# Patient Record
Sex: Female | Born: 1950 | Race: Black or African American | Hispanic: No | Marital: Married | Smoking: Current every day smoker
Health system: Southern US, Community
[De-identification: ages and names within clinical notes are randomized; demographics above are authoritative.]

## PROBLEM LIST (undated history)

## (undated) DIAGNOSIS — I10 Essential (primary) hypertension: Secondary | ICD-10-CM

## (undated) DIAGNOSIS — R0600 Dyspnea, unspecified: Secondary | ICD-10-CM

## (undated) DIAGNOSIS — C801 Malignant (primary) neoplasm, unspecified: Secondary | ICD-10-CM

## (undated) DIAGNOSIS — J189 Pneumonia, unspecified organism: Secondary | ICD-10-CM

## (undated) DIAGNOSIS — K219 Gastro-esophageal reflux disease without esophagitis: Secondary | ICD-10-CM

## (undated) DIAGNOSIS — R87619 Unspecified abnormal cytological findings in specimens from cervix uteri: Secondary | ICD-10-CM

## (undated) DIAGNOSIS — N840 Polyp of corpus uteri: Secondary | ICD-10-CM

## (undated) DIAGNOSIS — N879 Dysplasia of cervix uteri, unspecified: Secondary | ICD-10-CM

## (undated) DIAGNOSIS — E785 Hyperlipidemia, unspecified: Secondary | ICD-10-CM

## (undated) DIAGNOSIS — D649 Anemia, unspecified: Secondary | ICD-10-CM

## (undated) DIAGNOSIS — IMO0002 Reserved for concepts with insufficient information to code with codable children: Secondary | ICD-10-CM

## (undated) DIAGNOSIS — M1990 Unspecified osteoarthritis, unspecified site: Secondary | ICD-10-CM

## (undated) DIAGNOSIS — E669 Obesity, unspecified: Secondary | ICD-10-CM

## (undated) DIAGNOSIS — I219 Acute myocardial infarction, unspecified: Secondary | ICD-10-CM

## (undated) DIAGNOSIS — I509 Heart failure, unspecified: Secondary | ICD-10-CM

## (undated) DIAGNOSIS — N189 Chronic kidney disease, unspecified: Secondary | ICD-10-CM

## (undated) HISTORY — DX: Obesity, unspecified: E66.9

## (undated) HISTORY — DX: Essential (primary) hypertension: I10

## (undated) HISTORY — DX: Reserved for concepts with insufficient information to code with codable children: IMO0002

## (undated) HISTORY — PX: DILATION AND CURETTAGE OF UTERUS: SHX78

## (undated) HISTORY — DX: Chronic kidney disease, unspecified: N18.9

## (undated) HISTORY — PX: LEEP: SHX91

## (undated) HISTORY — DX: Dysplasia of cervix uteri, unspecified: N87.9

## (undated) HISTORY — DX: Polyp of corpus uteri: N84.0

## (undated) HISTORY — PX: HYSTEROSCOPY: SHX211

## (undated) HISTORY — DX: Unspecified abnormal cytological findings in specimens from cervix uteri: R87.619

---

## 1999-02-08 ENCOUNTER — Encounter: Payer: Self-pay | Admitting: Emergency Medicine

## 1999-02-08 ENCOUNTER — Emergency Department (HOSPITAL_COMMUNITY): Admission: EM | Admit: 1999-02-08 | Discharge: 1999-02-08 | Payer: Self-pay | Admitting: Emergency Medicine

## 2003-04-13 ENCOUNTER — Encounter: Admission: RE | Admit: 2003-04-13 | Discharge: 2003-04-13 | Payer: Self-pay | Admitting: Nephrology

## 2004-03-22 ENCOUNTER — Ambulatory Visit (HOSPITAL_COMMUNITY): Admission: RE | Admit: 2004-03-22 | Discharge: 2004-03-22 | Payer: Self-pay | Admitting: Cardiology

## 2004-03-22 ENCOUNTER — Encounter (INDEPENDENT_AMBULATORY_CARE_PROVIDER_SITE_OTHER): Payer: Self-pay | Admitting: Cardiology

## 2004-03-25 ENCOUNTER — Ambulatory Visit (HOSPITAL_COMMUNITY): Admission: RE | Admit: 2004-03-25 | Discharge: 2004-03-25 | Payer: Self-pay | Admitting: Cardiology

## 2004-08-29 ENCOUNTER — Ambulatory Visit (HOSPITAL_COMMUNITY): Admission: RE | Admit: 2004-08-29 | Discharge: 2004-08-29 | Payer: Self-pay | Admitting: Nephrology

## 2006-10-31 ENCOUNTER — Ambulatory Visit: Payer: Self-pay | Admitting: Family Medicine

## 2006-10-31 LAB — CONVERTED CEMR LAB
AST: 14 units/L (ref 0–37)
Albumin: 4.2 g/dL (ref 3.5–5.2)
Basophils Relative: 0 % (ref 0–1)
CO2: 18 meq/L — ABNORMAL LOW (ref 19–32)
Calcium: 9.7 mg/dL (ref 8.4–10.5)
Eosinophils Absolute: 0.1 10*3/uL (ref 0.0–0.7)
Ferritin: 180 ng/mL (ref 10–291)
Glucose, Bld: 94 mg/dL (ref 70–99)
HCT: 42.2 % (ref 36.0–46.0)
HDL: 49 mg/dL (ref >39)
Lymphs Abs: 2 10*3/uL (ref 0.7–3.3)
Monocytes Relative: 6 % (ref 3–11)
Neutrophils Relative %: 62 % (ref 43–77)
Potassium: 5.8 meq/L — ABNORMAL HIGH (ref 3.5–5.3)
TIBC: 297 ug/dL (ref 250–470)
Total Bilirubin: 0.5 mg/dL (ref 0.3–1.2)
Total CHOL/HDL Ratio: 4.5
Triglycerides: 135 mg/dL (ref <150)

## 2006-11-02 ENCOUNTER — Ambulatory Visit (HOSPITAL_COMMUNITY): Admission: RE | Admit: 2006-11-02 | Discharge: 2006-11-02 | Payer: Self-pay | Admitting: Family Medicine

## 2006-11-13 ENCOUNTER — Ambulatory Visit: Payer: Self-pay | Admitting: Family Medicine

## 2006-11-13 LAB — CONVERTED CEMR LAB
Albumin: 3.8 g/dL (ref 3.5–5.2)
BUN: 46 mg/dL — ABNORMAL HIGH (ref 6–23)
Calcium: 9.2 mg/dL (ref 8.4–10.5)
Chloride: 107 meq/L (ref 96–112)
Phosphorus: 4.3 mg/dL (ref 2.3–4.6)
Potassium: 4.6 meq/L (ref 3.5–5.3)

## 2007-02-22 ENCOUNTER — Ambulatory Visit: Payer: Self-pay | Admitting: Family Medicine

## 2007-02-22 ENCOUNTER — Encounter (INDEPENDENT_AMBULATORY_CARE_PROVIDER_SITE_OTHER): Payer: Self-pay | Admitting: Family Medicine

## 2007-03-14 ENCOUNTER — Ambulatory Visit: Payer: Self-pay | Admitting: Internal Medicine

## 2007-03-14 ENCOUNTER — Ambulatory Visit: Payer: Self-pay | Admitting: *Deleted

## 2007-03-14 ENCOUNTER — Encounter (INDEPENDENT_AMBULATORY_CARE_PROVIDER_SITE_OTHER): Payer: Self-pay | Admitting: Family Medicine

## 2007-03-14 LAB — CONVERTED CEMR LAB
Albumin: 3.9 g/dL (ref 3.5–5.2)
BUN: 56 mg/dL — ABNORMAL HIGH (ref 6–23)
CO2: 21 meq/L (ref 19–32)
Calcium, Total (PTH): 9.1 mg/dL (ref 8.4–10.5)
Creatinine, Ser: 2.37 mg/dL — ABNORMAL HIGH (ref 0.40–1.20)
HDL: 53 mg/dL (ref >39)
Hemoglobin: 12.8 g/dL (ref 12.0–15.0)
LDL Cholesterol: 129 mg/dL — ABNORMAL HIGH (ref 0–99)
Lymphocytes Relative: 31 % (ref 12–46)
MCHC: 32.2 g/dL (ref 30.0–36.0)
MCV: 94.3 fL (ref 78.0–100.0)
Platelets: 265 10*3/uL (ref 150–400)
RBC: 4.21 M/uL (ref 3.87–5.11)
RDW: 15.2 % (ref 11.5–15.5)

## 2007-04-09 ENCOUNTER — Ambulatory Visit: Payer: Self-pay | Admitting: Family Medicine

## 2007-09-06 ENCOUNTER — Ambulatory Visit: Payer: Self-pay | Admitting: Vascular Surgery

## 2007-10-10 ENCOUNTER — Ambulatory Visit: Payer: Self-pay | Admitting: Vascular Surgery

## 2007-11-07 ENCOUNTER — Ambulatory Visit: Payer: Self-pay | Admitting: Internal Medicine

## 2007-11-07 ENCOUNTER — Encounter (INDEPENDENT_AMBULATORY_CARE_PROVIDER_SITE_OTHER): Payer: Self-pay | Admitting: Family Medicine

## 2007-11-07 LAB — CONVERTED CEMR LAB
Albumin: 3.9 g/dL (ref 3.5–5.2)
BUN: 37 mg/dL — ABNORMAL HIGH (ref 6–23)
Basophils Absolute: 0 10*3/uL (ref 0.0–0.1)
Basophils Relative: 0 % (ref 0–1)
Calcium, Total (PTH): 9.3 mg/dL (ref 8.4–10.5)
Eosinophils Relative: 1 % (ref 0–5)
HCT: 40.2 % (ref 36.0–46.0)
Iron: 50 ug/dL (ref 42–145)
Lymphocytes Relative: 26 % (ref 12–46)
Lymphs Abs: 1.3 10*3/uL (ref 0.7–4.0)
MCHC: 33.3 g/dL (ref 30.0–36.0)
MCV: 94.4 fL (ref 78.0–100.0)
Monocytes Relative: 12 % (ref 3–12)
Neutro Abs: 3.1 10*3/uL (ref 1.7–7.7)
Neutrophils Relative %: 61 % (ref 43–77)
Potassium: 4.6 meq/L (ref 3.5–5.3)
RBC: 4.26 M/uL (ref 3.87–5.11)
RDW: 14.8 % (ref 11.5–15.5)
Saturation Ratios: 18 % — ABNORMAL LOW (ref 20–55)
Sodium: 137 meq/L (ref 135–145)

## 2008-03-10 ENCOUNTER — Ambulatory Visit: Payer: Self-pay | Admitting: Internal Medicine

## 2008-04-21 ENCOUNTER — Encounter (INDEPENDENT_AMBULATORY_CARE_PROVIDER_SITE_OTHER): Payer: Self-pay | Admitting: Family Medicine

## 2008-04-21 ENCOUNTER — Ambulatory Visit: Payer: Self-pay | Admitting: Internal Medicine

## 2008-04-21 LAB — CONVERTED CEMR LAB
Albumin: 3.8 g/dL (ref 3.5–5.2)
BUN: 36 mg/dL — ABNORMAL HIGH (ref 6–23)
CO2: 17 meq/L — ABNORMAL LOW (ref 19–32)
Creatinine, Ser: 2.28 mg/dL — ABNORMAL HIGH (ref 0.40–1.20)
Iron: 76 ug/dL (ref 42–145)
Phosphorus: 3.6 mg/dL (ref 2.3–4.6)
Sodium: 140 meq/L (ref 135–145)

## 2008-04-23 ENCOUNTER — Ambulatory Visit: Payer: Self-pay | Admitting: Family Medicine

## 2008-04-23 ENCOUNTER — Encounter (INDEPENDENT_AMBULATORY_CARE_PROVIDER_SITE_OTHER): Payer: Self-pay | Admitting: Family Medicine

## 2008-04-30 ENCOUNTER — Ambulatory Visit (HOSPITAL_COMMUNITY): Admission: RE | Admit: 2008-04-30 | Discharge: 2008-04-30 | Payer: Self-pay | Admitting: Family Medicine

## 2008-07-24 ENCOUNTER — Encounter (INDEPENDENT_AMBULATORY_CARE_PROVIDER_SITE_OTHER): Payer: Self-pay | Admitting: Family Medicine

## 2008-07-24 ENCOUNTER — Ambulatory Visit: Payer: Self-pay | Admitting: Family Medicine

## 2008-07-29 ENCOUNTER — Ambulatory Visit: Payer: Self-pay | Admitting: Family Medicine

## 2008-09-29 ENCOUNTER — Ambulatory Visit: Payer: Self-pay | Admitting: Internal Medicine

## 2008-10-08 ENCOUNTER — Ambulatory Visit: Payer: Self-pay | Admitting: Family Medicine

## 2008-10-08 LAB — CONVERTED CEMR LAB
Calcium, Total (PTH): 9.2 mg/dL (ref 8.4–10.5)
Calcium: 9.2 mg/dL (ref 8.4–10.5)
Chloride: 110 meq/L (ref 96–112)
Creatinine, Ser: 2.75 mg/dL — ABNORMAL HIGH (ref 0.40–1.20)
Ferritin: 110 ng/mL (ref 10–291)
HCT: 38.8 % (ref 36.0–46.0)
Hemoglobin: 12.6 g/dL (ref 12.0–15.0)
PTH: 239.2 pg/mL — ABNORMAL HIGH (ref 14.0–72.0)
Saturation Ratios: 29 % (ref 20–55)
UIBC: 191 ug/dL

## 2008-10-14 ENCOUNTER — Ambulatory Visit: Payer: Self-pay | Admitting: Obstetrics and Gynecology

## 2008-10-14 ENCOUNTER — Other Ambulatory Visit: Admission: RE | Admit: 2008-10-14 | Discharge: 2008-10-14 | Payer: Self-pay | Admitting: Obstetrics and Gynecology

## 2008-11-11 ENCOUNTER — Encounter: Payer: Self-pay | Admitting: Obstetrics and Gynecology

## 2008-11-11 ENCOUNTER — Ambulatory Visit: Payer: Self-pay | Admitting: Obstetrics and Gynecology

## 2008-12-02 ENCOUNTER — Ambulatory Visit: Payer: Self-pay | Admitting: Obstetrics and Gynecology

## 2008-12-24 ENCOUNTER — Ambulatory Visit: Payer: Self-pay | Admitting: Obstetrics and Gynecology

## 2008-12-24 ENCOUNTER — Other Ambulatory Visit: Admission: RE | Admit: 2008-12-24 | Discharge: 2008-12-24 | Payer: Self-pay | Admitting: Obstetrics and Gynecology

## 2009-01-05 ENCOUNTER — Ambulatory Visit: Payer: Self-pay | Admitting: Family Medicine

## 2009-01-05 LAB — CONVERTED CEMR LAB
Albumin: 4.6 g/dL (ref 3.5–5.2)
Chloride: 106 meq/L (ref 96–112)
Creatinine, Ser: 3.49 mg/dL — ABNORMAL HIGH (ref 0.40–1.20)
Glucose, Bld: 110 mg/dL — ABNORMAL HIGH (ref 70–99)
HCT: 40.2 % (ref 36.0–46.0)
Iron: 94 ug/dL (ref 42–145)
Potassium: 5.6 meq/L — ABNORMAL HIGH (ref 3.5–5.3)
TIBC: 305 ug/dL (ref 250–470)
UIBC: 211 ug/dL

## 2009-01-13 ENCOUNTER — Ambulatory Visit: Payer: Self-pay | Admitting: Obstetrics & Gynecology

## 2009-02-09 ENCOUNTER — Ambulatory Visit: Payer: Self-pay | Admitting: Internal Medicine

## 2009-02-09 ENCOUNTER — Encounter (INDEPENDENT_AMBULATORY_CARE_PROVIDER_SITE_OTHER): Payer: Self-pay | Admitting: Family Medicine

## 2009-02-09 LAB — CONVERTED CEMR LAB
BUN: 59 mg/dL — ABNORMAL HIGH (ref 6–23)
Calcium: 9.3 mg/dL (ref 8.4–10.5)
Potassium: 4.2 meq/L (ref 3.5–5.3)
Sodium: 138 meq/L (ref 135–145)

## 2009-04-14 ENCOUNTER — Ambulatory Visit: Payer: Self-pay | Admitting: Obstetrics & Gynecology

## 2009-05-05 ENCOUNTER — Ambulatory Visit: Payer: Self-pay | Admitting: Family Medicine

## 2009-05-05 LAB — CONVERTED CEMR LAB
Albumin: 4.1 g/dL (ref 3.5–5.2)
Calcium, Total (PTH): 9.4 mg/dL (ref 8.4–10.5)
Calcium: 9.4 mg/dL (ref 8.4–10.5)
Ferritin: 174 ng/mL (ref 10–291)
Iron: 86 ug/dL (ref 42–145)
Phosphorus: 3.9 mg/dL (ref 2.3–4.6)
Potassium: 4.7 meq/L (ref 3.5–5.3)
Saturation Ratios: 32 % (ref 20–55)

## 2009-08-26 ENCOUNTER — Other Ambulatory Visit: Admission: RE | Admit: 2009-08-26 | Discharge: 2009-08-26 | Payer: Self-pay | Admitting: Obstetrics and Gynecology

## 2009-08-26 ENCOUNTER — Ambulatory Visit: Payer: Self-pay | Admitting: Obstetrics & Gynecology

## 2009-08-31 ENCOUNTER — Ambulatory Visit (HOSPITAL_COMMUNITY): Admission: RE | Admit: 2009-08-31 | Discharge: 2009-08-31 | Payer: Self-pay | Admitting: Family Medicine

## 2009-09-14 ENCOUNTER — Ambulatory Visit: Payer: Self-pay | Admitting: Family Medicine

## 2009-09-14 LAB — CONVERTED CEMR LAB
BUN: 52 mg/dL — ABNORMAL HIGH (ref 6–23)
CO2: 19 meq/L (ref 19–32)
Calcium, Total (PTH): 9.4 mg/dL (ref 8.4–10.5)
Calcium: 9.4 mg/dL (ref 8.4–10.5)
Creatinine, Ser: 2.7 mg/dL — ABNORMAL HIGH (ref 0.40–1.20)
PTH: 216 pg/mL — ABNORMAL HIGH (ref 14.0–72.0)
Saturation Ratios: 28 % (ref 20–55)
TIBC: 258 ug/dL (ref 250–470)

## 2009-10-20 ENCOUNTER — Ambulatory Visit: Payer: Self-pay | Admitting: Obstetrics and Gynecology

## 2009-10-22 ENCOUNTER — Ambulatory Visit: Payer: Self-pay | Admitting: Family Medicine

## 2009-10-22 LAB — CONVERTED CEMR LAB
Albumin: 3.8 g/dL (ref 3.5–5.2)
Basophils Absolute: 0 10*3/uL (ref 0.0–0.1)
Chloride: 107 meq/L (ref 96–112)
Creatinine, Ser: 2.53 mg/dL — ABNORMAL HIGH (ref 0.40–1.20)
Creatinine, Urine: 171.8 mg/dL
Ferritin: 118 ng/mL (ref 10–291)
HCT: 39.6 % (ref 36.0–46.0)
Lymphocytes Relative: 29 % (ref 12–46)
Monocytes Absolute: 0.4 10*3/uL (ref 0.1–1.0)
Neutro Abs: 4.9 10*3/uL (ref 1.7–7.7)
PTH: 148.9 pg/mL — ABNORMAL HIGH (ref 14.0–72.0)
Platelets: 256 10*3/uL (ref 150–400)
Potassium: 4.1 meq/L (ref 3.5–5.3)
RBC: 4.24 M/uL (ref 3.87–5.11)
Saturation Ratios: 16 % — ABNORMAL LOW (ref 20–55)
Sodium: 139 meq/L (ref 135–145)
UIBC: 228 ug/dL
WBC: 7.7 10*3/uL (ref 4.0–10.5)

## 2009-11-05 ENCOUNTER — Ambulatory Visit: Payer: Self-pay | Admitting: Obstetrics & Gynecology

## 2009-11-05 ENCOUNTER — Ambulatory Visit (HOSPITAL_COMMUNITY)
Admission: RE | Admit: 2009-11-05 | Discharge: 2009-11-05 | Payer: Self-pay | Source: Home / Self Care | Admitting: Obstetrics & Gynecology

## 2010-01-20 ENCOUNTER — Encounter (INDEPENDENT_AMBULATORY_CARE_PROVIDER_SITE_OTHER): Payer: Self-pay | Admitting: Family Medicine

## 2010-01-20 LAB — CONVERTED CEMR LAB
ALT: 11 units/L (ref 0–35)
Chloride: 105 meq/L (ref 96–112)
Creatinine, Ser: 2.39 mg/dL — ABNORMAL HIGH (ref 0.40–1.20)
Eosinophils Absolute: 0.1 10*3/uL (ref 0.0–0.7)
Eosinophils Relative: 2 % (ref 0–5)
Ferritin: 133 ng/mL (ref 10–291)
Glucose, Bld: 171 mg/dL — ABNORMAL HIGH (ref 70–99)
Hemoglobin: 13 g/dL (ref 12.0–15.0)
Lymphocytes Relative: 28 % (ref 12–46)
MCV: 92.7 fL (ref 78.0–100.0)
Monocytes Absolute: 0.4 10*3/uL (ref 0.1–1.0)
Neutrophils Relative %: 63 % (ref 43–77)
Phosphorus: 3.4 mg/dL (ref 2.3–4.6)
Platelets: 292 10*3/uL (ref 150–400)
RBC: 4.36 M/uL (ref 3.87–5.11)
RDW: 15.1 % (ref 11.5–15.5)
Sodium: 141 meq/L (ref 135–145)
Total Protein, Urine: 144
WBC: 5.9 10*3/uL (ref 4.0–10.5)

## 2010-03-02 ENCOUNTER — Encounter: Payer: Self-pay | Admitting: Gastroenterology

## 2010-03-02 ENCOUNTER — Ambulatory Visit
Admission: RE | Admit: 2010-03-02 | Discharge: 2010-03-02 | Payer: Self-pay | Source: Home / Self Care | Attending: Obstetrics & Gynecology | Admitting: Obstetrics & Gynecology

## 2010-03-02 ENCOUNTER — Other Ambulatory Visit: Payer: Self-pay | Admitting: Obstetrics and Gynecology

## 2010-03-03 ENCOUNTER — Ambulatory Visit (HOSPITAL_COMMUNITY)
Admission: RE | Admit: 2010-03-03 | Discharge: 2010-03-03 | Payer: Self-pay | Source: Home / Self Care | Attending: Obstetrics & Gynecology | Admitting: Obstetrics & Gynecology

## 2010-03-17 NOTE — Letter (Signed)
Summary: New Patient letter  [facility]  [address] [place], [place] [address]   Phone: [phone]  Fax: [phone]       03/02/2010 MRN: [id number]  Jessica Clarke [address] [place], [place]  [address]  Dear Ms. [name],  Welcome to the Gastroenterology Division at [facility].    You are scheduled to see Dr.  Deatra Ina on 04-08-10 at 1:30pm on the 3rd floor at [facility], [place] [employer].  We ask that you try to arrive at our office 15 minutes prior to your appointment time to allow for check-in.  We would like you to complete the enclosed self-administered evaluation form prior to your visit and bring it with you on the day of your appointment.  We will review it with you.  Also, please bring a complete list of all your medications or, if you prefer, bring the medication bottles and we will list them.  Please bring your insurance card so that we may make a copy of it.  If your insurance requires a referral to see a specialist, please bring your referral form from your primary care physician.  Co-payments are due at the time of your visit and may be paid by cash, check or credit card.     Your office visit will consist of a consult with your physician (includes a physical exam), any laboratory testing he/she may order, scheduling of any necessary diagnostic testing (e.g. x-ray, ultrasound, CT-scan), and scheduling of a procedure (e.g. Endoscopy, Colonoscopy) if required.  Please allow enough time on your schedule to allow for any/all of these possibilities.    If you cannot keep your appointment, please call [phone] to cancel or reschedule prior to your appointment date.  This allows [place] the opportunity to schedule an appointment for another patient in need of care.  If you do not cancel or reschedule by 5 p.m. the business day prior to your appointment date, you will be charged a $50.00 late cancellation/no-show fee.    Thank you for choosing [facility] for your medical needs.  We appreciate the opportunity to care for you.  Please visit [place] at our website  to learn more about our practice.                     Sincerely,                                                             The Gastroenterology Division

## 2010-03-17 NOTE — Progress Notes (Unsigned)
NAME:  Jessica Clarke, Jessica Clarke NO.:  [id number]  MEDICAL RECORD NO.:  [id number]          PATIENT TYPE:  WOC  LOCATION:  [facility]                   FACILITY:  [facility]  PHYSICIAN:  Andrew Au, MD        DATE OF BIRTH:  20-May-1950  DATE OF SERVICE:  03/02/2010                                 CLINIC NOTE  The patient is a 59 year old female who underwent a cervical cone removal of the uterine polyp and hysteroscopy with a D and C in September 2011.  This is the first time she is back for her postop visit.  She has no complaints at the present time and postoperative exam was normal for pathology, so Pap smear was repeated which was negative. There were no endocervical glands, but that is not unusual 4 months after a conization.  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  We are going to repeat the Pap smear again in 6 months.  IMPRESSION:  Postop exam satisfactory.          ______________________________ Andrew Au, MD    PR/MEDQ  D:  03/16/2010  T:  03/16/2010  Job:  [id number]

## 2010-04-08 ENCOUNTER — Ambulatory Visit: Payer: Self-pay | Admitting: Gastroenterology

## 2010-04-28 LAB — CBC
MCH: 31.2 pg (ref 26.0–34.0)
MCHC: 33.8 g/dL (ref 30.0–36.0)
Platelets: 301 10*3/uL (ref 150–400)
RBC: 4.14 MIL/uL (ref 3.87–5.11)
RDW: 14.3 % (ref 11.5–15.5)

## 2010-04-28 LAB — BASIC METABOLIC PANEL
CO2: 22 mEq/L (ref 19–32)
Creatinine, Ser: 2.72 mg/dL — ABNORMAL HIGH (ref 0.4–1.2)
GFR calc Af Amer: 22 mL/min — ABNORMAL LOW (ref >60)
Potassium: 4.5 mEq/L (ref 3.5–5.1)
Sodium: 137 mEq/L (ref 135–145)

## 2010-05-09 ENCOUNTER — Ambulatory Visit (INDEPENDENT_AMBULATORY_CARE_PROVIDER_SITE_OTHER): Payer: Medicaid Other | Admitting: Gastroenterology

## 2010-05-09 ENCOUNTER — Encounter: Payer: Self-pay | Admitting: Gastroenterology

## 2010-05-09 DIAGNOSIS — K5909 Other constipation: Secondary | ICD-10-CM

## 2010-05-09 DIAGNOSIS — K5900 Constipation, unspecified: Secondary | ICD-10-CM | POA: Insufficient documentation

## 2010-05-09 DIAGNOSIS — R198 Other specified symptoms and signs involving the digestive system and abdomen: Secondary | ICD-10-CM

## 2010-05-09 MED ORDER — PEG-KCL-NACL-NASULF-NA ASC-C 100 G PO SOLR: 1.0000 | Freq: Once | ORAL | Status: AC

## 2010-05-09 NOTE — Assessment & Plan Note (Signed)
Patient has relatively new onset constipation. This is most likely functional. A structural abnormality of the colon should be ruled out.  Recommendations #1 colonoscopy  Risks, alternatives, and complications of the procedure, including bleeding, perforation, and possible need for surgery, were explained to the patient.  Patient's questions were answered.

## 2010-05-09 NOTE — Patient Instructions (Signed)
Colonoscopy A colonoscopy is an exam to evaluate your entire colon. In this exam, your colon is cleansed. A long fiberoptic tube is inserted through your rectum and into your colon. The fiberoptic scope (endoscope) is a long bundle of enclosed and very flexible fibers. These fibers transmit light to the area examined and send images from that area to your caregiver. Discomfort is usually minimal. You may be given a drug to help you sleep (sedative) during or prior to the procedure. This exam helps to detect lumps (tumors), polyps, inflammation, and areas of bleeding. Your caregiver may also take a small piece of tissue (biopsy) that will be examined under a microscope. BEFORE THE PROCEDURE  A clear liquid diet may be required for 2 days before the exam.   Liquid injections (enemas) or laxatives may be required.   A large amount of electrolyte solution may be given to you to drink over a short period of time. This solution is used to clean out your colon.   You should be present 1 prior to your procedure or as directed by your caregiver.   Check in at the admissions desk to fill out necessary forms if not preregistered. There will be consent forms to sign prior to the procedure. If accompanied by friends or family, there is a waiting area for them while you are having your procedure.  LET YOUR CAREGIVER KNOW ABOUT:  Allergies to food or medicine.  Medicines taken, including vitamins, herbs, eyedrops, over-the-counter medicines, and creams.   Use of steroids (by mouth or creams).   Previous problems with anesthetics or numbing medicines.   History of bleeding problems or blood clots.  Previous surgery.   Other health problems, including diabetes and kidney problems.   Possibility of pregnancy, if this applies.   AFTER THE PROCEDURE  If you received a sedative and/or pain medicine, you will need to arrange for someone to drive you home.   Occasionally, there is a little blood passed  with the first bowel movement. DO NOT be concerned.  HOME CARE INSTRUCTIONS  It is not unusual to pass moderate amounts of gas and experience mild abdominal cramping following the procedure. This is due to air being used to inflate your colon during the exam. Walking or a warm pack on your belly (abdomen) may help.   You may resume all normal meals and activities after sedatives and medicines have worn off.   Only take over-the-counter or prescription medicines for pain, discomfort, or fever as directed by your caregiver. DO NOT use aspirin or blood thinners if a biopsy was taken. Consult your caregiver for medicine usage if biopsies were taken.  FINDING OUT THE RESULTS OF YOUR TEST Not all test results are available during your visit. If your test results are not back during the visit, make an appointment with your caregiver to find out the results. Do not assume everything is normal if you have not heard from your caregiver or the medical facility. It is important for you to follow up on all of your test results. SEEK IMMEDIATE MEDICAL CARE IF: You pass large blood clots or fill a toilet with blood following the procedure. This may also occur 10 to 14 days following the procedure. This is more likely if a biopsy was taken.   You develop abdominal pain that keeps getting worse and cannot be relieved with medicine.  Document Released: 01/28/2000 Document Re-Released: 04/26/2009 [facility] Patient Information 2011 [place]. Your Colonoscopy is scheduled on 05/24/2010 at 11:30am  You can pick up your MoviPrep from your pharmacy today

## 2010-05-09 NOTE — Progress Notes (Signed)
History of Present Illness:  Jessica Clarke  Is a 60 year old Afro-American female referred at the request of Dr. Leward Quan for evaluation of change in bowel habits. For the past year she's been complaining of constipation. She typically has a bowel movement once every 3 days compared to daily bowel movements previously. She has abdominal bloating.  The patient also complains of occasional pyrosis.    Review of Systems:  She complains of lower back pain. Pertinent positive and negative review of systems were noted in the above HPI section. All other review of systems were otherwise negative.    Current Medications, Allergies, Past Medical History, Past Surgical History, Family History and Social History were reviewed in [facility] record  Vital signs were reviewed in today's medical record. Physical Exam: General: Well developed , well nourished, no acute distress Head: Normocephalic and atraumatic Eyes:  sclerae anicteric, EOMI Ears: Normal auditory acuity Mouth: No deformity or lesions Lungs: Clear throughout to auscultation Heart: Regular rate and rhythm; no murmurs, rubs or bruits Abdomen: Soft, non tender and non distended. No masses, hepatosplenomegaly or hernias noted. Normal Bowel sounds Rectal:deferred Musculoskeletal: Symmetrical with no gross deformities  Pulses:  Normal pulses noted Extremities: No clubbing, cyanosis, edema or deformities noted Neurological: Alert oriented x 4, grossly nonfocal Psychological:  Alert and cooperative. Normal mood and affect

## 2010-05-19 NOTE — Progress Notes (Signed)
Addended by: Erskine Emery MD on: 05/19/2010 10:39 AM   Modules accepted: Orders

## 2010-05-24 ENCOUNTER — Ambulatory Visit (AMBULATORY_SURGERY_CENTER): Payer: Medicaid Other | Admitting: Gastroenterology

## 2010-05-24 ENCOUNTER — Encounter: Payer: Self-pay | Admitting: Gastroenterology

## 2010-05-24 DIAGNOSIS — D1779 Benign lipomatous neoplasm of other sites: Secondary | ICD-10-CM

## 2010-05-24 DIAGNOSIS — K620 Anal polyp: Secondary | ICD-10-CM

## 2010-05-24 DIAGNOSIS — R198 Other specified symptoms and signs involving the digestive system and abdomen: Secondary | ICD-10-CM

## 2010-05-24 DIAGNOSIS — D126 Benign neoplasm of colon, unspecified: Secondary | ICD-10-CM

## 2010-05-24 DIAGNOSIS — K5730 Diverticulosis of large intestine without perforation or abscess without bleeding: Secondary | ICD-10-CM

## 2010-05-24 MED ORDER — SODIUM CHLORIDE 0.9 % IV SOLN: 500.0000 mL | INTRAVENOUS | Status: DC

## 2010-05-24 NOTE — Patient Instructions (Signed)
Polyps, Colon  A polyp is extra tissue that grows inside your body. Colon polyps grow in the large intestine. The large intestine, also called the colon, is part of your digestive system. It is a long, hollow tube at the end of your digestive tract where your body makes and stores stool. Most polyps are not dangerous. They are benign. This means they are not cancerous. But over time, some types of polyps can turn into cancer. Polyps that are smaller than a pea are usually not harmful. But larger polyps could someday become or may already be cancerous. To be safe, doctors remove all polyps and test them.  WHO GETS POLYPS? Anyone can get polyps, but certain people are more likely than others. You may have a greater chance of getting polyps if:  You are over 50.   You have had polyps before.   Someone in your family has had polyps.   Someone in your family has had cancer of the large intestine.   Find out if someone in your family has had polyps. You may also be more likely to get polyps if you:   Eat a lot of fatty foods   Smoke   Drink alcohol   Do not exercise  Eat too much  SYMPTOMS Most small polyps do not cause symptoms. People often do not know they have one until their caregiver finds it during a regular checkup or while testing them for something else. Some people do have symptoms like these:  Bleeding from the anus. You might notice blood on your underwear or on toilet paper after you have had a bowel movement.   Constipation or diarrhea that lasts more than a week.   Blood in the stool. Blood can make stool look black or it can show up as red streaks in the stool.  If you have any of these symptoms, see your caregiver. HOW DOES THE DOCTOR TEST FOR POLYPS? The doctor can use four tests to check for polyps:  Digital rectal exam. The caregiver wears gloves and checks your rectum (the last part of the large intestine) to see if it feels normal. This test would find polyps only  in the rectum. Your caregiver may need to do one of the other tests listed below to find polyps higher up in the intestine.   Barium enema. The caregiver puts a liquid called barium into your rectum before taking x-rays of your large intestine. Barium makes your intestine look white in the pictures. Polyps are dark, so they are easy to see.   Sigmoidoscopy. With this test, the caregiver can see inside your large intestine. A thin flexible tube is placed into your rectum. The device is called a sigmoidoscope, which has a light and a tiny video camera in it. The caregiver uses the sigmoidoscope to look at the last third of your large intestine.   Colonoscopy. This test is like sigmoidoscopy, but the caregiver looks at all of the large intestine. It usually requires sedation. This is the most common method for finding and removing polyps.  TREATMENT  The caregiver will remove the polyp during sigmoidoscopy or colonoscopy. The polyp is then tested for cancer.   If you have had polyps, your caregiver may want you to get tested regularly in the future.  PREVENTION There is not one sure way to prevent polyps. You might be able to lower your risk of getting them if you:  Eat more fruits and vegetables and less fatty food.     Do not smoke.   Avoid alcohol.   Exercise every day.   Lose weight if you are overweight.   Eating more calcium and folate can also lower your risk of getting polyps. Some foods that are rich in calcium are milk, cheese, and broccoli. Some foods that are rich in folate are chickpeas, kidney beans, and spinach.   Aspirin might help prevent polyps. Studies are under way.  Document Released: 10/27/2003 Document Re-Released: 07/20/2009 [facility] Patient Information 2011 [place]  .Diverticulosis Diverticulosis is a common condition that develops when small pouches (diverticula) form in the wall of the colon. The risk of diverticulosis increases with age. It happens more  often in people who eat a low-fiber diet. Most individuals with diverticulosis have no symptoms. Those individuals with symptoms usually experience belly (abdominal) pain, constipation, or loose stools (diarrhea). HOME CARE INSTRUCTIONS  Increase the amount of fiber in your diet as directed by your caregiver or dietician. This may reduce symptoms of diverticulosis.   Your caregiver may recommend taking a dietary fiber supplement.   Drink at least 6 to 8 glasses of water each day to prevent constipation.   Try not to strain when you have a bowel movement.   Your caregiver may recommend avoiding nuts and seeds to prevent complications, although this is still an uncertain benefit.   Only take over-the-counter or prescription medicines for pain, discomfort, or fever as directed by your caregiver.  FOODS HAVING HIGH FIBER CONTENT INCLUDE:  Fruits. Apple, peach, pear, tangerine, raisins, prunes.   Vegetables. Brussels sprouts, asparagus, broccoli, cabbage, carrot, cauliflower, romaine lettuce, spinach, summer squash, tomato, winter squash, zucchini.   Starchy Vegetables. Baked beans, kidney beans, lima beans, split peas, lentils, potatoes (with skin).   Grains. Whole wheat bread, brown rice, bran flake cereal, plain oatmeal, white rice, shredded wheat, bran muffins.  SEEK IMMEDIATE MEDICAL CARE IF:  You develop increasing pain or severe bloating.   You have an oral temperature above 100, not controlled by medicine.   You develop vomiting or bowel movements that are bloody or black.  Document Released: 10/28/2003 Document Re-Released: 07/20/2009 [facility] Patient Information 2011 [place].

## 2010-05-25 ENCOUNTER — Telehealth: Payer: Self-pay | Admitting: *Deleted

## 2010-05-25 NOTE — Telephone Encounter (Signed)
Number listed with recording that the number has been disconnected.

## 2010-06-02 ENCOUNTER — Encounter: Payer: Self-pay | Admitting: Gastroenterology

## 2010-06-28 NOTE — Group Therapy Note (Signed)
Jessica Clarke, Jessica Clarke NO.:  [id number]   MEDICAL RECORD NO.:  [id number]          PATIENT TYPE:  WOC   LOCATION:  [facility]                   FACILITY[facility]  [facility]   PHYSICIAN:  Lindaann Slough, CNM  DATE OF BIRTH:  1950/11/24   DATE OF SERVICE:                                  CLINIC NOTE   REASON FOR VISIT:  Referred by Dr. Leward Quan at [facility] for a  colposcopy.   Jessica Clarke is a 60 year old African American woman is here with a medical  history of hypertension, chronic kidney disease, and gout being followed  by Dr. Leward Quan.   MEDICATIONS:  Atenolol, Lasix, clonidine, colchicine, and calcitriol.   MENSTRUAL HISTORY:  She had menopause at 61 years of age.   OBSTETRICAL HISTORY:  History of 2 pregnancies with 1 vaginal delivery  with 1 child living and 1 history of termination.   GYNECOLOGICAL HISTORY:  This is a first known abnormal Pap smear for the  patient with a ASCUS Pap on June 2010 and with positive HPV reflex.   PAST SURGICAL HISTORY:  None.   FAMILY HISTORY:  Diabetes in mother and sisters, high blood pressure in  sisters, and cancer in sister.   SOCIAL HISTORY:  Smoked approximately 15 cigarettes per day x30 years,  drinks some beer daily.   REVIEW OF SYSTEMS:  Weight gain, problems with urine, occasional hot  flashes, and intermittent spotting.   PHYSICAL EXAMINATION:  VITAL SIGNS:  Stable.  Blood pressure 126/85,  temperature 97.4, pulse 77, and respirations 16.  GENITAL:  The cervix was well visualized.  On the cervix, there was  lesion at approximately 9 o'clock and cervical polyps with bleeding  around the polyps.  A well visualized polyp removed without difficulty.  Scant bleeding around the polyp and the tissue was obtained without  difficulty.  The patient tolerated the procedure without any difficulty.  Bleeding was stopped with pressure with large swabs and small amount of  Monsels were applied to the cervix.   ASSESSMENT:   Atypical squamous cells of undetermined significance Pap  and cervical polyp.   PLAN:  The patient is to follow up in 4 weeks for reevaluation and  repeat Pap smear and possible colposcopies.  Consulted with Dr. Gala Romney  and agrees with plan.      Lindaann Slough, CNM     WM/MEDQ  D:  10/14/2008  T:  10/15/2008  Job:  [id number]

## 2010-09-09 DIAGNOSIS — N950 Postmenopausal bleeding: Secondary | ICD-10-CM

## 2010-09-09 DIAGNOSIS — IMO0001 Reserved for inherently not codable concepts without codable children: Secondary | ICD-10-CM | POA: Insufficient documentation

## 2010-09-09 DIAGNOSIS — F17200 Nicotine dependence, unspecified, uncomplicated: Secondary | ICD-10-CM

## 2010-09-09 DIAGNOSIS — I10 Essential (primary) hypertension: Secondary | ICD-10-CM | POA: Insufficient documentation

## 2010-09-09 DIAGNOSIS — M109 Gout, unspecified: Secondary | ICD-10-CM | POA: Insufficient documentation

## 2010-09-09 DIAGNOSIS — N189 Chronic kidney disease, unspecified: Secondary | ICD-10-CM

## 2010-09-09 DIAGNOSIS — N841 Polyp of cervix uteri: Secondary | ICD-10-CM | POA: Insufficient documentation

## 2010-09-23 ENCOUNTER — Other Ambulatory Visit (HOSPITAL_COMMUNITY)
Admission: RE | Admit: 2010-09-23 | Discharge: 2010-09-23 | Disposition: A | Discharge status: Home / Self Care | Payer: Medicaid Other | Source: Ambulatory Visit | Attending: Obstetrics & Gynecology | Admitting: Obstetrics & Gynecology

## 2010-09-23 ENCOUNTER — Encounter: Payer: Self-pay | Admitting: Obstetrics & Gynecology

## 2010-09-23 ENCOUNTER — Ambulatory Visit (INDEPENDENT_AMBULATORY_CARE_PROVIDER_SITE_OTHER): Payer: Medicaid Other | Admitting: Obstetrics & Gynecology

## 2010-09-23 DIAGNOSIS — R87619 Unspecified abnormal cytological findings in specimens from cervix uteri: Secondary | ICD-10-CM | POA: Insufficient documentation

## 2010-09-23 DIAGNOSIS — N950 Postmenopausal bleeding: Secondary | ICD-10-CM

## 2010-09-23 DIAGNOSIS — N840 Polyp of corpus uteri: Secondary | ICD-10-CM | POA: Insufficient documentation

## 2010-09-23 DIAGNOSIS — Z01419 Encounter for gynecological examination (general) (routine) without abnormal findings: Secondary | ICD-10-CM | POA: Insufficient documentation

## 2010-09-23 DIAGNOSIS — N871 Moderate cervical dysplasia: Secondary | ICD-10-CM

## 2010-09-23 NOTE — Progress Notes (Signed)
  Subjective:    Patient ID: Jessica Clarke, female    DOB: [birth year], 60 y.o.   MRN: [id number]  HPI 60 yo postmenopausal woman here for followup.  She underwent a cervical cone for HGSIL, and removal of the uterine polyp with hysteroscopy and D and C in  September 2011 by Dr. Hulan Fray. Pathology returned as CIN II, benign endometrial polyp. She then underwent a pap smear in 02/2010 which was negative, but had absent endocervical component.  She reports continued PMB that started in April 2012.  She reports small amount of bleeding that lasts about 3 days, about 1-2 times month, sometimes associated with abdominal cramping.  No other symptoms.  The following portions of the patient's history were reviewed and updated as appropriate: allergies, current medications, past family history, past medical history, past social history, past surgical history and problem list.  Review of Systems Negative    Objective:   Physical Exam Weight 196 lbs, Height 5'11" Filed Vitals:   09/23/10 1155  BP: 144/94  Pulse: 72  Temp: 97.8 F (36.6 C)  Pelvic: NEFG. No lesions. Mild atrophy.  Counseled regarding need for endometrial biopsy.  ENDOMETRIAL BIOPSY     The indications for endometrial biopsy were reviewed.   Risks of the biopsy including cramping, bleeding, infection, uterine perforation, inadequate specimen and need for additional procedures  were discussed. The patient states she understands and agrees to undergo procedure today. Consent was signed. Time out was performed. Urine HCG was negative. A sterile speculum was placed in the patient's vagina and the cervix was prepped with Betadine. A single-toothed tenaculum was placed on the anterior lip of the cervix to stabilize it. The 3 mm pipelle was introduced into the endometrial cavity without difficulty to a depth of 7 cm, 2 passes were made.  A  moderate amount of tissue was sent to pathology. The instruments were removed from the patient's vagina.  Minimal bleeding from the cervix was noted. The patient tolerated the procedure well.  Routine post-procedure instructions were given to the patient. The patient will follow up in about two weeks to review the results and for further management.      Assessment & Plan:  -Follow up biopsy results and manage accordingly.  Bleeding precautions reviewed. -Pelvic ultrasound ordered, will follow up results -Will follow up pap smear; patient needs pap smears every six months until she has two consecutive normal pap smears, then she can resume annual screening.  If any pap smear is abnormal, she will need repeat colposcopy and appropriate evaluation.  [place] A 09/23/2010 1:02 PM

## 2010-09-27 ENCOUNTER — Ambulatory Visit (HOSPITAL_COMMUNITY)
Admission: RE | Admit: 2010-09-27 | Discharge: 2010-09-27 | Disposition: A | Discharge status: Home / Self Care | Payer: Medicaid Other | Source: Ambulatory Visit | Attending: Obstetrics & Gynecology | Admitting: Obstetrics & Gynecology

## 2010-09-27 DIAGNOSIS — N950 Postmenopausal bleeding: Secondary | ICD-10-CM

## 2010-09-27 DIAGNOSIS — D252 Subserosal leiomyoma of uterus: Secondary | ICD-10-CM | POA: Insufficient documentation

## 2011-03-22 DIAGNOSIS — D509 Iron deficiency anemia, unspecified: Secondary | ICD-10-CM | POA: Diagnosis not present

## 2011-03-22 DIAGNOSIS — I10 Essential (primary) hypertension: Secondary | ICD-10-CM | POA: Diagnosis not present

## 2011-03-22 DIAGNOSIS — N2581 Secondary hyperparathyroidism of renal origin: Secondary | ICD-10-CM | POA: Diagnosis not present

## 2011-03-22 DIAGNOSIS — N184 Chronic kidney disease, stage 4 (severe): Secondary | ICD-10-CM | POA: Diagnosis not present

## 2011-03-22 DIAGNOSIS — N19 Unspecified kidney failure: Secondary | ICD-10-CM | POA: Diagnosis not present

## 2011-03-22 DIAGNOSIS — F17200 Nicotine dependence, unspecified, uncomplicated: Secondary | ICD-10-CM | POA: Diagnosis not present

## 2011-03-28 DIAGNOSIS — N184 Chronic kidney disease, stage 4 (severe): Secondary | ICD-10-CM | POA: Diagnosis not present

## 2011-03-28 DIAGNOSIS — I129 Hypertensive chronic kidney disease with stage 1 through stage 4 chronic kidney disease, or unspecified chronic kidney disease: Secondary | ICD-10-CM | POA: Diagnosis not present

## 2011-03-28 DIAGNOSIS — R809 Proteinuria, unspecified: Secondary | ICD-10-CM | POA: Diagnosis not present

## 2011-03-28 DIAGNOSIS — F17200 Nicotine dependence, unspecified, uncomplicated: Secondary | ICD-10-CM | POA: Diagnosis not present

## 2011-06-23 DIAGNOSIS — N2581 Secondary hyperparathyroidism of renal origin: Secondary | ICD-10-CM | POA: Diagnosis not present

## 2011-06-23 DIAGNOSIS — N184 Chronic kidney disease, stage 4 (severe): Secondary | ICD-10-CM | POA: Diagnosis not present

## 2011-06-23 DIAGNOSIS — D509 Iron deficiency anemia, unspecified: Secondary | ICD-10-CM | POA: Diagnosis not present

## 2011-06-23 DIAGNOSIS — I10 Essential (primary) hypertension: Secondary | ICD-10-CM | POA: Diagnosis not present

## 2011-09-05 DIAGNOSIS — N2581 Secondary hyperparathyroidism of renal origin: Secondary | ICD-10-CM | POA: Diagnosis not present

## 2011-09-05 DIAGNOSIS — N184 Chronic kidney disease, stage 4 (severe): Secondary | ICD-10-CM | POA: Diagnosis not present

## 2011-09-05 DIAGNOSIS — D509 Iron deficiency anemia, unspecified: Secondary | ICD-10-CM | POA: Diagnosis not present

## 2011-09-05 DIAGNOSIS — I10 Essential (primary) hypertension: Secondary | ICD-10-CM | POA: Diagnosis not present

## 2011-11-03 DIAGNOSIS — N2581 Secondary hyperparathyroidism of renal origin: Secondary | ICD-10-CM | POA: Diagnosis not present

## 2011-11-03 DIAGNOSIS — D509 Iron deficiency anemia, unspecified: Secondary | ICD-10-CM | POA: Diagnosis not present

## 2011-11-03 DIAGNOSIS — N184 Chronic kidney disease, stage 4 (severe): Secondary | ICD-10-CM | POA: Diagnosis not present

## 2011-11-03 DIAGNOSIS — I10 Essential (primary) hypertension: Secondary | ICD-10-CM | POA: Diagnosis not present

## 2011-11-10 DIAGNOSIS — I10 Essential (primary) hypertension: Secondary | ICD-10-CM | POA: Diagnosis not present

## 2011-11-10 DIAGNOSIS — N184 Chronic kidney disease, stage 4 (severe): Secondary | ICD-10-CM | POA: Diagnosis not present

## 2011-11-10 DIAGNOSIS — Z23 Encounter for immunization: Secondary | ICD-10-CM | POA: Diagnosis not present

## 2011-11-10 DIAGNOSIS — D509 Iron deficiency anemia, unspecified: Secondary | ICD-10-CM | POA: Diagnosis not present

## 2011-11-10 DIAGNOSIS — N2581 Secondary hyperparathyroidism of renal origin: Secondary | ICD-10-CM | POA: Diagnosis not present

## 2011-11-30 ENCOUNTER — Other Ambulatory Visit: Payer: Self-pay

## 2011-11-30 DIAGNOSIS — N184 Chronic kidney disease, stage 4 (severe): Secondary | ICD-10-CM

## 2011-11-30 DIAGNOSIS — Z01810 Encounter for preprocedural cardiovascular examination: Secondary | ICD-10-CM

## 2011-12-26 ENCOUNTER — Encounter: Payer: Self-pay | Admitting: Vascular Surgery

## 2011-12-27 ENCOUNTER — Encounter: Payer: Self-pay | Admitting: Vascular Surgery

## 2011-12-27 ENCOUNTER — Encounter (INDEPENDENT_AMBULATORY_CARE_PROVIDER_SITE_OTHER): Payer: Medicare Other | Admitting: *Deleted

## 2011-12-27 ENCOUNTER — Ambulatory Visit (INDEPENDENT_AMBULATORY_CARE_PROVIDER_SITE_OTHER): Payer: Medicaid Other | Admitting: Vascular Surgery

## 2011-12-27 VITALS — BP 132/88 | HR 64 | Ht 71.0 in | Wt 190.0 lb

## 2011-12-27 DIAGNOSIS — Z01810 Encounter for preprocedural cardiovascular examination: Secondary | ICD-10-CM

## 2011-12-27 DIAGNOSIS — N186 End stage renal disease: Secondary | ICD-10-CM | POA: Insufficient documentation

## 2011-12-27 DIAGNOSIS — N184 Chronic kidney disease, stage 4 (severe): Secondary | ICD-10-CM

## 2011-12-27 NOTE — Progress Notes (Signed)
Vascular and Vein Specialist of [facility]  Patient name: Jessica Clarke MRN: [id number] DOB: 24-Apr-1950 Sex: female  REASON FOR CONSULT: evaluate for AV fistula. Referred by Dr. Marval Regal  HPI: Jessica Clarke is a 61 y.o. female with stage IV chronic kidney disease. She is not yet on dialysis. She was referred for evaluation for a fistula only. She denies any recent uremic symptoms. Specifically she denies shortness of breath, fatigue, anorexia, or palpitations.  I have reviewed her records from [facility]. She does have hypertension which has improved recently. She also has anemia of chronic disease and secondary hyperparathyroidism.  Past Medical History  Diagnosis Date  . Obesity   . Uterine polyp   . Cervical dysplasia   . Hypertension   . Chronic kidney disease   . Constipation   . Gout   . Constipation   . Abnormal Pap smear     ASCUS ?HGSIL    Family History  Problem Relation Age of Onset  . Colon cancer Neg Hx   . Esophageal cancer    . Pancreatic cancer    . Heart disease Mother   . Hypertension Mother   . Breast cancer Sister   . Esophageal cancer Sister   . Cancer Sister   . Deep vein thrombosis Sister   . Diabetes Sister   . Hypertension Sister   . Hypertension Daughter     SOCIAL HISTORY: History  Substance Use Topics  . Smoking status: Current Every Day Smoker -- 0.5 packs/day for 30 years    Types: Cigarettes  . Smokeless tobacco: Never Used  . Alcohol Use: 1.2 oz/week    2 Cans of beer per week    No Known Allergies  Current Outpatient Prescriptions  Medication Sig Dispense Refill  . allopurinol (ZYLOPRIM) 100 MG tablet Take 100 mg by mouth daily.        Marland Kitchen atenolol (TENORMIN) 100 MG tablet Take 100 mg by mouth daily.        . cloNIDine (CATAPRES) 0.2 MG tablet Take 0.2 mg by mouth 2 (two) times daily.        . furosemide (LASIX) 40 MG tablet Take 40 mg by mouth 2 (two) times daily.        Marland Kitchen loratadine (CLARITIN) 10 MG tablet  Take 10 mg by mouth daily. As needed       . HYDROcodone-acetaminophen (VICODIN) 5-500 MG per tablet Take 1 tablet by mouth. As needed      . lubiprostone (AMITIZA) 24 MCG capsule Take 24 mcg by mouth 2 (two) times daily with a meal.        Current Facility-Administered Medications  Medication Dose Route Frequency Provider Last Rate Last Dose  . 0.9 %  sodium chloride infusion  500 mL Intravenous Continuous Inda Castle, MD        REVIEW OF SYSTEMS: Valu.Nieves ] denotes positive finding; [  ] denotes negative finding  CARDIOVASCULAR:  [ ]  chest pain   [ ]  chest pressure   [ ]  palpitations   [ ]  orthopnea   [ ]  dyspnea on exertion   [ ]  claudication   [ ]  rest pain   [ ]  DVT   [ ]  phlebitis PULMONARY:   [ ]  productive cough   [ ]  asthma   [ ]  wheezing NEUROLOGIC:   [ ]  weakness  [ ]  paresthesias  [ ]  aphasia  [ ]  amaurosis  [ ]  dizziness HEMATOLOGIC:   [ ]  bleeding problems   [ ]   clotting disorders MUSCULOSKELETAL:  [ ]  joint pain   [ ]  joint swelling [ ]  leg swelling GASTROINTESTINAL: [ ]   blood in stool  [ ]   hematemesis GENITOURINARY:  [ ]   dysuria  [ ]   hematuria PSYCHIATRIC:  [ ]  history of major depression INTEGUMENTARY:  [ ]  rashes  [ ]  ulcers CONSTITUTIONAL:  [ ]  fever   [ ]  chills  PHYSICAL EXAM: Filed Vitals:   12/27/11 1028  BP: 132/88  Pulse: 64  Height: 5\' 11"  (1.803 m)  Weight: 190 lb (86.183 kg)  SpO2: 100%   Body mass index is 26.50 kg/(m^2). GENERAL: The patient is a well-nourished female, in no acute distress. The vital signs are documented above. CARDIOVASCULAR: There is a regular rate and rhythm. I cannot palpate a right radial pulse. She does have a palpable left brachial and left radial pulse. PULMONARY: There is good air exchange bilaterally without wheezing or rales. ABDOMEN: Soft and non-tender with normal pitched bowel sounds.  MUSCULOSKELETAL: There are no major deformities or cyanosis. NEUROLOGIC: No focal weakness or paresthesias are detected. SKIN: There  are no ulcers or rashes noted. PSYCHIATRIC: The patient has a normal affect.  DATA:  I have independently interpreted her vein mapping today. This shows that her forearm cephalic vein is very small. The upper arm cephalic vein appears marginal in size but appears to be her best option for a fistula. The basilic vein likewise is fairly small.  MEDICAL ISSUES: I've recommended that we place a left brachiocephalic AV fistula.I have explained the indications for placement of an AV fistula. I have reviewed the risks of placement of an AV fistula including but not limited to: failure of the fistula to mature, need for subsequent interventions, and thrombosis.  All the patient's questions were answered and they are agreeable to proceed with surgery. She would like to schedule her surgery on December 5 as she needs to work around her daughter's school schedule.  [place] [facility] of [place] Beeper: [phone]

## 2012-01-02 ENCOUNTER — Other Ambulatory Visit: Payer: Self-pay | Admitting: *Deleted

## 2012-01-08 ENCOUNTER — Encounter (HOSPITAL_COMMUNITY): Payer: Self-pay | Admitting: Pharmacy Technician

## 2012-01-17 ENCOUNTER — Encounter (HOSPITAL_COMMUNITY): Payer: Self-pay

## 2012-01-17 ENCOUNTER — Encounter (HOSPITAL_COMMUNITY)
Admission: RE | Admit: 2012-01-17 | Discharge: 2012-01-17 | Payer: Medicare Other | Source: Ambulatory Visit | Attending: Vascular Surgery | Admitting: Vascular Surgery

## 2012-01-17 MED ORDER — DEXTROSE 5 % IV SOLN
1.5000 g | INTRAVENOUS | Status: AC
  Administered 2012-01-18: 1.5 g via INTRAVENOUS
  Filled 2012-01-17: qty 1.5

## 2012-01-17 MED ORDER — SODIUM CHLORIDE 0.9 % IV SOLN: INTRAVENOUS | Status: DC

## 2012-01-17 NOTE — Pre-Procedure Instructions (Signed)
[place]  01/17/2012   Your procedure is scheduled on:  01/18/12  Report to [place] at 730 AM.  Call this number if you have problems the morning of surgery: [phone]   Remember:   Do not eat food:After Midnight.     Take these medicines the morning of surgery with A SIP OF WATER: allopurinol,atenolol,vicodan,claritin,clondine   Do not wear jewelry, make-up or nail polish.  Do not wear lotions, powders, or perfumes. You may wear deodorant.  Do not shave 48 hours prior to surgery. Men may shave face and neck.  Do not bring valuables to the hospital.  Contacts, dentures or bridgework may not be worn into surgery.  Leave suitcase in the car. After surgery it may be brought to your room.  For patients admitted to the hospital, checkout time is 11:00 AM the day of discharge.   Patients discharged the day of surgery will not be allowed to drive home.  Name and phone number of your driver: family  Special Instructions: Shower using CHG 2 nights before surgery and the night before surgery.  If you shower the day of surgery use CHG.  Use special wash - you have one bottle of CHG for all showers.  You should use approximately 1/3 of the bottle for each shower.   Please read over the following fact sheets that you were given: Pain Booklet, Coughing and Deep Breathing, MRSA Information and Surgical Site Infection Prevention

## 2012-01-18 ENCOUNTER — Ambulatory Visit (HOSPITAL_COMMUNITY)
Admission: RE | Admit: 2012-01-18 | Discharge: 2012-01-18 | Disposition: A | Discharge status: Home / Self Care | Payer: Medicare Other | Source: Ambulatory Visit | Attending: Vascular Surgery | Admitting: Vascular Surgery

## 2012-01-18 ENCOUNTER — Ambulatory Visit (HOSPITAL_COMMUNITY): Payer: Medicare Other

## 2012-01-18 ENCOUNTER — Ambulatory Visit (HOSPITAL_COMMUNITY): Payer: Medicare Other | Admitting: Certified Registered"

## 2012-01-18 ENCOUNTER — Encounter (HOSPITAL_COMMUNITY): Payer: Self-pay | Admitting: Surgery

## 2012-01-18 ENCOUNTER — Encounter (HOSPITAL_COMMUNITY): Payer: Self-pay | Admitting: Certified Registered"

## 2012-01-18 ENCOUNTER — Telehealth: Payer: Self-pay | Admitting: Vascular Surgery

## 2012-01-18 ENCOUNTER — Encounter (HOSPITAL_COMMUNITY)
Admission: RE | Disposition: A | Discharge status: Home / Self Care | Payer: Self-pay | Source: Ambulatory Visit | Attending: Vascular Surgery

## 2012-01-18 ENCOUNTER — Other Ambulatory Visit: Payer: Self-pay | Admitting: *Deleted

## 2012-01-18 DIAGNOSIS — I129 Hypertensive chronic kidney disease with stage 1 through stage 4 chronic kidney disease, or unspecified chronic kidney disease: Secondary | ICD-10-CM | POA: Diagnosis not present

## 2012-01-18 DIAGNOSIS — R0789 Other chest pain: Secondary | ICD-10-CM | POA: Diagnosis not present

## 2012-01-18 DIAGNOSIS — N186 End stage renal disease: Secondary | ICD-10-CM

## 2012-01-18 DIAGNOSIS — F17200 Nicotine dependence, unspecified, uncomplicated: Secondary | ICD-10-CM | POA: Insufficient documentation

## 2012-01-18 DIAGNOSIS — E669 Obesity, unspecified: Secondary | ICD-10-CM | POA: Diagnosis not present

## 2012-01-18 DIAGNOSIS — Z4931 Encounter for adequacy testing for hemodialysis: Secondary | ICD-10-CM

## 2012-01-18 DIAGNOSIS — N184 Chronic kidney disease, stage 4 (severe): Secondary | ICD-10-CM | POA: Diagnosis not present

## 2012-01-18 DIAGNOSIS — Z01818 Encounter for other preprocedural examination: Secondary | ICD-10-CM | POA: Diagnosis not present

## 2012-01-18 DIAGNOSIS — I10 Essential (primary) hypertension: Secondary | ICD-10-CM | POA: Diagnosis not present

## 2012-01-18 HISTORY — PX: AV FISTULA PLACEMENT: SHX1204

## 2012-01-18 LAB — POCT I-STAT, CHEM 8
BUN: 67 mg/dL — ABNORMAL HIGH (ref 6–23)
Potassium: 4.7 mEq/L (ref 3.5–5.1)
Sodium: 138 mEq/L (ref 135–145)
TCO2: 19 mmol/L (ref 0–100)

## 2012-01-18 LAB — SURGICAL PCR SCREEN: Staphylococcus aureus: NEGATIVE

## 2012-01-18 SURGERY — ARTERIOVENOUS (AV) FISTULA CREATION
Anesthesia: Monitor Anesthesia Care | Site: Arm Lower | Laterality: Left | Wound class: Clean

## 2012-01-18 MED ORDER — PROPOFOL INFUSION 10 MG/ML OPTIME
INTRAVENOUS | Status: DC | PRN
  Administered 2012-01-18: 100 ug/kg/min via INTRAVENOUS

## 2012-01-18 MED ORDER — MIDAZOLAM HCL 5 MG/5ML IJ SOLN
INTRAMUSCULAR | Status: DC | PRN
  Administered 2012-01-18: 2 mg via INTRAVENOUS

## 2012-01-18 MED ORDER — HEPARIN SODIUM (PORCINE) 1000 UNIT/ML IJ SOLN
INTRAMUSCULAR | Status: DC | PRN
  Administered 2012-01-18: 6000 [IU] via INTRAVENOUS

## 2012-01-18 MED ORDER — MUPIROCIN 2 % EX OINT
TOPICAL_OINTMENT | Freq: Two times a day (BID) | CUTANEOUS | Status: DC
  Administered 2012-01-18: 1 via NASAL
  Filled 2012-01-18: qty 22

## 2012-01-18 MED ORDER — LIDOCAINE-EPINEPHRINE (PF) 1 %-1:200000 IJ SOLN
INTRAMUSCULAR | Status: AC
  Filled 2012-01-18: qty 10

## 2012-01-18 MED ORDER — SODIUM CHLORIDE 0.9 % IR SOLN
Status: DC | PRN
  Administered 2012-01-18: 11:00:00

## 2012-01-18 MED ORDER — LIDOCAINE-EPINEPHRINE (PF) 1 %-1:200000 IJ SOLN
INTRAMUSCULAR | Status: DC | PRN
  Administered 2012-01-18: 30 mL

## 2012-01-18 MED ORDER — ONDANSETRON HCL 4 MG/2ML IJ SOLN
INTRAMUSCULAR | Status: DC | PRN
  Administered 2012-01-18: 4 mg via INTRAVENOUS

## 2012-01-18 MED ORDER — FENTANYL CITRATE 0.05 MG/ML IJ SOLN
INTRAMUSCULAR | Status: DC | PRN
  Administered 2012-01-18: 50 ug via INTRAVENOUS
  Administered 2012-01-18 (×2): 25 ug via INTRAVENOUS
  Administered 2012-01-18: 50 ug via INTRAVENOUS

## 2012-01-18 MED ORDER — HYDROMORPHONE HCL PF 1 MG/ML IJ SOLN: 0.2500 mg | INTRAMUSCULAR | Status: DC | PRN

## 2012-01-18 MED ORDER — PHENYLEPHRINE HCL 10 MG/ML IJ SOLN
INTRAMUSCULAR | Status: DC | PRN
  Administered 2012-01-18 (×3): 40 ug via INTRAVENOUS

## 2012-01-18 MED ORDER — PROTAMINE SULFATE 10 MG/ML IV SOLN
INTRAVENOUS | Status: DC | PRN
  Administered 2012-01-18 (×3): 10 mg via INTRAVENOUS

## 2012-01-18 MED ORDER — SODIUM CHLORIDE 0.9 % IV SOLN
INTRAVENOUS | Status: DC | PRN
  Administered 2012-01-18: 10:00:00 via INTRAVENOUS

## 2012-01-18 MED ORDER — 0.9 % SODIUM CHLORIDE (POUR BTL) OPTIME
TOPICAL | Status: DC | PRN
  Administered 2012-01-18: 1000 mL

## 2012-01-18 MED ORDER — OXYCODONE-ACETAMINOPHEN 5-325 MG PO TABS: 1.0000 | ORAL_TABLET | ORAL | Status: DC | PRN

## 2012-01-18 MED ORDER — MUPIROCIN 2 % EX OINT
TOPICAL_OINTMENT | CUTANEOUS | Status: AC
  Administered 2012-01-18: 1 via NASAL
  Filled 2012-01-18: qty 22

## 2012-01-18 SURGICAL SUPPLY — 37 items
ADH SKN CLS APL DERMABOND .7 (GAUZE/BANDAGES/DRESSINGS) ×1
CANISTER SUCTION 2500CC (MISCELLANEOUS) ×2 IMPLANT
CLIP TI MEDIUM 6 (CLIP) ×2 IMPLANT
CLIP TI WIDE RED SMALL 6 (CLIP) ×2 IMPLANT
CLOTH BEACON ORANGE TIMEOUT ST (SAFETY) ×2 IMPLANT
COVER PROBE W GEL 5X96 (DRAPES) ×2 IMPLANT
COVER SURGICAL LIGHT HANDLE (MISCELLANEOUS) ×2 IMPLANT
COVER TRANSDUCER ULTRASND GEL (DRAPE) ×1 IMPLANT
DECANTER SPIKE VIAL GLASS SM (MISCELLANEOUS) ×2 IMPLANT
DERMABOND ADVANCED (GAUZE/BANDAGES/DRESSINGS) ×1
DERMABOND ADVANCED .7 DNX12 (GAUZE/BANDAGES/DRESSINGS) ×1 IMPLANT
DRAIN PENROSE 1/2X12 LTX STRL (WOUND CARE) IMPLANT
ELECT REM PT RETURN 9FT ADLT (ELECTROSURGICAL) ×2
ELECTRODE REM PT RTRN 9FT ADLT (ELECTROSURGICAL) ×1 IMPLANT
GLOVE BIO SURGEON STRL SZ7.5 (GLOVE) ×2 IMPLANT
GLOVE BIOGEL PI IND STRL 7.0 (GLOVE) IMPLANT
GLOVE BIOGEL PI IND STRL 8 (GLOVE) ×1 IMPLANT
GLOVE BIOGEL PI INDICATOR 7.0 (GLOVE) ×2
GLOVE BIOGEL PI INDICATOR 8 (GLOVE) ×1
GLOVE SURG SS PI 7.0 STRL IVOR (GLOVE) ×1 IMPLANT
GOWN BRE IMP SLV SIRUS LXLNG (GOWN DISPOSABLE) ×1 IMPLANT
GOWN STRL NON-REIN LRG LVL3 (GOWN DISPOSABLE) ×4 IMPLANT
KIT BASIN OR (CUSTOM PROCEDURE TRAY) ×2 IMPLANT
KIT ROOM TURNOVER OR (KITS) ×2 IMPLANT
NS IRRIG 1000ML POUR BTL (IV SOLUTION) ×2 IMPLANT
PACK CV ACCESS (CUSTOM PROCEDURE TRAY) ×2 IMPLANT
PAD ARMBOARD 7.5X6 YLW CONV (MISCELLANEOUS) ×4 IMPLANT
SPONGE GAUZE 4X4 12PLY (GAUZE/BANDAGES/DRESSINGS) IMPLANT
SPONGE SURGIFOAM ABS GEL 100 (HEMOSTASIS) IMPLANT
SUT PROLENE 6 0 BV (SUTURE) ×2 IMPLANT
SUT VIC AB 3-0 SH 27 (SUTURE) ×4
SUT VIC AB 3-0 SH 27X BRD (SUTURE) ×1 IMPLANT
SUT VICRYL 4-0 PS2 18IN ABS (SUTURE) ×3 IMPLANT
TOWEL OR 17X24 6PK STRL BLUE (TOWEL DISPOSABLE) ×2 IMPLANT
TOWEL OR 17X26 10 PK STRL BLUE (TOWEL DISPOSABLE) ×2 IMPLANT
UNDERPAD 30X30 INCONTINENT (UNDERPADS AND DIAPERS) ×2 IMPLANT
WATER STERILE IRR 1000ML POUR (IV SOLUTION) ×2 IMPLANT

## 2012-01-18 NOTE — H&P (View-Only) (Signed)
Vascular and Vein Specialist of [facility]  Patient name: Jessica Clarke MRN: [id number] DOB: 1950-07-06 Sex: female  REASON FOR CONSULT: evaluate for AV fistula. Referred by Dr. Marval Regal  HPI: Jessica Clarke is a 61 y.o. female with stage IV chronic kidney disease. She is not yet on dialysis. She was referred for evaluation for a fistula only. She denies any recent uremic symptoms. Specifically she denies shortness of breath, fatigue, anorexia, or palpitations.  I have reviewed her records from [facility]. She does have hypertension which has improved recently. She also has anemia of chronic disease and secondary hyperparathyroidism.  Past Medical History  Diagnosis Date  . Obesity   . Uterine polyp   . Cervical dysplasia   . Hypertension   . Chronic kidney disease   . Constipation   . Gout   . Constipation   . Abnormal Pap smear     ASCUS ?HGSIL    Family History  Problem Relation Age of Onset  . Colon cancer Neg Hx   . Esophageal cancer    . Pancreatic cancer    . Heart disease Mother   . Hypertension Mother   . Breast cancer Sister   . Esophageal cancer Sister   . Cancer Sister   . Deep vein thrombosis Sister   . Diabetes Sister   . Hypertension Sister   . Hypertension Daughter     SOCIAL HISTORY: History  Substance Use Topics  . Smoking status: Current Every Day Smoker -- 0.5 packs/day for 30 years    Types: Cigarettes  . Smokeless tobacco: Never Used  . Alcohol Use: 1.2 oz/week    2 Cans of beer per week    No Known Allergies  Current Outpatient Prescriptions  Medication Sig Dispense Refill  . allopurinol (ZYLOPRIM) 100 MG tablet Take 100 mg by mouth daily.        Marland Kitchen atenolol (TENORMIN) 100 MG tablet Take 100 mg by mouth daily.        . cloNIDine (CATAPRES) 0.2 MG tablet Take 0.2 mg by mouth 2 (two) times daily.        . furosemide (LASIX) 40 MG tablet Take 40 mg by mouth 2 (two) times daily.        Marland Kitchen loratadine (CLARITIN) 10 MG tablet  Take 10 mg by mouth daily. As needed       . HYDROcodone-acetaminophen (VICODIN) 5-500 MG per tablet Take 1 tablet by mouth. As needed      . lubiprostone (AMITIZA) 24 MCG capsule Take 24 mcg by mouth 2 (two) times daily with a meal.        Current Facility-Administered Medications  Medication Dose Route Frequency Provider Last Rate Last Dose  . 0.9 %  sodium chloride infusion  500 mL Intravenous Continuous Inda Castle, MD        REVIEW OF SYSTEMS: Valu.Nieves ] denotes positive finding; [  ] denotes negative finding  CARDIOVASCULAR:  [ ]  chest pain   [ ]  chest pressure   [ ]  palpitations   [ ]  orthopnea   [ ]  dyspnea on exertion   [ ]  claudication   [ ]  rest pain   [ ]  DVT   [ ]  phlebitis PULMONARY:   [ ]  productive cough   [ ]  asthma   [ ]  wheezing NEUROLOGIC:   [ ]  weakness  [ ]  paresthesias  [ ]  aphasia  [ ]  amaurosis  [ ]  dizziness HEMATOLOGIC:   [ ]  bleeding problems   [ ]   clotting disorders MUSCULOSKELETAL:  [ ]  joint pain   [ ]  joint swelling [ ]  leg swelling GASTROINTESTINAL: [ ]   blood in stool  [ ]   hematemesis GENITOURINARY:  [ ]   dysuria  [ ]   hematuria PSYCHIATRIC:  [ ]  history of major depression INTEGUMENTARY:  [ ]  rashes  [ ]  ulcers CONSTITUTIONAL:  [ ]  fever   [ ]  chills  PHYSICAL EXAM: Filed Vitals:   12/27/11 1028  BP: 132/88  Pulse: 64  Height: 5\' 11"  (1.803 m)  Weight: 190 lb (86.183 kg)  SpO2: 100%   Body mass index is 26.50 kg/(m^2). GENERAL: The patient is a well-nourished female, in no acute distress. The vital signs are documented above. CARDIOVASCULAR: There is a regular rate and rhythm. I cannot palpate a right radial pulse. She does have a palpable left brachial and left radial pulse. PULMONARY: There is good air exchange bilaterally without wheezing or rales. ABDOMEN: Soft and non-tender with normal pitched bowel sounds.  MUSCULOSKELETAL: There are no major deformities or cyanosis. NEUROLOGIC: No focal weakness or paresthesias are detected. SKIN: There  are no ulcers or rashes noted. PSYCHIATRIC: The patient has a normal affect.  DATA:  I have independently interpreted her vein mapping today. This shows that her forearm cephalic vein is very small. The upper arm cephalic vein appears marginal in size but appears to be her best option for a fistula. The basilic vein likewise is fairly small.  MEDICAL ISSUES: I've recommended that we place a left brachiocephalic AV fistula.I have explained the indications for placement of an AV fistula. I have reviewed the risks of placement of an AV fistula including but not limited to: failure of the fistula to mature, need for subsequent interventions, and thrombosis.  All the patient's questions were answered and they are agreeable to proceed with surgery. She would like to schedule her surgery on December 5 as she needs to work around her daughter's school schedule.  [place] [facility] of [place] Beeper: [phone]

## 2012-01-18 NOTE — Telephone Encounter (Signed)
Spoke with patient to schedule follow up

## 2012-01-18 NOTE — Anesthesia Procedure Notes (Signed)
Procedure Name: MAC Date/Time: 01/18/2012 10:41 AM Performed by: Maeola Harman Pre-anesthesia Checklist: Patient identified, Emergency Drugs available, Suction available, Patient being monitored and Timeout performed Patient Re-evaluated:Patient Re-evaluated prior to inductionOxygen Delivery Method: Simple face mask Preoxygenation: Pre-oxygenation with 100% oxygen Placement Confirmation: positive ETCO2 Dental Injury: Teeth and Oropharynx as per pre-operative assessment

## 2012-01-18 NOTE — Telephone Encounter (Signed)
Message copied by Gena Fray on Thu Jan 18, 2012  3:15 PM ------      Message from: Alfonso Patten      Created: Thu Jan 18, 2012 12:31 PM      Regarding: FW: charge and f/u                   ----- Message -----         From: Angelia Mould, MD         Sent: 01/18/2012  11:36 AM           To: Patrici Ranks, Alfonso Patten, RN      Subject: charge and f/u                                           PROCEDURE: left brachial cephalic AV fistula            SURGEON: Judeth Cornfield. Scot Dock, MD, FACS            ASSIST: Wray Kearns PA            She needs a follow up visit in 6 weeks with a ultrasound of her fistula at that time. Thank you CSD

## 2012-01-18 NOTE — Anesthesia Postprocedure Evaluation (Signed)
  Anesthesia Post-op Note  Patient: Jessica Clarke  Procedure(s) Performed: Procedure(s) (LRB) with comments: ARTERIOVENOUS (AV) FISTULA CREATION (Left)  Patient Location: PACU  Anesthesia Type:MAC  Level of Consciousness: awake  Airway and Oxygen Therapy: Patient Spontanous Breathing  Post-op Pain: mild  Post-op Assessment: Post-op Vital signs reviewed  Post-op Vital Signs: Reviewed  Complications: No apparent anesthesia complications

## 2012-01-18 NOTE — Interval H&P Note (Signed)
History and Physical Interval Note:  01/18/2012 9:31 AM  Jessica Clarke  has presented today for surgery, with the diagnosis of ESRD  The various methods of treatment have been discussed with the patient and family. After consideration of risks, benefits and other options for treatment, the patient has consented to  Procedure(s) (LRB) with comments: ARTERIOVENOUS (AV) FISTULA CREATION (Left) as a surgical intervention .  The patient's history has been reviewed, patient examined, no change in status, stable for surgery.  I have reviewed the patient's chart and labs.  Questions were answered to the patient's satisfaction.     DICKSON,CHRISTOPHER S

## 2012-01-18 NOTE — Op Note (Signed)
NAME: Jessica Clarke    MRN: [id number] DOB: 1950-06-08    DATE OF OPERATION: 01/18/2012  PREOP DIAGNOSIS: chronic kidney disease  POSTOP DIAGNOSIS: same  PROCEDURE: left brachial cephalic AV fistula  SURGEON: Judeth Cornfield. Scot Dock, MD, FACS  ASSIST: Wray Kearns PA  ANESTHESIA: local with sedation   EBL: minimal  INDICATIONS: [name] is a 61 y.o. female who is not yet on dialysis. We were asked to provide new access.  FINDINGS: 4 mm vein.  TECHNIQUE: The patient was brought to the operating room and sedated by anesthesia. The left upper extremity was prepped and draped in the usual sterile fashion. After the skin was anesthetized with 1% lidocaine, a transverse incision was made just above the antecubital level. The cephalic vein was dissected free and ligated distally. It irrigated nicely with heparinized saline. It was an approximately 4 mm vein. The brachial artery was dissected free beneath the fascia. It was somewhat deep. The artery did spasm some but was patent. The patient was heparinized. The brachial artery was clamped proximally and distally and a longitudinal arteriotomy was made. The vein was sewn end to side to the artery using continuous 6-0 Prolene suture. At the completion was an excellent thrill in the fistula and a palpable radial pulse. Hemostasis was obtained in the wound. The heparin was partially reversed with protamine. The wounds closed the deep layer of 3-0 Vicryl and the skin closed with 4-0 Vicryl. Dermabond was applied. The patient tolerated the procedure well and was transferred to the recovery room in stable condition. All needle and sponge counts were correct.  Deitra Mayo, MD, FACS [facility]ascular and Vein Specialists of [facility]  DATE OF DICTATION:   01/18/2012

## 2012-01-18 NOTE — Transfer of Care (Signed)
Immediate Anesthesia Transfer of Care Note  Patient: Jessica Clarke  Procedure(s) Performed: Procedure(s) (LRB) with comments: ARTERIOVENOUS (AV) FISTULA CREATION (Left)  Patient Location: PACU  Anesthesia Type:MAC  Level of Consciousness: awake, alert  and oriented  Airway & Oxygen Therapy: Patient Spontanous Breathing  Post-op Assessment: Report given to PACU RN  Post vital signs: stable  Complications: No apparent anesthesia complications

## 2012-01-18 NOTE — Anesthesia Preprocedure Evaluation (Addendum)
Anesthesia Evaluation  Patient identified by MRN, date of birth, ID band Patient awake    Reviewed: Allergy & Precautions, H&P , NPO status , Patient's Chart, lab work & pertinent test results, reviewed documented beta blocker date and time   Airway Mallampati: I TM Distance: >3 FB     Dental   Pulmonary Current Smoker,  breath sounds clear to auscultation        Cardiovascular hypertension, Pt. on medications Rhythm:Regular Rate:Normal     Neuro/Psych    GI/Hepatic negative GI ROS, Neg liver ROS,   Endo/Other  negative endocrine ROS  Renal/GU ESRFRenal disease     Musculoskeletal negative musculoskeletal ROS (+)   Abdominal   Peds  Hematology negative hematology ROS (+)   Anesthesia Other Findings   Reproductive/Obstetrics negative OB ROS                         Anesthesia Physical Anesthesia Plan  ASA: III  Anesthesia Plan: MAC   Post-op Pain Management:    Induction: Intravenous  Airway Management Planned: Simple Face Mask  Additional Equipment:   Intra-op Plan:   Post-operative Plan:   Informed Consent:   Dental advisory given  Plan Discussed with: CRNA, Anesthesiologist and Surgeon  Anesthesia Plan Comments:         Anesthesia Quick Evaluation

## 2012-01-18 NOTE — Preoperative (Signed)
Beta Blockers   Reason not to administer Beta Blockers:Not Applicable 

## 2012-01-19 ENCOUNTER — Encounter (HOSPITAL_COMMUNITY): Payer: Self-pay | Admitting: Vascular Surgery

## 2012-02-27 ENCOUNTER — Encounter: Payer: Self-pay | Admitting: Vascular Surgery

## 2012-02-28 ENCOUNTER — Encounter (INDEPENDENT_AMBULATORY_CARE_PROVIDER_SITE_OTHER): Payer: Medicare Other | Admitting: *Deleted

## 2012-02-28 ENCOUNTER — Ambulatory Visit (INDEPENDENT_AMBULATORY_CARE_PROVIDER_SITE_OTHER): Payer: Medicare Other | Admitting: Vascular Surgery

## 2012-02-28 ENCOUNTER — Encounter: Payer: Self-pay | Admitting: Vascular Surgery

## 2012-02-28 VITALS — BP 118/76 | HR 74 | Ht 71.0 in | Wt 189.9 lb

## 2012-02-28 DIAGNOSIS — N186 End stage renal disease: Secondary | ICD-10-CM

## 2012-02-28 DIAGNOSIS — Z4931 Encounter for adequacy testing for hemodialysis: Secondary | ICD-10-CM

## 2012-02-28 DIAGNOSIS — T82598A Other mechanical complication of other cardiac and vascular devices and implants, initial encounter: Secondary | ICD-10-CM

## 2012-02-28 NOTE — Progress Notes (Signed)
Vascular and Vein Specialist of [facility]  Patient name: Jessica Clarke MRN: [id number] DOB: [birth year] Sex: female  REASON FOR VISIT: follow up of left upper arm AV fistula.  HPI: Jessica Clarke is a 62 y.o. female who is not yet on dialysis. She a left brachiocephalic AV fistula placed on 01/18/2012. It was noted that she had a 4 mm upper arm cephalic vein. She comes in for a 4 week follow up visit. She's had no pain or paresthesias in the left arm. She has no specific complaints.   REVIEW OF SYSTEMS: Valu.Nieves ] denotes positive finding; [  ] denotes negative finding  CARDIOVASCULAR:  [ ]  chest pain   [ ]  dyspnea on exertion    CONSTITUTIONAL:  [ ]  fever   [ ]  chills  PHYSICAL EXAM: Filed Vitals:   02/28/12 1510  BP: 118/76  Pulse: 74  Height: 5\' 11"  (1.803 m)  Weight: 189 lb 14.4 oz (86.138 kg)  SpO2: 100%   Body mass index is 26.49 kg/(m^2). GENERAL: The patient is a well-nourished female, in no acute distress. The vital signs are documented above. CARDIOVASCULAR: There is a regular rate and rhythm  PULMONARY: There is good air exchange bilaterally without wheezing or rales. The fistula has an excellent thrill and appears to be maturing adequately. Incision has healed nicely.  I have independently interpreted her duplex of her fistula which shows that she does have some elevated velocities in the proximal fistula. Otherwise no specific problems are identified.  MEDICAL ISSUES: Based on palpation of her fistula do not think the elevated velocities in the proximal fistula are the result of a significant stenosis. I've asked to see her back in 4 weeks and we will duplex her fistula again and obtained depths and diameters of the fistula. However, this point I think the fistula appears to be maturing adequately.  [place] [facility] of [place] Beeper: [phone]

## 2012-02-29 NOTE — Addendum Note (Signed)
Addended by: Mena Goes on: 02/29/2012 08:41 AM   Modules accepted: Orders

## 2012-03-11 ENCOUNTER — Ambulatory Visit
Admission: RE | Admit: 2012-03-11 | Discharge: 2012-03-11 | Disposition: A | Discharge status: Home / Self Care | Payer: Medicare Other | Source: Ambulatory Visit | Attending: Nephrology | Admitting: Nephrology

## 2012-03-11 ENCOUNTER — Other Ambulatory Visit: Payer: Self-pay | Admitting: Nephrology

## 2012-03-11 DIAGNOSIS — I10 Essential (primary) hypertension: Secondary | ICD-10-CM | POA: Diagnosis not present

## 2012-03-11 DIAGNOSIS — N184 Chronic kidney disease, stage 4 (severe): Secondary | ICD-10-CM | POA: Diagnosis not present

## 2012-03-11 DIAGNOSIS — R0989 Other specified symptoms and signs involving the circulatory and respiratory systems: Secondary | ICD-10-CM

## 2012-03-11 DIAGNOSIS — R05 Cough: Secondary | ICD-10-CM

## 2012-03-11 DIAGNOSIS — R0602 Shortness of breath: Secondary | ICD-10-CM

## 2012-03-11 DIAGNOSIS — N2581 Secondary hyperparathyroidism of renal origin: Secondary | ICD-10-CM | POA: Diagnosis not present

## 2012-03-11 DIAGNOSIS — J40 Bronchitis, not specified as acute or chronic: Secondary | ICD-10-CM | POA: Diagnosis not present

## 2012-03-11 DIAGNOSIS — D509 Iron deficiency anemia, unspecified: Secondary | ICD-10-CM | POA: Diagnosis not present

## 2012-03-26 ENCOUNTER — Encounter: Payer: Self-pay | Admitting: Vascular Surgery

## 2012-03-27 ENCOUNTER — Ambulatory Visit: Payer: Medicare Other | Admitting: Vascular Surgery

## 2012-04-16 ENCOUNTER — Encounter: Payer: Self-pay | Admitting: Vascular Surgery

## 2012-04-17 ENCOUNTER — Encounter: Payer: Self-pay | Admitting: Vascular Surgery

## 2012-04-17 ENCOUNTER — Ambulatory Visit (INDEPENDENT_AMBULATORY_CARE_PROVIDER_SITE_OTHER): Payer: Medicare Other | Admitting: Vascular Surgery

## 2012-04-17 ENCOUNTER — Encounter (INDEPENDENT_AMBULATORY_CARE_PROVIDER_SITE_OTHER): Payer: Medicare Other | Admitting: *Deleted

## 2012-04-17 VITALS — BP 123/81 | HR 67 | Ht 71.0 in | Wt 190.0 lb

## 2012-04-17 DIAGNOSIS — N186 End stage renal disease: Secondary | ICD-10-CM

## 2012-04-17 NOTE — Progress Notes (Signed)
Vascular and Vein Specialist of [place]  Patient name: Jessica Clarke MRN: [id number] DOB: 08-21-1950 Sex: female  REASON FOR VISIT: follow up of left brachiocephalic AV fistula  HPI: Jessica Clarke is a 63 y.o. female who is not yet on dialysis. She had a left brachiocephalic AV fistula placed on 01/18/2012. She comes in for a routine follow up visit. She has not had any pain or paresthesias in the left upper extremity. He denies any recent uremic symptoms.   REVIEW OF SYSTEMS: Valu.Nieves ] denotes positive finding; [  ] denotes negative finding  CARDIOVASCULAR:  [ ]  chest pain   [ ]  dyspnea on exertion    CONSTITUTIONAL:  [ ]  fever   [ ]  chills  PHYSICAL EXAM: Filed Vitals:   04/17/12 1507  BP: 123/81  Pulse: 67  Height: 5\' 11"  (1.803 m)  Weight: 190 lb (86.183 kg)  SpO2: 100%   Body mass index is 26.51 kg/(m^2). GENERAL: The patient is a well-nourished female, in no acute distress. The vital signs are documented above. CARDIOVASCULAR: There is a regular rate and rhythm  PULMONARY: There is good air exchange bilaterally without wheezing or rales. She has a palpable left radial pulse. The fistula has an excellent thrill. It appears to be maturing adequately.  MEDICAL ISSUES:  End stage renal disease Her left brachiocephalic AV fistula appears to be there are some elevated velocities at the proximal anastomosis although this appears to simply be a valve cusp. Diameters appear to be adequate ranging from 0.56-0.70 cm in diameter. The vein is slightly deep in 2 areas at 1.4 cm. He is not yet on dialysis. I would like to see her back in 2 months for a follow up duplex scan of her fistula. If this continues to mature nicely then no further workup will be needed. If however there are any issues identified she could potentially require a fistulogram although currently am reluctant to give her contrast as she is not yet on dialysis and the fistula does appear to be maturing  adequately.   [place] [facility] of [place] Beeper: [phone]

## 2012-04-17 NOTE — Assessment & Plan Note (Signed)
Her left brachiocephalic AV fistula appears to be there are some elevated velocities at the proximal anastomosis although this appears to simply be a valve cusp. Diameters appear to be adequate ranging from 0.56-0.70 cm in diameter. The vein is slightly deep in 2 areas at 1.4 cm. He is not yet on dialysis. I would like to see her back in 2 months for a follow up duplex scan of her fistula. If this continues to mature nicely then no further workup will be needed. If however there are any issues identified she could potentially require a fistulogram although currently am reluctant to give her contrast as she is not yet on dialysis and the fistula does appear to be maturing adequately.

## 2012-04-22 NOTE — Addendum Note (Signed)
Addended by: Mena Goes on: 04/22/2012 09:15 AM   Modules accepted: Orders

## 2012-06-07 DIAGNOSIS — N184 Chronic kidney disease, stage 4 (severe): Secondary | ICD-10-CM | POA: Diagnosis not present

## 2012-06-07 DIAGNOSIS — D509 Iron deficiency anemia, unspecified: Secondary | ICD-10-CM | POA: Diagnosis not present

## 2012-06-07 DIAGNOSIS — N390 Urinary tract infection, site not specified: Secondary | ICD-10-CM | POA: Diagnosis not present

## 2012-06-07 DIAGNOSIS — N2581 Secondary hyperparathyroidism of renal origin: Secondary | ICD-10-CM | POA: Diagnosis not present

## 2012-06-07 DIAGNOSIS — I10 Essential (primary) hypertension: Secondary | ICD-10-CM | POA: Diagnosis not present

## 2012-06-19 ENCOUNTER — Other Ambulatory Visit (HOSPITAL_COMMUNITY): Payer: Self-pay | Admitting: *Deleted

## 2012-06-20 ENCOUNTER — Encounter (HOSPITAL_COMMUNITY): Payer: Medicare Other

## 2012-06-20 DIAGNOSIS — I129 Hypertensive chronic kidney disease with stage 1 through stage 4 chronic kidney disease, or unspecified chronic kidney disease: Secondary | ICD-10-CM | POA: Diagnosis not present

## 2012-06-20 DIAGNOSIS — I10 Essential (primary) hypertension: Secondary | ICD-10-CM | POA: Diagnosis not present

## 2012-06-20 DIAGNOSIS — R809 Proteinuria, unspecified: Secondary | ICD-10-CM | POA: Diagnosis not present

## 2012-06-20 DIAGNOSIS — N184 Chronic kidney disease, stage 4 (severe): Secondary | ICD-10-CM | POA: Diagnosis not present

## 2012-06-24 ENCOUNTER — Encounter (HOSPITAL_COMMUNITY)
Admission: RE | Admit: 2012-06-24 | Discharge: 2012-06-24 | Disposition: A | Discharge status: Home / Self Care | Payer: Medicare Other | Source: Ambulatory Visit | Attending: Nephrology | Admitting: Nephrology

## 2012-06-24 DIAGNOSIS — D638 Anemia in other chronic diseases classified elsewhere: Secondary | ICD-10-CM | POA: Insufficient documentation

## 2012-06-24 DIAGNOSIS — N189 Chronic kidney disease, unspecified: Secondary | ICD-10-CM | POA: Insufficient documentation

## 2012-06-24 LAB — POCT HEMOGLOBIN-HEMACUE: Hemoglobin: 8.8 g/dL — ABNORMAL LOW (ref 12.0–15.0)

## 2012-06-24 MED ORDER — DARBEPOETIN ALFA-POLYSORBATE 60 MCG/0.3ML IJ SOLN
INTRAMUSCULAR | Status: AC
  Administered 2012-06-24: 60 ug via SUBCUTANEOUS
  Filled 2012-06-24: qty 0.3

## 2012-06-24 MED ORDER — DARBEPOETIN ALFA-POLYSORBATE 60 MCG/0.3ML IJ SOLN: 60.0000 ug | INTRAMUSCULAR | Status: DC

## 2012-06-25 ENCOUNTER — Encounter: Payer: Self-pay | Admitting: Vascular Surgery

## 2012-06-26 ENCOUNTER — Encounter (INDEPENDENT_AMBULATORY_CARE_PROVIDER_SITE_OTHER): Payer: Medicare Other | Admitting: *Deleted

## 2012-06-26 ENCOUNTER — Ambulatory Visit (INDEPENDENT_AMBULATORY_CARE_PROVIDER_SITE_OTHER): Payer: Medicaid Other | Admitting: Vascular Surgery

## 2012-06-26 ENCOUNTER — Encounter: Payer: Self-pay | Admitting: Vascular Surgery

## 2012-06-26 VITALS — BP 115/68 | HR 77 | Ht 71.0 in | Wt 182.0 lb

## 2012-06-26 DIAGNOSIS — Z4931 Encounter for adequacy testing for hemodialysis: Secondary | ICD-10-CM

## 2012-06-26 DIAGNOSIS — N186 End stage renal disease: Secondary | ICD-10-CM

## 2012-06-26 NOTE — Progress Notes (Addendum)
[facility] [place]  Established Dialysis Access  History of Present Illness  Anniece Deperalta is a 62 y.o. (14-Apr-1950) female who presents for re-evaluation for permanent access.  The patient is right hand dominant.  She had her fistula creation on the left upper arm  on 01/18/2012.  She is here for a follow up visit there are elevated velocities antecubital fossa vein  751 cm/s.  She is not currently on dialysis and she has no sign of steal.  The patient has not had a previous PPM placed.  The only medical change she has had recently was a diagnosis of anemia that is being treated.    Past Medical History, Past Surgical History, Social History, Family History, Medications, Allergies, and Review of Systems are unchanged from previous visit on 04/17/2012.  Physical Examination  Filed Vitals:   06/26/12 1354  BP: 115/68  Pulse: 77  Height: 5\' 11"  (1.803 m)  Weight: 182 lb (82.555 kg)  SpO2: 100%   Body mass index is 25.4 kg/(m^2).  General: A&O x 3, WD,   Pulmonary: Sym exp, good air movt, CTAB, no rales, rhonchi, & wheezing,  Cardiac: RRR,  Gastrointestinal: soft, NTND,   Musculoskeletal: M/S 5/5 throughout   Vascular:She has a palpable left radial pulse. The fistula has an excellent thrill. It appears to be maturing adequately   Neurologic: Pain and light touch intact in extremities  Non-Invasive Vascular Imaging  Fistula duplex Diameter 0.51-0.71 cm Depth 0.37-0.81 velocities 751 at antecubital fossa 751    Medical Decision Making  Page Danielski is a 62 y.o. female is here for a follow up on the maturation of her fistula.  The left diameters are increasing nicely and there is a good palpable thrill.  We would like to see her back in 2 months for a follow up duplex scan of her fistula. If this continues to mature nicely then no further workup will be needed. If however there are any issues identified she could potentially require a fistulogram  although currently am reluctant to give her contrast as she is not yet on dialysis and the fistula does appear to be maturing adequately.   Theda Sers Memori Sammon [facility] PA-C [facility] [place] Office: [phone]   06/26/2012, 2:13 PM  Agree with above. Her fistula appears to have a reasonable thrill. Diameters of the fistula and depths appear reasonable. She does has an elevated velocities proximally however this does not appear to be limiting flow. I'll see her back in 3 months with a follow duplex scan. She knows to call sooner if she has problems.  Deitra Mayo, MD, [place] [phone] 06/26/2012

## 2012-07-09 ENCOUNTER — Encounter (HOSPITAL_COMMUNITY)
Admission: RE | Admit: 2012-07-09 | Discharge: 2012-07-09 | Disposition: A | Discharge status: Home / Self Care | Payer: Medicare Other | Source: Ambulatory Visit | Attending: Nephrology | Admitting: Nephrology

## 2012-07-09 DIAGNOSIS — D509 Iron deficiency anemia, unspecified: Secondary | ICD-10-CM | POA: Diagnosis not present

## 2012-07-09 DIAGNOSIS — I10 Essential (primary) hypertension: Secondary | ICD-10-CM | POA: Diagnosis not present

## 2012-07-09 DIAGNOSIS — N184 Chronic kidney disease, stage 4 (severe): Secondary | ICD-10-CM | POA: Diagnosis not present

## 2012-07-09 DIAGNOSIS — N2581 Secondary hyperparathyroidism of renal origin: Secondary | ICD-10-CM | POA: Diagnosis not present

## 2012-07-09 LAB — POCT HEMOGLOBIN-HEMACUE: Hemoglobin: 9.9 g/dL — ABNORMAL LOW (ref 12.0–15.0)

## 2012-07-09 MED ORDER — DARBEPOETIN ALFA-POLYSORBATE 60 MCG/0.3ML IJ SOLN
60.0000 ug | INTRAMUSCULAR | Status: DC
  Administered 2012-07-09: 60 ug via SUBCUTANEOUS

## 2012-07-09 MED ORDER — DARBEPOETIN ALFA-POLYSORBATE 60 MCG/0.3ML IJ SOLN
INTRAMUSCULAR | Status: AC
  Filled 2012-07-09: qty 0.3

## 2012-07-23 ENCOUNTER — Encounter (HOSPITAL_COMMUNITY)
Admission: RE | Admit: 2012-07-23 | Discharge: 2012-07-23 | Disposition: A | Discharge status: Home / Self Care | Payer: Medicare Other | Source: Ambulatory Visit | Attending: Nephrology | Admitting: Nephrology

## 2012-07-23 DIAGNOSIS — N189 Chronic kidney disease, unspecified: Secondary | ICD-10-CM | POA: Insufficient documentation

## 2012-07-23 DIAGNOSIS — D638 Anemia in other chronic diseases classified elsewhere: Secondary | ICD-10-CM | POA: Insufficient documentation

## 2012-07-23 LAB — POCT HEMOGLOBIN-HEMACUE: Hemoglobin: 10.3 g/dL — ABNORMAL LOW (ref 12.0–15.0)

## 2012-07-23 MED ORDER — DARBEPOETIN ALFA-POLYSORBATE 60 MCG/0.3ML IJ SOLN
INTRAMUSCULAR | Status: AC
  Filled 2012-07-23: qty 0.3

## 2012-07-23 MED ORDER — DARBEPOETIN ALFA-POLYSORBATE 60 MCG/0.3ML IJ SOLN
60.0000 ug | INTRAMUSCULAR | Status: DC
  Administered 2012-07-23: 60 ug via SUBCUTANEOUS

## 2012-07-24 LAB — IRON AND TIBC: TIBC: 204 ug/dL — ABNORMAL LOW (ref 250–470)

## 2012-08-06 ENCOUNTER — Encounter (HOSPITAL_COMMUNITY)
Admission: RE | Admit: 2012-08-06 | Discharge: 2012-08-06 | Disposition: A | Discharge status: Home / Self Care | Payer: Medicare Other | Source: Ambulatory Visit | Attending: Nephrology | Admitting: Nephrology

## 2012-08-06 LAB — POCT HEMOGLOBIN-HEMACUE: Hemoglobin: 11.2 g/dL — ABNORMAL LOW (ref 12.0–15.0)

## 2012-08-06 MED ORDER — DARBEPOETIN ALFA-POLYSORBATE 60 MCG/0.3ML IJ SOLN
INTRAMUSCULAR | Status: AC
  Filled 2012-08-06: qty 0.3

## 2012-08-06 MED ORDER — DARBEPOETIN ALFA-POLYSORBATE 60 MCG/0.3ML IJ SOLN
60.0000 ug | INTRAMUSCULAR | Status: DC
  Administered 2012-08-06: 60 ug via SUBCUTANEOUS

## 2012-08-20 ENCOUNTER — Encounter (HOSPITAL_COMMUNITY)
Admission: RE | Admit: 2012-08-20 | Discharge: 2012-08-20 | Disposition: A | Discharge status: Home / Self Care | Payer: Medicare Other | Source: Ambulatory Visit | Attending: Nephrology | Admitting: Nephrology

## 2012-08-20 DIAGNOSIS — N189 Chronic kidney disease, unspecified: Secondary | ICD-10-CM | POA: Insufficient documentation

## 2012-08-20 DIAGNOSIS — D638 Anemia in other chronic diseases classified elsewhere: Secondary | ICD-10-CM | POA: Insufficient documentation

## 2012-08-20 LAB — IRON AND TIBC
Iron: 45 ug/dL (ref 42–135)
Saturation Ratios: 25 % (ref 20–55)
UIBC: 137 ug/dL (ref 125–400)

## 2012-08-20 MED ORDER — DARBEPOETIN ALFA-POLYSORBATE 60 MCG/0.3ML IJ SOLN
INTRAMUSCULAR | Status: AC
  Filled 2012-08-20: qty 0.3

## 2012-08-20 MED ORDER — DARBEPOETIN ALFA-POLYSORBATE 60 MCG/0.3ML IJ SOLN
60.0000 ug | INTRAMUSCULAR | Status: DC
  Administered 2012-08-20: 60 ug via SUBCUTANEOUS

## 2012-09-03 ENCOUNTER — Encounter (HOSPITAL_COMMUNITY): Payer: Medicare Other

## 2012-09-10 ENCOUNTER — Encounter (HOSPITAL_COMMUNITY)
Admission: RE | Admit: 2012-09-10 | Discharge: 2012-09-10 | Disposition: A | Discharge status: Home / Self Care | Payer: Medicare Other | Source: Ambulatory Visit | Attending: Nephrology | Admitting: Nephrology

## 2012-09-10 DIAGNOSIS — D509 Iron deficiency anemia, unspecified: Secondary | ICD-10-CM | POA: Diagnosis not present

## 2012-09-10 DIAGNOSIS — N184 Chronic kidney disease, stage 4 (severe): Secondary | ICD-10-CM | POA: Diagnosis not present

## 2012-09-10 DIAGNOSIS — I10 Essential (primary) hypertension: Secondary | ICD-10-CM | POA: Diagnosis not present

## 2012-09-10 DIAGNOSIS — N2581 Secondary hyperparathyroidism of renal origin: Secondary | ICD-10-CM | POA: Diagnosis not present

## 2012-09-10 MED ORDER — DARBEPOETIN ALFA-POLYSORBATE 60 MCG/0.3ML IJ SOLN
INTRAMUSCULAR | Status: AC
  Administered 2012-09-10: 60 ug via SUBCUTANEOUS
  Filled 2012-09-10: qty 0.3

## 2012-09-10 MED ORDER — DARBEPOETIN ALFA-POLYSORBATE 60 MCG/0.3ML IJ SOLN: 60.0000 ug | INTRAMUSCULAR | Status: DC

## 2012-09-17 ENCOUNTER — Encounter: Payer: Self-pay | Admitting: Vascular Surgery

## 2012-09-18 ENCOUNTER — Encounter (INDEPENDENT_AMBULATORY_CARE_PROVIDER_SITE_OTHER): Payer: Medicare Other | Admitting: *Deleted

## 2012-09-18 ENCOUNTER — Ambulatory Visit (INDEPENDENT_AMBULATORY_CARE_PROVIDER_SITE_OTHER): Payer: Medicare Other | Admitting: Vascular Surgery

## 2012-09-18 ENCOUNTER — Encounter: Payer: Self-pay | Admitting: Vascular Surgery

## 2012-09-18 VITALS — BP 113/75 | HR 74 | Ht 71.0 in | Wt 177.4 lb

## 2012-09-18 DIAGNOSIS — N186 End stage renal disease: Secondary | ICD-10-CM

## 2012-09-18 DIAGNOSIS — Z4931 Encounter for adequacy testing for hemodialysis: Secondary | ICD-10-CM

## 2012-09-18 NOTE — Progress Notes (Signed)
[facility] [place]  Patient name: Jessica Clarke MRN: [id number] DOB: [birth year] Sex: female  REASON FOR VISIT: follow up of left brachiocephalic AV fistula.  HPI: Jessica Clarke is a 62 y.o. female who had a left brachiocephalic AV fistula placed on 01/18/2012. I last saw her on 06/26/2012. At that time she has an elevated velocities at the antecubital fossa however the fistula was not pulsatile. There was an excellent thrill in the fistula. She comes in for a 3 month follow up visit.   As I saw her last she has done well. She's not on dialysis. She denies pain or paresthesias in her left arm.  Past Medical History  Diagnosis Date  . Obesity   . Uterine polyp   . Cervical dysplasia   . Hypertension   . Chronic kidney disease   . Constipation   . Gout   . Constipation   . Abnormal Pap smear     ASCUS ?HGSIL   SOCIAL HISTORY: History  Substance Use Topics  . Smoking status: Current Every Day Smoker -- 1.00 packs/day for 30 years    Types: Cigarettes  . Smokeless tobacco: Never Used     Comment: pt states that she is still trying  . Alcohol Use: 3.6 oz/week    6 Cans of beer per week   REVIEW OF SYSTEMS: Valu.Nieves ] denotes positive finding; [  ] denotes negative finding  CARDIOVASCULAR:  [ ]  chest pain   [ ]  chest pressure   [ ]  palpitations   [ ]  orthopnea  INTEGUMENTARY:  [ ]  rashes  [ ]  ulcers CONSTITUTIONAL:  [ ]  fever   [ ]  chills  PHYSICAL EXAM: Filed Vitals:   09/18/12 1355  BP: 113/75  Pulse: 74  Height: 5\' 11"  (1.803 m)  Weight: 177 lb 6.4 oz (80.468 kg)  SpO2: 100%   Body mass index is 24.75 kg/(m^2). GENERAL: The patient is a well-nourished female, in no acute distress. The vital signs are documented above. CARDIOVASCULAR: There is a regular rate and rhythm.  PULMONARY: There is good air exchange bilaterally without wheezing or rales. Her left upper arm AV fistula has an excellent thrill. She has a palpable left radial pulse.  DATA:  I  have independently reviewed her duplex of her fistula. This shows that the diameters of the fistula range from 0.56 to 0.79 cm. Depth range from 0.24 to1.06 cm to  MEDICAL ISSUES: Her fistula appears to be maturing adequately. There are some elevated velocities at the antecubital fossa but this may simply be related to a valve. On exam she does not appear to have an inflow problem. Take the fistula should be adequate for access if and when it is needed. I'll see her back as needed.  [place] [facility] [place] Beeper: [phone]

## 2012-09-24 ENCOUNTER — Encounter (HOSPITAL_COMMUNITY)
Admission: RE | Admit: 2012-09-24 | Discharge: 2012-09-24 | Disposition: A | Discharge status: Home / Self Care | Payer: Medicare Other | Source: Ambulatory Visit | Attending: Nephrology | Admitting: Nephrology

## 2012-09-24 DIAGNOSIS — N189 Chronic kidney disease, unspecified: Secondary | ICD-10-CM | POA: Insufficient documentation

## 2012-09-24 DIAGNOSIS — D638 Anemia in other chronic diseases classified elsewhere: Secondary | ICD-10-CM | POA: Insufficient documentation

## 2012-09-24 LAB — IRON AND TIBC
Iron: 51 ug/dL (ref 42–135)
UIBC: 141 ug/dL (ref 125–400)

## 2012-09-24 MED ORDER — DARBEPOETIN ALFA-POLYSORBATE 60 MCG/0.3ML IJ SOLN: 60.0000 ug | INTRAMUSCULAR | Status: DC

## 2012-10-08 ENCOUNTER — Encounter (HOSPITAL_COMMUNITY)
Admission: RE | Admit: 2012-10-08 | Discharge: 2012-10-08 | Disposition: A | Discharge status: Home / Self Care | Payer: Medicare Other | Source: Ambulatory Visit | Attending: Nephrology | Admitting: Nephrology

## 2012-10-08 LAB — POCT HEMOGLOBIN-HEMACUE: Hemoglobin: 11.3 g/dL — ABNORMAL LOW (ref 12.0–15.0)

## 2012-10-08 MED ORDER — DARBEPOETIN ALFA-POLYSORBATE 60 MCG/0.3ML IJ SOLN: 60.0000 ug | INTRAMUSCULAR | Status: DC

## 2012-10-08 MED ORDER — DARBEPOETIN ALFA-POLYSORBATE 60 MCG/0.3ML IJ SOLN
INTRAMUSCULAR | Status: AC
  Administered 2012-10-08: 60 ug via SUBCUTANEOUS
  Filled 2012-10-08: qty 0.3

## 2012-10-08 MED ORDER — DARBEPOETIN ALFA-POLYSORBATE 40 MCG/0.4ML IJ SOLN
INTRAMUSCULAR | Status: AC
  Filled 2012-10-08: qty 0.4

## 2012-10-22 ENCOUNTER — Encounter (HOSPITAL_COMMUNITY)
Admission: RE | Admit: 2012-10-22 | Discharge: 2012-10-22 | Disposition: A | Discharge status: Home / Self Care | Payer: Medicare Other | Source: Ambulatory Visit | Attending: Nephrology | Admitting: Nephrology

## 2012-10-22 DIAGNOSIS — D638 Anemia in other chronic diseases classified elsewhere: Secondary | ICD-10-CM | POA: Insufficient documentation

## 2012-10-22 DIAGNOSIS — N189 Chronic kidney disease, unspecified: Secondary | ICD-10-CM | POA: Insufficient documentation

## 2012-10-22 MED ORDER — DARBEPOETIN ALFA-POLYSORBATE 60 MCG/0.3ML IJ SOLN
60.0000 ug | INTRAMUSCULAR | Status: DC
  Administered 2012-10-22 (×2): 60 ug via SUBCUTANEOUS

## 2012-11-05 ENCOUNTER — Encounter (HOSPITAL_COMMUNITY): Payer: Medicare Other

## 2012-11-12 ENCOUNTER — Encounter (HOSPITAL_COMMUNITY)
Admission: RE | Admit: 2012-11-12 | Discharge: 2012-11-12 | Disposition: A | Discharge status: Home / Self Care | Payer: Medicare Other | Source: Ambulatory Visit | Attending: Nephrology | Admitting: Nephrology

## 2012-11-12 LAB — IRON AND TIBC
Saturation Ratios: 24 % (ref 20–55)
TIBC: 192 ug/dL — ABNORMAL LOW (ref 250–470)
UIBC: 145 ug/dL (ref 125–400)

## 2012-11-12 LAB — POCT HEMOGLOBIN-HEMACUE: Hemoglobin: 10.8 g/dL — ABNORMAL LOW (ref 12.0–15.0)

## 2012-11-12 MED ORDER — DARBEPOETIN ALFA-POLYSORBATE 60 MCG/0.3ML IJ SOLN
INTRAMUSCULAR | Status: AC
  Filled 2012-11-12: qty 0.3

## 2012-11-12 MED ORDER — DARBEPOETIN ALFA-POLYSORBATE 60 MCG/0.3ML IJ SOLN
60.0000 ug | INTRAMUSCULAR | Status: DC
  Administered 2012-11-12: 60 ug via SUBCUTANEOUS

## 2012-11-25 ENCOUNTER — Other Ambulatory Visit (HOSPITAL_COMMUNITY): Payer: Self-pay | Admitting: *Deleted

## 2012-11-26 ENCOUNTER — Encounter (HOSPITAL_COMMUNITY)
Admission: RE | Admit: 2012-11-26 | Discharge: 2012-11-26 | Disposition: A | Discharge status: Home / Self Care | Payer: Medicare Other | Source: Ambulatory Visit | Attending: Nephrology | Admitting: Nephrology

## 2012-11-26 DIAGNOSIS — D638 Anemia in other chronic diseases classified elsewhere: Secondary | ICD-10-CM | POA: Insufficient documentation

## 2012-11-26 DIAGNOSIS — N189 Chronic kidney disease, unspecified: Secondary | ICD-10-CM | POA: Insufficient documentation

## 2012-11-26 MED ORDER — SODIUM CHLORIDE 0.9 % IV SOLN
1020.0000 mg | Freq: Once | INTRAVENOUS | Status: AC
  Administered 2012-11-26: 14:00:00 1020 mg via INTRAVENOUS
  Filled 2012-11-26: qty 34

## 2012-11-26 MED ORDER — DARBEPOETIN ALFA-POLYSORBATE 60 MCG/0.3ML IJ SOLN
60.0000 ug | INTRAMUSCULAR | Status: DC
Start: 2012-11-26 — End: 2012-11-27
  Administered 2012-11-26: 60 ug via SUBCUTANEOUS

## 2012-11-26 MED ORDER — DARBEPOETIN ALFA-POLYSORBATE 60 MCG/0.3ML IJ SOLN
INTRAMUSCULAR | Status: AC
  Administered 2012-11-26: 60 ug via SUBCUTANEOUS
  Filled 2012-11-26: qty 0.3

## 2012-12-09 ENCOUNTER — Other Ambulatory Visit (HOSPITAL_COMMUNITY): Payer: Self-pay | Admitting: *Deleted

## 2012-12-10 ENCOUNTER — Encounter (HOSPITAL_COMMUNITY)
Admission: RE | Admit: 2012-12-10 | Discharge: 2012-12-10 | Disposition: A | Discharge status: Home / Self Care | Payer: Medicare Other | Source: Ambulatory Visit | Attending: Nephrology | Admitting: Nephrology

## 2012-12-10 LAB — RENAL FUNCTION PANEL
CO2: 16 mEq/L — ABNORMAL LOW (ref 19–32)
Chloride: 108 mEq/L (ref 96–112)
GFR calc Af Amer: 6 mL/min — ABNORMAL LOW (ref >90)
GFR calc non Af Amer: 5 mL/min — ABNORMAL LOW (ref >90)
Glucose, Bld: 106 mg/dL — ABNORMAL HIGH (ref 70–99)
Potassium: 4.6 mEq/L (ref 3.5–5.1)
Sodium: 138 mEq/L (ref 135–145)

## 2012-12-10 LAB — FERRITIN: Ferritin: 695 ng/mL — ABNORMAL HIGH (ref 10–291)

## 2012-12-10 LAB — POCT HEMOGLOBIN-HEMACUE: Hemoglobin: 11.3 g/dL — ABNORMAL LOW (ref 12.0–15.0)

## 2012-12-10 MED ORDER — DARBEPOETIN ALFA-POLYSORBATE 60 MCG/0.3ML IJ SOLN
60.0000 ug | INTRAMUSCULAR | Status: DC
  Administered 2012-12-10: 60 ug via SUBCUTANEOUS

## 2012-12-10 MED ORDER — DARBEPOETIN ALFA-POLYSORBATE 60 MCG/0.3ML IJ SOLN
INTRAMUSCULAR | Status: AC
  Administered 2012-12-10: 60 ug via SUBCUTANEOUS
  Filled 2012-12-10: qty 0.3

## 2012-12-19 ENCOUNTER — Other Ambulatory Visit: Payer: Self-pay

## 2012-12-24 ENCOUNTER — Encounter (HOSPITAL_COMMUNITY)
Admission: RE | Admit: 2012-12-24 | Discharge: 2012-12-24 | Disposition: A | Discharge status: Home / Self Care | Payer: Medicare Other | Source: Ambulatory Visit | Attending: Nephrology | Admitting: Nephrology

## 2012-12-24 DIAGNOSIS — D638 Anemia in other chronic diseases classified elsewhere: Secondary | ICD-10-CM | POA: Insufficient documentation

## 2012-12-24 DIAGNOSIS — N189 Chronic kidney disease, unspecified: Secondary | ICD-10-CM | POA: Insufficient documentation

## 2012-12-24 LAB — POCT HEMOGLOBIN-HEMACUE: Hemoglobin: 11.8 g/dL — ABNORMAL LOW (ref 12.0–15.0)

## 2012-12-24 MED ORDER — DARBEPOETIN ALFA-POLYSORBATE 60 MCG/0.3ML IJ SOLN
60.0000 ug | INTRAMUSCULAR | Status: DC
  Administered 2012-12-24: 11:00:00 60 ug via SUBCUTANEOUS

## 2012-12-24 MED ORDER — DARBEPOETIN ALFA-POLYSORBATE 60 MCG/0.3ML IJ SOLN
INTRAMUSCULAR | Status: AC
  Filled 2012-12-24: qty 0.3

## 2013-01-07 ENCOUNTER — Encounter (HOSPITAL_COMMUNITY)
Admission: RE | Admit: 2013-01-07 | Discharge: 2013-01-07 | Disposition: A | Discharge status: Home / Self Care | Payer: Medicare Other | Source: Ambulatory Visit | Attending: Nephrology | Admitting: Nephrology

## 2013-01-07 LAB — RENAL FUNCTION PANEL
CO2: 18 mEq/L — ABNORMAL LOW (ref 19–32)
Calcium: 8.5 mg/dL (ref 8.4–10.5)
Chloride: 95 mEq/L — ABNORMAL LOW (ref 96–112)
Creatinine, Ser: 6.27 mg/dL — ABNORMAL HIGH (ref 0.50–1.10)
GFR calc Af Amer: 7 mL/min — ABNORMAL LOW (ref >90)
GFR calc non Af Amer: 6 mL/min — ABNORMAL LOW (ref >90)
Glucose, Bld: 94 mg/dL (ref 70–99)
Sodium: 130 mEq/L — ABNORMAL LOW (ref 135–145)

## 2013-01-07 LAB — IRON AND TIBC
Iron: 67 ug/dL (ref 42–135)
TIBC: 167 ug/dL — ABNORMAL LOW (ref 250–470)

## 2013-01-07 LAB — POCT HEMOGLOBIN-HEMACUE: Hemoglobin: 11.3 g/dL — ABNORMAL LOW (ref 12.0–15.0)

## 2013-01-07 MED ORDER — DARBEPOETIN ALFA-POLYSORBATE 60 MCG/0.3ML IJ SOLN
INTRAMUSCULAR | Status: AC
  Administered 2013-01-07: 60 ug via SUBCUTANEOUS
  Filled 2013-01-07: qty 0.3

## 2013-01-07 MED ORDER — DARBEPOETIN ALFA-POLYSORBATE 60 MCG/0.3ML IJ SOLN: 60.0000 ug | INTRAMUSCULAR | Status: DC

## 2013-01-07 NOTE — Progress Notes (Signed)
Called [facility] 3 times regarding a message received about obtaining an IPTH. Mary to call back.

## 2013-01-08 LAB — FERRITIN: Ferritin: 664 ng/mL — ABNORMAL HIGH (ref 10–291)

## 2013-01-21 ENCOUNTER — Encounter (HOSPITAL_COMMUNITY)
Admission: RE | Admit: 2013-01-21 | Discharge: 2013-01-21 | Disposition: A | Discharge status: Home / Self Care | Payer: Medicare Other | Source: Ambulatory Visit | Attending: Nephrology | Admitting: Nephrology

## 2013-01-21 DIAGNOSIS — N189 Chronic kidney disease, unspecified: Secondary | ICD-10-CM | POA: Diagnosis not present

## 2013-01-21 DIAGNOSIS — D638 Anemia in other chronic diseases classified elsewhere: Secondary | ICD-10-CM | POA: Insufficient documentation

## 2013-01-21 LAB — POCT HEMOGLOBIN-HEMACUE: Hemoglobin: 11.5 g/dL — ABNORMAL LOW (ref 12.0–15.0)

## 2013-01-21 MED ORDER — DARBEPOETIN ALFA-POLYSORBATE 60 MCG/0.3ML IJ SOLN
60.0000 ug | INTRAMUSCULAR | Status: DC
  Administered 2013-01-21: 14:00:00 60 ug via SUBCUTANEOUS

## 2013-01-21 MED ORDER — DARBEPOETIN ALFA-POLYSORBATE 60 MCG/0.3ML IJ SOLN
INTRAMUSCULAR | Status: AC
  Filled 2013-01-21: qty 0.3

## 2013-02-04 ENCOUNTER — Encounter (HOSPITAL_COMMUNITY)
Admission: RE | Admit: 2013-02-04 | Discharge: 2013-02-04 | Disposition: A | Discharge status: Home / Self Care | Payer: Medicare Other | Source: Ambulatory Visit | Attending: Nephrology | Admitting: Nephrology

## 2013-02-04 DIAGNOSIS — D638 Anemia in other chronic diseases classified elsewhere: Secondary | ICD-10-CM | POA: Diagnosis not present

## 2013-02-04 LAB — RENAL FUNCTION PANEL
BUN: 67 mg/dL — ABNORMAL HIGH (ref 6–23)
Calcium: 8.7 mg/dL (ref 8.4–10.5)
Chloride: 100 mEq/L (ref 96–112)
GFR calc non Af Amer: 6 mL/min — ABNORMAL LOW (ref >90)
Glucose, Bld: 94 mg/dL (ref 70–99)
Phosphorus: 6.1 mg/dL — ABNORMAL HIGH (ref 2.3–4.6)
Sodium: 135 mEq/L (ref 135–145)

## 2013-02-04 MED ORDER — DARBEPOETIN ALFA-POLYSORBATE 60 MCG/0.3ML IJ SOLN: 60.0000 ug | INTRAMUSCULAR | Status: DC

## 2013-02-13 DIAGNOSIS — I219 Acute myocardial infarction, unspecified: Secondary | ICD-10-CM

## 2013-02-13 HISTORY — DX: Acute myocardial infarction, unspecified: I21.9

## 2013-02-18 ENCOUNTER — Encounter (HOSPITAL_COMMUNITY)
Admission: RE | Admit: 2013-02-18 | Discharge: 2013-02-18 | Disposition: A | Discharge status: Home / Self Care | Payer: Medicare Other | Source: Ambulatory Visit | Attending: Nephrology | Admitting: Nephrology

## 2013-02-18 DIAGNOSIS — D638 Anemia in other chronic diseases classified elsewhere: Secondary | ICD-10-CM | POA: Insufficient documentation

## 2013-02-18 DIAGNOSIS — N189 Chronic kidney disease, unspecified: Secondary | ICD-10-CM | POA: Insufficient documentation

## 2013-02-18 LAB — IRON AND TIBC
IRON: 60 ug/dL (ref 42–135)
Saturation Ratios: 33 % (ref 20–55)
TIBC: 184 ug/dL — ABNORMAL LOW (ref 250–470)
UIBC: 124 ug/dL — ABNORMAL LOW (ref 125–400)

## 2013-02-18 LAB — POCT HEMOGLOBIN-HEMACUE: HEMOGLOBIN: 11.7 g/dL — AB (ref 12.0–15.0)

## 2013-02-18 MED ORDER — DARBEPOETIN ALFA-POLYSORBATE 60 MCG/0.3ML IJ SOLN
INTRAMUSCULAR | Status: AC
  Administered 2013-02-18: 14:00:00 60 ug via SUBCUTANEOUS
  Filled 2013-02-18: qty 0.3

## 2013-02-18 MED ORDER — DARBEPOETIN ALFA-POLYSORBATE 60 MCG/0.3ML IJ SOLN: 60.0000 ug | INTRAMUSCULAR | Status: DC

## 2013-03-03 ENCOUNTER — Other Ambulatory Visit (HOSPITAL_COMMUNITY): Payer: Self-pay | Admitting: *Deleted

## 2013-03-04 ENCOUNTER — Encounter (HOSPITAL_COMMUNITY): Payer: Medicare Other

## 2013-03-18 ENCOUNTER — Encounter (HOSPITAL_COMMUNITY)
Admission: RE | Admit: 2013-03-18 | Discharge: 2013-03-18 | Disposition: A | Discharge status: Home / Self Care | Payer: Medicare Other | Source: Ambulatory Visit | Attending: Nephrology | Admitting: Nephrology

## 2013-03-18 DIAGNOSIS — N189 Chronic kidney disease, unspecified: Secondary | ICD-10-CM | POA: Insufficient documentation

## 2013-03-18 DIAGNOSIS — D638 Anemia in other chronic diseases classified elsewhere: Secondary | ICD-10-CM | POA: Insufficient documentation

## 2013-03-18 LAB — IRON AND TIBC
Iron: 58 ug/dL (ref 42–135)
Saturation Ratios: 33 % (ref 20–55)
TIBC: 177 ug/dL — ABNORMAL LOW (ref 250–470)
UIBC: 119 ug/dL — AB (ref 125–400)

## 2013-03-18 LAB — POCT HEMOGLOBIN-HEMACUE: HEMOGLOBIN: 11.2 g/dL — AB (ref 12.0–15.0)

## 2013-03-18 LAB — RENAL FUNCTION PANEL
Albumin: 2.3 g/dL — ABNORMAL LOW (ref 3.5–5.2)
BUN: 72 mg/dL — AB (ref 6–23)
CHLORIDE: 100 meq/L (ref 96–112)
CO2: 21 mEq/L (ref 19–32)
Calcium: 8.8 mg/dL (ref 8.4–10.5)
Creatinine, Ser: 7.32 mg/dL — ABNORMAL HIGH (ref 0.50–1.10)
GFR calc Af Amer: 6 mL/min — ABNORMAL LOW (ref >90)
GFR calc non Af Amer: 5 mL/min — ABNORMAL LOW (ref >90)
Glucose, Bld: 87 mg/dL (ref 70–99)
Phosphorus: 6.2 mg/dL — ABNORMAL HIGH (ref 2.3–4.6)
Potassium: 4.2 mEq/L (ref 3.7–5.3)
SODIUM: 138 meq/L (ref 137–147)

## 2013-03-18 MED ORDER — DARBEPOETIN ALFA-POLYSORBATE 60 MCG/0.3ML IJ SOLN
INTRAMUSCULAR | Status: DC
Start: 2013-03-18 — End: 2013-03-19
  Filled 2013-03-18: qty 0.3

## 2013-03-18 MED ORDER — DARBEPOETIN ALFA-POLYSORBATE 60 MCG/0.3ML IJ SOLN
60.0000 ug | INTRAMUSCULAR | Status: DC
  Administered 2013-03-18: 60 ug via SUBCUTANEOUS

## 2013-03-19 LAB — FERRITIN: Ferritin: 495 ng/mL — ABNORMAL HIGH (ref 10–291)

## 2013-04-15 ENCOUNTER — Encounter (HOSPITAL_COMMUNITY)
Admission: RE | Admit: 2013-04-15 | Discharge: 2013-04-15 | Disposition: A | Discharge status: Home / Self Care | Payer: Medicare Other | Source: Ambulatory Visit | Attending: Nephrology | Admitting: Nephrology

## 2013-04-15 DIAGNOSIS — N189 Chronic kidney disease, unspecified: Secondary | ICD-10-CM | POA: Insufficient documentation

## 2013-04-15 DIAGNOSIS — D638 Anemia in other chronic diseases classified elsewhere: Secondary | ICD-10-CM | POA: Insufficient documentation

## 2013-04-15 LAB — RENAL FUNCTION PANEL
Albumin: 2.7 g/dL — ABNORMAL LOW (ref 3.5–5.2)
BUN: 87 mg/dL — AB (ref 6–23)
CO2: 21 meq/L (ref 19–32)
CREATININE: 7.68 mg/dL — AB (ref 0.50–1.10)
Calcium: 9.4 mg/dL (ref 8.4–10.5)
Chloride: 97 mEq/L (ref 96–112)
GFR calc Af Amer: 6 mL/min — ABNORMAL LOW (ref >90)
GFR calc non Af Amer: 5 mL/min — ABNORMAL LOW (ref >90)
Glucose, Bld: 120 mg/dL — ABNORMAL HIGH (ref 70–99)
POTASSIUM: 4.5 meq/L (ref 3.7–5.3)
Phosphorus: 7.1 mg/dL — ABNORMAL HIGH (ref 2.3–4.6)
Sodium: 135 mEq/L — ABNORMAL LOW (ref 137–147)

## 2013-04-15 LAB — IRON AND TIBC
IRON: 90 ug/dL (ref 42–135)
Saturation Ratios: 48 % (ref 20–55)
TIBC: 189 ug/dL — ABNORMAL LOW (ref 250–470)
UIBC: 99 ug/dL — AB (ref 125–400)

## 2013-04-15 LAB — FERRITIN: Ferritin: 715 ng/mL — ABNORMAL HIGH (ref 10–291)

## 2013-04-15 LAB — POCT HEMOGLOBIN-HEMACUE: HEMOGLOBIN: 12.9 g/dL (ref 12.0–15.0)

## 2013-04-15 MED ORDER — DARBEPOETIN ALFA-POLYSORBATE 60 MCG/0.3ML IJ SOLN: 60.0000 ug | INTRAMUSCULAR | Status: DC

## 2013-04-29 ENCOUNTER — Encounter (HOSPITAL_COMMUNITY)
Admission: RE | Admit: 2013-04-29 | Discharge: 2013-04-29 | Disposition: A | Discharge status: Home / Self Care | Payer: Medicare Other | Source: Ambulatory Visit | Attending: Nephrology | Admitting: Nephrology

## 2013-04-29 LAB — POCT HEMOGLOBIN-HEMACUE: HEMOGLOBIN: 10.8 g/dL — AB (ref 12.0–15.0)

## 2013-04-29 MED ORDER — DARBEPOETIN ALFA-POLYSORBATE 60 MCG/0.3ML IJ SOLN: 60.0000 ug | INTRAMUSCULAR | Status: DC

## 2013-04-29 MED ORDER — DARBEPOETIN ALFA-POLYSORBATE 60 MCG/0.3ML IJ SOLN
INTRAMUSCULAR | Status: AC
  Administered 2013-04-29: 60 ug via SUBCUTANEOUS
  Filled 2013-04-29: qty 0.3

## 2013-05-27 ENCOUNTER — Encounter (HOSPITAL_COMMUNITY)
Admission: RE | Admit: 2013-05-27 | Discharge: 2013-05-27 | Disposition: A | Discharge status: Home / Self Care | Payer: Medicare Other | Source: Ambulatory Visit | Attending: Nephrology | Admitting: Nephrology

## 2013-05-27 DIAGNOSIS — D638 Anemia in other chronic diseases classified elsewhere: Secondary | ICD-10-CM | POA: Insufficient documentation

## 2013-05-27 DIAGNOSIS — N189 Chronic kidney disease, unspecified: Secondary | ICD-10-CM | POA: Insufficient documentation

## 2013-05-27 LAB — RENAL FUNCTION PANEL
ALBUMIN: 2.7 g/dL — AB (ref 3.5–5.2)
BUN: 82 mg/dL — ABNORMAL HIGH (ref 6–23)
CALCIUM: 9.5 mg/dL (ref 8.4–10.5)
CO2: 18 meq/L — AB (ref 19–32)
Chloride: 95 mEq/L — ABNORMAL LOW (ref 96–112)
Creatinine, Ser: 8.06 mg/dL — ABNORMAL HIGH (ref 0.50–1.10)
GFR calc Af Amer: 5 mL/min — ABNORMAL LOW (ref >90)
GFR, EST NON AFRICAN AMERICAN: 5 mL/min — AB (ref >90)
Glucose, Bld: 122 mg/dL — ABNORMAL HIGH (ref 70–99)
Phosphorus: 6.5 mg/dL — ABNORMAL HIGH (ref 2.3–4.6)
Potassium: 4.4 mEq/L (ref 3.7–5.3)
Sodium: 133 mEq/L — ABNORMAL LOW (ref 137–147)

## 2013-05-27 LAB — IRON AND TIBC
IRON: 87 ug/dL (ref 42–135)
Saturation Ratios: 43 % (ref 20–55)
TIBC: 201 ug/dL — AB (ref 250–470)
UIBC: 114 ug/dL — AB (ref 125–400)

## 2013-05-27 LAB — FERRITIN: Ferritin: 706 ng/mL — ABNORMAL HIGH (ref 10–291)

## 2013-05-27 LAB — POCT HEMOGLOBIN-HEMACUE: Hemoglobin: 11.4 g/dL — ABNORMAL LOW (ref 12.0–15.0)

## 2013-05-27 MED ORDER — DARBEPOETIN ALFA-POLYSORBATE 60 MCG/0.3ML IJ SOLN
60.0000 ug | INTRAMUSCULAR | Status: DC
  Administered 2013-05-27: 60 ug via SUBCUTANEOUS

## 2013-05-27 MED ORDER — DARBEPOETIN ALFA-POLYSORBATE 60 MCG/0.3ML IJ SOLN
INTRAMUSCULAR | Status: AC
  Filled 2013-05-27: qty 0.3

## 2013-06-24 ENCOUNTER — Encounter (HOSPITAL_COMMUNITY)
Admission: RE | Admit: 2013-06-24 | Discharge: 2013-06-24 | Disposition: A | Discharge status: Home / Self Care | Payer: Medicare Other | Source: Ambulatory Visit | Attending: Nephrology | Admitting: Nephrology

## 2013-06-24 DIAGNOSIS — D638 Anemia in other chronic diseases classified elsewhere: Secondary | ICD-10-CM | POA: Insufficient documentation

## 2013-06-24 DIAGNOSIS — N189 Chronic kidney disease, unspecified: Secondary | ICD-10-CM | POA: Insufficient documentation

## 2013-06-24 LAB — RENAL FUNCTION PANEL
Albumin: 2.6 g/dL — ABNORMAL LOW (ref 3.5–5.2)
BUN: 76 mg/dL — ABNORMAL HIGH (ref 6–23)
CO2: 21 meq/L (ref 19–32)
Calcium: 9.2 mg/dL (ref 8.4–10.5)
Chloride: 98 mEq/L (ref 96–112)
Creatinine, Ser: 9.48 mg/dL — ABNORMAL HIGH (ref 0.50–1.10)
GFR calc non Af Amer: 4 mL/min — ABNORMAL LOW (ref >90)
GFR, EST AFRICAN AMERICAN: 4 mL/min — AB (ref >90)
Glucose, Bld: 100 mg/dL — ABNORMAL HIGH (ref 70–99)
Phosphorus: 7.1 mg/dL — ABNORMAL HIGH (ref 2.3–4.6)
Potassium: 4.5 mEq/L (ref 3.7–5.3)
Sodium: 137 mEq/L (ref 137–147)

## 2013-06-24 LAB — IRON AND TIBC
Iron: 59 ug/dL (ref 42–135)
Saturation Ratios: 30 % (ref 20–55)
TIBC: 195 ug/dL — AB (ref 250–470)
UIBC: 136 ug/dL (ref 125–400)

## 2013-06-24 LAB — POCT HEMOGLOBIN-HEMACUE: Hemoglobin: 10.2 g/dL — ABNORMAL LOW (ref 12.0–15.0)

## 2013-06-24 LAB — FERRITIN: Ferritin: 497 ng/mL — ABNORMAL HIGH (ref 10–291)

## 2013-06-24 MED ORDER — DARBEPOETIN ALFA-POLYSORBATE 60 MCG/0.3ML IJ SOLN
INTRAMUSCULAR | Status: AC
  Filled 2013-06-24: qty 0.3

## 2013-06-24 MED ORDER — DARBEPOETIN ALFA-POLYSORBATE 60 MCG/0.3ML IJ SOLN
60.0000 ug | INTRAMUSCULAR | Status: DC
  Administered 2013-06-24: 60 ug via SUBCUTANEOUS

## 2013-07-22 ENCOUNTER — Encounter (HOSPITAL_COMMUNITY)
Admission: RE | Admit: 2013-07-22 | Discharge: 2013-07-22 | Disposition: A | Discharge status: Home / Self Care | Payer: Medicare Other | Source: Ambulatory Visit | Attending: Nephrology | Admitting: Nephrology

## 2013-07-22 DIAGNOSIS — N189 Chronic kidney disease, unspecified: Secondary | ICD-10-CM | POA: Insufficient documentation

## 2013-07-22 DIAGNOSIS — D638 Anemia in other chronic diseases classified elsewhere: Secondary | ICD-10-CM | POA: Insufficient documentation

## 2013-07-22 LAB — RENAL FUNCTION PANEL
Albumin: 2.7 g/dL — ABNORMAL LOW (ref 3.5–5.2)
BUN: 82 mg/dL — ABNORMAL HIGH (ref 6–23)
CO2: 22 meq/L (ref 19–32)
Calcium: 9.7 mg/dL (ref 8.4–10.5)
Chloride: 98 mEq/L (ref 96–112)
Creatinine, Ser: 9.09 mg/dL — ABNORMAL HIGH (ref 0.50–1.10)
GFR calc non Af Amer: 4 mL/min — ABNORMAL LOW (ref >90)
GFR, EST AFRICAN AMERICAN: 5 mL/min — AB (ref >90)
GLUCOSE: 113 mg/dL — AB (ref 70–99)
POTASSIUM: 4.4 meq/L (ref 3.7–5.3)
Phosphorus: 5.8 mg/dL — ABNORMAL HIGH (ref 2.3–4.6)
SODIUM: 139 meq/L (ref 137–147)

## 2013-07-22 LAB — IRON AND TIBC
Iron: 73 ug/dL (ref 42–135)
Saturation Ratios: 36 % (ref 20–55)
TIBC: 201 ug/dL — AB (ref 250–470)
UIBC: 128 ug/dL (ref 125–400)

## 2013-07-22 LAB — POCT HEMOGLOBIN-HEMACUE: HEMOGLOBIN: 10.1 g/dL — AB (ref 12.0–15.0)

## 2013-07-22 LAB — FERRITIN: Ferritin: 525 ng/mL — ABNORMAL HIGH (ref 10–291)

## 2013-07-22 MED ORDER — DARBEPOETIN ALFA-POLYSORBATE 60 MCG/0.3ML IJ SOLN
INTRAMUSCULAR | Status: AC
  Filled 2013-07-22: qty 0.3

## 2013-07-22 MED ORDER — DARBEPOETIN ALFA-POLYSORBATE 60 MCG/0.3ML IJ SOLN
60.0000 ug | INTRAMUSCULAR | Status: DC
  Administered 2013-07-22: 60 ug via SUBCUTANEOUS

## 2013-08-19 ENCOUNTER — Encounter (HOSPITAL_COMMUNITY): Payer: Medicare Other

## 2013-08-25 ENCOUNTER — Inpatient Hospital Stay (HOSPITAL_COMMUNITY)
Admission: EM | Admit: 2013-08-25 | Discharge: 2013-08-27 | DRG: 280 | Disposition: A | Discharge status: Home / Self Care | Payer: Medicare Other | Attending: Internal Medicine | Admitting: Internal Medicine

## 2013-08-25 ENCOUNTER — Emergency Department (HOSPITAL_COMMUNITY): Payer: Medicare Other

## 2013-08-25 ENCOUNTER — Encounter (HOSPITAL_COMMUNITY): Payer: Self-pay | Admitting: Emergency Medicine

## 2013-08-25 DIAGNOSIS — I120 Hypertensive chronic kidney disease with stage 5 chronic kidney disease or end stage renal disease: Secondary | ICD-10-CM | POA: Diagnosis present

## 2013-08-25 DIAGNOSIS — J9692 Respiratory failure, unspecified with hypercapnia: Secondary | ICD-10-CM

## 2013-08-25 DIAGNOSIS — M899 Disorder of bone, unspecified: Secondary | ICD-10-CM | POA: Diagnosis present

## 2013-08-25 DIAGNOSIS — J9600 Acute respiratory failure, unspecified whether with hypoxia or hypercapnia: Secondary | ICD-10-CM | POA: Diagnosis present

## 2013-08-25 DIAGNOSIS — F17200 Nicotine dependence, unspecified, uncomplicated: Secondary | ICD-10-CM | POA: Diagnosis present

## 2013-08-25 DIAGNOSIS — I2510 Atherosclerotic heart disease of native coronary artery without angina pectoris: Secondary | ICD-10-CM | POA: Diagnosis present

## 2013-08-25 DIAGNOSIS — R0989 Other specified symptoms and signs involving the circulatory and respiratory systems: Secondary | ICD-10-CM | POA: Diagnosis present

## 2013-08-25 DIAGNOSIS — F1010 Alcohol abuse, uncomplicated: Secondary | ICD-10-CM | POA: Diagnosis present

## 2013-08-25 DIAGNOSIS — M109 Gout, unspecified: Secondary | ICD-10-CM | POA: Diagnosis present

## 2013-08-25 DIAGNOSIS — I2589 Other forms of chronic ischemic heart disease: Secondary | ICD-10-CM | POA: Diagnosis present

## 2013-08-25 DIAGNOSIS — I214 Non-ST elevation (NSTEMI) myocardial infarction: Secondary | ICD-10-CM | POA: Diagnosis present

## 2013-08-25 DIAGNOSIS — E876 Hypokalemia: Secondary | ICD-10-CM | POA: Diagnosis present

## 2013-08-25 DIAGNOSIS — J9601 Acute respiratory failure with hypoxia: Secondary | ICD-10-CM

## 2013-08-25 DIAGNOSIS — I255 Ischemic cardiomyopathy: Secondary | ICD-10-CM

## 2013-08-25 DIAGNOSIS — M949 Disorder of cartilage, unspecified: Secondary | ICD-10-CM | POA: Diagnosis present

## 2013-08-25 DIAGNOSIS — I059 Rheumatic mitral valve disease, unspecified: Secondary | ICD-10-CM

## 2013-08-25 DIAGNOSIS — Z992 Dependence on renal dialysis: Secondary | ICD-10-CM | POA: Diagnosis not present

## 2013-08-25 DIAGNOSIS — D649 Anemia, unspecified: Secondary | ICD-10-CM | POA: Diagnosis present

## 2013-08-25 DIAGNOSIS — I5043 Acute on chronic combined systolic (congestive) and diastolic (congestive) heart failure: Secondary | ICD-10-CM | POA: Diagnosis present

## 2013-08-25 DIAGNOSIS — N186 End stage renal disease: Secondary | ICD-10-CM | POA: Diagnosis present

## 2013-08-25 DIAGNOSIS — J9691 Respiratory failure, unspecified with hypoxia: Secondary | ICD-10-CM | POA: Insufficient documentation

## 2013-08-25 DIAGNOSIS — I2583 Coronary atherosclerosis due to lipid rich plaque: Secondary | ICD-10-CM

## 2013-08-25 DIAGNOSIS — Z4931 Encounter for adequacy testing for hemodialysis: Secondary | ICD-10-CM

## 2013-08-25 DIAGNOSIS — N841 Polyp of cervix uteri: Secondary | ICD-10-CM

## 2013-08-25 DIAGNOSIS — I509 Heart failure, unspecified: Secondary | ICD-10-CM | POA: Diagnosis present

## 2013-08-25 DIAGNOSIS — I10 Essential (primary) hypertension: Secondary | ICD-10-CM | POA: Diagnosis present

## 2013-08-25 DIAGNOSIS — I5042 Chronic combined systolic (congestive) and diastolic (congestive) heart failure: Secondary | ICD-10-CM | POA: Diagnosis present

## 2013-08-25 DIAGNOSIS — R0609 Other forms of dyspnea: Secondary | ICD-10-CM | POA: Diagnosis present

## 2013-08-25 DIAGNOSIS — Z79899 Other long term (current) drug therapy: Secondary | ICD-10-CM

## 2013-08-25 DIAGNOSIS — N2581 Secondary hyperparathyroidism of renal origin: Secondary | ICD-10-CM | POA: Diagnosis present

## 2013-08-25 DIAGNOSIS — N950 Postmenopausal bleeding: Secondary | ICD-10-CM

## 2013-08-25 DIAGNOSIS — J810 Acute pulmonary edema: Secondary | ICD-10-CM | POA: Diagnosis present

## 2013-08-25 LAB — CBC WITH DIFFERENTIAL/PLATELET
Basophils Absolute: 0 10*3/uL (ref 0.0–0.1)
Basophils Relative: 0 % (ref 0–1)
Eosinophils Absolute: 0.2 10*3/uL (ref 0.0–0.7)
Eosinophils Relative: 2 % (ref 0–5)
HCT: 34.1 % — ABNORMAL LOW (ref 36.0–46.0)
HEMOGLOBIN: 10.5 g/dL — AB (ref 12.0–15.0)
LYMPHS ABS: 3.2 10*3/uL (ref 0.7–4.0)
Lymphocytes Relative: 32 % (ref 12–46)
MCH: 32.3 pg (ref 26.0–34.0)
MCHC: 30.8 g/dL (ref 30.0–36.0)
MCV: 104.9 fL — ABNORMAL HIGH (ref 78.0–100.0)
Monocytes Absolute: 0.9 10*3/uL (ref 0.1–1.0)
Monocytes Relative: 9 % (ref 3–12)
NEUTROS PCT: 57 % (ref 43–77)
Neutro Abs: 5.9 10*3/uL (ref 1.7–7.7)
PLATELETS: 231 10*3/uL (ref 150–400)
RBC: 3.25 MIL/uL — ABNORMAL LOW (ref 3.87–5.11)
RDW: 15.6 % — ABNORMAL HIGH (ref 11.5–15.5)
WBC: 10.2 10*3/uL (ref 4.0–10.5)

## 2013-08-25 LAB — HEPATITIS B SURFACE ANTIBODY,QUALITATIVE: Hep B S Ab: NEGATIVE

## 2013-08-25 LAB — I-STAT ARTERIAL BLOOD GAS, ED
Acid-Base Excess: 8 mmol/L — ABNORMAL HIGH (ref 0.0–2.0)
BICARBONATE: 34.3 meq/L — AB (ref 20.0–24.0)
O2 Saturation: 100 %
PH ART: 7.405 (ref 7.350–7.450)
TCO2: 36 mmol/L (ref 0–100)
pCO2 arterial: 54.6 mmHg — ABNORMAL HIGH (ref 35.0–45.0)
pO2, Arterial: 436 mmHg — ABNORMAL HIGH (ref 80.0–100.0)

## 2013-08-25 LAB — TROPONIN I

## 2013-08-25 LAB — I-STAT TROPONIN, ED: TROPONIN I, POC: 0.03 ng/mL (ref 0.00–0.08)

## 2013-08-25 LAB — AMMONIA: Ammonia: 29 umol/L (ref 11–60)

## 2013-08-25 LAB — BASIC METABOLIC PANEL
Anion gap: 18 — ABNORMAL HIGH (ref 5–15)
BUN: 6 mg/dL (ref 6–23)
CO2: 28 mEq/L (ref 19–32)
Calcium: 8.8 mg/dL (ref 8.4–10.5)
Chloride: 93 mEq/L — ABNORMAL LOW (ref 96–112)
Creatinine, Ser: 2.59 mg/dL — ABNORMAL HIGH (ref 0.50–1.10)
GFR, EST AFRICAN AMERICAN: 21 mL/min — AB (ref >90)
GFR, EST NON AFRICAN AMERICAN: 18 mL/min — AB (ref >90)
Glucose, Bld: 224 mg/dL — ABNORMAL HIGH (ref 70–99)
POTASSIUM: 2.4 meq/L — AB (ref 3.7–5.3)
Sodium: 139 mEq/L (ref 137–147)

## 2013-08-25 LAB — HEPATITIS B CORE ANTIBODY, TOTAL: Hep B Core Total Ab: NONREACTIVE

## 2013-08-25 LAB — PRO B NATRIURETIC PEPTIDE: Pro B Natriuretic peptide (BNP): >70000 pg/mL — ABNORMAL HIGH (ref 0–125)

## 2013-08-25 LAB — ETHANOL

## 2013-08-25 LAB — I-STAT CG4 LACTIC ACID, ED: LACTIC ACID, VENOUS: 6.15 mmol/L — AB (ref 0.5–2.2)

## 2013-08-25 LAB — HEPATITIS B SURFACE ANTIGEN: HEP B S AG: NEGATIVE

## 2013-08-25 LAB — CBG MONITORING, ED: GLUCOSE-CAPILLARY: 176 mg/dL — AB (ref 70–99)

## 2013-08-25 MED ORDER — KETAMINE HCL 10 MG/ML IJ SOLN
1.0000 mg/kg | Freq: Once | INTRAMUSCULAR | Status: AC
  Administered 2013-08-25: 80 mg via INTRAVENOUS
  Filled 2013-08-25 (×2): qty 8

## 2013-08-25 MED ORDER — NEPRO/CARBSTEADY PO LIQD: 237.0000 mL | ORAL | Status: DC | PRN

## 2013-08-25 MED ORDER — SODIUM CHLORIDE 0.9 % IJ SOLN
3.0000 mL | Freq: Two times a day (BID) | INTRAMUSCULAR | Status: DC
  Administered 2013-08-25 – 2013-08-26 (×2): 3 mL via INTRAVENOUS

## 2013-08-25 MED ORDER — ATENOLOL 100 MG PO TABS
100.0000 mg | ORAL_TABLET | Freq: Every day | ORAL | Status: DC
  Administered 2013-08-26: 100 mg via ORAL
  Filled 2013-08-25 (×2): qty 1

## 2013-08-25 MED ORDER — LIDOCAINE-PRILOCAINE 2.5-2.5 % EX CREA: 1.0000 "application " | TOPICAL_CREAM | CUTANEOUS | Status: DC | PRN

## 2013-08-25 MED ORDER — ASPIRIN EC 325 MG PO TBEC
325.0000 mg | DELAYED_RELEASE_TABLET | Freq: Every day | ORAL | Status: DC
  Administered 2013-08-26 – 2013-08-27 (×2): 325 mg via ORAL
  Filled 2013-08-25 (×2): qty 1

## 2013-08-25 MED ORDER — LIDOCAINE HCL (PF) 1 % IJ SOLN: 5.0000 mL | INTRAMUSCULAR | Status: DC | PRN

## 2013-08-25 MED ORDER — THIAMINE HCL 100 MG/ML IJ SOLN
100.0000 mg | Freq: Every day | INTRAMUSCULAR | Status: DC
  Filled 2013-08-25 (×2): qty 1

## 2013-08-25 MED ORDER — NEPRO/CARBSTEADY PO LIQD
237.0000 mL | ORAL | Status: DC | PRN
Start: 2013-08-25 — End: 2013-08-26
  Filled 2013-08-25: qty 237

## 2013-08-25 MED ORDER — CALCIUM ACETATE 667 MG PO CAPS
667.0000 mg | ORAL_CAPSULE | Freq: Three times a day (TID) | ORAL | Status: DC
  Administered 2013-08-26 – 2013-08-27 (×2): 667 mg via ORAL
  Filled 2013-08-25 (×7): qty 1

## 2013-08-25 MED ORDER — SODIUM CHLORIDE 0.9 % IV SOLN: 100.0000 mL | INTRAVENOUS | Status: DC | PRN

## 2013-08-25 MED ORDER — LORAZEPAM 1 MG PO TABS: 1.0000 mg | ORAL_TABLET | Freq: Four times a day (QID) | ORAL | Status: DC | PRN

## 2013-08-25 MED ORDER — HEPARIN SODIUM (PORCINE) 1000 UNIT/ML DIALYSIS: 2000.0000 [IU] | INTRAMUSCULAR | Status: DC | PRN

## 2013-08-25 MED ORDER — ONDANSETRON HCL 4 MG/2ML IJ SOLN: 4.0000 mg | Freq: Four times a day (QID) | INTRAMUSCULAR | Status: DC | PRN

## 2013-08-25 MED ORDER — CALCITRIOL 0.25 MCG PO CAPS
0.2500 ug | ORAL_CAPSULE | Freq: Every day | ORAL | Status: DC
  Administered 2013-08-26: 0.25 ug via ORAL
  Filled 2013-08-25: qty 1

## 2013-08-25 MED ORDER — CLONIDINE HCL 0.2 MG PO TABS
0.2000 mg | ORAL_TABLET | Freq: Two times a day (BID) | ORAL | Status: DC
  Administered 2013-08-25 – 2013-08-26 (×2): 0.2 mg via ORAL
  Filled 2013-08-25 (×5): qty 1

## 2013-08-25 MED ORDER — SODIUM CHLORIDE 0.9 % IV SOLN
100.0000 mL | INTRAVENOUS | Status: DC | PRN
Start: 2013-08-25 — End: 2013-08-27

## 2013-08-25 MED ORDER — HEPARIN SODIUM (PORCINE) 1000 UNIT/ML DIALYSIS: 1000.0000 [IU] | INTRAMUSCULAR | Status: DC | PRN

## 2013-08-25 MED ORDER — HEPARIN SODIUM (PORCINE) 1000 UNIT/ML DIALYSIS
1000.0000 [IU] | INTRAMUSCULAR | Status: DC | PRN
  Filled 2013-08-25: qty 1

## 2013-08-25 MED ORDER — NITROGLYCERIN 0.4 MG SL SUBL
0.4000 mg | SUBLINGUAL_TABLET | SUBLINGUAL | Status: DC | PRN
  Administered 2013-08-25: 0.4 mg via SUBLINGUAL
  Filled 2013-08-25: qty 1

## 2013-08-25 MED ORDER — LORATADINE 10 MG PO TABS
10.0000 mg | ORAL_TABLET | Freq: Every day | ORAL | Status: DC
  Administered 2013-08-26 – 2013-08-27 (×2): 10 mg via ORAL
  Filled 2013-08-25 (×2): qty 1

## 2013-08-25 MED ORDER — NITROGLYCERIN IN D5W 200-5 MCG/ML-% IV SOLN: 2.0000 ug/min | INTRAVENOUS | Status: DC

## 2013-08-25 MED ORDER — SUCCINYLCHOLINE CHLORIDE 20 MG/ML IJ SOLN
INTRAMUSCULAR | Status: AC
  Filled 2013-08-25: qty 1

## 2013-08-25 MED ORDER — FOLIC ACID 1 MG PO TABS
1.0000 mg | ORAL_TABLET | Freq: Every day | ORAL | Status: DC
  Administered 2013-08-26 – 2013-08-27 (×2): 1 mg via ORAL
  Filled 2013-08-25 (×2): qty 1

## 2013-08-25 MED ORDER — LIDOCAINE HCL (CARDIAC) 20 MG/ML IV SOLN
INTRAVENOUS | Status: AC
  Filled 2013-08-25: qty 5

## 2013-08-25 MED ORDER — VITAMIN B-1 100 MG PO TABS
100.0000 mg | ORAL_TABLET | Freq: Every day | ORAL | Status: DC
  Administered 2013-08-26 – 2013-08-27 (×2): 100 mg via ORAL
  Filled 2013-08-25 (×2): qty 1

## 2013-08-25 MED ORDER — PENTAFLUOROPROP-TETRAFLUOROETH EX AERO: 1.0000 "application " | INHALATION_SPRAY | CUTANEOUS | Status: DC | PRN

## 2013-08-25 MED ORDER — ETOMIDATE 2 MG/ML IV SOLN
INTRAVENOUS | Status: AC
  Filled 2013-08-25: qty 20

## 2013-08-25 MED ORDER — ALTEPLASE 2 MG IJ SOLR: 2.0000 mg | Freq: Once | INTRAMUSCULAR | Status: DC | PRN

## 2013-08-25 MED ORDER — ALTEPLASE 2 MG IJ SOLR: 2.0000 mg | Freq: Once | INTRAMUSCULAR | Status: AC | PRN

## 2013-08-25 MED ORDER — LORAZEPAM 2 MG/ML IJ SOLN: 1.0000 mg | Freq: Four times a day (QID) | INTRAMUSCULAR | Status: DC | PRN

## 2013-08-25 MED ORDER — ACETAMINOPHEN 325 MG PO TABS
650.0000 mg | ORAL_TABLET | Freq: Once | ORAL | Status: AC
  Administered 2013-08-25: 650 mg via ORAL
  Filled 2013-08-25: qty 2

## 2013-08-25 MED ORDER — NITROGLYCERIN IN D5W 200-5 MCG/ML-% IV SOLN
30.0000 ug/min | INTRAVENOUS | Status: DC
  Administered 2013-08-25: 40 ug/min via INTRAVENOUS
  Filled 2013-08-25: qty 250

## 2013-08-25 MED ORDER — ADULT MULTIVITAMIN W/MINERALS CH
1.0000 | ORAL_TABLET | Freq: Every day | ORAL | Status: DC
  Administered 2013-08-26 – 2013-08-27 (×2): 1 via ORAL
  Filled 2013-08-25 (×2): qty 1

## 2013-08-25 MED ORDER — OXYCODONE-ACETAMINOPHEN 5-325 MG PO TABS
1.0000 | ORAL_TABLET | ORAL | Status: DC | PRN
Start: 2013-08-25 — End: 2013-08-27

## 2013-08-25 MED ORDER — ALLOPURINOL 100 MG PO TABS
100.0000 mg | ORAL_TABLET | Freq: Every day | ORAL | Status: DC
  Administered 2013-08-26 – 2013-08-27 (×2): 100 mg via ORAL
  Filled 2013-08-25 (×2): qty 1

## 2013-08-25 MED ORDER — ROCURONIUM BROMIDE 50 MG/5ML IV SOLN
INTRAVENOUS | Status: AC
  Filled 2013-08-25: qty 2

## 2013-08-25 MED ORDER — HEPARIN SODIUM (PORCINE) 5000 UNIT/ML IJ SOLN
5000.0000 [IU] | Freq: Three times a day (TID) | INTRAMUSCULAR | Status: DC
  Administered 2013-08-25: 5000 [IU] via SUBCUTANEOUS

## 2013-08-25 NOTE — ED Notes (Signed)
IV nurse team down to deaccess lt. Upper arm arteriovenous fistula.

## 2013-08-25 NOTE — ED Provider Notes (Signed)
CSN: [id number]     Arrival date & time 08/25/13  1459 History   First MD Initiated Contact with Patient 08/25/13 1517     Chief Complaint  Patient presents with  . Shortness of Breath     (Consider location/radiation/quality/duration/timing/severity/associated sxs/prior Treatment) HPI 63 year old female presents with EMS for severe shortness of breath that occurred while on dialysis. She had been on dialysis for approximately 2 hours prior to becoming short of breath. She at that time was severely agitated and was uncooperative. Dialysis was immediately stopped and she was brought over to the emergency room. Level V caveat: History limited by the patient's altered mental status.    Past Medical History  Diagnosis Date  . Obesity   . Uterine polyp   . Cervical dysplasia   . Hypertension   . Chronic kidney disease   . Constipation   . Gout   . Constipation   . Abnormal Pap smear     ASCUS ?HGSIL   Past Surgical History  Procedure Laterality Date  . Hysteroscopy    . Leep    . Dilation and curettage of uterus    . Av fistula placement  01/18/2012    Procedure: ARTERIOVENOUS (AV) FISTULA CREATION;  Surgeon: Angelia Mould, MD;  Location: [facility] OR;  Service: Vascular;  Laterality: Left;   Family History  Problem Relation Age of Onset  . Colon cancer Neg Hx   . Esophageal cancer    . Pancreatic cancer    . Heart disease Mother   . Hypertension Mother   . Breast cancer Sister   . Esophageal cancer Sister   . Cancer Sister   . Deep vein thrombosis Sister   . Diabetes Sister   . Hypertension Sister   . Hypertension Daughter    History  Substance Use Topics  . Smoking status: Current Every Day Smoker -- 1.00 packs/day for 30 years    Types: Cigarettes  . Smokeless tobacco: Never Used     Comment: pt states that she is still trying  . Alcohol Use: 3.6 oz/week    6 Cans of beer per week   OB History   Grav Para Term Preterm Abortions TAB SAB Ect Mult Living   2 1    1 1    1      Review of Systems  Unable to perform ROS: Mental status change      Allergies  Review of patient's allergies indicates no known allergies.  Home Medications   Prior to Admission medications   Medication Sig Start Date End Date Taking? Authorizing Provider  allopurinol (ZYLOPRIM) 100 MG tablet Take 100 mg by mouth daily.      Historical Provider, MD  atenolol (TENORMIN) 100 MG tablet Take 100 mg by mouth daily.     Historical Provider, MD  calcitRIOL (ROCALTROL) 0.25 MCG capsule Take 1 capsule by mouth daily. 09/10/12   Historical Provider, MD  calcium acetate (PHOSLO) 667 MG capsule Take by mouth 3 (three) times daily with meals.    Historical Provider, MD  cloNIDine (CATAPRES) 0.2 MG tablet Take 0.2 mg by mouth 2 (two) times daily.      Historical Provider, MD  furosemide (LASIX) 40 MG tablet Take 40 mg by mouth 2 (two) times daily.      Historical Provider, MD  HYDROcodone-acetaminophen (VICODIN) 5-500 MG per tablet Take 1 tablet by mouth every 6 (six) hours as needed. For pain    Historical Provider, MD  loratadine (CLARITIN) 10 MG  tablet Take 10 mg by mouth daily. For allergies    Historical Provider, MD  oxyCODONE-acetaminophen (ROXICET) 5-325 MG per tablet Take 1-2 tablets by mouth every 4 (four) hours as needed for pain. 01/18/12   Angelia Mould, MD  sodium bicarbonate 650 MG tablet Take 650 mg by mouth 3 (three) times daily.    Historical Provider, MD   BP 146/77  Pulse 90  Temp(Src) 98.4 F (36.9 C) (Oral)  Resp 14  Ht 5\' 11"  (1.803 m)  Wt 165 lb 12.6 oz (75.2 kg)  BMI 23.13 kg/m2  SpO2 96% Physical Exam  Constitutional: She is oriented to person, place, and time. She appears well-developed and well-nourished. She is uncooperative. She appears toxic. She appears distressed.  HENT:  Head: Normocephalic and atraumatic.  Eyes: Pupils are equal, round, and reactive to light. Right eye exhibits no discharge. Left eye exhibits no discharge.  Neck: Normal  range of motion.  Cardiovascular: Normal rate, regular rhythm and normal heart sounds.   Pulmonary/Chest: She is in respiratory distress. She has decreased breath sounds.  Abdominal: Soft. She exhibits no distension. There is no tenderness.  Musculoskeletal: Normal range of motion.  Neurological: She is oriented to person, place, and time.  Skin: Skin is warm. She is not diaphoretic.    ED Course  Procedural sedation Date/Time: 08/26/2013 1:20 AM Performed by: Freddi Che Authorized by: Freddi Che Consent: The procedure was performed in an emergent situation. Local anesthesia used: no Patient sedated: yes Sedation type: anxiolysis and moderate (conscious) sedation Sedatives: ketamine and see MAR for details Vitals: Vital signs were monitored during sedation. Patient tolerance: Patient tolerated the procedure well with no immediate complications.   (including critical care time) Labs Review Labs Reviewed  BASIC METABOLIC PANEL - Abnormal; Notable for the following:    Potassium 2.4 (*)    Chloride 93 (*)    Glucose, Bld 224 (*)    Creatinine, Ser 2.59 (*)    GFR calc non Af Amer 18 (*)    GFR calc Af Amer 21 (*)    Anion gap 18 (*)    All other components within normal limits  CBC WITH DIFFERENTIAL - Abnormal; Notable for the following:    RBC 3.25 (*)    Hemoglobin 10.5 (*)    HCT 34.1 (*)    MCV 104.9 (*)    RDW 15.6 (*)    All other components within normal limits  PRO B NATRIURETIC PEPTIDE - Abnormal; Notable for the following:    Pro B Natriuretic peptide (BNP) >70000.0 (*)    All other components within normal limits  TROPONIN I - Abnormal; Notable for the following:    Troponin I 1.09 (*)    All other components within normal limits  I-STAT CG4 LACTIC ACID, ED - Abnormal; Notable for the following:    Lactic Acid, Venous 6.15 (*)    All other components within normal limits  CBG MONITORING, ED - Abnormal; Notable for the following:    Glucose-Capillary 176  (*)    All other components within normal limits  I-STAT ARTERIAL BLOOD GAS, ED - Abnormal; Notable for the following:    pCO2 arterial 54.6 (*)    pO2, Arterial 436.0 (*)    Bicarbonate 34.3 (*)    Acid-Base Excess 8.0 (*)    All other components within normal limits  MRSA PCR SCREENING  URINE CULTURE  AMMONIA  ETHANOL  TROPONIN I  HEPATITIS B SURFACE ANTIGEN  HEPATITIS B CORE ANTIBODY,  TOTAL  HEPATITIS B SURFACE ANTIBODY  URINALYSIS, ROUTINE W REFLEX MICROSCOPIC  CBC  COMPREHENSIVE METABOLIC PANEL  TROPONIN I  Randolm Idol, ED    Imaging Review Dg Chest Port 1 View  08/25/2013   CLINICAL DATA:  Severe shortness of breath.  EXAM: PORTABLE CHEST - 1 VIEW  COMPARISON:  03/11/2012  FINDINGS: The patient has bilateral pulmonary edema. There is cardiomegaly. Pulmonary vascularity is within normal limits. No discrete effusions.  No acute osseous abnormality.  IMPRESSION: Bilateral hazy pulmonary infiltrates consistent with pulmonary edema.   Electronically Signed   By: Rozetta Nunnery M.D.   On: 08/25/2013 15:52     EKG Interpretation   Date/Time:  Monday August 25 2013 15:08:48 EDT Ventricular Rate:  113 PR Interval:  161 QRS Duration: 108 QT Interval:  330 QTC Calculation: 452 R Axis:   23 Text Interpretation:  Age not entered, assumed to be  63 years old for  purpose of ECG interpretation Sinus tachycardia Probable left ventricular  hypertrophy Repol abnrm, severe global ischemia (LM/MVD) Baseline wander  in lead(s) II V2 V3 Confirmed by ZAVITZ  MD, JOSHUA ([id number]) on 08/25/2013  3:18:50 PM      MDM   Final diagnoses:  Alcohol abuse  Acute respiratory failure with hypoxia  End stage renal disease  Flash pulmonary edema   63 year old female with a history of chronic kidney disease requiring dialysis presents today in acute respiratory distress during her dialysis treatment.  Upon arrival here the patient is hemodynamically stable. The patient is in acute distress and  has had a oxygen saturations in the 70%. The patient was not cooperating with having a nonrebreather nor BiPAP mask placed. Given the patient's continued hypoxia and her agitation and altered mental status, there is concern for need to further manage the patient's airway. Plan is to attempt to calm the patient by using associated dosing of ketamine and then using BiPAP.  Patient given 1 mg/kg of ketamine. During that time the patient was able to hold down was able to tolerate BiPAP. As the ketamine began to wear off, the patient was more cooperative with the BiPAP and seemed to improve and seemed to have less respiratory distress. During that time a sublingual nitroglycerin was given and the patient was also started on a nitro drip.  EKG was reviewed and demonstrates ST depressions in the inferior leads. No ST elevations appreciated. Basic labs, BMP, troponin obtained. Chest x-ray demonstrates bilateral hazy pulmonary infiltrates consistent with pulmonary edema. Troponin initially undetectable. CBC was unremarkable. Patient had a lactic acidosis on arrival.   After being on BiPAP, the patient appears more comfortable. Patient able to be taken off of BiPAP and placed on nasal cannula with appropriate O2 sats. Plan is to admit the patient with the hospitalist to the floor with a consult to cardiology given the patient's ST depressions. Cardiology agrees to follow the patient. Patient admitted with the hospitalist to the floor in stable condition. The patient was seen and evaluated by myself and by the attending Dr. Reather Converse.    Freddi Che, MD 08/26/13 0120

## 2013-08-25 NOTE — ED Notes (Signed)
Lt. Upper arm Arteriovenous fistula began to bleed, pressure applied for 10 minutes.  IV nurse paged.  They checked the fistula, bleeding stopped dressing changed.

## 2013-08-25 NOTE — ED Notes (Signed)
Pt. Has a headache. Medicated per orders

## 2013-08-25 NOTE — Progress Notes (Signed)
Echo Lab  2D Echocardiogram completed.  [place], RDCS 08/25/2013 4:44 PM

## 2013-08-25 NOTE — Progress Notes (Signed)
ECHOCARDIOGRAM 08/25/13:  Study Conclusions  - Left ventricle: Systolic function was mildly to moderately reduced. The estimated ejection fraction was in the range of 40% to 45%. Hypokinesis of the mid-apicalanteroseptal, anterior, and apical myocardium. Due to tachycardia, there was fusion of early and atrial contributions to ventricular filling. - Mitral valve: There was mild regurgitation. - Pulmonary arteries: Systolic pressure was mildly increased. PA peak pressure: 45 mm Hg (S).   ASSESSMENT: Given the new wall motion abnormality on echo, the patient will need to have coronary angiography tomorrow assuming her respiratory status and heart failure is improved.  Wall motion pattern could simply represent stress cardiomyopathy but cannot exclude LAD disease.  Recommendation: Will make the patient n.p.o. after midnight for possible cath.  If the patient is still having respiratory issues tomorrow cath will need to be to deferred for another day or so.   Nephrology consult to consider further dialysis today to remove volume and further resolve heart failure.

## 2013-08-25 NOTE — ED Notes (Signed)
Family at bedside. Family updated on pt.s progress and plan of care. Pt. Aware and agreed to updating pt.s daughter

## 2013-08-25 NOTE — Consult Note (Signed)
Renal Service Consult Note [facility]  Jessica Clarke 08/25/2013 [place] D Requesting Physician:  Dr Reather Converse , ED at [facility]  Reason for Consult:  ESRD pt with resp distress HPI: The patient is a 63 y.o. year-old with hx of HTN, gout, and CKD started dialysis about 6 weeks ago at [facility].  She was about 2.5 hrs into HD today at the outpatient center when she developed acute SOB and resp distress.  She was brought to ED by EMS. She was agitated on arrival, unable to speak full sentences.  She was sedated with IV ketamine then placed on bipap and now she is doing much better.  She also is on IV NTG drip for high BP and CXR showed bilat pulm infiltrates c/w edema.  EKG w IL ST depression and trop I is negative.   Denies any CP today.  No fevers, n/v/d, no abd pain, no jt pain or rash, no HA or confusion. She does describe a very poor appetite, taking medication to "get my weight up".    Past Medical History  Past Medical History  Diagnosis Date  . Obesity   . Uterine polyp   . Cervical dysplasia   . Hypertension   . Chronic kidney disease   . Constipation   . Gout   . Constipation   . Abnormal Pap smear     ASCUS ?HGSIL   Past Surgical History  Past Surgical History  Procedure Laterality Date  . Hysteroscopy    . Leep    . Dilation and curettage of uterus    . Av fistula placement  01/18/2012    Procedure: ARTERIOVENOUS (AV) FISTULA CREATION;  Surgeon: Angelia Mould, MD;  Location: [facility] OR;  Service: Vascular;  Laterality: Left;   Family History  Family History  Problem Relation Age of Onset  . Colon cancer Neg Hx   . Esophageal cancer    . Pancreatic cancer    . Heart disease Mother   . Hypertension Mother   . Breast cancer Sister   . Esophageal cancer Sister   . Cancer Sister   . Deep vein thrombosis Sister   . Diabetes Sister   . Hypertension Sister   . Hypertension Daughter    Social History  reports that she has been smoking Cigarettes.  She  has a 30 pack-year smoking history. She has never used smokeless tobacco. She reports that she drinks about 3.6 ounces of alcohol per week. She reports that she does not use illicit drugs. Allergies No Known Allergies Home medications Prior to Admission medications   Medication Sig Start Date End Date Taking? Authorizing Provider  allopurinol (ZYLOPRIM) 100 MG tablet Take 100 mg by mouth daily.      Historical Provider, MD  atenolol (TENORMIN) 100 MG tablet Take 100 mg by mouth daily.     Historical Provider, MD  calcitRIOL (ROCALTROL) 0.25 MCG capsule Take 1 capsule by mouth daily. 09/10/12   Historical Provider, MD  calcium acetate (PHOSLO) 667 MG capsule Take by mouth 3 (three) times daily with meals.    Historical Provider, MD  cloNIDine (CATAPRES) 0.2 MG tablet Take 0.2 mg by mouth 2 (two) times daily.      Historical Provider, MD  furosemide (LASIX) 40 MG tablet Take 40 mg by mouth 2 (two) times daily.      Historical Provider, MD  HYDROcodone-acetaminophen (VICODIN) 5-500 MG per tablet Take 1 tablet by mouth every 6 (six) hours as needed. For pain  Historical Provider, MD  loratadine (CLARITIN) 10 MG tablet Take 10 mg by mouth daily. For allergies    Historical Provider, MD  oxyCODONE-acetaminophen (ROXICET) 5-325 MG per tablet Take 1-2 tablets by mouth every 4 (four) hours as needed for pain. 01/18/12   Angelia Mould, MD  sodium bicarbonate 650 MG tablet Take 650 mg by mouth 3 (three) times daily.    Historical Provider, MD   Liver Function Tests No results found for this basename: AST, ALT, ALKPHOS, BILITOT, PROT, ALBUMIN,  in the last 168 hours No results found for this basename: LIPASE, AMYLASE,  in the last 168 hours CBC  Recent Labs Lab 08/25/13 1520  WBC 10.2  NEUTROABS 5.9  HGB 10.5*  HCT 34.1*  MCV 104.9*  PLT 299   Basic Metabolic Panel  Recent Labs Lab 08/25/13 1520  NA 139  K 2.4*  CL 93*  CO2 28  GLUCOSE 224*  BUN 6  CREATININE 2.59*  CALCIUM 8.8     Filed Vitals:   08/25/13 1700 08/25/13 1715 08/25/13 1730 08/25/13 1745  BP: 169/101 177/118 155/87 152/81  Pulse: 93 94 91 91  Resp: 19 27 28 31   Weight:      SpO2: 99% 98% 92% 92%   Exam: Older adult female, NAD , on 2L [place] O2 No rash, cyanosis or gangrene Sclera anicteric, throat clear No jvd Chest faint bibasilar rales, no wheezing RRR no MRG Abd soft, NTND, no ascites or HSM LE mild bilat pitting ankle and pedal edema Neuro is nf, Ox 3 LUA AVF patent  ECHO 08/25/13    EF 40-45% EKG mild inf/lateral ST depression  HD: [facility] 4h  250/800   78kg   2/2.0 [place]   AVF LUA  Heparin 4000 Aranesp 60 ug and Venofer 50 mg q Wed, Hectorol 2 ug TIW Lasb: tsat 29%, ferr 493, Hb 10.1 -- phos 4.4, pth 211    Assessment: 1 Acute resp failure / acute pulm edema- better after period on bipap in ED.  BP was very high, pulm edema on CXR suspect volume overload w history of poor appetite and prob lean body wt loss. Other possibility is ischemia, but trop is negative in ED. 2 ESRD on HD - recent start 6 wks ago. BFR still at 250, increase to 400.  3 HTN uncontrolled - on BB and clonidine at home 4 Hx gout 5 Anemia on aranesp 6 HPTH cont binders, vit D   Plan- acute HD upstairs tonight, UF 3-4 kg as tolerated. HD again tomorrow if needed for further vol removal and lower dry wt.   Kelly Splinter MD (pgr) [phone]    ([phone] 08/25/2013, 6:07 PM

## 2013-08-25 NOTE — Procedures (Signed)
I was present at this dialysis session, have reviewed the session itself and made  appropriate changes  Kelly Splinter MD (pgr) [phone]    [phone] 08/25/2013, 10:09 PM

## 2013-08-25 NOTE — Consult Note (Signed)
Reason for Consult:  CHF, EKG changes Referring Physician: ER  Jessica Clarke is an 64 y.o. female.  HPI:   The patient is a 63 yo female with a history of HTN, CKD-ESRD, tobacco abuse, obesity, gout.  No prior cardiac history that I could see from chart review.  Her last echo was from 2006 with normal EF, moderate diastolic dysfunction, normal valve function.  She presented from HD hypoxic with frothy sputum after having about 2 hours of treatment.  She is currently on bi-pap with Ketamine.   I was unable to complete a ROS.  EKG reveals inferior/lat ST depression, LVH.  Past Medical History  Diagnosis Date  . Obesity   . Uterine polyp   . Cervical dysplasia   . Hypertension   . Chronic kidney disease   . Constipation   . Gout   . Constipation   . Abnormal Pap smear     ASCUS ?HGSIL    Past Surgical History  Procedure Laterality Date  . Hysteroscopy    . Leep    . Dilation and curettage of uterus    . Av fistula placement  01/18/2012    Procedure: ARTERIOVENOUS (AV) FISTULA CREATION;  Surgeon: Angelia Mould, MD;  Location: [facility] OR;  Service: Vascular;  Laterality: Left;    Family History  Problem Relation Age of Onset  . Colon cancer Neg Hx   . Esophageal cancer    . Pancreatic cancer    . Heart disease Mother   . Hypertension Mother   . Breast cancer Sister   . Esophageal cancer Sister   . Cancer Sister   . Deep vein thrombosis Sister   . Diabetes Sister   . Hypertension Sister   . Hypertension Daughter     Social History:  reports that she has been smoking Cigarettes.  She has a 30 pack-year smoking history. She has never used smokeless tobacco. She reports that she drinks about 3.6 ounces of alcohol per week. She reports that she does not use illicit drugs.  Allergies: No Known Allergies  Medications:  Prior to Admission medications   Medication Sig Start Date End Date Taking? Authorizing Provider  allopurinol (ZYLOPRIM) 100 MG tablet Take 100 mg  by mouth daily.      Historical Provider, MD  atenolol (TENORMIN) 100 MG tablet Take 100 mg by mouth daily.     Historical Provider, MD  calcitRIOL (ROCALTROL) 0.25 MCG capsule Take 1 capsule by mouth daily. 09/10/12   Historical Provider, MD  calcium acetate (PHOSLO) 667 MG capsule Take by mouth 3 (three) times daily with meals.    Historical Provider, MD  cloNIDine (CATAPRES) 0.2 MG tablet Take 0.2 mg by mouth 2 (two) times daily.      Historical Provider, MD  furosemide (LASIX) 40 MG tablet Take 40 mg by mouth 2 (two) times daily.      Historical Provider, MD  HYDROcodone-acetaminophen (VICODIN) 5-500 MG per tablet Take 1 tablet by mouth every 6 (six) hours as needed. For pain    Historical Provider, MD  loratadine (CLARITIN) 10 MG tablet Take 10 mg by mouth daily. For allergies    Historical Provider, MD  oxyCODONE-acetaminophen (ROXICET) 5-325 MG per tablet Take 1-2 tablets by mouth every 4 (four) hours as needed for pain. 01/18/12   Angelia Mould, MD  sodium bicarbonate 650 MG tablet Take 650 mg by mouth 3 (three) times daily.    Historical Provider, MD     Results  for orders placed during the hospital encounter of 08/25/13 (from the past 48 hour(s))  CBG MONITORING, ED     Status: Abnormal   Collection Time    08/25/13  3:04 PM      Result Value Ref Range   Glucose-Capillary 176 (*) 70 - 99 mg/dL   Comment 1 Notify RN      No results found.  Review of Systems  Unable to perform ROS: acuity of condition   Blood pressure 107/79, pulse 70, resp. rate 27, weight 177 lb 0.5 oz (80.3 kg), SpO2 90.00%. Physical Exam  Nursing note and vitals reviewed. Constitutional: She appears well-developed and well-nourished. She appears distressed.  HENT:  Head: Normocephalic and atraumatic.  Eyes: Pupils are equal, round, and reactive to light.  Neck: JVD present.  Cardiovascular: Normal rate, regular rhythm, S1 normal and S2 normal.   No murmur heard. Pulses:      Radial pulses are  1+ on the right side, and 1+ on the left side.       Dorsalis pedis pulses are 1+ on the right side, and 1+ on the left side.  Respiratory: She has rales.  On Bi-Pap  GI: Soft. Bowel sounds are normal. She exhibits no distension. There is no tenderness.  Musculoskeletal: She exhibits no edema.  Lymphadenopathy:    She has no cervical adenopathy.  Neurological: She is alert.  Skin: Skin is warm and dry.  Psychiatric:  Given Ketamine and on bi-pap.    Assessment/Plan: Acute respiratory failure Hypoxia Abnormal EKG Acute Pulmonary edema CKD ESRD HTN Tobacco abuse  Plan:   The patient is a 63 yo female with a history of HTN, CKD-ESRD on HD tobacco abuse, obesity, gout.  No prior cardiac history.  She presents with acute resp failure and pulmonary edema.  She is on bi-pap.  IV NTG started and will continue.   I ordered a stat echo to assess LV and RV function. EKG is tachycardic and indicates inferior/lateral ischemia/LVH.  Labs pending.  Will cycle cardiac enzymes.  Need to reemphasize tobacco cessation.  Continue BB, clonidine and IV lasix.  She takes 40mg  lasix BID at home.    Tarri Fuller, PA-C 08/25/2013, 3:37 PM

## 2013-08-25 NOTE — Consult Note (Signed)
I evaluated this patient with Mr. Tenny Craw, PA-C. All aspects of care were discussed. 63 year old with end-stage renal disease who suddenly developed dyspnea while on dialysis. She was brought to the emergency room where BiPAP was placed. Severe blood pressure elevations were noted. She is now starting to feel better. Prior echocardiogram 9 years ago revealed LVH and diastolic dysfunction. She denies any chest discomfort associated with today's episode She has a history of hypertension, heavy cigarette smoking, and daily alcohol use, greater than 32 ounces of beer daily. A summation gallop is heard on auscultation. ECG does not demonstrate any acute ST elevation. There is junctional ST depression in V5 and the inferior leads.  The patient is not having an acute ST elevation myocardial infarction. She is in acute pulmonary edema. Etiology is uncertain. This could simply be volume overload and hypertension related to her underlying chronic kidney disease and alcohol abuse. Ischemic heart disease will need to be excluded, and the extent of the evaluation will depend on her clinical course and whether or not the cardiac markers are elevated. If the markers become elevated she will need coronary angiography. If LV function on echo is normal and there are negative markers, a myocardial perfusion study may suffice.  IV nitroglycerin for now to help with blood pressure control. Consult nephrology to see if further dialysis is needed. Cycle cardiac markers to exclude infarction.

## 2013-08-25 NOTE — ED Notes (Signed)
To ED via [facility] for eval of severe SOB. No CP. Pt not cooperative on arrival. Skin moist. Unable to speak full words

## 2013-08-25 NOTE — H&P (Signed)
History and Physical  Jessica Clarke ZDG:[id number] DOB: November 02, 1950 DOA: 08/25/2013   PCP: Irena Cords, MD   Chief Complaint: Shortness of breath  HPI:  63 year old female with a history of ESRD dialyzing on Monday, Wednesday, Fridays at [facility] presented from dialysis to the emergency department today because of acute onset of respiratory distress. After 2 hours of dialysis, the patient began having shortness of breath and increasing agitation. She was hypoxic without desaturation to the 70s. She was agitated and placed on BiPAP after being brought to the emergency department. The patient was given ketamine in preparation for intubation as well as for her agitation. Fortunately, the patient gradually improved with less agitation and was subsequently weaned off of BiPAP. Initial EKG showed ST depression V4 to V6 as well as  II,III.  chest x-ray showed pulmonary edema.  As a result, cardiology was consulted.  They did not feel that the patient needed urgently go to heart catheterization. Initial troponin was negative. However, bedside echocardiogram showed EF 40-45% with new hypokinesis / wall motion abnormality when compared to prior echos. The patient was started on a nitroglycerin drip as per initial blood pressure was 200/110s. With the initiation of nitroglycerin drip on BiPAP, the patient's respiratory distress improved. She has been weaned to nasal cannula with oxygen saturation 96-97% on 3 L.  Cardiology presently plans for cardiac catheterization on the morning of 08/26/2013 if the patient is stable. Nephrology was consulted. The patient is presently been take dialysis for fluid removal.   the patient has not had any chest discomfort. She states that when she woke up this morning she was feeling fine. She endorses compliance with dialysis and has been staying for dialysis sessions for the full duration. She also endorses compliance with all her medications.  Furthermore, the patient says that she has not been drinking excessive  fluid.   In the emergency department, potassium was 2.4, calcium 8.8. WBC was 10.2. Hemoglobin was 10.5. ProBNP was >70K.  chest x-ray showed pulmonary edema. EKG was discussed above. Initial troponin was negative. Ammonia was 29.    Assessment/Plan:  acute respiratory failure  -Secondary to flash pulmonary edema  -Patient is presently stable on 3 L nasal cannula  -Cardiology has requested a stepdown admission -nephrology has been consulted--spoke with Dr. Adria Dill Pulmonary Edema -Unclear if this is due to hypertensive emergency versus primary cardiac ischemia versus noncompliance  -The patient is going to dialysis urgently  -Cycle troponins  -Continue nitroglycerin  HTN Emergency -Patient endorses compliance to her medications but I question her sugar compliance  -Continue nitroglycerin drip--titrate to SBP 160 -I expect her blood pressure to improve with dialysis and fluid removal  -Continue home antihypertensive medications  alcohol abuse  -The patient states that she drinks 32 ounces of beer daily  -Alcohol withdrawal protocol  ESRD  -Nephrology is following  -Metabolic bone disease management per nephrology        Past Medical History  Diagnosis Date  . Obesity   . Uterine polyp   . Cervical dysplasia   . Hypertension   . Chronic kidney disease   . Constipation   . Gout   . Constipation   . Abnormal Pap smear     ASCUS ?HGSIL   Past Surgical History  Procedure Laterality Date  . Hysteroscopy    . Leep    . Dilation and curettage of uterus    . Av fistula placement  01/18/2012  Procedure: ARTERIOVENOUS (AV) FISTULA CREATION;  Surgeon: Chuck Hint, MD;  Location: [facility] OR;  Service: Vascular;  Laterality: Left;   Social History:  reports that she has been smoking Cigarettes.  She has a 30 pack-year smoking history. She has never used smokeless tobacco. She reports that she  drinks about 3.6 ounces of alcohol per week. She reports that she does not use illicit drugs.   Family History  Problem Relation Age of Onset  . Colon cancer Neg Hx   . Esophageal cancer    . Pancreatic cancer    . Heart disease Mother   . Hypertension Mother   . Breast cancer Sister   . Esophageal cancer Sister   . Cancer Sister   . Deep vein thrombosis Sister   . Diabetes Sister   . Hypertension Sister   . Hypertension Daughter      No Known Allergies    Prior to Admission medications   Medication Sig Start Date End Date Taking? Authorizing Provider  allopurinol (ZYLOPRIM) 100 MG tablet Take 100 mg by mouth daily.      Historical Provider, MD  atenolol (TENORMIN) 100 MG tablet Take 100 mg by mouth daily.     Historical Provider, MD  calcitRIOL (ROCALTROL) 0.25 MCG capsule Take 1 capsule by mouth daily. 09/10/12   Historical Provider, MD  calcium acetate (PHOSLO) 667 MG capsule Take by mouth 3 (three) times daily with meals.    Historical Provider, MD  cloNIDine (CATAPRES) 0.2 MG tablet Take 0.2 mg by mouth 2 (two) times daily.      Historical Provider, MD  furosemide (LASIX) 40 MG tablet Take 40 mg by mouth 2 (two) times daily.      Historical Provider, MD  HYDROcodone-acetaminophen (VICODIN) 5-500 MG per tablet Take 1 tablet by mouth every 6 (six) hours as needed. For pain    Historical Provider, MD  loratadine (CLARITIN) 10 MG tablet Take 10 mg by mouth daily. For allergies    Historical Provider, MD  oxyCODONE-acetaminophen (ROXICET) 5-325 MG per tablet Take 1-2 tablets by mouth every 4 (four) hours as needed for pain. 01/18/12   Chuck Hint, MD  sodium bicarbonate 650 MG tablet Take 650 mg by mouth 3 (three) times daily.    Historical Provider, MD    Review of Systems:  Constitutional:  No weight loss, night sweats, Fevers, chills, fatigue.  Head&Eyes: No headache.  No vision loss.  No eye pain or scotoma ENT:  No Difficulty swallowing,Tooth/dental  problems,Sore throat,  No ear ache, post nasal drip,  Cardio-vascular:  No chest pain, Orthopnea, PND, swelling in lower extremities palpitations  GI:  No  abdominal pain, nausea, vomiting, diarrhea, loss of appetite, hematochezia, melena, heartburn, indigestion, Resp:  No cough. No coughing up of blood .No wheezing.No chest wall deformity  Skin:  no rash or lesions.  GU:  no dysuria, change in color of urine, no urgency or frequency. No flank pain.  Musculoskeletal:  No joint pain or swelling. No decreased range of motion. No back pain.  Psych:  No change in mood or affect. No depression or anxiety. Neurologic: No headache, no dysesthesia, no focal weakness, no vision loss. No syncope  Physical Exam: Filed Vitals:   08/25/13 1715 08/25/13 1730 08/25/13 1745 08/25/13 1800  BP: 177/118 155/87 152/81 170/92  Pulse: 94 91 91 92  Resp: 27 28 31 21   Weight:      SpO2: 98% 92% 92% 96%   General:  Awake and alert,  NAD, nontoxic, pleasant/cooperative Head/Eye: No conjunctival hemorrhage, no icterus, [place]/AT, No nystagmus ENT:  No icterus,  No thrush,  no pharyngeal exudate Neck:  No masses, no lymphadenpathy, no meningismus CV:  RRR, no rub,+ S3 Lung:  Bilateral crackles. No wheezing. Good air movement.  Abdomen: soft/NT, +BS, nondistended, no peritoneal signs Ext: No cyanosis, No rashes, No petechiae, No lymphangitis, No edema Neuro: CNII-XII intact, strength 4/5 in bilateral upper and lower extremities, no dysmetria  Labs on Admission:  Basic Metabolic Panel:  Recent Labs Lab 08/25/13 1520  NA 139  K 2.4*  CL 93*  CO2 28  GLUCOSE 224*  BUN 6  CREATININE 2.59*  CALCIUM 8.8   Liver Function Tests: No results found for this basename: AST, ALT, ALKPHOS, BILITOT, PROT, ALBUMIN,  in the last 168 hours No results found for this basename: LIPASE, AMYLASE,  in the last 168 hours  Recent Labs Lab 08/25/13 1520  AMMONIA 29   CBC:  Recent Labs Lab 08/25/13 1520  WBC  10.2  NEUTROABS 5.9  HGB 10.5*  HCT 34.1*  MCV 104.9*  PLT 231   Cardiac Enzymes:  Recent Labs Lab 08/25/13 1520  TROPONINI <0.30   BNP: No components found with this basename: POCBNP,  CBG:  Recent Labs Lab 08/25/13 1504  GLUCAP 176*    Radiological Exams on Admission: Dg Chest Port 1 View  08/25/2013   CLINICAL DATA:  Severe shortness of breath.  EXAM: PORTABLE CHEST - 1 VIEW  COMPARISON:  03/11/2012  FINDINGS: The patient has bilateral pulmonary edema. There is cardiomegaly. Pulmonary vascularity is within normal limits. No discrete effusions.  No acute osseous abnormality.  IMPRESSION: Bilateral hazy pulmonary infiltrates consistent with pulmonary edema.   Electronically Signed   By: Geanie Cooley M.D.   On: 08/25/2013 15:52    EKG: Independently reviewed. Sinus rhythm with ST depression V4 to V6, II, III    Time spent:60 minutes Code Status:   FULL Family Communication:   No Family at bedside   Geneva Pallas, DO  [facility] Pager [phone]  If 7PM-7AM, please contact night-coverage [email or web address] Password [facility] 08/25/2013, 6:26 PM

## 2013-08-25 NOTE — Progress Notes (Signed)
Unit CM UR Completed by [facility] ED CM  W. Arya Boxley RN  

## 2013-08-25 NOTE — Progress Notes (Signed)
Pt refused to wear bipap tonight. Sats are 60, pt knows she can call RT if she changes her mind. RT will continue to monitor.

## 2013-08-25 NOTE — ED Notes (Signed)
Pt. Is aware of needing an urine specimen 

## 2013-08-25 NOTE — ED Notes (Signed)
I Stat Lactic Acid results shown to Dr. Reather Converse

## 2013-08-26 ENCOUNTER — Encounter (HOSPITAL_COMMUNITY)
Admission: EM | Disposition: A | Discharge status: Home / Self Care | Payer: Self-pay | Source: Home / Self Care | Attending: Internal Medicine

## 2013-08-26 DIAGNOSIS — I5043 Acute on chronic combined systolic (congestive) and diastolic (congestive) heart failure: Secondary | ICD-10-CM | POA: Diagnosis not present

## 2013-08-26 DIAGNOSIS — R0989 Other specified symptoms and signs involving the circulatory and respiratory systems: Secondary | ICD-10-CM | POA: Diagnosis not present

## 2013-08-26 DIAGNOSIS — I214 Non-ST elevation (NSTEMI) myocardial infarction: Secondary | ICD-10-CM | POA: Diagnosis present

## 2013-08-26 DIAGNOSIS — I5042 Chronic combined systolic (congestive) and diastolic (congestive) heart failure: Secondary | ICD-10-CM | POA: Diagnosis present

## 2013-08-26 DIAGNOSIS — I2510 Atherosclerotic heart disease of native coronary artery without angina pectoris: Secondary | ICD-10-CM

## 2013-08-26 HISTORY — PX: LEFT HEART CATHETERIZATION WITH CORONARY ANGIOGRAM: SHX5451

## 2013-08-26 LAB — CBC
HCT: 33.4 % — ABNORMAL LOW (ref 36.0–46.0)
Hemoglobin: 10.3 g/dL — ABNORMAL LOW (ref 12.0–15.0)
MCH: 31.9 pg (ref 26.0–34.0)
MCHC: 30.8 g/dL (ref 30.0–36.0)
MCV: 103.4 fL — ABNORMAL HIGH (ref 78.0–100.0)
Platelets: 121 10*3/uL — ABNORMAL LOW (ref 150–400)
RBC: 3.23 MIL/uL — ABNORMAL LOW (ref 3.87–5.11)
RDW: 15.4 % (ref 11.5–15.5)
WBC: 8.3 10*3/uL (ref 4.0–10.5)

## 2013-08-26 LAB — COMPREHENSIVE METABOLIC PANEL
ALT: <5 U/L (ref 0–35)
AST: 14 U/L (ref 0–37)
Albumin: 2.8 g/dL — ABNORMAL LOW (ref 3.5–5.2)
Alkaline Phosphatase: 63 U/L (ref 39–117)
Anion gap: 16 — ABNORMAL HIGH (ref 5–15)
BILIRUBIN TOTAL: 0.4 mg/dL (ref 0.3–1.2)
BUN: 5 mg/dL — ABNORMAL LOW (ref 6–23)
CHLORIDE: 98 meq/L (ref 96–112)
CO2: 27 mEq/L (ref 19–32)
Calcium: 9 mg/dL (ref 8.4–10.5)
Creatinine, Ser: 2.6 mg/dL — ABNORMAL HIGH (ref 0.50–1.10)
GFR calc Af Amer: 21 mL/min — ABNORMAL LOW (ref >90)
GFR calc non Af Amer: 18 mL/min — ABNORMAL LOW (ref >90)
Glucose, Bld: 84 mg/dL (ref 70–99)
POTASSIUM: 3.1 meq/L — AB (ref 3.7–5.3)
Sodium: 141 mEq/L (ref 137–147)
Total Protein: 6.7 g/dL (ref 6.0–8.3)

## 2013-08-26 LAB — POCT ACTIVATED CLOTTING TIME: Activated Clotting Time: 168 seconds

## 2013-08-26 LAB — MAGNESIUM: MAGNESIUM: 2 mg/dL (ref 1.5–2.5)

## 2013-08-26 LAB — MRSA PCR SCREENING: MRSA by PCR: NEGATIVE

## 2013-08-26 LAB — TROPONIN I
Troponin I: 1.09 ng/mL (ref <0.30)
Troponin I: 1.42 ng/mL (ref <0.30)

## 2013-08-26 LAB — HEPARIN LEVEL (UNFRACTIONATED): Heparin Unfractionated: 0.26 IU/mL — ABNORMAL LOW (ref 0.30–0.70)

## 2013-08-26 SURGERY — LEFT HEART CATHETERIZATION WITH CORONARY ANGIOGRAM
Anesthesia: LOCAL

## 2013-08-26 MED ORDER — HEPARIN SODIUM (PORCINE) 1000 UNIT/ML IJ SOLN
INTRAMUSCULAR | Status: AC
  Filled 2013-08-26: qty 1

## 2013-08-26 MED ORDER — PENTAFLUOROPROP-TETRAFLUOROETH EX AERO: 1.0000 "application " | INHALATION_SPRAY | CUTANEOUS | Status: DC | PRN

## 2013-08-26 MED ORDER — SODIUM CHLORIDE 0.9 % IV SOLN: 1.0000 mL/kg/h | INTRAVENOUS | Status: AC

## 2013-08-26 MED ORDER — SODIUM CHLORIDE 0.9 % IJ SOLN: 3.0000 mL | INTRAMUSCULAR | Status: DC | PRN

## 2013-08-26 MED ORDER — NEPRO/CARBSTEADY PO LIQD
237.0000 mL | ORAL | Status: DC | PRN
  Filled 2013-08-26: qty 237

## 2013-08-26 MED ORDER — LIDOCAINE-PRILOCAINE 2.5-2.5 % EX CREA: 1.0000 "application " | TOPICAL_CREAM | CUTANEOUS | Status: DC | PRN

## 2013-08-26 MED ORDER — DOXERCALCIFEROL 4 MCG/2ML IV SOLN
2.0000 ug | INTRAVENOUS | Status: DC
  Administered 2013-08-27: 2 ug via INTRAVENOUS

## 2013-08-26 MED ORDER — HEPARIN SODIUM (PORCINE) 1000 UNIT/ML DIALYSIS: 1000.0000 [IU] | INTRAMUSCULAR | Status: DC | PRN

## 2013-08-26 MED ORDER — HYDRALAZINE HCL 20 MG/ML IJ SOLN
10.0000 mg | INTRAMUSCULAR | Status: DC | PRN
Start: 2013-08-26 — End: 2013-08-27

## 2013-08-26 MED ORDER — HEART ATTACK BOUNCING BOOK
Freq: Once | Status: AC
  Administered 2013-08-26: 21:00:00
  Filled 2013-08-26: qty 1

## 2013-08-26 MED ORDER — ALTEPLASE 2 MG IJ SOLR: 2.0000 mg | Freq: Once | INTRAMUSCULAR | Status: AC | PRN

## 2013-08-26 MED ORDER — HEPARIN (PORCINE) IN NACL 100-0.45 UNIT/ML-% IJ SOLN
1100.0000 [IU]/h | INTRAMUSCULAR | Status: DC
  Administered 2013-08-26: 900 [IU]/h via INTRAVENOUS
  Administered 2013-08-26: 1100 [IU]/h via INTRAVENOUS
  Filled 2013-08-26 (×2): qty 250

## 2013-08-26 MED ORDER — LIDOCAINE HCL (PF) 1 % IJ SOLN
INTRAMUSCULAR | Status: AC
  Filled 2013-08-26: qty 30

## 2013-08-26 MED ORDER — HEPARIN BOLUS VIA INFUSION
2000.0000 [IU] | Freq: Once | INTRAVENOUS | Status: AC
  Administered 2013-08-26: 2000 [IU] via INTRAVENOUS
  Filled 2013-08-26: qty 2000

## 2013-08-26 MED ORDER — SODIUM CHLORIDE 0.9 % IV SOLN: 100.0000 mL | INTRAVENOUS | Status: DC | PRN

## 2013-08-26 MED ORDER — SODIUM CHLORIDE 0.9 % IV SOLN: 250.0000 mL | INTRAVENOUS | Status: DC | PRN

## 2013-08-26 MED ORDER — SODIUM CHLORIDE 0.9 % IJ SOLN: 3.0000 mL | Freq: Two times a day (BID) | INTRAMUSCULAR | Status: DC

## 2013-08-26 MED ORDER — FENTANYL CITRATE 0.05 MG/ML IJ SOLN
INTRAMUSCULAR | Status: AC
  Filled 2013-08-26: qty 2

## 2013-08-26 MED ORDER — ASPIRIN 81 MG PO CHEW: 81.0000 mg | CHEWABLE_TABLET | ORAL | Status: DC

## 2013-08-26 MED ORDER — NITROGLYCERIN 1 MG/10 ML FOR IR/CATH LAB
INTRA_ARTERIAL | Status: AC
  Filled 2013-08-26: qty 10

## 2013-08-26 MED ORDER — POTASSIUM CHLORIDE CRYS ER 20 MEQ PO TBCR
20.0000 meq | EXTENDED_RELEASE_TABLET | Freq: Once | ORAL | Status: AC
  Administered 2013-08-26: 20 meq via ORAL
  Filled 2013-08-26: qty 1

## 2013-08-26 MED ORDER — SODIUM CHLORIDE 0.9 % IV SOLN
62.5000 mg | INTRAVENOUS | Status: DC
  Administered 2013-08-27: 62.5 mg via INTRAVENOUS
  Filled 2013-08-26 (×2): qty 5

## 2013-08-26 MED ORDER — MIDAZOLAM HCL 2 MG/2ML IJ SOLN
INTRAMUSCULAR | Status: AC
  Filled 2013-08-26: qty 2

## 2013-08-26 MED ORDER — DARBEPOETIN ALFA-POLYSORBATE 60 MCG/0.3ML IJ SOLN
60.0000 ug | INTRAMUSCULAR | Status: DC
  Administered 2013-08-27: 60 ug via INTRAVENOUS
  Filled 2013-08-26: qty 0.3

## 2013-08-26 MED ORDER — LIDOCAINE HCL (PF) 1 % IJ SOLN: 5.0000 mL | INTRAMUSCULAR | Status: DC | PRN

## 2013-08-26 NOTE — Progress Notes (Signed)
ANTICOAGULATION CONSULT NOTE - Initial Consult  Pharmacy Consult for Heparin Indication: chest pain/ACS  No Known Allergies  Patient Measurements: Height: 5\' 11"  (180.3 cm) Weight: 165 lb 12.6 oz (75.2 kg) IBW/kg (Calculated) : 70.8  Vital Signs: Temp: 98.4 F (36.9 C) (07/13 2332) Temp src: Oral (07/13 2332) BP: 146/77 mmHg (07/13 2353) Pulse Rate: 90 (07/13 2253)  Labs:  Recent Labs  08/25/13 1520 08/25/13 2350  HGB 10.5*  --   HCT 34.1*  --   PLT 231  --   CREATININE 2.59*  --   TROPONINI <0.30 1.09*    Estimated Creatinine Clearance: 24.5 ml/min (by C-G formula based on Cr of 2.59).   Medical History: Past Medical History  Diagnosis Date  . Obesity   . Uterine polyp   . Cervical dysplasia   . Hypertension   . Chronic kidney disease   . Constipation   . Gout   . Constipation   . Abnormal Pap smear     ASCUS ?HGSIL    Medications:  Facility-administered medications prior to admission  Medication Dose Route Frequency Provider Last Rate Last Dose  . 0.9 %  sodium chloride infusion  500 mL Intravenous Continuous Inda Castle, MD       Prescriptions prior to admission  Medication Sig Dispense Refill  . allopurinol (ZYLOPRIM) 100 MG tablet Take 100 mg by mouth daily.        Marland Kitchen atenolol (TENORMIN) 100 MG tablet Take 100 mg by mouth daily.       . calcitRIOL (ROCALTROL) 0.25 MCG capsule Take 1 capsule by mouth daily.      . calcium acetate (PHOSLO) 667 MG capsule Take by mouth 3 (three) times daily with meals.      . cloNIDine (CATAPRES) 0.2 MG tablet Take 0.2 mg by mouth 2 (two) times daily.        . furosemide (LASIX) 40 MG tablet Take 40 mg by mouth 2 (two) times daily.        Marland Kitchen HYDROcodone-acetaminophen (VICODIN) 5-500 MG per tablet Take 1 tablet by mouth every 6 (six) hours as needed. For pain      . loratadine (CLARITIN) 10 MG tablet Take 10 mg by mouth daily. For allergies      . oxyCODONE-acetaminophen (ROXICET) 5-325 MG per tablet Take 1-2  tablets by mouth every 4 (four) hours as needed for pain.  20 tablet  0  . sodium bicarbonate 650 MG tablet Take 650 mg by mouth 3 (three) times daily.        Assessment: 63 yo female with EKG changes/elevated cardiac markes for heparin. Received heparin 500 units SQ at midnight  Goal of Therapy:  Heparin level 0.3-0.7 units/ml Monitor platelets by anticoagulation protocol: Yes   Plan:  Heparin 2000 units IV bolus, then 900 units/hr Check heparin level in 8 hours.   Caryl Pina 08/26/2013,1:25 AM

## 2013-08-26 NOTE — Progress Notes (Signed)
  [facility] Progress Note   Subjective: Feeling better, no SOB today. 3kg removed last night w HD  Filed Vitals:   08/25/13 2353 08/26/13 0434 08/26/13 0732 08/26/13 0741  BP: 146/77 139/82 159/87   Pulse:  83 85   Temp:  97.9 F (36.6 C)  99.1 F (37.3 C)  TempSrc:  Oral  Oral  Resp:  26 19   Height:      Weight:  75.2 kg (165 lb 12.6 oz)    SpO2:  98% 97%    Exam: Calm, no distress No jvd  Chest w L basilar rales, R clear RRR no MRG transmitted bruit from L arm access Abd soft, NTND Pedal edema improved Neuro is nf, Ox 3  LUA AVF patent   ECHO 08/25/13 EF 40-45%   HD: [facility]  4h   250 > 400/800   78kg   2/2.0 Bath    AVF LUA    Heparin 4000  Aranesp 60 ug and Venofer 50 mg q Wed, Hectorol 2 ug TIW  Lasb: tsat 29%, ferr 493, Hb 10.1 -- phos 4.4, pth 211  Assessment:  1 Acute resp failure / pulm edema- better after HD yest. For heart cath today, has new wall motion abnormality on echo. Will need new lower dry wt at d/c 2 ESRD on HD - recent start 6 wks ago, increased BFR to 400.  3 HTN uncontrolled - on BB and clonidine at home  4 Hx gout  5 Anemia on aranesp  6 HPTH cont binders, vit D  Plan- HD tomorrow, further reduce volume as tolerated    Kelly Splinter MD  pager [phone]    cell [phone]  08/26/2013, 10:17 AM     Recent Labs Lab 08/25/13 1520 08/26/13 0431  NA 139 141  K 2.4* 3.1*  CL 93* 98  CO2 28 27  GLUCOSE 224* 84  BUN 6 5*  CREATININE 2.59* 2.60*  CALCIUM 8.8 9.0    Recent Labs Lab 08/26/13 0431  AST 14  ALT <5  ALKPHOS 63  BILITOT 0.4  PROT 6.7  ALBUMIN 2.8*    Recent Labs Lab 08/25/13 1520 08/26/13 0431  WBC 10.2 8.3  NEUTROABS 5.9  --   HGB 10.5* 10.3*  HCT 34.1* 33.4*  MCV 104.9* 103.4*  PLT 231 121*   . allopurinol  100 mg Oral Daily  . aspirin EC  325 mg Oral Daily  . atenolol  100 mg Oral Daily  . calcitRIOL  0.25 mcg Oral Daily  . calcium acetate  667 mg Oral TID WC  . cloNIDine  0.2 mg  Oral BID  . folic acid  1 mg Oral Daily  . loratadine  10 mg Oral Daily  . multivitamin with minerals  1 tablet Oral Daily  . sodium chloride  3 mL Intravenous Q12H  . thiamine  100 mg Oral Daily   Or  . thiamine  100 mg Intravenous Daily   . heparin 900 Units/hr (08/26/13 0149)  . nitroGLYCERIN 100 mcg/min (08/25/13 1716)  . nitroGLYCERIN Stopped (08/25/13 2253)   sodium chloride, sodium chloride, feeding supplement (NEPRO CARB STEADY), heparin, heparin, lidocaine-prilocaine, LORazepam, LORazepam, nitroGLYCERIN, ondansetron, oxyCODONE-acetaminophen, pentafluoroprop-tetrafluoroeth

## 2013-08-26 NOTE — Interval H&P Note (Signed)
History and Physical Interval Note:  08/26/2013 5:20 PM  Jessica Clarke  has presented today for CARDIAC CATHETERIZATION, with the diagnosis of NSTEMI / Acute Combined CHF. Per Consult by Dr. Tamala Julian: 63 year old with end-stage renal disease who suddenly developed dyspnea while on dialysis. She was brought to the emergency room where BiPAP was placed. Severe blood pressure elevations were noted. She is now starting to feel better. Prior echocardiogram 9 years ago revealed LVH and diastolic dysfunction. She denies any chest discomfort associated with today's episode  She has a history of hypertension, heavy cigarette smoking, and daily alcohol use, greater than 32 ounces of beer daily.  A summation gallop is heard on auscultation.  ECG does not demonstrate any acute ST elevation. There is junctional ST depression in V5 and the inferior leads.  The patient is not having an acute ST elevation myocardial infarction. She is in acute pulmonary edema. Etiology is uncertain. This could simply be volume overload and hypertension related to her underlying chronic kidney disease and alcohol abuse. Ischemic heart disease will need to be excluded, and the extent of the evaluation will depend on her clinical course and whether or not the cardiac markers are elevated. If the markers become elevated she will need coronary angiography. She has ruled in with + Troponin levels & Echo shows EF range of 40% to 45%. Hypokinesis of the mid-apicalanteroseptal, anterior, and apical myocardium.  Cardiac Catheterization recommended.   The various methods of treatment have been discussed with the patient and family. After consideration of risks, benefits and other options for treatment, the patient has consented to  Procedure(s): LEFT HEART CATHETERIZATION WITH CORONARY ANGIOGRAM (N/A) +/- PCI as a surgical intervention .  The patient's history has been reviewed, patient examined, no change in status, stable for surgery.  I have  reviewed the patient's chart and labs.  Questions were answered to the patient's satisfaction.     Tyjah Hai W  Cath Lab Visit (complete for each Cath Lab visit)  Clinical Evaluation Leading to the Procedure:   ACS: Yes.    Non-ACS:    Anginal Classification: CCS IV  Anti-ischemic medical therapy: Maximal Therapy (2 or more classes of medications)  Non-Invasive Test Results: Intermediate-risk stress test findings: cardiac mortality 1-3%/year --EF 40-45% with Anterior WMA on Echo  Prior CABG: No previous CABG

## 2013-08-26 NOTE — Clinical Documentation Improvement (Signed)
  Per chart patient admitted with "Flash pulmonary edema" with BNP 70,000, Acute respiratory failure requiring Bipap and NSTEMI. EF 40% with ICM. Patient treated with emergent HD. Please clarify in Notes if "Flash pulmonary edema" equates to Acute CHF and if so would it be Systolic, Diastolic or Mixed? Thank you.  Thank You, Ezekiel Ina ,RN Clinical Documentation Specialist:  [phone]  [place] Information Management

## 2013-08-26 NOTE — Progress Notes (Signed)
PROGRESS NOTE  Jessica Clarke MWN:[id number] DOB: 02/27/1950 DOA: 08/25/2013 PCP: Irena Cords, MD  Assessment/Plan: acute respiratory failure  -Secondary to flash pulmonary edema  -Patient is presently stable on nasal cannula   Flash Pulmonary Edema  -Unclear if this is due to hypertensive emergency versus primary cardiac ischemia versus noncompliance  -s/p dialysis   -Cycle troponins - elevated  NSTEMI Cards following Heparin gtt -cath?   HTN Emergency  -Patient endorses compliance to her medications  - blood pressure improved with dialysis and fluid removal  -Continue home antihypertensive medications   alcohol abuse  -The patient states that she drinks 32 ounces of beer daily  -Alcohol withdrawal protocol   ESRD  -Nephrology is following  -Metabolic bone disease management per nephrology   Hypokalemia -defer to nephrology for replacement -check Mg  Code Status: full Family Communication: patient Disposition Plan: transfer to tele bed after cath??   Consultants:  cards  Procedures:      HPI/Subjective: No further CP, no SOB  Objective: Filed Vitals:   08/26/13 0741  BP:   Pulse:   Temp: 99.1 F (37.3 C)  Resp:     Intake/Output Summary (Last 24 hours) at 08/26/13 0818 Last data filed at 08/26/13 0600  Gross per 24 hour  Intake  80.65 ml  Output   2933 ml  Net -2852.35 ml   Filed Weights   08/25/13 1501 08/25/13 2253 08/26/13 0434  Weight: 80.3 kg (177 lb 0.5 oz) 75.2 kg (165 lb 12.6 oz) 75.2 kg (165 lb 12.6 oz)    Exam:   General:  A+Ox3, NAD  Cardiovascular: rrr  Respiratory: clear, no wheezing  Abdomen: +BS, soft  Musculoskeletal: moves all 4 ext  Data Reviewed: Basic Metabolic Panel:  Recent Labs Lab 08/25/13 1520 08/26/13 0431  NA 139 141  K 2.4* 3.1*  CL 93* 98  CO2 28 27  GLUCOSE 224* 84  BUN 6 5*  CREATININE 2.59* 2.60*  CALCIUM 8.8 9.0   Liver Function Tests:  Recent Labs Lab 08/26/13 0431    AST 14  ALT <5  ALKPHOS 63  BILITOT 0.4  PROT 6.7  ALBUMIN 2.8*   No results found for this basename: LIPASE, AMYLASE,  in the last 168 hours  Recent Labs Lab 08/25/13 1520  AMMONIA 29   CBC:  Recent Labs Lab 08/25/13 1520 08/26/13 0431  WBC 10.2 8.3  NEUTROABS 5.9  --   HGB 10.5* 10.3*  HCT 34.1* 33.4*  MCV 104.9* 103.4*  PLT 231 121*   Cardiac Enzymes:  Recent Labs Lab 08/25/13 1520 08/25/13 2350 08/26/13 0431  TROPONINI <0.30 1.09* 1.42*   BNP (last 3 results)  Recent Labs  08/25/13 1520  PROBNP >70000.0*   CBG:  Recent Labs Lab 08/25/13 1504  GLUCAP 176*    Recent Results (from the past 240 hour(s))  MRSA PCR SCREENING     Status: None   Collection Time    08/25/13 10:58 PM      Result Value Ref Range Status   MRSA by PCR NEGATIVE  NEGATIVE Final   Comment:            The GeneXpert MRSA Assay (FDA     approved for NASAL specimens     only), is one component of a     comprehensive MRSA colonization     surveillance program. It is not     intended to diagnose MRSA     infection nor to guide or  monitor treatment for     MRSA infections.     Studies: Dg Chest Port 1 View  08/25/2013   CLINICAL DATA:  Severe shortness of breath.  EXAM: PORTABLE CHEST - 1 VIEW  COMPARISON:  03/11/2012  FINDINGS: The patient has bilateral pulmonary edema. There is cardiomegaly. Pulmonary vascularity is within normal limits. No discrete effusions.  No acute osseous abnormality.  IMPRESSION: Bilateral hazy pulmonary infiltrates consistent with pulmonary edema.   Electronically Signed   By: Geanie Cooley M.D.   On: 08/25/2013 15:52    Scheduled Meds: . allopurinol  100 mg Oral Daily  . aspirin EC  325 mg Oral Daily  . atenolol  100 mg Oral Daily  . calcitRIOL  0.25 mcg Oral Daily  . calcium acetate  667 mg Oral TID WC  . cloNIDine  0.2 mg Oral BID  . folic acid  1 mg Oral Daily  . loratadine  10 mg Oral Daily  . multivitamin with minerals  1 tablet  Oral Daily  . sodium chloride  3 mL Intravenous Q12H  . thiamine  100 mg Oral Daily   Or  . thiamine  100 mg Intravenous Daily   Continuous Infusions: . heparin 900 Units/hr (08/26/13 0149)  . nitroGLYCERIN 100 mcg/min (08/25/13 1716)  . nitroGLYCERIN Stopped (08/25/13 2253)   Antibiotics Given (last 72 hours)   None      Principal Problem:   Acute respiratory failure Active Problems:   Hypertension   Smoker   End stage renal disease   Alcohol abuse   Flash pulmonary edema   NSTEMI (non-ST elevated myocardial infarction)   Cardiomyopathy, ischemic- EF 45% with WMA    Time spent: 35 min    Makylah Bossard  [facility] Pager [phone]. If 7PM-7AM, please contact night-coverage at [email or web address], password [facility] 08/26/2013, 8:18 AM  LOS: 1 day

## 2013-08-26 NOTE — Progress Notes (Signed)
O2 sat 87% at rest on RA.  No resp distress noted.  Placed back on 2L per [place], up to 97%.

## 2013-08-26 NOTE — CV Procedure (Signed)
CARDIAC CATHETERIZATION REPORT  NAME:  Jessica Clarke   MRN: [id number] DOB:  1950/07/12   ADMIT DATE: 08/25/2013 Procedure Date: 08/26/2013  INTERVENTIONAL CARDIOLOGIST: Leonie Man, M.D., MS PRIMARY CARE PROVIDER: Donetta Potts, MD PRIMARY CARDIOLOGIST: Sinclair Grooms, MD  PATIENT:  Jessica Clarke is a 63 y.o. female with end-stage renal disease who suddenly developed dyspnea while on dialysis. She was brought to the emergency room where BiPAP was placed. Severe blood pressure elevations were noted. She is now starting to feel better. Prior echocardiogram 9 years ago revealed LVH and diastolic dysfunction. She denies any chest discomfort associated with today's episode  She has a history of hypertension, heavy cigarette smoking, and daily alcohol use, greater than 32 ounces of beer daily.  A summation gallop is heard on auscultation.  ECG does not demonstrate any acute ST elevation. There is junctional ST depression in V5 and the inferior leads.  The patient is not having an acute ST elevation myocardial infarction. She is in acute pulmonary edema. Etiology is uncertain. This could simply be volume overload and hypertension related to her underlying chronic kidney disease and alcohol abuse. Ischemic heart disease will need to be excluded, and the extent of the evaluation will depend on her clinical course and whether or not the cardiac markers are elevated. She has ruled in with + Troponin levels & Echo shows EF range of 40% to 45%. Hypokinesis of the mid-apicalanteroseptal, anterior, and apical myocardium. She is referred for Cardiac Catheterization.  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  Radial access was not used due to HD. Sterile technique was used including antiseptics, cap, gloves, gown, hand hygiene, mask and sheet. Skin prep: Chlorhexidine.   Consent: Risks of procedure as well as the alternatives and risks of each were explained to the (patient/caregiver). Consent for procedure obtained.   Time Out: Verified patient identification, verified procedure, site/side was marked, verified correct patient position, special equipment/implants available, medications/allergies/relevent history reviewed, required imaging and test results available. Performed.  Access:   Right Common Femoral Artery: 5 Fr Sheath -  fluoroscopically guided modified Seldinger Technique   Left Heart Catheterization: 5Fr Catheters advanced or exchanged over a standard J-wire; JL4 catheter advanced first.  Left Coronary Artery Cineangiography: JL4 Catheter  Right Coronary Artery: JR4 Catheter   LV Hemodynamics (LV Gram): Angled Pigtail Catheter  Sheath removed in the cath lab with manual pressure for hemostasis.   FINDINGS:  Hemodynamics:   Central Aortic Pressure / Mean: 146/74/105 mmHg  Left Ventricular Pressure / LVEDP: 149/4/10 mmHg  Left Ventriculography: Deferred  Coronary Anatomy:  Dominance: Right  Left Main: Large-caliber, short vessel that trifurcates into the LAD, Ramus Intermedius, and Circumflex. Mild calcification though angiographically normal. LAD: Large-caliber vessel that wraps the apex perfusing the distal half of the infero-apex and inferior wall. There is a small first diagonal branch and a moderate caliber major second diagonal branch that actually perfuses the posterolateral wall. There is mild bridging in the mid LAD but no significant lesions in either the LAD or diagonal. A very small mild diagonal/ramus branch comes off proximally as roughly 60% stenosis  there  is hazy. This is not a very significant vessel.  Left Circumflex: Moderate to large caliber, nondominant vessel it gives off OM 1 prior to going to the AV groove. As it courses in the AV groove it become smaller in diameter it gives off a small posterior lateral branch. OM1 is moderate caliber with no significant disease.  Ramus intermedius: Large-caliber vessel that bifurcates distally. Tortuous but no significant lesions.   RCA: Large-caliber, likely dominant vessel with diffuse disease throughout the mid vessel. It terminates distally with a very small posterior descending artery and RPL system. There is really minimal distal circulation beyond the bifurcation. No significant lesions noted. There is an very proximal RV marginal/atrial branch that comes off almost of an anomalous circumflex but does not course as such.    MEDICATIONS:  Anesthesia:  Local Lidocaine 16 ml  Sedation:  1 mg IV Versed, 25 mcg IV fentanyl ;   Omnipaque Contrast: 70 ml  IV Labetalol 20 mg  PATIENT DISPOSITION:    The patient was transferred to the PACU holding area in a hemodynamicaly stable, chest pain free condition.  The patient tolerated the procedure well, and there were no complications.  EBL:   < 10 ml  The patient was stable before, during, and after the procedure.  POST-OPERATIVE DIAGNOSIS:    Mild, diffuse coronary disease but no obstructive lesions to explain positive Troponins for the anterior wall motion abnormality noted on echo.  Relatively normal LVEDP of 10 mm Hg  PLAN OF CARE:  The patient will be transferred to the post procedural unit for monitoring of the groin post sheath removal. Anticipate that she will undergo dialysis either tonight or tomorrow.  Would continue aggressive risk factor modification and treatment of her cardiomyopathy the does not appear to be ischemic in nature. Continue aggressive blood pressure control.   Leonie Man, M.D., M.S. [facility]  [facility] [address] [place], [place]  [address]  [phone]  08/26/2013 6:18 PM

## 2013-08-26 NOTE — Progress Notes (Signed)
Pt 2nd troponin bumped to 1.09 from < .30 on admission. She had some EKG changes on admission but no chest pain. Cardio was consulted and pt was made NPO after MN tonight for cardiac cath this am. This NP called cardio fellow on call who agreed with starting Heparin gtt given elevated troponin and EKG changes earlier. Still without active chest pain. Respiratory status improved and pt is off NTG gtt for HTN emergency earlier. This NP reviewed chart and see no hx GIB or other reason not to place her on Heparin. Will d/c any chemical VTE ppx if on.  Clance Boll, NP [facility]

## 2013-08-26 NOTE — Progress Notes (Signed)
Pharmacy Consult - Heparin  Heparin level low at 0.26 Planning cath today  Plan: 1) Increase heparin to 1100 units / hr 2) Follow up after cath  Thank you. Anette Guarneri, PharmD [phone]

## 2013-08-26 NOTE — H&P (View-Only) (Signed)
Reason for Consult:  CHF, EKG changes Referring Physician: ER  Andreal Vultaggio is an 63 y.o. female.  HPI:   The patient is a 63 yo female with a history of HTN, CKD-ESRD, tobacco abuse, obesity, gout.  No prior cardiac history that I could see from chart review.  Her last echo was from 2006 with normal EF, moderate diastolic dysfunction, normal valve function.  She presented from HD hypoxic with frothy sputum after having about 2 hours of treatment.  She is currently on bi-pap with Ketamine.   I was unable to complete a ROS.  EKG reveals inferior/lat ST depression, LVH.  Past Medical History  Diagnosis Date  . Obesity   . Uterine polyp   . Cervical dysplasia   . Hypertension   . Chronic kidney disease   . Constipation   . Gout   . Constipation   . Abnormal Pap smear     ASCUS ?HGSIL    Past Surgical History  Procedure Laterality Date  . Hysteroscopy    . Leep    . Dilation and curettage of uterus    . Av fistula placement  01/18/2012    Procedure: ARTERIOVENOUS (AV) FISTULA CREATION;  Surgeon: Angelia Mould, MD;  Location: [facility] OR;  Service: Vascular;  Laterality: Left;    Family History  Problem Relation Age of Onset  . Colon cancer Neg Hx   . Esophageal cancer    . Pancreatic cancer    . Heart disease Mother   . Hypertension Mother   . Breast cancer Sister   . Esophageal cancer Sister   . Cancer Sister   . Deep vein thrombosis Sister   . Diabetes Sister   . Hypertension Sister   . Hypertension Daughter     Social History:  reports that she has been smoking Cigarettes.  She has a 30 pack-year smoking history. She has never used smokeless tobacco. She reports that she drinks about 3.6 ounces of alcohol per week. She reports that she does not use illicit drugs.  Allergies: No Known Allergies  Medications:  Prior to Admission medications   Medication Sig Start Date End Date Taking? Authorizing Provider  allopurinol (ZYLOPRIM) 100 MG tablet Take 100 mg  by mouth daily.      Historical Provider, MD  atenolol (TENORMIN) 100 MG tablet Take 100 mg by mouth daily.     Historical Provider, MD  calcitRIOL (ROCALTROL) 0.25 MCG capsule Take 1 capsule by mouth daily. 09/10/12   Historical Provider, MD  calcium acetate (PHOSLO) 667 MG capsule Take by mouth 3 (three) times daily with meals.    Historical Provider, MD  cloNIDine (CATAPRES) 0.2 MG tablet Take 0.2 mg by mouth 2 (two) times daily.      Historical Provider, MD  furosemide (LASIX) 40 MG tablet Take 40 mg by mouth 2 (two) times daily.      Historical Provider, MD  HYDROcodone-acetaminophen (VICODIN) 5-500 MG per tablet Take 1 tablet by mouth every 6 (six) hours as needed. For pain    Historical Provider, MD  loratadine (CLARITIN) 10 MG tablet Take 10 mg by mouth daily. For allergies    Historical Provider, MD  oxyCODONE-acetaminophen (ROXICET) 5-325 MG per tablet Take 1-2 tablets by mouth every 4 (four) hours as needed for pain. 01/18/12   Angelia Mould, MD  sodium bicarbonate 650 MG tablet Take 650 mg by mouth 3 (three) times daily.    Historical Provider, MD     Results  for orders placed during the hospital encounter of 08/25/13 (from the past 48 hour(s))  CBG MONITORING, ED     Status: Abnormal   Collection Time    08/25/13  3:04 PM      Result Value Ref Range   Glucose-Capillary 176 (*) 70 - 99 mg/dL   Comment 1 Notify RN      No results found.  Review of Systems  Unable to perform ROS: acuity of condition   Blood pressure 107/79, pulse 70, resp. rate 27, weight 177 lb 0.5 oz (80.3 kg), SpO2 90.00%. Physical Exam  Nursing note and vitals reviewed. Constitutional: She appears well-developed and well-nourished. She appears distressed.  HENT:  Head: Normocephalic and atraumatic.  Eyes: Pupils are equal, round, and reactive to light.  Neck: JVD present.  Cardiovascular: Normal rate, regular rhythm, S1 normal and S2 normal.   No murmur heard. Pulses:      Radial pulses are  1+ on the right side, and 1+ on the left side.       Dorsalis pedis pulses are 1+ on the right side, and 1+ on the left side.  Respiratory: She has rales.  On Bi-Pap  GI: Soft. Bowel sounds are normal. She exhibits no distension. There is no tenderness.  Musculoskeletal: She exhibits no edema.  Lymphadenopathy:    She has no cervical adenopathy.  Neurological: She is alert.  Skin: Skin is warm and dry.  Psychiatric:  Given Ketamine and on bi-pap.    Assessment/Plan: Acute respiratory failure Hypoxia Abnormal EKG Acute Pulmonary edema CKD ESRD HTN Tobacco abuse  Plan:   The patient is a 63 yo female with a history of HTN, CKD-ESRD on HD tobacco abuse, obesity, gout.  No prior cardiac history.  She presents with acute resp failure and pulmonary edema.  She is on bi-pap.  IV NTG started and will continue.   I ordered a stat echo to assess LV and RV function. EKG is tachycardic and indicates inferior/lateral ischemia/LVH.  Labs pending.  Will cycle cardiac enzymes.  Need to reemphasize tobacco cessation.  Continue BB, clonidine and IV lasix.  She takes 40mg  lasix BID at home.    Tarri Fuller, PA-C 08/25/2013, 3:37 PM

## 2013-08-26 NOTE — Progress Notes (Signed)
    Subjective:  No chest pain, she denies SOB, able to lay flat  Objective:  Vital Signs in the last 24 hours: Temp:  [97.9 F (36.6 C)-99.1 F (37.3 C)] 99.1 F (37.3 C) (07/14 0741) Pulse Rate:  [50-103] 85 (07/14 0732) Resp:  [14-39] 19 (07/14 0732) BP: (107-192)/(77-120) 159/87 mmHg (07/14 0732) SpO2:  [90 %-100 %] 97 % (07/14 0732) FiO2 (%):  [40 %-100 %] 40 % (07/13 1600) Weight:  [165 lb 12.6 oz (75.2 kg)-177 lb 0.5 oz (80.3 kg)] 165 lb 12.6 oz (75.2 kg) (07/14 0434)  Intake/Output from previous day:  Intake/Output Summary (Last 24 hours) at 08/26/13 0800 Last data filed at 08/26/13 0600  Gross per 24 hour  Intake  80.65 ml  Output   2933 ml  Net -2852.35 ml    Physical Exam: General appearance: alert, cooperative and no distress Lungs: decreased breath sounds Lt Heart: regular rate and rhythm   Rate: 88  Rhythm: normal sinus rhythm  Lab Results:  Recent Labs  08/25/13 1520 08/26/13 0431  WBC 10.2 8.3  HGB 10.5* 10.3*  PLT 231 121*    Recent Labs  08/25/13 1520 08/26/13 0431  NA 139 141  K 2.4* 3.1*  CL 93* 98  CO2 28 27  GLUCOSE 224* 84  BUN 6 5*  CREATININE 2.59* 2.60*    Recent Labs  08/25/13 2350 08/26/13 0431  TROPONINI 1.09* 1.42*   No results found for this basename: INR,  in the last 72 hours  Imaging: Imaging results have been reviewed  Cardiac Studies:  Echo 08/25/13 Study Conclusions  - Left ventricle: Systolic function was mildly to moderately reduced. The estimated ejection fraction was in the range of 40% to 45%. Hypokinesis of the mid-apicalanteroseptal, anterior, and apical myocardium. Due to tachycardia, there was fusion of early and atrial contributions to ventricular filling. - Mitral valve: There was mild regurgitation. - Pulmonary arteries: Systolic pressure was mildly increased. PA peak pressure: 45 mm Hg (S).    Assessment/Plan:  63 year old with end-stage renal disease who started dialysis 6 weeks  ago. She presented with acute respiratory failure from HD on 08/25/13. Troponin peaked at 1.42. Echo shows an EF of 40-45% with ant/apical WMA.     Principal Problem:   Acute respiratory failure Active Problems:   Flash pulmonary edema   NSTEMI (non-ST elevated myocardial infarction)   Hypertension   End stage renal disease   Cardiomyopathy, ischemic- EF 45% with WMA   Smoker   Alcohol abuse    PLAN: Will discuss timing of cath with MD. She is NPO.   Kerin Ransom PA-C Beeper [phone] 08/26/2013, 8:00 AM  Patient seen.  No chest pain. Dyspnea much improved. Discussed cath with patient. She is willing to proceed today. The risks and benefits of a cardiac catheterization including, but not limited to, death, stroke, MI, kidney damage and bleeding were discussed with the patient who indicates understanding and agrees to proceed.  Agree with assessment and plan as noted by Mr. Rosalyn Gess above.

## 2013-08-26 NOTE — H&P (View-Only) (Signed)
I evaluated this patient with Mr. Tenny Craw, PA-C. All aspects of care were discussed. 63 year old with end-stage renal disease who suddenly developed dyspnea while on dialysis. She was brought to the emergency room where BiPAP was placed. Severe blood pressure elevations were noted. She is now starting to feel better. Prior echocardiogram 9 years ago revealed LVH and diastolic dysfunction. She denies any chest discomfort associated with today's episode She has a history of hypertension, heavy cigarette smoking, and daily alcohol use, greater than 32 ounces of beer daily. A summation gallop is heard on auscultation. ECG does not demonstrate any acute ST elevation. There is junctional ST depression in V5 and the inferior leads.  The patient is not having an acute ST elevation myocardial infarction. She is in acute pulmonary edema. Etiology is uncertain. This could simply be volume overload and hypertension related to her underlying chronic kidney disease and alcohol abuse. Ischemic heart disease will need to be excluded, and the extent of the evaluation will depend on her clinical course and whether or not the cardiac markers are elevated. If the markers become elevated she will need coronary angiography. If LV function on echo is normal and there are negative markers, a myocardial perfusion study may suffice.  IV nitroglycerin for now to help with blood pressure control. Consult nephrology to see if further dialysis is needed. Cycle cardiac markers to exclude infarction.

## 2013-08-26 NOTE — Progress Notes (Signed)
CRITICAL VALUE ALERT  Critical value received:  Troponin 1.09  Date of notification:  08/26/2013  Time of notification:  9539  Critical value read back:Yes.    Nurse who received alert:  Annice Pih, RN  MD notified (1st page):  Paged [facility] NP  Time of first page:  [phone]  MD notified (2nd page):  Time of second page:  Responding MD:  Lamar Blinks  Time MD responded:  [phone]

## 2013-08-27 DIAGNOSIS — I2510 Atherosclerotic heart disease of native coronary artery without angina pectoris: Secondary | ICD-10-CM | POA: Diagnosis present

## 2013-08-27 LAB — BASIC METABOLIC PANEL
Anion gap: 14 (ref 5–15)
BUN: 16 mg/dL (ref 6–23)
CALCIUM: 8.9 mg/dL (ref 8.4–10.5)
CO2: 28 meq/L (ref 19–32)
CREATININE: 4.32 mg/dL — AB (ref 0.50–1.10)
Chloride: 95 mEq/L — ABNORMAL LOW (ref 96–112)
GFR calc Af Amer: 12 mL/min — ABNORMAL LOW (ref >90)
GFR, EST NON AFRICAN AMERICAN: 10 mL/min — AB (ref >90)
GLUCOSE: 115 mg/dL — AB (ref 70–99)
Potassium: 3.3 mEq/L — ABNORMAL LOW (ref 3.7–5.3)
Sodium: 137 mEq/L (ref 137–147)

## 2013-08-27 LAB — URINALYSIS, ROUTINE W REFLEX MICROSCOPIC
GLUCOSE, UA: NEGATIVE mg/dL
Ketones, ur: NEGATIVE mg/dL
LEUKOCYTES UA: NEGATIVE
Nitrite: NEGATIVE
SPECIFIC GRAVITY, URINE: 1.026 (ref 1.005–1.030)
UROBILINOGEN UA: 0.2 mg/dL (ref 0.0–1.0)
pH: 7.5 (ref 5.0–8.0)

## 2013-08-27 LAB — URINE MICROSCOPIC-ADD ON

## 2013-08-27 LAB — CBC
HEMATOCRIT: 28.8 % — AB (ref 36.0–46.0)
Hemoglobin: 8.9 g/dL — ABNORMAL LOW (ref 12.0–15.0)
MCH: 31.9 pg (ref 26.0–34.0)
MCHC: 30.9 g/dL (ref 30.0–36.0)
MCV: 103.2 fL — AB (ref 78.0–100.0)
Platelets: 132 10*3/uL — ABNORMAL LOW (ref 150–400)
RBC: 2.79 MIL/uL — AB (ref 3.87–5.11)
RDW: 15.6 % — ABNORMAL HIGH (ref 11.5–15.5)
WBC: 6.3 10*3/uL (ref 4.0–10.5)

## 2013-08-27 MED ORDER — ADULT MULTIVITAMIN W/MINERALS CH
1.0000 | ORAL_TABLET | Freq: Every day | ORAL | Status: DC
Start: 2013-08-27 — End: 2018-09-30

## 2013-08-27 MED ORDER — THIAMINE HCL 100 MG PO TABS: 100.0000 mg | ORAL_TABLET | Freq: Every day | ORAL | Status: DC

## 2013-08-27 MED ORDER — CLONIDINE HCL 0.2 MG PO TABS: 0.2000 mg | ORAL_TABLET | Freq: Three times a day (TID) | ORAL | Status: DC

## 2013-08-27 MED ORDER — DOXERCALCIFEROL 4 MCG/2ML IV SOLN
INTRAVENOUS | Status: AC
  Filled 2013-08-27: qty 2

## 2013-08-27 MED ORDER — DARBEPOETIN ALFA-POLYSORBATE 60 MCG/0.3ML IJ SOLN
INTRAMUSCULAR | Status: AC
  Filled 2013-08-27: qty 0.3

## 2013-08-27 MED ORDER — ASPIRIN EC 325 MG PO TBEC: 325.0000 mg | DELAYED_RELEASE_TABLET | Freq: Every day | ORAL | Status: DC

## 2013-08-27 MED ORDER — ATORVASTATIN CALCIUM 10 MG PO TABS: 10.0000 mg | ORAL_TABLET | Freq: Every day | ORAL | Status: DC

## 2013-08-27 MED ORDER — ATENOLOL 100 MG PO TABS: 100.0000 mg | ORAL_TABLET | Freq: Every day | ORAL | Status: DC

## 2013-08-27 MED ORDER — FOLIC ACID 1 MG PO TABS: 1.0000 mg | ORAL_TABLET | Freq: Every day | ORAL | Status: DC

## 2013-08-27 NOTE — Progress Notes (Signed)
    Subjective:  No chest pain or SOB overnight. She did get dialysis last pm.  Objective:  Vital Signs in the last 24 hours: Temp:  [98.1 F (36.7 C)-99.1 F (37.3 C)] 98.3 F (36.8 C) (07/15 0530) Pulse Rate:  [76-98] 81 (07/15 0600) Resp:  [16-30] 18 (07/15 0530) BP: (140-198)/(78-114) 157/83 mmHg (07/15 0530) SpO2:  [88 %-100 %] 100 % (07/15 0530) Weight:  [167 lb 15.9 oz (76.2 kg)] 167 lb 15.9 oz (76.2 kg) (07/15 0010)  Intake/Output from previous day:  Intake/Output Summary (Last 24 hours) at 08/27/13 0717 Last data filed at 08/27/13 0542  Gross per 24 hour  Intake    590 ml  Output    300 ml  Net    290 ml    Physical Exam: General appearance: alert, cooperative and no distress Lungs: decreased breath sounds, rhonchi Lt base Heart: regular rate and rhythm Extremities: Rt groin - some ecchymosis,without hematoma   Rate: 60  Rhythm: normal sinus rhythm and sinus bradycardia  Lab Results:  Recent Labs  08/26/13 0431 08/27/13 0015  WBC 8.3 6.3  HGB 10.3* 8.9*  PLT 121* 132*    Recent Labs  08/26/13 0431 08/27/13 0015  NA 141 137  K 3.1* 3.3*  CL 98 95*  CO2 27 28  GLUCOSE 84 115*  BUN 5* 16  CREATININE 2.60* 4.32*    Recent Labs  08/25/13 2350 08/26/13 0431  TROPONINI 1.09* 1.42*   No results found for this basename: INR,  in the last 72 hours  Imaging: Imaging results have been reviewed   Assessment/Plan:  63 y.o. female with ESRD, started HD 6 wks ago, suddenly developed dyspnea while on dialysis. She was brought to the emergency room where BiPAP was placed. Severe blood pressure elevations were noted. Troponin peaked at 1.42. Echo AS and ant apical HK. Cath done 08/26/13 revealed non obstructive RI disease.    Principal Problem:   Acute respiratory failure Active Problems:   Flash pulmonary edema   NSTEMI-08/25/13- non obstructive CAD at cath 08/26/13   Hypertension   End stage renal disease   Cardiomyopathy, ischemic- EF 45% with  WMA   CAD- non obstructive RI disease 08/26/13   Smoker   Alcohol abuse    PLAN:  Pt counseled on smoking cessation. Will review meds with MD- consider adding Imdur and ACE or ARB and decreasing Beta blocker.  ? Plavix and low dose ASA ("hazy RI/Dx disease").   Kerin Ransom PA-C Beeper [phone] 08/27/2013, 7:17 AM

## 2013-08-27 NOTE — ED Provider Notes (Signed)
Medical screening examination/treatment/procedure(s) were conducted as a shared visit with non-physician practitioner(s) or resident and myself. I personally evaluated the patient during the encounter and agree with the findings and plan unless otherwise indicated.  I have personally reviewed any xrays and/ or EKG's with the provider and I agree with interpretation.   Emergency Ultrasound Study:   Angiocath insertion Performed by: Mariea Clonts  Consent: Verbal consent obtained. Risks and benefits: risks, benefits and alternatives were discussed Immediately prior to procedure the correct patient, procedure, equipment, support staff and site/side marked as needed.  Indication: difficult IV access Preparation: Patient was prepped and draped in the usual sterile fashion. Vein Location: right AC vein was visualized during assessment for potential access sites and was found to be patent/ easily compressed with linear ultrasound.  The needle was visualized with real-time ultrasound and guided into the vein. Gauge: 18 g  Image saved and stored.  Normal blood return.  Patient tolerance: Patient tolerated the procedure well with no immediate complications.     EMERGENCY DEPARTMENT [place] CARDIAC EXAM  "Study: Limited Ultrasound of the heart and pericardium"  INDICATIONS:Dyspnea  Multiple views of the heart and pericardium were obtained in real-time with a multi-frequency probe.  PERFORMED JE:HUDJSH  IMAGES ARCHIVED?: Yes  FINDINGS: Decreased contractility and Tamponade physiology absent  LIMITATIONS: Emergent procedure  VIEWS USED: Parasternal long axis, Parasternal short axis and Apical 4 chamber  INTERPRETATION: Cardiac activity present, Cardiac tamponade absent and Decreased contractility  Emergency Ultrasound: Limited Thoracic  Performed and interpreted by Dr Reather Converse  Longitudinal view of anterior left and right lung fields in real-time with linear probe.  Indication: dyspnea  Findings:  pos lung sliding pos B lines  Interpretation: no evidence of pneumothorax.  Images electronically archived.  CRITICAL CARE Performed by: Mariea Clonts  Total critical care time: 60 min  Critical care time was exclusive of separately billable procedures and treating other patients.  Critical care was necessary to treat or prevent imminent or life-threatening deterioration.  Critical care was time spent personally by me on the following activities: development of treatment plan with patient and/or surrogate as well as nursing, discussions with consultants, evaluation of patient's response to treatment, examination of patient, obtaining history from patient or surrogate, ordering and performing treatments and interventions, ordering and review of laboratory studies, ordering and review of radiographic studies, pulse oximetry and re-evaluation of patient's condition.  Patient came for analysis after receiving 2.5 hours and becoming acutely short of breath and agitated. Patient arrives in attempt BiPAP on patient for respiratory distress unsuccessful. Intermittently patient sets up to 70 however we were able to calm her down and hold her from removing oxygen she comes up to 90. Clinically she has crackles at the bases bilateral. Bedside ultrasound done no significant pericardial effusion, B-lines on exam concern for pulmonary edema. Patient had decreased gross EF and abnormal EKG, cardiology consult. Ketamine utilized to assist with BiPAP and patient's vitals improved. Nitroglycerin drip ordered. Plan for nephrology and close monitoring with low threshold for intubation.  Pt gradually improved in ED and able to wean bipap. Admitted to medicine with cardiology and nephro consults with emergent dialysis.  Dg Chest Port 1 View  08/25/2013 CLINICAL DATA: Severe shortness of breath. EXAM: PORTABLE CHEST - 1 VIEW COMPARISON: 03/11/2012 FINDINGS: The patient has bilateral pulmonary edema. There is cardiomegaly.  Pulmonary vascularity is within normal limits. No discrete effusions. No acute osseous abnormality. IMPRESSION: Bilateral hazy pulmonary infiltrates consistent with pulmonary edema. Electronically Signed By:  Rozetta Nunnery M.D. On: 08/25/2013 15:52   Labs Reviewed   CBC WITH DIFFERENTIAL - Abnormal; Notable for the following:    RBC  3.25 (*)     Hemoglobin  10.5 (*)     HCT  34.1 (*)     MCV  104.9 (*)     RDW  15.6 (*)     All other components within normal limits   I-STAT CG4 LACTIC ACID, ED - Abnormal; Notable for the following:    Lactic Acid, Venous  6.15 (*)     All other components within normal limits   CBG MONITORING, ED - Abnormal; Notable for the following:    Glucose-Capillary  176 (*)     All other components within normal limits   I-STAT ARTERIAL BLOOD GAS, ED - Abnormal; Notable for the following:    pCO2 arterial  54.6 (*)     pO2, Arterial  436.0 (*)     Bicarbonate  34.3 (*)     Acid-Base Excess  8.0 (*)     All other components within normal limits   URINE CULTURE   BASIC METABOLIC PANEL   URINALYSIS, ROUTINE W REFLEX MICROSCOPIC   BLOOD GAS, ARTERIAL   AMMONIA   ETHANOL   TROPONIN I   PRO B NATRIURETIC PEPTIDE   I-STAT TROPOININ, ED    Acute dyspnea, end-stage renal disease, agitation, confusion, pulmonary edema   Mariea Clonts, MD 08/27/13 [phone]

## 2013-08-27 NOTE — Progress Notes (Signed)
The patient presented with acute on chronic combined systolic and diastolic heart failure in setting of alcohol use with volume excess, ESRD, and NSTEMI type 1. Coronary angio demonstrated widely patent coronary arteries. 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 LV recovery to normal sytolic function is highly likely. No further cardiac workup recommended at this time.  Needs alcohol and tobacco cessation, volume control, and if nephrology agrees, ACE/ARB and beta blocker. Needs f/u LV assessment in 3-4 months by echo.

## 2013-08-27 NOTE — Discharge Instructions (Signed)
Follow with Primary MD COLADONATO,JOSEPH A, MD in 3 days   Get CBC, CMP, 2 view Chest X ray checked  by Primary MD next visit.    Activity: As tolerated with Full fall precautions use walker/cane & assistance as needed   Disposition Home     Diet: Renal - Heart Healthy Check your Weight same time everyday, if you gain over 2 pounds, or you develop in leg swelling, experience more shortness of breath or chest pain, call your Primary MD immediately. Follow Cardiac Low Salt Diet and 1.2 lit/day fluid restriction.   On your next visit with her primary care physician please Get Medicines reviewed and adjusted.  Please request your Prim.MD to go over all Hospital Tests and Procedure/Radiological results at the follow up, please get all Hospital records sent to your Prim MD by signing hospital release before you go home.   If you experience worsening of your admission symptoms, develop shortness of breath, life threatening emergency, suicidal or homicidal thoughts you must seek medical attention immediately by calling 911 or calling your MD immediately  if symptoms less severe.  You Must read complete instructions/literature along with all the possible adverse reactions/side effects for all the Medicines you take and that have been prescribed to you. Take any new Medicines after you have completely understood and accpet all the possible adverse reactions/side effects.   Do not drive, operating heavy machinery, perform activities at heights, swimming or participation in water activities or provide baby sitting services if your were admitted for syncope or siezures until you have seen by Primary MD or a Neurologist and advised to do so again.  Do not drive when taking Pain medications.    Do not take more than prescribed Pain, Sleep and Anxiety Medications  Special Instructions: If you have smoked or chewed Tobacco  in the last 2 yrs please stop smoking, stop any regular Alcohol  and or any  Recreational drug use.  Wear Seat belts while driving.   Please note  You were cared for by a hospitalist during your hospital stay. If you have any questions about your discharge medications or the care you received while you were in the hospital after you are discharged, you can call the unit and asked to speak with the hospitalist on call if the hospitalist that took care of you is not available. Once you are discharged, your primary care physician will handle any further medical issues. Please note that NO REFILLS for any discharge medications will be authorized once you are discharged, as it is imperative that you return to your primary care physician (or establish a relationship with a primary care physician if you do not have one) for your aftercare needs so that they can reassess your need for medications and monitor your lab values.

## 2013-08-27 NOTE — Discharge Summary (Signed)
Jessica Clarke, is a 63 y.o. female  DOB 02-05-1951  MRN [id number].  Admission date:  08/25/2013  Admitting Physician  Orson Eva, MD  Discharge Date:  08/27/2013   Primary MD  Donetta Potts, MD  Recommendations for primary care physician for things to follow:   Monitor blood pressure closely, repeat CBC BMP and a 2 view chest x-ray in a week   Admission Diagnosis  End stage renal disease [585.6] Alcohol abuse [305.00] Flash pulmonary edema [518.4] Acute respiratory failure with hypoxia [518.81]   Discharge Diagnosis  End stage renal disease [585.6] Alcohol abuse [305.00] Flash pulmonary edema [518.4] Acute respiratory failure with hypoxia [518.81]    Principal Problem:   Acute on chronic combined systolic and diastolic congestive heart failure, NYHA class 4 Active Problems:   Hypertension   Smoker   End stage renal disease   Alcohol abuse   Acute respiratory failure   Flash pulmonary edema   NSTEMI-08/25/13- non obstructive CAD at cath 08/26/13   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 Obesity   . Uterine polyp   . Cervical dysplasia   . Hypertension   . Chronic kidney disease   . Constipation   . Gout   . Constipation   . Abnormal Pap smear     ASCUS ?HGSIL    Past Surgical History  Procedure Laterality Date  . Hysteroscopy    . Leep    . Dilation and curettage of uterus    . Av fistula placement  01/18/2012    Procedure: ARTERIOVENOUS (AV) FISTULA CREATION;  Surgeon: Angelia Mould, MD;  Location: [facility];  Service: Vascular;  Laterality: Left;       History of present illness and  Hospital Course:     Kindly see H&P for history of present illness and admission details, please review complete Labs, Consult reports and Test reports for all  details in brief  HPI  from the history and physical done on the day of admission  63 year old female with a history of ESRD dialyzing on Monday, Wednesday, Fridays at [facility] presented from dialysis to the emergency department today because of acute onset of respiratory distress. After 2 hours of dialysis, the patient began having shortness of breath and increasing agitation. She was hypoxic without desaturation to the 70s. She was agitated and placed on BiPAP after being brought to the emergency department. The patient was given ketamine in preparation for intubation as well as for her agitation. Fortunately, the patient gradually improved with less agitation and was subsequently weaned off of BiPAP. Initial EKG showed ST depression V4 to V6 as well as II,III. chest x-ray showed pulmonary edema. As a result, cardiology was consulted. They did not feel that the patient needed urgently go to heart catheterization. Initial troponin was negative. However, bedside echocardiogram showed EF 40-45% with new hypokinesis / wall motion abnormality when compared to prior echos.    The patient was started  on a nitroglycerin drip as per initial blood pressure was 200/110s. With the initiation of nitroglycerin drip on BiPAP, the patient's respiratory distress improved. She has been weaned to nasal cannula with oxygen saturation 96-97% on 3 L. Cardiology presently plans for cardiac catheterization on the morning of 08/26/2013 if the patient is stable. Nephrology was consulted. The patient is presently been take dialysis for fluid removal. the patient has not had any chest discomfort. She states that when she woke up this morning she was feeling fine. She endorses compliance with dialysis and has been staying for dialysis sessions for the full duration. She also endorses compliance with all her medications. Furthermore, the patient says that she has not been drinking excessive fluid.    In the  emergency department, potassium was 2.4, calcium 8.8. WBC was 10.2. Hemoglobin was 10.5. ProBNP was >70K. chest x-ray showed pulmonary edema. EKG was discussed above. Initial troponin was negative. Ammonia was 29.      Hospital Course    1. Acute hypoxic respiratory failure secondary to acute on chronic systolic CHF causing pulmonary edema due to hypertensive crisis. Patient stable and at baseline now, blood pressure in good control, excess fluid removed by dialysis. She has no chest pain or shortness of breath no symptoms whatsoever. Blood pressure medications adjusted, she has been provided with written instructions on fluid restriction, continue home dose diuretic. Follow with PCP and cardiology on a close basis.    2.NSTEMI with acute on chronic systolic heart failure. All likely due to strain from hypertensive crisis, status post left heart cath which was unremarkable with nonobstructive CAD, placed on low dose statin along with aspirin, continue beta blocker, continue outpatient risk factor modulation. We'll follow with cardiology in the outpatient setting. Completely symptom-free now.    3. ESRD. We'll complete dialysis today, she will follow with her primary nephrologist post discharge and continue with home dialysis regimen.    4. Hypertensive crisis. Blood pressure medications adjusted, blood pressure stable now.     5. History of alcohol abuse and smoking. She claims now that her last drink was one week ago, no signs of DTs, placed on folic acid thiamine and multivitamin. Counseled to quit to quit both smoking and alcohol.     Discharge Condition: Stable   Follow UP  Follow-up Information   Follow up with Sinclair Grooms, MD. (office will call you)    Specialty:  Cardiology   Contact information:   [address] [place] [address] [phone]       Follow up with COLADONATO,JOSEPH A, MD. Schedule an appointment as soon as possible for a  visit in 3 days.   Specialty:  Nephrology   Contact information:   [place] [address] [phone]         Discharge Instructions  and  Discharge Medications         Discharge Instructions   Discharge instructions    Complete by:  As directed   Follow with Primary MD COLADONATO,JOSEPH A, MD in 3 days   Get CBC, CMP, 2 view Chest X ray checked  by Primary MD next visit.    Activity: As tolerated with Full fall precautions use walker/cane & assistance as needed   Disposition Home     Diet: Renal - Heart Healthy Check your Weight same time everyday, if you gain over 2 pounds, or you develop in leg swelling, experience more shortness of breath or chest pain, call your Primary  MD immediately. Follow Cardiac Low Salt Diet and 1.2 lit/day fluid restriction.   On your next visit with her primary care physician please Get Medicines reviewed and adjusted.  Please request your Prim.MD to go over all Hospital Tests and Procedure/Radiological results at the follow up, please get all Hospital records sent to your Prim MD by signing hospital release before you go home.   If you experience worsening of your admission symptoms, develop shortness of breath, life threatening emergency, suicidal or homicidal thoughts you must seek medical attention immediately by calling 911 or calling your MD immediately  if symptoms less severe.  You Must read complete instructions/literature along with all the possible adverse reactions/side effects for all the Medicines you take and that have been prescribed to you. Take any new Medicines after you have completely understood and accpet all the possible adverse reactions/side effects.   Do not drive, operating heavy machinery, perform activities at heights, swimming or participation in water activities or provide baby sitting services if your were admitted for syncope or siezures until you have seen by Primary MD or a Neurologist and advised  to do so again.  Do not drive when taking Pain medications.    Do not take more than prescribed Pain, Sleep and Anxiety Medications  Special Instructions: If you have smoked or chewed Tobacco  in the last 2 yrs please stop smoking, stop any regular Alcohol  and or any Recreational drug use.  Wear Seat belts while driving.   Please note  You were cared for by a hospitalist during your hospital stay. If you have any questions about your discharge medications or the care you received while you were in the hospital after you are discharged, you can call the unit and asked to speak with the hospitalist on call if the hospitalist that took care of you is not available. Once you are discharged, your primary care physician will handle any further medical issues. Please note that NO REFILLS for any discharge medications will be authorized once you are discharged, as it is imperative that you return to your primary care physician (or establish a relationship with a primary care physician if you do not have one) for your aftercare needs so that they can reassess your need for medications and monitor your lab values.  Follow with Primary MD COLADONATO,JOSEPH A, MD in 3 days   Get CBC, CMP, 2 view Chest X ray checked  by Primary MD next visit.    Activity: As tolerated with Full fall precautions use walker/cane & assistance as needed   Disposition Home     Diet: Renal - Heart Healthy Check your Weight same time everyday, if you gain over 2 pounds, or you develop in leg swelling, experience more shortness of breath or chest pain, call your Primary MD immediately. Follow Cardiac Low Salt Diet and 1.2 lit/day fluid restriction.   On your next visit with her primary care physician please Get Medicines reviewed and adjusted.  Please request your Prim.MD to go over all Hospital Tests and Procedure/Radiological results at the follow up, please get all Hospital records sent to your Prim MD by signing  hospital release before you go home.   If you experience worsening of your admission symptoms, develop shortness of breath, life threatening emergency, suicidal or homicidal thoughts you must seek medical attention immediately by calling 911 or calling your MD immediately  if symptoms less severe.  You Must read complete instructions/literature along with all the possible adverse reactions/side  effects for all the Medicines you take and that have been prescribed to you. Take any new Medicines after you have completely understood and accpet all the possible adverse reactions/side effects.   Do not drive, operating heavy machinery, perform activities at heights, swimming or participation in water activities or provide baby sitting services if your were admitted for syncope or siezures until you have seen by Primary MD or a Neurologist and advised to do so again.  Do not drive when taking Pain medications.    Do not take more than prescribed Pain, Sleep and Anxiety Medications  Special Instructions: If you have smoked or chewed Tobacco  in the last 2 yrs please stop smoking, stop any regular Alcohol  and or any Recreational drug use.  Wear Seat belts while driving.   Please note  You were cared for by a hospitalist during your hospital stay. If you have any questions about your discharge medications or the care you received while you were in the hospital after you are discharged, you can call the unit and asked to speak with the hospitalist on call if the hospitalist that took care of you is not available. Once you are discharged, your primary care physician will handle any further medical issues. Please note that NO REFILLS for any discharge medications will be authorized once you are discharged, as it is imperative that you return to your primary care physician (or establish a relationship with a primary care physician if you do not have one) for your aftercare needs so that they can reassess  your need for medications and monitor your lab values.     Increase activity slowly    Complete by:  As directed             Medication List         allopurinol 100 MG tablet  Commonly known as:  ZYLOPRIM  Take 100 mg by mouth daily.     aspirin EC 325 MG tablet  Take 1 tablet (325 mg total) by mouth daily.     atenolol 100 MG tablet  Commonly known as:  TENORMIN  Take 1 tablet (100 mg total) by mouth daily.     atorvastatin 10 MG tablet  Commonly known as:  LIPITOR  Take 1 tablet (10 mg total) by mouth daily. Can use any approved statin same dose     calcitRIOL 0.25 MCG capsule  Commonly known as:  ROCALTROL  Take 1 capsule by mouth daily.     calcium acetate 667 MG capsule  Commonly known as:  PHOSLO  Take by mouth 3 (three) times daily with meals.     cloNIDine 0.2 MG tablet  Commonly known as:  CATAPRES  Take 1 tablet (0.2 mg total) by mouth 3 (three) times daily.     folic acid 1 MG tablet  Commonly known as:  FOLVITE  Take 1 tablet (1 mg total) by mouth daily.     furosemide 40 MG tablet  Commonly known as:  LASIX  Take 40 mg by mouth 2 (two) times daily.     HYDROcodone-acetaminophen 5-500 MG per tablet  Commonly known as:  VICODIN  Take 1 tablet by mouth every 6 (six) hours as needed. For pain     loratadine 10 MG tablet  Commonly known as:  CLARITIN  Take 10 mg by mouth daily. For allergies     multivitamin with minerals Tabs tablet  Take 1 tablet by mouth daily.     oxyCODONE-acetaminophen  5-325 MG per tablet  Commonly known as:  ROXICET  Take 1-2 tablets by mouth every 4 (four) hours as needed for pain.     sodium bicarbonate 650 MG tablet  Take 650 mg by mouth 3 (three) times daily.     thiamine 100 MG tablet  Take 1 tablet (100 mg total) by mouth daily.          Diet and Activity recommendation: See Discharge Instructions above   Consults obtained - cardiology, renal   Major procedures and Radiology Reports - PLEASE review  detailed and final reports for all details, in brief -   Echogram  - Left ventricle: Systolic function was mildly to moderately reduced. The estimated ejection fraction was in the range of 40% to 45%. Hypokinesis of the mid-apicalanteroseptal, anterior, and apical myocardium. Due to tachycardia, there was fusion of early and atrial contributions to ventricular filling. - Mitral valve: There was mild regurgitation. - Pulmonary arteries: Systolic pressure was mildly increased. PA peak pressure: 45 mm Hg (S).    Left heart cath  POST-OPERATIVE DIAGNOSIS:  Mild, diffuse coronary disease but no obstructive lesions to explain positive Troponins for the anterior wall motion abnormality noted on echo.  Relatively normal LVEDP of 10 mm Hg   PLAN OF CARE:  The patient will be transferred to the post procedural unit for monitoring of the groin post sheath removal. Anticipate that she will undergo dialysis either tonight or tomorrow.  Would continue aggressive risk factor modification and treatment of her cardiomyopathy the does not appear to be ischemic in nature. Continue aggressive blood pressure control.   Leonie Man, M.D., M.S.  [facility]  [address] [place], [place] [address]  [phone]  08/26/2013  6:18 PM    Dg Chest Port 1 View  08/25/2013   CLINICAL DATA:  Severe shortness of breath.  EXAM: PORTABLE CHEST - 1 VIEW  COMPARISON:  03/11/2012  FINDINGS: The patient has bilateral pulmonary edema. There is cardiomegaly. Pulmonary vascularity is within normal limits. No discrete effusions.  No acute osseous abnormality.  IMPRESSION: Bilateral hazy pulmonary infiltrates consistent with pulmonary edema.   Electronically Signed   By: Rozetta Nunnery M.D.   On: 08/25/2013 15:52    Micro Results      Recent Results (from the past 240 hour(s))  MRSA PCR SCREENING     Status: None   Collection Time    08/25/13 10:58 PM      Result Value Ref  Range Status   MRSA by PCR NEGATIVE  NEGATIVE Final   Comment:            The GeneXpert MRSA Assay (FDA     approved for NASAL specimens     only), is one component of a     comprehensive MRSA colonization     surveillance program. It is not     intended to diagnose MRSA     infection nor to guide or     monitor treatment for     MRSA infections.       Today   Subjective:   Jessica Clarke today has no headache,no chest abdominal pain,no new weakness tingling or numbness, feels much better wants to go home today.    Objective:   Blood pressure 131/80, pulse 69, temperature 98.5 F (36.9 C), temperature source Oral, resp. rate 20, height 5\' 11"  (1.803 m), weight 78.4 kg (172 lb 13.5 oz), SpO2 97.00%.   Intake/Output Summary (Last  24 hours) at 08/27/13 1117 Last data filed at 08/27/13 0542  Gross per 24 hour  Intake    590 ml  Output    300 ml  Net    290 ml    Exam Awake Alert, Oriented x 3, No new F.N deficits, Normal affect [place].AT,PERRAL Supple Neck,No JVD, No cervical lymphadenopathy appriciated.  Symmetrical Chest wall movement, Good air movement bilaterally, CTAB RRR,No Gallops,Rubs or new Murmurs, No Parasternal Heave +ve B.Sounds, Abd Soft, Non tender, No organomegaly appriciated, No rebound -guarding or rigidity. No Cyanosis, Clubbing or edema, No new Rash or bruise  Data Review   CBC w Diff: Lab Results  Component Value Date   WBC 6.3 08/27/2013   HGB 8.9* 08/27/2013   HCT 28.8* 08/27/2013   PLT 132* 08/27/2013   LYMPHOPCT 32 08/25/2013   MONOPCT 9 08/25/2013   EOSPCT 2 08/25/2013   BASOPCT 0 08/25/2013    CMP: Lab Results  Component Value Date   NA 137 08/27/2013   K 3.3* 08/27/2013   CL 95* 08/27/2013   CO2 28 08/27/2013   BUN 16 08/27/2013   CREATININE 4.32* 08/27/2013   PROT 6.7 08/26/2013   ALBUMIN 2.8* 08/26/2013   BILITOT 0.4 08/26/2013   ALKPHOS 63 08/26/2013   AST 14 08/26/2013   ALT <5 08/26/2013  .   Total Time in preparing paper work, data  evaluation and todays exam - 35 minutes  Thurnell Lose M.D on 08/27/2013 at 11:17 AM  [place]  [phone]   **Disclaimer: This note may have been dictated with voice recognition software. Similar sounding words can inadvertently be transcribed and this note may contain transcription errors which may not have been corrected upon publication of note.**

## 2013-08-27 NOTE — Procedures (Signed)
I was present at this dialysis session, have reviewed the session itself and made  appropriate changes  Doing much better, on HD, no signs of fluid overload at this time. Have d/w primary MD, ok for dc after HD from our standpoint. Will need lower dry wt at d/c.   Kelly Splinter MD (pgr) [phone]    ([phone] 08/27/2013, 1:16 PM

## 2013-08-27 NOTE — Progress Notes (Signed)
Pt's urine culture was sent down early this morning.  Results showing many bacteria. Negative leukocytes.

## 2013-08-28 LAB — URINE CULTURE

## 2013-09-08 ENCOUNTER — Ambulatory Visit (INDEPENDENT_AMBULATORY_CARE_PROVIDER_SITE_OTHER): Payer: Medicare Other | Admitting: Interventional Cardiology

## 2013-09-08 ENCOUNTER — Encounter: Payer: Self-pay | Admitting: Interventional Cardiology

## 2013-09-08 VITALS — BP 141/80 | HR 80 | Ht 71.0 in | Wt 171.0 lb

## 2013-09-08 DIAGNOSIS — I214 Non-ST elevation (NSTEMI) myocardial infarction: Secondary | ICD-10-CM

## 2013-09-08 DIAGNOSIS — N186 End stage renal disease: Secondary | ICD-10-CM

## 2013-09-08 DIAGNOSIS — I5042 Chronic combined systolic (congestive) and diastolic (congestive) heart failure: Secondary | ICD-10-CM

## 2013-09-08 DIAGNOSIS — I10 Essential (primary) hypertension: Secondary | ICD-10-CM

## 2013-09-08 NOTE — Patient Instructions (Signed)
Your physician recommends that you continue on your current medications as directed. Please refer to the Current Medication list given to you today.  Monitor your fluid intake  Your physician recommends that you schedule a follow-up appointment as needed

## 2013-09-08 NOTE — Progress Notes (Signed)
Patient ID: Jessica Clarke, female   DOB: 1950-11-04, 63 y.o.   MRN: [id number]    [address] [place], [place]  [address] Phone: [phone] Fax:  [phone]  Date:  09/08/2013   [name], DOB 1950/04/22, MRN [id number]  PCP:  Donetta Potts, MD   ASSESSMENT:  1. Chronic systolic and diastolic heart failure, clinically stable 2. End-stage renal disease, on dialysis therapy 3. Hypertension, controlled today. 4. Alcohol abuse  PLAN:  1. encourage to discontinue alcohol intake 2. Educated concerning volume intake and relationship to dialysis fluid removal 3. Clinical followup with cardiology as needed   SUBJECTIVE: Jessica Clarke is a 63 y.o. female who is doing well. She denies dyspnea, chest pain, and swelling. She is on Monday Wednesday Friday dialysis schedule. There is no edema. She denies chest pain. Cardiac catheterization did not demonstrate any significant coronary disease. She has been compliant with her medications. She still drinks beer each night.   Wt Readings from Last 3 Encounters:  09/08/13 171 lb (77.565 kg)  08/27/13 163 lb 12.8 oz (74.3 kg)  08/27/13 163 lb 12.8 oz (74.3 kg)     Past Medical History  Diagnosis Date  . Obesity   . Uterine polyp   . Cervical dysplasia   . Hypertension   . Chronic kidney disease   . Constipation   . Gout   . Constipation   . Abnormal Pap smear     ASCUS ?HGSIL    Current Outpatient Prescriptions  Medication Sig Dispense Refill  . allopurinol (ZYLOPRIM) 100 MG tablet Take 100 mg by mouth daily.        Marland Kitchen aspirin EC 325 MG tablet Take 1 tablet (325 mg total) by mouth daily.  30 tablet  0  . atenolol (TENORMIN) 100 MG tablet Take 1 tablet (100 mg total) by mouth daily.  60 tablet  0  . atorvastatin (LIPITOR) 10 MG tablet Take 1 tablet (10 mg total) by mouth daily. Can use any approved statin same dose  30 tablet  0  . calcitRIOL (ROCALTROL) 0.25 MCG capsule Take 1 capsule by mouth daily.       . calcium acetate (PHOSLO) 667 MG capsule Take by mouth 3 (three) times daily with meals.      . cloNIDine (CATAPRES) 0.2 MG tablet Take 1 tablet (0.2 mg total) by mouth 3 (three) times daily.  90 tablet  0  . folic acid (FOLVITE) 1 MG tablet Take 1 tablet (1 mg total) by mouth daily.  30 tablet  0  . furosemide (LASIX) 40 MG tablet Take 40 mg by mouth 2 (two) times daily.        . Multiple Vitamin (MULTIVITAMIN WITH MINERALS) TABS tablet Take 1 tablet by mouth daily.  30 tablet  0  . sodium bicarbonate 650 MG tablet Take 650 mg by mouth 3 (three) times daily.      Marland Kitchen thiamine 100 MG tablet Take 1 tablet (100 mg total) by mouth daily.  30 tablet  0   Current Facility-Administered Medications  Medication Dose Route Frequency Provider Last Rate Last Dose  . 0.9 %  sodium chloride infusion  500 mL Intravenous Continuous Inda Castle, MD        Allergies:   No Known Allergies  Social History:  The patient  reports that she has been smoking Cigarettes.  She has a 30 pack-year smoking history. She has never used smokeless tobacco. She reports that she drinks  about 3.6 ounces of alcohol per week. She reports that she does not use illicit drugs.   ROS:  Please see the history of present illness.   Denies headache. Has not fainted.   All other systems reviewed and negative.   OBJECTIVE: VS:  BP 141/80  Pulse 80  Ht 5\' 11"  (1.803 m)  Wt 171 lb (77.565 kg)  BMI 23.86 kg/m2 Well nourished, well developed, in no acute distress, African American in no distress HEENT: normal Neck: JVD flat. Carotid bruit absent  Cardiac:  normal S1, S2; RRR; no murmur Lungs:  clear to auscultation bilaterally, no wheezing, rhonchi or rales Abd: soft, nontender, no hepatomegaly Ext: Edema none. Pulses 2+ Skin: warm and dry Neuro:  CNs 2-12 intact, no focal abnormalities noted  EKG:  Not performed       Signed, Illene Labrador III, MD 09/08/2013 8:14 AM

## 2013-12-15 ENCOUNTER — Encounter: Payer: Self-pay | Admitting: Interventional Cardiology

## 2014-01-22 ENCOUNTER — Encounter (HOSPITAL_COMMUNITY): Payer: Self-pay | Admitting: Cardiology

## 2014-02-14 DIAGNOSIS — D631 Anemia in chronic kidney disease: Secondary | ICD-10-CM | POA: Diagnosis not present

## 2014-02-14 DIAGNOSIS — D689 Coagulation defect, unspecified: Secondary | ICD-10-CM | POA: Diagnosis not present

## 2014-02-14 DIAGNOSIS — D509 Iron deficiency anemia, unspecified: Secondary | ICD-10-CM | POA: Diagnosis not present

## 2014-02-14 DIAGNOSIS — N186 End stage renal disease: Secondary | ICD-10-CM | POA: Diagnosis not present

## 2014-02-16 DIAGNOSIS — N186 End stage renal disease: Secondary | ICD-10-CM | POA: Diagnosis not present

## 2014-02-16 DIAGNOSIS — D631 Anemia in chronic kidney disease: Secondary | ICD-10-CM | POA: Diagnosis not present

## 2014-02-16 DIAGNOSIS — D689 Coagulation defect, unspecified: Secondary | ICD-10-CM | POA: Diagnosis not present

## 2014-02-16 DIAGNOSIS — D509 Iron deficiency anemia, unspecified: Secondary | ICD-10-CM | POA: Diagnosis not present

## 2014-02-18 DIAGNOSIS — D509 Iron deficiency anemia, unspecified: Secondary | ICD-10-CM | POA: Diagnosis not present

## 2014-02-18 DIAGNOSIS — D689 Coagulation defect, unspecified: Secondary | ICD-10-CM | POA: Diagnosis not present

## 2014-02-18 DIAGNOSIS — N186 End stage renal disease: Secondary | ICD-10-CM | POA: Diagnosis not present

## 2014-02-18 DIAGNOSIS — D631 Anemia in chronic kidney disease: Secondary | ICD-10-CM | POA: Diagnosis not present

## 2014-02-20 DIAGNOSIS — D631 Anemia in chronic kidney disease: Secondary | ICD-10-CM | POA: Diagnosis not present

## 2014-02-20 DIAGNOSIS — D509 Iron deficiency anemia, unspecified: Secondary | ICD-10-CM | POA: Diagnosis not present

## 2014-02-20 DIAGNOSIS — N186 End stage renal disease: Secondary | ICD-10-CM | POA: Diagnosis not present

## 2014-02-20 DIAGNOSIS — D689 Coagulation defect, unspecified: Secondary | ICD-10-CM | POA: Diagnosis not present

## 2014-02-23 DIAGNOSIS — D689 Coagulation defect, unspecified: Secondary | ICD-10-CM | POA: Diagnosis not present

## 2014-02-23 DIAGNOSIS — N186 End stage renal disease: Secondary | ICD-10-CM | POA: Diagnosis not present

## 2014-02-23 DIAGNOSIS — D509 Iron deficiency anemia, unspecified: Secondary | ICD-10-CM | POA: Diagnosis not present

## 2014-02-23 DIAGNOSIS — D631 Anemia in chronic kidney disease: Secondary | ICD-10-CM | POA: Diagnosis not present

## 2014-02-27 DIAGNOSIS — D509 Iron deficiency anemia, unspecified: Secondary | ICD-10-CM | POA: Diagnosis not present

## 2014-02-27 DIAGNOSIS — D689 Coagulation defect, unspecified: Secondary | ICD-10-CM | POA: Diagnosis not present

## 2014-02-27 DIAGNOSIS — N186 End stage renal disease: Secondary | ICD-10-CM | POA: Diagnosis not present

## 2014-02-27 DIAGNOSIS — D631 Anemia in chronic kidney disease: Secondary | ICD-10-CM | POA: Diagnosis not present

## 2014-03-02 DIAGNOSIS — N186 End stage renal disease: Secondary | ICD-10-CM | POA: Diagnosis not present

## 2014-03-02 DIAGNOSIS — D631 Anemia in chronic kidney disease: Secondary | ICD-10-CM | POA: Diagnosis not present

## 2014-03-02 DIAGNOSIS — D689 Coagulation defect, unspecified: Secondary | ICD-10-CM | POA: Diagnosis not present

## 2014-03-02 DIAGNOSIS — D509 Iron deficiency anemia, unspecified: Secondary | ICD-10-CM | POA: Diagnosis not present

## 2014-03-04 DIAGNOSIS — D509 Iron deficiency anemia, unspecified: Secondary | ICD-10-CM | POA: Diagnosis not present

## 2014-03-04 DIAGNOSIS — N186 End stage renal disease: Secondary | ICD-10-CM | POA: Diagnosis not present

## 2014-03-04 DIAGNOSIS — D631 Anemia in chronic kidney disease: Secondary | ICD-10-CM | POA: Diagnosis not present

## 2014-03-04 DIAGNOSIS — D689 Coagulation defect, unspecified: Secondary | ICD-10-CM | POA: Diagnosis not present

## 2014-03-09 DIAGNOSIS — N186 End stage renal disease: Secondary | ICD-10-CM | POA: Diagnosis not present

## 2014-03-09 DIAGNOSIS — D631 Anemia in chronic kidney disease: Secondary | ICD-10-CM | POA: Diagnosis not present

## 2014-03-09 DIAGNOSIS — D689 Coagulation defect, unspecified: Secondary | ICD-10-CM | POA: Diagnosis not present

## 2014-03-09 DIAGNOSIS — D509 Iron deficiency anemia, unspecified: Secondary | ICD-10-CM | POA: Diagnosis not present

## 2014-03-11 DIAGNOSIS — N186 End stage renal disease: Secondary | ICD-10-CM | POA: Diagnosis not present

## 2014-03-11 DIAGNOSIS — D509 Iron deficiency anemia, unspecified: Secondary | ICD-10-CM | POA: Diagnosis not present

## 2014-03-11 DIAGNOSIS — D689 Coagulation defect, unspecified: Secondary | ICD-10-CM | POA: Diagnosis not present

## 2014-03-11 DIAGNOSIS — D631 Anemia in chronic kidney disease: Secondary | ICD-10-CM | POA: Diagnosis not present

## 2014-03-13 DIAGNOSIS — D509 Iron deficiency anemia, unspecified: Secondary | ICD-10-CM | POA: Diagnosis not present

## 2014-03-13 DIAGNOSIS — D631 Anemia in chronic kidney disease: Secondary | ICD-10-CM | POA: Diagnosis not present

## 2014-03-13 DIAGNOSIS — D689 Coagulation defect, unspecified: Secondary | ICD-10-CM | POA: Diagnosis not present

## 2014-03-13 DIAGNOSIS — N186 End stage renal disease: Secondary | ICD-10-CM | POA: Diagnosis not present

## 2014-03-15 DIAGNOSIS — N186 End stage renal disease: Secondary | ICD-10-CM | POA: Diagnosis not present

## 2014-03-15 DIAGNOSIS — Z992 Dependence on renal dialysis: Secondary | ICD-10-CM | POA: Diagnosis not present

## 2014-03-16 DIAGNOSIS — D689 Coagulation defect, unspecified: Secondary | ICD-10-CM | POA: Diagnosis not present

## 2014-03-16 DIAGNOSIS — D631 Anemia in chronic kidney disease: Secondary | ICD-10-CM | POA: Diagnosis not present

## 2014-03-16 DIAGNOSIS — N186 End stage renal disease: Secondary | ICD-10-CM | POA: Diagnosis not present

## 2014-03-16 DIAGNOSIS — D509 Iron deficiency anemia, unspecified: Secondary | ICD-10-CM | POA: Diagnosis not present

## 2014-03-18 DIAGNOSIS — D631 Anemia in chronic kidney disease: Secondary | ICD-10-CM | POA: Diagnosis not present

## 2014-03-18 DIAGNOSIS — N186 End stage renal disease: Secondary | ICD-10-CM | POA: Diagnosis not present

## 2014-03-18 DIAGNOSIS — D509 Iron deficiency anemia, unspecified: Secondary | ICD-10-CM | POA: Diagnosis not present

## 2014-03-18 DIAGNOSIS — D689 Coagulation defect, unspecified: Secondary | ICD-10-CM | POA: Diagnosis not present

## 2014-03-20 DIAGNOSIS — D631 Anemia in chronic kidney disease: Secondary | ICD-10-CM | POA: Diagnosis not present

## 2014-03-20 DIAGNOSIS — D689 Coagulation defect, unspecified: Secondary | ICD-10-CM | POA: Diagnosis not present

## 2014-03-20 DIAGNOSIS — D509 Iron deficiency anemia, unspecified: Secondary | ICD-10-CM | POA: Diagnosis not present

## 2014-03-20 DIAGNOSIS — N186 End stage renal disease: Secondary | ICD-10-CM | POA: Diagnosis not present

## 2014-03-23 DIAGNOSIS — N186 End stage renal disease: Secondary | ICD-10-CM | POA: Diagnosis not present

## 2014-03-23 DIAGNOSIS — D509 Iron deficiency anemia, unspecified: Secondary | ICD-10-CM | POA: Diagnosis not present

## 2014-03-23 DIAGNOSIS — D689 Coagulation defect, unspecified: Secondary | ICD-10-CM | POA: Diagnosis not present

## 2014-03-23 DIAGNOSIS — D631 Anemia in chronic kidney disease: Secondary | ICD-10-CM | POA: Diagnosis not present

## 2014-03-25 DIAGNOSIS — D509 Iron deficiency anemia, unspecified: Secondary | ICD-10-CM | POA: Diagnosis not present

## 2014-03-25 DIAGNOSIS — N186 End stage renal disease: Secondary | ICD-10-CM | POA: Diagnosis not present

## 2014-03-25 DIAGNOSIS — D689 Coagulation defect, unspecified: Secondary | ICD-10-CM | POA: Diagnosis not present

## 2014-03-25 DIAGNOSIS — D631 Anemia in chronic kidney disease: Secondary | ICD-10-CM | POA: Diagnosis not present

## 2014-03-27 DIAGNOSIS — D509 Iron deficiency anemia, unspecified: Secondary | ICD-10-CM | POA: Diagnosis not present

## 2014-03-27 DIAGNOSIS — N186 End stage renal disease: Secondary | ICD-10-CM | POA: Diagnosis not present

## 2014-03-27 DIAGNOSIS — D689 Coagulation defect, unspecified: Secondary | ICD-10-CM | POA: Diagnosis not present

## 2014-03-27 DIAGNOSIS — D631 Anemia in chronic kidney disease: Secondary | ICD-10-CM | POA: Diagnosis not present

## 2014-03-30 DIAGNOSIS — D689 Coagulation defect, unspecified: Secondary | ICD-10-CM | POA: Diagnosis not present

## 2014-03-30 DIAGNOSIS — D631 Anemia in chronic kidney disease: Secondary | ICD-10-CM | POA: Diagnosis not present

## 2014-03-30 DIAGNOSIS — N186 End stage renal disease: Secondary | ICD-10-CM | POA: Diagnosis not present

## 2014-03-30 DIAGNOSIS — D509 Iron deficiency anemia, unspecified: Secondary | ICD-10-CM | POA: Diagnosis not present

## 2014-04-01 DIAGNOSIS — D509 Iron deficiency anemia, unspecified: Secondary | ICD-10-CM | POA: Diagnosis not present

## 2014-04-01 DIAGNOSIS — N186 End stage renal disease: Secondary | ICD-10-CM | POA: Diagnosis not present

## 2014-04-01 DIAGNOSIS — D631 Anemia in chronic kidney disease: Secondary | ICD-10-CM | POA: Diagnosis not present

## 2014-04-01 DIAGNOSIS — D689 Coagulation defect, unspecified: Secondary | ICD-10-CM | POA: Diagnosis not present

## 2014-04-03 DIAGNOSIS — D689 Coagulation defect, unspecified: Secondary | ICD-10-CM | POA: Diagnosis not present

## 2014-04-03 DIAGNOSIS — D509 Iron deficiency anemia, unspecified: Secondary | ICD-10-CM | POA: Diagnosis not present

## 2014-04-03 DIAGNOSIS — D631 Anemia in chronic kidney disease: Secondary | ICD-10-CM | POA: Diagnosis not present

## 2014-04-03 DIAGNOSIS — N186 End stage renal disease: Secondary | ICD-10-CM | POA: Diagnosis not present

## 2014-04-06 DIAGNOSIS — D689 Coagulation defect, unspecified: Secondary | ICD-10-CM | POA: Diagnosis not present

## 2014-04-06 DIAGNOSIS — N186 End stage renal disease: Secondary | ICD-10-CM | POA: Diagnosis not present

## 2014-04-06 DIAGNOSIS — D509 Iron deficiency anemia, unspecified: Secondary | ICD-10-CM | POA: Diagnosis not present

## 2014-04-06 DIAGNOSIS — D631 Anemia in chronic kidney disease: Secondary | ICD-10-CM | POA: Diagnosis not present

## 2014-04-10 DIAGNOSIS — D689 Coagulation defect, unspecified: Secondary | ICD-10-CM | POA: Diagnosis not present

## 2014-04-10 DIAGNOSIS — D631 Anemia in chronic kidney disease: Secondary | ICD-10-CM | POA: Diagnosis not present

## 2014-04-10 DIAGNOSIS — N186 End stage renal disease: Secondary | ICD-10-CM | POA: Diagnosis not present

## 2014-04-10 DIAGNOSIS — D509 Iron deficiency anemia, unspecified: Secondary | ICD-10-CM | POA: Diagnosis not present

## 2014-04-13 DIAGNOSIS — D689 Coagulation defect, unspecified: Secondary | ICD-10-CM | POA: Diagnosis not present

## 2014-04-13 DIAGNOSIS — D631 Anemia in chronic kidney disease: Secondary | ICD-10-CM | POA: Diagnosis not present

## 2014-04-13 DIAGNOSIS — D509 Iron deficiency anemia, unspecified: Secondary | ICD-10-CM | POA: Diagnosis not present

## 2014-04-13 DIAGNOSIS — Z992 Dependence on renal dialysis: Secondary | ICD-10-CM | POA: Diagnosis not present

## 2014-04-13 DIAGNOSIS — N186 End stage renal disease: Secondary | ICD-10-CM | POA: Diagnosis not present

## 2014-04-15 DIAGNOSIS — N186 End stage renal disease: Secondary | ICD-10-CM | POA: Diagnosis not present

## 2014-04-15 DIAGNOSIS — D689 Coagulation defect, unspecified: Secondary | ICD-10-CM | POA: Diagnosis not present

## 2014-04-15 DIAGNOSIS — D509 Iron deficiency anemia, unspecified: Secondary | ICD-10-CM | POA: Diagnosis not present

## 2014-04-15 DIAGNOSIS — D631 Anemia in chronic kidney disease: Secondary | ICD-10-CM | POA: Diagnosis not present

## 2014-04-17 DIAGNOSIS — N186 End stage renal disease: Secondary | ICD-10-CM | POA: Diagnosis not present

## 2014-04-17 DIAGNOSIS — D509 Iron deficiency anemia, unspecified: Secondary | ICD-10-CM | POA: Diagnosis not present

## 2014-04-17 DIAGNOSIS — D631 Anemia in chronic kidney disease: Secondary | ICD-10-CM | POA: Diagnosis not present

## 2014-04-17 DIAGNOSIS — D689 Coagulation defect, unspecified: Secondary | ICD-10-CM | POA: Diagnosis not present

## 2014-04-20 DIAGNOSIS — D631 Anemia in chronic kidney disease: Secondary | ICD-10-CM | POA: Diagnosis not present

## 2014-04-20 DIAGNOSIS — D689 Coagulation defect, unspecified: Secondary | ICD-10-CM | POA: Diagnosis not present

## 2014-04-20 DIAGNOSIS — N186 End stage renal disease: Secondary | ICD-10-CM | POA: Diagnosis not present

## 2014-04-20 DIAGNOSIS — D509 Iron deficiency anemia, unspecified: Secondary | ICD-10-CM | POA: Diagnosis not present

## 2014-04-22 DIAGNOSIS — D509 Iron deficiency anemia, unspecified: Secondary | ICD-10-CM | POA: Diagnosis not present

## 2014-04-22 DIAGNOSIS — D631 Anemia in chronic kidney disease: Secondary | ICD-10-CM | POA: Diagnosis not present

## 2014-04-22 DIAGNOSIS — N186 End stage renal disease: Secondary | ICD-10-CM | POA: Diagnosis not present

## 2014-04-22 DIAGNOSIS — D689 Coagulation defect, unspecified: Secondary | ICD-10-CM | POA: Diagnosis not present

## 2014-04-24 DIAGNOSIS — D509 Iron deficiency anemia, unspecified: Secondary | ICD-10-CM | POA: Diagnosis not present

## 2014-04-24 DIAGNOSIS — N186 End stage renal disease: Secondary | ICD-10-CM | POA: Diagnosis not present

## 2014-04-24 DIAGNOSIS — D631 Anemia in chronic kidney disease: Secondary | ICD-10-CM | POA: Diagnosis not present

## 2014-04-24 DIAGNOSIS — D689 Coagulation defect, unspecified: Secondary | ICD-10-CM | POA: Diagnosis not present

## 2014-04-27 DIAGNOSIS — D689 Coagulation defect, unspecified: Secondary | ICD-10-CM | POA: Diagnosis not present

## 2014-04-27 DIAGNOSIS — N186 End stage renal disease: Secondary | ICD-10-CM | POA: Diagnosis not present

## 2014-04-27 DIAGNOSIS — D631 Anemia in chronic kidney disease: Secondary | ICD-10-CM | POA: Diagnosis not present

## 2014-04-27 DIAGNOSIS — D509 Iron deficiency anemia, unspecified: Secondary | ICD-10-CM | POA: Diagnosis not present

## 2014-04-29 DIAGNOSIS — D689 Coagulation defect, unspecified: Secondary | ICD-10-CM | POA: Diagnosis not present

## 2014-04-29 DIAGNOSIS — D509 Iron deficiency anemia, unspecified: Secondary | ICD-10-CM | POA: Diagnosis not present

## 2014-04-29 DIAGNOSIS — D631 Anemia in chronic kidney disease: Secondary | ICD-10-CM | POA: Diagnosis not present

## 2014-04-29 DIAGNOSIS — N186 End stage renal disease: Secondary | ICD-10-CM | POA: Diagnosis not present

## 2014-05-01 DIAGNOSIS — D509 Iron deficiency anemia, unspecified: Secondary | ICD-10-CM | POA: Diagnosis not present

## 2014-05-01 DIAGNOSIS — D689 Coagulation defect, unspecified: Secondary | ICD-10-CM | POA: Diagnosis not present

## 2014-05-01 DIAGNOSIS — D631 Anemia in chronic kidney disease: Secondary | ICD-10-CM | POA: Diagnosis not present

## 2014-05-01 DIAGNOSIS — N186 End stage renal disease: Secondary | ICD-10-CM | POA: Diagnosis not present

## 2014-05-04 DIAGNOSIS — D509 Iron deficiency anemia, unspecified: Secondary | ICD-10-CM | POA: Diagnosis not present

## 2014-05-04 DIAGNOSIS — N186 End stage renal disease: Secondary | ICD-10-CM | POA: Diagnosis not present

## 2014-05-04 DIAGNOSIS — D689 Coagulation defect, unspecified: Secondary | ICD-10-CM | POA: Diagnosis not present

## 2014-05-04 DIAGNOSIS — D631 Anemia in chronic kidney disease: Secondary | ICD-10-CM | POA: Diagnosis not present

## 2014-05-06 DIAGNOSIS — D509 Iron deficiency anemia, unspecified: Secondary | ICD-10-CM | POA: Diagnosis not present

## 2014-05-06 DIAGNOSIS — D631 Anemia in chronic kidney disease: Secondary | ICD-10-CM | POA: Diagnosis not present

## 2014-05-06 DIAGNOSIS — N186 End stage renal disease: Secondary | ICD-10-CM | POA: Diagnosis not present

## 2014-05-06 DIAGNOSIS — D689 Coagulation defect, unspecified: Secondary | ICD-10-CM | POA: Diagnosis not present

## 2014-05-08 DIAGNOSIS — N186 End stage renal disease: Secondary | ICD-10-CM | POA: Diagnosis not present

## 2014-05-08 DIAGNOSIS — D509 Iron deficiency anemia, unspecified: Secondary | ICD-10-CM | POA: Diagnosis not present

## 2014-05-08 DIAGNOSIS — D631 Anemia in chronic kidney disease: Secondary | ICD-10-CM | POA: Diagnosis not present

## 2014-05-08 DIAGNOSIS — D689 Coagulation defect, unspecified: Secondary | ICD-10-CM | POA: Diagnosis not present

## 2014-05-11 DIAGNOSIS — D689 Coagulation defect, unspecified: Secondary | ICD-10-CM | POA: Diagnosis not present

## 2014-05-11 DIAGNOSIS — N186 End stage renal disease: Secondary | ICD-10-CM | POA: Diagnosis not present

## 2014-05-11 DIAGNOSIS — D631 Anemia in chronic kidney disease: Secondary | ICD-10-CM | POA: Diagnosis not present

## 2014-05-11 DIAGNOSIS — D509 Iron deficiency anemia, unspecified: Secondary | ICD-10-CM | POA: Diagnosis not present

## 2014-05-13 DIAGNOSIS — D631 Anemia in chronic kidney disease: Secondary | ICD-10-CM | POA: Diagnosis not present

## 2014-05-13 DIAGNOSIS — D689 Coagulation defect, unspecified: Secondary | ICD-10-CM | POA: Diagnosis not present

## 2014-05-13 DIAGNOSIS — D509 Iron deficiency anemia, unspecified: Secondary | ICD-10-CM | POA: Diagnosis not present

## 2014-05-13 DIAGNOSIS — N186 End stage renal disease: Secondary | ICD-10-CM | POA: Diagnosis not present

## 2014-05-14 DIAGNOSIS — Z992 Dependence on renal dialysis: Secondary | ICD-10-CM | POA: Diagnosis not present

## 2014-05-14 DIAGNOSIS — N186 End stage renal disease: Secondary | ICD-10-CM | POA: Diagnosis not present

## 2014-05-14 DIAGNOSIS — N2889 Other specified disorders of kidney and ureter: Secondary | ICD-10-CM | POA: Diagnosis not present

## 2014-05-15 DIAGNOSIS — Z23 Encounter for immunization: Secondary | ICD-10-CM | POA: Diagnosis not present

## 2014-05-15 DIAGNOSIS — N186 End stage renal disease: Secondary | ICD-10-CM | POA: Diagnosis not present

## 2014-05-15 DIAGNOSIS — D509 Iron deficiency anemia, unspecified: Secondary | ICD-10-CM | POA: Diagnosis not present

## 2014-05-15 DIAGNOSIS — D689 Coagulation defect, unspecified: Secondary | ICD-10-CM | POA: Diagnosis not present

## 2014-05-18 DIAGNOSIS — N186 End stage renal disease: Secondary | ICD-10-CM | POA: Diagnosis not present

## 2014-05-18 DIAGNOSIS — D689 Coagulation defect, unspecified: Secondary | ICD-10-CM | POA: Diagnosis not present

## 2014-05-18 DIAGNOSIS — D509 Iron deficiency anemia, unspecified: Secondary | ICD-10-CM | POA: Diagnosis not present

## 2014-05-18 DIAGNOSIS — Z23 Encounter for immunization: Secondary | ICD-10-CM | POA: Diagnosis not present

## 2014-05-20 DIAGNOSIS — N186 End stage renal disease: Secondary | ICD-10-CM | POA: Diagnosis not present

## 2014-05-20 DIAGNOSIS — D689 Coagulation defect, unspecified: Secondary | ICD-10-CM | POA: Diagnosis not present

## 2014-05-20 DIAGNOSIS — D509 Iron deficiency anemia, unspecified: Secondary | ICD-10-CM | POA: Diagnosis not present

## 2014-05-20 DIAGNOSIS — Z23 Encounter for immunization: Secondary | ICD-10-CM | POA: Diagnosis not present

## 2014-05-22 DIAGNOSIS — N186 End stage renal disease: Secondary | ICD-10-CM | POA: Diagnosis not present

## 2014-05-22 DIAGNOSIS — Z23 Encounter for immunization: Secondary | ICD-10-CM | POA: Diagnosis not present

## 2014-05-22 DIAGNOSIS — D509 Iron deficiency anemia, unspecified: Secondary | ICD-10-CM | POA: Diagnosis not present

## 2014-05-22 DIAGNOSIS — D689 Coagulation defect, unspecified: Secondary | ICD-10-CM | POA: Diagnosis not present

## 2014-05-25 DIAGNOSIS — D689 Coagulation defect, unspecified: Secondary | ICD-10-CM | POA: Diagnosis not present

## 2014-05-25 DIAGNOSIS — D509 Iron deficiency anemia, unspecified: Secondary | ICD-10-CM | POA: Diagnosis not present

## 2014-05-25 DIAGNOSIS — Z23 Encounter for immunization: Secondary | ICD-10-CM | POA: Diagnosis not present

## 2014-05-25 DIAGNOSIS — N186 End stage renal disease: Secondary | ICD-10-CM | POA: Diagnosis not present

## 2014-05-27 DIAGNOSIS — D689 Coagulation defect, unspecified: Secondary | ICD-10-CM | POA: Diagnosis not present

## 2014-05-27 DIAGNOSIS — N186 End stage renal disease: Secondary | ICD-10-CM | POA: Diagnosis not present

## 2014-05-27 DIAGNOSIS — D509 Iron deficiency anemia, unspecified: Secondary | ICD-10-CM | POA: Diagnosis not present

## 2014-05-27 DIAGNOSIS — Z23 Encounter for immunization: Secondary | ICD-10-CM | POA: Diagnosis not present

## 2014-05-29 DIAGNOSIS — D509 Iron deficiency anemia, unspecified: Secondary | ICD-10-CM | POA: Diagnosis not present

## 2014-05-29 DIAGNOSIS — Z23 Encounter for immunization: Secondary | ICD-10-CM | POA: Diagnosis not present

## 2014-05-29 DIAGNOSIS — D689 Coagulation defect, unspecified: Secondary | ICD-10-CM | POA: Diagnosis not present

## 2014-05-29 DIAGNOSIS — N186 End stage renal disease: Secondary | ICD-10-CM | POA: Diagnosis not present

## 2014-06-01 DIAGNOSIS — D689 Coagulation defect, unspecified: Secondary | ICD-10-CM | POA: Diagnosis not present

## 2014-06-01 DIAGNOSIS — N186 End stage renal disease: Secondary | ICD-10-CM | POA: Diagnosis not present

## 2014-06-01 DIAGNOSIS — Z23 Encounter for immunization: Secondary | ICD-10-CM | POA: Diagnosis not present

## 2014-06-01 DIAGNOSIS — D509 Iron deficiency anemia, unspecified: Secondary | ICD-10-CM | POA: Diagnosis not present

## 2014-06-03 DIAGNOSIS — N186 End stage renal disease: Secondary | ICD-10-CM | POA: Diagnosis not present

## 2014-06-03 DIAGNOSIS — Z23 Encounter for immunization: Secondary | ICD-10-CM | POA: Diagnosis not present

## 2014-06-03 DIAGNOSIS — D689 Coagulation defect, unspecified: Secondary | ICD-10-CM | POA: Diagnosis not present

## 2014-06-03 DIAGNOSIS — D509 Iron deficiency anemia, unspecified: Secondary | ICD-10-CM | POA: Diagnosis not present

## 2014-06-05 DIAGNOSIS — D689 Coagulation defect, unspecified: Secondary | ICD-10-CM | POA: Diagnosis not present

## 2014-06-05 DIAGNOSIS — N186 End stage renal disease: Secondary | ICD-10-CM | POA: Diagnosis not present

## 2014-06-05 DIAGNOSIS — D509 Iron deficiency anemia, unspecified: Secondary | ICD-10-CM | POA: Diagnosis not present

## 2014-06-05 DIAGNOSIS — Z23 Encounter for immunization: Secondary | ICD-10-CM | POA: Diagnosis not present

## 2014-06-08 DIAGNOSIS — D509 Iron deficiency anemia, unspecified: Secondary | ICD-10-CM | POA: Diagnosis not present

## 2014-06-08 DIAGNOSIS — D689 Coagulation defect, unspecified: Secondary | ICD-10-CM | POA: Diagnosis not present

## 2014-06-08 DIAGNOSIS — Z23 Encounter for immunization: Secondary | ICD-10-CM | POA: Diagnosis not present

## 2014-06-08 DIAGNOSIS — N186 End stage renal disease: Secondary | ICD-10-CM | POA: Diagnosis not present

## 2014-06-10 DIAGNOSIS — N186 End stage renal disease: Secondary | ICD-10-CM | POA: Diagnosis not present

## 2014-06-10 DIAGNOSIS — D509 Iron deficiency anemia, unspecified: Secondary | ICD-10-CM | POA: Diagnosis not present

## 2014-06-10 DIAGNOSIS — Z23 Encounter for immunization: Secondary | ICD-10-CM | POA: Diagnosis not present

## 2014-06-10 DIAGNOSIS — D689 Coagulation defect, unspecified: Secondary | ICD-10-CM | POA: Diagnosis not present

## 2014-06-12 DIAGNOSIS — N186 End stage renal disease: Secondary | ICD-10-CM | POA: Diagnosis not present

## 2014-06-12 DIAGNOSIS — D509 Iron deficiency anemia, unspecified: Secondary | ICD-10-CM | POA: Diagnosis not present

## 2014-06-12 DIAGNOSIS — Z23 Encounter for immunization: Secondary | ICD-10-CM | POA: Diagnosis not present

## 2014-06-12 DIAGNOSIS — D689 Coagulation defect, unspecified: Secondary | ICD-10-CM | POA: Diagnosis not present

## 2014-06-13 DIAGNOSIS — N2889 Other specified disorders of kidney and ureter: Secondary | ICD-10-CM | POA: Diagnosis not present

## 2014-06-13 DIAGNOSIS — Z992 Dependence on renal dialysis: Secondary | ICD-10-CM | POA: Diagnosis not present

## 2014-06-13 DIAGNOSIS — N186 End stage renal disease: Secondary | ICD-10-CM | POA: Diagnosis not present

## 2014-06-15 DIAGNOSIS — D689 Coagulation defect, unspecified: Secondary | ICD-10-CM | POA: Diagnosis not present

## 2014-06-15 DIAGNOSIS — D631 Anemia in chronic kidney disease: Secondary | ICD-10-CM | POA: Diagnosis not present

## 2014-06-15 DIAGNOSIS — D509 Iron deficiency anemia, unspecified: Secondary | ICD-10-CM | POA: Diagnosis not present

## 2014-06-15 DIAGNOSIS — N186 End stage renal disease: Secondary | ICD-10-CM | POA: Diagnosis not present

## 2014-06-17 DIAGNOSIS — D689 Coagulation defect, unspecified: Secondary | ICD-10-CM | POA: Diagnosis not present

## 2014-06-17 DIAGNOSIS — D631 Anemia in chronic kidney disease: Secondary | ICD-10-CM | POA: Diagnosis not present

## 2014-06-17 DIAGNOSIS — N186 End stage renal disease: Secondary | ICD-10-CM | POA: Diagnosis not present

## 2014-06-17 DIAGNOSIS — D509 Iron deficiency anemia, unspecified: Secondary | ICD-10-CM | POA: Diagnosis not present

## 2014-06-19 DIAGNOSIS — N186 End stage renal disease: Secondary | ICD-10-CM | POA: Diagnosis not present

## 2014-06-19 DIAGNOSIS — D689 Coagulation defect, unspecified: Secondary | ICD-10-CM | POA: Diagnosis not present

## 2014-06-19 DIAGNOSIS — D631 Anemia in chronic kidney disease: Secondary | ICD-10-CM | POA: Diagnosis not present

## 2014-06-19 DIAGNOSIS — D509 Iron deficiency anemia, unspecified: Secondary | ICD-10-CM | POA: Diagnosis not present

## 2014-06-22 DIAGNOSIS — D631 Anemia in chronic kidney disease: Secondary | ICD-10-CM | POA: Diagnosis not present

## 2014-06-22 DIAGNOSIS — D689 Coagulation defect, unspecified: Secondary | ICD-10-CM | POA: Diagnosis not present

## 2014-06-22 DIAGNOSIS — D509 Iron deficiency anemia, unspecified: Secondary | ICD-10-CM | POA: Diagnosis not present

## 2014-06-22 DIAGNOSIS — N186 End stage renal disease: Secondary | ICD-10-CM | POA: Diagnosis not present

## 2014-06-24 DIAGNOSIS — D631 Anemia in chronic kidney disease: Secondary | ICD-10-CM | POA: Diagnosis not present

## 2014-06-24 DIAGNOSIS — D689 Coagulation defect, unspecified: Secondary | ICD-10-CM | POA: Diagnosis not present

## 2014-06-24 DIAGNOSIS — N186 End stage renal disease: Secondary | ICD-10-CM | POA: Diagnosis not present

## 2014-06-24 DIAGNOSIS — D509 Iron deficiency anemia, unspecified: Secondary | ICD-10-CM | POA: Diagnosis not present

## 2014-06-26 DIAGNOSIS — D509 Iron deficiency anemia, unspecified: Secondary | ICD-10-CM | POA: Diagnosis not present

## 2014-06-26 DIAGNOSIS — D689 Coagulation defect, unspecified: Secondary | ICD-10-CM | POA: Diagnosis not present

## 2014-06-26 DIAGNOSIS — D631 Anemia in chronic kidney disease: Secondary | ICD-10-CM | POA: Diagnosis not present

## 2014-06-26 DIAGNOSIS — N186 End stage renal disease: Secondary | ICD-10-CM | POA: Diagnosis not present

## 2014-06-29 DIAGNOSIS — D689 Coagulation defect, unspecified: Secondary | ICD-10-CM | POA: Diagnosis not present

## 2014-06-29 DIAGNOSIS — D509 Iron deficiency anemia, unspecified: Secondary | ICD-10-CM | POA: Diagnosis not present

## 2014-06-29 DIAGNOSIS — D631 Anemia in chronic kidney disease: Secondary | ICD-10-CM | POA: Diagnosis not present

## 2014-06-29 DIAGNOSIS — N186 End stage renal disease: Secondary | ICD-10-CM | POA: Diagnosis not present

## 2014-07-01 DIAGNOSIS — D509 Iron deficiency anemia, unspecified: Secondary | ICD-10-CM | POA: Diagnosis not present

## 2014-07-01 DIAGNOSIS — N186 End stage renal disease: Secondary | ICD-10-CM | POA: Diagnosis not present

## 2014-07-01 DIAGNOSIS — D689 Coagulation defect, unspecified: Secondary | ICD-10-CM | POA: Diagnosis not present

## 2014-07-01 DIAGNOSIS — D631 Anemia in chronic kidney disease: Secondary | ICD-10-CM | POA: Diagnosis not present

## 2014-07-03 DIAGNOSIS — D689 Coagulation defect, unspecified: Secondary | ICD-10-CM | POA: Diagnosis not present

## 2014-07-03 DIAGNOSIS — D509 Iron deficiency anemia, unspecified: Secondary | ICD-10-CM | POA: Diagnosis not present

## 2014-07-03 DIAGNOSIS — D631 Anemia in chronic kidney disease: Secondary | ICD-10-CM | POA: Diagnosis not present

## 2014-07-03 DIAGNOSIS — N186 End stage renal disease: Secondary | ICD-10-CM | POA: Diagnosis not present

## 2014-07-06 DIAGNOSIS — D509 Iron deficiency anemia, unspecified: Secondary | ICD-10-CM | POA: Diagnosis not present

## 2014-07-06 DIAGNOSIS — D631 Anemia in chronic kidney disease: Secondary | ICD-10-CM | POA: Diagnosis not present

## 2014-07-06 DIAGNOSIS — D689 Coagulation defect, unspecified: Secondary | ICD-10-CM | POA: Diagnosis not present

## 2014-07-06 DIAGNOSIS — N186 End stage renal disease: Secondary | ICD-10-CM | POA: Diagnosis not present

## 2014-07-08 DIAGNOSIS — D509 Iron deficiency anemia, unspecified: Secondary | ICD-10-CM | POA: Diagnosis not present

## 2014-07-08 DIAGNOSIS — D689 Coagulation defect, unspecified: Secondary | ICD-10-CM | POA: Diagnosis not present

## 2014-07-08 DIAGNOSIS — D631 Anemia in chronic kidney disease: Secondary | ICD-10-CM | POA: Diagnosis not present

## 2014-07-08 DIAGNOSIS — N186 End stage renal disease: Secondary | ICD-10-CM | POA: Diagnosis not present

## 2014-07-13 DIAGNOSIS — D631 Anemia in chronic kidney disease: Secondary | ICD-10-CM | POA: Diagnosis not present

## 2014-07-13 DIAGNOSIS — N186 End stage renal disease: Secondary | ICD-10-CM | POA: Diagnosis not present

## 2014-07-13 DIAGNOSIS — D689 Coagulation defect, unspecified: Secondary | ICD-10-CM | POA: Diagnosis not present

## 2014-07-13 DIAGNOSIS — D509 Iron deficiency anemia, unspecified: Secondary | ICD-10-CM | POA: Diagnosis not present

## 2014-07-14 DIAGNOSIS — N2889 Other specified disorders of kidney and ureter: Secondary | ICD-10-CM | POA: Diagnosis not present

## 2014-07-14 DIAGNOSIS — N186 End stage renal disease: Secondary | ICD-10-CM | POA: Diagnosis not present

## 2014-07-14 DIAGNOSIS — Z992 Dependence on renal dialysis: Secondary | ICD-10-CM | POA: Diagnosis not present

## 2014-07-15 DIAGNOSIS — D689 Coagulation defect, unspecified: Secondary | ICD-10-CM | POA: Diagnosis not present

## 2014-07-15 DIAGNOSIS — D509 Iron deficiency anemia, unspecified: Secondary | ICD-10-CM | POA: Diagnosis not present

## 2014-07-15 DIAGNOSIS — N186 End stage renal disease: Secondary | ICD-10-CM | POA: Diagnosis not present

## 2014-07-17 DIAGNOSIS — D509 Iron deficiency anemia, unspecified: Secondary | ICD-10-CM | POA: Diagnosis not present

## 2014-07-17 DIAGNOSIS — D689 Coagulation defect, unspecified: Secondary | ICD-10-CM | POA: Diagnosis not present

## 2014-07-17 DIAGNOSIS — N186 End stage renal disease: Secondary | ICD-10-CM | POA: Diagnosis not present

## 2014-07-20 DIAGNOSIS — D689 Coagulation defect, unspecified: Secondary | ICD-10-CM | POA: Diagnosis not present

## 2014-07-20 DIAGNOSIS — D509 Iron deficiency anemia, unspecified: Secondary | ICD-10-CM | POA: Diagnosis not present

## 2014-07-20 DIAGNOSIS — N186 End stage renal disease: Secondary | ICD-10-CM | POA: Diagnosis not present

## 2014-07-22 DIAGNOSIS — D509 Iron deficiency anemia, unspecified: Secondary | ICD-10-CM | POA: Diagnosis not present

## 2014-07-22 DIAGNOSIS — N186 End stage renal disease: Secondary | ICD-10-CM | POA: Diagnosis not present

## 2014-07-22 DIAGNOSIS — D689 Coagulation defect, unspecified: Secondary | ICD-10-CM | POA: Diagnosis not present

## 2014-07-24 DIAGNOSIS — D689 Coagulation defect, unspecified: Secondary | ICD-10-CM | POA: Diagnosis not present

## 2014-07-24 DIAGNOSIS — D509 Iron deficiency anemia, unspecified: Secondary | ICD-10-CM | POA: Diagnosis not present

## 2014-07-24 DIAGNOSIS — N186 End stage renal disease: Secondary | ICD-10-CM | POA: Diagnosis not present

## 2014-07-27 DIAGNOSIS — D509 Iron deficiency anemia, unspecified: Secondary | ICD-10-CM | POA: Diagnosis not present

## 2014-07-27 DIAGNOSIS — N186 End stage renal disease: Secondary | ICD-10-CM | POA: Diagnosis not present

## 2014-07-27 DIAGNOSIS — D689 Coagulation defect, unspecified: Secondary | ICD-10-CM | POA: Diagnosis not present

## 2014-07-29 DIAGNOSIS — D689 Coagulation defect, unspecified: Secondary | ICD-10-CM | POA: Diagnosis not present

## 2014-07-29 DIAGNOSIS — N186 End stage renal disease: Secondary | ICD-10-CM | POA: Diagnosis not present

## 2014-07-29 DIAGNOSIS — D509 Iron deficiency anemia, unspecified: Secondary | ICD-10-CM | POA: Diagnosis not present

## 2014-07-31 DIAGNOSIS — N186 End stage renal disease: Secondary | ICD-10-CM | POA: Diagnosis not present

## 2014-07-31 DIAGNOSIS — D689 Coagulation defect, unspecified: Secondary | ICD-10-CM | POA: Diagnosis not present

## 2014-07-31 DIAGNOSIS — D509 Iron deficiency anemia, unspecified: Secondary | ICD-10-CM | POA: Diagnosis not present

## 2014-08-03 DIAGNOSIS — D689 Coagulation defect, unspecified: Secondary | ICD-10-CM | POA: Diagnosis not present

## 2014-08-03 DIAGNOSIS — D509 Iron deficiency anemia, unspecified: Secondary | ICD-10-CM | POA: Diagnosis not present

## 2014-08-03 DIAGNOSIS — N186 End stage renal disease: Secondary | ICD-10-CM | POA: Diagnosis not present

## 2014-08-05 DIAGNOSIS — N186 End stage renal disease: Secondary | ICD-10-CM | POA: Diagnosis not present

## 2014-08-05 DIAGNOSIS — D689 Coagulation defect, unspecified: Secondary | ICD-10-CM | POA: Diagnosis not present

## 2014-08-05 DIAGNOSIS — D509 Iron deficiency anemia, unspecified: Secondary | ICD-10-CM | POA: Diagnosis not present

## 2014-08-07 DIAGNOSIS — N186 End stage renal disease: Secondary | ICD-10-CM | POA: Diagnosis not present

## 2014-08-07 DIAGNOSIS — D509 Iron deficiency anemia, unspecified: Secondary | ICD-10-CM | POA: Diagnosis not present

## 2014-08-07 DIAGNOSIS — D689 Coagulation defect, unspecified: Secondary | ICD-10-CM | POA: Diagnosis not present

## 2014-08-10 ENCOUNTER — Other Ambulatory Visit: Payer: Self-pay

## 2014-08-10 DIAGNOSIS — N186 End stage renal disease: Secondary | ICD-10-CM | POA: Diagnosis not present

## 2014-08-10 DIAGNOSIS — D509 Iron deficiency anemia, unspecified: Secondary | ICD-10-CM | POA: Diagnosis not present

## 2014-08-10 DIAGNOSIS — D689 Coagulation defect, unspecified: Secondary | ICD-10-CM | POA: Diagnosis not present

## 2014-08-12 DIAGNOSIS — D509 Iron deficiency anemia, unspecified: Secondary | ICD-10-CM | POA: Diagnosis not present

## 2014-08-12 DIAGNOSIS — D689 Coagulation defect, unspecified: Secondary | ICD-10-CM | POA: Diagnosis not present

## 2014-08-12 DIAGNOSIS — N186 End stage renal disease: Secondary | ICD-10-CM | POA: Diagnosis not present

## 2014-08-13 DIAGNOSIS — Z992 Dependence on renal dialysis: Secondary | ICD-10-CM | POA: Diagnosis not present

## 2014-08-13 DIAGNOSIS — N186 End stage renal disease: Secondary | ICD-10-CM | POA: Diagnosis not present

## 2014-08-13 DIAGNOSIS — N2889 Other specified disorders of kidney and ureter: Secondary | ICD-10-CM | POA: Diagnosis not present

## 2014-08-14 DIAGNOSIS — N186 End stage renal disease: Secondary | ICD-10-CM | POA: Diagnosis not present

## 2014-08-14 DIAGNOSIS — D631 Anemia in chronic kidney disease: Secondary | ICD-10-CM | POA: Diagnosis not present

## 2014-08-14 DIAGNOSIS — D509 Iron deficiency anemia, unspecified: Secondary | ICD-10-CM | POA: Diagnosis not present

## 2014-08-14 DIAGNOSIS — D689 Coagulation defect, unspecified: Secondary | ICD-10-CM | POA: Diagnosis not present

## 2014-08-17 DIAGNOSIS — N186 End stage renal disease: Secondary | ICD-10-CM | POA: Diagnosis not present

## 2014-08-17 DIAGNOSIS — D689 Coagulation defect, unspecified: Secondary | ICD-10-CM | POA: Diagnosis not present

## 2014-08-17 DIAGNOSIS — D509 Iron deficiency anemia, unspecified: Secondary | ICD-10-CM | POA: Diagnosis not present

## 2014-08-17 DIAGNOSIS — D631 Anemia in chronic kidney disease: Secondary | ICD-10-CM | POA: Diagnosis not present

## 2014-08-19 DIAGNOSIS — D509 Iron deficiency anemia, unspecified: Secondary | ICD-10-CM | POA: Diagnosis not present

## 2014-08-19 DIAGNOSIS — D689 Coagulation defect, unspecified: Secondary | ICD-10-CM | POA: Diagnosis not present

## 2014-08-19 DIAGNOSIS — N186 End stage renal disease: Secondary | ICD-10-CM | POA: Diagnosis not present

## 2014-08-19 DIAGNOSIS — D631 Anemia in chronic kidney disease: Secondary | ICD-10-CM | POA: Diagnosis not present

## 2014-08-21 DIAGNOSIS — D689 Coagulation defect, unspecified: Secondary | ICD-10-CM | POA: Diagnosis not present

## 2014-08-21 DIAGNOSIS — N186 End stage renal disease: Secondary | ICD-10-CM | POA: Diagnosis not present

## 2014-08-21 DIAGNOSIS — D631 Anemia in chronic kidney disease: Secondary | ICD-10-CM | POA: Diagnosis not present

## 2014-08-21 DIAGNOSIS — D509 Iron deficiency anemia, unspecified: Secondary | ICD-10-CM | POA: Diagnosis not present

## 2014-08-24 DIAGNOSIS — D509 Iron deficiency anemia, unspecified: Secondary | ICD-10-CM | POA: Diagnosis not present

## 2014-08-24 DIAGNOSIS — D689 Coagulation defect, unspecified: Secondary | ICD-10-CM | POA: Diagnosis not present

## 2014-08-24 DIAGNOSIS — D631 Anemia in chronic kidney disease: Secondary | ICD-10-CM | POA: Diagnosis not present

## 2014-08-24 DIAGNOSIS — N186 End stage renal disease: Secondary | ICD-10-CM | POA: Diagnosis not present

## 2014-08-26 DIAGNOSIS — D631 Anemia in chronic kidney disease: Secondary | ICD-10-CM | POA: Diagnosis not present

## 2014-08-26 DIAGNOSIS — D509 Iron deficiency anemia, unspecified: Secondary | ICD-10-CM | POA: Diagnosis not present

## 2014-08-26 DIAGNOSIS — D689 Coagulation defect, unspecified: Secondary | ICD-10-CM | POA: Diagnosis not present

## 2014-08-26 DIAGNOSIS — N186 End stage renal disease: Secondary | ICD-10-CM | POA: Diagnosis not present

## 2014-08-28 DIAGNOSIS — D631 Anemia in chronic kidney disease: Secondary | ICD-10-CM | POA: Diagnosis not present

## 2014-08-28 DIAGNOSIS — D689 Coagulation defect, unspecified: Secondary | ICD-10-CM | POA: Diagnosis not present

## 2014-08-28 DIAGNOSIS — N186 End stage renal disease: Secondary | ICD-10-CM | POA: Diagnosis not present

## 2014-08-28 DIAGNOSIS — D509 Iron deficiency anemia, unspecified: Secondary | ICD-10-CM | POA: Diagnosis not present

## 2014-08-31 DIAGNOSIS — N186 End stage renal disease: Secondary | ICD-10-CM | POA: Diagnosis not present

## 2014-08-31 DIAGNOSIS — D689 Coagulation defect, unspecified: Secondary | ICD-10-CM | POA: Diagnosis not present

## 2014-08-31 DIAGNOSIS — D509 Iron deficiency anemia, unspecified: Secondary | ICD-10-CM | POA: Diagnosis not present

## 2014-08-31 DIAGNOSIS — D631 Anemia in chronic kidney disease: Secondary | ICD-10-CM | POA: Diagnosis not present

## 2014-09-02 DIAGNOSIS — N186 End stage renal disease: Secondary | ICD-10-CM | POA: Diagnosis not present

## 2014-09-02 DIAGNOSIS — D689 Coagulation defect, unspecified: Secondary | ICD-10-CM | POA: Diagnosis not present

## 2014-09-02 DIAGNOSIS — D631 Anemia in chronic kidney disease: Secondary | ICD-10-CM | POA: Diagnosis not present

## 2014-09-02 DIAGNOSIS — D509 Iron deficiency anemia, unspecified: Secondary | ICD-10-CM | POA: Diagnosis not present

## 2014-09-04 DIAGNOSIS — N186 End stage renal disease: Secondary | ICD-10-CM | POA: Diagnosis not present

## 2014-09-04 DIAGNOSIS — D509 Iron deficiency anemia, unspecified: Secondary | ICD-10-CM | POA: Diagnosis not present

## 2014-09-04 DIAGNOSIS — D689 Coagulation defect, unspecified: Secondary | ICD-10-CM | POA: Diagnosis not present

## 2014-09-04 DIAGNOSIS — D631 Anemia in chronic kidney disease: Secondary | ICD-10-CM | POA: Diagnosis not present

## 2014-09-07 DIAGNOSIS — N186 End stage renal disease: Secondary | ICD-10-CM | POA: Diagnosis not present

## 2014-09-07 DIAGNOSIS — D631 Anemia in chronic kidney disease: Secondary | ICD-10-CM | POA: Diagnosis not present

## 2014-09-07 DIAGNOSIS — D689 Coagulation defect, unspecified: Secondary | ICD-10-CM | POA: Diagnosis not present

## 2014-09-07 DIAGNOSIS — D509 Iron deficiency anemia, unspecified: Secondary | ICD-10-CM | POA: Diagnosis not present

## 2014-09-09 DIAGNOSIS — D689 Coagulation defect, unspecified: Secondary | ICD-10-CM | POA: Diagnosis not present

## 2014-09-09 DIAGNOSIS — D509 Iron deficiency anemia, unspecified: Secondary | ICD-10-CM | POA: Diagnosis not present

## 2014-09-09 DIAGNOSIS — N186 End stage renal disease: Secondary | ICD-10-CM | POA: Diagnosis not present

## 2014-09-09 DIAGNOSIS — D631 Anemia in chronic kidney disease: Secondary | ICD-10-CM | POA: Diagnosis not present

## 2014-09-11 DIAGNOSIS — D689 Coagulation defect, unspecified: Secondary | ICD-10-CM | POA: Diagnosis not present

## 2014-09-11 DIAGNOSIS — N186 End stage renal disease: Secondary | ICD-10-CM | POA: Diagnosis not present

## 2014-09-11 DIAGNOSIS — D631 Anemia in chronic kidney disease: Secondary | ICD-10-CM | POA: Diagnosis not present

## 2014-09-11 DIAGNOSIS — D509 Iron deficiency anemia, unspecified: Secondary | ICD-10-CM | POA: Diagnosis not present

## 2014-09-13 DIAGNOSIS — Z992 Dependence on renal dialysis: Secondary | ICD-10-CM | POA: Diagnosis not present

## 2014-09-13 DIAGNOSIS — N2889 Other specified disorders of kidney and ureter: Secondary | ICD-10-CM | POA: Diagnosis not present

## 2014-09-13 DIAGNOSIS — N186 End stage renal disease: Secondary | ICD-10-CM | POA: Diagnosis not present

## 2014-09-14 DIAGNOSIS — D689 Coagulation defect, unspecified: Secondary | ICD-10-CM | POA: Diagnosis not present

## 2014-09-14 DIAGNOSIS — N186 End stage renal disease: Secondary | ICD-10-CM | POA: Diagnosis not present

## 2014-09-14 DIAGNOSIS — D631 Anemia in chronic kidney disease: Secondary | ICD-10-CM | POA: Diagnosis not present

## 2014-09-14 DIAGNOSIS — D509 Iron deficiency anemia, unspecified: Secondary | ICD-10-CM | POA: Diagnosis not present

## 2014-09-16 DIAGNOSIS — D689 Coagulation defect, unspecified: Secondary | ICD-10-CM | POA: Diagnosis not present

## 2014-09-16 DIAGNOSIS — N186 End stage renal disease: Secondary | ICD-10-CM | POA: Diagnosis not present

## 2014-09-16 DIAGNOSIS — D631 Anemia in chronic kidney disease: Secondary | ICD-10-CM | POA: Diagnosis not present

## 2014-09-16 DIAGNOSIS — D509 Iron deficiency anemia, unspecified: Secondary | ICD-10-CM | POA: Diagnosis not present

## 2014-09-18 DIAGNOSIS — N186 End stage renal disease: Secondary | ICD-10-CM | POA: Diagnosis not present

## 2014-09-18 DIAGNOSIS — D509 Iron deficiency anemia, unspecified: Secondary | ICD-10-CM | POA: Diagnosis not present

## 2014-09-18 DIAGNOSIS — D631 Anemia in chronic kidney disease: Secondary | ICD-10-CM | POA: Diagnosis not present

## 2014-09-18 DIAGNOSIS — D689 Coagulation defect, unspecified: Secondary | ICD-10-CM | POA: Diagnosis not present

## 2014-09-21 DIAGNOSIS — D631 Anemia in chronic kidney disease: Secondary | ICD-10-CM | POA: Diagnosis not present

## 2014-09-21 DIAGNOSIS — D509 Iron deficiency anemia, unspecified: Secondary | ICD-10-CM | POA: Diagnosis not present

## 2014-09-21 DIAGNOSIS — D689 Coagulation defect, unspecified: Secondary | ICD-10-CM | POA: Diagnosis not present

## 2014-09-21 DIAGNOSIS — N186 End stage renal disease: Secondary | ICD-10-CM | POA: Diagnosis not present

## 2014-09-23 DIAGNOSIS — N186 End stage renal disease: Secondary | ICD-10-CM | POA: Diagnosis not present

## 2014-09-23 DIAGNOSIS — D689 Coagulation defect, unspecified: Secondary | ICD-10-CM | POA: Diagnosis not present

## 2014-09-23 DIAGNOSIS — D631 Anemia in chronic kidney disease: Secondary | ICD-10-CM | POA: Diagnosis not present

## 2014-09-23 DIAGNOSIS — D509 Iron deficiency anemia, unspecified: Secondary | ICD-10-CM | POA: Diagnosis not present

## 2014-09-25 DIAGNOSIS — N186 End stage renal disease: Secondary | ICD-10-CM | POA: Diagnosis not present

## 2014-09-25 DIAGNOSIS — D631 Anemia in chronic kidney disease: Secondary | ICD-10-CM | POA: Diagnosis not present

## 2014-09-25 DIAGNOSIS — D689 Coagulation defect, unspecified: Secondary | ICD-10-CM | POA: Diagnosis not present

## 2014-09-25 DIAGNOSIS — D509 Iron deficiency anemia, unspecified: Secondary | ICD-10-CM | POA: Diagnosis not present

## 2014-09-28 DIAGNOSIS — N186 End stage renal disease: Secondary | ICD-10-CM | POA: Diagnosis not present

## 2014-09-28 DIAGNOSIS — D509 Iron deficiency anemia, unspecified: Secondary | ICD-10-CM | POA: Diagnosis not present

## 2014-09-28 DIAGNOSIS — D631 Anemia in chronic kidney disease: Secondary | ICD-10-CM | POA: Diagnosis not present

## 2014-09-28 DIAGNOSIS — D689 Coagulation defect, unspecified: Secondary | ICD-10-CM | POA: Diagnosis not present

## 2014-09-30 DIAGNOSIS — D689 Coagulation defect, unspecified: Secondary | ICD-10-CM | POA: Diagnosis not present

## 2014-09-30 DIAGNOSIS — D631 Anemia in chronic kidney disease: Secondary | ICD-10-CM | POA: Diagnosis not present

## 2014-09-30 DIAGNOSIS — D509 Iron deficiency anemia, unspecified: Secondary | ICD-10-CM | POA: Diagnosis not present

## 2014-09-30 DIAGNOSIS — N186 End stage renal disease: Secondary | ICD-10-CM | POA: Diagnosis not present

## 2014-10-02 DIAGNOSIS — D631 Anemia in chronic kidney disease: Secondary | ICD-10-CM | POA: Diagnosis not present

## 2014-10-02 DIAGNOSIS — D689 Coagulation defect, unspecified: Secondary | ICD-10-CM | POA: Diagnosis not present

## 2014-10-02 DIAGNOSIS — N186 End stage renal disease: Secondary | ICD-10-CM | POA: Diagnosis not present

## 2014-10-02 DIAGNOSIS — D509 Iron deficiency anemia, unspecified: Secondary | ICD-10-CM | POA: Diagnosis not present

## 2014-10-05 DIAGNOSIS — D689 Coagulation defect, unspecified: Secondary | ICD-10-CM | POA: Diagnosis not present

## 2014-10-05 DIAGNOSIS — N186 End stage renal disease: Secondary | ICD-10-CM | POA: Diagnosis not present

## 2014-10-05 DIAGNOSIS — D631 Anemia in chronic kidney disease: Secondary | ICD-10-CM | POA: Diagnosis not present

## 2014-10-05 DIAGNOSIS — D509 Iron deficiency anemia, unspecified: Secondary | ICD-10-CM | POA: Diagnosis not present

## 2014-10-07 DIAGNOSIS — D689 Coagulation defect, unspecified: Secondary | ICD-10-CM | POA: Diagnosis not present

## 2014-10-07 DIAGNOSIS — D509 Iron deficiency anemia, unspecified: Secondary | ICD-10-CM | POA: Diagnosis not present

## 2014-10-07 DIAGNOSIS — N186 End stage renal disease: Secondary | ICD-10-CM | POA: Diagnosis not present

## 2014-10-07 DIAGNOSIS — D631 Anemia in chronic kidney disease: Secondary | ICD-10-CM | POA: Diagnosis not present

## 2014-10-09 DIAGNOSIS — N186 End stage renal disease: Secondary | ICD-10-CM | POA: Diagnosis not present

## 2014-10-09 DIAGNOSIS — D689 Coagulation defect, unspecified: Secondary | ICD-10-CM | POA: Diagnosis not present

## 2014-10-09 DIAGNOSIS — D509 Iron deficiency anemia, unspecified: Secondary | ICD-10-CM | POA: Diagnosis not present

## 2014-10-09 DIAGNOSIS — D631 Anemia in chronic kidney disease: Secondary | ICD-10-CM | POA: Diagnosis not present

## 2014-10-12 DIAGNOSIS — D509 Iron deficiency anemia, unspecified: Secondary | ICD-10-CM | POA: Diagnosis not present

## 2014-10-12 DIAGNOSIS — D631 Anemia in chronic kidney disease: Secondary | ICD-10-CM | POA: Diagnosis not present

## 2014-10-12 DIAGNOSIS — N186 End stage renal disease: Secondary | ICD-10-CM | POA: Diagnosis not present

## 2014-10-12 DIAGNOSIS — D689 Coagulation defect, unspecified: Secondary | ICD-10-CM | POA: Diagnosis not present

## 2014-10-14 DIAGNOSIS — N186 End stage renal disease: Secondary | ICD-10-CM | POA: Diagnosis not present

## 2014-10-14 DIAGNOSIS — D689 Coagulation defect, unspecified: Secondary | ICD-10-CM | POA: Diagnosis not present

## 2014-10-14 DIAGNOSIS — D631 Anemia in chronic kidney disease: Secondary | ICD-10-CM | POA: Diagnosis not present

## 2014-10-14 DIAGNOSIS — N2889 Other specified disorders of kidney and ureter: Secondary | ICD-10-CM | POA: Diagnosis not present

## 2014-10-14 DIAGNOSIS — D509 Iron deficiency anemia, unspecified: Secondary | ICD-10-CM | POA: Diagnosis not present

## 2014-10-14 DIAGNOSIS — Z992 Dependence on renal dialysis: Secondary | ICD-10-CM | POA: Diagnosis not present

## 2014-10-16 DIAGNOSIS — D689 Coagulation defect, unspecified: Secondary | ICD-10-CM | POA: Diagnosis not present

## 2014-10-16 DIAGNOSIS — N186 End stage renal disease: Secondary | ICD-10-CM | POA: Diagnosis not present

## 2014-10-16 DIAGNOSIS — D509 Iron deficiency anemia, unspecified: Secondary | ICD-10-CM | POA: Diagnosis not present

## 2014-10-16 DIAGNOSIS — D631 Anemia in chronic kidney disease: Secondary | ICD-10-CM | POA: Diagnosis not present

## 2014-10-19 DIAGNOSIS — D509 Iron deficiency anemia, unspecified: Secondary | ICD-10-CM | POA: Diagnosis not present

## 2014-10-19 DIAGNOSIS — D631 Anemia in chronic kidney disease: Secondary | ICD-10-CM | POA: Diagnosis not present

## 2014-10-19 DIAGNOSIS — N186 End stage renal disease: Secondary | ICD-10-CM | POA: Diagnosis not present

## 2014-10-19 DIAGNOSIS — D689 Coagulation defect, unspecified: Secondary | ICD-10-CM | POA: Diagnosis not present

## 2014-10-21 DIAGNOSIS — D689 Coagulation defect, unspecified: Secondary | ICD-10-CM | POA: Diagnosis not present

## 2014-10-21 DIAGNOSIS — N186 End stage renal disease: Secondary | ICD-10-CM | POA: Diagnosis not present

## 2014-10-21 DIAGNOSIS — D631 Anemia in chronic kidney disease: Secondary | ICD-10-CM | POA: Diagnosis not present

## 2014-10-21 DIAGNOSIS — D509 Iron deficiency anemia, unspecified: Secondary | ICD-10-CM | POA: Diagnosis not present

## 2014-10-23 DIAGNOSIS — D689 Coagulation defect, unspecified: Secondary | ICD-10-CM | POA: Diagnosis not present

## 2014-10-23 DIAGNOSIS — D631 Anemia in chronic kidney disease: Secondary | ICD-10-CM | POA: Diagnosis not present

## 2014-10-23 DIAGNOSIS — N186 End stage renal disease: Secondary | ICD-10-CM | POA: Diagnosis not present

## 2014-10-23 DIAGNOSIS — D509 Iron deficiency anemia, unspecified: Secondary | ICD-10-CM | POA: Diagnosis not present

## 2014-10-26 DIAGNOSIS — N186 End stage renal disease: Secondary | ICD-10-CM | POA: Diagnosis not present

## 2014-10-26 DIAGNOSIS — D689 Coagulation defect, unspecified: Secondary | ICD-10-CM | POA: Diagnosis not present

## 2014-10-26 DIAGNOSIS — D509 Iron deficiency anemia, unspecified: Secondary | ICD-10-CM | POA: Diagnosis not present

## 2014-10-26 DIAGNOSIS — D631 Anemia in chronic kidney disease: Secondary | ICD-10-CM | POA: Diagnosis not present

## 2014-10-28 DIAGNOSIS — N186 End stage renal disease: Secondary | ICD-10-CM | POA: Diagnosis not present

## 2014-10-28 DIAGNOSIS — D509 Iron deficiency anemia, unspecified: Secondary | ICD-10-CM | POA: Diagnosis not present

## 2014-10-28 DIAGNOSIS — D631 Anemia in chronic kidney disease: Secondary | ICD-10-CM | POA: Diagnosis not present

## 2014-10-28 DIAGNOSIS — D689 Coagulation defect, unspecified: Secondary | ICD-10-CM | POA: Diagnosis not present

## 2014-10-30 DIAGNOSIS — D631 Anemia in chronic kidney disease: Secondary | ICD-10-CM | POA: Diagnosis not present

## 2014-10-30 DIAGNOSIS — N186 End stage renal disease: Secondary | ICD-10-CM | POA: Diagnosis not present

## 2014-10-30 DIAGNOSIS — D689 Coagulation defect, unspecified: Secondary | ICD-10-CM | POA: Diagnosis not present

## 2014-10-30 DIAGNOSIS — D509 Iron deficiency anemia, unspecified: Secondary | ICD-10-CM | POA: Diagnosis not present

## 2014-11-02 DIAGNOSIS — D689 Coagulation defect, unspecified: Secondary | ICD-10-CM | POA: Diagnosis not present

## 2014-11-02 DIAGNOSIS — D631 Anemia in chronic kidney disease: Secondary | ICD-10-CM | POA: Diagnosis not present

## 2014-11-02 DIAGNOSIS — D509 Iron deficiency anemia, unspecified: Secondary | ICD-10-CM | POA: Diagnosis not present

## 2014-11-02 DIAGNOSIS — N186 End stage renal disease: Secondary | ICD-10-CM | POA: Diagnosis not present

## 2014-11-04 DIAGNOSIS — D631 Anemia in chronic kidney disease: Secondary | ICD-10-CM | POA: Diagnosis not present

## 2014-11-04 DIAGNOSIS — D689 Coagulation defect, unspecified: Secondary | ICD-10-CM | POA: Diagnosis not present

## 2014-11-04 DIAGNOSIS — D509 Iron deficiency anemia, unspecified: Secondary | ICD-10-CM | POA: Diagnosis not present

## 2014-11-04 DIAGNOSIS — N186 End stage renal disease: Secondary | ICD-10-CM | POA: Diagnosis not present

## 2014-11-06 DIAGNOSIS — D689 Coagulation defect, unspecified: Secondary | ICD-10-CM | POA: Diagnosis not present

## 2014-11-06 DIAGNOSIS — N186 End stage renal disease: Secondary | ICD-10-CM | POA: Diagnosis not present

## 2014-11-06 DIAGNOSIS — D509 Iron deficiency anemia, unspecified: Secondary | ICD-10-CM | POA: Diagnosis not present

## 2014-11-06 DIAGNOSIS — D631 Anemia in chronic kidney disease: Secondary | ICD-10-CM | POA: Diagnosis not present

## 2014-11-09 DIAGNOSIS — D631 Anemia in chronic kidney disease: Secondary | ICD-10-CM | POA: Diagnosis not present

## 2014-11-09 DIAGNOSIS — N186 End stage renal disease: Secondary | ICD-10-CM | POA: Diagnosis not present

## 2014-11-09 DIAGNOSIS — D689 Coagulation defect, unspecified: Secondary | ICD-10-CM | POA: Diagnosis not present

## 2014-11-09 DIAGNOSIS — D509 Iron deficiency anemia, unspecified: Secondary | ICD-10-CM | POA: Diagnosis not present

## 2014-11-11 DIAGNOSIS — N186 End stage renal disease: Secondary | ICD-10-CM | POA: Diagnosis not present

## 2014-11-11 DIAGNOSIS — D631 Anemia in chronic kidney disease: Secondary | ICD-10-CM | POA: Diagnosis not present

## 2014-11-11 DIAGNOSIS — D689 Coagulation defect, unspecified: Secondary | ICD-10-CM | POA: Diagnosis not present

## 2014-11-11 DIAGNOSIS — D509 Iron deficiency anemia, unspecified: Secondary | ICD-10-CM | POA: Diagnosis not present

## 2014-11-13 DIAGNOSIS — D689 Coagulation defect, unspecified: Secondary | ICD-10-CM | POA: Diagnosis not present

## 2014-11-13 DIAGNOSIS — D509 Iron deficiency anemia, unspecified: Secondary | ICD-10-CM | POA: Diagnosis not present

## 2014-11-13 DIAGNOSIS — D631 Anemia in chronic kidney disease: Secondary | ICD-10-CM | POA: Diagnosis not present

## 2014-11-13 DIAGNOSIS — Z992 Dependence on renal dialysis: Secondary | ICD-10-CM | POA: Diagnosis not present

## 2014-11-13 DIAGNOSIS — N2889 Other specified disorders of kidney and ureter: Secondary | ICD-10-CM | POA: Diagnosis not present

## 2014-11-13 DIAGNOSIS — N186 End stage renal disease: Secondary | ICD-10-CM | POA: Diagnosis not present

## 2014-11-16 DIAGNOSIS — Z23 Encounter for immunization: Secondary | ICD-10-CM | POA: Diagnosis not present

## 2014-11-16 DIAGNOSIS — N186 End stage renal disease: Secondary | ICD-10-CM | POA: Diagnosis not present

## 2014-11-16 DIAGNOSIS — D509 Iron deficiency anemia, unspecified: Secondary | ICD-10-CM | POA: Diagnosis not present

## 2014-11-16 DIAGNOSIS — D689 Coagulation defect, unspecified: Secondary | ICD-10-CM | POA: Diagnosis not present

## 2014-11-18 DIAGNOSIS — D689 Coagulation defect, unspecified: Secondary | ICD-10-CM | POA: Diagnosis not present

## 2014-11-18 DIAGNOSIS — D509 Iron deficiency anemia, unspecified: Secondary | ICD-10-CM | POA: Diagnosis not present

## 2014-11-18 DIAGNOSIS — Z23 Encounter for immunization: Secondary | ICD-10-CM | POA: Diagnosis not present

## 2014-11-18 DIAGNOSIS — N186 End stage renal disease: Secondary | ICD-10-CM | POA: Diagnosis not present

## 2014-11-20 DIAGNOSIS — Z23 Encounter for immunization: Secondary | ICD-10-CM | POA: Diagnosis not present

## 2014-11-20 DIAGNOSIS — D689 Coagulation defect, unspecified: Secondary | ICD-10-CM | POA: Diagnosis not present

## 2014-11-20 DIAGNOSIS — N186 End stage renal disease: Secondary | ICD-10-CM | POA: Diagnosis not present

## 2014-11-20 DIAGNOSIS — D509 Iron deficiency anemia, unspecified: Secondary | ICD-10-CM | POA: Diagnosis not present

## 2014-11-23 DIAGNOSIS — D689 Coagulation defect, unspecified: Secondary | ICD-10-CM | POA: Diagnosis not present

## 2014-11-23 DIAGNOSIS — N186 End stage renal disease: Secondary | ICD-10-CM | POA: Diagnosis not present

## 2014-11-23 DIAGNOSIS — D509 Iron deficiency anemia, unspecified: Secondary | ICD-10-CM | POA: Diagnosis not present

## 2014-11-23 DIAGNOSIS — Z23 Encounter for immunization: Secondary | ICD-10-CM | POA: Diagnosis not present

## 2014-11-25 DIAGNOSIS — D689 Coagulation defect, unspecified: Secondary | ICD-10-CM | POA: Diagnosis not present

## 2014-11-25 DIAGNOSIS — Z23 Encounter for immunization: Secondary | ICD-10-CM | POA: Diagnosis not present

## 2014-11-25 DIAGNOSIS — D509 Iron deficiency anemia, unspecified: Secondary | ICD-10-CM | POA: Diagnosis not present

## 2014-11-25 DIAGNOSIS — N186 End stage renal disease: Secondary | ICD-10-CM | POA: Diagnosis not present

## 2014-11-27 DIAGNOSIS — N186 End stage renal disease: Secondary | ICD-10-CM | POA: Diagnosis not present

## 2014-11-27 DIAGNOSIS — Z23 Encounter for immunization: Secondary | ICD-10-CM | POA: Diagnosis not present

## 2014-11-27 DIAGNOSIS — D689 Coagulation defect, unspecified: Secondary | ICD-10-CM | POA: Diagnosis not present

## 2014-11-27 DIAGNOSIS — D509 Iron deficiency anemia, unspecified: Secondary | ICD-10-CM | POA: Diagnosis not present

## 2014-11-30 DIAGNOSIS — D689 Coagulation defect, unspecified: Secondary | ICD-10-CM | POA: Diagnosis not present

## 2014-11-30 DIAGNOSIS — N186 End stage renal disease: Secondary | ICD-10-CM | POA: Diagnosis not present

## 2014-11-30 DIAGNOSIS — D509 Iron deficiency anemia, unspecified: Secondary | ICD-10-CM | POA: Diagnosis not present

## 2014-11-30 DIAGNOSIS — Z23 Encounter for immunization: Secondary | ICD-10-CM | POA: Diagnosis not present

## 2014-12-02 DIAGNOSIS — D509 Iron deficiency anemia, unspecified: Secondary | ICD-10-CM | POA: Diagnosis not present

## 2014-12-02 DIAGNOSIS — D689 Coagulation defect, unspecified: Secondary | ICD-10-CM | POA: Diagnosis not present

## 2014-12-02 DIAGNOSIS — Z23 Encounter for immunization: Secondary | ICD-10-CM | POA: Diagnosis not present

## 2014-12-02 DIAGNOSIS — N186 End stage renal disease: Secondary | ICD-10-CM | POA: Diagnosis not present

## 2014-12-04 DIAGNOSIS — D689 Coagulation defect, unspecified: Secondary | ICD-10-CM | POA: Diagnosis not present

## 2014-12-04 DIAGNOSIS — Z23 Encounter for immunization: Secondary | ICD-10-CM | POA: Diagnosis not present

## 2014-12-04 DIAGNOSIS — D509 Iron deficiency anemia, unspecified: Secondary | ICD-10-CM | POA: Diagnosis not present

## 2014-12-04 DIAGNOSIS — N186 End stage renal disease: Secondary | ICD-10-CM | POA: Diagnosis not present

## 2014-12-07 DIAGNOSIS — D689 Coagulation defect, unspecified: Secondary | ICD-10-CM | POA: Diagnosis not present

## 2014-12-07 DIAGNOSIS — Z23 Encounter for immunization: Secondary | ICD-10-CM | POA: Diagnosis not present

## 2014-12-07 DIAGNOSIS — N186 End stage renal disease: Secondary | ICD-10-CM | POA: Diagnosis not present

## 2014-12-07 DIAGNOSIS — D509 Iron deficiency anemia, unspecified: Secondary | ICD-10-CM | POA: Diagnosis not present

## 2014-12-09 DIAGNOSIS — D689 Coagulation defect, unspecified: Secondary | ICD-10-CM | POA: Diagnosis not present

## 2014-12-09 DIAGNOSIS — D509 Iron deficiency anemia, unspecified: Secondary | ICD-10-CM | POA: Diagnosis not present

## 2014-12-09 DIAGNOSIS — Z23 Encounter for immunization: Secondary | ICD-10-CM | POA: Diagnosis not present

## 2014-12-09 DIAGNOSIS — N186 End stage renal disease: Secondary | ICD-10-CM | POA: Diagnosis not present

## 2014-12-11 DIAGNOSIS — D509 Iron deficiency anemia, unspecified: Secondary | ICD-10-CM | POA: Diagnosis not present

## 2014-12-11 DIAGNOSIS — Z23 Encounter for immunization: Secondary | ICD-10-CM | POA: Diagnosis not present

## 2014-12-11 DIAGNOSIS — D689 Coagulation defect, unspecified: Secondary | ICD-10-CM | POA: Diagnosis not present

## 2014-12-11 DIAGNOSIS — N186 End stage renal disease: Secondary | ICD-10-CM | POA: Diagnosis not present

## 2014-12-14 DIAGNOSIS — Z23 Encounter for immunization: Secondary | ICD-10-CM | POA: Diagnosis not present

## 2014-12-14 DIAGNOSIS — N2889 Other specified disorders of kidney and ureter: Secondary | ICD-10-CM | POA: Diagnosis not present

## 2014-12-14 DIAGNOSIS — D689 Coagulation defect, unspecified: Secondary | ICD-10-CM | POA: Diagnosis not present

## 2014-12-14 DIAGNOSIS — D509 Iron deficiency anemia, unspecified: Secondary | ICD-10-CM | POA: Diagnosis not present

## 2014-12-14 DIAGNOSIS — N186 End stage renal disease: Secondary | ICD-10-CM | POA: Diagnosis not present

## 2014-12-14 DIAGNOSIS — Z992 Dependence on renal dialysis: Secondary | ICD-10-CM | POA: Diagnosis not present

## 2014-12-16 DIAGNOSIS — N186 End stage renal disease: Secondary | ICD-10-CM | POA: Diagnosis not present

## 2014-12-16 DIAGNOSIS — D689 Coagulation defect, unspecified: Secondary | ICD-10-CM | POA: Diagnosis not present

## 2014-12-16 DIAGNOSIS — D631 Anemia in chronic kidney disease: Secondary | ICD-10-CM | POA: Diagnosis not present

## 2014-12-16 DIAGNOSIS — D509 Iron deficiency anemia, unspecified: Secondary | ICD-10-CM | POA: Diagnosis not present

## 2014-12-18 DIAGNOSIS — D689 Coagulation defect, unspecified: Secondary | ICD-10-CM | POA: Diagnosis not present

## 2014-12-18 DIAGNOSIS — N186 End stage renal disease: Secondary | ICD-10-CM | POA: Diagnosis not present

## 2014-12-18 DIAGNOSIS — D631 Anemia in chronic kidney disease: Secondary | ICD-10-CM | POA: Diagnosis not present

## 2014-12-18 DIAGNOSIS — D509 Iron deficiency anemia, unspecified: Secondary | ICD-10-CM | POA: Diagnosis not present

## 2014-12-21 DIAGNOSIS — D689 Coagulation defect, unspecified: Secondary | ICD-10-CM | POA: Diagnosis not present

## 2014-12-21 DIAGNOSIS — D509 Iron deficiency anemia, unspecified: Secondary | ICD-10-CM | POA: Diagnosis not present

## 2014-12-21 DIAGNOSIS — N186 End stage renal disease: Secondary | ICD-10-CM | POA: Diagnosis not present

## 2014-12-21 DIAGNOSIS — D631 Anemia in chronic kidney disease: Secondary | ICD-10-CM | POA: Diagnosis not present

## 2014-12-23 DIAGNOSIS — D509 Iron deficiency anemia, unspecified: Secondary | ICD-10-CM | POA: Diagnosis not present

## 2014-12-23 DIAGNOSIS — D689 Coagulation defect, unspecified: Secondary | ICD-10-CM | POA: Diagnosis not present

## 2014-12-23 DIAGNOSIS — D631 Anemia in chronic kidney disease: Secondary | ICD-10-CM | POA: Diagnosis not present

## 2014-12-23 DIAGNOSIS — N186 End stage renal disease: Secondary | ICD-10-CM | POA: Diagnosis not present

## 2014-12-24 DIAGNOSIS — D689 Coagulation defect, unspecified: Secondary | ICD-10-CM | POA: Diagnosis not present

## 2014-12-24 DIAGNOSIS — D509 Iron deficiency anemia, unspecified: Secondary | ICD-10-CM | POA: Diagnosis not present

## 2014-12-24 DIAGNOSIS — D631 Anemia in chronic kidney disease: Secondary | ICD-10-CM | POA: Diagnosis not present

## 2014-12-24 DIAGNOSIS — N186 End stage renal disease: Secondary | ICD-10-CM | POA: Diagnosis not present

## 2014-12-28 DIAGNOSIS — D509 Iron deficiency anemia, unspecified: Secondary | ICD-10-CM | POA: Diagnosis not present

## 2014-12-28 DIAGNOSIS — D631 Anemia in chronic kidney disease: Secondary | ICD-10-CM | POA: Diagnosis not present

## 2014-12-28 DIAGNOSIS — D689 Coagulation defect, unspecified: Secondary | ICD-10-CM | POA: Diagnosis not present

## 2014-12-28 DIAGNOSIS — N186 End stage renal disease: Secondary | ICD-10-CM | POA: Diagnosis not present

## 2014-12-30 DIAGNOSIS — D509 Iron deficiency anemia, unspecified: Secondary | ICD-10-CM | POA: Diagnosis not present

## 2014-12-30 DIAGNOSIS — N186 End stage renal disease: Secondary | ICD-10-CM | POA: Diagnosis not present

## 2014-12-30 DIAGNOSIS — D689 Coagulation defect, unspecified: Secondary | ICD-10-CM | POA: Diagnosis not present

## 2014-12-30 DIAGNOSIS — D631 Anemia in chronic kidney disease: Secondary | ICD-10-CM | POA: Diagnosis not present

## 2015-01-01 DIAGNOSIS — D689 Coagulation defect, unspecified: Secondary | ICD-10-CM | POA: Diagnosis not present

## 2015-01-01 DIAGNOSIS — D631 Anemia in chronic kidney disease: Secondary | ICD-10-CM | POA: Diagnosis not present

## 2015-01-01 DIAGNOSIS — D509 Iron deficiency anemia, unspecified: Secondary | ICD-10-CM | POA: Diagnosis not present

## 2015-01-01 DIAGNOSIS — N186 End stage renal disease: Secondary | ICD-10-CM | POA: Diagnosis not present

## 2015-01-04 DIAGNOSIS — D631 Anemia in chronic kidney disease: Secondary | ICD-10-CM | POA: Diagnosis not present

## 2015-01-04 DIAGNOSIS — N186 End stage renal disease: Secondary | ICD-10-CM | POA: Diagnosis not present

## 2015-01-04 DIAGNOSIS — D689 Coagulation defect, unspecified: Secondary | ICD-10-CM | POA: Diagnosis not present

## 2015-01-04 DIAGNOSIS — D509 Iron deficiency anemia, unspecified: Secondary | ICD-10-CM | POA: Diagnosis not present

## 2015-01-06 DIAGNOSIS — N186 End stage renal disease: Secondary | ICD-10-CM | POA: Diagnosis not present

## 2015-01-06 DIAGNOSIS — D509 Iron deficiency anemia, unspecified: Secondary | ICD-10-CM | POA: Diagnosis not present

## 2015-01-06 DIAGNOSIS — D689 Coagulation defect, unspecified: Secondary | ICD-10-CM | POA: Diagnosis not present

## 2015-01-06 DIAGNOSIS — D631 Anemia in chronic kidney disease: Secondary | ICD-10-CM | POA: Diagnosis not present

## 2015-01-09 DIAGNOSIS — D509 Iron deficiency anemia, unspecified: Secondary | ICD-10-CM | POA: Diagnosis not present

## 2015-01-09 DIAGNOSIS — N186 End stage renal disease: Secondary | ICD-10-CM | POA: Diagnosis not present

## 2015-01-09 DIAGNOSIS — D689 Coagulation defect, unspecified: Secondary | ICD-10-CM | POA: Diagnosis not present

## 2015-01-09 DIAGNOSIS — D631 Anemia in chronic kidney disease: Secondary | ICD-10-CM | POA: Diagnosis not present

## 2015-01-11 DIAGNOSIS — D631 Anemia in chronic kidney disease: Secondary | ICD-10-CM | POA: Diagnosis not present

## 2015-01-11 DIAGNOSIS — N186 End stage renal disease: Secondary | ICD-10-CM | POA: Diagnosis not present

## 2015-01-11 DIAGNOSIS — D509 Iron deficiency anemia, unspecified: Secondary | ICD-10-CM | POA: Diagnosis not present

## 2015-01-11 DIAGNOSIS — D689 Coagulation defect, unspecified: Secondary | ICD-10-CM | POA: Diagnosis not present

## 2015-01-13 DIAGNOSIS — D631 Anemia in chronic kidney disease: Secondary | ICD-10-CM | POA: Diagnosis not present

## 2015-01-13 DIAGNOSIS — N186 End stage renal disease: Secondary | ICD-10-CM | POA: Diagnosis not present

## 2015-01-13 DIAGNOSIS — D509 Iron deficiency anemia, unspecified: Secondary | ICD-10-CM | POA: Diagnosis not present

## 2015-01-13 DIAGNOSIS — Z992 Dependence on renal dialysis: Secondary | ICD-10-CM | POA: Diagnosis not present

## 2015-01-13 DIAGNOSIS — N2889 Other specified disorders of kidney and ureter: Secondary | ICD-10-CM | POA: Diagnosis not present

## 2015-01-13 DIAGNOSIS — D689 Coagulation defect, unspecified: Secondary | ICD-10-CM | POA: Diagnosis not present

## 2015-01-15 DIAGNOSIS — D508 Other iron deficiency anemias: Secondary | ICD-10-CM | POA: Diagnosis not present

## 2015-01-15 DIAGNOSIS — D631 Anemia in chronic kidney disease: Secondary | ICD-10-CM | POA: Diagnosis not present

## 2015-01-15 DIAGNOSIS — D689 Coagulation defect, unspecified: Secondary | ICD-10-CM | POA: Diagnosis not present

## 2015-01-15 DIAGNOSIS — N186 End stage renal disease: Secondary | ICD-10-CM | POA: Diagnosis not present

## 2015-01-18 DIAGNOSIS — N186 End stage renal disease: Secondary | ICD-10-CM | POA: Diagnosis not present

## 2015-01-18 DIAGNOSIS — D689 Coagulation defect, unspecified: Secondary | ICD-10-CM | POA: Diagnosis not present

## 2015-01-18 DIAGNOSIS — D508 Other iron deficiency anemias: Secondary | ICD-10-CM | POA: Diagnosis not present

## 2015-01-18 DIAGNOSIS — D631 Anemia in chronic kidney disease: Secondary | ICD-10-CM | POA: Diagnosis not present

## 2015-01-20 DIAGNOSIS — D631 Anemia in chronic kidney disease: Secondary | ICD-10-CM | POA: Diagnosis not present

## 2015-01-20 DIAGNOSIS — D689 Coagulation defect, unspecified: Secondary | ICD-10-CM | POA: Diagnosis not present

## 2015-01-20 DIAGNOSIS — N186 End stage renal disease: Secondary | ICD-10-CM | POA: Diagnosis not present

## 2015-01-20 DIAGNOSIS — D508 Other iron deficiency anemias: Secondary | ICD-10-CM | POA: Diagnosis not present

## 2015-01-22 DIAGNOSIS — D508 Other iron deficiency anemias: Secondary | ICD-10-CM | POA: Diagnosis not present

## 2015-01-22 DIAGNOSIS — N186 End stage renal disease: Secondary | ICD-10-CM | POA: Diagnosis not present

## 2015-01-22 DIAGNOSIS — D631 Anemia in chronic kidney disease: Secondary | ICD-10-CM | POA: Diagnosis not present

## 2015-01-22 DIAGNOSIS — D689 Coagulation defect, unspecified: Secondary | ICD-10-CM | POA: Diagnosis not present

## 2015-01-25 ENCOUNTER — Ambulatory Visit: Payer: Medicaid Other | Admitting: Surgery

## 2015-01-25 DIAGNOSIS — D508 Other iron deficiency anemias: Secondary | ICD-10-CM | POA: Diagnosis not present

## 2015-01-25 DIAGNOSIS — D631 Anemia in chronic kidney disease: Secondary | ICD-10-CM | POA: Diagnosis not present

## 2015-01-25 DIAGNOSIS — D689 Coagulation defect, unspecified: Secondary | ICD-10-CM | POA: Diagnosis not present

## 2015-01-25 DIAGNOSIS — N186 End stage renal disease: Secondary | ICD-10-CM | POA: Diagnosis not present

## 2015-01-27 DIAGNOSIS — D689 Coagulation defect, unspecified: Secondary | ICD-10-CM | POA: Diagnosis not present

## 2015-01-27 DIAGNOSIS — D631 Anemia in chronic kidney disease: Secondary | ICD-10-CM | POA: Diagnosis not present

## 2015-01-27 DIAGNOSIS — D508 Other iron deficiency anemias: Secondary | ICD-10-CM | POA: Diagnosis not present

## 2015-01-27 DIAGNOSIS — N186 End stage renal disease: Secondary | ICD-10-CM | POA: Diagnosis not present

## 2015-01-29 DIAGNOSIS — D631 Anemia in chronic kidney disease: Secondary | ICD-10-CM | POA: Diagnosis not present

## 2015-01-29 DIAGNOSIS — N186 End stage renal disease: Secondary | ICD-10-CM | POA: Diagnosis not present

## 2015-01-29 DIAGNOSIS — D689 Coagulation defect, unspecified: Secondary | ICD-10-CM | POA: Diagnosis not present

## 2015-01-29 DIAGNOSIS — D508 Other iron deficiency anemias: Secondary | ICD-10-CM | POA: Diagnosis not present

## 2015-02-01 DIAGNOSIS — D689 Coagulation defect, unspecified: Secondary | ICD-10-CM | POA: Diagnosis not present

## 2015-02-01 DIAGNOSIS — N186 End stage renal disease: Secondary | ICD-10-CM | POA: Diagnosis not present

## 2015-02-01 DIAGNOSIS — D631 Anemia in chronic kidney disease: Secondary | ICD-10-CM | POA: Diagnosis not present

## 2015-02-01 DIAGNOSIS — D508 Other iron deficiency anemias: Secondary | ICD-10-CM | POA: Diagnosis not present

## 2015-02-03 ENCOUNTER — Encounter: Payer: Self-pay | Admitting: Vascular Surgery

## 2015-02-03 DIAGNOSIS — D689 Coagulation defect, unspecified: Secondary | ICD-10-CM | POA: Diagnosis not present

## 2015-02-03 DIAGNOSIS — N186 End stage renal disease: Secondary | ICD-10-CM | POA: Diagnosis not present

## 2015-02-03 DIAGNOSIS — D508 Other iron deficiency anemias: Secondary | ICD-10-CM | POA: Diagnosis not present

## 2015-02-03 DIAGNOSIS — D631 Anemia in chronic kidney disease: Secondary | ICD-10-CM | POA: Diagnosis not present

## 2015-02-05 DIAGNOSIS — D631 Anemia in chronic kidney disease: Secondary | ICD-10-CM | POA: Diagnosis not present

## 2015-02-05 DIAGNOSIS — N186 End stage renal disease: Secondary | ICD-10-CM | POA: Diagnosis not present

## 2015-02-05 DIAGNOSIS — D508 Other iron deficiency anemias: Secondary | ICD-10-CM | POA: Diagnosis not present

## 2015-02-05 DIAGNOSIS — D689 Coagulation defect, unspecified: Secondary | ICD-10-CM | POA: Diagnosis not present

## 2015-02-08 DIAGNOSIS — D689 Coagulation defect, unspecified: Secondary | ICD-10-CM | POA: Diagnosis not present

## 2015-02-08 DIAGNOSIS — D508 Other iron deficiency anemias: Secondary | ICD-10-CM | POA: Diagnosis not present

## 2015-02-08 DIAGNOSIS — D631 Anemia in chronic kidney disease: Secondary | ICD-10-CM | POA: Diagnosis not present

## 2015-02-08 DIAGNOSIS — N186 End stage renal disease: Secondary | ICD-10-CM | POA: Diagnosis not present

## 2015-02-09 ENCOUNTER — Encounter: Payer: Self-pay | Admitting: Vascular Surgery

## 2015-02-09 ENCOUNTER — Ambulatory Visit (INDEPENDENT_AMBULATORY_CARE_PROVIDER_SITE_OTHER): Payer: Medicare Other | Admitting: Vascular Surgery

## 2015-02-09 VITALS — BP 118/72 | HR 72 | Resp 18 | Ht 71.0 in | Wt 178.1 lb

## 2015-02-09 DIAGNOSIS — N186 End stage renal disease: Secondary | ICD-10-CM

## 2015-02-09 NOTE — Progress Notes (Signed)
[facility] [place]  Patient name: Jessica Clarke MRN: [id number] DOB: January 03, 1951 Sex: female  REASON FOR VISIT: poorly healing wound left arm fistula, referred by Dr. Joelyn Oms  HPI: Jessica Clarke is a 64 y.o. female who presents for evaluation of her left arm brachiocephalic fistula originally placed on 01/18/2012. She reports a greater than 3 month history of scabbing and pain with at the middle segment of her fistula. She denies any changes in her symptoms. They have remained constant. She reports that at dialysis the same segment is continuously cannulated. She denies any bleeding problems or issues with dialysis. She dialyzes on Mondays, Wednesdays, and Fridays.   Past Medical History  Diagnosis Date  . Obesity   . Uterine polyp   . Cervical dysplasia   . Hypertension   . Chronic kidney disease   . Constipation   . Gout   . Constipation   . Abnormal Pap smear     ASCUS ?HGSIL    Family History  Problem Relation Age of Onset  . Colon cancer Neg Hx   . Esophageal cancer    . Pancreatic cancer    . Heart disease Mother   . Hypertension Mother   . Breast cancer Sister   . Esophageal cancer Sister   . Cancer Sister   . Deep vein thrombosis Sister   . Diabetes Sister   . Hypertension Sister   . Hypertension Daughter     SOCIAL HISTORY: Social History  Substance Use Topics  . Smoking status: Current Every Day Smoker -- 1.00 packs/day for 30 years    Types: Cigarettes  . Smokeless tobacco: Never Used     Comment: pt states that she is still trying  . Alcohol Use: 3.6 oz/week    6 Cans of beer per week    No Known Allergies  Current Outpatient Prescriptions  Medication Sig Dispense Refill  . allopurinol (ZYLOPRIM) 100 MG tablet Take 100 mg by mouth daily.      Marland Kitchen aspirin EC 325 MG tablet Take 1 tablet (325 mg total) by mouth daily. 30 tablet 0  . atenolol (TENORMIN) 100 MG tablet Take 1 tablet (100 mg total) by mouth daily. 60 tablet 0  .  atorvastatin (LIPITOR) 10 MG tablet Take 1 tablet (10 mg total) by mouth daily. Can use any approved statin same dose 30 tablet 0  . calcitRIOL (ROCALTROL) 0.25 MCG capsule Take 1 capsule by mouth daily.    . calcium acetate (PHOSLO) 667 MG capsule Take by mouth 3 (three) times daily with meals.    . cloNIDine (CATAPRES) 0.2 MG tablet Take 1 tablet (0.2 mg total) by mouth 3 (three) times daily. 90 tablet 0  . folic acid (FOLVITE) 1 MG tablet Take 1 tablet (1 mg total) by mouth daily. 30 tablet 0  . furosemide (LASIX) 40 MG tablet Take 40 mg by mouth 2 (two) times daily.      . Multiple Vitamin (MULTIVITAMIN WITH MINERALS) TABS tablet Take 1 tablet by mouth daily. 30 tablet 0  . sodium bicarbonate 650 MG tablet Take 650 mg by mouth 3 (three) times daily.    Marland Kitchen thiamine 100 MG tablet Take 1 tablet (100 mg total) by mouth daily. 30 tablet 0   Current Facility-Administered Medications  Medication Dose Route Frequency Provider Last Rate Last Dose  . 0.9 %  sodium chloride infusion  500 mL Intravenous Continuous Inda Castle, MD        REVIEW OF SYSTEMS:  [  X] denotes positive finding, '[ ]'$  denotes negative finding Cardiac  Comments:  Chest pain or chest pressure:    Shortness of breath upon exertion:    Short of breath when lying flat:    Irregular heart rhythm:        Vascular    Pain in calf, thigh, or hip brought on by ambulation:    Pain in feet at night that wakes you up from your sleep:     Blood clot in your veins:    Leg swelling:         Pulmonary    Oxygen at home:    Productive cough:     Wheezing:         Neurologic    Sudden weakness in arms or legs:     Sudden numbness in arms or legs:     Sudden onset of difficulty speaking or slurred speech:    Temporary loss of vision in one eye:     Problems with dizziness:         Gastrointestinal    Blood in stool:     Vomited blood:         Genitourinary    Burning when urinating:     Blood in urine:        Psychiatric     Major depression:         Hematologic    Bleeding problems:    Problems with blood clotting too easily:        Skin    Rashes or ulcers:        Constitutional    Fever or chills:      PHYSICAL EXAM: Filed Vitals:   02/09/15 1517  BP: 118/72  Pulse: 72  Resp: 18  Height: '5\' 11"'$  (1.803 m)  Weight: 178 lb 1.6 oz (80.786 kg)  SpO2: 98%    GENERAL: The patient is a well-nourished female, in no acute distress. The vital signs are documented above. VASCULAR: 2+ left radial pulse. Easily palpable thrill in left upper arm fistula. Area of red scarring at the middle segment of the fistula. No ulcerations seen, no aneurysmal changes, no scabs. No drainage.  PULMONARY:  Non labored breathing.  MUSCULOSKELETAL: There are no major deformities or cyanosis. NEUROLOGIC: No focal weakness or paresthesias are detected. PSYCHIATRIC: The patient has a normal affect.   MEDICAL ISSUES:  ESRD Poorly healing wounds left upper arm fistula  The patient has an area of scarring at the middle segment of her left upper arm fistula. This segment does not look infected. There are no scabs, ulcerations or aneurysmal changes. She has denied any issues with bleeding. She reports that the same segment is being continuously cannulated.  Recommend avoiding cannulation of the affected segment until her wounds have healed. Her fistula may be stuck above and below the affected segment. Advised her to follow up if the wounds are not healing or if she develops any bleeding issues. She will follow up on an as-needed basis.  Virgina Jock, PA-C [facility]   agree with above assessment  the area overlying the left brachial-cephalic AV fistula appears that it will heal satisfactorily if not traumatize No history of bleeding   Would avoid puncturing this area and keep area clean and covered  And we will be happy to see her back at any time if there is any bleeding or worsening of  the situation

## 2015-02-10 ENCOUNTER — Ambulatory Visit: Payer: Medicaid Other | Admitting: Surgery

## 2015-02-10 DIAGNOSIS — D689 Coagulation defect, unspecified: Secondary | ICD-10-CM | POA: Diagnosis not present

## 2015-02-10 DIAGNOSIS — D508 Other iron deficiency anemias: Secondary | ICD-10-CM | POA: Diagnosis not present

## 2015-02-10 DIAGNOSIS — D631 Anemia in chronic kidney disease: Secondary | ICD-10-CM | POA: Diagnosis not present

## 2015-02-10 DIAGNOSIS — N186 End stage renal disease: Secondary | ICD-10-CM | POA: Diagnosis not present

## 2015-02-12 DIAGNOSIS — N186 End stage renal disease: Secondary | ICD-10-CM | POA: Diagnosis not present

## 2015-02-12 DIAGNOSIS — D689 Coagulation defect, unspecified: Secondary | ICD-10-CM | POA: Diagnosis not present

## 2015-02-12 DIAGNOSIS — D508 Other iron deficiency anemias: Secondary | ICD-10-CM | POA: Diagnosis not present

## 2015-02-12 DIAGNOSIS — D631 Anemia in chronic kidney disease: Secondary | ICD-10-CM | POA: Diagnosis not present

## 2015-02-13 DIAGNOSIS — N2889 Other specified disorders of kidney and ureter: Secondary | ICD-10-CM | POA: Diagnosis not present

## 2015-02-13 DIAGNOSIS — N186 End stage renal disease: Secondary | ICD-10-CM | POA: Diagnosis not present

## 2015-02-13 DIAGNOSIS — Z992 Dependence on renal dialysis: Secondary | ICD-10-CM | POA: Diagnosis not present

## 2015-02-15 DIAGNOSIS — D508 Other iron deficiency anemias: Secondary | ICD-10-CM | POA: Diagnosis not present

## 2015-02-15 DIAGNOSIS — D689 Coagulation defect, unspecified: Secondary | ICD-10-CM | POA: Diagnosis not present

## 2015-02-15 DIAGNOSIS — N186 End stage renal disease: Secondary | ICD-10-CM | POA: Diagnosis not present

## 2015-02-15 DIAGNOSIS — D631 Anemia in chronic kidney disease: Secondary | ICD-10-CM | POA: Diagnosis not present

## 2015-02-17 DIAGNOSIS — D508 Other iron deficiency anemias: Secondary | ICD-10-CM | POA: Diagnosis not present

## 2015-02-17 DIAGNOSIS — N186 End stage renal disease: Secondary | ICD-10-CM | POA: Diagnosis not present

## 2015-02-17 DIAGNOSIS — D631 Anemia in chronic kidney disease: Secondary | ICD-10-CM | POA: Diagnosis not present

## 2015-02-17 DIAGNOSIS — D689 Coagulation defect, unspecified: Secondary | ICD-10-CM | POA: Diagnosis not present

## 2015-02-19 DIAGNOSIS — D631 Anemia in chronic kidney disease: Secondary | ICD-10-CM | POA: Diagnosis not present

## 2015-02-19 DIAGNOSIS — D508 Other iron deficiency anemias: Secondary | ICD-10-CM | POA: Diagnosis not present

## 2015-02-19 DIAGNOSIS — N186 End stage renal disease: Secondary | ICD-10-CM | POA: Diagnosis not present

## 2015-02-19 DIAGNOSIS — D689 Coagulation defect, unspecified: Secondary | ICD-10-CM | POA: Diagnosis not present

## 2015-02-22 DIAGNOSIS — D508 Other iron deficiency anemias: Secondary | ICD-10-CM | POA: Diagnosis not present

## 2015-02-22 DIAGNOSIS — N186 End stage renal disease: Secondary | ICD-10-CM | POA: Diagnosis not present

## 2015-02-22 DIAGNOSIS — D631 Anemia in chronic kidney disease: Secondary | ICD-10-CM | POA: Diagnosis not present

## 2015-02-22 DIAGNOSIS — D689 Coagulation defect, unspecified: Secondary | ICD-10-CM | POA: Diagnosis not present

## 2015-02-24 DIAGNOSIS — D689 Coagulation defect, unspecified: Secondary | ICD-10-CM | POA: Diagnosis not present

## 2015-02-24 DIAGNOSIS — D508 Other iron deficiency anemias: Secondary | ICD-10-CM | POA: Diagnosis not present

## 2015-02-24 DIAGNOSIS — D631 Anemia in chronic kidney disease: Secondary | ICD-10-CM | POA: Diagnosis not present

## 2015-02-24 DIAGNOSIS — N186 End stage renal disease: Secondary | ICD-10-CM | POA: Diagnosis not present

## 2015-02-26 DIAGNOSIS — N186 End stage renal disease: Secondary | ICD-10-CM | POA: Diagnosis not present

## 2015-02-26 DIAGNOSIS — D508 Other iron deficiency anemias: Secondary | ICD-10-CM | POA: Diagnosis not present

## 2015-02-26 DIAGNOSIS — D631 Anemia in chronic kidney disease: Secondary | ICD-10-CM | POA: Diagnosis not present

## 2015-02-26 DIAGNOSIS — D689 Coagulation defect, unspecified: Secondary | ICD-10-CM | POA: Diagnosis not present

## 2015-03-01 DIAGNOSIS — N186 End stage renal disease: Secondary | ICD-10-CM | POA: Diagnosis not present

## 2015-03-01 DIAGNOSIS — D508 Other iron deficiency anemias: Secondary | ICD-10-CM | POA: Diagnosis not present

## 2015-03-01 DIAGNOSIS — D631 Anemia in chronic kidney disease: Secondary | ICD-10-CM | POA: Diagnosis not present

## 2015-03-01 DIAGNOSIS — D689 Coagulation defect, unspecified: Secondary | ICD-10-CM | POA: Diagnosis not present

## 2015-03-03 DIAGNOSIS — D508 Other iron deficiency anemias: Secondary | ICD-10-CM | POA: Diagnosis not present

## 2015-03-03 DIAGNOSIS — N186 End stage renal disease: Secondary | ICD-10-CM | POA: Diagnosis not present

## 2015-03-03 DIAGNOSIS — D689 Coagulation defect, unspecified: Secondary | ICD-10-CM | POA: Diagnosis not present

## 2015-03-03 DIAGNOSIS — D631 Anemia in chronic kidney disease: Secondary | ICD-10-CM | POA: Diagnosis not present

## 2015-03-05 DIAGNOSIS — D508 Other iron deficiency anemias: Secondary | ICD-10-CM | POA: Diagnosis not present

## 2015-03-05 DIAGNOSIS — D631 Anemia in chronic kidney disease: Secondary | ICD-10-CM | POA: Diagnosis not present

## 2015-03-05 DIAGNOSIS — N186 End stage renal disease: Secondary | ICD-10-CM | POA: Diagnosis not present

## 2015-03-05 DIAGNOSIS — D689 Coagulation defect, unspecified: Secondary | ICD-10-CM | POA: Diagnosis not present

## 2015-03-08 DIAGNOSIS — D689 Coagulation defect, unspecified: Secondary | ICD-10-CM | POA: Diagnosis not present

## 2015-03-08 DIAGNOSIS — D631 Anemia in chronic kidney disease: Secondary | ICD-10-CM | POA: Diagnosis not present

## 2015-03-08 DIAGNOSIS — N186 End stage renal disease: Secondary | ICD-10-CM | POA: Diagnosis not present

## 2015-03-08 DIAGNOSIS — D508 Other iron deficiency anemias: Secondary | ICD-10-CM | POA: Diagnosis not present

## 2015-03-10 DIAGNOSIS — D689 Coagulation defect, unspecified: Secondary | ICD-10-CM | POA: Diagnosis not present

## 2015-03-10 DIAGNOSIS — D631 Anemia in chronic kidney disease: Secondary | ICD-10-CM | POA: Diagnosis not present

## 2015-03-10 DIAGNOSIS — D508 Other iron deficiency anemias: Secondary | ICD-10-CM | POA: Diagnosis not present

## 2015-03-10 DIAGNOSIS — N186 End stage renal disease: Secondary | ICD-10-CM | POA: Diagnosis not present

## 2015-03-12 DIAGNOSIS — D508 Other iron deficiency anemias: Secondary | ICD-10-CM | POA: Diagnosis not present

## 2015-03-12 DIAGNOSIS — D689 Coagulation defect, unspecified: Secondary | ICD-10-CM | POA: Diagnosis not present

## 2015-03-12 DIAGNOSIS — N186 End stage renal disease: Secondary | ICD-10-CM | POA: Diagnosis not present

## 2015-03-12 DIAGNOSIS — D631 Anemia in chronic kidney disease: Secondary | ICD-10-CM | POA: Diagnosis not present

## 2015-03-15 DIAGNOSIS — D508 Other iron deficiency anemias: Secondary | ICD-10-CM | POA: Diagnosis not present

## 2015-03-15 DIAGNOSIS — D689 Coagulation defect, unspecified: Secondary | ICD-10-CM | POA: Diagnosis not present

## 2015-03-15 DIAGNOSIS — N186 End stage renal disease: Secondary | ICD-10-CM | POA: Diagnosis not present

## 2015-03-15 DIAGNOSIS — D631 Anemia in chronic kidney disease: Secondary | ICD-10-CM | POA: Diagnosis not present

## 2015-03-16 DIAGNOSIS — Z992 Dependence on renal dialysis: Secondary | ICD-10-CM | POA: Diagnosis not present

## 2015-03-16 DIAGNOSIS — N186 End stage renal disease: Secondary | ICD-10-CM | POA: Diagnosis not present

## 2015-03-16 DIAGNOSIS — N2889 Other specified disorders of kidney and ureter: Secondary | ICD-10-CM | POA: Diagnosis not present

## 2015-03-17 DIAGNOSIS — D631 Anemia in chronic kidney disease: Secondary | ICD-10-CM | POA: Diagnosis not present

## 2015-03-17 DIAGNOSIS — D689 Coagulation defect, unspecified: Secondary | ICD-10-CM | POA: Diagnosis not present

## 2015-03-17 DIAGNOSIS — D508 Other iron deficiency anemias: Secondary | ICD-10-CM | POA: Diagnosis not present

## 2015-03-17 DIAGNOSIS — N186 End stage renal disease: Secondary | ICD-10-CM | POA: Diagnosis not present

## 2015-03-19 DIAGNOSIS — D689 Coagulation defect, unspecified: Secondary | ICD-10-CM | POA: Diagnosis not present

## 2015-03-19 DIAGNOSIS — N186 End stage renal disease: Secondary | ICD-10-CM | POA: Diagnosis not present

## 2015-03-19 DIAGNOSIS — D508 Other iron deficiency anemias: Secondary | ICD-10-CM | POA: Diagnosis not present

## 2015-03-19 DIAGNOSIS — D631 Anemia in chronic kidney disease: Secondary | ICD-10-CM | POA: Diagnosis not present

## 2015-03-22 DIAGNOSIS — D631 Anemia in chronic kidney disease: Secondary | ICD-10-CM | POA: Diagnosis not present

## 2015-03-22 DIAGNOSIS — N186 End stage renal disease: Secondary | ICD-10-CM | POA: Diagnosis not present

## 2015-03-22 DIAGNOSIS — D689 Coagulation defect, unspecified: Secondary | ICD-10-CM | POA: Diagnosis not present

## 2015-03-22 DIAGNOSIS — D508 Other iron deficiency anemias: Secondary | ICD-10-CM | POA: Diagnosis not present

## 2015-03-24 DIAGNOSIS — D508 Other iron deficiency anemias: Secondary | ICD-10-CM | POA: Diagnosis not present

## 2015-03-24 DIAGNOSIS — D631 Anemia in chronic kidney disease: Secondary | ICD-10-CM | POA: Diagnosis not present

## 2015-03-24 DIAGNOSIS — N186 End stage renal disease: Secondary | ICD-10-CM | POA: Diagnosis not present

## 2015-03-24 DIAGNOSIS — D689 Coagulation defect, unspecified: Secondary | ICD-10-CM | POA: Diagnosis not present

## 2015-03-26 DIAGNOSIS — D631 Anemia in chronic kidney disease: Secondary | ICD-10-CM | POA: Diagnosis not present

## 2015-03-26 DIAGNOSIS — D689 Coagulation defect, unspecified: Secondary | ICD-10-CM | POA: Diagnosis not present

## 2015-03-26 DIAGNOSIS — D508 Other iron deficiency anemias: Secondary | ICD-10-CM | POA: Diagnosis not present

## 2015-03-26 DIAGNOSIS — N186 End stage renal disease: Secondary | ICD-10-CM | POA: Diagnosis not present

## 2015-03-29 DIAGNOSIS — D508 Other iron deficiency anemias: Secondary | ICD-10-CM | POA: Diagnosis not present

## 2015-03-29 DIAGNOSIS — D689 Coagulation defect, unspecified: Secondary | ICD-10-CM | POA: Diagnosis not present

## 2015-03-29 DIAGNOSIS — N186 End stage renal disease: Secondary | ICD-10-CM | POA: Diagnosis not present

## 2015-03-29 DIAGNOSIS — D631 Anemia in chronic kidney disease: Secondary | ICD-10-CM | POA: Diagnosis not present

## 2015-03-31 DIAGNOSIS — D631 Anemia in chronic kidney disease: Secondary | ICD-10-CM | POA: Diagnosis not present

## 2015-03-31 DIAGNOSIS — D508 Other iron deficiency anemias: Secondary | ICD-10-CM | POA: Diagnosis not present

## 2015-03-31 DIAGNOSIS — D689 Coagulation defect, unspecified: Secondary | ICD-10-CM | POA: Diagnosis not present

## 2015-03-31 DIAGNOSIS — N186 End stage renal disease: Secondary | ICD-10-CM | POA: Diagnosis not present

## 2015-04-02 DIAGNOSIS — D508 Other iron deficiency anemias: Secondary | ICD-10-CM | POA: Diagnosis not present

## 2015-04-02 DIAGNOSIS — D631 Anemia in chronic kidney disease: Secondary | ICD-10-CM | POA: Diagnosis not present

## 2015-04-02 DIAGNOSIS — D689 Coagulation defect, unspecified: Secondary | ICD-10-CM | POA: Diagnosis not present

## 2015-04-02 DIAGNOSIS — N186 End stage renal disease: Secondary | ICD-10-CM | POA: Diagnosis not present

## 2015-04-05 DIAGNOSIS — D631 Anemia in chronic kidney disease: Secondary | ICD-10-CM | POA: Diagnosis not present

## 2015-04-05 DIAGNOSIS — D689 Coagulation defect, unspecified: Secondary | ICD-10-CM | POA: Diagnosis not present

## 2015-04-05 DIAGNOSIS — D508 Other iron deficiency anemias: Secondary | ICD-10-CM | POA: Diagnosis not present

## 2015-04-05 DIAGNOSIS — N186 End stage renal disease: Secondary | ICD-10-CM | POA: Diagnosis not present

## 2015-04-07 DIAGNOSIS — D689 Coagulation defect, unspecified: Secondary | ICD-10-CM | POA: Diagnosis not present

## 2015-04-07 DIAGNOSIS — D631 Anemia in chronic kidney disease: Secondary | ICD-10-CM | POA: Diagnosis not present

## 2015-04-07 DIAGNOSIS — D508 Other iron deficiency anemias: Secondary | ICD-10-CM | POA: Diagnosis not present

## 2015-04-07 DIAGNOSIS — N186 End stage renal disease: Secondary | ICD-10-CM | POA: Diagnosis not present

## 2015-04-09 DIAGNOSIS — D689 Coagulation defect, unspecified: Secondary | ICD-10-CM | POA: Diagnosis not present

## 2015-04-09 DIAGNOSIS — D508 Other iron deficiency anemias: Secondary | ICD-10-CM | POA: Diagnosis not present

## 2015-04-09 DIAGNOSIS — D631 Anemia in chronic kidney disease: Secondary | ICD-10-CM | POA: Diagnosis not present

## 2015-04-09 DIAGNOSIS — N186 End stage renal disease: Secondary | ICD-10-CM | POA: Diagnosis not present

## 2015-04-12 DIAGNOSIS — D631 Anemia in chronic kidney disease: Secondary | ICD-10-CM | POA: Diagnosis not present

## 2015-04-12 DIAGNOSIS — D508 Other iron deficiency anemias: Secondary | ICD-10-CM | POA: Diagnosis not present

## 2015-04-12 DIAGNOSIS — N186 End stage renal disease: Secondary | ICD-10-CM | POA: Diagnosis not present

## 2015-04-12 DIAGNOSIS — D689 Coagulation defect, unspecified: Secondary | ICD-10-CM | POA: Diagnosis not present

## 2015-04-13 DIAGNOSIS — N2889 Other specified disorders of kidney and ureter: Secondary | ICD-10-CM | POA: Diagnosis not present

## 2015-04-13 DIAGNOSIS — N186 End stage renal disease: Secondary | ICD-10-CM | POA: Diagnosis not present

## 2015-04-13 DIAGNOSIS — Z992 Dependence on renal dialysis: Secondary | ICD-10-CM | POA: Diagnosis not present

## 2015-04-14 ENCOUNTER — Encounter: Payer: Self-pay | Admitting: Gastroenterology

## 2015-04-14 DIAGNOSIS — N186 End stage renal disease: Secondary | ICD-10-CM | POA: Diagnosis not present

## 2015-04-14 DIAGNOSIS — D689 Coagulation defect, unspecified: Secondary | ICD-10-CM | POA: Diagnosis not present

## 2015-04-14 DIAGNOSIS — R51 Headache: Secondary | ICD-10-CM | POA: Diagnosis not present

## 2015-04-14 DIAGNOSIS — D508 Other iron deficiency anemias: Secondary | ICD-10-CM | POA: Diagnosis not present

## 2015-04-16 DIAGNOSIS — N186 End stage renal disease: Secondary | ICD-10-CM | POA: Diagnosis not present

## 2015-04-16 DIAGNOSIS — R51 Headache: Secondary | ICD-10-CM | POA: Diagnosis not present

## 2015-04-16 DIAGNOSIS — D508 Other iron deficiency anemias: Secondary | ICD-10-CM | POA: Diagnosis not present

## 2015-04-16 DIAGNOSIS — D689 Coagulation defect, unspecified: Secondary | ICD-10-CM | POA: Diagnosis not present

## 2015-04-19 DIAGNOSIS — R51 Headache: Secondary | ICD-10-CM | POA: Diagnosis not present

## 2015-04-19 DIAGNOSIS — D689 Coagulation defect, unspecified: Secondary | ICD-10-CM | POA: Diagnosis not present

## 2015-04-19 DIAGNOSIS — N186 End stage renal disease: Secondary | ICD-10-CM | POA: Diagnosis not present

## 2015-04-19 DIAGNOSIS — D508 Other iron deficiency anemias: Secondary | ICD-10-CM | POA: Diagnosis not present

## 2015-04-21 DIAGNOSIS — R51 Headache: Secondary | ICD-10-CM | POA: Diagnosis not present

## 2015-04-21 DIAGNOSIS — D689 Coagulation defect, unspecified: Secondary | ICD-10-CM | POA: Diagnosis not present

## 2015-04-21 DIAGNOSIS — D508 Other iron deficiency anemias: Secondary | ICD-10-CM | POA: Diagnosis not present

## 2015-04-21 DIAGNOSIS — N186 End stage renal disease: Secondary | ICD-10-CM | POA: Diagnosis not present

## 2015-04-23 DIAGNOSIS — D508 Other iron deficiency anemias: Secondary | ICD-10-CM | POA: Diagnosis not present

## 2015-04-23 DIAGNOSIS — D689 Coagulation defect, unspecified: Secondary | ICD-10-CM | POA: Diagnosis not present

## 2015-04-23 DIAGNOSIS — R51 Headache: Secondary | ICD-10-CM | POA: Diagnosis not present

## 2015-04-23 DIAGNOSIS — N186 End stage renal disease: Secondary | ICD-10-CM | POA: Diagnosis not present

## 2015-04-28 DIAGNOSIS — D508 Other iron deficiency anemias: Secondary | ICD-10-CM | POA: Diagnosis not present

## 2015-04-28 DIAGNOSIS — D689 Coagulation defect, unspecified: Secondary | ICD-10-CM | POA: Diagnosis not present

## 2015-04-28 DIAGNOSIS — N186 End stage renal disease: Secondary | ICD-10-CM | POA: Diagnosis not present

## 2015-04-28 DIAGNOSIS — R51 Headache: Secondary | ICD-10-CM | POA: Diagnosis not present

## 2015-04-30 DIAGNOSIS — R51 Headache: Secondary | ICD-10-CM | POA: Diagnosis not present

## 2015-04-30 DIAGNOSIS — D508 Other iron deficiency anemias: Secondary | ICD-10-CM | POA: Diagnosis not present

## 2015-04-30 DIAGNOSIS — N186 End stage renal disease: Secondary | ICD-10-CM | POA: Diagnosis not present

## 2015-04-30 DIAGNOSIS — D689 Coagulation defect, unspecified: Secondary | ICD-10-CM | POA: Diagnosis not present

## 2015-05-05 DIAGNOSIS — N186 End stage renal disease: Secondary | ICD-10-CM | POA: Diagnosis not present

## 2015-05-05 DIAGNOSIS — D689 Coagulation defect, unspecified: Secondary | ICD-10-CM | POA: Diagnosis not present

## 2015-05-05 DIAGNOSIS — R51 Headache: Secondary | ICD-10-CM | POA: Diagnosis not present

## 2015-05-05 DIAGNOSIS — D508 Other iron deficiency anemias: Secondary | ICD-10-CM | POA: Diagnosis not present

## 2015-05-07 DIAGNOSIS — N186 End stage renal disease: Secondary | ICD-10-CM | POA: Diagnosis not present

## 2015-05-07 DIAGNOSIS — D508 Other iron deficiency anemias: Secondary | ICD-10-CM | POA: Diagnosis not present

## 2015-05-07 DIAGNOSIS — D689 Coagulation defect, unspecified: Secondary | ICD-10-CM | POA: Diagnosis not present

## 2015-05-07 DIAGNOSIS — R51 Headache: Secondary | ICD-10-CM | POA: Diagnosis not present

## 2015-05-10 DIAGNOSIS — N186 End stage renal disease: Secondary | ICD-10-CM | POA: Diagnosis not present

## 2015-05-10 DIAGNOSIS — D508 Other iron deficiency anemias: Secondary | ICD-10-CM | POA: Diagnosis not present

## 2015-05-10 DIAGNOSIS — R51 Headache: Secondary | ICD-10-CM | POA: Diagnosis not present

## 2015-05-10 DIAGNOSIS — D689 Coagulation defect, unspecified: Secondary | ICD-10-CM | POA: Diagnosis not present

## 2015-05-12 DIAGNOSIS — R51 Headache: Secondary | ICD-10-CM | POA: Diagnosis not present

## 2015-05-12 DIAGNOSIS — D508 Other iron deficiency anemias: Secondary | ICD-10-CM | POA: Diagnosis not present

## 2015-05-12 DIAGNOSIS — D689 Coagulation defect, unspecified: Secondary | ICD-10-CM | POA: Diagnosis not present

## 2015-05-12 DIAGNOSIS — N186 End stage renal disease: Secondary | ICD-10-CM | POA: Diagnosis not present

## 2015-05-14 DIAGNOSIS — N2889 Other specified disorders of kidney and ureter: Secondary | ICD-10-CM | POA: Diagnosis not present

## 2015-05-14 DIAGNOSIS — N186 End stage renal disease: Secondary | ICD-10-CM | POA: Diagnosis not present

## 2015-05-14 DIAGNOSIS — D689 Coagulation defect, unspecified: Secondary | ICD-10-CM | POA: Diagnosis not present

## 2015-05-14 DIAGNOSIS — R51 Headache: Secondary | ICD-10-CM | POA: Diagnosis not present

## 2015-05-14 DIAGNOSIS — D508 Other iron deficiency anemias: Secondary | ICD-10-CM | POA: Diagnosis not present

## 2015-05-14 DIAGNOSIS — Z992 Dependence on renal dialysis: Secondary | ICD-10-CM | POA: Diagnosis not present

## 2015-05-17 DIAGNOSIS — N186 End stage renal disease: Secondary | ICD-10-CM | POA: Diagnosis not present

## 2015-05-17 DIAGNOSIS — D508 Other iron deficiency anemias: Secondary | ICD-10-CM | POA: Diagnosis not present

## 2015-05-17 DIAGNOSIS — D631 Anemia in chronic kidney disease: Secondary | ICD-10-CM | POA: Diagnosis not present

## 2015-05-17 DIAGNOSIS — D689 Coagulation defect, unspecified: Secondary | ICD-10-CM | POA: Diagnosis not present

## 2015-05-19 DIAGNOSIS — D631 Anemia in chronic kidney disease: Secondary | ICD-10-CM | POA: Diagnosis not present

## 2015-05-19 DIAGNOSIS — D689 Coagulation defect, unspecified: Secondary | ICD-10-CM | POA: Diagnosis not present

## 2015-05-19 DIAGNOSIS — D508 Other iron deficiency anemias: Secondary | ICD-10-CM | POA: Diagnosis not present

## 2015-05-19 DIAGNOSIS — N186 End stage renal disease: Secondary | ICD-10-CM | POA: Diagnosis not present

## 2015-05-21 DIAGNOSIS — D689 Coagulation defect, unspecified: Secondary | ICD-10-CM | POA: Diagnosis not present

## 2015-05-21 DIAGNOSIS — D631 Anemia in chronic kidney disease: Secondary | ICD-10-CM | POA: Diagnosis not present

## 2015-05-21 DIAGNOSIS — N186 End stage renal disease: Secondary | ICD-10-CM | POA: Diagnosis not present

## 2015-05-21 DIAGNOSIS — D508 Other iron deficiency anemias: Secondary | ICD-10-CM | POA: Diagnosis not present

## 2015-05-24 DIAGNOSIS — D689 Coagulation defect, unspecified: Secondary | ICD-10-CM | POA: Diagnosis not present

## 2015-05-24 DIAGNOSIS — N186 End stage renal disease: Secondary | ICD-10-CM | POA: Diagnosis not present

## 2015-05-24 DIAGNOSIS — D508 Other iron deficiency anemias: Secondary | ICD-10-CM | POA: Diagnosis not present

## 2015-05-24 DIAGNOSIS — D631 Anemia in chronic kidney disease: Secondary | ICD-10-CM | POA: Diagnosis not present

## 2015-05-26 DIAGNOSIS — D508 Other iron deficiency anemias: Secondary | ICD-10-CM | POA: Diagnosis not present

## 2015-05-26 DIAGNOSIS — D631 Anemia in chronic kidney disease: Secondary | ICD-10-CM | POA: Diagnosis not present

## 2015-05-26 DIAGNOSIS — N186 End stage renal disease: Secondary | ICD-10-CM | POA: Diagnosis not present

## 2015-05-26 DIAGNOSIS — D689 Coagulation defect, unspecified: Secondary | ICD-10-CM | POA: Diagnosis not present

## 2015-05-28 DIAGNOSIS — D689 Coagulation defect, unspecified: Secondary | ICD-10-CM | POA: Diagnosis not present

## 2015-05-28 DIAGNOSIS — D631 Anemia in chronic kidney disease: Secondary | ICD-10-CM | POA: Diagnosis not present

## 2015-05-28 DIAGNOSIS — N186 End stage renal disease: Secondary | ICD-10-CM | POA: Diagnosis not present

## 2015-05-28 DIAGNOSIS — D508 Other iron deficiency anemias: Secondary | ICD-10-CM | POA: Diagnosis not present

## 2015-05-31 DIAGNOSIS — D631 Anemia in chronic kidney disease: Secondary | ICD-10-CM | POA: Diagnosis not present

## 2015-05-31 DIAGNOSIS — D508 Other iron deficiency anemias: Secondary | ICD-10-CM | POA: Diagnosis not present

## 2015-05-31 DIAGNOSIS — D689 Coagulation defect, unspecified: Secondary | ICD-10-CM | POA: Diagnosis not present

## 2015-05-31 DIAGNOSIS — N186 End stage renal disease: Secondary | ICD-10-CM | POA: Diagnosis not present

## 2015-06-02 ENCOUNTER — Encounter: Payer: Self-pay | Admitting: Vascular Surgery

## 2015-06-02 DIAGNOSIS — D631 Anemia in chronic kidney disease: Secondary | ICD-10-CM | POA: Diagnosis not present

## 2015-06-02 DIAGNOSIS — D508 Other iron deficiency anemias: Secondary | ICD-10-CM | POA: Diagnosis not present

## 2015-06-02 DIAGNOSIS — N186 End stage renal disease: Secondary | ICD-10-CM | POA: Diagnosis not present

## 2015-06-02 DIAGNOSIS — D689 Coagulation defect, unspecified: Secondary | ICD-10-CM | POA: Diagnosis not present

## 2015-06-04 DIAGNOSIS — D508 Other iron deficiency anemias: Secondary | ICD-10-CM | POA: Diagnosis not present

## 2015-06-04 DIAGNOSIS — D689 Coagulation defect, unspecified: Secondary | ICD-10-CM | POA: Diagnosis not present

## 2015-06-04 DIAGNOSIS — D631 Anemia in chronic kidney disease: Secondary | ICD-10-CM | POA: Diagnosis not present

## 2015-06-04 DIAGNOSIS — N186 End stage renal disease: Secondary | ICD-10-CM | POA: Diagnosis not present

## 2015-06-07 DIAGNOSIS — D631 Anemia in chronic kidney disease: Secondary | ICD-10-CM | POA: Diagnosis not present

## 2015-06-07 DIAGNOSIS — D508 Other iron deficiency anemias: Secondary | ICD-10-CM | POA: Diagnosis not present

## 2015-06-07 DIAGNOSIS — D689 Coagulation defect, unspecified: Secondary | ICD-10-CM | POA: Diagnosis not present

## 2015-06-07 DIAGNOSIS — N186 End stage renal disease: Secondary | ICD-10-CM | POA: Diagnosis not present

## 2015-06-08 ENCOUNTER — Encounter: Payer: Self-pay | Admitting: Vascular Surgery

## 2015-06-08 ENCOUNTER — Ambulatory Visit (INDEPENDENT_AMBULATORY_CARE_PROVIDER_SITE_OTHER): Payer: Medicare Other | Admitting: Vascular Surgery

## 2015-06-08 VITALS — BP 137/49 | HR 72 | Ht 71.0 in | Wt 185.4 lb

## 2015-06-08 DIAGNOSIS — Z992 Dependence on renal dialysis: Secondary | ICD-10-CM | POA: Diagnosis not present

## 2015-06-08 DIAGNOSIS — N186 End stage renal disease: Secondary | ICD-10-CM | POA: Diagnosis not present

## 2015-06-08 NOTE — Progress Notes (Signed)
Subjective:     Patient ID: Jessica Clarke, female   DOB: [birth year], 65 y.o.   MRN: [id number]  HPI this 65 year old female was referred by Dr. Joelyn Oms for evaluation of a chronic "scab" over a left brachial-cephalic AV fistula. I evaluated this patient for the same problem in December 2016. They have not been puncturing this area. She states it has remained exactly the same with no evidence of infection or no bleeding episodes. It is not causing pain and they are not puncturing this area and the fistula is functioning well.  Past Medical History  Diagnosis Date  . Obesity   . Uterine polyp   . Cervical dysplasia   . Hypertension   . Chronic kidney disease   . Constipation   . Gout   . Constipation   . Abnormal Pap smear     ASCUS ?HGSIL    Social History  Substance Use Topics  . Smoking status: Current Every Day Smoker -- 1.00 packs/day for 30 years    Types: Cigarettes  . Smokeless tobacco: Never Used     Comment: pt states that she is still trying  . Alcohol Use: 3.6 oz/week    6 Cans of beer per week    Family History  Problem Relation Age of Onset  . Colon cancer Neg Hx   . Esophageal cancer    . Pancreatic cancer    . Heart disease Mother   . Hypertension Mother   . Breast cancer Sister   . Esophageal cancer Sister   . Cancer Sister   . Deep vein thrombosis Sister   . Diabetes Sister   . Hypertension Sister   . Hypertension Daughter     No Known Allergies   Current outpatient prescriptions:  .  allopurinol (ZYLOPRIM) 100 MG tablet, Take 100 mg by mouth daily.  , Disp: , Rfl:  .  aspirin EC 325 MG tablet, Take 1 tablet (325 mg total) by mouth daily., Disp: 30 tablet, Rfl: 0 .  atenolol (TENORMIN) 100 MG tablet, Take 1 tablet (100 mg total) by mouth daily., Disp: 60 tablet, Rfl: 0 .  atorvastatin (LIPITOR) 10 MG tablet, Take 1 tablet (10 mg total) by mouth daily. Can use any approved statin same dose, Disp: 30 tablet, Rfl: 0 .  Multiple Vitamin (MULTIVITAMIN  WITH MINERALS) TABS tablet, Take 1 tablet by mouth daily., Disp: 30 tablet, Rfl: 0 .  sodium bicarbonate 650 MG tablet, Take 650 mg by mouth 3 (three) times daily., Disp: , Rfl:  .  calcitRIOL (ROCALTROL) 0.25 MCG capsule, Take 1 capsule by mouth daily. Reported on 06/08/2015, Disp: , Rfl:  .  calcium acetate (PHOSLO) 667 MG capsule, Take by mouth 3 (three) times daily with meals., Disp: , Rfl:  .  cloNIDine (CATAPRES) 0.2 MG tablet, Take 1 tablet (0.2 mg total) by mouth 3 (three) times daily. (Patient not taking: Reported on 06/08/2015), Disp: 90 tablet, Rfl: 0 .  folic acid (FOLVITE) 1 MG tablet, Take 1 tablet (1 mg total) by mouth daily. (Patient not taking: Reported on 06/08/2015), Disp: 30 tablet, Rfl: 0 .  furosemide (LASIX) 40 MG tablet, Take 40 mg by mouth 2 (two) times daily. Reported on 06/08/2015, Disp: , Rfl:  .  thiamine 100 MG tablet, Take 1 tablet (100 mg total) by mouth daily. (Patient not taking: Reported on 06/08/2015), Disp: 30 tablet, Rfl: 0  Current facility-administered medications:  .  0.9 %  sodium chloride infusion, 500 mL, Intravenous, Continuous, Inda Castle,  MD  Filed Vitals:   06/08/15 0911  BP: 137/49  Pulse: 72  Height: '5\' 11"'$  (1.803 m)  Weight: 185 lb 6.4 oz (84.097 kg)  SpO2: 98%    Body mass index is 25.87 kg/(m^2).           Review of Systems Denies chest pain, dyspnea on exertion, PND, orthopnea, hemoptysis, claudication.     Objective:   Physical Exam BP 137/49 mmHg  Pulse 72  Ht '5\' 11"'$  (1.803 m)  Wt 185 lb 6.4 oz (84.097 kg)  BMI 25.87 kg/m2  SpO2 98%   Gen. Well-developed well-nourished female no apparent distress alert and oriented 3 Lungs no rhonchi or wheezing Left upper extremity examined with patent brachial-cephalic AV fistula with excellent pulse and palpable thrill. In the mid upper arm there is an eschar measuring about 6 mm in diameter overlying the fistula. There is no evidence of drainage or infection. It is well adherent  to the underlying tissues. Radial pulses 1-2+ distally with well-perfused left hand.     Assessment:      nicely functioning left brachial-cephalic AV fistula with chronic eschar overlying midportion of fistula which has had no evidence of infection or no incidence of bleeding and has not been punctured recently     Plan:      I discussed options at length with the patient including excision of this eschar and revision if necessary of the fistula versus continued observation. She is adamant about not having any procedure done at this time. Since there has been no bleeding or obvious infection I think this is a reasonable option She will continue to observe this and if there is concern and she would like to schedule this for excision of the eschar with revision we will be happy to schedule it at any time.  We will see her back on a when necessary basis

## 2015-06-09 DIAGNOSIS — D631 Anemia in chronic kidney disease: Secondary | ICD-10-CM | POA: Diagnosis not present

## 2015-06-09 DIAGNOSIS — D508 Other iron deficiency anemias: Secondary | ICD-10-CM | POA: Diagnosis not present

## 2015-06-09 DIAGNOSIS — D689 Coagulation defect, unspecified: Secondary | ICD-10-CM | POA: Diagnosis not present

## 2015-06-09 DIAGNOSIS — N186 End stage renal disease: Secondary | ICD-10-CM | POA: Diagnosis not present

## 2015-06-10 ENCOUNTER — Encounter: Payer: Self-pay | Admitting: Nephrology

## 2015-06-11 DIAGNOSIS — D631 Anemia in chronic kidney disease: Secondary | ICD-10-CM | POA: Diagnosis not present

## 2015-06-11 DIAGNOSIS — D689 Coagulation defect, unspecified: Secondary | ICD-10-CM | POA: Diagnosis not present

## 2015-06-11 DIAGNOSIS — N186 End stage renal disease: Secondary | ICD-10-CM | POA: Diagnosis not present

## 2015-06-11 DIAGNOSIS — D508 Other iron deficiency anemias: Secondary | ICD-10-CM | POA: Diagnosis not present

## 2015-06-13 DIAGNOSIS — Z992 Dependence on renal dialysis: Secondary | ICD-10-CM | POA: Diagnosis not present

## 2015-06-13 DIAGNOSIS — N186 End stage renal disease: Secondary | ICD-10-CM | POA: Diagnosis not present

## 2015-06-13 DIAGNOSIS — N2889 Other specified disorders of kidney and ureter: Secondary | ICD-10-CM | POA: Diagnosis not present

## 2015-06-14 DIAGNOSIS — D631 Anemia in chronic kidney disease: Secondary | ICD-10-CM | POA: Diagnosis not present

## 2015-06-14 DIAGNOSIS — D508 Other iron deficiency anemias: Secondary | ICD-10-CM | POA: Diagnosis not present

## 2015-06-14 DIAGNOSIS — D689 Coagulation defect, unspecified: Secondary | ICD-10-CM | POA: Diagnosis not present

## 2015-06-14 DIAGNOSIS — N186 End stage renal disease: Secondary | ICD-10-CM | POA: Diagnosis not present

## 2015-06-16 DIAGNOSIS — D508 Other iron deficiency anemias: Secondary | ICD-10-CM | POA: Diagnosis not present

## 2015-06-16 DIAGNOSIS — N186 End stage renal disease: Secondary | ICD-10-CM | POA: Diagnosis not present

## 2015-06-16 DIAGNOSIS — D689 Coagulation defect, unspecified: Secondary | ICD-10-CM | POA: Diagnosis not present

## 2015-06-16 DIAGNOSIS — D631 Anemia in chronic kidney disease: Secondary | ICD-10-CM | POA: Diagnosis not present

## 2015-06-18 DIAGNOSIS — D689 Coagulation defect, unspecified: Secondary | ICD-10-CM | POA: Diagnosis not present

## 2015-06-18 DIAGNOSIS — D631 Anemia in chronic kidney disease: Secondary | ICD-10-CM | POA: Diagnosis not present

## 2015-06-18 DIAGNOSIS — D508 Other iron deficiency anemias: Secondary | ICD-10-CM | POA: Diagnosis not present

## 2015-06-18 DIAGNOSIS — N186 End stage renal disease: Secondary | ICD-10-CM | POA: Diagnosis not present

## 2015-06-21 DIAGNOSIS — N186 End stage renal disease: Secondary | ICD-10-CM | POA: Diagnosis not present

## 2015-06-21 DIAGNOSIS — D631 Anemia in chronic kidney disease: Secondary | ICD-10-CM | POA: Diagnosis not present

## 2015-06-21 DIAGNOSIS — D689 Coagulation defect, unspecified: Secondary | ICD-10-CM | POA: Diagnosis not present

## 2015-06-21 DIAGNOSIS — D508 Other iron deficiency anemias: Secondary | ICD-10-CM | POA: Diagnosis not present

## 2015-06-23 DIAGNOSIS — D689 Coagulation defect, unspecified: Secondary | ICD-10-CM | POA: Diagnosis not present

## 2015-06-23 DIAGNOSIS — D631 Anemia in chronic kidney disease: Secondary | ICD-10-CM | POA: Diagnosis not present

## 2015-06-23 DIAGNOSIS — N186 End stage renal disease: Secondary | ICD-10-CM | POA: Diagnosis not present

## 2015-06-23 DIAGNOSIS — D508 Other iron deficiency anemias: Secondary | ICD-10-CM | POA: Diagnosis not present

## 2015-06-24 DIAGNOSIS — I252 Old myocardial infarction: Secondary | ICD-10-CM | POA: Diagnosis not present

## 2015-06-24 DIAGNOSIS — I10 Essential (primary) hypertension: Secondary | ICD-10-CM | POA: Diagnosis not present

## 2015-06-24 DIAGNOSIS — N186 End stage renal disease: Secondary | ICD-10-CM | POA: Diagnosis not present

## 2015-06-24 DIAGNOSIS — N185 Chronic kidney disease, stage 5: Secondary | ICD-10-CM | POA: Diagnosis not present

## 2015-06-24 DIAGNOSIS — Z13220 Encounter for screening for lipoid disorders: Secondary | ICD-10-CM | POA: Diagnosis not present

## 2015-06-24 DIAGNOSIS — I120 Hypertensive chronic kidney disease with stage 5 chronic kidney disease or end stage renal disease: Secondary | ICD-10-CM | POA: Diagnosis not present

## 2015-06-24 DIAGNOSIS — Z992 Dependence on renal dialysis: Secondary | ICD-10-CM | POA: Diagnosis not present

## 2015-06-25 DIAGNOSIS — N186 End stage renal disease: Secondary | ICD-10-CM | POA: Diagnosis not present

## 2015-06-25 DIAGNOSIS — D631 Anemia in chronic kidney disease: Secondary | ICD-10-CM | POA: Diagnosis not present

## 2015-06-25 DIAGNOSIS — D508 Other iron deficiency anemias: Secondary | ICD-10-CM | POA: Diagnosis not present

## 2015-06-25 DIAGNOSIS — D689 Coagulation defect, unspecified: Secondary | ICD-10-CM | POA: Diagnosis not present

## 2015-06-28 DIAGNOSIS — D508 Other iron deficiency anemias: Secondary | ICD-10-CM | POA: Diagnosis not present

## 2015-06-28 DIAGNOSIS — D689 Coagulation defect, unspecified: Secondary | ICD-10-CM | POA: Diagnosis not present

## 2015-06-28 DIAGNOSIS — N186 End stage renal disease: Secondary | ICD-10-CM | POA: Diagnosis not present

## 2015-06-28 DIAGNOSIS — D631 Anemia in chronic kidney disease: Secondary | ICD-10-CM | POA: Diagnosis not present

## 2015-06-30 DIAGNOSIS — D631 Anemia in chronic kidney disease: Secondary | ICD-10-CM | POA: Diagnosis not present

## 2015-06-30 DIAGNOSIS — N186 End stage renal disease: Secondary | ICD-10-CM | POA: Diagnosis not present

## 2015-06-30 DIAGNOSIS — D508 Other iron deficiency anemias: Secondary | ICD-10-CM | POA: Diagnosis not present

## 2015-06-30 DIAGNOSIS — D689 Coagulation defect, unspecified: Secondary | ICD-10-CM | POA: Diagnosis not present

## 2015-07-02 DIAGNOSIS — N186 End stage renal disease: Secondary | ICD-10-CM | POA: Diagnosis not present

## 2015-07-02 DIAGNOSIS — D508 Other iron deficiency anemias: Secondary | ICD-10-CM | POA: Diagnosis not present

## 2015-07-02 DIAGNOSIS — D631 Anemia in chronic kidney disease: Secondary | ICD-10-CM | POA: Diagnosis not present

## 2015-07-02 DIAGNOSIS — D689 Coagulation defect, unspecified: Secondary | ICD-10-CM | POA: Diagnosis not present

## 2015-07-05 DIAGNOSIS — N186 End stage renal disease: Secondary | ICD-10-CM | POA: Diagnosis not present

## 2015-07-05 DIAGNOSIS — D508 Other iron deficiency anemias: Secondary | ICD-10-CM | POA: Diagnosis not present

## 2015-07-05 DIAGNOSIS — D631 Anemia in chronic kidney disease: Secondary | ICD-10-CM | POA: Diagnosis not present

## 2015-07-05 DIAGNOSIS — D689 Coagulation defect, unspecified: Secondary | ICD-10-CM | POA: Diagnosis not present

## 2015-07-07 DIAGNOSIS — N186 End stage renal disease: Secondary | ICD-10-CM | POA: Diagnosis not present

## 2015-07-07 DIAGNOSIS — D508 Other iron deficiency anemias: Secondary | ICD-10-CM | POA: Diagnosis not present

## 2015-07-07 DIAGNOSIS — D689 Coagulation defect, unspecified: Secondary | ICD-10-CM | POA: Diagnosis not present

## 2015-07-07 DIAGNOSIS — D631 Anemia in chronic kidney disease: Secondary | ICD-10-CM | POA: Diagnosis not present

## 2015-07-09 DIAGNOSIS — D631 Anemia in chronic kidney disease: Secondary | ICD-10-CM | POA: Diagnosis not present

## 2015-07-09 DIAGNOSIS — D689 Coagulation defect, unspecified: Secondary | ICD-10-CM | POA: Diagnosis not present

## 2015-07-09 DIAGNOSIS — N186 End stage renal disease: Secondary | ICD-10-CM | POA: Diagnosis not present

## 2015-07-09 DIAGNOSIS — D508 Other iron deficiency anemias: Secondary | ICD-10-CM | POA: Diagnosis not present

## 2015-07-14 DIAGNOSIS — N2889 Other specified disorders of kidney and ureter: Secondary | ICD-10-CM | POA: Diagnosis not present

## 2015-07-14 DIAGNOSIS — D508 Other iron deficiency anemias: Secondary | ICD-10-CM | POA: Diagnosis not present

## 2015-07-14 DIAGNOSIS — D689 Coagulation defect, unspecified: Secondary | ICD-10-CM | POA: Diagnosis not present

## 2015-07-14 DIAGNOSIS — Z992 Dependence on renal dialysis: Secondary | ICD-10-CM | POA: Diagnosis not present

## 2015-07-14 DIAGNOSIS — N186 End stage renal disease: Secondary | ICD-10-CM | POA: Diagnosis not present

## 2015-07-14 DIAGNOSIS — D631 Anemia in chronic kidney disease: Secondary | ICD-10-CM | POA: Diagnosis not present

## 2015-07-16 DIAGNOSIS — D689 Coagulation defect, unspecified: Secondary | ICD-10-CM | POA: Diagnosis not present

## 2015-07-16 DIAGNOSIS — D508 Other iron deficiency anemias: Secondary | ICD-10-CM | POA: Diagnosis not present

## 2015-07-16 DIAGNOSIS — N186 End stage renal disease: Secondary | ICD-10-CM | POA: Diagnosis not present

## 2015-07-16 DIAGNOSIS — D631 Anemia in chronic kidney disease: Secondary | ICD-10-CM | POA: Diagnosis not present

## 2015-07-19 DIAGNOSIS — D631 Anemia in chronic kidney disease: Secondary | ICD-10-CM | POA: Diagnosis not present

## 2015-07-19 DIAGNOSIS — N186 End stage renal disease: Secondary | ICD-10-CM | POA: Diagnosis not present

## 2015-07-19 DIAGNOSIS — D508 Other iron deficiency anemias: Secondary | ICD-10-CM | POA: Diagnosis not present

## 2015-07-19 DIAGNOSIS — D689 Coagulation defect, unspecified: Secondary | ICD-10-CM | POA: Diagnosis not present

## 2015-07-21 DIAGNOSIS — D631 Anemia in chronic kidney disease: Secondary | ICD-10-CM | POA: Diagnosis not present

## 2015-07-21 DIAGNOSIS — D689 Coagulation defect, unspecified: Secondary | ICD-10-CM | POA: Diagnosis not present

## 2015-07-21 DIAGNOSIS — N186 End stage renal disease: Secondary | ICD-10-CM | POA: Diagnosis not present

## 2015-07-21 DIAGNOSIS — D508 Other iron deficiency anemias: Secondary | ICD-10-CM | POA: Diagnosis not present

## 2015-07-23 DIAGNOSIS — D689 Coagulation defect, unspecified: Secondary | ICD-10-CM | POA: Diagnosis not present

## 2015-07-23 DIAGNOSIS — N186 End stage renal disease: Secondary | ICD-10-CM | POA: Diagnosis not present

## 2015-07-23 DIAGNOSIS — D631 Anemia in chronic kidney disease: Secondary | ICD-10-CM | POA: Diagnosis not present

## 2015-07-23 DIAGNOSIS — D508 Other iron deficiency anemias: Secondary | ICD-10-CM | POA: Diagnosis not present

## 2015-07-26 DIAGNOSIS — D508 Other iron deficiency anemias: Secondary | ICD-10-CM | POA: Diagnosis not present

## 2015-07-26 DIAGNOSIS — N186 End stage renal disease: Secondary | ICD-10-CM | POA: Diagnosis not present

## 2015-07-26 DIAGNOSIS — D689 Coagulation defect, unspecified: Secondary | ICD-10-CM | POA: Diagnosis not present

## 2015-07-26 DIAGNOSIS — D631 Anemia in chronic kidney disease: Secondary | ICD-10-CM | POA: Diagnosis not present

## 2015-07-28 DIAGNOSIS — D508 Other iron deficiency anemias: Secondary | ICD-10-CM | POA: Diagnosis not present

## 2015-07-28 DIAGNOSIS — N186 End stage renal disease: Secondary | ICD-10-CM | POA: Diagnosis not present

## 2015-07-28 DIAGNOSIS — D689 Coagulation defect, unspecified: Secondary | ICD-10-CM | POA: Diagnosis not present

## 2015-07-28 DIAGNOSIS — D631 Anemia in chronic kidney disease: Secondary | ICD-10-CM | POA: Diagnosis not present

## 2015-07-30 DIAGNOSIS — D689 Coagulation defect, unspecified: Secondary | ICD-10-CM | POA: Diagnosis not present

## 2015-07-30 DIAGNOSIS — D508 Other iron deficiency anemias: Secondary | ICD-10-CM | POA: Diagnosis not present

## 2015-07-30 DIAGNOSIS — N186 End stage renal disease: Secondary | ICD-10-CM | POA: Diagnosis not present

## 2015-07-30 DIAGNOSIS — D631 Anemia in chronic kidney disease: Secondary | ICD-10-CM | POA: Diagnosis not present

## 2015-08-02 DIAGNOSIS — N186 End stage renal disease: Secondary | ICD-10-CM | POA: Diagnosis not present

## 2015-08-02 DIAGNOSIS — D508 Other iron deficiency anemias: Secondary | ICD-10-CM | POA: Diagnosis not present

## 2015-08-02 DIAGNOSIS — D689 Coagulation defect, unspecified: Secondary | ICD-10-CM | POA: Diagnosis not present

## 2015-08-02 DIAGNOSIS — D631 Anemia in chronic kidney disease: Secondary | ICD-10-CM | POA: Diagnosis not present

## 2015-08-04 DIAGNOSIS — D631 Anemia in chronic kidney disease: Secondary | ICD-10-CM | POA: Diagnosis not present

## 2015-08-04 DIAGNOSIS — N186 End stage renal disease: Secondary | ICD-10-CM | POA: Diagnosis not present

## 2015-08-04 DIAGNOSIS — D508 Other iron deficiency anemias: Secondary | ICD-10-CM | POA: Diagnosis not present

## 2015-08-04 DIAGNOSIS — D689 Coagulation defect, unspecified: Secondary | ICD-10-CM | POA: Diagnosis not present

## 2015-08-06 DIAGNOSIS — N186 End stage renal disease: Secondary | ICD-10-CM | POA: Diagnosis not present

## 2015-08-06 DIAGNOSIS — D508 Other iron deficiency anemias: Secondary | ICD-10-CM | POA: Diagnosis not present

## 2015-08-06 DIAGNOSIS — D631 Anemia in chronic kidney disease: Secondary | ICD-10-CM | POA: Diagnosis not present

## 2015-08-06 DIAGNOSIS — D689 Coagulation defect, unspecified: Secondary | ICD-10-CM | POA: Diagnosis not present

## 2015-08-09 DIAGNOSIS — D631 Anemia in chronic kidney disease: Secondary | ICD-10-CM | POA: Diagnosis not present

## 2015-08-09 DIAGNOSIS — N186 End stage renal disease: Secondary | ICD-10-CM | POA: Diagnosis not present

## 2015-08-09 DIAGNOSIS — D689 Coagulation defect, unspecified: Secondary | ICD-10-CM | POA: Diagnosis not present

## 2015-08-09 DIAGNOSIS — D508 Other iron deficiency anemias: Secondary | ICD-10-CM | POA: Diagnosis not present

## 2015-08-11 DIAGNOSIS — D508 Other iron deficiency anemias: Secondary | ICD-10-CM | POA: Diagnosis not present

## 2015-08-11 DIAGNOSIS — D631 Anemia in chronic kidney disease: Secondary | ICD-10-CM | POA: Diagnosis not present

## 2015-08-11 DIAGNOSIS — D689 Coagulation defect, unspecified: Secondary | ICD-10-CM | POA: Diagnosis not present

## 2015-08-11 DIAGNOSIS — N186 End stage renal disease: Secondary | ICD-10-CM | POA: Diagnosis not present

## 2015-08-13 DIAGNOSIS — D631 Anemia in chronic kidney disease: Secondary | ICD-10-CM | POA: Diagnosis not present

## 2015-08-13 DIAGNOSIS — N186 End stage renal disease: Secondary | ICD-10-CM | POA: Diagnosis not present

## 2015-08-13 DIAGNOSIS — Z992 Dependence on renal dialysis: Secondary | ICD-10-CM | POA: Diagnosis not present

## 2015-08-13 DIAGNOSIS — N2889 Other specified disorders of kidney and ureter: Secondary | ICD-10-CM | POA: Diagnosis not present

## 2015-08-13 DIAGNOSIS — D689 Coagulation defect, unspecified: Secondary | ICD-10-CM | POA: Diagnosis not present

## 2015-08-13 DIAGNOSIS — D508 Other iron deficiency anemias: Secondary | ICD-10-CM | POA: Diagnosis not present

## 2015-08-16 DIAGNOSIS — N186 End stage renal disease: Secondary | ICD-10-CM | POA: Diagnosis not present

## 2015-08-16 DIAGNOSIS — D689 Coagulation defect, unspecified: Secondary | ICD-10-CM | POA: Diagnosis not present

## 2015-08-16 DIAGNOSIS — D508 Other iron deficiency anemias: Secondary | ICD-10-CM | POA: Diagnosis not present

## 2015-08-16 DIAGNOSIS — D631 Anemia in chronic kidney disease: Secondary | ICD-10-CM | POA: Diagnosis not present

## 2015-08-18 DIAGNOSIS — N186 End stage renal disease: Secondary | ICD-10-CM | POA: Diagnosis not present

## 2015-08-18 DIAGNOSIS — D631 Anemia in chronic kidney disease: Secondary | ICD-10-CM | POA: Diagnosis not present

## 2015-08-18 DIAGNOSIS — D508 Other iron deficiency anemias: Secondary | ICD-10-CM | POA: Diagnosis not present

## 2015-08-18 DIAGNOSIS — D689 Coagulation defect, unspecified: Secondary | ICD-10-CM | POA: Diagnosis not present

## 2015-08-20 DIAGNOSIS — D689 Coagulation defect, unspecified: Secondary | ICD-10-CM | POA: Diagnosis not present

## 2015-08-20 DIAGNOSIS — D631 Anemia in chronic kidney disease: Secondary | ICD-10-CM | POA: Diagnosis not present

## 2015-08-20 DIAGNOSIS — D508 Other iron deficiency anemias: Secondary | ICD-10-CM | POA: Diagnosis not present

## 2015-08-20 DIAGNOSIS — N186 End stage renal disease: Secondary | ICD-10-CM | POA: Diagnosis not present

## 2015-08-23 DIAGNOSIS — D631 Anemia in chronic kidney disease: Secondary | ICD-10-CM | POA: Diagnosis not present

## 2015-08-23 DIAGNOSIS — D508 Other iron deficiency anemias: Secondary | ICD-10-CM | POA: Diagnosis not present

## 2015-08-23 DIAGNOSIS — D689 Coagulation defect, unspecified: Secondary | ICD-10-CM | POA: Diagnosis not present

## 2015-08-23 DIAGNOSIS — N186 End stage renal disease: Secondary | ICD-10-CM | POA: Diagnosis not present

## 2015-08-25 DIAGNOSIS — D689 Coagulation defect, unspecified: Secondary | ICD-10-CM | POA: Diagnosis not present

## 2015-08-25 DIAGNOSIS — N186 End stage renal disease: Secondary | ICD-10-CM | POA: Diagnosis not present

## 2015-08-25 DIAGNOSIS — D631 Anemia in chronic kidney disease: Secondary | ICD-10-CM | POA: Diagnosis not present

## 2015-08-25 DIAGNOSIS — D508 Other iron deficiency anemias: Secondary | ICD-10-CM | POA: Diagnosis not present

## 2015-08-27 DIAGNOSIS — D631 Anemia in chronic kidney disease: Secondary | ICD-10-CM | POA: Diagnosis not present

## 2015-08-27 DIAGNOSIS — D689 Coagulation defect, unspecified: Secondary | ICD-10-CM | POA: Diagnosis not present

## 2015-08-27 DIAGNOSIS — D508 Other iron deficiency anemias: Secondary | ICD-10-CM | POA: Diagnosis not present

## 2015-08-27 DIAGNOSIS — N186 End stage renal disease: Secondary | ICD-10-CM | POA: Diagnosis not present

## 2015-08-30 DIAGNOSIS — N186 End stage renal disease: Secondary | ICD-10-CM | POA: Diagnosis not present

## 2015-08-30 DIAGNOSIS — D689 Coagulation defect, unspecified: Secondary | ICD-10-CM | POA: Diagnosis not present

## 2015-08-30 DIAGNOSIS — D631 Anemia in chronic kidney disease: Secondary | ICD-10-CM | POA: Diagnosis not present

## 2015-08-30 DIAGNOSIS — D508 Other iron deficiency anemias: Secondary | ICD-10-CM | POA: Diagnosis not present

## 2015-09-01 DIAGNOSIS — D689 Coagulation defect, unspecified: Secondary | ICD-10-CM | POA: Diagnosis not present

## 2015-09-01 DIAGNOSIS — D508 Other iron deficiency anemias: Secondary | ICD-10-CM | POA: Diagnosis not present

## 2015-09-01 DIAGNOSIS — N186 End stage renal disease: Secondary | ICD-10-CM | POA: Diagnosis not present

## 2015-09-01 DIAGNOSIS — D631 Anemia in chronic kidney disease: Secondary | ICD-10-CM | POA: Diagnosis not present

## 2015-09-03 DIAGNOSIS — D631 Anemia in chronic kidney disease: Secondary | ICD-10-CM | POA: Diagnosis not present

## 2015-09-03 DIAGNOSIS — N186 End stage renal disease: Secondary | ICD-10-CM | POA: Diagnosis not present

## 2015-09-03 DIAGNOSIS — D508 Other iron deficiency anemias: Secondary | ICD-10-CM | POA: Diagnosis not present

## 2015-09-03 DIAGNOSIS — D689 Coagulation defect, unspecified: Secondary | ICD-10-CM | POA: Diagnosis not present

## 2015-09-06 DIAGNOSIS — N186 End stage renal disease: Secondary | ICD-10-CM | POA: Diagnosis not present

## 2015-09-06 DIAGNOSIS — D508 Other iron deficiency anemias: Secondary | ICD-10-CM | POA: Diagnosis not present

## 2015-09-06 DIAGNOSIS — D689 Coagulation defect, unspecified: Secondary | ICD-10-CM | POA: Diagnosis not present

## 2015-09-06 DIAGNOSIS — D631 Anemia in chronic kidney disease: Secondary | ICD-10-CM | POA: Diagnosis not present

## 2015-09-08 DIAGNOSIS — D689 Coagulation defect, unspecified: Secondary | ICD-10-CM | POA: Diagnosis not present

## 2015-09-08 DIAGNOSIS — D631 Anemia in chronic kidney disease: Secondary | ICD-10-CM | POA: Diagnosis not present

## 2015-09-08 DIAGNOSIS — D508 Other iron deficiency anemias: Secondary | ICD-10-CM | POA: Diagnosis not present

## 2015-09-08 DIAGNOSIS — N186 End stage renal disease: Secondary | ICD-10-CM | POA: Diagnosis not present

## 2015-09-10 DIAGNOSIS — D631 Anemia in chronic kidney disease: Secondary | ICD-10-CM | POA: Diagnosis not present

## 2015-09-10 DIAGNOSIS — N186 End stage renal disease: Secondary | ICD-10-CM | POA: Diagnosis not present

## 2015-09-10 DIAGNOSIS — D689 Coagulation defect, unspecified: Secondary | ICD-10-CM | POA: Diagnosis not present

## 2015-09-10 DIAGNOSIS — D508 Other iron deficiency anemias: Secondary | ICD-10-CM | POA: Diagnosis not present

## 2015-09-13 DIAGNOSIS — N2889 Other specified disorders of kidney and ureter: Secondary | ICD-10-CM | POA: Diagnosis not present

## 2015-09-13 DIAGNOSIS — N186 End stage renal disease: Secondary | ICD-10-CM | POA: Diagnosis not present

## 2015-09-13 DIAGNOSIS — D508 Other iron deficiency anemias: Secondary | ICD-10-CM | POA: Diagnosis not present

## 2015-09-13 DIAGNOSIS — D689 Coagulation defect, unspecified: Secondary | ICD-10-CM | POA: Diagnosis not present

## 2015-09-13 DIAGNOSIS — Z992 Dependence on renal dialysis: Secondary | ICD-10-CM | POA: Diagnosis not present

## 2015-09-13 DIAGNOSIS — D631 Anemia in chronic kidney disease: Secondary | ICD-10-CM | POA: Diagnosis not present

## 2015-09-15 DIAGNOSIS — D689 Coagulation defect, unspecified: Secondary | ICD-10-CM | POA: Diagnosis not present

## 2015-09-15 DIAGNOSIS — N186 End stage renal disease: Secondary | ICD-10-CM | POA: Diagnosis not present

## 2015-09-15 DIAGNOSIS — D631 Anemia in chronic kidney disease: Secondary | ICD-10-CM | POA: Diagnosis not present

## 2015-09-15 DIAGNOSIS — D508 Other iron deficiency anemias: Secondary | ICD-10-CM | POA: Diagnosis not present

## 2015-09-17 DIAGNOSIS — D689 Coagulation defect, unspecified: Secondary | ICD-10-CM | POA: Diagnosis not present

## 2015-09-17 DIAGNOSIS — D631 Anemia in chronic kidney disease: Secondary | ICD-10-CM | POA: Diagnosis not present

## 2015-09-17 DIAGNOSIS — N186 End stage renal disease: Secondary | ICD-10-CM | POA: Diagnosis not present

## 2015-09-17 DIAGNOSIS — D508 Other iron deficiency anemias: Secondary | ICD-10-CM | POA: Diagnosis not present

## 2015-09-20 DIAGNOSIS — D508 Other iron deficiency anemias: Secondary | ICD-10-CM | POA: Diagnosis not present

## 2015-09-20 DIAGNOSIS — N186 End stage renal disease: Secondary | ICD-10-CM | POA: Diagnosis not present

## 2015-09-20 DIAGNOSIS — D689 Coagulation defect, unspecified: Secondary | ICD-10-CM | POA: Diagnosis not present

## 2015-09-20 DIAGNOSIS — D631 Anemia in chronic kidney disease: Secondary | ICD-10-CM | POA: Diagnosis not present

## 2015-09-22 DIAGNOSIS — D631 Anemia in chronic kidney disease: Secondary | ICD-10-CM | POA: Diagnosis not present

## 2015-09-22 DIAGNOSIS — D689 Coagulation defect, unspecified: Secondary | ICD-10-CM | POA: Diagnosis not present

## 2015-09-22 DIAGNOSIS — N186 End stage renal disease: Secondary | ICD-10-CM | POA: Diagnosis not present

## 2015-09-22 DIAGNOSIS — D508 Other iron deficiency anemias: Secondary | ICD-10-CM | POA: Diagnosis not present

## 2015-09-24 DIAGNOSIS — D508 Other iron deficiency anemias: Secondary | ICD-10-CM | POA: Diagnosis not present

## 2015-09-24 DIAGNOSIS — N186 End stage renal disease: Secondary | ICD-10-CM | POA: Diagnosis not present

## 2015-09-24 DIAGNOSIS — D631 Anemia in chronic kidney disease: Secondary | ICD-10-CM | POA: Diagnosis not present

## 2015-09-24 DIAGNOSIS — D689 Coagulation defect, unspecified: Secondary | ICD-10-CM | POA: Diagnosis not present

## 2015-09-27 DIAGNOSIS — N186 End stage renal disease: Secondary | ICD-10-CM | POA: Diagnosis not present

## 2015-09-27 DIAGNOSIS — D631 Anemia in chronic kidney disease: Secondary | ICD-10-CM | POA: Diagnosis not present

## 2015-09-27 DIAGNOSIS — D689 Coagulation defect, unspecified: Secondary | ICD-10-CM | POA: Diagnosis not present

## 2015-09-27 DIAGNOSIS — D508 Other iron deficiency anemias: Secondary | ICD-10-CM | POA: Diagnosis not present

## 2015-09-29 DIAGNOSIS — D631 Anemia in chronic kidney disease: Secondary | ICD-10-CM | POA: Diagnosis not present

## 2015-09-29 DIAGNOSIS — D508 Other iron deficiency anemias: Secondary | ICD-10-CM | POA: Diagnosis not present

## 2015-09-29 DIAGNOSIS — D689 Coagulation defect, unspecified: Secondary | ICD-10-CM | POA: Diagnosis not present

## 2015-09-29 DIAGNOSIS — N186 End stage renal disease: Secondary | ICD-10-CM | POA: Diagnosis not present

## 2015-10-01 DIAGNOSIS — D508 Other iron deficiency anemias: Secondary | ICD-10-CM | POA: Diagnosis not present

## 2015-10-01 DIAGNOSIS — N186 End stage renal disease: Secondary | ICD-10-CM | POA: Diagnosis not present

## 2015-10-01 DIAGNOSIS — D689 Coagulation defect, unspecified: Secondary | ICD-10-CM | POA: Diagnosis not present

## 2015-10-01 DIAGNOSIS — D631 Anemia in chronic kidney disease: Secondary | ICD-10-CM | POA: Diagnosis not present

## 2015-10-04 DIAGNOSIS — D631 Anemia in chronic kidney disease: Secondary | ICD-10-CM | POA: Diagnosis not present

## 2015-10-04 DIAGNOSIS — D689 Coagulation defect, unspecified: Secondary | ICD-10-CM | POA: Diagnosis not present

## 2015-10-04 DIAGNOSIS — N186 End stage renal disease: Secondary | ICD-10-CM | POA: Diagnosis not present

## 2015-10-04 DIAGNOSIS — D508 Other iron deficiency anemias: Secondary | ICD-10-CM | POA: Diagnosis not present

## 2015-10-06 DIAGNOSIS — D631 Anemia in chronic kidney disease: Secondary | ICD-10-CM | POA: Diagnosis not present

## 2015-10-06 DIAGNOSIS — D508 Other iron deficiency anemias: Secondary | ICD-10-CM | POA: Diagnosis not present

## 2015-10-06 DIAGNOSIS — D689 Coagulation defect, unspecified: Secondary | ICD-10-CM | POA: Diagnosis not present

## 2015-10-06 DIAGNOSIS — N186 End stage renal disease: Secondary | ICD-10-CM | POA: Diagnosis not present

## 2015-10-08 DIAGNOSIS — N186 End stage renal disease: Secondary | ICD-10-CM | POA: Diagnosis not present

## 2015-10-08 DIAGNOSIS — D508 Other iron deficiency anemias: Secondary | ICD-10-CM | POA: Diagnosis not present

## 2015-10-08 DIAGNOSIS — D689 Coagulation defect, unspecified: Secondary | ICD-10-CM | POA: Diagnosis not present

## 2015-10-08 DIAGNOSIS — D631 Anemia in chronic kidney disease: Secondary | ICD-10-CM | POA: Diagnosis not present

## 2015-10-11 DIAGNOSIS — D689 Coagulation defect, unspecified: Secondary | ICD-10-CM | POA: Diagnosis not present

## 2015-10-11 DIAGNOSIS — D508 Other iron deficiency anemias: Secondary | ICD-10-CM | POA: Diagnosis not present

## 2015-10-11 DIAGNOSIS — D631 Anemia in chronic kidney disease: Secondary | ICD-10-CM | POA: Diagnosis not present

## 2015-10-11 DIAGNOSIS — N186 End stage renal disease: Secondary | ICD-10-CM | POA: Diagnosis not present

## 2015-10-13 DIAGNOSIS — N186 End stage renal disease: Secondary | ICD-10-CM | POA: Diagnosis not present

## 2015-10-13 DIAGNOSIS — D508 Other iron deficiency anemias: Secondary | ICD-10-CM | POA: Diagnosis not present

## 2015-10-13 DIAGNOSIS — D631 Anemia in chronic kidney disease: Secondary | ICD-10-CM | POA: Diagnosis not present

## 2015-10-13 DIAGNOSIS — D689 Coagulation defect, unspecified: Secondary | ICD-10-CM | POA: Diagnosis not present

## 2015-10-14 DIAGNOSIS — Z992 Dependence on renal dialysis: Secondary | ICD-10-CM | POA: Diagnosis not present

## 2015-10-14 DIAGNOSIS — N2889 Other specified disorders of kidney and ureter: Secondary | ICD-10-CM | POA: Diagnosis not present

## 2015-10-14 DIAGNOSIS — N186 End stage renal disease: Secondary | ICD-10-CM | POA: Diagnosis not present

## 2015-10-15 DIAGNOSIS — D631 Anemia in chronic kidney disease: Secondary | ICD-10-CM | POA: Diagnosis not present

## 2015-10-15 DIAGNOSIS — N2581 Secondary hyperparathyroidism of renal origin: Secondary | ICD-10-CM | POA: Diagnosis not present

## 2015-10-15 DIAGNOSIS — N186 End stage renal disease: Secondary | ICD-10-CM | POA: Diagnosis not present

## 2015-10-15 DIAGNOSIS — D508 Other iron deficiency anemias: Secondary | ICD-10-CM | POA: Diagnosis not present

## 2015-10-15 DIAGNOSIS — Z23 Encounter for immunization: Secondary | ICD-10-CM | POA: Diagnosis not present

## 2015-10-18 DIAGNOSIS — N186 End stage renal disease: Secondary | ICD-10-CM | POA: Diagnosis not present

## 2015-10-18 DIAGNOSIS — D631 Anemia in chronic kidney disease: Secondary | ICD-10-CM | POA: Diagnosis not present

## 2015-10-18 DIAGNOSIS — Z23 Encounter for immunization: Secondary | ICD-10-CM | POA: Diagnosis not present

## 2015-10-18 DIAGNOSIS — N2581 Secondary hyperparathyroidism of renal origin: Secondary | ICD-10-CM | POA: Diagnosis not present

## 2015-10-18 DIAGNOSIS — D508 Other iron deficiency anemias: Secondary | ICD-10-CM | POA: Diagnosis not present

## 2015-10-20 DIAGNOSIS — N2581 Secondary hyperparathyroidism of renal origin: Secondary | ICD-10-CM | POA: Diagnosis not present

## 2015-10-20 DIAGNOSIS — Z23 Encounter for immunization: Secondary | ICD-10-CM | POA: Diagnosis not present

## 2015-10-20 DIAGNOSIS — D508 Other iron deficiency anemias: Secondary | ICD-10-CM | POA: Diagnosis not present

## 2015-10-20 DIAGNOSIS — N186 End stage renal disease: Secondary | ICD-10-CM | POA: Diagnosis not present

## 2015-10-20 DIAGNOSIS — D631 Anemia in chronic kidney disease: Secondary | ICD-10-CM | POA: Diagnosis not present

## 2015-10-22 DIAGNOSIS — N2581 Secondary hyperparathyroidism of renal origin: Secondary | ICD-10-CM | POA: Diagnosis not present

## 2015-10-22 DIAGNOSIS — N186 End stage renal disease: Secondary | ICD-10-CM | POA: Diagnosis not present

## 2015-10-22 DIAGNOSIS — D631 Anemia in chronic kidney disease: Secondary | ICD-10-CM | POA: Diagnosis not present

## 2015-10-22 DIAGNOSIS — D508 Other iron deficiency anemias: Secondary | ICD-10-CM | POA: Diagnosis not present

## 2015-10-22 DIAGNOSIS — Z23 Encounter for immunization: Secondary | ICD-10-CM | POA: Diagnosis not present

## 2015-10-25 DIAGNOSIS — D508 Other iron deficiency anemias: Secondary | ICD-10-CM | POA: Diagnosis not present

## 2015-10-25 DIAGNOSIS — Z23 Encounter for immunization: Secondary | ICD-10-CM | POA: Diagnosis not present

## 2015-10-25 DIAGNOSIS — N2581 Secondary hyperparathyroidism of renal origin: Secondary | ICD-10-CM | POA: Diagnosis not present

## 2015-10-25 DIAGNOSIS — D631 Anemia in chronic kidney disease: Secondary | ICD-10-CM | POA: Diagnosis not present

## 2015-10-25 DIAGNOSIS — N186 End stage renal disease: Secondary | ICD-10-CM | POA: Diagnosis not present

## 2015-10-27 DIAGNOSIS — N186 End stage renal disease: Secondary | ICD-10-CM | POA: Diagnosis not present

## 2015-10-27 DIAGNOSIS — Z23 Encounter for immunization: Secondary | ICD-10-CM | POA: Diagnosis not present

## 2015-10-27 DIAGNOSIS — D631 Anemia in chronic kidney disease: Secondary | ICD-10-CM | POA: Diagnosis not present

## 2015-10-27 DIAGNOSIS — N2581 Secondary hyperparathyroidism of renal origin: Secondary | ICD-10-CM | POA: Diagnosis not present

## 2015-10-27 DIAGNOSIS — D508 Other iron deficiency anemias: Secondary | ICD-10-CM | POA: Diagnosis not present

## 2015-10-29 DIAGNOSIS — D631 Anemia in chronic kidney disease: Secondary | ICD-10-CM | POA: Diagnosis not present

## 2015-10-29 DIAGNOSIS — D508 Other iron deficiency anemias: Secondary | ICD-10-CM | POA: Diagnosis not present

## 2015-10-29 DIAGNOSIS — N186 End stage renal disease: Secondary | ICD-10-CM | POA: Diagnosis not present

## 2015-10-29 DIAGNOSIS — N2581 Secondary hyperparathyroidism of renal origin: Secondary | ICD-10-CM | POA: Diagnosis not present

## 2015-10-29 DIAGNOSIS — Z23 Encounter for immunization: Secondary | ICD-10-CM | POA: Diagnosis not present

## 2015-11-01 DIAGNOSIS — N2581 Secondary hyperparathyroidism of renal origin: Secondary | ICD-10-CM | POA: Diagnosis not present

## 2015-11-01 DIAGNOSIS — D631 Anemia in chronic kidney disease: Secondary | ICD-10-CM | POA: Diagnosis not present

## 2015-11-01 DIAGNOSIS — D508 Other iron deficiency anemias: Secondary | ICD-10-CM | POA: Diagnosis not present

## 2015-11-01 DIAGNOSIS — Z23 Encounter for immunization: Secondary | ICD-10-CM | POA: Diagnosis not present

## 2015-11-01 DIAGNOSIS — N186 End stage renal disease: Secondary | ICD-10-CM | POA: Diagnosis not present

## 2015-11-02 DIAGNOSIS — Z992 Dependence on renal dialysis: Secondary | ICD-10-CM | POA: Diagnosis not present

## 2015-11-02 DIAGNOSIS — N185 Chronic kidney disease, stage 5: Secondary | ICD-10-CM | POA: Diagnosis not present

## 2015-11-02 DIAGNOSIS — H9190 Unspecified hearing loss, unspecified ear: Secondary | ICD-10-CM | POA: Diagnosis not present

## 2015-11-02 DIAGNOSIS — I10 Essential (primary) hypertension: Secondary | ICD-10-CM | POA: Diagnosis not present

## 2015-11-02 DIAGNOSIS — E785 Hyperlipidemia, unspecified: Secondary | ICD-10-CM | POA: Diagnosis not present

## 2015-11-02 DIAGNOSIS — Z0000 Encounter for general adult medical examination without abnormal findings: Secondary | ICD-10-CM | POA: Diagnosis not present

## 2015-11-02 DIAGNOSIS — I120 Hypertensive chronic kidney disease with stage 5 chronic kidney disease or end stage renal disease: Secondary | ICD-10-CM | POA: Diagnosis not present

## 2015-11-02 DIAGNOSIS — Z1211 Encounter for screening for malignant neoplasm of colon: Secondary | ICD-10-CM | POA: Diagnosis not present

## 2015-11-02 DIAGNOSIS — Z79899 Other long term (current) drug therapy: Secondary | ICD-10-CM | POA: Diagnosis not present

## 2015-11-02 DIAGNOSIS — N186 End stage renal disease: Secondary | ICD-10-CM | POA: Diagnosis not present

## 2015-11-03 DIAGNOSIS — Z23 Encounter for immunization: Secondary | ICD-10-CM | POA: Diagnosis not present

## 2015-11-03 DIAGNOSIS — D631 Anemia in chronic kidney disease: Secondary | ICD-10-CM | POA: Diagnosis not present

## 2015-11-03 DIAGNOSIS — D508 Other iron deficiency anemias: Secondary | ICD-10-CM | POA: Diagnosis not present

## 2015-11-03 DIAGNOSIS — N2581 Secondary hyperparathyroidism of renal origin: Secondary | ICD-10-CM | POA: Diagnosis not present

## 2015-11-03 DIAGNOSIS — N186 End stage renal disease: Secondary | ICD-10-CM | POA: Diagnosis not present

## 2015-11-05 DIAGNOSIS — N2581 Secondary hyperparathyroidism of renal origin: Secondary | ICD-10-CM | POA: Diagnosis not present

## 2015-11-05 DIAGNOSIS — N186 End stage renal disease: Secondary | ICD-10-CM | POA: Diagnosis not present

## 2015-11-05 DIAGNOSIS — D508 Other iron deficiency anemias: Secondary | ICD-10-CM | POA: Diagnosis not present

## 2015-11-05 DIAGNOSIS — Z23 Encounter for immunization: Secondary | ICD-10-CM | POA: Diagnosis not present

## 2015-11-05 DIAGNOSIS — D631 Anemia in chronic kidney disease: Secondary | ICD-10-CM | POA: Diagnosis not present

## 2015-11-08 DIAGNOSIS — D508 Other iron deficiency anemias: Secondary | ICD-10-CM | POA: Diagnosis not present

## 2015-11-08 DIAGNOSIS — Z23 Encounter for immunization: Secondary | ICD-10-CM | POA: Diagnosis not present

## 2015-11-08 DIAGNOSIS — N186 End stage renal disease: Secondary | ICD-10-CM | POA: Diagnosis not present

## 2015-11-08 DIAGNOSIS — D631 Anemia in chronic kidney disease: Secondary | ICD-10-CM | POA: Diagnosis not present

## 2015-11-08 DIAGNOSIS — N2581 Secondary hyperparathyroidism of renal origin: Secondary | ICD-10-CM | POA: Diagnosis not present

## 2015-11-10 DIAGNOSIS — N2581 Secondary hyperparathyroidism of renal origin: Secondary | ICD-10-CM | POA: Diagnosis not present

## 2015-11-10 DIAGNOSIS — D508 Other iron deficiency anemias: Secondary | ICD-10-CM | POA: Diagnosis not present

## 2015-11-10 DIAGNOSIS — Z23 Encounter for immunization: Secondary | ICD-10-CM | POA: Diagnosis not present

## 2015-11-10 DIAGNOSIS — N186 End stage renal disease: Secondary | ICD-10-CM | POA: Diagnosis not present

## 2015-11-10 DIAGNOSIS — D631 Anemia in chronic kidney disease: Secondary | ICD-10-CM | POA: Diagnosis not present

## 2015-11-12 DIAGNOSIS — N2581 Secondary hyperparathyroidism of renal origin: Secondary | ICD-10-CM | POA: Diagnosis not present

## 2015-11-12 DIAGNOSIS — Z23 Encounter for immunization: Secondary | ICD-10-CM | POA: Diagnosis not present

## 2015-11-12 DIAGNOSIS — D631 Anemia in chronic kidney disease: Secondary | ICD-10-CM | POA: Diagnosis not present

## 2015-11-12 DIAGNOSIS — D508 Other iron deficiency anemias: Secondary | ICD-10-CM | POA: Diagnosis not present

## 2015-11-12 DIAGNOSIS — N186 End stage renal disease: Secondary | ICD-10-CM | POA: Diagnosis not present

## 2015-11-13 DIAGNOSIS — N2889 Other specified disorders of kidney and ureter: Secondary | ICD-10-CM | POA: Diagnosis not present

## 2015-11-13 DIAGNOSIS — Z992 Dependence on renal dialysis: Secondary | ICD-10-CM | POA: Diagnosis not present

## 2015-11-13 DIAGNOSIS — N186 End stage renal disease: Secondary | ICD-10-CM | POA: Diagnosis not present

## 2015-11-15 DIAGNOSIS — N2581 Secondary hyperparathyroidism of renal origin: Secondary | ICD-10-CM | POA: Diagnosis not present

## 2015-11-15 DIAGNOSIS — D631 Anemia in chronic kidney disease: Secondary | ICD-10-CM | POA: Diagnosis not present

## 2015-11-15 DIAGNOSIS — N186 End stage renal disease: Secondary | ICD-10-CM | POA: Diagnosis not present

## 2015-11-15 DIAGNOSIS — Z23 Encounter for immunization: Secondary | ICD-10-CM | POA: Diagnosis not present

## 2015-11-17 ENCOUNTER — Encounter: Payer: Self-pay | Admitting: Vascular Surgery

## 2015-11-17 DIAGNOSIS — D631 Anemia in chronic kidney disease: Secondary | ICD-10-CM | POA: Diagnosis not present

## 2015-11-17 DIAGNOSIS — Z23 Encounter for immunization: Secondary | ICD-10-CM | POA: Diagnosis not present

## 2015-11-17 DIAGNOSIS — N186 End stage renal disease: Secondary | ICD-10-CM | POA: Diagnosis not present

## 2015-11-17 DIAGNOSIS — N2581 Secondary hyperparathyroidism of renal origin: Secondary | ICD-10-CM | POA: Diagnosis not present

## 2015-11-18 ENCOUNTER — Ambulatory Visit (INDEPENDENT_AMBULATORY_CARE_PROVIDER_SITE_OTHER): Payer: Medicare Other | Admitting: Vascular Surgery

## 2015-11-18 ENCOUNTER — Encounter: Payer: Self-pay | Admitting: Vascular Surgery

## 2015-11-18 VITALS — BP 120/60 | HR 81 | Temp 98.2°F | Resp 16 | Ht 71.0 in | Wt 181.0 lb

## 2015-11-18 DIAGNOSIS — Z992 Dependence on renal dialysis: Secondary | ICD-10-CM | POA: Diagnosis not present

## 2015-11-18 DIAGNOSIS — N186 End stage renal disease: Secondary | ICD-10-CM

## 2015-11-18 NOTE — Progress Notes (Signed)
History of Present Illness:  Patient is a 65 y.o. year old female who presents with an ulcer over the proximal 1/3 of her left UE AV fistula that appeared 4 days ago.  She states that if the scab appears they stop sticking it in that spot.  She has had 1 episode of bleeding in the past 2 years.   Past medical history has not changed since her last visit in 06/08/2015.    Past Medical History:  Diagnosis Date  . Abnormal Pap smear    ASCUS ?HGSIL  . Cervical dysplasia   . Chronic kidney disease   . Constipation   . Constipation   . Gout   . Hypertension   . Obesity   . Uterine polyp     Past Surgical History:  Procedure Laterality Date  . AV FISTULA PLACEMENT  01/18/2012   Procedure: ARTERIOVENOUS (AV) FISTULA CREATION;  Surgeon: Angelia Mould, MD;  Location: [place];  Service: Vascular;  Laterality: Left;  . DILATION AND CURETTAGE OF UTERUS    . HYSTEROSCOPY    . LEEP    . LEFT HEART CATHETERIZATION WITH CORONARY ANGIOGRAM N/A 08/26/2013   Procedure: LEFT HEART CATHETERIZATION WITH CORONARY ANGIOGRAM;  Surgeon: Leonie Man, MD;  Location: [facility] CATH LAB;  Service: Cardiovascular;  Laterality: N/A;     Social History Social History  Substance Use Topics  . Smoking status: Current Every Day Smoker    Packs/day: 1.00    Years: 30.00    Types: Cigarettes  . Smokeless tobacco: Never Used  . Alcohol use 3.6 oz/week    6 Cans of beer per week    Family History Family History  Problem Relation Age of Onset  . Colon cancer Neg Hx   . Esophageal cancer    . Pancreatic cancer    . Heart disease Mother   . Hypertension Mother   . Breast cancer Sister   . Esophageal cancer Sister   . Cancer Sister   . Deep vein thrombosis Sister   . Diabetes Sister   . Hypertension Sister   . Hypertension Daughter     Allergies  No Known Allergies   Current Outpatient Prescriptions  Medication Sig Dispense Refill  . allopurinol (ZYLOPRIM) 100 MG tablet Take 100 mg by  mouth daily.      Marland Kitchen aspirin EC 325 MG tablet Take 1 tablet (325 mg total) by mouth daily. 30 tablet 0  . atenolol (TENORMIN) 100 MG tablet Take 1 tablet (100 mg total) by mouth daily. 60 tablet 0  . atorvastatin (LIPITOR) 10 MG tablet Take 1 tablet (10 mg total) by mouth daily. Can use any approved statin same dose 30 tablet 0  . Multiple Vitamin (MULTIVITAMIN WITH MINERALS) TABS tablet Take 1 tablet by mouth daily. 30 tablet 0  . sodium bicarbonate 650 MG tablet Take 650 mg by mouth 3 (three) times daily.    Marland Kitchen thiamine 100 MG tablet Take 1 tablet (100 mg total) by mouth daily. 30 tablet 0   Current Facility-Administered Medications  Medication Dose Route Frequency Provider Last Rate Last Dose  . 0.9 %  sodium chloride infusion  500 mL Intravenous Continuous Inda Castle, MD        ROS:   General:  No weight loss, Fever, chills  HEENT: No recent headaches, no nasal bleeding, no visual changes, no sore throat  Neurologic: No dizziness, blackouts, seizures. No recent symptoms of stroke or mini- stroke. No recent episodes  of slurred speech, or temporary blindness.  Cardiac: No recent episodes of chest pain/pressure, no shortness of breath at rest.  No shortness of breath with exertion.  Denies history of atrial fibrillation or irregular heartbeat  Vascular: No history of rest pain in feet.  No history of claudication.  No history of non-healing ulcer, No history of DVT   Pulmonary: No home oxygen, no productive cough, no hemoptysis,  No asthma or wheezing  Musculoskeletal:  '[ ]'$  Arthritis, '[ ]'$  Low back pain,  '[ ]'$  Joint pain  Hematologic:No history of hypercoagulable state.  No history of easy bleeding.  No history of anemia  Gastrointestinal: No hematochezia or melena,  No gastroesophageal reflux, no trouble swallowing  Urinary: [ x] chronic Kidney disease, [ x] on HD - [x ] MWF or '[ ]'$  TTHS, '[ ]'$  Burning with urination, '[ ]'$  Frequent urination, '[ ]'$  Difficulty urinating;   Skin: No  rashes  Psychological: No history of anxiety,  No history of depression   Physical Examination  Vitals:   11/18/15 1437  BP: 120/60  Pulse: 81  Resp: 16  Temp: 98.2 F (36.8 C)  TempSrc: Oral  SpO2: 97%  Weight: 181 lb (82.1 kg)  Height: '5\' 11"'$  (1.803 m)    Body mass index is 25.24 kg/m.  General:  Alert and oriented, no acute distress HEENT: Normal Neck: No bruit or JVD Pulmonary: Clear to auscultation bilaterally Cardiac: Regular Rate and Rhythm without murmur Gastrointestinal: Soft, non-tender, non-distended, no mass, no scars Skin: Left proximal fistula with eschar/scab without activebleeding Extremity Pulses:  2+ radial, brachial pulses bilaterally Musculoskeletal: No deformity or edema  Neurologic: Upper and lower extremity motor 5/5 and symmetric     ASSESSMENT:  ESRD with eschar/scab on fistula Left UE   PLAN: Dr. Oneida Alar recommended revision of the fistula.  She wants to think about it.  He demonstrated where to hold pressure if it bleeds and advised her to dial [phone] if this occurs.  She will call us to schedule a revision when she has had time to consider it.  Theda Sers, EMMA MAUREEN PA-C [facility] [place]   The patient was seen in conjunction with Dr. Oneida Alar today  Patient has eschar in the midportion of her fistula with thinned out skin. She is a risk of bleeding from this. I discussed this at length with the patient. Currently she wishes to think about whether or not she wishes to have the fistula revised. She will call us in the future if she wishes to have this revised. She was instructed today on how to control bleeding if this breaks loose. I did discuss with her that she could potentially bleed to death from the fistula if this bleeding occurs. She understands this and understands the risk involved with waiting to repair the fistula.  Ruta Hinds, MD [facility] [place] Office:  [phone] Pager: [phone]

## 2015-11-19 DIAGNOSIS — Z23 Encounter for immunization: Secondary | ICD-10-CM | POA: Diagnosis not present

## 2015-11-19 DIAGNOSIS — N2581 Secondary hyperparathyroidism of renal origin: Secondary | ICD-10-CM | POA: Diagnosis not present

## 2015-11-19 DIAGNOSIS — N186 End stage renal disease: Secondary | ICD-10-CM | POA: Diagnosis not present

## 2015-11-19 DIAGNOSIS — D631 Anemia in chronic kidney disease: Secondary | ICD-10-CM | POA: Diagnosis not present

## 2015-11-22 DIAGNOSIS — D631 Anemia in chronic kidney disease: Secondary | ICD-10-CM | POA: Diagnosis not present

## 2015-11-22 DIAGNOSIS — N2581 Secondary hyperparathyroidism of renal origin: Secondary | ICD-10-CM | POA: Diagnosis not present

## 2015-11-22 DIAGNOSIS — N186 End stage renal disease: Secondary | ICD-10-CM | POA: Diagnosis not present

## 2015-11-22 DIAGNOSIS — Z23 Encounter for immunization: Secondary | ICD-10-CM | POA: Diagnosis not present

## 2015-11-24 DIAGNOSIS — N186 End stage renal disease: Secondary | ICD-10-CM | POA: Diagnosis not present

## 2015-11-24 DIAGNOSIS — D631 Anemia in chronic kidney disease: Secondary | ICD-10-CM | POA: Diagnosis not present

## 2015-11-24 DIAGNOSIS — N2581 Secondary hyperparathyroidism of renal origin: Secondary | ICD-10-CM | POA: Diagnosis not present

## 2015-11-24 DIAGNOSIS — Z23 Encounter for immunization: Secondary | ICD-10-CM | POA: Diagnosis not present

## 2015-11-26 DIAGNOSIS — Z23 Encounter for immunization: Secondary | ICD-10-CM | POA: Diagnosis not present

## 2015-11-26 DIAGNOSIS — D631 Anemia in chronic kidney disease: Secondary | ICD-10-CM | POA: Diagnosis not present

## 2015-11-26 DIAGNOSIS — N186 End stage renal disease: Secondary | ICD-10-CM | POA: Diagnosis not present

## 2015-11-26 DIAGNOSIS — N2581 Secondary hyperparathyroidism of renal origin: Secondary | ICD-10-CM | POA: Diagnosis not present

## 2015-11-29 DIAGNOSIS — N2581 Secondary hyperparathyroidism of renal origin: Secondary | ICD-10-CM | POA: Diagnosis not present

## 2015-11-29 DIAGNOSIS — Z23 Encounter for immunization: Secondary | ICD-10-CM | POA: Diagnosis not present

## 2015-11-29 DIAGNOSIS — D631 Anemia in chronic kidney disease: Secondary | ICD-10-CM | POA: Diagnosis not present

## 2015-11-29 DIAGNOSIS — N186 End stage renal disease: Secondary | ICD-10-CM | POA: Diagnosis not present

## 2015-12-01 DIAGNOSIS — N186 End stage renal disease: Secondary | ICD-10-CM | POA: Diagnosis not present

## 2015-12-01 DIAGNOSIS — Z23 Encounter for immunization: Secondary | ICD-10-CM | POA: Diagnosis not present

## 2015-12-01 DIAGNOSIS — N2581 Secondary hyperparathyroidism of renal origin: Secondary | ICD-10-CM | POA: Diagnosis not present

## 2015-12-01 DIAGNOSIS — D631 Anemia in chronic kidney disease: Secondary | ICD-10-CM | POA: Diagnosis not present

## 2015-12-03 DIAGNOSIS — D631 Anemia in chronic kidney disease: Secondary | ICD-10-CM | POA: Diagnosis not present

## 2015-12-03 DIAGNOSIS — Z23 Encounter for immunization: Secondary | ICD-10-CM | POA: Diagnosis not present

## 2015-12-03 DIAGNOSIS — N2581 Secondary hyperparathyroidism of renal origin: Secondary | ICD-10-CM | POA: Diagnosis not present

## 2015-12-03 DIAGNOSIS — N186 End stage renal disease: Secondary | ICD-10-CM | POA: Diagnosis not present

## 2015-12-06 DIAGNOSIS — N186 End stage renal disease: Secondary | ICD-10-CM | POA: Diagnosis not present

## 2015-12-06 DIAGNOSIS — Z23 Encounter for immunization: Secondary | ICD-10-CM | POA: Diagnosis not present

## 2015-12-06 DIAGNOSIS — D631 Anemia in chronic kidney disease: Secondary | ICD-10-CM | POA: Diagnosis not present

## 2015-12-06 DIAGNOSIS — N2581 Secondary hyperparathyroidism of renal origin: Secondary | ICD-10-CM | POA: Diagnosis not present

## 2015-12-08 DIAGNOSIS — N2581 Secondary hyperparathyroidism of renal origin: Secondary | ICD-10-CM | POA: Diagnosis not present

## 2015-12-08 DIAGNOSIS — N186 End stage renal disease: Secondary | ICD-10-CM | POA: Diagnosis not present

## 2015-12-08 DIAGNOSIS — Z23 Encounter for immunization: Secondary | ICD-10-CM | POA: Diagnosis not present

## 2015-12-08 DIAGNOSIS — D631 Anemia in chronic kidney disease: Secondary | ICD-10-CM | POA: Diagnosis not present

## 2015-12-10 DIAGNOSIS — N186 End stage renal disease: Secondary | ICD-10-CM | POA: Diagnosis not present

## 2015-12-10 DIAGNOSIS — Z23 Encounter for immunization: Secondary | ICD-10-CM | POA: Diagnosis not present

## 2015-12-10 DIAGNOSIS — N2581 Secondary hyperparathyroidism of renal origin: Secondary | ICD-10-CM | POA: Diagnosis not present

## 2015-12-10 DIAGNOSIS — D631 Anemia in chronic kidney disease: Secondary | ICD-10-CM | POA: Diagnosis not present

## 2015-12-13 DIAGNOSIS — N186 End stage renal disease: Secondary | ICD-10-CM | POA: Diagnosis not present

## 2015-12-13 DIAGNOSIS — N2581 Secondary hyperparathyroidism of renal origin: Secondary | ICD-10-CM | POA: Diagnosis not present

## 2015-12-13 DIAGNOSIS — D631 Anemia in chronic kidney disease: Secondary | ICD-10-CM | POA: Diagnosis not present

## 2015-12-13 DIAGNOSIS — Z23 Encounter for immunization: Secondary | ICD-10-CM | POA: Diagnosis not present

## 2015-12-14 DIAGNOSIS — N186 End stage renal disease: Secondary | ICD-10-CM | POA: Diagnosis not present

## 2015-12-14 DIAGNOSIS — Z992 Dependence on renal dialysis: Secondary | ICD-10-CM | POA: Diagnosis not present

## 2015-12-14 DIAGNOSIS — N2889 Other specified disorders of kidney and ureter: Secondary | ICD-10-CM | POA: Diagnosis not present

## 2015-12-15 DIAGNOSIS — D631 Anemia in chronic kidney disease: Secondary | ICD-10-CM | POA: Diagnosis not present

## 2015-12-15 DIAGNOSIS — N2581 Secondary hyperparathyroidism of renal origin: Secondary | ICD-10-CM | POA: Diagnosis not present

## 2015-12-15 DIAGNOSIS — N186 End stage renal disease: Secondary | ICD-10-CM | POA: Diagnosis not present

## 2015-12-15 DIAGNOSIS — D509 Iron deficiency anemia, unspecified: Secondary | ICD-10-CM | POA: Diagnosis not present

## 2015-12-16 ENCOUNTER — Ambulatory Visit: Payer: Medicare Other | Admitting: Vascular Surgery

## 2015-12-16 ENCOUNTER — Encounter: Payer: Self-pay | Admitting: Vascular Surgery

## 2015-12-17 DIAGNOSIS — D631 Anemia in chronic kidney disease: Secondary | ICD-10-CM | POA: Diagnosis not present

## 2015-12-17 DIAGNOSIS — N186 End stage renal disease: Secondary | ICD-10-CM | POA: Diagnosis not present

## 2015-12-17 DIAGNOSIS — N2581 Secondary hyperparathyroidism of renal origin: Secondary | ICD-10-CM | POA: Diagnosis not present

## 2015-12-17 DIAGNOSIS — D509 Iron deficiency anemia, unspecified: Secondary | ICD-10-CM | POA: Diagnosis not present

## 2015-12-20 DIAGNOSIS — N186 End stage renal disease: Secondary | ICD-10-CM | POA: Diagnosis not present

## 2015-12-20 DIAGNOSIS — D631 Anemia in chronic kidney disease: Secondary | ICD-10-CM | POA: Diagnosis not present

## 2015-12-20 DIAGNOSIS — N2581 Secondary hyperparathyroidism of renal origin: Secondary | ICD-10-CM | POA: Diagnosis not present

## 2015-12-20 DIAGNOSIS — D509 Iron deficiency anemia, unspecified: Secondary | ICD-10-CM | POA: Diagnosis not present

## 2015-12-22 DIAGNOSIS — D631 Anemia in chronic kidney disease: Secondary | ICD-10-CM | POA: Diagnosis not present

## 2015-12-22 DIAGNOSIS — D509 Iron deficiency anemia, unspecified: Secondary | ICD-10-CM | POA: Diagnosis not present

## 2015-12-22 DIAGNOSIS — N186 End stage renal disease: Secondary | ICD-10-CM | POA: Diagnosis not present

## 2015-12-22 DIAGNOSIS — N2581 Secondary hyperparathyroidism of renal origin: Secondary | ICD-10-CM | POA: Diagnosis not present

## 2015-12-24 DIAGNOSIS — D509 Iron deficiency anemia, unspecified: Secondary | ICD-10-CM | POA: Diagnosis not present

## 2015-12-24 DIAGNOSIS — N2581 Secondary hyperparathyroidism of renal origin: Secondary | ICD-10-CM | POA: Diagnosis not present

## 2015-12-24 DIAGNOSIS — N186 End stage renal disease: Secondary | ICD-10-CM | POA: Diagnosis not present

## 2015-12-24 DIAGNOSIS — D631 Anemia in chronic kidney disease: Secondary | ICD-10-CM | POA: Diagnosis not present

## 2015-12-27 DIAGNOSIS — D631 Anemia in chronic kidney disease: Secondary | ICD-10-CM | POA: Diagnosis not present

## 2015-12-27 DIAGNOSIS — D509 Iron deficiency anemia, unspecified: Secondary | ICD-10-CM | POA: Diagnosis not present

## 2015-12-27 DIAGNOSIS — N186 End stage renal disease: Secondary | ICD-10-CM | POA: Diagnosis not present

## 2015-12-27 DIAGNOSIS — N2581 Secondary hyperparathyroidism of renal origin: Secondary | ICD-10-CM | POA: Diagnosis not present

## 2015-12-29 DIAGNOSIS — D631 Anemia in chronic kidney disease: Secondary | ICD-10-CM | POA: Diagnosis not present

## 2015-12-29 DIAGNOSIS — N2581 Secondary hyperparathyroidism of renal origin: Secondary | ICD-10-CM | POA: Diagnosis not present

## 2015-12-29 DIAGNOSIS — D509 Iron deficiency anemia, unspecified: Secondary | ICD-10-CM | POA: Diagnosis not present

## 2015-12-29 DIAGNOSIS — N186 End stage renal disease: Secondary | ICD-10-CM | POA: Diagnosis not present

## 2015-12-31 DIAGNOSIS — D631 Anemia in chronic kidney disease: Secondary | ICD-10-CM | POA: Diagnosis not present

## 2015-12-31 DIAGNOSIS — N186 End stage renal disease: Secondary | ICD-10-CM | POA: Diagnosis not present

## 2015-12-31 DIAGNOSIS — D509 Iron deficiency anemia, unspecified: Secondary | ICD-10-CM | POA: Diagnosis not present

## 2015-12-31 DIAGNOSIS — N2581 Secondary hyperparathyroidism of renal origin: Secondary | ICD-10-CM | POA: Diagnosis not present

## 2016-01-04 DIAGNOSIS — D509 Iron deficiency anemia, unspecified: Secondary | ICD-10-CM | POA: Diagnosis not present

## 2016-01-04 DIAGNOSIS — N2581 Secondary hyperparathyroidism of renal origin: Secondary | ICD-10-CM | POA: Diagnosis not present

## 2016-01-04 DIAGNOSIS — N186 End stage renal disease: Secondary | ICD-10-CM | POA: Diagnosis not present

## 2016-01-04 DIAGNOSIS — D631 Anemia in chronic kidney disease: Secondary | ICD-10-CM | POA: Diagnosis not present

## 2016-01-07 DIAGNOSIS — N2581 Secondary hyperparathyroidism of renal origin: Secondary | ICD-10-CM | POA: Diagnosis not present

## 2016-01-07 DIAGNOSIS — D509 Iron deficiency anemia, unspecified: Secondary | ICD-10-CM | POA: Diagnosis not present

## 2016-01-07 DIAGNOSIS — N186 End stage renal disease: Secondary | ICD-10-CM | POA: Diagnosis not present

## 2016-01-07 DIAGNOSIS — D631 Anemia in chronic kidney disease: Secondary | ICD-10-CM | POA: Diagnosis not present

## 2016-01-10 DIAGNOSIS — D509 Iron deficiency anemia, unspecified: Secondary | ICD-10-CM | POA: Diagnosis not present

## 2016-01-10 DIAGNOSIS — N186 End stage renal disease: Secondary | ICD-10-CM | POA: Diagnosis not present

## 2016-01-10 DIAGNOSIS — D631 Anemia in chronic kidney disease: Secondary | ICD-10-CM | POA: Diagnosis not present

## 2016-01-10 DIAGNOSIS — N2581 Secondary hyperparathyroidism of renal origin: Secondary | ICD-10-CM | POA: Diagnosis not present

## 2016-01-12 DIAGNOSIS — N186 End stage renal disease: Secondary | ICD-10-CM | POA: Diagnosis not present

## 2016-01-12 DIAGNOSIS — D509 Iron deficiency anemia, unspecified: Secondary | ICD-10-CM | POA: Diagnosis not present

## 2016-01-12 DIAGNOSIS — D631 Anemia in chronic kidney disease: Secondary | ICD-10-CM | POA: Diagnosis not present

## 2016-01-12 DIAGNOSIS — N2581 Secondary hyperparathyroidism of renal origin: Secondary | ICD-10-CM | POA: Diagnosis not present

## 2016-01-13 DIAGNOSIS — N2889 Other specified disorders of kidney and ureter: Secondary | ICD-10-CM | POA: Diagnosis not present

## 2016-01-13 DIAGNOSIS — Z992 Dependence on renal dialysis: Secondary | ICD-10-CM | POA: Diagnosis not present

## 2016-01-13 DIAGNOSIS — N186 End stage renal disease: Secondary | ICD-10-CM | POA: Diagnosis not present

## 2016-01-14 DIAGNOSIS — D509 Iron deficiency anemia, unspecified: Secondary | ICD-10-CM | POA: Diagnosis not present

## 2016-01-14 DIAGNOSIS — N2581 Secondary hyperparathyroidism of renal origin: Secondary | ICD-10-CM | POA: Diagnosis not present

## 2016-01-14 DIAGNOSIS — N186 End stage renal disease: Secondary | ICD-10-CM | POA: Diagnosis not present

## 2016-01-14 DIAGNOSIS — D631 Anemia in chronic kidney disease: Secondary | ICD-10-CM | POA: Diagnosis not present

## 2016-01-17 DIAGNOSIS — D509 Iron deficiency anemia, unspecified: Secondary | ICD-10-CM | POA: Diagnosis not present

## 2016-01-17 DIAGNOSIS — N186 End stage renal disease: Secondary | ICD-10-CM | POA: Diagnosis not present

## 2016-01-17 DIAGNOSIS — N2581 Secondary hyperparathyroidism of renal origin: Secondary | ICD-10-CM | POA: Diagnosis not present

## 2016-01-17 DIAGNOSIS — D631 Anemia in chronic kidney disease: Secondary | ICD-10-CM | POA: Diagnosis not present

## 2016-01-19 DIAGNOSIS — N2581 Secondary hyperparathyroidism of renal origin: Secondary | ICD-10-CM | POA: Diagnosis not present

## 2016-01-19 DIAGNOSIS — N186 End stage renal disease: Secondary | ICD-10-CM | POA: Diagnosis not present

## 2016-01-19 DIAGNOSIS — D631 Anemia in chronic kidney disease: Secondary | ICD-10-CM | POA: Diagnosis not present

## 2016-01-19 DIAGNOSIS — D509 Iron deficiency anemia, unspecified: Secondary | ICD-10-CM | POA: Diagnosis not present

## 2016-01-21 DIAGNOSIS — N2581 Secondary hyperparathyroidism of renal origin: Secondary | ICD-10-CM | POA: Diagnosis not present

## 2016-01-21 DIAGNOSIS — D631 Anemia in chronic kidney disease: Secondary | ICD-10-CM | POA: Diagnosis not present

## 2016-01-21 DIAGNOSIS — D509 Iron deficiency anemia, unspecified: Secondary | ICD-10-CM | POA: Diagnosis not present

## 2016-01-21 DIAGNOSIS — N186 End stage renal disease: Secondary | ICD-10-CM | POA: Diagnosis not present

## 2016-01-24 DIAGNOSIS — N186 End stage renal disease: Secondary | ICD-10-CM | POA: Diagnosis not present

## 2016-01-24 DIAGNOSIS — N2581 Secondary hyperparathyroidism of renal origin: Secondary | ICD-10-CM | POA: Diagnosis not present

## 2016-01-24 DIAGNOSIS — D631 Anemia in chronic kidney disease: Secondary | ICD-10-CM | POA: Diagnosis not present

## 2016-01-24 DIAGNOSIS — D509 Iron deficiency anemia, unspecified: Secondary | ICD-10-CM | POA: Diagnosis not present

## 2016-01-26 DIAGNOSIS — N186 End stage renal disease: Secondary | ICD-10-CM | POA: Diagnosis not present

## 2016-01-26 DIAGNOSIS — D509 Iron deficiency anemia, unspecified: Secondary | ICD-10-CM | POA: Diagnosis not present

## 2016-01-26 DIAGNOSIS — D631 Anemia in chronic kidney disease: Secondary | ICD-10-CM | POA: Diagnosis not present

## 2016-01-26 DIAGNOSIS — N2581 Secondary hyperparathyroidism of renal origin: Secondary | ICD-10-CM | POA: Diagnosis not present

## 2016-01-28 DIAGNOSIS — D509 Iron deficiency anemia, unspecified: Secondary | ICD-10-CM | POA: Diagnosis not present

## 2016-01-28 DIAGNOSIS — N186 End stage renal disease: Secondary | ICD-10-CM | POA: Diagnosis not present

## 2016-01-28 DIAGNOSIS — N2581 Secondary hyperparathyroidism of renal origin: Secondary | ICD-10-CM | POA: Diagnosis not present

## 2016-01-28 DIAGNOSIS — D631 Anemia in chronic kidney disease: Secondary | ICD-10-CM | POA: Diagnosis not present

## 2016-01-31 DIAGNOSIS — D509 Iron deficiency anemia, unspecified: Secondary | ICD-10-CM | POA: Diagnosis not present

## 2016-01-31 DIAGNOSIS — D631 Anemia in chronic kidney disease: Secondary | ICD-10-CM | POA: Diagnosis not present

## 2016-01-31 DIAGNOSIS — N2581 Secondary hyperparathyroidism of renal origin: Secondary | ICD-10-CM | POA: Diagnosis not present

## 2016-01-31 DIAGNOSIS — N186 End stage renal disease: Secondary | ICD-10-CM | POA: Diagnosis not present

## 2016-02-02 DIAGNOSIS — N2581 Secondary hyperparathyroidism of renal origin: Secondary | ICD-10-CM | POA: Diagnosis not present

## 2016-02-02 DIAGNOSIS — N186 End stage renal disease: Secondary | ICD-10-CM | POA: Diagnosis not present

## 2016-02-02 DIAGNOSIS — D509 Iron deficiency anemia, unspecified: Secondary | ICD-10-CM | POA: Diagnosis not present

## 2016-02-02 DIAGNOSIS — D631 Anemia in chronic kidney disease: Secondary | ICD-10-CM | POA: Diagnosis not present

## 2016-02-04 DIAGNOSIS — D509 Iron deficiency anemia, unspecified: Secondary | ICD-10-CM | POA: Diagnosis not present

## 2016-02-04 DIAGNOSIS — D631 Anemia in chronic kidney disease: Secondary | ICD-10-CM | POA: Diagnosis not present

## 2016-02-04 DIAGNOSIS — N186 End stage renal disease: Secondary | ICD-10-CM | POA: Diagnosis not present

## 2016-02-04 DIAGNOSIS — N2581 Secondary hyperparathyroidism of renal origin: Secondary | ICD-10-CM | POA: Diagnosis not present

## 2016-02-06 DIAGNOSIS — D631 Anemia in chronic kidney disease: Secondary | ICD-10-CM | POA: Diagnosis not present

## 2016-02-06 DIAGNOSIS — N186 End stage renal disease: Secondary | ICD-10-CM | POA: Diagnosis not present

## 2016-02-06 DIAGNOSIS — N2581 Secondary hyperparathyroidism of renal origin: Secondary | ICD-10-CM | POA: Diagnosis not present

## 2016-02-06 DIAGNOSIS — D509 Iron deficiency anemia, unspecified: Secondary | ICD-10-CM | POA: Diagnosis not present

## 2016-02-09 DIAGNOSIS — N2581 Secondary hyperparathyroidism of renal origin: Secondary | ICD-10-CM | POA: Diagnosis not present

## 2016-02-09 DIAGNOSIS — D509 Iron deficiency anemia, unspecified: Secondary | ICD-10-CM | POA: Diagnosis not present

## 2016-02-09 DIAGNOSIS — N186 End stage renal disease: Secondary | ICD-10-CM | POA: Diagnosis not present

## 2016-02-09 DIAGNOSIS — D631 Anemia in chronic kidney disease: Secondary | ICD-10-CM | POA: Diagnosis not present

## 2016-02-11 DIAGNOSIS — D631 Anemia in chronic kidney disease: Secondary | ICD-10-CM | POA: Diagnosis not present

## 2016-02-11 DIAGNOSIS — D509 Iron deficiency anemia, unspecified: Secondary | ICD-10-CM | POA: Diagnosis not present

## 2016-02-11 DIAGNOSIS — N186 End stage renal disease: Secondary | ICD-10-CM | POA: Diagnosis not present

## 2016-02-11 DIAGNOSIS — N2581 Secondary hyperparathyroidism of renal origin: Secondary | ICD-10-CM | POA: Diagnosis not present

## 2016-02-13 DIAGNOSIS — D509 Iron deficiency anemia, unspecified: Secondary | ICD-10-CM | POA: Diagnosis not present

## 2016-02-13 DIAGNOSIS — D631 Anemia in chronic kidney disease: Secondary | ICD-10-CM | POA: Diagnosis not present

## 2016-02-13 DIAGNOSIS — Z992 Dependence on renal dialysis: Secondary | ICD-10-CM | POA: Diagnosis not present

## 2016-02-13 DIAGNOSIS — N186 End stage renal disease: Secondary | ICD-10-CM | POA: Diagnosis not present

## 2016-02-13 DIAGNOSIS — N2889 Other specified disorders of kidney and ureter: Secondary | ICD-10-CM | POA: Diagnosis not present

## 2016-02-13 DIAGNOSIS — N2581 Secondary hyperparathyroidism of renal origin: Secondary | ICD-10-CM | POA: Diagnosis not present

## 2016-02-16 DIAGNOSIS — N2581 Secondary hyperparathyroidism of renal origin: Secondary | ICD-10-CM | POA: Diagnosis not present

## 2016-02-16 DIAGNOSIS — D509 Iron deficiency anemia, unspecified: Secondary | ICD-10-CM | POA: Diagnosis not present

## 2016-02-16 DIAGNOSIS — D631 Anemia in chronic kidney disease: Secondary | ICD-10-CM | POA: Diagnosis not present

## 2016-02-16 DIAGNOSIS — N186 End stage renal disease: Secondary | ICD-10-CM | POA: Diagnosis not present

## 2016-02-18 DIAGNOSIS — D509 Iron deficiency anemia, unspecified: Secondary | ICD-10-CM | POA: Diagnosis not present

## 2016-02-18 DIAGNOSIS — N2581 Secondary hyperparathyroidism of renal origin: Secondary | ICD-10-CM | POA: Diagnosis not present

## 2016-02-18 DIAGNOSIS — N186 End stage renal disease: Secondary | ICD-10-CM | POA: Diagnosis not present

## 2016-02-18 DIAGNOSIS — D631 Anemia in chronic kidney disease: Secondary | ICD-10-CM | POA: Diagnosis not present

## 2016-02-23 DIAGNOSIS — N2581 Secondary hyperparathyroidism of renal origin: Secondary | ICD-10-CM | POA: Diagnosis not present

## 2016-02-23 DIAGNOSIS — D509 Iron deficiency anemia, unspecified: Secondary | ICD-10-CM | POA: Diagnosis not present

## 2016-02-23 DIAGNOSIS — N186 End stage renal disease: Secondary | ICD-10-CM | POA: Diagnosis not present

## 2016-02-23 DIAGNOSIS — D631 Anemia in chronic kidney disease: Secondary | ICD-10-CM | POA: Diagnosis not present

## 2016-02-25 DIAGNOSIS — D509 Iron deficiency anemia, unspecified: Secondary | ICD-10-CM | POA: Diagnosis not present

## 2016-02-25 DIAGNOSIS — N186 End stage renal disease: Secondary | ICD-10-CM | POA: Diagnosis not present

## 2016-02-25 DIAGNOSIS — D631 Anemia in chronic kidney disease: Secondary | ICD-10-CM | POA: Diagnosis not present

## 2016-02-25 DIAGNOSIS — N2581 Secondary hyperparathyroidism of renal origin: Secondary | ICD-10-CM | POA: Diagnosis not present

## 2016-02-29 ENCOUNTER — Encounter: Payer: Self-pay | Admitting: Vascular Surgery

## 2016-03-01 DIAGNOSIS — N2581 Secondary hyperparathyroidism of renal origin: Secondary | ICD-10-CM | POA: Diagnosis not present

## 2016-03-01 DIAGNOSIS — D631 Anemia in chronic kidney disease: Secondary | ICD-10-CM | POA: Diagnosis not present

## 2016-03-01 DIAGNOSIS — N186 End stage renal disease: Secondary | ICD-10-CM | POA: Diagnosis not present

## 2016-03-01 DIAGNOSIS — D509 Iron deficiency anemia, unspecified: Secondary | ICD-10-CM | POA: Diagnosis not present

## 2016-03-02 ENCOUNTER — Ambulatory Visit: Payer: Medicare Other | Admitting: Vascular Surgery

## 2016-03-03 DIAGNOSIS — N186 End stage renal disease: Secondary | ICD-10-CM | POA: Diagnosis not present

## 2016-03-03 DIAGNOSIS — N2581 Secondary hyperparathyroidism of renal origin: Secondary | ICD-10-CM | POA: Diagnosis not present

## 2016-03-03 DIAGNOSIS — D631 Anemia in chronic kidney disease: Secondary | ICD-10-CM | POA: Diagnosis not present

## 2016-03-03 DIAGNOSIS — D509 Iron deficiency anemia, unspecified: Secondary | ICD-10-CM | POA: Diagnosis not present

## 2016-03-06 DIAGNOSIS — D509 Iron deficiency anemia, unspecified: Secondary | ICD-10-CM | POA: Diagnosis not present

## 2016-03-06 DIAGNOSIS — N2581 Secondary hyperparathyroidism of renal origin: Secondary | ICD-10-CM | POA: Diagnosis not present

## 2016-03-06 DIAGNOSIS — N186 End stage renal disease: Secondary | ICD-10-CM | POA: Diagnosis not present

## 2016-03-06 DIAGNOSIS — D631 Anemia in chronic kidney disease: Secondary | ICD-10-CM | POA: Diagnosis not present

## 2016-03-08 DIAGNOSIS — D631 Anemia in chronic kidney disease: Secondary | ICD-10-CM | POA: Diagnosis not present

## 2016-03-08 DIAGNOSIS — D509 Iron deficiency anemia, unspecified: Secondary | ICD-10-CM | POA: Diagnosis not present

## 2016-03-08 DIAGNOSIS — N2581 Secondary hyperparathyroidism of renal origin: Secondary | ICD-10-CM | POA: Diagnosis not present

## 2016-03-08 DIAGNOSIS — N186 End stage renal disease: Secondary | ICD-10-CM | POA: Diagnosis not present

## 2016-03-09 ENCOUNTER — Ambulatory Visit (INDEPENDENT_AMBULATORY_CARE_PROVIDER_SITE_OTHER): Payer: Medicare Other | Admitting: Vascular Surgery

## 2016-03-09 ENCOUNTER — Encounter: Payer: Self-pay | Admitting: Vascular Surgery

## 2016-03-09 ENCOUNTER — Other Ambulatory Visit: Payer: Self-pay

## 2016-03-09 VITALS — BP 148/96 | HR 63 | Resp 18 | Ht 71.0 in | Wt 181.0 lb

## 2016-03-09 DIAGNOSIS — Z992 Dependence on renal dialysis: Secondary | ICD-10-CM

## 2016-03-09 DIAGNOSIS — N186 End stage renal disease: Secondary | ICD-10-CM | POA: Diagnosis not present

## 2016-03-09 NOTE — Progress Notes (Signed)
History of Present Illness:  Patient is a 66 y.o. year old female who presents with an ulcer over the proximal 1/3 of her left UE AV fistula that appeared several months ago.  She states that if the scab appears they stop sticking it in that spot.  She has had 1 episode of bleeding in the past 2 years.  I previously evaluated this in October 2017 and the patient declined intervention.  Past medical history has not changed since her last visit in 12/08/2015.         Past Medical History:  Diagnosis Date  . Abnormal Pap smear      ASCUS ?HGSIL  . Cervical dysplasia    . Chronic kidney disease    . Constipation    . Constipation    . Gout    . Hypertension    . Obesity    . Uterine polyp             Past Surgical History:  Procedure Laterality Date  . AV FISTULA PLACEMENT   01/18/2012    Procedure: ARTERIOVENOUS (AV) FISTULA CREATION;  Surgeon: Angelia Mould, MD;  Location: [place];  Service: Vascular;  Laterality: Left;  . DILATION AND CURETTAGE OF UTERUS      . HYSTEROSCOPY      . LEEP      . LEFT HEART CATHETERIZATION WITH CORONARY ANGIOGRAM N/A 08/26/2013    Procedure: LEFT HEART CATHETERIZATION WITH CORONARY ANGIOGRAM;  Surgeon: Leonie Man, MD;  Location: [facility] CATH LAB;  Service: Cardiovascular;  Laterality: N/A;        Social History       Social History  Substance Use Topics  . Smoking status: Current Every Day Smoker      Packs/day: 1.00      Years: 30.00      Types: Cigarettes  . Smokeless tobacco: Never Used  . Alcohol use 3.6 oz/week       6 Cans of beer per week       Family History      Family History  Problem Relation Age of Onset  . Colon cancer Neg Hx    . Esophageal cancer      . Pancreatic cancer      . Heart disease Mother    . Hypertension Mother    . Breast cancer Sister    . Esophageal cancer Sister    . Cancer Sister    . Deep vein thrombosis Sister    . Diabetes Sister    . Hypertension Sister    . Hypertension  Daughter        Allergies   No Known Allergies           Current Outpatient Prescriptions  Medication Sig Dispense Refill  . allopurinol (ZYLOPRIM) 100 MG tablet Take 100 mg by mouth daily.        Marland Kitchen aspirin EC 325 MG tablet Take 1 tablet (325 mg total) by mouth daily. 30 tablet 0  . atenolol (TENORMIN) 100 MG tablet Take 1 tablet (100 mg total) by mouth daily. 60 tablet 0  . atorvastatin (LIPITOR) 10 MG tablet Take 1 tablet (10 mg total) by mouth daily. Can use any approved statin same dose 30 tablet 0  . Multiple Vitamin (MULTIVITAMIN WITH MINERALS) TABS tablet Take 1 tablet by mouth daily. 30 tablet 0  . sodium bicarbonate 650 MG tablet Take 650 mg by mouth 3 (three) times daily.      Marland Kitchen  thiamine 100 MG tablet Take 1 tablet (100 mg total) by mouth daily. 30 tablet 0             Current Facility-Administered Medications  Medication Dose Route Frequency Provider Last Rate Last Dose  . 0.9 %  sodium chloride infusion  500 mL Intravenous Continuous Inda Castle, MD          ROS:    General:  No weight loss, Fever, chills   HEENT: No recent headaches, no nasal bleeding, no visual changes, no sore throat   Neurologic: No dizziness, blackouts, seizures. No recent symptoms of stroke or mini- stroke. No recent episodes of slurred speech, or temporary blindness.   Cardiac: No recent episodes of chest pain/pressure, no shortness of breath at rest.  No shortness of breath with exertion.  Denies history of atrial fibrillation or irregular heartbeat   Vascular: No history of rest pain in feet.  No history of claudication.  No history of non-healing ulcer, No history of DVT    Pulmonary: No home oxygen, no productive cough, no hemoptysis,  No asthma or wheezing   Musculoskeletal:  '[ ]'$  Arthritis, '[ ]'$  Low back pain,  '[ ]'$  Joint pain   Hematologic:No history of hypercoagulable state.  No history of easy bleeding.  No history of anemia   Gastrointestinal: No hematochezia or melena,  No  gastroesophageal reflux, no trouble swallowing   Urinary: [ x] chronic Kidney disease, [ x] on HD - [x ] MWF or '[ ]'$  TTHS, '[ ]'$  Burning with urination, '[ ]'$  Frequent urination, '[ ]'$  Difficulty urinating;    Skin: No rashes   Psychological: No history of anxiety,  No history of depression     Physical Examination    Vitals:   03/09/16 1339 03/09/16 1341  BP: (!) 147/83 (!) 148/96  Pulse: 63   Resp: 18   SpO2: 97%   Weight: 181 lb (82.1 kg)   Height: '5\' 11"'$  (1.803 m)      General:  Alert and oriented, no acute distress HEENT: Normal Neck: No bruit or JVD Pulmonary: Clear to auscultation bilaterally Cardiac: Regular Rate and Rhythm fistula bruit throughout precordium Gastrointestinal: Soft, non-tender, non-distended Skin: Left proximal fistula with eschar/scab without activebleeding 2 cm length ulceration mid fistula and and additional 2 mm ulcer just proximal to this Extremity Pulses:  2+ radial, brachial pulses bilaterally Musculoskeletal: No deformity or edema      Neurologic: Upper and lower extremity motor 5/5 and symmetric         ASSESSMENT:  ESRD with eschar/scab on fistula Left UE     PLAN:   Patient has eschar in the midportion of her fistula with thinned out skin. She is a risk of bleeding from this. I discussed this at length with the patient. She was instructed today on how to control bleeding if this breaks loose.  She is scheduled for revision of her fistula by Dr. Bridgett Larsson 03/14/2016.    Ruta Hinds, MD [facility] [place] Office: [phone] Pager: [phone]

## 2016-03-10 DIAGNOSIS — D631 Anemia in chronic kidney disease: Secondary | ICD-10-CM | POA: Diagnosis not present

## 2016-03-10 DIAGNOSIS — N186 End stage renal disease: Secondary | ICD-10-CM | POA: Diagnosis not present

## 2016-03-10 DIAGNOSIS — D509 Iron deficiency anemia, unspecified: Secondary | ICD-10-CM | POA: Diagnosis not present

## 2016-03-10 DIAGNOSIS — N2581 Secondary hyperparathyroidism of renal origin: Secondary | ICD-10-CM | POA: Diagnosis not present

## 2016-03-13 ENCOUNTER — Encounter (HOSPITAL_COMMUNITY): Payer: Self-pay | Admitting: *Deleted

## 2016-03-13 DIAGNOSIS — D631 Anemia in chronic kidney disease: Secondary | ICD-10-CM | POA: Diagnosis not present

## 2016-03-13 DIAGNOSIS — N186 End stage renal disease: Secondary | ICD-10-CM | POA: Diagnosis not present

## 2016-03-13 DIAGNOSIS — N2581 Secondary hyperparathyroidism of renal origin: Secondary | ICD-10-CM | POA: Diagnosis not present

## 2016-03-13 DIAGNOSIS — D509 Iron deficiency anemia, unspecified: Secondary | ICD-10-CM | POA: Diagnosis not present

## 2016-03-13 NOTE — Progress Notes (Signed)
Pt has dialysis on MWF; several unsuccessful attempts were made to contact pt. LVM on pt phone with pre-op instructions according to pre-op checklist. Pt advised to stop taking vitamins, fish oil and herbal medications. Do not take any NSAIDs ie: Ibuprofen, Advil, Naproxen , BC and Goody Powder. Please complete pt assessment on DOS.

## 2016-03-13 NOTE — Progress Notes (Signed)
Anesthesia Chart Review: Patient is a 66 year old female scheduled for revision of left AVF on 03/14/16 by Dr. Bridgett Larsson.  History includes ESRD (HD MWF; left brachiocephalic AVF 35/36/14), smoking, HTN, gout. On 08/25/13, she presented to the ED for HD hypoxic with frothy sputum that developed about 2 hours into her treatment. EKG showed possible inferolateral ischemia. She was put on Bi-Pap and started on Nitro gtt. Cardiology consulted. Echo showed EF 40-45% with new wall motion abnormality. Cath showed only mild CAD, non-ischemic cardiomyopathy.  PCP is not specified. Nephrologist is with [facility]. Cardiologist is Dr. Daneen Schick, last visit 09/08/13. He recommended ETOH cessation, hemodialysis compliance, but PRN cardiology follow-up.  Cardiac cath 08/26/13: Left Ventriculography: Deferred Coronary Anatomy: - Dominance: Right - Left Main: Large-caliber, short vessel that trifurcates into the LAD, Ramus Intermedius, and Circumflex. Mild calcification though angiographically normal. - LAD: Large-caliber vessel that wraps the apex perfusing the distal half of the infero-apex and inferior wall. There is a small first diagonal branch and a moderate caliber major second diagonal branch that actually perfuses the posterolateral wall. There is mild bridging in the mid LAD but no significant lesions in either the LAD or diagonal. A very small mild diagonal/ramus branch comes off proximally as roughly 60% stenosis there is hazy. This is not a very significant vessel. - Left Circumflex: Moderate to large caliber, nondominant vessel it gives off OM 1 prior to going to the AV groove. As it courses in the AV groove it become smaller in diameter it gives off a small posterior lateral branch. OM1 is moderate caliber with no significant disease. - Ramus intermedius: Large-caliber vessel that bifurcates distally. Tortuous but no significant lesions. - RCA: Large-caliber, likely dominant vessel with  diffuse disease throughout the mid vessel. It terminates distally with a very small posterior descending artery and RPL system. There is really minimal distal circulation beyond the bifurcation. No significant lesions noted. There is an very proximal RV marginal/atrial branch that comes off almost of an anomalous circumflex but does not course as such.   POST-OPERATIVE DIAGNOSIS:   - Mild, diffuse coronary disease but no obstructive lesions to explain positive Troponins for the anterior wall motion abnormality noted on echo. - Relatively normal LVEDP of 10 mm Hg PLAN OF CARE: - The patient will be transferred to the post procedural unit for monitoring of the groin post sheath removal. Anticipate that she will undergo dialysis either tonight or tomorrow. - Would continue aggressive risk factor modification and treatment of her cardiomyopathy the does not appear to be ischemic in nature. Continue aggressive blood pressure control.  Echo 08/25/13: Study Conclusions - Left ventricle: Systolic function was mildly to moderately reduced. The estimated ejection fraction was in the range of 40% to 45%. Hypokinesis of the mid-apicalanteroseptal, anterior, and apical myocardium. Due to tachycardia, there was fusion of early and atrial contributions to ventricular filling. - Mitral valve: There was mild regurgitation. - Pulmonary arteries: Systolic pressure was mildly increased. PA peak pressure: 45 mm Hg (S).  She is a same day work-up, so she will need labs and updated EKG on arrival. Further evaluation by her anesthesiologist on the day of surgery to ensure EKG and labs stable and no acute CV/CHF symptoms prior to proceeding.   George Hugh [facility] Short Stay Center/Anesthesiology Phone [phone] 03/13/2016 1:11 PM

## 2016-03-14 ENCOUNTER — Ambulatory Visit (HOSPITAL_COMMUNITY): Payer: Medicare Other | Admitting: Vascular Surgery

## 2016-03-14 ENCOUNTER — Encounter (HOSPITAL_COMMUNITY)
Admission: RE | Disposition: A | Discharge status: Home / Self Care | Payer: Self-pay | Source: Ambulatory Visit | Attending: Vascular Surgery

## 2016-03-14 ENCOUNTER — Encounter (HOSPITAL_COMMUNITY): Payer: Self-pay | Admitting: *Deleted

## 2016-03-14 ENCOUNTER — Ambulatory Visit (HOSPITAL_COMMUNITY)
Admission: RE | Admit: 2016-03-14 | Discharge: 2016-03-14 | Disposition: A | Discharge status: Home / Self Care | Payer: Medicare Other | Source: Ambulatory Visit | Attending: Vascular Surgery | Admitting: Vascular Surgery

## 2016-03-14 DIAGNOSIS — Z803 Family history of malignant neoplasm of breast: Secondary | ICD-10-CM | POA: Diagnosis not present

## 2016-03-14 DIAGNOSIS — Z809 Family history of malignant neoplasm, unspecified: Secondary | ICD-10-CM | POA: Insufficient documentation

## 2016-03-14 DIAGNOSIS — Z8249 Family history of ischemic heart disease and other diseases of the circulatory system: Secondary | ICD-10-CM | POA: Insufficient documentation

## 2016-03-14 DIAGNOSIS — E669 Obesity, unspecified: Secondary | ICD-10-CM | POA: Diagnosis not present

## 2016-03-14 DIAGNOSIS — I120 Hypertensive chronic kidney disease with stage 5 chronic kidney disease or end stage renal disease: Secondary | ICD-10-CM | POA: Diagnosis not present

## 2016-03-14 DIAGNOSIS — I252 Old myocardial infarction: Secondary | ICD-10-CM | POA: Insufficient documentation

## 2016-03-14 DIAGNOSIS — Z992 Dependence on renal dialysis: Secondary | ICD-10-CM | POA: Insufficient documentation

## 2016-03-14 DIAGNOSIS — I340 Nonrheumatic mitral (valve) insufficiency: Secondary | ICD-10-CM | POA: Insufficient documentation

## 2016-03-14 DIAGNOSIS — X58XXXA Exposure to other specified factors, initial encounter: Secondary | ICD-10-CM | POA: Insufficient documentation

## 2016-03-14 DIAGNOSIS — M109 Gout, unspecified: Secondary | ICD-10-CM | POA: Insufficient documentation

## 2016-03-14 DIAGNOSIS — L98491 Non-pressure chronic ulcer of skin of other sites limited to breakdown of skin: Secondary | ICD-10-CM | POA: Insufficient documentation

## 2016-03-14 DIAGNOSIS — Z79899 Other long term (current) drug therapy: Secondary | ICD-10-CM | POA: Diagnosis not present

## 2016-03-14 DIAGNOSIS — N186 End stage renal disease: Secondary | ICD-10-CM | POA: Insufficient documentation

## 2016-03-14 DIAGNOSIS — I2510 Atherosclerotic heart disease of native coronary artery without angina pectoris: Secondary | ICD-10-CM | POA: Insufficient documentation

## 2016-03-14 DIAGNOSIS — Z7982 Long term (current) use of aspirin: Secondary | ICD-10-CM | POA: Insufficient documentation

## 2016-03-14 DIAGNOSIS — T82898A Other specified complication of vascular prosthetic devices, implants and grafts, initial encounter: Secondary | ICD-10-CM | POA: Insufficient documentation

## 2016-03-14 DIAGNOSIS — I4510 Unspecified right bundle-branch block: Secondary | ICD-10-CM | POA: Diagnosis not present

## 2016-03-14 DIAGNOSIS — Z833 Family history of diabetes mellitus: Secondary | ICD-10-CM | POA: Diagnosis not present

## 2016-03-14 DIAGNOSIS — R000 Tachycardia, unspecified: Secondary | ICD-10-CM | POA: Insufficient documentation

## 2016-03-14 DIAGNOSIS — I728 Aneurysm of other specified arteries: Secondary | ICD-10-CM | POA: Insufficient documentation

## 2016-03-14 DIAGNOSIS — K5900 Constipation, unspecified: Secondary | ICD-10-CM | POA: Diagnosis not present

## 2016-03-14 DIAGNOSIS — Z6825 Body mass index (BMI) 25.0-25.9, adult: Secondary | ICD-10-CM | POA: Insufficient documentation

## 2016-03-14 DIAGNOSIS — Z800 Family history of malignant neoplasm of digestive organs: Secondary | ICD-10-CM | POA: Insufficient documentation

## 2016-03-14 DIAGNOSIS — F17210 Nicotine dependence, cigarettes, uncomplicated: Secondary | ICD-10-CM | POA: Insufficient documentation

## 2016-03-14 HISTORY — DX: Acute myocardial infarction, unspecified: I21.9

## 2016-03-14 HISTORY — DX: Pneumonia, unspecified organism: J18.9

## 2016-03-14 HISTORY — PX: REVISON OF ARTERIOVENOUS FISTULA: SHX6074

## 2016-03-14 HISTORY — DX: Hyperlipidemia, unspecified: E78.5

## 2016-03-14 HISTORY — DX: Anemia, unspecified: D64.9

## 2016-03-14 HISTORY — DX: Heart failure, unspecified: I50.9

## 2016-03-14 HISTORY — DX: Gastro-esophageal reflux disease without esophagitis: K21.9

## 2016-03-14 LAB — POCT I-STAT 4, (NA,K, GLUC, HGB,HCT)
Glucose, Bld: 86 mg/dL (ref 65–99)
HEMATOCRIT: 36 % (ref 36.0–46.0)
HEMOGLOBIN: 12.2 g/dL (ref 12.0–15.0)
POTASSIUM: 3.2 mmol/L — AB (ref 3.5–5.1)
SODIUM: 135 mmol/L (ref 135–145)

## 2016-03-14 SURGERY — REVISON OF ARTERIOVENOUS FISTULA
Anesthesia: General | Laterality: Left

## 2016-03-14 MED ORDER — HYDROMORPHONE HCL 1 MG/ML IJ SOLN
INTRAMUSCULAR | Status: AC
  Filled 2016-03-14: qty 1

## 2016-03-14 MED ORDER — PROTAMINE SULFATE 10 MG/ML IV SOLN
INTRAVENOUS | Status: AC
  Filled 2016-03-14: qty 5

## 2016-03-14 MED ORDER — PHENYLEPHRINE HCL 10 MG/ML IJ SOLN
INTRAMUSCULAR | Status: DC | PRN
  Administered 2016-03-14: 80 ug via INTRAVENOUS

## 2016-03-14 MED ORDER — EPHEDRINE SULFATE 50 MG/ML IJ SOLN
INTRAMUSCULAR | Status: DC | PRN
  Administered 2016-03-14 (×2): 5 mg via INTRAVENOUS

## 2016-03-14 MED ORDER — FENTANYL CITRATE (PF) 100 MCG/2ML IJ SOLN
INTRAMUSCULAR | Status: AC
  Filled 2016-03-14: qty 2

## 2016-03-14 MED ORDER — HEPARIN SODIUM (PORCINE) 1000 UNIT/ML IJ SOLN
INTRAMUSCULAR | Status: DC | PRN
  Administered 2016-03-14: 8000 [IU] via INTRAVENOUS

## 2016-03-14 MED ORDER — HEPARIN SODIUM (PORCINE) 1000 UNIT/ML IJ SOLN
INTRAMUSCULAR | Status: AC
  Filled 2016-03-14: qty 1

## 2016-03-14 MED ORDER — OXYCODONE-ACETAMINOPHEN 5-325 MG PO TABS: 1.0000 | ORAL_TABLET | Freq: Four times a day (QID) | ORAL | 0 refills | Status: DC | PRN

## 2016-03-14 MED ORDER — LIDOCAINE HCL (PF) 1 % IJ SOLN
INTRAMUSCULAR | Status: AC
  Filled 2016-03-14: qty 30

## 2016-03-14 MED ORDER — PROTAMINE SULFATE 10 MG/ML IV SOLN
INTRAVENOUS | Status: DC | PRN
  Administered 2016-03-14: 20 mg via INTRAVENOUS
  Administered 2016-03-14 (×2): 10 mg via INTRAVENOUS

## 2016-03-14 MED ORDER — PHENYLEPHRINE HCL 10 MG/ML IJ SOLN
INTRAVENOUS | Status: DC | PRN
  Administered 2016-03-14: 25 ug/min via INTRAVENOUS

## 2016-03-14 MED ORDER — ONDANSETRON HCL 4 MG/2ML IJ SOLN
INTRAMUSCULAR | Status: DC | PRN
  Administered 2016-03-14: 4 mg via INTRAVENOUS

## 2016-03-14 MED ORDER — SODIUM CHLORIDE 0.9 % IV SOLN
INTRAVENOUS | Status: DC
  Administered 2016-03-14: 11:00:00 via INTRAVENOUS

## 2016-03-14 MED ORDER — PROMETHAZINE HCL 25 MG/ML IJ SOLN: 6.2500 mg | INTRAMUSCULAR | Status: DC | PRN

## 2016-03-14 MED ORDER — 0.9 % SODIUM CHLORIDE (POUR BTL) OPTIME
TOPICAL | Status: DC | PRN
  Administered 2016-03-14: 1000 mL

## 2016-03-14 MED ORDER — DEXTROSE 5 % IV SOLN
1.5000 g | INTRAVENOUS | Status: AC
  Administered 2016-03-14: 1.5 g via INTRAVENOUS

## 2016-03-14 MED ORDER — HEMOSTATIC AGENTS (NO CHARGE) OPTIME
TOPICAL | Status: DC | PRN
  Administered 2016-03-14: 1 via TOPICAL

## 2016-03-14 MED ORDER — HYDROMORPHONE HCL 1 MG/ML IJ SOLN
0.2500 mg | INTRAMUSCULAR | Status: DC | PRN
  Administered 2016-03-14 (×3): 0.5 mg via INTRAVENOUS

## 2016-03-14 MED ORDER — FENTANYL CITRATE (PF) 100 MCG/2ML IJ SOLN
INTRAMUSCULAR | Status: DC | PRN
  Administered 2016-03-14: 50 ug via INTRAVENOUS
  Administered 2016-03-14: 25 ug via INTRAVENOUS
  Administered 2016-03-14 (×2): 50 ug via INTRAVENOUS
  Administered 2016-03-14: 25 ug via INTRAVENOUS

## 2016-03-14 MED ORDER — CHLORHEXIDINE GLUCONATE CLOTH 2 % EX PADS
6.0000 | MEDICATED_PAD | Freq: Once | CUTANEOUS | Status: AC
  Administered 2016-03-14: 6 via TOPICAL

## 2016-03-14 MED ORDER — PROPOFOL 10 MG/ML IV BOLUS
INTRAVENOUS | Status: AC
  Filled 2016-03-14: qty 20

## 2016-03-14 MED ORDER — DEXTROSE 5 % IV SOLN
INTRAVENOUS | Status: AC
  Filled 2016-03-14: qty 1.5

## 2016-03-14 SURGICAL SUPPLY — 42 items
ADH SKN CLS APL DERMABOND .7 (GAUZE/BANDAGES/DRESSINGS) ×1
AGENT HMST SPONGE THK3/8 (HEMOSTASIS)
ARMBAND PINK RESTRICT EXTREMIT (MISCELLANEOUS) ×3 IMPLANT
BNDG CMPR 9X4 STRL LF SNTH (GAUZE/BANDAGES/DRESSINGS) ×1
BNDG ESMARK 4X9 LF (GAUZE/BANDAGES/DRESSINGS) ×2 IMPLANT
CANISTER SUCTION 2500CC (MISCELLANEOUS) ×3 IMPLANT
CLIP TI MEDIUM 6 (CLIP) IMPLANT
CLIP TI WIDE RED SMALL 6 (CLIP) ×1 IMPLANT
COVER PROBE W GEL 5X96 (DRAPES) IMPLANT
CUFF TOURNIQUET SINGLE 18IN (TOURNIQUET CUFF) ×3 IMPLANT
DECANTER SPIKE VIAL GLASS SM (MISCELLANEOUS) ×3 IMPLANT
DERMABOND ADVANCED (GAUZE/BANDAGES/DRESSINGS) ×2
DERMABOND ADVANCED .7 DNX12 (GAUZE/BANDAGES/DRESSINGS) ×1 IMPLANT
DRAIN PENROSE 1/2X12 LTX STRL (WOUND CARE) IMPLANT
DRSG COVADERM 4X8 (GAUZE/BANDAGES/DRESSINGS) ×2 IMPLANT
ELECT REM PT RETURN 9FT ADLT (ELECTROSURGICAL) ×3
ELECTRODE REM PT RTRN 9FT ADLT (ELECTROSURGICAL) ×1 IMPLANT
GLOVE BIO SURGEON STRL SZ 6.5 (GLOVE) ×1 IMPLANT
GLOVE BIO SURGEON STRL SZ7 (GLOVE) ×3 IMPLANT
GLOVE BIO SURGEONS STRL SZ 6.5 (GLOVE) ×1
GLOVE BIOGEL PI IND STRL 6.5 (GLOVE) ×1 IMPLANT
GLOVE BIOGEL PI IND STRL 7.5 (GLOVE) ×1 IMPLANT
GLOVE BIOGEL PI INDICATOR 6.5 (GLOVE) ×2
GLOVE BIOGEL PI INDICATOR 7.5 (GLOVE) ×4
GLOVE SURG SS PI 6.5 STRL IVOR (GLOVE) ×2 IMPLANT
GOWN STRL REUS W/ TWL LRG LVL3 (GOWN DISPOSABLE) ×3 IMPLANT
GOWN STRL REUS W/TWL LRG LVL3 (GOWN DISPOSABLE) ×9
HEMOSTAT SPONGE AVITENE ULTRA (HEMOSTASIS) IMPLANT
KIT BASIN OR (CUSTOM PROCEDURE TRAY) ×3 IMPLANT
KIT ROOM TURNOVER OR (KITS) ×3 IMPLANT
NS IRRIG 1000ML POUR BTL (IV SOLUTION) ×3 IMPLANT
PACK CV ACCESS (CUSTOM PROCEDURE TRAY) ×3 IMPLANT
PAD ARMBOARD 7.5X6 YLW CONV (MISCELLANEOUS) ×6 IMPLANT
STAPLER VISISTAT 35W (STAPLE) ×2 IMPLANT
SUT MNCRL AB 4-0 PS2 18 (SUTURE) ×3 IMPLANT
SUT PROLENE 5 0 C 1 24 (SUTURE) ×6 IMPLANT
SUT PROLENE 6 0 BV (SUTURE) ×3 IMPLANT
SUT PROLENE 7 0 BV 1 (SUTURE) ×1 IMPLANT
SUT VIC AB 3-0 SH 27 (SUTURE) ×3
SUT VIC AB 3-0 SH 27X BRD (SUTURE) ×1 IMPLANT
UNDERPAD 30X30 (UNDERPADS AND DIAPERS) ×3 IMPLANT
WATER STERILE IRR 1000ML POUR (IV SOLUTION) ×3 IMPLANT

## 2016-03-14 NOTE — Anesthesia Postprocedure Evaluation (Addendum)
Anesthesia Post Note  Patient: Jessica Clarke  Procedure(s) Performed: Procedure(s) (LRB): PLICATION OF ARTERIOVENOUS FISTULA (Left)  Patient location during evaluation: PACU Anesthesia Type: General Level of consciousness: sedated Pain management: pain level controlled Vital Signs Assessment: post-procedure vital signs reviewed and stable Respiratory status: spontaneous breathing and respiratory function stable Cardiovascular status: stable Anesthetic complications: no       Last Vitals:  Vitals:   03/14/16 1630 03/14/16 1633  BP: 111/62 133/75  Pulse: (!) 56 (!) 57  Resp: 20 14  Temp: 36.7 C     Last Pain:  Vitals:   03/14/16 1630  TempSrc:   PainSc: 3                  [name] DANIEL

## 2016-03-14 NOTE — Transfer of Care (Signed)
Immediate Anesthesia Transfer of Care Note  Patient: Jessica Clarke  Procedure(s) Performed: Procedure(s): PLICATION OF ARTERIOVENOUS FISTULA (Left)  Patient Location: PACU  Anesthesia Type:General  Level of Consciousness: awake, alert  and oriented  Airway & Oxygen Therapy: Patient Spontanous Breathing and Patient connected to nasal cannula oxygen  Post-op Assessment: Report given to RN, Post -op Vital signs reviewed and stable and Patient moving all extremities X 4  Post vital signs: Reviewed and stable  Last Vitals:  Vitals:   03/14/16 1113  BP: (!) 156/70  Pulse: 69  Resp: 18  Temp: 36.6 C    Last Pain:  Vitals:   03/14/16 1113  TempSrc: Oral      Patients Stated Pain Goal: 3 (83/72/90 2111)  Complications: No apparent anesthesia complications

## 2016-03-14 NOTE — Anesthesia Preprocedure Evaluation (Addendum)
Anesthesia Evaluation  Patient identified by MRN, date of birth, ID band Patient awake    Reviewed: Allergy & Precautions, NPO status , Patient's Chart, lab work & pertinent test results  History of Anesthesia Complications Negative for: history of anesthetic complications  Airway Mallampati: II  TM Distance: >3 FB Neck ROM: Full    Dental  (+) Edentulous Upper, Poor Dentition, Dental Advisory Given   Pulmonary Current Smoker,    Pulmonary exam normal        Cardiovascular hypertension, + Past MI  Normal cardiovascular exam  Study Conclusions  - Left ventricle: Systolic function was mildly to moderately reduced. The estimated ejection fraction was in the range of 40% to 45%. Hypokinesis of the mid-apicalanteroseptal, anterior, and apical myocardium. Due to tachycardia, there was fusion of early and atrial contributions to ventricular filling. - Mitral valve: There was mild regurgitation. - Pulmonary arteries: Systolic pressure was mildly increased. PA peak pressure: 45 mm Hg (S).   Neuro/Psych negative neurological ROS  negative psych ROS   GI/Hepatic negative GI ROS, Neg liver ROS,   Endo/Other    Renal/GU Renal disease     Musculoskeletal   Abdominal   Peds  Hematology   Anesthesia Other Findings   Reproductive/Obstetrics                           Anesthesia Physical Anesthesia Plan  ASA: III  Anesthesia Plan: General   Post-op Pain Management:    Induction:   Airway Management Planned: LMA  Additional Equipment:   Intra-op Plan:   Post-operative Plan:   Informed Consent: I have reviewed the patients History and Physical, chart, labs and discussed the procedure including the risks, benefits and alternatives for the proposed anesthesia with the patient or authorized representative who has indicated his/her understanding and acceptance.   Dental advisory  given  Plan Discussed with: CRNA and Anesthesiologist  Anesthesia Plan Comments:        Anesthesia Quick Evaluation

## 2016-03-14 NOTE — Op Note (Signed)
    OPERATIVE NOTE   PROCEDURE: 1. 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 Large sized pseudoaneurysm with attenuated overlying skin 2. Palpable thrill at end of the case  SPECIMEN(S):  none  INDICATIONS:   Jessica Clarke is a 66 y.o. 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  In order to salvage the fistula and decrease the bleeding complication risks, I recommended plication of the fistula.  Risk, benefits, and alternatives to access surgery were discussed.  The patient is aware the risks include but are not limited to: bleeding, infection, steal syndrome, nerve damage, ischemic monomelic neuropathy, failure to mature, need for additional procedures, thrombosis of the access, death and stroke.  The patient agrees to proceed forward with the procedure.   DESCRIPTION: After obtaining full informed written consent, the patient was brought back to the operating room and placed supine upon the operating table.  The patient received IV antibiotics prior to induction.  After obtaining adequate anesthesia, the patient was prepped and draped in the standard fashion for: left arm access procedure.  The patient was given 8000 units of Heparin intravenously, which was a therapeutic bolus.  After waiting 5 minutes, a sterile tourniquet was applied to the upper arm and inflated to 250 mm Hg after exsanguinating the arm with an Esmark bandage.    An elliptical incision was made around the skin overlying the pseudoaneurysmal segment in the fistula.  I dissected the skin away from the pseudoaneurysm with electrocautery.  Eventually, I was able to skeletonize the  pseudoaneurysm.  I sharply entered into the pseudoaneurysm to verify the position of the lumen.  The residual fistula wall was felt to be adequately strong to repair.  I sharply tailored the pseudoaneurysmal wall, resecting attenuated fistula wall.  I repaired the fistula with a running stitch of 5-0 Prolene, taking care to take large bites of the fistula wall.  Prior to completing this repair, I pass 5 mm dilator proximally and distally: no resistance was encountered.  The repair was completed in the usual fashion.    At this point, the patient was given 40 mg of Protamine intravenously to reverse the anticoagulation.  Avitene were applied to the incision.  Bleeding points were controlled with suture ligature and electrocautery.  After a few more round of thrombin and gelfoam, no further active bleeding was noted.  I reapproximated the subcutaneous tissue immediately above the fistula with a running stitch of 3-0 Vicryl.  The skin was reapproximated with staples.  The skin was cleaned, dried, and the skin reinforced with Dermabond.  The patient continued to have a palpable thrill in the fistula.   COMPLICATIONS: none  CONDITION: stable   Adele Barthel, MD, [facility] [place] Office: [phone] Pager: [phone]  03/14/2016, 3:08 PM

## 2016-03-14 NOTE — H&P (Addendum)
Brief History and Physical  History of Present Illness  Jessica Clarke is a 66 y.o. female who presents with chief complaint: bleeding from LUE AVF.  The patient presents today for revision of LUE AVF.  Dr. Oneida Alar had recommended revision of the LUE AVF but pt refused multiple times.  Past Medical History:  Diagnosis Date  . Abnormal Pap smear    ASCUS ?HGSIL  . Cervical dysplasia   . Chronic kidney disease   . Constipation   . Constipation   . Gout   . Hypertension   . Obesity   . Uterine polyp     Past Surgical History:  Procedure Laterality Date  . AV FISTULA PLACEMENT  01/18/2012   Procedure: ARTERIOVENOUS (AV) FISTULA CREATION;  Surgeon: Angelia Mould, MD;  Location: [facility];  Service: Vascular;  Laterality: Left;  . DILATION AND CURETTAGE OF UTERUS    . HYSTEROSCOPY    . LEEP    . LEFT HEART CATHETERIZATION WITH CORONARY ANGIOGRAM N/A 08/26/2013   Procedure: LEFT HEART CATHETERIZATION WITH CORONARY ANGIOGRAM;  Surgeon: Leonie Man, MD;  Location: [facility] CATH LAB;  Service: Cardiovascular;  Laterality: N/A;    Social History   Social History  . Marital status: Married    Spouse name: N/A  . Number of children: N/A  . Years of education: N/A   Occupational History  .  Unemployed    Unemployed    Social History Main Topics  . Smoking status: Current Every Day Smoker    Packs/day: 1.00    Years: 30.00    Types: Cigarettes  . Smokeless tobacco: Never Used  . Alcohol use 3.6 oz/week    6 Cans of beer per week  . Drug use: No  . Sexual activity: No     Comment: Did not ask   Other Topics Concern  . Not on file   Social History Narrative  . No narrative on file    Family History  Problem Relation Age of Onset  . Esophageal cancer    . Pancreatic cancer    . Heart disease Mother   . Hypertension Mother   . Breast cancer Sister   . Esophageal cancer Sister   . Cancer Sister   . Deep vein thrombosis Sister   . Diabetes Sister   .  Hypertension Sister   . Hypertension Daughter   . Colon cancer Neg Hx     No current facility-administered medications on file prior to encounter.    Current Outpatient Prescriptions on File Prior to Encounter  Medication Sig Dispense Refill  . allopurinol (ZYLOPRIM) 100 MG tablet Take 100 mg by mouth daily.      Marland Kitchen aspirin EC 325 MG tablet Take 1 tablet (325 mg total) by mouth daily. 30 tablet 0  . atenolol (TENORMIN) 100 MG tablet Take 1 tablet (100 mg total) by mouth daily. 60 tablet 0  . atorvastatin (LIPITOR) 10 MG tablet Take 1 tablet (10 mg total) by mouth daily. Can use any approved statin same dose 30 tablet 0  . Multiple Vitamin (MULTIVITAMIN WITH MINERALS) TABS tablet Take 1 tablet by mouth daily. 30 tablet 0  . sodium bicarbonate 650 MG tablet Take 650 mg by mouth 3 (three) times daily.    Marland Kitchen thiamine 100 MG tablet Take 1 tablet (100 mg total) by mouth daily. 30 tablet 0    Allergies  Allergen Reactions  . No Known Allergies     Review of Systems: As listed  above, otherwise negative.  Physical Examination  There were no vitals filed for this visit.  General: A&O x 3, WDWN  Pulmonary: Sym exp, good air movt, CTAB, no rales, rhonchi, & wheezing  Cardiac: RRR, Nl S1, S2, no Murmurs, rubs or gallops  Gastrointestinal: soft, NTND, -G/R, - HSM, - masses, - CVAT B  Musculoskeletal: M/S 5/5 throughout, Extremities without ischemic changes, mid-segment pseudoaneurysm with some evidence of superficial bleeding  Laboratory See iStat  Medical Decision Making  Jessica Clarke is a 66 y.o. female who presents with: LUE AVF PSA at risk for bleeding   The patient is scheduled for: revision of LUE AVF (plication of PSA)  Risk, benefits, and alternatives to access surgery were discussed.  The patient is aware the risks include but are not limited to: bleeding, infection, steal syndrome, nerve damage, ischemic monomelic neuropathy, failure to mature, and need for additional  procedures.  I discussed with the patient that she might need TDC placement in the future if her HD center could not cannulate the residual fistula.  The patient is aware of the risks and agrees to proceed.  Adele Barthel, MD [facility] [place] Office: [phone] Pager: [phone]  03/14/2016, 10:44 AM

## 2016-03-14 NOTE — Anesthesia Procedure Notes (Signed)
Procedure Name: LMA Insertion Date/Time: 03/14/2016 2:01 PM Performed by: Mariea Clonts Pre-anesthesia Checklist: Patient identified, Emergency Drugs available, Suction available and Patient being monitored Patient Re-evaluated:Patient Re-evaluated prior to inductionOxygen Delivery Method: Circle System Utilized Preoxygenation: Pre-oxygenation with 100% oxygen Intubation Type: IV induction Ventilation: Mask ventilation without difficulty LMA: LMA inserted LMA Size: 4.0 Number of attempts: 1 Airway Equipment and Method: Bite block Placement Confirmation: positive ETCO2 Tube secured with: Tape Dental Injury: Teeth and Oropharynx as per pre-operative assessment

## 2016-03-14 NOTE — Progress Notes (Signed)
Pt states she has not seen cardiologist since 2015 after MI.  EGK done today shown to Dr. Tobias Alexander.  Redraw ISTAT to verify potassium--possibly hemolyzed sample.   Second I-STAT completed with better results.

## 2016-03-15 ENCOUNTER — Encounter (HOSPITAL_COMMUNITY): Payer: Self-pay | Admitting: Vascular Surgery

## 2016-03-15 DIAGNOSIS — N2889 Other specified disorders of kidney and ureter: Secondary | ICD-10-CM | POA: Diagnosis not present

## 2016-03-15 DIAGNOSIS — N186 End stage renal disease: Secondary | ICD-10-CM | POA: Diagnosis not present

## 2016-03-15 DIAGNOSIS — Z992 Dependence on renal dialysis: Secondary | ICD-10-CM | POA: Diagnosis not present

## 2016-03-15 LAB — POCT I-STAT 4, (NA,K, GLUC, HGB,HCT)
Glucose, Bld: 84 mg/dL (ref 65–99)
HCT: 40 % (ref 36.0–46.0)
Hemoglobin: 13.6 g/dL (ref 12.0–15.0)
Potassium: >8.5 mmol/L (ref 3.5–5.1)
Sodium: 128 mmol/L — ABNORMAL LOW (ref 135–145)

## 2016-03-17 ENCOUNTER — Encounter (HOSPITAL_COMMUNITY): Payer: Self-pay | Admitting: Vascular Surgery

## 2016-03-17 DIAGNOSIS — N2581 Secondary hyperparathyroidism of renal origin: Secondary | ICD-10-CM | POA: Diagnosis not present

## 2016-03-17 DIAGNOSIS — N186 End stage renal disease: Secondary | ICD-10-CM | POA: Diagnosis not present

## 2016-03-17 DIAGNOSIS — D631 Anemia in chronic kidney disease: Secondary | ICD-10-CM | POA: Diagnosis not present

## 2016-03-17 NOTE — Addendum Note (Signed)
Addendum  created 03/17/16 1855 by Duane Boston, MD   SmartForm saved

## 2016-03-20 DIAGNOSIS — N2581 Secondary hyperparathyroidism of renal origin: Secondary | ICD-10-CM | POA: Diagnosis not present

## 2016-03-20 DIAGNOSIS — N186 End stage renal disease: Secondary | ICD-10-CM | POA: Diagnosis not present

## 2016-03-20 DIAGNOSIS — D631 Anemia in chronic kidney disease: Secondary | ICD-10-CM | POA: Diagnosis not present

## 2016-03-21 ENCOUNTER — Encounter: Payer: Self-pay | Admitting: Vascular Surgery

## 2016-03-22 DIAGNOSIS — N2581 Secondary hyperparathyroidism of renal origin: Secondary | ICD-10-CM | POA: Diagnosis not present

## 2016-03-22 DIAGNOSIS — D631 Anemia in chronic kidney disease: Secondary | ICD-10-CM | POA: Diagnosis not present

## 2016-03-22 DIAGNOSIS — N186 End stage renal disease: Secondary | ICD-10-CM | POA: Diagnosis not present

## 2016-03-24 DIAGNOSIS — N186 End stage renal disease: Secondary | ICD-10-CM | POA: Diagnosis not present

## 2016-03-24 DIAGNOSIS — D631 Anemia in chronic kidney disease: Secondary | ICD-10-CM | POA: Diagnosis not present

## 2016-03-24 DIAGNOSIS — N2581 Secondary hyperparathyroidism of renal origin: Secondary | ICD-10-CM | POA: Diagnosis not present

## 2016-03-27 DIAGNOSIS — N186 End stage renal disease: Secondary | ICD-10-CM | POA: Diagnosis not present

## 2016-03-27 DIAGNOSIS — N2581 Secondary hyperparathyroidism of renal origin: Secondary | ICD-10-CM | POA: Diagnosis not present

## 2016-03-27 DIAGNOSIS — D631 Anemia in chronic kidney disease: Secondary | ICD-10-CM | POA: Diagnosis not present

## 2016-03-28 ENCOUNTER — Ambulatory Visit (INDEPENDENT_AMBULATORY_CARE_PROVIDER_SITE_OTHER): Payer: Self-pay | Admitting: Vascular Surgery

## 2016-03-28 VITALS — BP 190/94 | HR 84 | Temp 97.6°F | Ht 71.0 in | Wt 176.0 lb

## 2016-03-28 DIAGNOSIS — Z992 Dependence on renal dialysis: Secondary | ICD-10-CM

## 2016-03-28 DIAGNOSIS — N186 End stage renal disease: Secondary | ICD-10-CM

## 2016-03-28 NOTE — Progress Notes (Signed)
   Patient name: Adalene Gulotta MRN: [id number] DOB: [birth year] Sex: female  REASON FOR VISIT: post-op staple removal  HPI: Shamell Hittle is a 66 y.o. female who presents for post-operative follow-up status post plication of left upper arm fistula on 03/14/16 by Dr. Bridgett Larsson. This was done secondary to a pseudoaneurysm with overlying ulceration. The patient has been having dialysis through cannulation above and below the incision. She denies any pain or numbness with her left hand. She does report one episode of infiltration above her incision. She is complaining of hardening in that area.  Current Outpatient Prescriptions  Medication Sig Dispense Refill  . allopurinol (ZYLOPRIM) 100 MG tablet Take 100 mg by mouth daily.      Marland Kitchen aspirin EC 325 MG tablet Take 1 tablet (325 mg total) by mouth daily. 30 tablet 0  . atenolol (TENORMIN) 100 MG tablet Take 1 tablet (100 mg total) by mouth daily. 60 tablet 0  . atorvastatin (LIPITOR) 10 MG tablet   0  . Multiple Vitamin (MULTIVITAMIN WITH MINERALS) TABS tablet Take 1 tablet by mouth daily. 30 tablet 0  . oxyCODONE-acetaminophen (PERCOCET/ROXICET) 5-325 MG tablet Take 1 tablet by mouth every 6 (six) hours as needed. 10 tablet 0   No current facility-administered medications for this visit.     REVIEW OF SYSTEMS:  '[X]'$  denotes positive finding, '[ ]'$  denotes negative finding Cardiac  Comments:  Chest pain or chest pressure:    Shortness of breath upon exertion:    Short of breath when lying flat:    Irregular heart rhythm:    Constitutional    Fever or chills:      PHYSICAL EXAM: Vitals:   03/28/16 1333  BP: (!) 190/94  Pulse: 84  Temp: 97.6 F (36.4 C)  TempSrc: Oral  Weight: 176 lb (79.8 kg)  Height: '5\' 11"'$  (1.803 m)    GENERAL: The patient is a well-nourished female, in no acute distress. The vital signs are documented above. VASCULAR: Left upper arm fistula with palpable thrill throughout. There is some ecchymosis and a small hematoma at  the proximal upper arm. The fistula is palpable in this area however. Palpable left radial pulse. Incision and motor intact left hand.  MEDICAL ISSUES: Status post plication left upper arm AV fistula  The patient's staples were removed today. Advised the patient to avoid cannulation under her incision for an additional 2 weeks. She does have a hematoma at the proximal upper arm, but the fistula can still be easily palpated. Discussed that if she has issues with dialysis to contact our office. She will follow up in 2 weeks with Dr. Bridgett Larsson to evaluate her fistula.  Virgina Jock, PA-C [facility] MD: [place]

## 2016-03-29 DIAGNOSIS — N186 End stage renal disease: Secondary | ICD-10-CM | POA: Diagnosis not present

## 2016-03-29 DIAGNOSIS — N2581 Secondary hyperparathyroidism of renal origin: Secondary | ICD-10-CM | POA: Diagnosis not present

## 2016-03-29 DIAGNOSIS — D631 Anemia in chronic kidney disease: Secondary | ICD-10-CM | POA: Diagnosis not present

## 2016-03-31 DIAGNOSIS — D631 Anemia in chronic kidney disease: Secondary | ICD-10-CM | POA: Diagnosis not present

## 2016-03-31 DIAGNOSIS — N186 End stage renal disease: Secondary | ICD-10-CM | POA: Diagnosis not present

## 2016-03-31 DIAGNOSIS — N2581 Secondary hyperparathyroidism of renal origin: Secondary | ICD-10-CM | POA: Diagnosis not present

## 2016-04-03 DIAGNOSIS — D631 Anemia in chronic kidney disease: Secondary | ICD-10-CM | POA: Diagnosis not present

## 2016-04-03 DIAGNOSIS — N186 End stage renal disease: Secondary | ICD-10-CM | POA: Diagnosis not present

## 2016-04-03 DIAGNOSIS — N2581 Secondary hyperparathyroidism of renal origin: Secondary | ICD-10-CM | POA: Diagnosis not present

## 2016-04-05 DIAGNOSIS — N186 End stage renal disease: Secondary | ICD-10-CM | POA: Diagnosis not present

## 2016-04-05 DIAGNOSIS — D631 Anemia in chronic kidney disease: Secondary | ICD-10-CM | POA: Diagnosis not present

## 2016-04-05 DIAGNOSIS — N2581 Secondary hyperparathyroidism of renal origin: Secondary | ICD-10-CM | POA: Diagnosis not present

## 2016-04-07 ENCOUNTER — Encounter: Payer: Self-pay | Admitting: Vascular Surgery

## 2016-04-07 DIAGNOSIS — D631 Anemia in chronic kidney disease: Secondary | ICD-10-CM | POA: Diagnosis not present

## 2016-04-07 DIAGNOSIS — N186 End stage renal disease: Secondary | ICD-10-CM | POA: Diagnosis not present

## 2016-04-07 DIAGNOSIS — N2581 Secondary hyperparathyroidism of renal origin: Secondary | ICD-10-CM | POA: Diagnosis not present

## 2016-04-10 DIAGNOSIS — N186 End stage renal disease: Secondary | ICD-10-CM | POA: Diagnosis not present

## 2016-04-10 DIAGNOSIS — D631 Anemia in chronic kidney disease: Secondary | ICD-10-CM | POA: Diagnosis not present

## 2016-04-10 DIAGNOSIS — N2581 Secondary hyperparathyroidism of renal origin: Secondary | ICD-10-CM | POA: Diagnosis not present

## 2016-04-12 ENCOUNTER — Telehealth: Payer: Self-pay | Admitting: Vascular Surgery

## 2016-04-12 DIAGNOSIS — N2581 Secondary hyperparathyroidism of renal origin: Secondary | ICD-10-CM | POA: Diagnosis not present

## 2016-04-12 DIAGNOSIS — D631 Anemia in chronic kidney disease: Secondary | ICD-10-CM | POA: Diagnosis not present

## 2016-04-12 DIAGNOSIS — N186 End stage renal disease: Secondary | ICD-10-CM | POA: Diagnosis not present

## 2016-04-12 DIAGNOSIS — Z992 Dependence on renal dialysis: Secondary | ICD-10-CM | POA: Diagnosis not present

## 2016-04-12 DIAGNOSIS — N2889 Other specified disorders of kidney and ureter: Secondary | ICD-10-CM | POA: Diagnosis not present

## 2016-04-12 NOTE — Telephone Encounter (Signed)
-----   Message from Conrad [place], MD sent at 04/12/2016  9:58 AM EST ----- Regarding: RE: Jessica Clarke to see PA? Should be fine  ----- Message ----- From: Donzetta Matters Sent: 04/12/2016   9:14 AM To: Conrad [place], MD Subject: Jessica Clarke to see PA?                                  Hello Dr. Bridgett Larsson,  This patient is calling to see if we can reschedule her post op appointment from a Friday to a Thursday. I told her that you are mostly available on fridays but she cannot come in. Would this patient be suitable to see the NP or the PA on a Thursday?

## 2016-04-12 NOTE — Telephone Encounter (Signed)
Left message to make patient aware that her appointment has been moved from 3/2 to 3/1 @ 2pm to see the PA per her request.

## 2016-04-13 ENCOUNTER — Ambulatory Visit (INDEPENDENT_AMBULATORY_CARE_PROVIDER_SITE_OTHER): Payer: Self-pay | Admitting: Vascular Surgery

## 2016-04-13 ENCOUNTER — Encounter: Payer: Self-pay | Admitting: Vascular Surgery

## 2016-04-13 VITALS — BP 164/86 | HR 66 | Temp 97.1°F | Resp 16 | Ht 71.0 in | Wt 179.0 lb

## 2016-04-13 DIAGNOSIS — Z992 Dependence on renal dialysis: Secondary | ICD-10-CM

## 2016-04-13 DIAGNOSIS — N186 End stage renal disease: Secondary | ICD-10-CM

## 2016-04-13 NOTE — Progress Notes (Signed)
   Patient name: Tasha Diaz MRN: [id number] DOB: 10-11-1950 Sex: female  REASON FOR VISIT: post-op  HPI: Iliyah Bui is a 66 y.o. female who presents for follow-up status post plication of left upper arm fistula on 03/14/16 with Dr. Bridgett Larsson. She had staple removal two weeks ago. At that time, she complained of an episode of infiltration to her proximal fistula. She denies any issues with dialysis. She denies any pain or paresthesias of her left hand.   Current Outpatient Prescriptions  Medication Sig Dispense Refill  . allopurinol (ZYLOPRIM) 100 MG tablet Take 100 mg by mouth daily.      Marland Kitchen aspirin EC 325 MG tablet Take 1 tablet (325 mg total) by mouth daily. 30 tablet 0  . atenolol (TENORMIN) 100 MG tablet Take 1 tablet (100 mg total) by mouth daily. 60 tablet 0  . atorvastatin (LIPITOR) 10 MG tablet   0  . Multiple Vitamin (MULTIVITAMIN WITH MINERALS) TABS tablet Take 1 tablet by mouth daily. 30 tablet 0  . oxyCODONE-acetaminophen (PERCOCET/ROXICET) 5-325 MG tablet Take 1 tablet by mouth every 6 (six) hours as needed. (Patient not taking: Reported on 04/13/2016) 10 tablet 0   No current facility-administered medications for this visit.     REVIEW OF SYSTEMS:  '[X]'$  denotes positive finding, '[ ]'$  denotes negative finding Cardiac  Comments:  Chest pain or chest pressure:    Shortness of breath upon exertion:    Short of breath when lying flat:    Irregular heart rhythm:    Constitutional    Fever or chills:      PHYSICAL EXAM: Vitals:   04/13/16 1410 04/13/16 1412  BP: (!) 165/89 (!) 164/86  Pulse: 65 66  Resp: 16   Temp: 97.1 F (36.2 C)   SpO2: 100%   Weight: 179 lb (81.2 kg)   Height: '5\' 11"'$  (1.803 m)     GENERAL: The patient is a well-nourished female, in no acute distress. The vital signs are documented above. VASCULAR: Left upper arm fistula with palpable thrill. Incision is well healed. Ecchymosis and hematoma resolved from prior office visit 2 weeks ago.    MEDICAL  ISSUES: S/p plication left upper arm fistula  The patient's left arm incision is well healed. She is now 4 weeks post-op. Ok to cannulate fistula under incision. She will follow-up on as needed basis. Will route chart to Dr. Joelyn Oms.   Virgina Jock, PA-C [facility] MD: Oneida Alar

## 2016-04-13 NOTE — Progress Notes (Signed)
Vitals:   04/13/16 1410  BP: (!) 165/89  Pulse: 65  Resp: 16  Temp: 97.1 F (36.2 C)  SpO2: 100%  Weight: 179 lb (81.2 kg)  Height: '5\' 11"'$  (1.803 m)

## 2016-04-14 ENCOUNTER — Encounter: Payer: Medicare Other | Admitting: Vascular Surgery

## 2016-04-14 DIAGNOSIS — D631 Anemia in chronic kidney disease: Secondary | ICD-10-CM | POA: Diagnosis not present

## 2016-04-14 DIAGNOSIS — N2581 Secondary hyperparathyroidism of renal origin: Secondary | ICD-10-CM | POA: Diagnosis not present

## 2016-04-14 DIAGNOSIS — N186 End stage renal disease: Secondary | ICD-10-CM | POA: Diagnosis not present

## 2016-04-17 DIAGNOSIS — N186 End stage renal disease: Secondary | ICD-10-CM | POA: Diagnosis not present

## 2016-04-17 DIAGNOSIS — N2581 Secondary hyperparathyroidism of renal origin: Secondary | ICD-10-CM | POA: Diagnosis not present

## 2016-04-17 DIAGNOSIS — D631 Anemia in chronic kidney disease: Secondary | ICD-10-CM | POA: Diagnosis not present

## 2016-04-19 DIAGNOSIS — N2581 Secondary hyperparathyroidism of renal origin: Secondary | ICD-10-CM | POA: Diagnosis not present

## 2016-04-19 DIAGNOSIS — N186 End stage renal disease: Secondary | ICD-10-CM | POA: Diagnosis not present

## 2016-04-19 DIAGNOSIS — D631 Anemia in chronic kidney disease: Secondary | ICD-10-CM | POA: Diagnosis not present

## 2016-04-21 DIAGNOSIS — N186 End stage renal disease: Secondary | ICD-10-CM | POA: Diagnosis not present

## 2016-04-21 DIAGNOSIS — D631 Anemia in chronic kidney disease: Secondary | ICD-10-CM | POA: Diagnosis not present

## 2016-04-21 DIAGNOSIS — N2581 Secondary hyperparathyroidism of renal origin: Secondary | ICD-10-CM | POA: Diagnosis not present

## 2016-04-26 DIAGNOSIS — N186 End stage renal disease: Secondary | ICD-10-CM | POA: Diagnosis not present

## 2016-04-26 DIAGNOSIS — N2581 Secondary hyperparathyroidism of renal origin: Secondary | ICD-10-CM | POA: Diagnosis not present

## 2016-04-26 DIAGNOSIS — D631 Anemia in chronic kidney disease: Secondary | ICD-10-CM | POA: Diagnosis not present

## 2016-04-28 DIAGNOSIS — D631 Anemia in chronic kidney disease: Secondary | ICD-10-CM | POA: Diagnosis not present

## 2016-04-28 DIAGNOSIS — N2581 Secondary hyperparathyroidism of renal origin: Secondary | ICD-10-CM | POA: Diagnosis not present

## 2016-04-28 DIAGNOSIS — N186 End stage renal disease: Secondary | ICD-10-CM | POA: Diagnosis not present

## 2016-05-01 DIAGNOSIS — D631 Anemia in chronic kidney disease: Secondary | ICD-10-CM | POA: Diagnosis not present

## 2016-05-01 DIAGNOSIS — N2581 Secondary hyperparathyroidism of renal origin: Secondary | ICD-10-CM | POA: Diagnosis not present

## 2016-05-01 DIAGNOSIS — N186 End stage renal disease: Secondary | ICD-10-CM | POA: Diagnosis not present

## 2016-05-03 DIAGNOSIS — N186 End stage renal disease: Secondary | ICD-10-CM | POA: Diagnosis not present

## 2016-05-03 DIAGNOSIS — N2581 Secondary hyperparathyroidism of renal origin: Secondary | ICD-10-CM | POA: Diagnosis not present

## 2016-05-03 DIAGNOSIS — D631 Anemia in chronic kidney disease: Secondary | ICD-10-CM | POA: Diagnosis not present

## 2016-05-05 DIAGNOSIS — D631 Anemia in chronic kidney disease: Secondary | ICD-10-CM | POA: Diagnosis not present

## 2016-05-05 DIAGNOSIS — N186 End stage renal disease: Secondary | ICD-10-CM | POA: Diagnosis not present

## 2016-05-05 DIAGNOSIS — N2581 Secondary hyperparathyroidism of renal origin: Secondary | ICD-10-CM | POA: Diagnosis not present

## 2016-05-08 DIAGNOSIS — N2581 Secondary hyperparathyroidism of renal origin: Secondary | ICD-10-CM | POA: Diagnosis not present

## 2016-05-08 DIAGNOSIS — D631 Anemia in chronic kidney disease: Secondary | ICD-10-CM | POA: Diagnosis not present

## 2016-05-08 DIAGNOSIS — N186 End stage renal disease: Secondary | ICD-10-CM | POA: Diagnosis not present

## 2016-05-10 DIAGNOSIS — N2581 Secondary hyperparathyroidism of renal origin: Secondary | ICD-10-CM | POA: Diagnosis not present

## 2016-05-10 DIAGNOSIS — N186 End stage renal disease: Secondary | ICD-10-CM | POA: Diagnosis not present

## 2016-05-10 DIAGNOSIS — D631 Anemia in chronic kidney disease: Secondary | ICD-10-CM | POA: Diagnosis not present

## 2016-05-12 DIAGNOSIS — N2581 Secondary hyperparathyroidism of renal origin: Secondary | ICD-10-CM | POA: Diagnosis not present

## 2016-05-12 DIAGNOSIS — N186 End stage renal disease: Secondary | ICD-10-CM | POA: Diagnosis not present

## 2016-05-12 DIAGNOSIS — D631 Anemia in chronic kidney disease: Secondary | ICD-10-CM | POA: Diagnosis not present

## 2016-05-13 DIAGNOSIS — Z992 Dependence on renal dialysis: Secondary | ICD-10-CM | POA: Diagnosis not present

## 2016-05-13 DIAGNOSIS — N2889 Other specified disorders of kidney and ureter: Secondary | ICD-10-CM | POA: Diagnosis not present

## 2016-05-13 DIAGNOSIS — N186 End stage renal disease: Secondary | ICD-10-CM | POA: Diagnosis not present

## 2016-05-15 DIAGNOSIS — D631 Anemia in chronic kidney disease: Secondary | ICD-10-CM | POA: Diagnosis not present

## 2016-05-15 DIAGNOSIS — N186 End stage renal disease: Secondary | ICD-10-CM | POA: Diagnosis not present

## 2016-05-15 DIAGNOSIS — N2581 Secondary hyperparathyroidism of renal origin: Secondary | ICD-10-CM | POA: Diagnosis not present

## 2016-05-17 DIAGNOSIS — N186 End stage renal disease: Secondary | ICD-10-CM | POA: Diagnosis not present

## 2016-05-17 DIAGNOSIS — D631 Anemia in chronic kidney disease: Secondary | ICD-10-CM | POA: Diagnosis not present

## 2016-05-17 DIAGNOSIS — N2581 Secondary hyperparathyroidism of renal origin: Secondary | ICD-10-CM | POA: Diagnosis not present

## 2016-05-19 DIAGNOSIS — N2581 Secondary hyperparathyroidism of renal origin: Secondary | ICD-10-CM | POA: Diagnosis not present

## 2016-05-19 DIAGNOSIS — D631 Anemia in chronic kidney disease: Secondary | ICD-10-CM | POA: Diagnosis not present

## 2016-05-19 DIAGNOSIS — N186 End stage renal disease: Secondary | ICD-10-CM | POA: Diagnosis not present

## 2016-05-22 DIAGNOSIS — N2581 Secondary hyperparathyroidism of renal origin: Secondary | ICD-10-CM | POA: Diagnosis not present

## 2016-05-22 DIAGNOSIS — N186 End stage renal disease: Secondary | ICD-10-CM | POA: Diagnosis not present

## 2016-05-22 DIAGNOSIS — D631 Anemia in chronic kidney disease: Secondary | ICD-10-CM | POA: Diagnosis not present

## 2016-05-24 DIAGNOSIS — N186 End stage renal disease: Secondary | ICD-10-CM | POA: Diagnosis not present

## 2016-05-24 DIAGNOSIS — N2581 Secondary hyperparathyroidism of renal origin: Secondary | ICD-10-CM | POA: Diagnosis not present

## 2016-05-24 DIAGNOSIS — D631 Anemia in chronic kidney disease: Secondary | ICD-10-CM | POA: Diagnosis not present

## 2016-05-26 DIAGNOSIS — N186 End stage renal disease: Secondary | ICD-10-CM | POA: Diagnosis not present

## 2016-05-26 DIAGNOSIS — D631 Anemia in chronic kidney disease: Secondary | ICD-10-CM | POA: Diagnosis not present

## 2016-05-26 DIAGNOSIS — N2581 Secondary hyperparathyroidism of renal origin: Secondary | ICD-10-CM | POA: Diagnosis not present

## 2016-05-31 DIAGNOSIS — N186 End stage renal disease: Secondary | ICD-10-CM | POA: Diagnosis not present

## 2016-05-31 DIAGNOSIS — N2581 Secondary hyperparathyroidism of renal origin: Secondary | ICD-10-CM | POA: Diagnosis not present

## 2016-05-31 DIAGNOSIS — D631 Anemia in chronic kidney disease: Secondary | ICD-10-CM | POA: Diagnosis not present

## 2016-06-02 DIAGNOSIS — N2581 Secondary hyperparathyroidism of renal origin: Secondary | ICD-10-CM | POA: Diagnosis not present

## 2016-06-02 DIAGNOSIS — N186 End stage renal disease: Secondary | ICD-10-CM | POA: Diagnosis not present

## 2016-06-02 DIAGNOSIS — D631 Anemia in chronic kidney disease: Secondary | ICD-10-CM | POA: Diagnosis not present

## 2016-06-05 DIAGNOSIS — N186 End stage renal disease: Secondary | ICD-10-CM | POA: Diagnosis not present

## 2016-06-05 DIAGNOSIS — D631 Anemia in chronic kidney disease: Secondary | ICD-10-CM | POA: Diagnosis not present

## 2016-06-05 DIAGNOSIS — N2581 Secondary hyperparathyroidism of renal origin: Secondary | ICD-10-CM | POA: Diagnosis not present

## 2016-06-07 DIAGNOSIS — N186 End stage renal disease: Secondary | ICD-10-CM | POA: Diagnosis not present

## 2016-06-07 DIAGNOSIS — D631 Anemia in chronic kidney disease: Secondary | ICD-10-CM | POA: Diagnosis not present

## 2016-06-07 DIAGNOSIS — N2581 Secondary hyperparathyroidism of renal origin: Secondary | ICD-10-CM | POA: Diagnosis not present

## 2016-06-09 DIAGNOSIS — N186 End stage renal disease: Secondary | ICD-10-CM | POA: Diagnosis not present

## 2016-06-09 DIAGNOSIS — N2581 Secondary hyperparathyroidism of renal origin: Secondary | ICD-10-CM | POA: Diagnosis not present

## 2016-06-09 DIAGNOSIS — D631 Anemia in chronic kidney disease: Secondary | ICD-10-CM | POA: Diagnosis not present

## 2016-06-12 DIAGNOSIS — D631 Anemia in chronic kidney disease: Secondary | ICD-10-CM | POA: Diagnosis not present

## 2016-06-12 DIAGNOSIS — N2581 Secondary hyperparathyroidism of renal origin: Secondary | ICD-10-CM | POA: Diagnosis not present

## 2016-06-12 DIAGNOSIS — Z992 Dependence on renal dialysis: Secondary | ICD-10-CM | POA: Diagnosis not present

## 2016-06-12 DIAGNOSIS — N186 End stage renal disease: Secondary | ICD-10-CM | POA: Diagnosis not present

## 2016-06-12 DIAGNOSIS — N2889 Other specified disorders of kidney and ureter: Secondary | ICD-10-CM | POA: Diagnosis not present

## 2016-06-14 DIAGNOSIS — D631 Anemia in chronic kidney disease: Secondary | ICD-10-CM | POA: Diagnosis not present

## 2016-06-14 DIAGNOSIS — N186 End stage renal disease: Secondary | ICD-10-CM | POA: Diagnosis not present

## 2016-06-14 DIAGNOSIS — D509 Iron deficiency anemia, unspecified: Secondary | ICD-10-CM | POA: Diagnosis not present

## 2016-06-14 DIAGNOSIS — N2581 Secondary hyperparathyroidism of renal origin: Secondary | ICD-10-CM | POA: Diagnosis not present

## 2016-06-16 DIAGNOSIS — N186 End stage renal disease: Secondary | ICD-10-CM | POA: Diagnosis not present

## 2016-06-16 DIAGNOSIS — D631 Anemia in chronic kidney disease: Secondary | ICD-10-CM | POA: Diagnosis not present

## 2016-06-16 DIAGNOSIS — N2581 Secondary hyperparathyroidism of renal origin: Secondary | ICD-10-CM | POA: Diagnosis not present

## 2016-06-16 DIAGNOSIS — D509 Iron deficiency anemia, unspecified: Secondary | ICD-10-CM | POA: Diagnosis not present

## 2016-06-19 DIAGNOSIS — D631 Anemia in chronic kidney disease: Secondary | ICD-10-CM | POA: Diagnosis not present

## 2016-06-19 DIAGNOSIS — N2581 Secondary hyperparathyroidism of renal origin: Secondary | ICD-10-CM | POA: Diagnosis not present

## 2016-06-19 DIAGNOSIS — N186 End stage renal disease: Secondary | ICD-10-CM | POA: Diagnosis not present

## 2016-06-19 DIAGNOSIS — D509 Iron deficiency anemia, unspecified: Secondary | ICD-10-CM | POA: Diagnosis not present

## 2016-06-21 DIAGNOSIS — D631 Anemia in chronic kidney disease: Secondary | ICD-10-CM | POA: Diagnosis not present

## 2016-06-21 DIAGNOSIS — N186 End stage renal disease: Secondary | ICD-10-CM | POA: Diagnosis not present

## 2016-06-21 DIAGNOSIS — N2581 Secondary hyperparathyroidism of renal origin: Secondary | ICD-10-CM | POA: Diagnosis not present

## 2016-06-21 DIAGNOSIS — D509 Iron deficiency anemia, unspecified: Secondary | ICD-10-CM | POA: Diagnosis not present

## 2016-06-23 DIAGNOSIS — N2581 Secondary hyperparathyroidism of renal origin: Secondary | ICD-10-CM | POA: Diagnosis not present

## 2016-06-23 DIAGNOSIS — D509 Iron deficiency anemia, unspecified: Secondary | ICD-10-CM | POA: Diagnosis not present

## 2016-06-23 DIAGNOSIS — N186 End stage renal disease: Secondary | ICD-10-CM | POA: Diagnosis not present

## 2016-06-23 DIAGNOSIS — D631 Anemia in chronic kidney disease: Secondary | ICD-10-CM | POA: Diagnosis not present

## 2016-06-26 DIAGNOSIS — N186 End stage renal disease: Secondary | ICD-10-CM | POA: Diagnosis not present

## 2016-06-26 DIAGNOSIS — N2581 Secondary hyperparathyroidism of renal origin: Secondary | ICD-10-CM | POA: Diagnosis not present

## 2016-06-26 DIAGNOSIS — D509 Iron deficiency anemia, unspecified: Secondary | ICD-10-CM | POA: Diagnosis not present

## 2016-06-26 DIAGNOSIS — D631 Anemia in chronic kidney disease: Secondary | ICD-10-CM | POA: Diagnosis not present

## 2016-06-28 DIAGNOSIS — N2581 Secondary hyperparathyroidism of renal origin: Secondary | ICD-10-CM | POA: Diagnosis not present

## 2016-06-28 DIAGNOSIS — D509 Iron deficiency anemia, unspecified: Secondary | ICD-10-CM | POA: Diagnosis not present

## 2016-06-28 DIAGNOSIS — D631 Anemia in chronic kidney disease: Secondary | ICD-10-CM | POA: Diagnosis not present

## 2016-06-28 DIAGNOSIS — N186 End stage renal disease: Secondary | ICD-10-CM | POA: Diagnosis not present

## 2016-06-30 DIAGNOSIS — D631 Anemia in chronic kidney disease: Secondary | ICD-10-CM | POA: Diagnosis not present

## 2016-06-30 DIAGNOSIS — N2581 Secondary hyperparathyroidism of renal origin: Secondary | ICD-10-CM | POA: Diagnosis not present

## 2016-06-30 DIAGNOSIS — N186 End stage renal disease: Secondary | ICD-10-CM | POA: Diagnosis not present

## 2016-06-30 DIAGNOSIS — D509 Iron deficiency anemia, unspecified: Secondary | ICD-10-CM | POA: Diagnosis not present

## 2016-07-05 DIAGNOSIS — D631 Anemia in chronic kidney disease: Secondary | ICD-10-CM | POA: Diagnosis not present

## 2016-07-05 DIAGNOSIS — N186 End stage renal disease: Secondary | ICD-10-CM | POA: Diagnosis not present

## 2016-07-05 DIAGNOSIS — D509 Iron deficiency anemia, unspecified: Secondary | ICD-10-CM | POA: Diagnosis not present

## 2016-07-05 DIAGNOSIS — N2581 Secondary hyperparathyroidism of renal origin: Secondary | ICD-10-CM | POA: Diagnosis not present

## 2016-07-07 DIAGNOSIS — D631 Anemia in chronic kidney disease: Secondary | ICD-10-CM | POA: Diagnosis not present

## 2016-07-07 DIAGNOSIS — N2581 Secondary hyperparathyroidism of renal origin: Secondary | ICD-10-CM | POA: Diagnosis not present

## 2016-07-07 DIAGNOSIS — D509 Iron deficiency anemia, unspecified: Secondary | ICD-10-CM | POA: Diagnosis not present

## 2016-07-07 DIAGNOSIS — N186 End stage renal disease: Secondary | ICD-10-CM | POA: Diagnosis not present

## 2016-07-10 DIAGNOSIS — D631 Anemia in chronic kidney disease: Secondary | ICD-10-CM | POA: Diagnosis not present

## 2016-07-10 DIAGNOSIS — D509 Iron deficiency anemia, unspecified: Secondary | ICD-10-CM | POA: Diagnosis not present

## 2016-07-10 DIAGNOSIS — N2581 Secondary hyperparathyroidism of renal origin: Secondary | ICD-10-CM | POA: Diagnosis not present

## 2016-07-10 DIAGNOSIS — N186 End stage renal disease: Secondary | ICD-10-CM | POA: Diagnosis not present

## 2016-07-12 DIAGNOSIS — N2581 Secondary hyperparathyroidism of renal origin: Secondary | ICD-10-CM | POA: Diagnosis not present

## 2016-07-12 DIAGNOSIS — D509 Iron deficiency anemia, unspecified: Secondary | ICD-10-CM | POA: Diagnosis not present

## 2016-07-12 DIAGNOSIS — N186 End stage renal disease: Secondary | ICD-10-CM | POA: Diagnosis not present

## 2016-07-12 DIAGNOSIS — D631 Anemia in chronic kidney disease: Secondary | ICD-10-CM | POA: Diagnosis not present

## 2016-07-13 DIAGNOSIS — N186 End stage renal disease: Secondary | ICD-10-CM | POA: Diagnosis not present

## 2016-07-13 DIAGNOSIS — N2889 Other specified disorders of kidney and ureter: Secondary | ICD-10-CM | POA: Diagnosis not present

## 2016-07-13 DIAGNOSIS — Z992 Dependence on renal dialysis: Secondary | ICD-10-CM | POA: Diagnosis not present

## 2016-07-14 DIAGNOSIS — N2581 Secondary hyperparathyroidism of renal origin: Secondary | ICD-10-CM | POA: Diagnosis not present

## 2016-07-14 DIAGNOSIS — D631 Anemia in chronic kidney disease: Secondary | ICD-10-CM | POA: Diagnosis not present

## 2016-07-14 DIAGNOSIS — D509 Iron deficiency anemia, unspecified: Secondary | ICD-10-CM | POA: Diagnosis not present

## 2016-07-14 DIAGNOSIS — N186 End stage renal disease: Secondary | ICD-10-CM | POA: Diagnosis not present

## 2016-07-15 NOTE — Addendum Note (Signed)
Addendum  created 07/15/16 0847 by Duane Boston, MD   Sign clinical note

## 2016-07-17 DIAGNOSIS — D631 Anemia in chronic kidney disease: Secondary | ICD-10-CM | POA: Diagnosis not present

## 2016-07-17 DIAGNOSIS — D509 Iron deficiency anemia, unspecified: Secondary | ICD-10-CM | POA: Diagnosis not present

## 2016-07-17 DIAGNOSIS — N2581 Secondary hyperparathyroidism of renal origin: Secondary | ICD-10-CM | POA: Diagnosis not present

## 2016-07-17 DIAGNOSIS — N186 End stage renal disease: Secondary | ICD-10-CM | POA: Diagnosis not present

## 2016-07-19 DIAGNOSIS — N186 End stage renal disease: Secondary | ICD-10-CM | POA: Diagnosis not present

## 2016-07-19 DIAGNOSIS — D631 Anemia in chronic kidney disease: Secondary | ICD-10-CM | POA: Diagnosis not present

## 2016-07-19 DIAGNOSIS — N2581 Secondary hyperparathyroidism of renal origin: Secondary | ICD-10-CM | POA: Diagnosis not present

## 2016-07-19 DIAGNOSIS — D509 Iron deficiency anemia, unspecified: Secondary | ICD-10-CM | POA: Diagnosis not present

## 2016-07-21 DIAGNOSIS — D631 Anemia in chronic kidney disease: Secondary | ICD-10-CM | POA: Diagnosis not present

## 2016-07-21 DIAGNOSIS — D509 Iron deficiency anemia, unspecified: Secondary | ICD-10-CM | POA: Diagnosis not present

## 2016-07-21 DIAGNOSIS — N186 End stage renal disease: Secondary | ICD-10-CM | POA: Diagnosis not present

## 2016-07-21 DIAGNOSIS — N2581 Secondary hyperparathyroidism of renal origin: Secondary | ICD-10-CM | POA: Diagnosis not present

## 2016-07-24 DIAGNOSIS — D509 Iron deficiency anemia, unspecified: Secondary | ICD-10-CM | POA: Diagnosis not present

## 2016-07-24 DIAGNOSIS — N2581 Secondary hyperparathyroidism of renal origin: Secondary | ICD-10-CM | POA: Diagnosis not present

## 2016-07-24 DIAGNOSIS — N186 End stage renal disease: Secondary | ICD-10-CM | POA: Diagnosis not present

## 2016-07-24 DIAGNOSIS — D631 Anemia in chronic kidney disease: Secondary | ICD-10-CM | POA: Diagnosis not present

## 2016-07-26 DIAGNOSIS — N2581 Secondary hyperparathyroidism of renal origin: Secondary | ICD-10-CM | POA: Diagnosis not present

## 2016-07-26 DIAGNOSIS — D631 Anemia in chronic kidney disease: Secondary | ICD-10-CM | POA: Diagnosis not present

## 2016-07-26 DIAGNOSIS — D509 Iron deficiency anemia, unspecified: Secondary | ICD-10-CM | POA: Diagnosis not present

## 2016-07-26 DIAGNOSIS — N186 End stage renal disease: Secondary | ICD-10-CM | POA: Diagnosis not present

## 2016-07-28 DIAGNOSIS — D631 Anemia in chronic kidney disease: Secondary | ICD-10-CM | POA: Diagnosis not present

## 2016-07-28 DIAGNOSIS — N2581 Secondary hyperparathyroidism of renal origin: Secondary | ICD-10-CM | POA: Diagnosis not present

## 2016-07-28 DIAGNOSIS — D509 Iron deficiency anemia, unspecified: Secondary | ICD-10-CM | POA: Diagnosis not present

## 2016-07-28 DIAGNOSIS — N186 End stage renal disease: Secondary | ICD-10-CM | POA: Diagnosis not present

## 2016-07-31 DIAGNOSIS — N186 End stage renal disease: Secondary | ICD-10-CM | POA: Diagnosis not present

## 2016-07-31 DIAGNOSIS — D631 Anemia in chronic kidney disease: Secondary | ICD-10-CM | POA: Diagnosis not present

## 2016-07-31 DIAGNOSIS — N2581 Secondary hyperparathyroidism of renal origin: Secondary | ICD-10-CM | POA: Diagnosis not present

## 2016-07-31 DIAGNOSIS — D509 Iron deficiency anemia, unspecified: Secondary | ICD-10-CM | POA: Diagnosis not present

## 2016-08-02 DIAGNOSIS — N186 End stage renal disease: Secondary | ICD-10-CM | POA: Diagnosis not present

## 2016-08-02 DIAGNOSIS — N2581 Secondary hyperparathyroidism of renal origin: Secondary | ICD-10-CM | POA: Diagnosis not present

## 2016-08-02 DIAGNOSIS — D509 Iron deficiency anemia, unspecified: Secondary | ICD-10-CM | POA: Diagnosis not present

## 2016-08-02 DIAGNOSIS — D631 Anemia in chronic kidney disease: Secondary | ICD-10-CM | POA: Diagnosis not present

## 2016-08-04 DIAGNOSIS — N186 End stage renal disease: Secondary | ICD-10-CM | POA: Diagnosis not present

## 2016-08-04 DIAGNOSIS — D631 Anemia in chronic kidney disease: Secondary | ICD-10-CM | POA: Diagnosis not present

## 2016-08-04 DIAGNOSIS — N2581 Secondary hyperparathyroidism of renal origin: Secondary | ICD-10-CM | POA: Diagnosis not present

## 2016-08-04 DIAGNOSIS — D509 Iron deficiency anemia, unspecified: Secondary | ICD-10-CM | POA: Diagnosis not present

## 2016-08-07 DIAGNOSIS — N2581 Secondary hyperparathyroidism of renal origin: Secondary | ICD-10-CM | POA: Diagnosis not present

## 2016-08-07 DIAGNOSIS — D509 Iron deficiency anemia, unspecified: Secondary | ICD-10-CM | POA: Diagnosis not present

## 2016-08-07 DIAGNOSIS — N186 End stage renal disease: Secondary | ICD-10-CM | POA: Diagnosis not present

## 2016-08-07 DIAGNOSIS — D631 Anemia in chronic kidney disease: Secondary | ICD-10-CM | POA: Diagnosis not present

## 2016-08-09 DIAGNOSIS — N186 End stage renal disease: Secondary | ICD-10-CM | POA: Diagnosis not present

## 2016-08-09 DIAGNOSIS — D509 Iron deficiency anemia, unspecified: Secondary | ICD-10-CM | POA: Diagnosis not present

## 2016-08-09 DIAGNOSIS — D631 Anemia in chronic kidney disease: Secondary | ICD-10-CM | POA: Diagnosis not present

## 2016-08-09 DIAGNOSIS — N2581 Secondary hyperparathyroidism of renal origin: Secondary | ICD-10-CM | POA: Diagnosis not present

## 2016-08-11 DIAGNOSIS — N186 End stage renal disease: Secondary | ICD-10-CM | POA: Diagnosis not present

## 2016-08-11 DIAGNOSIS — D509 Iron deficiency anemia, unspecified: Secondary | ICD-10-CM | POA: Diagnosis not present

## 2016-08-11 DIAGNOSIS — N2581 Secondary hyperparathyroidism of renal origin: Secondary | ICD-10-CM | POA: Diagnosis not present

## 2016-08-11 DIAGNOSIS — D631 Anemia in chronic kidney disease: Secondary | ICD-10-CM | POA: Diagnosis not present

## 2016-08-12 DIAGNOSIS — N186 End stage renal disease: Secondary | ICD-10-CM | POA: Diagnosis not present

## 2016-08-12 DIAGNOSIS — N2889 Other specified disorders of kidney and ureter: Secondary | ICD-10-CM | POA: Diagnosis not present

## 2016-08-12 DIAGNOSIS — Z992 Dependence on renal dialysis: Secondary | ICD-10-CM | POA: Diagnosis not present

## 2016-08-14 DIAGNOSIS — D509 Iron deficiency anemia, unspecified: Secondary | ICD-10-CM | POA: Diagnosis not present

## 2016-08-14 DIAGNOSIS — D631 Anemia in chronic kidney disease: Secondary | ICD-10-CM | POA: Diagnosis not present

## 2016-08-14 DIAGNOSIS — N2581 Secondary hyperparathyroidism of renal origin: Secondary | ICD-10-CM | POA: Diagnosis not present

## 2016-08-14 DIAGNOSIS — N186 End stage renal disease: Secondary | ICD-10-CM | POA: Diagnosis not present

## 2016-08-16 DIAGNOSIS — N2581 Secondary hyperparathyroidism of renal origin: Secondary | ICD-10-CM | POA: Diagnosis not present

## 2016-08-16 DIAGNOSIS — N186 End stage renal disease: Secondary | ICD-10-CM | POA: Diagnosis not present

## 2016-08-16 DIAGNOSIS — D631 Anemia in chronic kidney disease: Secondary | ICD-10-CM | POA: Diagnosis not present

## 2016-08-16 DIAGNOSIS — D509 Iron deficiency anemia, unspecified: Secondary | ICD-10-CM | POA: Diagnosis not present

## 2016-08-18 DIAGNOSIS — N2581 Secondary hyperparathyroidism of renal origin: Secondary | ICD-10-CM | POA: Diagnosis not present

## 2016-08-18 DIAGNOSIS — N186 End stage renal disease: Secondary | ICD-10-CM | POA: Diagnosis not present

## 2016-08-18 DIAGNOSIS — D509 Iron deficiency anemia, unspecified: Secondary | ICD-10-CM | POA: Diagnosis not present

## 2016-08-18 DIAGNOSIS — D631 Anemia in chronic kidney disease: Secondary | ICD-10-CM | POA: Diagnosis not present

## 2016-08-21 DIAGNOSIS — D509 Iron deficiency anemia, unspecified: Secondary | ICD-10-CM | POA: Diagnosis not present

## 2016-08-21 DIAGNOSIS — D631 Anemia in chronic kidney disease: Secondary | ICD-10-CM | POA: Diagnosis not present

## 2016-08-21 DIAGNOSIS — N186 End stage renal disease: Secondary | ICD-10-CM | POA: Diagnosis not present

## 2016-08-21 DIAGNOSIS — N2581 Secondary hyperparathyroidism of renal origin: Secondary | ICD-10-CM | POA: Diagnosis not present

## 2016-08-23 DIAGNOSIS — N2581 Secondary hyperparathyroidism of renal origin: Secondary | ICD-10-CM | POA: Diagnosis not present

## 2016-08-23 DIAGNOSIS — D509 Iron deficiency anemia, unspecified: Secondary | ICD-10-CM | POA: Diagnosis not present

## 2016-08-23 DIAGNOSIS — D631 Anemia in chronic kidney disease: Secondary | ICD-10-CM | POA: Diagnosis not present

## 2016-08-23 DIAGNOSIS — N186 End stage renal disease: Secondary | ICD-10-CM | POA: Diagnosis not present

## 2016-08-25 DIAGNOSIS — D631 Anemia in chronic kidney disease: Secondary | ICD-10-CM | POA: Diagnosis not present

## 2016-08-25 DIAGNOSIS — D509 Iron deficiency anemia, unspecified: Secondary | ICD-10-CM | POA: Diagnosis not present

## 2016-08-25 DIAGNOSIS — N2581 Secondary hyperparathyroidism of renal origin: Secondary | ICD-10-CM | POA: Diagnosis not present

## 2016-08-25 DIAGNOSIS — N186 End stage renal disease: Secondary | ICD-10-CM | POA: Diagnosis not present

## 2016-08-26 ENCOUNTER — Inpatient Hospital Stay (HOSPITAL_COMMUNITY)
Admission: EM | Admit: 2016-08-26 | Discharge: 2016-08-29 | DRG: 190 | Disposition: A | Discharge status: Home Health | Payer: Medicare Other | Attending: Internal Medicine | Admitting: Internal Medicine

## 2016-08-26 ENCOUNTER — Emergency Department (HOSPITAL_COMMUNITY): Payer: Medicare Other

## 2016-08-26 ENCOUNTER — Encounter (HOSPITAL_COMMUNITY): Payer: Self-pay | Admitting: Emergency Medicine

## 2016-08-26 DIAGNOSIS — I252 Old myocardial infarction: Secondary | ICD-10-CM

## 2016-08-26 DIAGNOSIS — J9600 Acute respiratory failure, unspecified whether with hypoxia or hypercapnia: Secondary | ICD-10-CM | POA: Diagnosis present

## 2016-08-26 DIAGNOSIS — E43 Unspecified severe protein-calorie malnutrition: Secondary | ICD-10-CM | POA: Insufficient documentation

## 2016-08-26 DIAGNOSIS — Z09 Encounter for follow-up examination after completed treatment for conditions other than malignant neoplasm: Secondary | ICD-10-CM

## 2016-08-26 DIAGNOSIS — Z992 Dependence on renal dialysis: Secondary | ICD-10-CM

## 2016-08-26 DIAGNOSIS — R05 Cough: Secondary | ICD-10-CM

## 2016-08-26 DIAGNOSIS — D631 Anemia in chronic kidney disease: Secondary | ICD-10-CM | POA: Diagnosis not present

## 2016-08-26 DIAGNOSIS — N186 End stage renal disease: Secondary | ICD-10-CM | POA: Diagnosis not present

## 2016-08-26 DIAGNOSIS — J9601 Acute respiratory failure with hypoxia: Secondary | ICD-10-CM | POA: Diagnosis not present

## 2016-08-26 DIAGNOSIS — I272 Pulmonary hypertension, unspecified: Secondary | ICD-10-CM | POA: Diagnosis present

## 2016-08-26 DIAGNOSIS — I5042 Chronic combined systolic (congestive) and diastolic (congestive) heart failure: Secondary | ICD-10-CM | POA: Diagnosis present

## 2016-08-26 DIAGNOSIS — E785 Hyperlipidemia, unspecified: Secondary | ICD-10-CM | POA: Diagnosis present

## 2016-08-26 DIAGNOSIS — Z87410 Personal history of cervical dysplasia: Secondary | ICD-10-CM | POA: Diagnosis not present

## 2016-08-26 DIAGNOSIS — Z6821 Body mass index (BMI) 21.0-21.9, adult: Secondary | ICD-10-CM

## 2016-08-26 DIAGNOSIS — R0603 Acute respiratory distress: Secondary | ICD-10-CM

## 2016-08-26 DIAGNOSIS — I509 Heart failure, unspecified: Secondary | ICD-10-CM | POA: Diagnosis present

## 2016-08-26 DIAGNOSIS — K219 Gastro-esophageal reflux disease without esophagitis: Secondary | ICD-10-CM | POA: Diagnosis present

## 2016-08-26 DIAGNOSIS — I10 Essential (primary) hypertension: Secondary | ICD-10-CM | POA: Diagnosis present

## 2016-08-26 DIAGNOSIS — E8889 Other specified metabolic disorders: Secondary | ICD-10-CM | POA: Diagnosis present

## 2016-08-26 DIAGNOSIS — F1010 Alcohol abuse, uncomplicated: Secondary | ICD-10-CM | POA: Diagnosis present

## 2016-08-26 DIAGNOSIS — Z7982 Long term (current) use of aspirin: Secondary | ICD-10-CM | POA: Diagnosis not present

## 2016-08-26 DIAGNOSIS — I2510 Atherosclerotic heart disease of native coronary artery without angina pectoris: Secondary | ICD-10-CM | POA: Diagnosis present

## 2016-08-26 DIAGNOSIS — E440 Moderate protein-calorie malnutrition: Secondary | ICD-10-CM | POA: Diagnosis not present

## 2016-08-26 DIAGNOSIS — M109 Gout, unspecified: Secondary | ICD-10-CM | POA: Diagnosis present

## 2016-08-26 DIAGNOSIS — R0902 Hypoxemia: Secondary | ICD-10-CM | POA: Diagnosis not present

## 2016-08-26 DIAGNOSIS — Z8249 Family history of ischemic heart disease and other diseases of the circulatory system: Secondary | ICD-10-CM

## 2016-08-26 DIAGNOSIS — F17210 Nicotine dependence, cigarettes, uncomplicated: Secondary | ICD-10-CM | POA: Diagnosis present

## 2016-08-26 DIAGNOSIS — I132 Hypertensive heart and chronic kidney disease with heart failure and with stage 5 chronic kidney disease, or end stage renal disease: Secondary | ICD-10-CM | POA: Diagnosis present

## 2016-08-26 DIAGNOSIS — N2581 Secondary hyperparathyroidism of renal origin: Secondary | ICD-10-CM | POA: Diagnosis present

## 2016-08-26 DIAGNOSIS — F17200 Nicotine dependence, unspecified, uncomplicated: Secondary | ICD-10-CM | POA: Diagnosis present

## 2016-08-26 DIAGNOSIS — J441 Chronic obstructive pulmonary disease with (acute) exacerbation: Principal | ICD-10-CM | POA: Diagnosis present

## 2016-08-26 DIAGNOSIS — Z79899 Other long term (current) drug therapy: Secondary | ICD-10-CM

## 2016-08-26 DIAGNOSIS — R0602 Shortness of breath: Secondary | ICD-10-CM | POA: Diagnosis not present

## 2016-08-26 DIAGNOSIS — I120 Hypertensive chronic kidney disease with stage 5 chronic kidney disease or end stage renal disease: Secondary | ICD-10-CM | POA: Diagnosis not present

## 2016-08-26 DIAGNOSIS — R059 Cough, unspecified: Secondary | ICD-10-CM

## 2016-08-26 LAB — TROPONIN I: TROPONIN I: 0.09 ng/mL — AB (ref <0.03)

## 2016-08-26 LAB — HEPATIC FUNCTION PANEL
ALBUMIN: 3.4 g/dL — AB (ref 3.5–5.0)
ALT: 10 U/L — ABNORMAL LOW (ref 14–54)
AST: 22 U/L (ref 15–41)
Alkaline Phosphatase: 96 U/L (ref 38–126)
Bilirubin, Direct: 0.2 mg/dL (ref 0.1–0.5)
Indirect Bilirubin: 0.6 mg/dL (ref 0.3–0.9)
TOTAL PROTEIN: 8.1 g/dL (ref 6.5–8.1)
Total Bilirubin: 0.8 mg/dL (ref 0.3–1.2)

## 2016-08-26 LAB — BASIC METABOLIC PANEL
ANION GAP: 12 (ref 5–15)
BUN: 16 mg/dL (ref 6–20)
CO2: 29 mmol/L (ref 22–32)
Calcium: 9.7 mg/dL (ref 8.9–10.3)
Chloride: 93 mmol/L — ABNORMAL LOW (ref 101–111)
Creatinine, Ser: 4.98 mg/dL — ABNORMAL HIGH (ref 0.44–1.00)
GFR, EST AFRICAN AMERICAN: 9 mL/min — AB (ref >60)
GFR, EST NON AFRICAN AMERICAN: 8 mL/min — AB (ref >60)
GLUCOSE: 116 mg/dL — AB (ref 65–99)
POTASSIUM: 3.9 mmol/L (ref 3.5–5.1)
SODIUM: 134 mmol/L — AB (ref 135–145)

## 2016-08-26 LAB — MRSA PCR SCREENING: MRSA by PCR: NEGATIVE

## 2016-08-26 LAB — I-STAT TROPONIN, ED: Troponin i, poc: 0.07 ng/mL (ref 0.00–0.08)

## 2016-08-26 LAB — CBC
HEMATOCRIT: 37.2 % (ref 36.0–46.0)
HEMOGLOBIN: 12.1 g/dL (ref 12.0–15.0)
MCH: 30.7 pg (ref 26.0–34.0)
MCHC: 32.5 g/dL (ref 30.0–36.0)
MCV: 94.4 fL (ref 78.0–100.0)
Platelets: 155 10*3/uL (ref 150–400)
RBC: 3.94 MIL/uL (ref 3.87–5.11)
RDW: 16.5 % — ABNORMAL HIGH (ref 11.5–15.5)
WBC: 6.1 10*3/uL (ref 4.0–10.5)

## 2016-08-26 LAB — BRAIN NATRIURETIC PEPTIDE: B Natriuretic Peptide: >4500 pg/mL — ABNORMAL HIGH (ref 0.0–100.0)

## 2016-08-26 MED ORDER — PENTAFLUOROPROP-TETRAFLUOROETH EX AERO: 1.0000 "application " | INHALATION_SPRAY | CUTANEOUS | Status: DC | PRN

## 2016-08-26 MED ORDER — HYDRALAZINE HCL 20 MG/ML IJ SOLN: 10.0000 mg | Freq: Three times a day (TID) | INTRAMUSCULAR | Status: DC | PRN

## 2016-08-26 MED ORDER — ASPIRIN EC 325 MG PO TBEC
325.0000 mg | DELAYED_RELEASE_TABLET | Freq: Every day | ORAL | Status: DC
  Administered 2016-08-27 – 2016-08-29 (×3): 325 mg via ORAL
  Filled 2016-08-26 (×3): qty 1

## 2016-08-26 MED ORDER — ALTEPLASE 2 MG IJ SOLR: 2.0000 mg | Freq: Once | INTRAMUSCULAR | Status: DC | PRN

## 2016-08-26 MED ORDER — ONDANSETRON HCL 4 MG/2ML IJ SOLN: 4.0000 mg | Freq: Four times a day (QID) | INTRAMUSCULAR | Status: DC | PRN

## 2016-08-26 MED ORDER — SUCROFERRIC OXYHYDROXIDE 500 MG PO CHEW
1000.0000 mg | CHEWABLE_TABLET | Freq: Three times a day (TID) | ORAL | Status: DC
  Administered 2016-08-27 – 2016-08-29 (×4): 1000 mg via ORAL
  Filled 2016-08-26 (×9): qty 2

## 2016-08-26 MED ORDER — HEPARIN SODIUM (PORCINE) 5000 UNIT/ML IJ SOLN
5000.0000 [IU] | Freq: Three times a day (TID) | INTRAMUSCULAR | Status: DC
  Administered 2016-08-26 – 2016-08-29 (×8): 5000 [IU] via SUBCUTANEOUS
  Filled 2016-08-26 (×7): qty 1

## 2016-08-26 MED ORDER — HEPARIN SODIUM (PORCINE) 1000 UNIT/ML DIALYSIS: 20.0000 [IU]/kg | INTRAMUSCULAR | Status: DC | PRN

## 2016-08-26 MED ORDER — CALCITRIOL 0.5 MCG PO CAPS
1.2500 ug | ORAL_CAPSULE | ORAL | Status: DC
  Administered 2016-08-28: 1.25 ug via ORAL

## 2016-08-26 MED ORDER — NITROGLYCERIN 2 % TD OINT
1.0000 [in_us] | TOPICAL_OINTMENT | Freq: Four times a day (QID) | TRANSDERMAL | Status: DC
  Administered 2016-08-26 – 2016-08-28 (×7): 1 [in_us] via TOPICAL
  Filled 2016-08-26: qty 30
  Filled 2016-08-26: qty 1

## 2016-08-26 MED ORDER — IPRATROPIUM-ALBUTEROL 0.5-2.5 (3) MG/3ML IN SOLN
3.0000 mL | Freq: Once | RESPIRATORY_TRACT | Status: AC
  Administered 2016-08-26: 3 mL via RESPIRATORY_TRACT
  Filled 2016-08-26: qty 3

## 2016-08-26 MED ORDER — ACETAMINOPHEN 650 MG RE SUPP: 650.0000 mg | Freq: Four times a day (QID) | RECTAL | Status: DC | PRN

## 2016-08-26 MED ORDER — ATORVASTATIN CALCIUM 10 MG PO TABS
10.0000 mg | ORAL_TABLET | Freq: Every day | ORAL | Status: DC
  Administered 2016-08-27 – 2016-08-28 (×2): 10 mg via ORAL
  Filled 2016-08-26 (×2): qty 1

## 2016-08-26 MED ORDER — HEPARIN SODIUM (PORCINE) 1000 UNIT/ML DIALYSIS: 1000.0000 [IU] | INTRAMUSCULAR | Status: DC | PRN

## 2016-08-26 MED ORDER — ALBUTEROL SULFATE (2.5 MG/3ML) 0.083% IN NEBU
5.0000 mg | INHALATION_SOLUTION | Freq: Once | RESPIRATORY_TRACT | Status: AC
  Administered 2016-08-26: 5 mg via RESPIRATORY_TRACT
  Filled 2016-08-26: qty 6

## 2016-08-26 MED ORDER — SODIUM CHLORIDE 0.9% FLUSH
3.0000 mL | Freq: Two times a day (BID) | INTRAVENOUS | Status: DC
  Administered 2016-08-26 – 2016-08-29 (×4): 3 mL via INTRAVENOUS

## 2016-08-26 MED ORDER — ACETAMINOPHEN 325 MG PO TABS: 650.0000 mg | ORAL_TABLET | Freq: Four times a day (QID) | ORAL | Status: DC | PRN

## 2016-08-26 MED ORDER — SODIUM CHLORIDE 0.9 % IV SOLN: 100.0000 mL | INTRAVENOUS | Status: DC | PRN

## 2016-08-26 MED ORDER — LIDOCAINE-PRILOCAINE 2.5-2.5 % EX CREA: 1.0000 "application " | TOPICAL_CREAM | CUTANEOUS | Status: DC | PRN

## 2016-08-26 MED ORDER — ALLOPURINOL 100 MG PO TABS
100.0000 mg | ORAL_TABLET | Freq: Every day | ORAL | Status: DC
  Administered 2016-08-27 – 2016-08-29 (×3): 100 mg via ORAL
  Filled 2016-08-26 (×3): qty 1

## 2016-08-26 MED ORDER — ONDANSETRON HCL 4 MG PO TABS: 4.0000 mg | ORAL_TABLET | Freq: Four times a day (QID) | ORAL | Status: DC | PRN

## 2016-08-26 MED ORDER — RENA-VITE PO TABS
1.0000 | ORAL_TABLET | Freq: Every day | ORAL | Status: DC
  Administered 2016-08-27 – 2016-08-28 (×2): 1 via ORAL
  Filled 2016-08-26 (×2): qty 1

## 2016-08-26 MED ORDER — ATENOLOL 50 MG PO TABS
100.0000 mg | ORAL_TABLET | Freq: Every day | ORAL | Status: DC
  Administered 2016-08-27 – 2016-08-29 (×3): 100 mg via ORAL
  Filled 2016-08-26 (×3): qty 2

## 2016-08-26 MED ORDER — LIDOCAINE HCL (PF) 1 % IJ SOLN: 5.0000 mL | INTRAMUSCULAR | Status: DC | PRN

## 2016-08-26 NOTE — ED Notes (Signed)
[phone] Daughter [name]

## 2016-08-26 NOTE — ED Notes (Signed)
Xray at bedside,

## 2016-08-26 NOTE — ED Triage Notes (Signed)
Pt states starting last night c/o CP, sob, cough. Pt dialysis pt, hx of MI, CHF. Pt had dialysis yesterday without issues.

## 2016-08-26 NOTE — ED Notes (Signed)
EDP at bedside  

## 2016-08-26 NOTE — ED Notes (Signed)
RN  Ordered  patient lunch per EDP request

## 2016-08-26 NOTE — Progress Notes (Addendum)
CRITICAL VALUE ALERT  Critical Value:  Troponin 0.09  Date & Time Notied:  08/26/16  Provider Notified: Dr. Aggie Moats  Orders Received/Actions taken: no new orders given this time

## 2016-08-26 NOTE — Procedures (Signed)
  I was present at this dialysis session, have reviewed the session itself and made  appropriate changes Kelly Splinter MD [place] pager [phone]   08/26/2016, 5:06 PM

## 2016-08-26 NOTE — ED Notes (Signed)
Called Lab advised to add BNP and Hepatic

## 2016-08-26 NOTE — Consult Note (Signed)
[facility] Renal Consultation Note    Indication for Consultation:  Management of ESRD/hemodialysis; anemia, hypertension/volume and secondary hyperparathyroidism PCP: Glendale Chard, MD   HPI: Jessica Clarke is a 66 y.o. 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  She has been having lower extremity edema and ^^ BP and gradually had her EDW lowered 3 kg over the past 6 weeks. She has been using O2 at dialysis but not at home.  She feels better at the end of dialysis than at the beginning.   CXR on admission showed excess volume, initial trop 0.07, EKG no acute changes.  BNP elevated. She had a breathing treatment in the ED without relief of SOB/wheezing.  She is vague about her history of wheezing (I have not seen her wheezing this way at HD). She denies  CP or pain anywhere, N, V, D, fever.  She has chills.  She just tried to have a BM and only passed mucous. She has + cough, SOB.  Cannot tell if she is more SOB when supine.  Her daughter brought her to the ED today . She drinks about 1 beer per day, smokes 1 PPD and smokes weed about 3 x a week- nothing stronger.  She is not on O2 when seen in the ED (took off when she went to the [facility])  - sats are in the low 90s - variable. She lives alone.  Past Medical History:  Diagnosis Date  . Abnormal Pap smear    ASCUS ?HGSIL  . Anemia   . Cervical dysplasia   . CHF (congestive heart failure) ([place])   . Chronic kidney disease   . Constipation   . Constipation   . GERD (gastroesophageal reflux disease)   . Gout   . Hyperlipidemia   . Hypertension   . Myocardial infarction ([place]) 2015  . Obesity   . Pneumonia   . Uterine polyp    Past Surgical History:  Procedure Laterality Date  . AV FISTULA PLACEMENT  01/18/2012   Procedure:  ARTERIOVENOUS (AV) FISTULA CREATION;  Surgeon: Angelia Mould, MD;  Location: [place];  Service: Vascular;  Laterality: Left;  . DILATION AND CURETTAGE OF UTERUS    . HYSTEROSCOPY    . LEEP    . LEFT HEART CATHETERIZATION WITH CORONARY ANGIOGRAM N/A 08/26/2013   Procedure: LEFT HEART CATHETERIZATION WITH CORONARY ANGIOGRAM;  Surgeon: Leonie Man, MD;  Location: [facility] CATH LAB;  Service: Cardiovascular;  Laterality: N/A;  . REVISON OF ARTERIOVENOUS FISTULA Left 07/18/5407   Procedure: PLICATION OF ARTERIOVENOUS FISTULA;  Surgeon: Conrad Kranzburg, MD;  Location: [place];  Service: Vascular;  Laterality: Left;   Family History  Problem Relation Age of Onset  . Esophageal cancer Unknown   . Pancreatic cancer Unknown   . Heart disease Mother   . Hypertension Mother   . Breast cancer Sister   . Esophageal cancer Sister   . Cancer Sister   . Deep vein thrombosis Sister   . Diabetes Sister   . Hypertension Sister   . Hypertension Daughter   . Colon cancer Neg Hx    Social History:  reports that she has been smoking Cigarettes.  She has a 15.00 pack-year smoking history. She has never used smokeless tobacco. She reports that she drinks  about 3.6 oz of alcohol per week . She reports that she does not use drugs. Allergies  Allergen Reactions  . No Known Allergies    Prior to Admission medications   Medication Sig Start Date End Date Taking? Authorizing Provider  allopurinol (ZYLOPRIM) 100 MG tablet Take 100 mg by mouth daily.     Yes [provider]  aspirin EC 325 MG tablet Take 1 tablet (325 mg total) by mouth daily. 08/27/13  Yes Thurnell Lose, MD  atenolol (TENORMIN) 100 MG tablet Take 1 tablet (100 mg total) by mouth daily. 08/27/13  Yes Thurnell Lose, MD  atorvastatin (LIPITOR) 10 MG tablet  01/30/16  Yes [provider]  Multiple Vitamin (MULTIVITAMIN WITH MINERALS) TABS tablet Take 1 tablet by mouth daily. 08/27/13  Yes Thurnell Lose, MD  VELPHORO 500 MG  chewable tablet Chew 2 tablets by mouth 3 (three) times daily. 06/30/16  Yes [provider]  oxyCODONE-acetaminophen (PERCOCET/ROXICET) 5-325 MG tablet Take 1 tablet by mouth every 6 (six) hours as needed. Patient not taking: Reported on 04/13/2016 03/14/16   Alvia Grove, PA-C   Current Facility-Administered Medications  Medication Dose Route Frequency Provider Last Rate Last Dose  . acetaminophen (TYLENOL) tablet 650 mg  650 mg Oral Q6H PRN Elwin Mocha, MD       Or  . acetaminophen (TYLENOL) suppository 650 mg  650 mg Rectal Q6H PRN Elwin Mocha, MD      . allopurinol (ZYLOPRIM) tablet 100 mg  100 mg Oral Daily Elwin Mocha, MD      . aspirin EC tablet 325 mg  325 mg Oral Daily Elwin Mocha, MD      . atenolol (TENORMIN) tablet 100 mg  100 mg Oral Daily Elwin Mocha, MD      . atorvastatin (LIPITOR) tablet 10 mg  10 mg Oral q1800 Elwin Mocha, MD      . Derrill Memo ON 08/28/2016] calcitRIOL (ROCALTROL) capsule 1.25 mcg  1.25 mcg Oral Q M,W,F-HD Alric Seton, PA-C      . heparin injection 5,000 Units  5,000 Units Subcutaneous Q8H Elwin Mocha, MD      . hydrALAZINE (APRESOLINE) injection 10 mg  10 mg Intravenous Q8H PRN Elwin Mocha, MD      . nitroGLYCERIN (NITROGLYN) 2 % ointment 1 inch  1 inch Topical Q6H Quintella Reichert, MD   1 inch at 08/26/16 1243  . ondansetron (ZOFRAN) tablet 4 mg  4 mg Oral Q6H PRN Elwin Mocha, MD       Or  . ondansetron [facility]) injection 4 mg  4 mg Intravenous Q6H PRN Elwin Mocha, MD      . sodium chloride flush (NS) 0.9 % injection 3 mL  3 mL Intravenous Q12H Elwin Mocha, MD       Current Outpatient Prescriptions  Medication Sig Dispense Refill  . allopurinol (ZYLOPRIM) 100 MG tablet Take 100 mg by mouth daily.      Marland Kitchen aspirin EC 325 MG tablet Take 1 tablet (325 mg total) by mouth daily. 30 tablet 0  . atenolol (TENORMIN) 100 MG tablet Take 1 tablet (100 mg total) by mouth daily. 60 tablet 0  .  atorvastatin (LIPITOR) 10 MG tablet   0  . Multiple Vitamin (MULTIVITAMIN WITH MINERALS) TABS tablet Take 1 tablet by mouth daily. 30 tablet 0  . VELPHORO 500 MG chewable tablet Chew 2 tablets by mouth 3 (three) times daily.    Marland Kitchen  oxyCODONE-acetaminophen (PERCOCET/ROXICET) 5-325 MG tablet Take 1 tablet by mouth every 6 (six) hours as needed. (Patient not taking: Reported on 04/13/2016) 10 tablet 0   Labs: Basic Metabolic Panel:  Recent Labs Lab 08/26/16 0857  NA 134*  K 3.9  CL 93*  CO2 29  GLUCOSE 116*  BUN 16  CREATININE 4.98*  CALCIUM 9.7   Liver Function Tests:  Recent Labs Lab 08/26/16 0857  AST 22  ALT 10*  ALKPHOS 96  BILITOT 0.8  PROT 8.1  ALBUMIN 3.4*   CBC:  Recent Labs Lab 08/26/16 0857  WBC 6.1  HGB 12.1  HCT 37.2  MCV 94.4  PLT 155   Studies/Results: Dg Chest Port 1 View  Result Date: 08/26/2016 CLINICAL DATA:  Shortness of Breath EXAM: PORTABLE CHEST 1 VIEW COMPARISON:  08/25/2013 FINDINGS: Cardiomegaly with vascular congestion and interstitial prominence, likely interstitial edema. No confluent opacities or effusions. No acute bony abnormality. IMPRESSION: Cardiomegaly.  Suspect mild interstitial edema/CHF. Electronically Signed   By: Rolm Baptise M.D.   On: 08/26/2016 10:20    ROS: As per HPI otherwise negative.  Physical Exam: Vitals:   08/26/16 1357 08/26/16 1415 08/26/16 1430 08/26/16 1445  BP:  (!) 161/80 (!) 160/83 (!) 154/86  Pulse: 76 81 82 83  Resp: (!) 22   (!) 22  Temp:      TempSrc:      SpO2: 94% 92%  93%     General: WDWN somewhat SOB with talking Head: NCAT sclera not icteric MMM Neck: Supple., neck veins full Lungs: diffuse wheezes/crackles Heart: RRR with S1 S2.  Abdomen: soft NT + BS Lower extremities: without much LE edema or ischemic changes, no open wounds  Neuro: A & O  X 3. Moves all extremities spontaneously. Psych:  Responds to questions appropriately with a usual affect though a little more unfocused Dialysis  Access: left lower AVF + bruit  [facility] MWFleft lower AVF  EDW just lowered to 77.5 from 78 attained this wt Friday 2 K 2 Ca profile 4 heparin 4000 with 2K mid tmt Calcitriol 1.25 Mircera 75 q 4 weeks - last dose 7/4 - s/p short course IV Fe - for tsat 26^ hgb 10.8 7/2 iPTH 257  Assessment/Plan: 1. SOB with wheezing/cough - CXR shows pul edema/ hx of smoker- continue lower lower edw - extra treatment today - 2. ESRD -  MWF - extra tmt today due to excess volume- back on schedule Monday 4 K bath 3. Hypertension/volume  - titrating volume down - continue meds; BP high at outpatient unit; has had EDW gradually lowered 3 kg from 80.5 May 30th 4. Anemia  - hgb 12 - no ESA s/p course of Fe 5. Metabolic bone disease -  Ca 9.7 - had calcitriol yesterday - generally on 2 K bath but running 2.25 today due to 4 K bath; on velphoro 2 ac 6. Nutrition - unintentional weight loss worrisome given polysubstance hx 7. Hx alcohol abuse, tobacco abuse + cannibis use- may be contributory to weight loss -   Myriam Jacobson, PA-C [place] [phone] 08/26/2016, 2:59 PM   Pt seen, examined and agree w A/P as above. ESRD pt with SOB and pulm edema.  Has been losing wt in OP setting w/ lowering o fedw 3kg over 6 wks.  Lots of wheezing but does not have any hx of COPD/ asthma, could be due to edema.  Plan acute HD asap, getting set up now.  Use  bipap prn as bridge for [employer].   Kelly Splinter MD [employer] pager [phone]   08/26/2016, 5:03 PM

## 2016-08-26 NOTE — ED Notes (Signed)
Report given to 6E. 

## 2016-08-26 NOTE — ED Notes (Signed)
CBC and Creatinine already collected.

## 2016-08-26 NOTE — Progress Notes (Signed)
O2 sats 86% on room air.  MD notified.

## 2016-08-26 NOTE — ED Notes (Signed)
Patient eating lunch.

## 2016-08-26 NOTE — ED Notes (Signed)
Admitting at bedside,  

## 2016-08-26 NOTE — ED Notes (Signed)
Lunch at bedside 

## 2016-08-26 NOTE — ED Provider Notes (Signed)
[place] DEPT Provider Note   CSN: [id number] Arrival date & time: 08/26/16  5170     History   Chief Complaint Chief Complaint  Patient presents with  . Chest Pain    HPI Jessica Clarke is a 66 y.o. female.  The history is provided by the patient. No language interpreter was used.  Chest Pain     Jessica Clarke is a 66 y.o. female who presents to the Emergency Department complaining of SOB.  She has ESRD and is on hemodialysis, Monday, Wednesday, Friday. She was doing well after her dialysis session yesterday he went to bed about 9:00. At 9:30 she awoke with shortness of breath and cough productive of yellow sputum. She did feel a little warm at home but did not take her temperature. She denies any chest pain but states she is uncomfortable and has some epigastric discomfort. No vomiting, diarrhea. She has had prior similar symptoms in the past but does not remember what treatment she has had. She has a history of CHF and MI. She states this is significantly different from her prior MI. No history of lung disease. Past Medical History:  Diagnosis Date  . Abnormal Pap smear    ASCUS ?HGSIL  . Anemia   . Cervical dysplasia   . CHF (congestive heart failure) ([place])   . Chronic kidney disease   . Constipation   . Constipation   . GERD (gastroesophageal reflux disease)   . Gout   . Hyperlipidemia   . Hypertension   . Myocardial infarction ([place]) 2015  . Obesity   . Pneumonia   . Uterine polyp     Patient Active Problem List   Diagnosis Date Noted  . Respiratory distress 08/26/2016  . ESRD on dialysis ([place]) 06/08/2015  . CAD- non obstructive RI disease 08/26/13 08/27/2013  . NSTEMI-08/25/13- non obstructive CAD at cath 08/26/13 08/26/2013  . Chronic combined systolic and diastolic heart failure ([place]) 08/26/2013  . Alcohol abuse 08/25/2013  . Respiratory failure with hypoxia and hypercapnia ([place]) 08/25/2013  . Flash pulmonary edema ([place]) 08/25/2013  . Encounter for  adequacy testing for hemodialysis ([place]) 06/26/2012  . End stage renal disease ([place]) 12/27/2011  . ASCUS (atypical squamous cells of undetermined significance) on Pap smear 09/09/2010  . Hypertension 09/09/2010  . Cervical polyp 09/09/2010  . Smoker 09/09/2010  . Post-menopausal bleeding 09/09/2010  . Gout 09/09/2010    Past Surgical History:  Procedure Laterality Date  . AV FISTULA PLACEMENT  01/18/2012   Procedure: ARTERIOVENOUS (AV) FISTULA CREATION;  Surgeon: Angelia Mould, MD;  Location: [place];  Service: Vascular;  Laterality: Left;  . DILATION AND CURETTAGE OF UTERUS    . HYSTEROSCOPY    . LEEP    . LEFT HEART CATHETERIZATION WITH CORONARY ANGIOGRAM N/A 08/26/2013   Procedure: LEFT HEART CATHETERIZATION WITH CORONARY ANGIOGRAM;  Surgeon: Leonie Man, MD;  Location: [facility] CATH LAB;  Service: Cardiovascular;  Laterality: N/A;  . REVISON OF ARTERIOVENOUS FISTULA Left 0/17/4944   Procedure: PLICATION OF ARTERIOVENOUS FISTULA;  Surgeon: Conrad Xenia, MD;  Location: [facility] OR;  Service: Vascular;  Laterality: Left;    OB History    Gravida Para Term Preterm AB Living   2 1     1 1    SAB TAB Ectopic Multiple Live Births     1             Home Medications    Prior to Admission medications   Medication Sig Start Date End  Date Taking? Authorizing Provider  allopurinol (ZYLOPRIM) 100 MG tablet Take 100 mg by mouth daily.     Yes [provider]  aspirin EC 325 MG tablet Take 1 tablet (325 mg total) by mouth daily. 08/27/13  Yes Thurnell Lose, MD  atenolol (TENORMIN) 100 MG tablet Take 1 tablet (100 mg total) by mouth daily. 08/27/13  Yes Thurnell Lose, MD  atorvastatin (LIPITOR) 10 MG tablet  01/30/16  Yes [provider]  Multiple Vitamin (MULTIVITAMIN WITH MINERALS) TABS tablet Take 1 tablet by mouth daily. 08/27/13  Yes Thurnell Lose, MD  VELPHORO 500 MG chewable tablet Chew 2 tablets by mouth 3 (three) times daily. 06/30/16  Yes [provider]  oxyCODONE-acetaminophen (PERCOCET/ROXICET) 5-325 MG tablet Take 1 tablet by mouth every 6 (six) hours as needed. Patient not taking: Reported on 04/13/2016 03/14/16   Alvia Grove PA-C    Family History Family History  Problem Relation Age of Onset  . Esophageal cancer Unknown   . Pancreatic cancer Unknown   . Heart disease Mother   . Hypertension Mother   . Breast cancer Sister   . Esophageal cancer Sister   . Cancer Sister   . Deep vein thrombosis Sister   . Diabetes Sister   . Hypertension Sister   . Hypertension Daughter   . Colon cancer Neg Hx     Social History Social History  Substance Use Topics  . Smoking status: Current Every Day Smoker    Packs/day: 0.50    Years: 30.00    Types: Cigarettes  . Smokeless tobacco: Never Used  . Alcohol use 3.6 oz/week    6 Cans of beer per week     Allergies   No known allergies   Review of Systems Review of Systems  Cardiovascular: Positive for chest pain.  All other systems reviewed and are negative.    Physical Exam Updated Vital Signs BP (!) 187/93 (BP Location: Right Arm)   Pulse 88   Temp 97.8 F (36.6 C) (Oral)   Resp 20   Ht 5\' 11"  (1.803 m)   Wt 77.6 kg (171 lb 1.2 oz)   SpO2 (!) 86%   BMI 23.86 kg/m   Physical Exam  Constitutional: She is oriented to person, place, and time. She appears well-developed and well-nourished.  HENT:  Head: Normocephalic and atraumatic.  Cardiovascular: Normal rate and regular rhythm.   Murmur heard. Pulmonary/Chest: She is in respiratory distress.  Tachypnea with diffuse rhonchi, bibasilar crackles.  Abdominal: Soft. There is no rebound and no guarding.  Mild epigastric tenderness  Musculoskeletal: She exhibits no edema or tenderness.  Fistula in left upper extremity with palpable thrill  Neurological: She is alert and oriented to person, place, and time.  Skin: Skin is warm and dry.  Psychiatric: She has a normal mood and affect. Her behavior  is normal.  Nursing note and vitals reviewed.    ED Treatments / Results  Labs (all labs ordered are listed, but only abnormal results are displayed) Labs Reviewed  BASIC METABOLIC PANEL - Abnormal; Notable for the following:       Result Value   Sodium 134 (*)    Chloride 93 (*)    Glucose, Bld 116 (*)    Creatinine, Ser 4.98 (*)    GFR calc non Af Amer 8 (*)    GFR calc Af Amer 9 (*)    All other components within normal limits  CBC - Abnormal; Notable for the  following:    RDW 16.5 (*)    All other components within normal limits  HEPATIC FUNCTION PANEL - Abnormal; Notable for the following:    Albumin 3.4 (*)    ALT 10 (*)    All other components within normal limits  BRAIN NATRIURETIC PEPTIDE - Abnormal; Notable for the following:    B Natriuretic Peptide >4,500.0 (*)    All other components within normal limits  TROPONIN I - Abnormal; Notable for the following:    Troponin I 0.09 (*)    All other components within normal limits  MRSA PCR SCREENING  I-STAT TROPOININ, ED    EKG  EKG Interpretation  Date/Time:  Saturday August 26 2016 10:09:18 EDT Ventricular Rate:  88 PR Interval:    QRS Duration: 121 QT Interval:  389 QTC Calculation: 471 R Axis:   -12 Text Interpretation:  Sinus rhythm Probable left atrial enlargement LVH with secondary repolarization abnormality Confirmed by Hazle Coca [phone]) on 08/26/2016 10:29:09 AM       Radiology Dg Chest Port 1 View  Result Date: 08/26/2016 CLINICAL DATA:  Shortness of Breath EXAM: PORTABLE CHEST 1 VIEW COMPARISON:  08/25/2013 FINDINGS: Cardiomegaly with vascular congestion and interstitial prominence, likely interstitial edema. No confluent opacities or effusions. No acute bony abnormality. IMPRESSION: Cardiomegaly.  Suspect mild interstitial edema/CHF. Electronically Signed   By: Rolm Baptise M.D.   On: 08/26/2016 10:20    Procedures Procedures (including critical care time)  Medications Ordered in ED Medications   atorvastatin (LIPITOR) tablet 10 mg (not administered)  aspirin EC tablet 325 mg (not administered)  atenolol (TENORMIN) tablet 100 mg (not administered)  allopurinol (ZYLOPRIM) tablet 100 mg (not administered)  heparin injection 5,000 Units (not administered)  sodium chloride flush (NS) 0.9 % injection 3 mL (not administered)  acetaminophen (TYLENOL) tablet 650 mg (not administered)    Or  acetaminophen (TYLENOL) suppository 650 mg (not administered)  ondansetron (ZOFRAN) tablet 4 mg (not administered)    Or  ondansetron (ZOFRAN) injection 4 mg (not administered)  nitroGLYCERIN (NITROGLYN) 2 % ointment 1 inch (1 inch Topical Given 08/26/16 1243)  hydrALAZINE (APRESOLINE) injection 10 mg (not administered)  calcitRIOL (ROCALTROL) capsule 1.25 mcg (not administered)  sucroferric oxyhydroxide (VELPHORO) chewable tablet 1,000 mg (not administered)  multivitamin (RENA-VIT) tablet 1 tablet (not administered)  albuterol (PROVENTIL) (2.5 MG/3ML) 0.083% nebulizer solution 5 mg (5 mg Nebulization Given 08/26/16 0918)  ipratropium-albuterol (DUONEB) 0.5-2.5 (3) MG/3ML nebulizer solution 3 mL (3 mLs Nebulization Given 08/26/16 1016)     Initial Impression / Assessment and Plan / ED Course  I have reviewed the triage vital signs and the nursing notes.  Pertinent labs & imaging results that were available during my care of the patient were reviewed by me and considered in my medical decision making (see chart for details).     Patient here for evaluation of increased shortness of breath since last night. She has tachypnea on examination with diffuse rhonchi and rales. She reports feeling improved after albuterol treatments but her lung exam is clinically unchanged. Concern for CHF with pulmonary edema. She otherwise does not appear volume overloaded on examination. Hospitalist contacted for admission for further treatment/admission.  Final Clinical Impressions(s) / ED Diagnoses   Final diagnoses:    None    New Prescriptions Current Discharge Medication List       Quintella Reichert, MD 08/26/16 [phone]

## 2016-08-26 NOTE — ED Notes (Signed)
Patient in restroom.

## 2016-08-26 NOTE — Progress Notes (Signed)
Patient arrived on unit via stretcher from ED.  No family at bedside.  Telemetry placed per MD order and CMT notified.  

## 2016-08-26 NOTE — H&P (Signed)
[facility] History and Physical  Jessica Clarke XLK:[id number] DOB: Feb 21, 1950 DOA: 08/26/2016  Referring physician:  PCP: Glendale Chard, MD   Chief Complaint: "I got short of breath at dialysis and they put oxygen on me."  HPI: Jessica Clarke is a 66 y.o. female  pmhx significant for anemia, ESRD, hx of MI, GERD presents with SOB. Pt states she has been sob since her last HD on Friday. Patient states she has continued to get placed on nasal cannula oxygen while at dialysis. Felt better, she was sent home. Symptoms did not improve. Denies any chest pain. States this is happened before but not sure how things cleared up last time. Recent medication changes.  ED course: Patient given 2 breathing treatments with some relief but physical exam not improved per EDP. Chest x-ray show pulmonary edema. Hospitalist consulted for admission.   Review of Systems:  As per HPI otherwise 10 point review of systems negative.    Past Medical History:  Diagnosis Date  . Abnormal Pap smear    ASCUS ?HGSIL  . Anemia   . Cervical dysplasia   . CHF (congestive heart failure) ([place])   . Chronic kidney disease   . Constipation   . Constipation   . GERD (gastroesophageal reflux disease)   . Gout   . Hyperlipidemia   . Hypertension   . Myocardial infarction ([place]) 2015  . Obesity   . Pneumonia   . Uterine polyp    Past Surgical History:  Procedure Laterality Date  . AV FISTULA PLACEMENT  01/18/2012   Procedure: ARTERIOVENOUS (AV) FISTULA CREATION;  Surgeon: Angelia Mould, MD;  Location: [place];  Service: Vascular;  Laterality: Left;  . DILATION AND CURETTAGE OF UTERUS    . HYSTEROSCOPY    . LEEP    . LEFT HEART CATHETERIZATION WITH CORONARY ANGIOGRAM N/A 08/26/2013   Procedure: LEFT HEART CATHETERIZATION WITH CORONARY ANGIOGRAM;  Surgeon: Leonie Man, MD;  Location: [facility] CATH LAB;  Service: Cardiovascular;  Laterality: N/A;  . REVISON OF ARTERIOVENOUS FISTULA Left 3/66/4403   Procedure: PLICATION OF ARTERIOVENOUS FISTULA;  Surgeon: Conrad Southport, MD;  Location: [place];  Service: Vascular;  Laterality: Left;   Social History:  reports that she has been smoking Cigarettes.  She has a 15.00 pack-year smoking history. She has never used smokeless tobacco. She reports that she drinks about 3.6 oz of alcohol per week . She reports that she does not use drugs.  Allergies  Allergen Reactions  . No Known Allergies     Family History  Problem Relation Age of Onset  . Esophageal cancer Unknown   . Pancreatic cancer Unknown   . Heart disease Mother   . Hypertension Mother   . Breast cancer Sister   . Esophageal cancer Sister   . Cancer Sister   . Deep vein thrombosis Sister   . Diabetes Sister   . Hypertension Sister   . Hypertension Daughter   . Colon cancer Neg Hx      Prior to Admission medications   Medication Sig Start Date End Date Taking? Authorizing Provider  allopurinol (ZYLOPRIM) 100 MG tablet Take 100 mg by mouth daily.     Yes [provider]  aspirin EC 325 MG tablet Take 1 tablet (325 mg total) by mouth daily. 08/27/13  Yes Thurnell Lose, MD  atenolol (TENORMIN) 100 MG tablet Take 1 tablet (100 mg total) by mouth daily. 08/27/13  Yes Thurnell Lose, MD  atorvastatin (LIPITOR) 10 MG  tablet  01/30/16  Yes [provider]  Multiple Vitamin (MULTIVITAMIN WITH MINERALS) TABS tablet Take 1 tablet by mouth daily. 08/27/13  Yes Thurnell Lose, MD  VELPHORO 500 MG chewable tablet Chew 2 tablets by mouth 3 (three) times daily. 06/30/16  Yes [provider]  oxyCODONE-acetaminophen (PERCOCET/ROXICET) 5-325 MG tablet Take 1 tablet by mouth every 6 (six) hours as needed. Patient not taking: Reported on 04/13/2016 03/14/16   Alvia Grove, PA-C   Physical Exam: Vitals:   08/26/16 1315 08/26/16 1330 08/26/16 1345 08/26/16 1357  BP:  (!) 179/94 (!) 176/97   Pulse:  83 81 76  Resp:  (!) 26 (!) 31 (!) 22  Temp:      TempSrc:        SpO2: 94% 92%  94%    Wt Readings from Last 3 Encounters:  04/13/16 81.2 kg (179 lb)  03/28/16 79.8 kg (176 lb)  03/14/16 82.1 kg (181 lb)    General:  Appears calm and comfortable; a&Ox3 Eyes:  PERRL, EOMI, normal lids, iris ENT:  grossly normal hearing, lips & tongue Neck:  no LAD, masses or thyromegaly Cardiovascular:  RRR, no m/r/g. No LE edema. Palpable thrill from fistula Respiratory:  Diffuse wheezing, no rales or rhonchi, respiratory distress, mild increased work of breathing Abdomen:  soft, ntnd Skin:  no rash or induration seen on limited exam Musculoskeletal:  grossly normal tone BUE/BLE Psychiatric:  grossly normal mood and affect, speech fluent and appropriate Neurologic:  CN 2-12 grossly intact, moves all extremities in coordinated fashion.          Labs on Admission:  Basic Metabolic Panel:  Recent Labs Lab 08/26/16 0857  NA 134*  K 3.9  CL 93*  CO2 29  GLUCOSE 116*  BUN 16  CREATININE 4.98*  CALCIUM 9.7   Liver Function Tests:  Recent Labs Lab 08/26/16 0857  AST 22  ALT 10*  ALKPHOS 96  BILITOT 0.8  PROT 8.1  ALBUMIN 3.4*   No results for input(s): LIPASE, AMYLASE in the last 168 hours. No results for input(s): AMMONIA in the last 168 hours. CBC:  Recent Labs Lab 08/26/16 0857  WBC 6.1  HGB 12.1  HCT 37.2  MCV 94.4  PLT 155   Cardiac Enzymes: No results for input(s): CKTOTAL, CKMB, CKMBINDEX, TROPONINI in the last 168 hours.  BNP (last 3 results)  Recent Labs  08/26/16 0857  BNP >4,500.0*    ProBNP (last 3 results) No results for input(s): PROBNP in the last 8760 hours.   Creatinine clearance cannot be calculated (Unknown ideal weight.)  CBG: No results for input(s): GLUCAP in the last 168 hours.  Radiological Exams on Admission: Dg Chest Port 1 View  Result Date: 08/26/2016 CLINICAL DATA:  Shortness of Breath EXAM: PORTABLE CHEST 1 VIEW COMPARISON:  08/25/2013 FINDINGS: Cardiomegaly with vascular congestion and  interstitial prominence, likely interstitial edema. No confluent opacities or effusions. No acute bony abnormality. IMPRESSION: Cardiomegaly.  Suspect mild interstitial edema/CHF. Electronically Signed   By: Rolm Baptise M.D.   On: 08/26/2016 10:20    EKG: Independently reviewed. No STEMI. NSR.  Assessment/Plan Principal Problem:   Respiratory distress Active Problems:   Hypertension   Smoker   Gout   End stage renal disease (HCC)   Alcohol abuse   Resp Distress Patient with onset of dyspnea dialysis Chest x-ray today shows pulmonary edema Differential includes ischemic event versus drop in body weight given patient had full treatment has not noticed any  treatments I-STAT troponin the ED negative will recheck Nephrology consult in Patient did dialyze Nitro paste for relief of shortness of breath  Gout Continue allopurinol  Hypertension Continue Tenormin  Hyperlipidemia Continue statin  Tobacco abuse Advised smoking cessation  ETOH abuse CIWA protocol  Code Status: full DVT Prophylaxis: heparin Family Communication: none available Disposition Plan: Pending Improvement  Status: tele inpt  Elwin Mocha, MD Family Medicine [facility] [email or web address] Password [email or web address]

## 2016-08-27 ENCOUNTER — Other Ambulatory Visit (HOSPITAL_COMMUNITY): Payer: Medicare Other

## 2016-08-27 ENCOUNTER — Inpatient Hospital Stay (HOSPITAL_COMMUNITY): Payer: Medicare Other

## 2016-08-27 DIAGNOSIS — J441 Chronic obstructive pulmonary disease with (acute) exacerbation: Principal | ICD-10-CM

## 2016-08-27 DIAGNOSIS — N186 End stage renal disease: Secondary | ICD-10-CM

## 2016-08-27 DIAGNOSIS — F17200 Nicotine dependence, unspecified, uncomplicated: Secondary | ICD-10-CM

## 2016-08-27 DIAGNOSIS — F1010 Alcohol abuse, uncomplicated: Secondary | ICD-10-CM

## 2016-08-27 LAB — BASIC METABOLIC PANEL
ANION GAP: 11 (ref 5–15)
BUN: 13 mg/dL (ref 6–20)
CALCIUM: 9.8 mg/dL (ref 8.9–10.3)
CO2: 29 mmol/L (ref 22–32)
CREATININE: 4.35 mg/dL — AB (ref 0.44–1.00)
Chloride: 93 mmol/L — ABNORMAL LOW (ref 101–111)
GFR, EST AFRICAN AMERICAN: 11 mL/min — AB (ref >60)
GFR, EST NON AFRICAN AMERICAN: 10 mL/min — AB (ref >60)
Glucose, Bld: 95 mg/dL (ref 65–99)
Potassium: 3.3 mmol/L — ABNORMAL LOW (ref 3.5–5.1)
SODIUM: 133 mmol/L — AB (ref 135–145)

## 2016-08-27 LAB — CBC
HCT: 40.6 % (ref 36.0–46.0)
HEMOGLOBIN: 12.9 g/dL (ref 12.0–15.0)
MCH: 30 pg (ref 26.0–34.0)
MCHC: 31.8 g/dL (ref 30.0–36.0)
MCV: 94.4 fL (ref 78.0–100.0)
PLATELETS: 150 10*3/uL (ref 150–400)
RBC: 4.3 MIL/uL (ref 3.87–5.11)
RDW: 17.1 % — ABNORMAL HIGH (ref 11.5–15.5)
WBC: 6.9 10*3/uL (ref 4.0–10.5)

## 2016-08-27 MED ORDER — NEPRO/CARBSTEADY PO LIQD
237.0000 mL | Freq: Two times a day (BID) | ORAL | Status: DC
  Administered 2016-08-27 – 2016-08-29 (×3): 237 mL via ORAL

## 2016-08-27 MED ORDER — VITAMIN B-1 100 MG PO TABS
100.0000 mg | ORAL_TABLET | Freq: Every day | ORAL | Status: DC
  Administered 2016-08-27 – 2016-08-29 (×3): 100 mg via ORAL
  Filled 2016-08-27 (×3): qty 1

## 2016-08-27 MED ORDER — THIAMINE HCL 100 MG/ML IJ SOLN: 100.0000 mg | Freq: Every day | INTRAMUSCULAR | Status: DC

## 2016-08-27 MED ORDER — LORAZEPAM 2 MG/ML IJ SOLN: 1.0000 mg | Freq: Four times a day (QID) | INTRAMUSCULAR | Status: DC | PRN

## 2016-08-27 MED ORDER — LORAZEPAM 1 MG PO TABS: 1.0000 mg | ORAL_TABLET | Freq: Four times a day (QID) | ORAL | Status: DC | PRN

## 2016-08-27 MED ORDER — ADULT MULTIVITAMIN W/MINERALS CH
1.0000 | ORAL_TABLET | Freq: Every day | ORAL | Status: DC
  Administered 2016-08-27: 1 via ORAL
  Filled 2016-08-27: qty 1

## 2016-08-27 MED ORDER — PREDNISONE 20 MG PO TABS
40.0000 mg | ORAL_TABLET | Freq: Every day | ORAL | Status: DC
  Administered 2016-08-29: 40 mg via ORAL
  Filled 2016-08-27: qty 2

## 2016-08-27 MED ORDER — IPRATROPIUM-ALBUTEROL 0.5-2.5 (3) MG/3ML IN SOLN
3.0000 mL | Freq: Four times a day (QID) | RESPIRATORY_TRACT | Status: DC
  Administered 2016-08-27 (×2): 3 mL via RESPIRATORY_TRACT
  Filled 2016-08-27 (×2): qty 3

## 2016-08-27 MED ORDER — VITAMIN B-1 100 MG PO TABS: 100.0000 mg | ORAL_TABLET | Freq: Every day | ORAL | Status: DC

## 2016-08-27 MED ORDER — ALBUTEROL SULFATE (2.5 MG/3ML) 0.083% IN NEBU: 2.5000 mg | INHALATION_SOLUTION | RESPIRATORY_TRACT | Status: DC | PRN

## 2016-08-27 NOTE — Progress Notes (Signed)
[facility] Progress Note   D[place] Orders: [place] MWF[place] AVF  EDW just lowered to 77.5 from 78 attained this wt Friday 2 K 2 Ca profile 4 heparin 4000 with 2K mid tmt Calcitriol 1.25 Mircera 75 q 4 weeks - last dose 7/4 - s/p short course IV Fe - for tsat 26^ hgb 10.8 7/2 iPTH 257  Assessment/Plan: 1. SOB with wheezing/cough - Tmax 99.7 CXR shows pul edema/ hx of smoker- continue lower lower edw - extra treatment 7/14 with net UF 4.2 L -? Exacerbation of COPD - still wheezing though not as badly.  Had repeat CXR this am - showed mild interstitial edema/pul HTN/prob COPD. Discussed with primary. Agree w/ primary MD this is combination of URI/ COPD exac and vol overload. Losing body wt, will cont to lower dry wt as tolerated.  2. ESRD -  MWF - had extra tmt 7/14  due to excess volume- back on schedule Monday; K 3.3 use 4 K bath Monday. Will have a significantly lower EDW for discharge. 3. Hypertension/volume  - titrating volume down - continue meds; BP high at outpatient unit; has had EDW gradually lowered 3 kg from 80.5 May 30th Net UF 7/14 4.2 L with post weight 73.3; BP improving 4. Anemia  - hgb 12.9 - no ESA s/p course of Fe 5. Metabolic bone disease -  Ca 9.8 - - generally on 2 Ca bath but running 2.25 here due to 4 K bath; on velphoro 2 ac 6. Nutrition - unintentional weight loss worrisome given polysubstance hx- have RD at dialysis center follow up with her at discharge 7. Hx alcohol abuse, tobacco abuse + cannibis use- may be contributory to weight loss -   Jessica Jacobson, PA-C [place] [phone] 08/27/2016,8:47 AM  LOS: 1 day   Pt seen, examined, agree w assess/plan as above with additions as indicated.  Kelly Splinter MD [facility] pager [phone]    cell [phone] 08/27/2016, 12:34 PM     Subjective:   Just took her O2 off herself.  Coughing mucous- whitish yellow; hasn't had anything to eat since  admission.  Objective Vitals:   08/26/16 2030 08/26/16 2039 08/26/16 2230 08/27/16 0518  BP: (!) 165/98 132/81 (!) 157/96 122/60  Pulse: 78 80 83 74  Resp: 18 18 18 18   Temp:  97.7 F (36.5 C) 97.8 F (36.6 C) 99.7 F (37.6 C)  TempSrc:  Oral Oral Oral  SpO2: 98% 98% 96% 100%  Weight:  73.3 kg (161 lb 9.6 oz)    Height:       Physical Exam General: loose cough, NAD  Heart: RRR Lungs: bilateral wheezes - not a bad a Saturday Abdomen: soft NT Extremities: no LE edema  Dialysis Access: left lower AVF + bruit   Additional Objective Labs: Basic Metabolic Panel:  Recent Labs Lab 08/26/16 0857 08/27/16 0552  NA 134* 133*  K 3.9 3.3*  CL 93* 93*  CO2 29 29  GLUCOSE 116* 95  BUN 16 13  CREATININE 4.98* 4.35*  CALCIUM 9.7 9.8   Liver Function Tests:  Recent Labs Lab 08/26/16 0857  AST 22  ALT 10*  ALKPHOS 96  BILITOT 0.8  PROT 8.1  ALBUMIN 3.4*   CBC:  Recent Labs Lab 08/26/16 0857 08/27/16 0552  WBC 6.1 6.9  HGB 12.1 12.9  HCT 37.2 40.6  MCV 94.4 94.4  PLT 155 150   Blood Culture    Component Value Date/Time  SDES URINE, CLEAN CATCH 08/27/2013 0546   SPECREQUEST NONE 08/27/2013 0546   CULT  08/27/2013 0546    Multiple bacterial morphotypes present, none predominant. Suggest appropriate recollection if clinically indicated. Performed at [place] 08/28/2013 FINAL 08/27/2013 0546    Cardiac Enzymes:  Recent Labs Lab 08/26/16 1519  TROPONINI 0.09*   CBG: No results for input(s): GLUCAP in the last 168 hours. Iron Studies: No results for input(s): IRON, TIBC, TRANSFERRIN, FERRITIN in the last 72 hours. No results found for: INR, PROTIME Studies/Results: Dg Chest Port 1 View  Result Date: 08/26/2016 CLINICAL DATA:  Shortness of Breath EXAM: PORTABLE CHEST 1 VIEW COMPARISON:  08/25/2013 FINDINGS: Cardiomegaly with vascular congestion and interstitial prominence, likely interstitial edema. No confluent opacities or  effusions. No acute bony abnormality. IMPRESSION: Cardiomegaly.  Suspect mild interstitial edema/CHF. Electronically Signed   By: Rolm Baptise M.D.   On: 08/26/2016 10:20   Medications:  . allopurinol  100 mg Oral Daily  . aspirin EC  325 mg Oral Daily  . atenolol  100 mg Oral Daily  . atorvastatin  10 mg Oral q1800  . [START ON 08/28/2016] calcitRIOL  1.25 mcg Oral Q M,W,F-HD  . heparin  5,000 Units Subcutaneous Q8H  . multivitamin  1 tablet Oral QHS  . nitroGLYCERIN  1 inch Topical Q6H  . sodium chloride flush  3 mL Intravenous Q12H  . sucroferric oxyhydroxide  1,000 mg Oral TID WC

## 2016-08-27 NOTE — Progress Notes (Addendum)
PROGRESS NOTE    Jessica Clarke  MPN:[id number] DOB: 07/12/1950 DOA: 08/26/2016 PCP: Glendale Chard, MD   Outpatient Specialists:     Brief Narrative:  Jessica Clarke is a 66 y.o. female  pmhx significant for anemia, ESRD, hx of MI, GERD presents with SOB. Pt states she has been sob since her last HD on Friday. Patient states she has continued to get placed on nasal cannula oxygen while at dialysis. Felt better, she was sent home. Symptoms did not improve. Denies any chest pain. States this is happened before but not sure how things cleared up last time. Recent medication changes.  ED course: Patient given 2 breathing treatments with some relief but physical exam not improved per EDP. Chest x-ray show pulmonary edema. Hospitalist consulted for admission.   Assessment & Plan:   Principal Problem:   Respiratory distress Active Problems:   Hypertension   Smoker   Gout   End stage renal disease (HCC)   Alcohol abuse   Resp Distress due to COPD exacerbation and fluid overload due ESRD S/p dialysis and removal of 4L Will get dialysis again in the AM per renal -start nebs and prednisone -repeat x ray this AM shows:  Edema/prb COPD/pulm HTN -repeat echo here to see what current EF is  Elevated troponin -suspect demand ischemia  Gout Continue allopurinol  Hypertension Continue Tenormin  Hyperlipidemia Continue statin  Tobacco abuse Advised smoking cessation  ETOH abuse CIWA protocol   DVT prophylaxis:  SQ Heparin  Code Status: Full Code   Family Communication:   Disposition Plan:     Consultants:  renal  Subjective: +cough  Objective: Vitals:   08/26/16 2039 08/26/16 2230 08/27/16 0518 08/27/16 0959  BP: 132/81 (!) 157/96 122/60 135/66  Pulse: 80 83 74 76  Resp: 18 18 18 18   Temp: 97.7 F (36.5 C) 97.8 F (36.6 C) 99.7 F (37.6 C) 98.5 F (36.9 C)  TempSrc: Oral Oral Oral Oral  SpO2: 98% 96% 100% 93%  Weight: 73.3 kg (161 lb 9.6 oz)      Height:        Intake/Output Summary (Last 24 hours) at 08/27/16 1205 Last data filed at 08/27/16 0601  Gross per 24 hour  Intake                0 ml  Output             4200 ml  Net            -4200 ml   Filed Weights   08/26/16 1557 08/26/16 1702 08/26/16 2039  Weight: 77.6 kg (171 lb 1.2 oz) 77.5 kg (170 lb 13.7 oz) 73.3 kg (161 lb 9.6 oz)    Examination:  General exam: in bed, coughing Respiratory system: coarse sounds plus wheezing Cardiovascular system: rrr Gastrointestinal system: Abdomen is nondistended, soft and nontender. No organomegaly or masses felt. Normal bowel sounds heard. Central nervous system: Alert and oriented. No focal neurological deficits. Extremities: Symmetric 5 x 5 power. Skin: No rashes, lesions or ulcers Psychiatry: Judgement and insight appear normal. Mood & affect appropriate.     Data Reviewed: I have personally reviewed following labs and imaging studies  CBC:  Recent Labs Lab 08/26/16 0857 08/27/16 0552  WBC 6.1 6.9  HGB 12.1 12.9  HCT 37.2 40.6  MCV 94.4 94.4  PLT 155 008   Basic Metabolic Panel:  Recent Labs Lab 08/26/16 0857 08/27/16 0552  NA 134* 133*  K 3.9 3.3*  CL 93* 93*  CO2 29 29  GLUCOSE 116* 95  BUN 16 13  CREATININE 4.98* 4.35*  CALCIUM 9.7 9.8   GFR: Estimated Creatinine Clearance: 14 mL/min (A) (by C-G formula based on SCr of 4.35 mg/dL (H)). Liver Function Tests:  Recent Labs Lab 08/26/16 0857  AST 22  ALT 10*  ALKPHOS 96  BILITOT 0.8  PROT 8.1  ALBUMIN 3.4*   No results for input(s): LIPASE, AMYLASE in the last 168 hours. No results for input(s): AMMONIA in the last 168 hours. Coagulation Profile: No results for input(s): INR, PROTIME in the last 168 hours. Cardiac Enzymes:  Recent Labs Lab 08/26/16 1519  TROPONINI 0.09*   BNP (last 3 results) No results for input(s): PROBNP in the last 8760 hours. HbA1C: No results for input(s): HGBA1C in the last 72 hours. CBG: No results  for input(s): GLUCAP in the last 168 hours. Lipid Profile: No results for input(s): CHOL, HDL, LDLCALC, TRIG, CHOLHDL, LDLDIRECT in the last 72 hours. Thyroid Function Tests: No results for input(s): TSH, T4TOTAL, FREET4, T3FREE, THYROIDAB in the last 72 hours. Anemia Panel: No results for input(s): VITAMINB12, FOLATE, FERRITIN, TIBC, IRON, RETICCTPCT in the last 72 hours. Urine analysis:    Component Value Date/Time   COLORURINE YELLOW 08/27/2013 0546   APPEARANCEUR CLOUDY (A) 08/27/2013 0546   LABSPEC 1.026 08/27/2013 0546   PHURINE 7.5 08/27/2013 0546   GLUCOSEU NEGATIVE 08/27/2013 0546   HGBUR TRACE (A) 08/27/2013 0546   BILIRUBINUR SMALL (A) 08/27/2013 0546   KETONESUR NEGATIVE 08/27/2013 0546   PROTEINUR >300 (A) 08/27/2013 0546   UROBILINOGEN 0.2 08/27/2013 0546   NITRITE NEGATIVE 08/27/2013 0546   LEUKOCYTESUR NEGATIVE 08/27/2013 0546     ) Recent Results (from the past 240 hour(s))  MRSA PCR Screening     Status: None   Collection Time: 08/26/16  4:22 PM  Result Value Ref Range Status   MRSA by PCR NEGATIVE NEGATIVE Final    Comment:        The GeneXpert MRSA Assay (FDA approved for NASAL specimens only), is one component of a comprehensive MRSA colonization surveillance program. It is not intended to diagnose MRSA infection nor to guide or monitor treatment for MRSA infections.       Anti-infectives    None       Radiology Studies: Dg Chest Port 1 View  Result Date: 08/27/2016 CLINICAL DATA:  Patient admitted for shortness of breath 08/26/2016. History renal failure. EXAM: PORTABLE CHEST 1 VIEW COMPARISON:  Single-view of the chest 08/26/2016 and 08/25/2013. FINDINGS: There is cardiomegaly and mild interstitial edema. No pneumothorax or pleural effusion. The chest is hyperexpanded. Aortic atherosclerosis is noted. IMPRESSION: Cardiomegaly and mild interstitial edema. Pulmonary hyperexpansion suggests COPD. Electronically Signed   By: Inge Rise  M.D.   On: 08/27/2016 10:08   Dg Chest Port 1 View  Result Date: 08/26/2016 CLINICAL DATA:  Shortness of Breath EXAM: PORTABLE CHEST 1 VIEW COMPARISON:  08/25/2013 FINDINGS: Cardiomegaly with vascular congestion and interstitial prominence, likely interstitial edema. No confluent opacities or effusions. No acute bony abnormality. IMPRESSION: Cardiomegaly.  Suspect mild interstitial edema/CHF. Electronically Signed   By: Rolm Baptise M.D.   On: 08/26/2016 10:20        Scheduled Meds: . allopurinol  100 mg Oral Daily  . aspirin EC  325 mg Oral Daily  . atenolol  100 mg Oral Daily  . atorvastatin  10 mg Oral q1800  . [START ON 08/28/2016] calcitRIOL  1.25 mcg Oral Q M,W,F-HD  .  feeding supplement (NEPRO CARB STEADY)  237 mL Oral BID BM  . heparin  5,000 Units Subcutaneous Q8H  . ipratropium-albuterol  3 mL Nebulization Q6H  . multivitamin  1 tablet Oral QHS  . nitroGLYCERIN  1 inch Topical Q6H  . [START ON 08/28/2016] predniSONE  40 mg Oral Q breakfast  . sodium chloride flush  3 mL Intravenous Q12H  . sucroferric oxyhydroxide  1,000 mg Oral TID WC   Continuous Infusions:   LOS: 1 day    Time spent: 25 min    [place], DO [facility] Pager [phone]  If 7PM-7AM, please contact night-coverage [email or web address] Password [facility] 08/27/2016, 12:05 PM

## 2016-08-28 ENCOUNTER — Inpatient Hospital Stay (HOSPITAL_COMMUNITY): Payer: Medicare Other

## 2016-08-28 DIAGNOSIS — E43 Unspecified severe protein-calorie malnutrition: Secondary | ICD-10-CM | POA: Insufficient documentation

## 2016-08-28 DIAGNOSIS — E440 Moderate protein-calorie malnutrition: Secondary | ICD-10-CM | POA: Insufficient documentation

## 2016-08-28 DIAGNOSIS — J9601 Acute respiratory failure with hypoxia: Secondary | ICD-10-CM

## 2016-08-28 LAB — RENAL FUNCTION PANEL
Albumin: 3 g/dL — ABNORMAL LOW (ref 3.5–5.0)
Anion gap: 11 (ref 5–15)
BUN: 35 mg/dL — ABNORMAL HIGH (ref 6–20)
CO2: 29 mmol/L (ref 22–32)
Calcium: 9.2 mg/dL (ref 8.9–10.3)
Chloride: 89 mmol/L — ABNORMAL LOW (ref 101–111)
Creatinine, Ser: 6.41 mg/dL — ABNORMAL HIGH (ref 0.44–1.00)
GFR calc Af Amer: 7 mL/min — ABNORMAL LOW (ref >60)
GFR calc non Af Amer: 6 mL/min — ABNORMAL LOW (ref >60)
Glucose, Bld: 94 mg/dL (ref 65–99)
Phosphorus: 2.6 mg/dL (ref 2.5–4.6)
Potassium: 3.2 mmol/L — ABNORMAL LOW (ref 3.5–5.1)
Sodium: 129 mmol/L — ABNORMAL LOW (ref 135–145)

## 2016-08-28 LAB — CBC
HCT: 34.7 % — ABNORMAL LOW (ref 36.0–46.0)
Hemoglobin: 11.5 g/dL — ABNORMAL LOW (ref 12.0–15.0)
MCH: 30.7 pg (ref 26.0–34.0)
MCHC: 33.1 g/dL (ref 30.0–36.0)
MCV: 92.5 fL (ref 78.0–100.0)
Platelets: 164 10*3/uL (ref 150–400)
RBC: 3.75 MIL/uL — ABNORMAL LOW (ref 3.87–5.11)
RDW: 16.9 % — ABNORMAL HIGH (ref 11.5–15.5)
WBC: 8 10*3/uL (ref 4.0–10.5)

## 2016-08-28 MED ORDER — CALCITRIOL 0.25 MCG PO CAPS
ORAL_CAPSULE | ORAL | Status: AC
  Filled 2016-08-28: qty 1

## 2016-08-28 MED ORDER — LIDOCAINE-PRILOCAINE 2.5-2.5 % EX CREA: 1.0000 "application " | TOPICAL_CREAM | CUTANEOUS | Status: DC | PRN

## 2016-08-28 MED ORDER — LIDOCAINE HCL (PF) 1 % IJ SOLN: 5.0000 mL | INTRAMUSCULAR | Status: DC | PRN

## 2016-08-28 MED ORDER — SODIUM CHLORIDE 0.9 % IV SOLN: 100.0000 mL | INTRAVENOUS | Status: DC | PRN

## 2016-08-28 MED ORDER — IOPAMIDOL (ISOVUE-300) INJECTION 61%
INTRAVENOUS | Status: AC
  Administered 2016-08-28: 75 mL via INTRAVENOUS
  Filled 2016-08-28: qty 75

## 2016-08-28 MED ORDER — PENTAFLUOROPROP-TETRAFLUOROETH EX AERO: 1.0000 "application " | INHALATION_SPRAY | CUTANEOUS | Status: DC | PRN

## 2016-08-28 MED ORDER — ALTEPLASE 2 MG IJ SOLR: 2.0000 mg | Freq: Once | INTRAMUSCULAR | Status: DC | PRN

## 2016-08-28 MED ORDER — DOXYCYCLINE HYCLATE 100 MG PO TABS
100.0000 mg | ORAL_TABLET | Freq: Two times a day (BID) | ORAL | Status: DC
  Administered 2016-08-28 – 2016-08-29 (×3): 100 mg via ORAL
  Filled 2016-08-28 (×3): qty 1

## 2016-08-28 MED ORDER — HEPARIN SODIUM (PORCINE) 1000 UNIT/ML DIALYSIS: 1000.0000 [IU] | INTRAMUSCULAR | Status: DC | PRN

## 2016-08-28 MED ORDER — CALCITRIOL 0.5 MCG PO CAPS
ORAL_CAPSULE | ORAL | Status: AC
  Filled 2016-08-28: qty 2

## 2016-08-28 MED ORDER — IPRATROPIUM-ALBUTEROL 0.5-2.5 (3) MG/3ML IN SOLN
3.0000 mL | Freq: Three times a day (TID) | RESPIRATORY_TRACT | Status: DC
  Filled 2016-08-28: qty 3

## 2016-08-28 MED ORDER — HEPARIN SODIUM (PORCINE) 1000 UNIT/ML DIALYSIS: 20.0000 [IU]/kg | INTRAMUSCULAR | Status: DC | PRN

## 2016-08-28 NOTE — Progress Notes (Signed)
[facility] Progress Note   Dialysis Orders: [place] MWF left lower AVF  EDW just lowered to 77.5 from 78 attained this wt Friday 2 K 2 Ca profile 4 heparin 4000 with 2K mid tmt Calcitriol 1.25 Mircera 75 q 4 weeks - last dose 7/4 - s/p short course IV Fe - for tsat 26^ hgb 10.8 7/2 iPTH 257  Assessment/Plan: 1. SOB with wheezing/cough - Tmax 99.7 CXR shows pul edema/ hx of smoker- continue lower lower edw - extra treatment 7/14 with net UF 4.2 L -? Exacerbation of COPD - still wheezing though not as badly. Improving with UF.  Suspect she will need a significant EDW adjustment  2. ESRD -  MWF - had extra tmt 7/14  due to excess volume- back on schedule Monday; K 3.3 use 4 K bath Monday. Will have a significantly lower EDW for discharge. 3. Hypertension/volume  - titrating volume down - continue meds; BP high at outpatient unit; has had EDW gradually lowered 3 kg from 80.5 May 30th Net UF 7/14 4.2 L with post weight 73.3; BP improving.  Recommend d/c nitropaste if no CP to help achieve max UF.   4. Anemia  - hgb 12.9 - no ESA s/p course of Fe 5. Metabolic bone disease -  Ca 9.8 - - generally on 2 Ca bath but running 2.25 here due to 4 K bath; on velphoro 2 ac 6. Nutrition - unintentional weight loss worrisome given polysubstance hx- have RD at dialysis center follow up with her at discharge 7. Hx alcohol abuse, tobacco abuse + cannibis use- may be contributory to weight loss -   Madelon Lips MD [place] pgr [phone] 08/28/2016,8:42 AM  LOS: 2 days      Subjective:   Sitting in bed, NAD.  On HD.  Wearing O2  Objective Vitals:   08/28/16 0700 08/28/16 0717 08/28/16 0730 08/28/16 0800  BP: 136/77 124/64 122/68 114/67  Pulse: 69 64 63 (!) 57  Resp: 16     Temp: 98.1 F (36.7 C)     TempSrc: Oral     SpO2: 96%     Weight: 73.3 kg (161 lb 9.6 oz)     Height:       Physical Exam General: sleeping, easily arousable  Heart: RRR, no m/r/g Lungs:  occasional faint rhonchi Abdomen: soft NT Extremities: no LE edema  Dialysis Access: left lower AVF + bruit   Additional Objective Labs: Basic Metabolic Panel:  Recent Labs Lab 08/26/16 0857 08/27/16 0552 08/28/16 0715  NA 134* 133* 129*  K 3.9 3.3* 3.2*  CL 93* 93* 89*  CO2 29 29 29   GLUCOSE 116* 95 94  BUN 16 13 35*  CREATININE 4.98* 4.35* 6.41*  CALCIUM 9.7 9.8 9.2  PHOS  --   --  2.6   Liver Function Tests:  Recent Labs Lab 08/26/16 0857 08/28/16 0715  AST 22  --   ALT 10*  --   ALKPHOS 96  --   BILITOT 0.8  --   PROT 8.1  --   ALBUMIN 3.4* 3.0*   CBC:  Recent Labs Lab 08/26/16 0857 08/27/16 0552 08/28/16 0715  WBC 6.1 6.9 8.0  HGB 12.1 12.9 11.5*  HCT 37.2 40.6 34.7*  MCV 94.4 94.4 92.5  PLT 155 150 164   Blood Culture    Component Value Date/Time   SDES URINE, CLEAN CATCH 08/27/2013 0546   SPECREQUEST NONE 08/27/2013 0546   CULT  08/27/2013 0546  Multiple bacterial morphotypes present, none predominant. Suggest appropriate recollection if clinically indicated. Performed at [place] 08/28/2013 FINAL 08/27/2013 0546    Cardiac Enzymes:  Recent Labs Lab 08/26/16 1519  TROPONINI 0.09*   CBG: No results for input(s): GLUCAP in the last 168 hours. Iron Studies: No results for input(s): IRON, TIBC, TRANSFERRIN, FERRITIN in the last 72 hours. No results found for: INR, PROTIME Studies/Results: Dg Chest Port 1 View  Result Date: 08/27/2016 CLINICAL DATA:  Patient admitted for shortness of breath 08/26/2016. History renal failure. EXAM: PORTABLE CHEST 1 VIEW COMPARISON:  Single-view of the chest 08/26/2016 and 08/25/2013. FINDINGS: There is cardiomegaly and mild interstitial edema. No pneumothorax or pleural effusion. The chest is hyperexpanded. Aortic atherosclerosis is noted. IMPRESSION: Cardiomegaly and mild interstitial edema. Pulmonary hyperexpansion suggests COPD. Electronically Signed   By: Inge Rise M.D.    On: 08/27/2016 10:08   Dg Chest Port 1 View  Result Date: 08/26/2016 CLINICAL DATA:  Shortness of Breath EXAM: PORTABLE CHEST 1 VIEW COMPARISON:  08/25/2013 FINDINGS: Cardiomegaly with vascular congestion and interstitial prominence, likely interstitial edema. No confluent opacities or effusions. No acute bony abnormality. IMPRESSION: Cardiomegaly.  Suspect mild interstitial edema/CHF. Electronically Signed   By: Rolm Baptise M.D.   On: 08/26/2016 10:20   Medications: . sodium chloride    . sodium chloride     . allopurinol  100 mg Oral Daily  . aspirin EC  325 mg Oral Daily  . atenolol  100 mg Oral Daily  . atorvastatin  10 mg Oral q1800  . calcitRIOL  1.25 mcg Oral Q M,W,F-HD  . feeding supplement (NEPRO CARB STEADY)  237 mL Oral BID BM  . heparin  5,000 Units Subcutaneous Q8H  . ipratropium-albuterol  3 mL Nebulization TID  . multivitamin  1 tablet Oral QHS  . multivitamin with minerals  1 tablet Oral Daily  . nitroGLYCERIN  1 inch Topical Q6H  . predniSONE  40 mg Oral Q breakfast  . sodium chloride flush  3 mL Intravenous Q12H  . sucroferric oxyhydroxide  1,000 mg Oral TID WC  . thiamine  100 mg Oral Daily

## 2016-08-28 NOTE — Progress Notes (Signed)
Late Entry: Messaged MD regarding pt losing IV access. Pt does not want another IV access in unless she has to. This nurse was told that the plan was to D/C pt tom. After dialysis. Pt is not taking any IV medications. Awaiting response.   Eleanora Neighbor, RN

## 2016-08-28 NOTE — Procedures (Signed)
Patient seen and examined on Hemodialysis. QB 400, UF goal 2L.  No complaints.  Pre-weight significantly below OP EDW this AM.  UF as tolerated.  Treatment adjusted as needed.  Madelon Lips MD [facility] pgr [phone] 8:45 AM

## 2016-08-28 NOTE — Progress Notes (Addendum)
SATURATION QUALIFICATIONS: (This note is used to comply with regulatory documentation for home oxygen)  Patient Saturations on Room Air at Rest = 90%  Patient Saturations on Room Air while Ambulating = 83%  Patient Saturations on  4 Liters of oxygen while Ambulating = 96%  Please briefly explain why patient needs home oxygen: While ambulating patient's oxygen saturation was 83% room air,patient was placed on 2 liters of oxygen, saturation while on 2 liters ambulating was 85%. Patient was placed on 3 liters of oxygen, saturation while ambulating was 89%. Patient was placed on 4 liters of oxygen,saturation while ambulating was 96%.

## 2016-08-28 NOTE — Progress Notes (Signed)
PROGRESS NOTE    Jessica Clarke  WUJ:[id number] DOB: 1950-08-20 DOA: 08/26/2016 PCP: Glendale Chard, MD   Outpatient Specialists:     Brief Narrative:  Jessica Clarke is a 66 y.o. female  pmhx significant for anemia, ESRD, hx of MI, GERD presents with SOB. Pt states she has been sob since her last HD on Friday. Patient states she has continued to get placed on nasal cannula oxygen while at dialysis. Felt better, she was sent home. Symptoms did not improve. Denies any chest pain. States this is happened before but not sure how things cleared up last time. Recent medication changes.  ED course: Patient given 2 breathing treatments with some relief but physical exam not improved per EDP. Chest x-ray show pulmonary edema. Hospitalist consulted for admission.   Assessment & Plan:   Principal Problem:   Respiratory distress Active Problems:   Hypertension   Smoker   Gout   End stage renal disease ([place])   Alcohol abuse   Malnutrition of moderate degree   Acute Resp failure due to COPD exacerbation and fluid overload due ESRD S/p dialysis and removal of 4L and repeat dialysis -start nebs and prednisone -repeat x ray 7/15 showed:  Edema/prb COPD/pulm HTN -patient desatted to low 80s while abulating so will get CT scan of chest ( weight loss, smoker)  Elevated troponin -suspect demand ischemia  Gout Continue allopurinol  Hypertension Continue Tenormin  Hyperlipidemia Continue statin  Tobacco abuse Advised smoking cessation  ETOH abuse CIWA protocol   DVT prophylaxis:  SQ Heparin  Code Status: Full Code   Family Communication:   Disposition Plan:     Consultants:  renal  Subjective: Less cough and feels better-- still wearing O2  Objective: Vitals:   08/28/16 1030 08/28/16 1100 08/28/16 1119 08/28/16 1524  BP: (!) 90/47 (!) 98/54 (!) 115/57 112/64  Pulse: 67 69 65 72  Resp:   16 15  Temp:   97.7 F (36.5 C) 98.7 F (37.1 C)  TempSrc:   Oral  Oral  SpO2:   96% 90%  Weight:   71.5 kg (157 lb 10.1 oz)   Height:        Intake/Output Summary (Last 24 hours) at 08/28/16 1810 Last data filed at 08/28/16 1400  Gross per 24 hour  Intake              480 ml  Output             1491 ml  Net            -1011 ml   Filed Weights   08/26/16 2039 08/28/16 0700 08/28/16 1119  Weight: 73.3 kg (161 lb 9.6 oz) 73.3 kg (161 lb 9.6 oz) 71.5 kg (157 lb 10.1 oz)    Examination:  General exam: in bed, NAD Respiratory system: few expiratory wheezing, coarse breath sounds Cardiovascular system: rrr Gastrointestinal system: Abdomen is nondistended, soft and nontender. No organomegaly or masses felt. Normal bowel sounds heard. Central nervous system: Alert and oriented. No focal neurological deficits. Extremities: Symmetric 5 x 5 power.      Data Reviewed: I have personally reviewed following labs and imaging studies  CBC:  Recent Labs Lab 08/26/16 0857 08/27/16 0552 08/28/16 0715  WBC 6.1 6.9 8.0  HGB 12.1 12.9 11.5*  HCT 37.2 40.6 34.7*  MCV 94.4 94.4 92.5  PLT 155 150 956   Basic Metabolic Panel:  Recent Labs Lab 08/26/16 0857 08/27/16 0552 08/28/16 0715  NA 134* 133* 129*  K 3.9 3.3* 3.2*  CL 93* 93* 89*  CO2 29 29 29   GLUCOSE 116* 95 94  BUN 16 13 35*  CREATININE 4.98* 4.35* 6.41*  CALCIUM 9.7 9.8 9.2  PHOS  --   --  2.6   GFR: Estimated Creatinine Clearance: 9.5 mL/min (A) (by C-G formula based on SCr of 6.41 mg/dL (H)). Liver Function Tests:  Recent Labs Lab 08/26/16 0857 08/28/16 0715  AST 22  --   ALT 10*  --   ALKPHOS 96  --   BILITOT 0.8  --   PROT 8.1  --   ALBUMIN 3.4* 3.0*   No results for input(s): LIPASE, AMYLASE in the last 168 hours. No results for input(s): AMMONIA in the last 168 hours. Coagulation Profile: No results for input(s): INR, PROTIME in the last 168 hours. Cardiac Enzymes:  Recent Labs Lab 08/26/16 1519  TROPONINI 0.09*   BNP (last 3 results) No results for  input(s): PROBNP in the last 8760 hours. HbA1C: No results for input(s): HGBA1C in the last 72 hours. CBG: No results for input(s): GLUCAP in the last 168 hours. Lipid Profile: No results for input(s): CHOL, HDL, LDLCALC, TRIG, CHOLHDL, LDLDIRECT in the last 72 hours. Thyroid Function Tests: No results for input(s): TSH, T4TOTAL, FREET4, T3FREE, THYROIDAB in the last 72 hours. Anemia Panel: No results for input(s): VITAMINB12, FOLATE, FERRITIN, TIBC, IRON, RETICCTPCT in the last 72 hours. Urine analysis:    Component Value Date/Time   COLORURINE YELLOW 08/27/2013 0546   APPEARANCEUR CLOUDY (A) 08/27/2013 0546   LABSPEC 1.026 08/27/2013 0546   PHURINE 7.5 08/27/2013 0546   GLUCOSEU NEGATIVE 08/27/2013 0546   HGBUR TRACE (A) 08/27/2013 0546   BILIRUBINUR SMALL (A) 08/27/2013 0546   KETONESUR NEGATIVE 08/27/2013 0546   PROTEINUR >300 (A) 08/27/2013 0546   UROBILINOGEN 0.2 08/27/2013 0546   NITRITE NEGATIVE 08/27/2013 0546   LEUKOCYTESUR NEGATIVE 08/27/2013 0546     ) Recent Results (from the past 240 hour(s))  MRSA PCR Screening     Status: None   Collection Time: 08/26/16  4:22 PM  Result Value Ref Range Status   MRSA by PCR NEGATIVE NEGATIVE Final    Comment:        The GeneXpert MRSA Assay (FDA approved for NASAL specimens only), is one component of a comprehensive MRSA colonization surveillance program. It is not intended to diagnose MRSA infection nor to guide or monitor treatment for MRSA infections.       Anti-infectives    Start     Dose/Rate Route Frequency Ordered Stop   08/28/16 1400  doxycycline (VIBRA-TABS) tablet 100 mg     100 mg Oral Every 12 hours 08/28/16 1346         Radiology Studies: Dg Chest Port 1 View  Result Date: 08/27/2016 CLINICAL DATA:  Patient admitted for shortness of breath 08/26/2016. History renal failure. EXAM: PORTABLE CHEST 1 VIEW COMPARISON:  Single-view of the chest 08/26/2016 and 08/25/2013. FINDINGS: There is  cardiomegaly and mild interstitial edema. No pneumothorax or pleural effusion. The chest is hyperexpanded. Aortic atherosclerosis is noted. IMPRESSION: Cardiomegaly and mild interstitial edema. Pulmonary hyperexpansion suggests COPD. Electronically Signed   By: Inge Rise M.D.   On: 08/27/2016 10:08        Scheduled Meds: . allopurinol  100 mg Oral Daily  . aspirin EC  325 mg Oral Daily  . atenolol  100 mg Oral Daily  . atorvastatin  10 mg Oral q1800  . calcitRIOL  1.25  mcg Oral Q M,W,F-HD  . doxycycline  100 mg Oral Q12H  . feeding supplement (NEPRO CARB STEADY)  237 mL Oral BID BM  . heparin  5,000 Units Subcutaneous Q8H  . multivitamin  1 tablet Oral QHS  . predniSONE  40 mg Oral Q breakfast  . sodium chloride flush  3 mL Intravenous Q12H  . sucroferric oxyhydroxide  1,000 mg Oral TID WC  . thiamine  100 mg Oral Daily   Continuous Infusions:   LOS: 2 days    Time spent: 25 min    [place], DO [facility] Pager [phone]  If 7PM-7AM, please contact night-coverage [email or web address] Password [email or web address] 08/28/2016, 6:10 PM

## 2016-08-28 NOTE — Progress Notes (Signed)
Initial Nutrition Assessment  DOCUMENTATION CODES:   Non-severe (moderate) malnutrition in context of chronic illness  INTERVENTION:   -Continue Nepro Shake po BID, each supplement provides 425 kcal and 19 grams protein  -Add small snacks between meals  -Discussed importance of increasing protein intake via protein bars, alternative nutritional supplements. Also reviewed sources of protein  NUTRITION DIAGNOSIS:   Malnutrition (Moderate) related to chronic illness (ESRD on HD, COPD) as evidenced by mild depletion of body fat, mild depletion of muscle mass.  GOAL:   Patient will meet greater than or equal to 90% of their needs  MONITOR:   PO intake, Supplement acceptance, Labs, Weight trends  REASON FOR ASSESSMENT:   Malnutrition Screening Tool    ASSESSMENT:   66 yo female admitted with SOB due to COPD excerbation and fluid overload. Pt with hx of ESRD on HD, CHF, CKD, GERD, HL, HTN, MI  Recorded po intake 25-50% of meals. Pt reports appetite has not been good. Pt missed breakfast this AM due to dialysis and then did not want much of her lunch as she was still mad from missing dialysis. At home, some days pt reports all she does is drink water and maybe a sod, but no solid foods. Daughter at bedside and indicates that pt eats corn starch on a regular basis (pt acknowledges this as well). Reviewed how eating non-nutritive items like corn starch can be filling and take the place of foods that can nourish the body (may be attributing to pt's reported wt loss) and  certain non-nutritive items can have negative affects on health. Pt reports she does not eat a lot of protein foods, pt reports she takes a bite of meat and then does not have a taste for it anymore.  Pt reports she drinks a Nepro shake at dialysis but does not drink any supplements at home.   Pt is anuric; 1/5 L fluid removed with HD today. Noted pre-weight siginificantly below outpatient EDW with plans for ED adjustment  at discharge. Weight down 14 pounds since admission. Net negative 5 L (5 kg/11 pounds)  Pt reports she cannot believe how skinny her legs are. Appears she has had significant LE edema for a while now; family indicates that pt's legs looked like "elephant legs" for a while now.   Nutrition-Focused physical exam completed. Findings are mild/moderate fat depletion, mild/moderate muscle depletion, and no edema.   Labs: sodium 129, potassium 3.2, no phosphorus Meds: rena-vit and MVI/minerals, thiamine, velphoro, prednisone  Diet Order:  Diet renal with fluid restriction Fluid restriction: 1200 mL Fluid; Room service appropriate? Yes; Fluid consistency: Thin  Skin:  Reviewed, no issues  Last BM:  08/25/16  Height:   Ht Readings from Last 1 Encounters:  08/26/16 5\' 11"  (1.803 m)    Weight:   Wt Readings from Last 1 Encounters:  08/28/16 157 lb 10.1 oz (71.5 kg)    Ideal Body Weight:     BMI:  Body mass index is 21.98 kg/m.  Estimated Nutritional Needs:   Kcal:  2000-2450 kcals  Protein:  100-120 g  Fluid:  1000 mL plus UOP  EDUCATION NEEDS:   No education needs identified at this time  [place], [place], LDN [phone] Pager  [phone] Weekend/On-Call Pager

## 2016-08-29 DIAGNOSIS — E440 Moderate protein-calorie malnutrition: Secondary | ICD-10-CM

## 2016-08-29 MED ORDER — DOXYCYCLINE HYCLATE 100 MG PO TABS: 100.0000 mg | ORAL_TABLET | Freq: Two times a day (BID) | ORAL | 0 refills | Status: DC

## 2016-08-29 MED ORDER — PREDNISONE 20 MG PO TABS
40.0000 mg | ORAL_TABLET | Freq: Every day | ORAL | 0 refills | Status: AC
Start: 2016-08-30 — End: 2016-09-03

## 2016-08-29 MED ORDER — NEPRO/CARBSTEADY PO LIQD: 237.0000 mL | Freq: Two times a day (BID) | ORAL | 0 refills | Status: DC

## 2016-08-29 NOTE — Care Management Note (Addendum)
Case Management Note  Patient Details  Name: Jessica Clarke MRN: [id number] Date of Birth: 07-30-1950  Subjective/Objective:              Cm following for progression and d/c planning.       Action/Plan: 08/29/2016 Pt for d/c to home today, [facility] to provide Home oxygen, sats recorded and [facility] notified of plan to d/c today. HHRN also ordered and request sent to [facility].   Expected Discharge Date:  08/29/16               Expected Discharge Plan:  Home/Self Care  In-House Referral:  NA  Discharge planning Services  CM Consult  Post Acute Care Choice:  Durable Medical Equipment Choice offered to:  Patient  DME Arranged:  Oxygen DME Agency:  [place]:  NA [place] Agency:  NA  Status of Service:  Completed, signed off  If discussed at [place] of Stay Meetings, dates discussed:    Additional Comments:  Adron Bene, RN 08/29/2016, 10:29 AM

## 2016-08-29 NOTE — Discharge Summary (Signed)
Physician Discharge Summary  Jessica Clarke ION:[id number] DOB: 18-May-1950 DOA: 08/26/2016  PCP: Dorothyann Peng, MD  Admit date: 08/26/2016 Discharge date: 08/29/2016   Recommendations for Outpatient Follow-Up:   1. Dry weight for dialysis much lower than previous 2. Wean O2- home health RN 3. D/c'd on PO abx, if not improving at next dialysis, may need to be changed to IV abx with dialysis 4. Smoking cessation   Discharge Diagnosis:   Principal Problem:   Respiratory distress Active Problems:   Hypertension   Smoker   Gout   End stage renal disease ([facility])   Alcohol abuse   Malnutrition of moderate degree   Discharge disposition:  Home  Discharge Condition: Improved.  Diet recommendation: renal  Wound care: None.   History of Present Illness:   Jessica Clarke is a 66 y.o. female  pmhx significant for anemia, ESRD, hx of MI, GERD presents with SOB. Pt states she has been sob since her last HD on Friday. Patient states she has continued to get placed on nasal cannula oxygen while at dialysis. Felt better, she was sent home. Symptoms did not improve. Denies any chest pain. States this is happened before but not sure how things cleared up last time. Recent medication changes.  ED course: Patient given 2 breathing treatments with some relief but physical exam not improved per EDP. Chest x-ray show pulmonary edema. Hospitalist consulted for admission.   Hospital Course by Problem:   Acute Resp failure due to COPD exacerbation and fluid overload due ESRD Improved some with dialysis - prednisone -repeat x ray 7/15 showed:  Edema/prb COPD/pulm HTN -CT scan: Areas of patchy pneumonia in the right upper and both lower lobes. No appreciable pulmonary edema.  Elevated troponin -suspect demand ischemia  Gout Continue allopurinol  Hypertension Continue Tenormin  Hyperlipidemia Continue statin  Tobacco abuse Advised smoking  cessation  ETOHabuse CIWAprotocol -encourage cessation    Medical Consultants:    renal   Discharge Exam:   Vitals:   08/28/16 2159 08/29/16 0430  BP: (!) 100/27 114/86  Pulse: 67 76  Resp: 16 16  Temp: 98.1 F (36.7 C) 99 F (37.2 C)   Vitals:   08/28/16 1119 08/28/16 1524 08/28/16 2159 08/29/16 0430  BP: (!) 115/57 112/64 (!) 100/27 114/86  Pulse: 65 72 67 76  Resp: 16 15 16 16   Temp: 97.7 F (36.5 C) 98.7 F (37.1 C) 98.1 F (36.7 C) 99 F (37.2 C)  TempSrc: Oral Oral Oral Oral  SpO2: 96% 90% 95% 91%  Weight: 71.5 kg (157 lb 10.1 oz)  71.4 kg (157 lb 6.5 oz)   Height:        Gen:  NAD    The results of significant diagnostics from this hospitalization (including imaging, microbiology, ancillary and laboratory) are listed below for reference.     Procedures and Diagnostic Studies:   Dg Chest Port 1 View  Result Date: 08/27/2016 CLINICAL DATA:  Patient admitted for shortness of breath 08/26/2016. History renal failure. EXAM: PORTABLE CHEST 1 VIEW COMPARISON:  Single-view of the chest 08/26/2016 and 08/25/2013. FINDINGS: There is cardiomegaly and mild interstitial edema. No pneumothorax or pleural effusion. The chest is hyperexpanded. Aortic atherosclerosis is noted. IMPRESSION: Cardiomegaly and mild interstitial edema. Pulmonary hyperexpansion suggests COPD. Electronically Signed   By: Drusilla Kanner M.D.   On: 08/27/2016 10:08   Dg Chest Port 1 View  Result Date: 08/26/2016 CLINICAL DATA:  Shortness of Breath EXAM: PORTABLE CHEST 1 VIEW COMPARISON:  08/25/2013 FINDINGS:  Cardiomegaly with vascular congestion and interstitial prominence, likely interstitial edema. No confluent opacities or effusions. No acute bony abnormality. IMPRESSION: Cardiomegaly.  Suspect mild interstitial edema/CHF. Electronically Signed   By: Charlett Nose M.D.   On: 08/26/2016 10:20     Labs:   Basic Metabolic Panel:  Recent Labs Lab 08/26/16 0857 08/27/16 0552  08/28/16 0715  NA 134* 133* 129*  K 3.9 3.3* 3.2*  CL 93* 93* 89*  CO2 29 29 29   GLUCOSE 116* 95 94  BUN 16 13 35*  CREATININE 4.98* 4.35* 6.41*  CALCIUM 9.7 9.8 9.2  PHOS  --   --  2.6   GFR Estimated Creatinine Clearance: 9.5 mL/min (A) (by C-G formula based on SCr of 6.41 mg/dL (H)). Liver Function Tests:  Recent Labs Lab 08/26/16 0857 08/28/16 0715  AST 22  --   ALT 10*  --   ALKPHOS 96  --   BILITOT 0.8  --   PROT 8.1  --   ALBUMIN 3.4* 3.0*   No results for input(s): LIPASE, AMYLASE in the last 168 hours. No results for input(s): AMMONIA in the last 168 hours. Coagulation profile No results for input(s): INR, PROTIME in the last 168 hours.  CBC:  Recent Labs Lab 08/26/16 0857 08/27/16 0552 08/28/16 0715  WBC 6.1 6.9 8.0  HGB 12.1 12.9 11.5*  HCT 37.2 40.6 34.7*  MCV 94.4 94.4 92.5  PLT 155 150 164   Cardiac Enzymes:  Recent Labs Lab 08/26/16 1519  TROPONINI 0.09*   BNP: Invalid input(s): POCBNP CBG: No results for input(s): GLUCAP in the last 168 hours. D-Dimer No results for input(s): DDIMER in the last 72 hours. Hgb A1c No results for input(s): HGBA1C in the last 72 hours. Lipid Profile No results for input(s): CHOL, HDL, LDLCALC, TRIG, CHOLHDL, LDLDIRECT in the last 72 hours. Thyroid function studies No results for input(s): TSH, T4TOTAL, T3FREE, THYROIDAB in the last 72 hours.  Invalid input(s): FREET3 Anemia work up No results for input(s): VITAMINB12, FOLATE, FERRITIN, TIBC, IRON, RETICCTPCT in the last 72 hours. Microbiology Recent Results (from the past 240 hour(s))  MRSA PCR Screening     Status: None   Collection Time: 08/26/16  4:22 PM  Result Value Ref Range Status   MRSA by PCR NEGATIVE NEGATIVE Final    Comment:        The GeneXpert MRSA Assay (FDA approved for NASAL specimens only), is one component of a comprehensive MRSA colonization surveillance program. It is not intended to diagnose MRSA infection nor to guide  or monitor treatment for MRSA infections.      Discharge Instructions:   Discharge Instructions    Discharge instructions    Complete by:  As directed    Resume HD Stop smoking Home O2 and home health RN Renal diet with fluid restrictions   Increase activity slowly    Complete by:  As directed      Allergies as of 08/29/2016      Reactions   No Known Allergies       Medication List    STOP taking these medications   oxyCODONE-acetaminophen 5-325 MG tablet Commonly known as:  PERCOCET/ROXICET     TAKE these medications   allopurinol 100 MG tablet Commonly known as:  ZYLOPRIM Take 100 mg by mouth daily.   aspirin EC 325 MG tablet Take 1 tablet (325 mg total) by mouth daily.   atenolol 100 MG tablet Commonly known as:  TENORMIN Take 1 tablet (100  mg total) by mouth daily.   atorvastatin 10 MG tablet Commonly known as:  LIPITOR   doxycycline 100 MG tablet Commonly known as:  VIBRA-TABS Take 1 tablet (100 mg total) by mouth every 12 (twelve) hours.   feeding supplement (NEPRO CARB STEADY) Liqd Take 237 mLs by mouth 2 (two) times daily between meals.   multivitamin with minerals Tabs tablet Take 1 tablet by mouth daily.   predniSONE 20 MG tablet Commonly known as:  DELTASONE Take 2 tablets (40 mg total) by mouth daily with breakfast.   VELPHORO 500 MG chewable tablet Generic drug:  sucroferric oxyhydroxide Chew 2 tablets by mouth 3 (three) times daily.            Durable Medical Equipment        Start     Ordered   08/28/16 1810  For home use only DME oxygen  Once    Question Answer Comment  Mode or (Route) Nasal cannula   Liters per Minute 4   Frequency Continuous (stationary and portable oxygen unit needed)   Oxygen delivery system Gas      08/28/16 1810     Follow-up Information    Dorothyann Peng, MD Follow up in 1 week(s).   Specialty:  Internal Medicine Contact information: [address] [place]  [address] [phone]            Time coordinating discharge: 35 min  Signed:  Prestina Raigoza Juanetta Gosling   [facility] 08/29/2016, 10:06 AM

## 2016-08-29 NOTE — Progress Notes (Signed)
Patient discharged with instructions given on medications,and follow up visits,patient verbalized understanding. Patient  was discharged home on oxygen 4 liters nasal cannula. IV discontinued,catheter intact. [facility] to follow up with patient. Staff accompanied patient to an awaiting vehicle.

## 2016-08-29 NOTE — Progress Notes (Signed)
Jessica Clarke Progress Note   Dialysis Orders: [place] MWF left lower AVF  EDW just lowered to 77.5 from 78 attained this wt Friday 2 K 2 Ca profile 4 heparin 4000 with 2K mid tmt Calcitriol 1.25 Mircera 75 q 4 weeks - last dose 7/4 - s/p short course IV Fe - for tsat 26^ hgb 10.8 7/2 iPTH 257  Assessment/Plan: 1. SOB with wheezing/cough - Tmax 99.7 CXR shows pul edema/ hx of smoker- continue lower edw -  Exacerbation of COPD - still wheezing though not as badly. Improving with UF.  Suspect she will need a significant EDW adjustment Chest CT 7/16 with areas of patchy PNA bilat /No pulm edema. On PO doxy Will need home O2  2. ESRD -  MWF - had extra tmt 7/14  due to excess volume- back on schedule Monday.  Will have a significantly lower EDW for discharge. 3. Hypertension/volume  - BP improving with volume down significantly. Net UF 1416mL with post HD wt 71.5kg on 7/16  titrating volume down - continue meds; BP high at outpatient unit; has had EDW gradually lowered     Recommend d/c nitropaste if no CP to help achieve max UF.   4. Anemia  - hgb 11.5 - no ESA s/p course of Fe 5. Metabolic bone disease -  Ca 9.8 - - generally on 2 Ca bath but running 2.25 here due to 4 K bath; on velphoro 2 ac 6. Nutrition - unintentional weight loss worrisome given polysubstance hx- have RD at dialysis center follow up with her at discharge 7. Hx alcohol abuse, tobacco abuse + cannibis use- may be contributory to weight loss -   Lynnda Child PA-C [facility] Pager [phone] 08/29/2016,9:23 AM     Subjective:    Sitting in bed, wearing oxygen.   HD yesterday with some improvement in breathing.  Desatted while ambulating yesterday Had Chest CT done showing PNA Ready to go home. D/C today on PO abx   Objective Vitals:   08/28/16 1119 08/28/16 1524 08/28/16 2159 08/29/16 0430  BP: (!) 115/57 112/64 (!) 100/27 114/86  Pulse: 65 72 67 76  Resp: 16 15 16 16   Temp: 97.7 F  (36.5 C) 98.7 F (37.1 C) 98.1 F (36.7 C) 99 F (37.2 C)  TempSrc: Oral Oral Oral Oral  SpO2: 96% 90% 95% 91%  Weight: 71.5 kg (157 lb 10.1 oz)  71.4 kg (157 lb 6.5 oz)   Height:       Physical Exam General: WNWD AAF NAD  Heart: RRR, no m/r/g Lungs: restricted air movement rhonchi Abdomen: soft NT Extremities: no LE edema  Dialysis Access: left lower AVF + bruit   Additional Objective Labs: Basic Metabolic Panel:  Recent Labs Lab 08/26/16 0857 08/27/16 0552 08/28/16 0715  NA 134* 133* 129*  K 3.9 3.3* 3.2*  CL 93* 93* 89*  CO2 29 29 29   GLUCOSE 116* 95 94  BUN 16 13 35*  CREATININE 4.98* 4.35* 6.41*  CALCIUM 9.7 9.8 9.2  PHOS  --   --  2.6   Liver Function Tests:  Recent Labs Lab 08/26/16 0857 08/28/16 0715  AST 22  --   ALT 10*  --   ALKPHOS 96  --   BILITOT 0.8  --   PROT 8.1  --   ALBUMIN 3.4* 3.0*   CBC:  Recent Labs Lab 08/26/16 0857 08/27/16 0552 08/28/16 0715  WBC 6.1 6.9 8.0  HGB 12.1 12.9 11.5*  HCT 37.2 40.6 34.7*  MCV 94.4 94.4 92.5  PLT 155 150 164   Blood Culture    Component Value Date/Time   SDES URINE, CLEAN CATCH 08/27/2013 0546   SPECREQUEST NONE 08/27/2013 0546   CULT  08/27/2013 0546    Multiple bacterial morphotypes present, none predominant. Suggest appropriate recollection if clinically indicated. Performed at [place] 08/28/2013 FINAL 08/27/2013 0546    Cardiac Enzymes:  Recent Labs Lab 08/26/16 1519  TROPONINI 0.09*   CBG: No results for input(s): GLUCAP in the last 168 hours. Iron Studies: No results for input(s): IRON, TIBC, TRANSFERRIN, FERRITIN in the last 72 hours. No results found for: INR, PROTIME Studies/Results: Ct Chest W Contrast  Result Date: 08/29/2016 CLINICAL DATA:  Shortness of breath. End-stage renal disease. Anemia. EXAM: CT CHEST WITH CONTRAST TECHNIQUE: Multidetector CT imaging of the chest was performed during intravenous contrast administration. CONTRAST:   37mL ISOVUE-300 IOPAMIDOL (ISOVUE-300) INJECTION 61% COMPARISON:  Chest radiograph August 27, 2016 FINDINGS: Cardiovascular: There is no appreciable thoracic aortic aneurysm or dissection. There is mild atherosclerotic calcification in the proximal left subclavian artery. Other visualized great vessels appear normal. There is calcification in the thoracic aorta as well as multiple foci of calcification and coronary arteries. Pericardium is not appreciably thickened. There is no major vessel pulmonary embolus. Mediastinum/Nodes: Visualized thyroid appears normal. There are scattered subcentimeter mediastinal lymph nodes. There is a right hilar lymph node measuring 1.3 x 1.0 cm. Lungs/Pleura: There is bullous disease in the apices bilaterally. There is tree on bud type appearance in the right upper lobe. There are patchy areas of airspace disease in both lower lobes with tree on bud type appearance in several segments in the right lower lobe. There is no appreciable pleural effusion or pleural thickening. There is mild lower lobe bronchiectatic change. Upper Abdomen: In the visualized upper abdomen, there is atherosclerotic calcification in the aorta. Kidneys are small consistent with known chronic renal failure. Musculoskeletal: Bones show diffuse sclerosis consistent with chronic renal failure. No lytic or destructive bone lesions are evident. IMPRESSION: 1. Areas of patchy pneumonia in the right upper and both lower lobes. No appreciable pulmonary edema. 2.  There is lower lobe bronchiectatic change bilaterally. 3. Borderline prominent right hilar lymph node. Several subcentimeter lymph nodes also noted. 4. Aortic and great vessel atherosclerotic calcification. There are foci of coronary artery calcification. 5. 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 Aortic Atherosclerosis (ICD10-I70.0). Electronically Signed   By: Lowella Grip III M.D.   On:  08/29/2016 07:08   Dg Chest Port 1 View  Result Date: 08/27/2016 CLINICAL DATA:  Patient admitted for shortness of breath 08/26/2016. History renal failure. EXAM: PORTABLE CHEST 1 VIEW COMPARISON:  Single-view of the chest 08/26/2016 and 08/25/2013. FINDINGS: There is cardiomegaly and mild interstitial edema. No pneumothorax or pleural effusion. The chest is hyperexpanded. Aortic atherosclerosis is noted. IMPRESSION: Cardiomegaly and mild interstitial edema. Pulmonary hyperexpansion suggests COPD. Electronically Signed   By: Inge Rise M.D.   On: 08/27/2016 10:08   Medications:  . allopurinol  100 mg Oral Daily  . aspirin EC  325 mg Oral Daily  . atenolol  100 mg Oral Daily  . atorvastatin  10 mg Oral q1800  . calcitRIOL  1.25 mcg Oral Q M,W,F-HD  . doxycycline  100 mg Oral Q12H  . feeding supplement (NEPRO CARB STEADY)  237 mL Oral BID BM  . heparin  5,000 Units Subcutaneous Q8H  .  multivitamin  1 tablet Oral QHS  . predniSONE  40 mg Oral Q breakfast  . sodium chloride flush  3 mL Intravenous Q12H  . sucroferric oxyhydroxide  1,000 mg Oral TID WC  . thiamine  100 mg Oral Daily

## 2016-08-30 DIAGNOSIS — D631 Anemia in chronic kidney disease: Secondary | ICD-10-CM | POA: Diagnosis not present

## 2016-08-30 DIAGNOSIS — D509 Iron deficiency anemia, unspecified: Secondary | ICD-10-CM | POA: Diagnosis not present

## 2016-08-30 DIAGNOSIS — N2581 Secondary hyperparathyroidism of renal origin: Secondary | ICD-10-CM | POA: Diagnosis not present

## 2016-08-30 DIAGNOSIS — N186 End stage renal disease: Secondary | ICD-10-CM | POA: Diagnosis not present

## 2016-09-01 DIAGNOSIS — N186 End stage renal disease: Secondary | ICD-10-CM | POA: Diagnosis not present

## 2016-09-01 DIAGNOSIS — D509 Iron deficiency anemia, unspecified: Secondary | ICD-10-CM | POA: Diagnosis not present

## 2016-09-01 DIAGNOSIS — N2581 Secondary hyperparathyroidism of renal origin: Secondary | ICD-10-CM | POA: Diagnosis not present

## 2016-09-01 DIAGNOSIS — D631 Anemia in chronic kidney disease: Secondary | ICD-10-CM | POA: Diagnosis not present

## 2016-09-04 DIAGNOSIS — D631 Anemia in chronic kidney disease: Secondary | ICD-10-CM | POA: Diagnosis not present

## 2016-09-04 DIAGNOSIS — N2581 Secondary hyperparathyroidism of renal origin: Secondary | ICD-10-CM | POA: Diagnosis not present

## 2016-09-04 DIAGNOSIS — N186 End stage renal disease: Secondary | ICD-10-CM | POA: Diagnosis not present

## 2016-09-04 DIAGNOSIS — D509 Iron deficiency anemia, unspecified: Secondary | ICD-10-CM | POA: Diagnosis not present

## 2016-09-06 DIAGNOSIS — N186 End stage renal disease: Secondary | ICD-10-CM | POA: Diagnosis not present

## 2016-09-06 DIAGNOSIS — D631 Anemia in chronic kidney disease: Secondary | ICD-10-CM | POA: Diagnosis not present

## 2016-09-06 DIAGNOSIS — N2581 Secondary hyperparathyroidism of renal origin: Secondary | ICD-10-CM | POA: Diagnosis not present

## 2016-09-06 DIAGNOSIS — D509 Iron deficiency anemia, unspecified: Secondary | ICD-10-CM | POA: Diagnosis not present

## 2016-09-08 DIAGNOSIS — N186 End stage renal disease: Secondary | ICD-10-CM | POA: Diagnosis not present

## 2016-09-08 DIAGNOSIS — N2581 Secondary hyperparathyroidism of renal origin: Secondary | ICD-10-CM | POA: Diagnosis not present

## 2016-09-08 DIAGNOSIS — D631 Anemia in chronic kidney disease: Secondary | ICD-10-CM | POA: Diagnosis not present

## 2016-09-08 DIAGNOSIS — D509 Iron deficiency anemia, unspecified: Secondary | ICD-10-CM | POA: Diagnosis not present

## 2016-09-12 DIAGNOSIS — N2889 Other specified disorders of kidney and ureter: Secondary | ICD-10-CM | POA: Diagnosis not present

## 2016-09-12 DIAGNOSIS — N186 End stage renal disease: Secondary | ICD-10-CM | POA: Diagnosis not present

## 2016-09-12 DIAGNOSIS — Z992 Dependence on renal dialysis: Secondary | ICD-10-CM | POA: Diagnosis not present

## 2016-09-13 DIAGNOSIS — Z23 Encounter for immunization: Secondary | ICD-10-CM | POA: Diagnosis not present

## 2016-09-13 DIAGNOSIS — N186 End stage renal disease: Secondary | ICD-10-CM | POA: Diagnosis not present

## 2016-09-13 DIAGNOSIS — D631 Anemia in chronic kidney disease: Secondary | ICD-10-CM | POA: Diagnosis not present

## 2016-09-13 DIAGNOSIS — N2581 Secondary hyperparathyroidism of renal origin: Secondary | ICD-10-CM | POA: Diagnosis not present

## 2016-09-14 DIAGNOSIS — E785 Hyperlipidemia, unspecified: Secondary | ICD-10-CM | POA: Diagnosis not present

## 2016-09-15 DIAGNOSIS — D631 Anemia in chronic kidney disease: Secondary | ICD-10-CM | POA: Diagnosis not present

## 2016-09-15 DIAGNOSIS — N2581 Secondary hyperparathyroidism of renal origin: Secondary | ICD-10-CM | POA: Diagnosis not present

## 2016-09-15 DIAGNOSIS — N186 End stage renal disease: Secondary | ICD-10-CM | POA: Diagnosis not present

## 2016-09-15 DIAGNOSIS — Z23 Encounter for immunization: Secondary | ICD-10-CM | POA: Diagnosis not present

## 2016-09-18 DIAGNOSIS — D631 Anemia in chronic kidney disease: Secondary | ICD-10-CM | POA: Diagnosis not present

## 2016-09-18 DIAGNOSIS — N2581 Secondary hyperparathyroidism of renal origin: Secondary | ICD-10-CM | POA: Diagnosis not present

## 2016-09-18 DIAGNOSIS — N186 End stage renal disease: Secondary | ICD-10-CM | POA: Diagnosis not present

## 2016-09-18 DIAGNOSIS — Z23 Encounter for immunization: Secondary | ICD-10-CM | POA: Diagnosis not present

## 2016-09-20 DIAGNOSIS — D631 Anemia in chronic kidney disease: Secondary | ICD-10-CM | POA: Diagnosis not present

## 2016-09-20 DIAGNOSIS — N186 End stage renal disease: Secondary | ICD-10-CM | POA: Diagnosis not present

## 2016-09-20 DIAGNOSIS — N2581 Secondary hyperparathyroidism of renal origin: Secondary | ICD-10-CM | POA: Diagnosis not present

## 2016-09-20 DIAGNOSIS — Z23 Encounter for immunization: Secondary | ICD-10-CM | POA: Diagnosis not present

## 2016-09-22 DIAGNOSIS — N186 End stage renal disease: Secondary | ICD-10-CM | POA: Diagnosis not present

## 2016-09-22 DIAGNOSIS — Z23 Encounter for immunization: Secondary | ICD-10-CM | POA: Diagnosis not present

## 2016-09-22 DIAGNOSIS — N2581 Secondary hyperparathyroidism of renal origin: Secondary | ICD-10-CM | POA: Diagnosis not present

## 2016-09-22 DIAGNOSIS — D631 Anemia in chronic kidney disease: Secondary | ICD-10-CM | POA: Diagnosis not present

## 2016-09-25 DIAGNOSIS — D631 Anemia in chronic kidney disease: Secondary | ICD-10-CM | POA: Diagnosis not present

## 2016-09-25 DIAGNOSIS — Z23 Encounter for immunization: Secondary | ICD-10-CM | POA: Diagnosis not present

## 2016-09-25 DIAGNOSIS — N2581 Secondary hyperparathyroidism of renal origin: Secondary | ICD-10-CM | POA: Diagnosis not present

## 2016-09-25 DIAGNOSIS — N186 End stage renal disease: Secondary | ICD-10-CM | POA: Diagnosis not present

## 2016-09-27 DIAGNOSIS — N186 End stage renal disease: Secondary | ICD-10-CM | POA: Diagnosis not present

## 2016-09-27 DIAGNOSIS — N2581 Secondary hyperparathyroidism of renal origin: Secondary | ICD-10-CM | POA: Diagnosis not present

## 2016-09-27 DIAGNOSIS — D631 Anemia in chronic kidney disease: Secondary | ICD-10-CM | POA: Diagnosis not present

## 2016-09-27 DIAGNOSIS — Z23 Encounter for immunization: Secondary | ICD-10-CM | POA: Diagnosis not present

## 2016-09-29 DIAGNOSIS — D631 Anemia in chronic kidney disease: Secondary | ICD-10-CM | POA: Diagnosis not present

## 2016-09-29 DIAGNOSIS — N186 End stage renal disease: Secondary | ICD-10-CM | POA: Diagnosis not present

## 2016-09-29 DIAGNOSIS — N2581 Secondary hyperparathyroidism of renal origin: Secondary | ICD-10-CM | POA: Diagnosis not present

## 2016-09-29 DIAGNOSIS — Z23 Encounter for immunization: Secondary | ICD-10-CM | POA: Diagnosis not present

## 2016-10-02 DIAGNOSIS — D631 Anemia in chronic kidney disease: Secondary | ICD-10-CM | POA: Diagnosis not present

## 2016-10-02 DIAGNOSIS — Z23 Encounter for immunization: Secondary | ICD-10-CM | POA: Diagnosis not present

## 2016-10-02 DIAGNOSIS — N186 End stage renal disease: Secondary | ICD-10-CM | POA: Diagnosis not present

## 2016-10-02 DIAGNOSIS — N2581 Secondary hyperparathyroidism of renal origin: Secondary | ICD-10-CM | POA: Diagnosis not present

## 2016-10-04 DIAGNOSIS — N2581 Secondary hyperparathyroidism of renal origin: Secondary | ICD-10-CM | POA: Diagnosis not present

## 2016-10-04 DIAGNOSIS — N186 End stage renal disease: Secondary | ICD-10-CM | POA: Diagnosis not present

## 2016-10-04 DIAGNOSIS — Z23 Encounter for immunization: Secondary | ICD-10-CM | POA: Diagnosis not present

## 2016-10-04 DIAGNOSIS — D631 Anemia in chronic kidney disease: Secondary | ICD-10-CM | POA: Diagnosis not present

## 2016-10-06 DIAGNOSIS — N186 End stage renal disease: Secondary | ICD-10-CM | POA: Diagnosis not present

## 2016-10-06 DIAGNOSIS — D631 Anemia in chronic kidney disease: Secondary | ICD-10-CM | POA: Diagnosis not present

## 2016-10-06 DIAGNOSIS — Z23 Encounter for immunization: Secondary | ICD-10-CM | POA: Diagnosis not present

## 2016-10-06 DIAGNOSIS — N2581 Secondary hyperparathyroidism of renal origin: Secondary | ICD-10-CM | POA: Diagnosis not present

## 2016-10-09 DIAGNOSIS — N2581 Secondary hyperparathyroidism of renal origin: Secondary | ICD-10-CM | POA: Diagnosis not present

## 2016-10-09 DIAGNOSIS — N186 End stage renal disease: Secondary | ICD-10-CM | POA: Diagnosis not present

## 2016-10-09 DIAGNOSIS — Z23 Encounter for immunization: Secondary | ICD-10-CM | POA: Diagnosis not present

## 2016-10-09 DIAGNOSIS — D631 Anemia in chronic kidney disease: Secondary | ICD-10-CM | POA: Diagnosis not present

## 2016-10-10 ENCOUNTER — Ambulatory Visit
Admission: RE | Admit: 2016-10-10 | Discharge: 2016-10-10 | Disposition: A | Discharge status: Home / Self Care | Payer: Medicare Other | Source: Ambulatory Visit | Attending: Nurse Practitioner | Admitting: Nurse Practitioner

## 2016-10-10 ENCOUNTER — Other Ambulatory Visit: Payer: Self-pay | Admitting: Nurse Practitioner

## 2016-10-10 DIAGNOSIS — J189 Pneumonia, unspecified organism: Secondary | ICD-10-CM

## 2016-10-11 DIAGNOSIS — Z23 Encounter for immunization: Secondary | ICD-10-CM | POA: Diagnosis not present

## 2016-10-11 DIAGNOSIS — N2581 Secondary hyperparathyroidism of renal origin: Secondary | ICD-10-CM | POA: Diagnosis not present

## 2016-10-11 DIAGNOSIS — D631 Anemia in chronic kidney disease: Secondary | ICD-10-CM | POA: Diagnosis not present

## 2016-10-11 DIAGNOSIS — N186 End stage renal disease: Secondary | ICD-10-CM | POA: Diagnosis not present

## 2016-10-13 DIAGNOSIS — Z992 Dependence on renal dialysis: Secondary | ICD-10-CM | POA: Diagnosis not present

## 2016-10-13 DIAGNOSIS — N2889 Other specified disorders of kidney and ureter: Secondary | ICD-10-CM | POA: Diagnosis not present

## 2016-10-13 DIAGNOSIS — Z23 Encounter for immunization: Secondary | ICD-10-CM | POA: Diagnosis not present

## 2016-10-13 DIAGNOSIS — D631 Anemia in chronic kidney disease: Secondary | ICD-10-CM | POA: Diagnosis not present

## 2016-10-13 DIAGNOSIS — N186 End stage renal disease: Secondary | ICD-10-CM | POA: Diagnosis not present

## 2016-10-13 DIAGNOSIS — N2581 Secondary hyperparathyroidism of renal origin: Secondary | ICD-10-CM | POA: Diagnosis not present

## 2016-10-16 DIAGNOSIS — D631 Anemia in chronic kidney disease: Secondary | ICD-10-CM | POA: Diagnosis not present

## 2016-10-16 DIAGNOSIS — N2581 Secondary hyperparathyroidism of renal origin: Secondary | ICD-10-CM | POA: Diagnosis not present

## 2016-10-16 DIAGNOSIS — N186 End stage renal disease: Secondary | ICD-10-CM | POA: Diagnosis not present

## 2016-10-18 DIAGNOSIS — N186 End stage renal disease: Secondary | ICD-10-CM | POA: Diagnosis not present

## 2016-10-18 DIAGNOSIS — D631 Anemia in chronic kidney disease: Secondary | ICD-10-CM | POA: Diagnosis not present

## 2016-10-18 DIAGNOSIS — N2581 Secondary hyperparathyroidism of renal origin: Secondary | ICD-10-CM | POA: Diagnosis not present

## 2016-10-20 DIAGNOSIS — N186 End stage renal disease: Secondary | ICD-10-CM | POA: Diagnosis not present

## 2016-10-20 DIAGNOSIS — D631 Anemia in chronic kidney disease: Secondary | ICD-10-CM | POA: Diagnosis not present

## 2016-10-20 DIAGNOSIS — N2581 Secondary hyperparathyroidism of renal origin: Secondary | ICD-10-CM | POA: Diagnosis not present

## 2016-10-23 DIAGNOSIS — N186 End stage renal disease: Secondary | ICD-10-CM | POA: Diagnosis not present

## 2016-10-23 DIAGNOSIS — N2581 Secondary hyperparathyroidism of renal origin: Secondary | ICD-10-CM | POA: Diagnosis not present

## 2016-10-23 DIAGNOSIS — D631 Anemia in chronic kidney disease: Secondary | ICD-10-CM | POA: Diagnosis not present

## 2016-10-25 DIAGNOSIS — N186 End stage renal disease: Secondary | ICD-10-CM | POA: Diagnosis not present

## 2016-10-25 DIAGNOSIS — N2581 Secondary hyperparathyroidism of renal origin: Secondary | ICD-10-CM | POA: Diagnosis not present

## 2016-10-25 DIAGNOSIS — D631 Anemia in chronic kidney disease: Secondary | ICD-10-CM | POA: Diagnosis not present

## 2016-10-27 DIAGNOSIS — N186 End stage renal disease: Secondary | ICD-10-CM | POA: Diagnosis not present

## 2016-10-27 DIAGNOSIS — N2581 Secondary hyperparathyroidism of renal origin: Secondary | ICD-10-CM | POA: Diagnosis not present

## 2016-10-27 DIAGNOSIS — D631 Anemia in chronic kidney disease: Secondary | ICD-10-CM | POA: Diagnosis not present

## 2016-10-30 DIAGNOSIS — D631 Anemia in chronic kidney disease: Secondary | ICD-10-CM | POA: Diagnosis not present

## 2016-10-30 DIAGNOSIS — N186 End stage renal disease: Secondary | ICD-10-CM | POA: Diagnosis not present

## 2016-10-30 DIAGNOSIS — N2581 Secondary hyperparathyroidism of renal origin: Secondary | ICD-10-CM | POA: Diagnosis not present

## 2016-11-01 DIAGNOSIS — N2581 Secondary hyperparathyroidism of renal origin: Secondary | ICD-10-CM | POA: Diagnosis not present

## 2016-11-01 DIAGNOSIS — D631 Anemia in chronic kidney disease: Secondary | ICD-10-CM | POA: Diagnosis not present

## 2016-11-01 DIAGNOSIS — N186 End stage renal disease: Secondary | ICD-10-CM | POA: Diagnosis not present

## 2016-11-03 DIAGNOSIS — N2581 Secondary hyperparathyroidism of renal origin: Secondary | ICD-10-CM | POA: Diagnosis not present

## 2016-11-03 DIAGNOSIS — D631 Anemia in chronic kidney disease: Secondary | ICD-10-CM | POA: Diagnosis not present

## 2016-11-03 DIAGNOSIS — N186 End stage renal disease: Secondary | ICD-10-CM | POA: Diagnosis not present

## 2016-11-06 DIAGNOSIS — N2581 Secondary hyperparathyroidism of renal origin: Secondary | ICD-10-CM | POA: Diagnosis not present

## 2016-11-06 DIAGNOSIS — N186 End stage renal disease: Secondary | ICD-10-CM | POA: Diagnosis not present

## 2016-11-06 DIAGNOSIS — D631 Anemia in chronic kidney disease: Secondary | ICD-10-CM | POA: Diagnosis not present

## 2016-11-08 DIAGNOSIS — N2581 Secondary hyperparathyroidism of renal origin: Secondary | ICD-10-CM | POA: Diagnosis not present

## 2016-11-08 DIAGNOSIS — N186 End stage renal disease: Secondary | ICD-10-CM | POA: Diagnosis not present

## 2016-11-08 DIAGNOSIS — D631 Anemia in chronic kidney disease: Secondary | ICD-10-CM | POA: Diagnosis not present

## 2016-11-10 DIAGNOSIS — D631 Anemia in chronic kidney disease: Secondary | ICD-10-CM | POA: Diagnosis not present

## 2016-11-10 DIAGNOSIS — N2581 Secondary hyperparathyroidism of renal origin: Secondary | ICD-10-CM | POA: Diagnosis not present

## 2016-11-10 DIAGNOSIS — N186 End stage renal disease: Secondary | ICD-10-CM | POA: Diagnosis not present

## 2016-11-12 DIAGNOSIS — Z992 Dependence on renal dialysis: Secondary | ICD-10-CM | POA: Diagnosis not present

## 2016-11-12 DIAGNOSIS — N2889 Other specified disorders of kidney and ureter: Secondary | ICD-10-CM | POA: Diagnosis not present

## 2016-11-12 DIAGNOSIS — N186 End stage renal disease: Secondary | ICD-10-CM | POA: Diagnosis not present

## 2016-11-13 DIAGNOSIS — Z23 Encounter for immunization: Secondary | ICD-10-CM | POA: Diagnosis not present

## 2016-11-13 DIAGNOSIS — N186 End stage renal disease: Secondary | ICD-10-CM | POA: Diagnosis not present

## 2016-11-13 DIAGNOSIS — D631 Anemia in chronic kidney disease: Secondary | ICD-10-CM | POA: Diagnosis not present

## 2016-11-13 DIAGNOSIS — N2581 Secondary hyperparathyroidism of renal origin: Secondary | ICD-10-CM | POA: Diagnosis not present

## 2016-11-13 DIAGNOSIS — D509 Iron deficiency anemia, unspecified: Secondary | ICD-10-CM | POA: Diagnosis not present

## 2016-11-15 DIAGNOSIS — N186 End stage renal disease: Secondary | ICD-10-CM | POA: Diagnosis not present

## 2016-11-15 DIAGNOSIS — D631 Anemia in chronic kidney disease: Secondary | ICD-10-CM | POA: Diagnosis not present

## 2016-11-15 DIAGNOSIS — Z23 Encounter for immunization: Secondary | ICD-10-CM | POA: Diagnosis not present

## 2016-11-15 DIAGNOSIS — D509 Iron deficiency anemia, unspecified: Secondary | ICD-10-CM | POA: Diagnosis not present

## 2016-11-15 DIAGNOSIS — N2581 Secondary hyperparathyroidism of renal origin: Secondary | ICD-10-CM | POA: Diagnosis not present

## 2016-11-17 DIAGNOSIS — Z23 Encounter for immunization: Secondary | ICD-10-CM | POA: Diagnosis not present

## 2016-11-17 DIAGNOSIS — N2581 Secondary hyperparathyroidism of renal origin: Secondary | ICD-10-CM | POA: Diagnosis not present

## 2016-11-17 DIAGNOSIS — D631 Anemia in chronic kidney disease: Secondary | ICD-10-CM | POA: Diagnosis not present

## 2016-11-17 DIAGNOSIS — N186 End stage renal disease: Secondary | ICD-10-CM | POA: Diagnosis not present

## 2016-11-17 DIAGNOSIS — D509 Iron deficiency anemia, unspecified: Secondary | ICD-10-CM | POA: Diagnosis not present

## 2016-11-20 DIAGNOSIS — D631 Anemia in chronic kidney disease: Secondary | ICD-10-CM | POA: Diagnosis not present

## 2016-11-20 DIAGNOSIS — N2581 Secondary hyperparathyroidism of renal origin: Secondary | ICD-10-CM | POA: Diagnosis not present

## 2016-11-20 DIAGNOSIS — Z23 Encounter for immunization: Secondary | ICD-10-CM | POA: Diagnosis not present

## 2016-11-20 DIAGNOSIS — N186 End stage renal disease: Secondary | ICD-10-CM | POA: Diagnosis not present

## 2016-11-20 DIAGNOSIS — D509 Iron deficiency anemia, unspecified: Secondary | ICD-10-CM | POA: Diagnosis not present

## 2016-11-22 DIAGNOSIS — Z23 Encounter for immunization: Secondary | ICD-10-CM | POA: Diagnosis not present

## 2016-11-22 DIAGNOSIS — D509 Iron deficiency anemia, unspecified: Secondary | ICD-10-CM | POA: Diagnosis not present

## 2016-11-22 DIAGNOSIS — D631 Anemia in chronic kidney disease: Secondary | ICD-10-CM | POA: Diagnosis not present

## 2016-11-22 DIAGNOSIS — N2581 Secondary hyperparathyroidism of renal origin: Secondary | ICD-10-CM | POA: Diagnosis not present

## 2016-11-22 DIAGNOSIS — N186 End stage renal disease: Secondary | ICD-10-CM | POA: Diagnosis not present

## 2016-11-27 DIAGNOSIS — D509 Iron deficiency anemia, unspecified: Secondary | ICD-10-CM | POA: Diagnosis not present

## 2016-11-27 DIAGNOSIS — N186 End stage renal disease: Secondary | ICD-10-CM | POA: Diagnosis not present

## 2016-11-27 DIAGNOSIS — N2581 Secondary hyperparathyroidism of renal origin: Secondary | ICD-10-CM | POA: Diagnosis not present

## 2016-11-27 DIAGNOSIS — Z23 Encounter for immunization: Secondary | ICD-10-CM | POA: Diagnosis not present

## 2016-11-27 DIAGNOSIS — D631 Anemia in chronic kidney disease: Secondary | ICD-10-CM | POA: Diagnosis not present

## 2016-11-29 DIAGNOSIS — D631 Anemia in chronic kidney disease: Secondary | ICD-10-CM | POA: Diagnosis not present

## 2016-11-29 DIAGNOSIS — N186 End stage renal disease: Secondary | ICD-10-CM | POA: Diagnosis not present

## 2016-11-29 DIAGNOSIS — N2581 Secondary hyperparathyroidism of renal origin: Secondary | ICD-10-CM | POA: Diagnosis not present

## 2016-11-29 DIAGNOSIS — D509 Iron deficiency anemia, unspecified: Secondary | ICD-10-CM | POA: Diagnosis not present

## 2016-11-29 DIAGNOSIS — Z23 Encounter for immunization: Secondary | ICD-10-CM | POA: Diagnosis not present

## 2016-12-01 DIAGNOSIS — Z23 Encounter for immunization: Secondary | ICD-10-CM | POA: Diagnosis not present

## 2016-12-01 DIAGNOSIS — D631 Anemia in chronic kidney disease: Secondary | ICD-10-CM | POA: Diagnosis not present

## 2016-12-01 DIAGNOSIS — D509 Iron deficiency anemia, unspecified: Secondary | ICD-10-CM | POA: Diagnosis not present

## 2016-12-01 DIAGNOSIS — N186 End stage renal disease: Secondary | ICD-10-CM | POA: Diagnosis not present

## 2016-12-01 DIAGNOSIS — N2581 Secondary hyperparathyroidism of renal origin: Secondary | ICD-10-CM | POA: Diagnosis not present

## 2016-12-04 DIAGNOSIS — N2581 Secondary hyperparathyroidism of renal origin: Secondary | ICD-10-CM | POA: Diagnosis not present

## 2016-12-04 DIAGNOSIS — D509 Iron deficiency anemia, unspecified: Secondary | ICD-10-CM | POA: Diagnosis not present

## 2016-12-04 DIAGNOSIS — N186 End stage renal disease: Secondary | ICD-10-CM | POA: Diagnosis not present

## 2016-12-04 DIAGNOSIS — D631 Anemia in chronic kidney disease: Secondary | ICD-10-CM | POA: Diagnosis not present

## 2016-12-04 DIAGNOSIS — Z23 Encounter for immunization: Secondary | ICD-10-CM | POA: Diagnosis not present

## 2016-12-06 DIAGNOSIS — D631 Anemia in chronic kidney disease: Secondary | ICD-10-CM | POA: Diagnosis not present

## 2016-12-06 DIAGNOSIS — D509 Iron deficiency anemia, unspecified: Secondary | ICD-10-CM | POA: Diagnosis not present

## 2016-12-06 DIAGNOSIS — N186 End stage renal disease: Secondary | ICD-10-CM | POA: Diagnosis not present

## 2016-12-06 DIAGNOSIS — Z23 Encounter for immunization: Secondary | ICD-10-CM | POA: Diagnosis not present

## 2016-12-06 DIAGNOSIS — N2581 Secondary hyperparathyroidism of renal origin: Secondary | ICD-10-CM | POA: Diagnosis not present

## 2016-12-08 DIAGNOSIS — D509 Iron deficiency anemia, unspecified: Secondary | ICD-10-CM | POA: Diagnosis not present

## 2016-12-08 DIAGNOSIS — Z23 Encounter for immunization: Secondary | ICD-10-CM | POA: Diagnosis not present

## 2016-12-08 DIAGNOSIS — D631 Anemia in chronic kidney disease: Secondary | ICD-10-CM | POA: Diagnosis not present

## 2016-12-08 DIAGNOSIS — N186 End stage renal disease: Secondary | ICD-10-CM | POA: Diagnosis not present

## 2016-12-08 DIAGNOSIS — N2581 Secondary hyperparathyroidism of renal origin: Secondary | ICD-10-CM | POA: Diagnosis not present

## 2016-12-11 DIAGNOSIS — D509 Iron deficiency anemia, unspecified: Secondary | ICD-10-CM | POA: Diagnosis not present

## 2016-12-11 DIAGNOSIS — N186 End stage renal disease: Secondary | ICD-10-CM | POA: Diagnosis not present

## 2016-12-11 DIAGNOSIS — N2581 Secondary hyperparathyroidism of renal origin: Secondary | ICD-10-CM | POA: Diagnosis not present

## 2016-12-11 DIAGNOSIS — D631 Anemia in chronic kidney disease: Secondary | ICD-10-CM | POA: Diagnosis not present

## 2016-12-11 DIAGNOSIS — Z23 Encounter for immunization: Secondary | ICD-10-CM | POA: Diagnosis not present

## 2016-12-13 DIAGNOSIS — Z992 Dependence on renal dialysis: Secondary | ICD-10-CM | POA: Diagnosis not present

## 2016-12-13 DIAGNOSIS — Z23 Encounter for immunization: Secondary | ICD-10-CM | POA: Diagnosis not present

## 2016-12-13 DIAGNOSIS — D509 Iron deficiency anemia, unspecified: Secondary | ICD-10-CM | POA: Diagnosis not present

## 2016-12-13 DIAGNOSIS — N2581 Secondary hyperparathyroidism of renal origin: Secondary | ICD-10-CM | POA: Diagnosis not present

## 2016-12-13 DIAGNOSIS — N2889 Other specified disorders of kidney and ureter: Secondary | ICD-10-CM | POA: Diagnosis not present

## 2016-12-13 DIAGNOSIS — D631 Anemia in chronic kidney disease: Secondary | ICD-10-CM | POA: Diagnosis not present

## 2016-12-13 DIAGNOSIS — N186 End stage renal disease: Secondary | ICD-10-CM | POA: Diagnosis not present

## 2016-12-15 DIAGNOSIS — N2581 Secondary hyperparathyroidism of renal origin: Secondary | ICD-10-CM | POA: Diagnosis not present

## 2016-12-15 DIAGNOSIS — N186 End stage renal disease: Secondary | ICD-10-CM | POA: Diagnosis not present

## 2016-12-15 DIAGNOSIS — D509 Iron deficiency anemia, unspecified: Secondary | ICD-10-CM | POA: Diagnosis not present

## 2016-12-15 DIAGNOSIS — D631 Anemia in chronic kidney disease: Secondary | ICD-10-CM | POA: Diagnosis not present

## 2016-12-18 DIAGNOSIS — D631 Anemia in chronic kidney disease: Secondary | ICD-10-CM | POA: Diagnosis not present

## 2016-12-18 DIAGNOSIS — D509 Iron deficiency anemia, unspecified: Secondary | ICD-10-CM | POA: Diagnosis not present

## 2016-12-18 DIAGNOSIS — N2581 Secondary hyperparathyroidism of renal origin: Secondary | ICD-10-CM | POA: Diagnosis not present

## 2016-12-18 DIAGNOSIS — N186 End stage renal disease: Secondary | ICD-10-CM | POA: Diagnosis not present

## 2016-12-20 DIAGNOSIS — N186 End stage renal disease: Secondary | ICD-10-CM | POA: Diagnosis not present

## 2016-12-20 DIAGNOSIS — D509 Iron deficiency anemia, unspecified: Secondary | ICD-10-CM | POA: Diagnosis not present

## 2016-12-20 DIAGNOSIS — N2581 Secondary hyperparathyroidism of renal origin: Secondary | ICD-10-CM | POA: Diagnosis not present

## 2016-12-20 DIAGNOSIS — D631 Anemia in chronic kidney disease: Secondary | ICD-10-CM | POA: Diagnosis not present

## 2016-12-22 DIAGNOSIS — D509 Iron deficiency anemia, unspecified: Secondary | ICD-10-CM | POA: Diagnosis not present

## 2016-12-22 DIAGNOSIS — N2581 Secondary hyperparathyroidism of renal origin: Secondary | ICD-10-CM | POA: Diagnosis not present

## 2016-12-22 DIAGNOSIS — D631 Anemia in chronic kidney disease: Secondary | ICD-10-CM | POA: Diagnosis not present

## 2016-12-22 DIAGNOSIS — N186 End stage renal disease: Secondary | ICD-10-CM | POA: Diagnosis not present

## 2016-12-25 DIAGNOSIS — D509 Iron deficiency anemia, unspecified: Secondary | ICD-10-CM | POA: Diagnosis not present

## 2016-12-25 DIAGNOSIS — N2581 Secondary hyperparathyroidism of renal origin: Secondary | ICD-10-CM | POA: Diagnosis not present

## 2016-12-25 DIAGNOSIS — D631 Anemia in chronic kidney disease: Secondary | ICD-10-CM | POA: Diagnosis not present

## 2016-12-25 DIAGNOSIS — N186 End stage renal disease: Secondary | ICD-10-CM | POA: Diagnosis not present

## 2016-12-27 DIAGNOSIS — N186 End stage renal disease: Secondary | ICD-10-CM | POA: Diagnosis not present

## 2016-12-27 DIAGNOSIS — D631 Anemia in chronic kidney disease: Secondary | ICD-10-CM | POA: Diagnosis not present

## 2016-12-27 DIAGNOSIS — D509 Iron deficiency anemia, unspecified: Secondary | ICD-10-CM | POA: Diagnosis not present

## 2016-12-27 DIAGNOSIS — N2581 Secondary hyperparathyroidism of renal origin: Secondary | ICD-10-CM | POA: Diagnosis not present

## 2016-12-29 DIAGNOSIS — D631 Anemia in chronic kidney disease: Secondary | ICD-10-CM | POA: Diagnosis not present

## 2016-12-29 DIAGNOSIS — D509 Iron deficiency anemia, unspecified: Secondary | ICD-10-CM | POA: Diagnosis not present

## 2016-12-29 DIAGNOSIS — N186 End stage renal disease: Secondary | ICD-10-CM | POA: Diagnosis not present

## 2016-12-29 DIAGNOSIS — N2581 Secondary hyperparathyroidism of renal origin: Secondary | ICD-10-CM | POA: Diagnosis not present

## 2016-12-31 DIAGNOSIS — N2581 Secondary hyperparathyroidism of renal origin: Secondary | ICD-10-CM | POA: Diagnosis not present

## 2016-12-31 DIAGNOSIS — D631 Anemia in chronic kidney disease: Secondary | ICD-10-CM | POA: Diagnosis not present

## 2016-12-31 DIAGNOSIS — D509 Iron deficiency anemia, unspecified: Secondary | ICD-10-CM | POA: Diagnosis not present

## 2016-12-31 DIAGNOSIS — N186 End stage renal disease: Secondary | ICD-10-CM | POA: Diagnosis not present

## 2017-01-02 DIAGNOSIS — N186 End stage renal disease: Secondary | ICD-10-CM | POA: Diagnosis not present

## 2017-01-02 DIAGNOSIS — D631 Anemia in chronic kidney disease: Secondary | ICD-10-CM | POA: Diagnosis not present

## 2017-01-02 DIAGNOSIS — D509 Iron deficiency anemia, unspecified: Secondary | ICD-10-CM | POA: Diagnosis not present

## 2017-01-02 DIAGNOSIS — N2581 Secondary hyperparathyroidism of renal origin: Secondary | ICD-10-CM | POA: Diagnosis not present

## 2017-01-05 DIAGNOSIS — D631 Anemia in chronic kidney disease: Secondary | ICD-10-CM | POA: Diagnosis not present

## 2017-01-05 DIAGNOSIS — D509 Iron deficiency anemia, unspecified: Secondary | ICD-10-CM | POA: Diagnosis not present

## 2017-01-05 DIAGNOSIS — N2581 Secondary hyperparathyroidism of renal origin: Secondary | ICD-10-CM | POA: Diagnosis not present

## 2017-01-05 DIAGNOSIS — N186 End stage renal disease: Secondary | ICD-10-CM | POA: Diagnosis not present

## 2017-01-08 DIAGNOSIS — N186 End stage renal disease: Secondary | ICD-10-CM | POA: Diagnosis not present

## 2017-01-08 DIAGNOSIS — N2581 Secondary hyperparathyroidism of renal origin: Secondary | ICD-10-CM | POA: Diagnosis not present

## 2017-01-08 DIAGNOSIS — D509 Iron deficiency anemia, unspecified: Secondary | ICD-10-CM | POA: Diagnosis not present

## 2017-01-08 DIAGNOSIS — D631 Anemia in chronic kidney disease: Secondary | ICD-10-CM | POA: Diagnosis not present

## 2017-01-10 DIAGNOSIS — N2581 Secondary hyperparathyroidism of renal origin: Secondary | ICD-10-CM | POA: Diagnosis not present

## 2017-01-10 DIAGNOSIS — D631 Anemia in chronic kidney disease: Secondary | ICD-10-CM | POA: Diagnosis not present

## 2017-01-10 DIAGNOSIS — D509 Iron deficiency anemia, unspecified: Secondary | ICD-10-CM | POA: Diagnosis not present

## 2017-01-10 DIAGNOSIS — N186 End stage renal disease: Secondary | ICD-10-CM | POA: Diagnosis not present

## 2017-01-12 DIAGNOSIS — N2889 Other specified disorders of kidney and ureter: Secondary | ICD-10-CM | POA: Diagnosis not present

## 2017-01-12 DIAGNOSIS — D509 Iron deficiency anemia, unspecified: Secondary | ICD-10-CM | POA: Diagnosis not present

## 2017-01-12 DIAGNOSIS — D631 Anemia in chronic kidney disease: Secondary | ICD-10-CM | POA: Diagnosis not present

## 2017-01-12 DIAGNOSIS — N186 End stage renal disease: Secondary | ICD-10-CM | POA: Diagnosis not present

## 2017-01-12 DIAGNOSIS — Z992 Dependence on renal dialysis: Secondary | ICD-10-CM | POA: Diagnosis not present

## 2017-01-12 DIAGNOSIS — N2581 Secondary hyperparathyroidism of renal origin: Secondary | ICD-10-CM | POA: Diagnosis not present

## 2017-01-17 DIAGNOSIS — N186 End stage renal disease: Secondary | ICD-10-CM | POA: Diagnosis not present

## 2017-01-17 DIAGNOSIS — N2581 Secondary hyperparathyroidism of renal origin: Secondary | ICD-10-CM | POA: Diagnosis not present

## 2017-01-17 DIAGNOSIS — D631 Anemia in chronic kidney disease: Secondary | ICD-10-CM | POA: Diagnosis not present

## 2017-01-17 DIAGNOSIS — D509 Iron deficiency anemia, unspecified: Secondary | ICD-10-CM | POA: Diagnosis not present

## 2017-01-19 DIAGNOSIS — D509 Iron deficiency anemia, unspecified: Secondary | ICD-10-CM | POA: Diagnosis not present

## 2017-01-19 DIAGNOSIS — N186 End stage renal disease: Secondary | ICD-10-CM | POA: Diagnosis not present

## 2017-01-19 DIAGNOSIS — N2581 Secondary hyperparathyroidism of renal origin: Secondary | ICD-10-CM | POA: Diagnosis not present

## 2017-01-19 DIAGNOSIS — D631 Anemia in chronic kidney disease: Secondary | ICD-10-CM | POA: Diagnosis not present

## 2017-01-24 DIAGNOSIS — N186 End stage renal disease: Secondary | ICD-10-CM | POA: Diagnosis not present

## 2017-01-24 DIAGNOSIS — D631 Anemia in chronic kidney disease: Secondary | ICD-10-CM | POA: Diagnosis not present

## 2017-01-24 DIAGNOSIS — D509 Iron deficiency anemia, unspecified: Secondary | ICD-10-CM | POA: Diagnosis not present

## 2017-01-24 DIAGNOSIS — N2581 Secondary hyperparathyroidism of renal origin: Secondary | ICD-10-CM | POA: Diagnosis not present

## 2017-01-26 DIAGNOSIS — D509 Iron deficiency anemia, unspecified: Secondary | ICD-10-CM | POA: Diagnosis not present

## 2017-01-26 DIAGNOSIS — N2581 Secondary hyperparathyroidism of renal origin: Secondary | ICD-10-CM | POA: Diagnosis not present

## 2017-01-26 DIAGNOSIS — N186 End stage renal disease: Secondary | ICD-10-CM | POA: Diagnosis not present

## 2017-01-26 DIAGNOSIS — D631 Anemia in chronic kidney disease: Secondary | ICD-10-CM | POA: Diagnosis not present

## 2017-01-29 DIAGNOSIS — N186 End stage renal disease: Secondary | ICD-10-CM | POA: Diagnosis not present

## 2017-01-29 DIAGNOSIS — D509 Iron deficiency anemia, unspecified: Secondary | ICD-10-CM | POA: Diagnosis not present

## 2017-01-29 DIAGNOSIS — D631 Anemia in chronic kidney disease: Secondary | ICD-10-CM | POA: Diagnosis not present

## 2017-01-29 DIAGNOSIS — N2581 Secondary hyperparathyroidism of renal origin: Secondary | ICD-10-CM | POA: Diagnosis not present

## 2017-01-31 DIAGNOSIS — N2581 Secondary hyperparathyroidism of renal origin: Secondary | ICD-10-CM | POA: Diagnosis not present

## 2017-01-31 DIAGNOSIS — N186 End stage renal disease: Secondary | ICD-10-CM | POA: Diagnosis not present

## 2017-01-31 DIAGNOSIS — D509 Iron deficiency anemia, unspecified: Secondary | ICD-10-CM | POA: Diagnosis not present

## 2017-01-31 DIAGNOSIS — D631 Anemia in chronic kidney disease: Secondary | ICD-10-CM | POA: Diagnosis not present

## 2017-02-04 DIAGNOSIS — N186 End stage renal disease: Secondary | ICD-10-CM | POA: Diagnosis not present

## 2017-02-04 DIAGNOSIS — N2581 Secondary hyperparathyroidism of renal origin: Secondary | ICD-10-CM | POA: Diagnosis not present

## 2017-02-04 DIAGNOSIS — D631 Anemia in chronic kidney disease: Secondary | ICD-10-CM | POA: Diagnosis not present

## 2017-02-04 DIAGNOSIS — D509 Iron deficiency anemia, unspecified: Secondary | ICD-10-CM | POA: Diagnosis not present

## 2017-02-07 DIAGNOSIS — D509 Iron deficiency anemia, unspecified: Secondary | ICD-10-CM | POA: Diagnosis not present

## 2017-02-07 DIAGNOSIS — N186 End stage renal disease: Secondary | ICD-10-CM | POA: Diagnosis not present

## 2017-02-07 DIAGNOSIS — D631 Anemia in chronic kidney disease: Secondary | ICD-10-CM | POA: Diagnosis not present

## 2017-02-07 DIAGNOSIS — N2581 Secondary hyperparathyroidism of renal origin: Secondary | ICD-10-CM | POA: Diagnosis not present

## 2017-02-09 DIAGNOSIS — N186 End stage renal disease: Secondary | ICD-10-CM | POA: Diagnosis not present

## 2017-02-09 DIAGNOSIS — D631 Anemia in chronic kidney disease: Secondary | ICD-10-CM | POA: Diagnosis not present

## 2017-02-09 DIAGNOSIS — N2581 Secondary hyperparathyroidism of renal origin: Secondary | ICD-10-CM | POA: Diagnosis not present

## 2017-02-09 DIAGNOSIS — D509 Iron deficiency anemia, unspecified: Secondary | ICD-10-CM | POA: Diagnosis not present

## 2017-02-11 DIAGNOSIS — D509 Iron deficiency anemia, unspecified: Secondary | ICD-10-CM | POA: Diagnosis not present

## 2017-02-11 DIAGNOSIS — N2581 Secondary hyperparathyroidism of renal origin: Secondary | ICD-10-CM | POA: Diagnosis not present

## 2017-02-11 DIAGNOSIS — N186 End stage renal disease: Secondary | ICD-10-CM | POA: Diagnosis not present

## 2017-02-11 DIAGNOSIS — D631 Anemia in chronic kidney disease: Secondary | ICD-10-CM | POA: Diagnosis not present

## 2017-02-12 DIAGNOSIS — N186 End stage renal disease: Secondary | ICD-10-CM | POA: Diagnosis not present

## 2017-02-12 DIAGNOSIS — N2889 Other specified disorders of kidney and ureter: Secondary | ICD-10-CM | POA: Diagnosis not present

## 2017-02-12 DIAGNOSIS — Z992 Dependence on renal dialysis: Secondary | ICD-10-CM | POA: Diagnosis not present

## 2017-02-14 DIAGNOSIS — N186 End stage renal disease: Secondary | ICD-10-CM | POA: Diagnosis not present

## 2017-02-14 DIAGNOSIS — D509 Iron deficiency anemia, unspecified: Secondary | ICD-10-CM | POA: Diagnosis not present

## 2017-02-14 DIAGNOSIS — D631 Anemia in chronic kidney disease: Secondary | ICD-10-CM | POA: Diagnosis not present

## 2017-02-14 DIAGNOSIS — N2581 Secondary hyperparathyroidism of renal origin: Secondary | ICD-10-CM | POA: Diagnosis not present

## 2017-02-16 DIAGNOSIS — D509 Iron deficiency anemia, unspecified: Secondary | ICD-10-CM | POA: Diagnosis not present

## 2017-02-16 DIAGNOSIS — N186 End stage renal disease: Secondary | ICD-10-CM | POA: Diagnosis not present

## 2017-02-16 DIAGNOSIS — N2581 Secondary hyperparathyroidism of renal origin: Secondary | ICD-10-CM | POA: Diagnosis not present

## 2017-02-16 DIAGNOSIS — D631 Anemia in chronic kidney disease: Secondary | ICD-10-CM | POA: Diagnosis not present

## 2017-02-19 DIAGNOSIS — D631 Anemia in chronic kidney disease: Secondary | ICD-10-CM | POA: Diagnosis not present

## 2017-02-19 DIAGNOSIS — N186 End stage renal disease: Secondary | ICD-10-CM | POA: Diagnosis not present

## 2017-02-19 DIAGNOSIS — D509 Iron deficiency anemia, unspecified: Secondary | ICD-10-CM | POA: Diagnosis not present

## 2017-02-19 DIAGNOSIS — N2581 Secondary hyperparathyroidism of renal origin: Secondary | ICD-10-CM | POA: Diagnosis not present

## 2017-02-21 DIAGNOSIS — D631 Anemia in chronic kidney disease: Secondary | ICD-10-CM | POA: Diagnosis not present

## 2017-02-21 DIAGNOSIS — N186 End stage renal disease: Secondary | ICD-10-CM | POA: Diagnosis not present

## 2017-02-21 DIAGNOSIS — N2581 Secondary hyperparathyroidism of renal origin: Secondary | ICD-10-CM | POA: Diagnosis not present

## 2017-02-21 DIAGNOSIS — D509 Iron deficiency anemia, unspecified: Secondary | ICD-10-CM | POA: Diagnosis not present

## 2017-02-23 DIAGNOSIS — N186 End stage renal disease: Secondary | ICD-10-CM | POA: Diagnosis not present

## 2017-02-23 DIAGNOSIS — D509 Iron deficiency anemia, unspecified: Secondary | ICD-10-CM | POA: Diagnosis not present

## 2017-02-23 DIAGNOSIS — N2581 Secondary hyperparathyroidism of renal origin: Secondary | ICD-10-CM | POA: Diagnosis not present

## 2017-02-23 DIAGNOSIS — D631 Anemia in chronic kidney disease: Secondary | ICD-10-CM | POA: Diagnosis not present

## 2017-02-26 DIAGNOSIS — N186 End stage renal disease: Secondary | ICD-10-CM | POA: Diagnosis not present

## 2017-02-26 DIAGNOSIS — D631 Anemia in chronic kidney disease: Secondary | ICD-10-CM | POA: Diagnosis not present

## 2017-02-26 DIAGNOSIS — D509 Iron deficiency anemia, unspecified: Secondary | ICD-10-CM | POA: Diagnosis not present

## 2017-02-26 DIAGNOSIS — N2581 Secondary hyperparathyroidism of renal origin: Secondary | ICD-10-CM | POA: Diagnosis not present

## 2017-02-28 DIAGNOSIS — N186 End stage renal disease: Secondary | ICD-10-CM | POA: Diagnosis not present

## 2017-02-28 DIAGNOSIS — D631 Anemia in chronic kidney disease: Secondary | ICD-10-CM | POA: Diagnosis not present

## 2017-02-28 DIAGNOSIS — D509 Iron deficiency anemia, unspecified: Secondary | ICD-10-CM | POA: Diagnosis not present

## 2017-02-28 DIAGNOSIS — N2581 Secondary hyperparathyroidism of renal origin: Secondary | ICD-10-CM | POA: Diagnosis not present

## 2017-03-02 DIAGNOSIS — N186 End stage renal disease: Secondary | ICD-10-CM | POA: Diagnosis not present

## 2017-03-02 DIAGNOSIS — D509 Iron deficiency anemia, unspecified: Secondary | ICD-10-CM | POA: Diagnosis not present

## 2017-03-02 DIAGNOSIS — N2581 Secondary hyperparathyroidism of renal origin: Secondary | ICD-10-CM | POA: Diagnosis not present

## 2017-03-02 DIAGNOSIS — D631 Anemia in chronic kidney disease: Secondary | ICD-10-CM | POA: Diagnosis not present

## 2017-03-07 DIAGNOSIS — D631 Anemia in chronic kidney disease: Secondary | ICD-10-CM | POA: Diagnosis not present

## 2017-03-07 DIAGNOSIS — N186 End stage renal disease: Secondary | ICD-10-CM | POA: Diagnosis not present

## 2017-03-07 DIAGNOSIS — D509 Iron deficiency anemia, unspecified: Secondary | ICD-10-CM | POA: Diagnosis not present

## 2017-03-07 DIAGNOSIS — N2581 Secondary hyperparathyroidism of renal origin: Secondary | ICD-10-CM | POA: Diagnosis not present

## 2017-03-09 DIAGNOSIS — D509 Iron deficiency anemia, unspecified: Secondary | ICD-10-CM | POA: Diagnosis not present

## 2017-03-09 DIAGNOSIS — D631 Anemia in chronic kidney disease: Secondary | ICD-10-CM | POA: Diagnosis not present

## 2017-03-09 DIAGNOSIS — N186 End stage renal disease: Secondary | ICD-10-CM | POA: Diagnosis not present

## 2017-03-09 DIAGNOSIS — N2581 Secondary hyperparathyroidism of renal origin: Secondary | ICD-10-CM | POA: Diagnosis not present

## 2017-03-12 DIAGNOSIS — D509 Iron deficiency anemia, unspecified: Secondary | ICD-10-CM | POA: Diagnosis not present

## 2017-03-12 DIAGNOSIS — N2581 Secondary hyperparathyroidism of renal origin: Secondary | ICD-10-CM | POA: Diagnosis not present

## 2017-03-12 DIAGNOSIS — N186 End stage renal disease: Secondary | ICD-10-CM | POA: Diagnosis not present

## 2017-03-12 DIAGNOSIS — D631 Anemia in chronic kidney disease: Secondary | ICD-10-CM | POA: Diagnosis not present

## 2017-03-14 DIAGNOSIS — D631 Anemia in chronic kidney disease: Secondary | ICD-10-CM | POA: Diagnosis not present

## 2017-03-14 DIAGNOSIS — D509 Iron deficiency anemia, unspecified: Secondary | ICD-10-CM | POA: Diagnosis not present

## 2017-03-14 DIAGNOSIS — N186 End stage renal disease: Secondary | ICD-10-CM | POA: Diagnosis not present

## 2017-03-14 DIAGNOSIS — N2581 Secondary hyperparathyroidism of renal origin: Secondary | ICD-10-CM | POA: Diagnosis not present

## 2017-03-15 DIAGNOSIS — N186 End stage renal disease: Secondary | ICD-10-CM | POA: Diagnosis not present

## 2017-03-15 DIAGNOSIS — N2889 Other specified disorders of kidney and ureter: Secondary | ICD-10-CM | POA: Diagnosis not present

## 2017-03-15 DIAGNOSIS — Z992 Dependence on renal dialysis: Secondary | ICD-10-CM | POA: Diagnosis not present

## 2017-03-16 DIAGNOSIS — N186 End stage renal disease: Secondary | ICD-10-CM | POA: Diagnosis not present

## 2017-03-16 DIAGNOSIS — N2889 Other specified disorders of kidney and ureter: Secondary | ICD-10-CM | POA: Diagnosis not present

## 2017-03-16 DIAGNOSIS — D509 Iron deficiency anemia, unspecified: Secondary | ICD-10-CM | POA: Diagnosis not present

## 2017-03-16 DIAGNOSIS — Z992 Dependence on renal dialysis: Secondary | ICD-10-CM | POA: Diagnosis not present

## 2017-03-16 DIAGNOSIS — N2581 Secondary hyperparathyroidism of renal origin: Secondary | ICD-10-CM | POA: Diagnosis not present

## 2017-03-19 DIAGNOSIS — N2581 Secondary hyperparathyroidism of renal origin: Secondary | ICD-10-CM | POA: Diagnosis not present

## 2017-03-19 DIAGNOSIS — D509 Iron deficiency anemia, unspecified: Secondary | ICD-10-CM | POA: Diagnosis not present

## 2017-03-19 DIAGNOSIS — N186 End stage renal disease: Secondary | ICD-10-CM | POA: Diagnosis not present

## 2017-03-21 DIAGNOSIS — N2581 Secondary hyperparathyroidism of renal origin: Secondary | ICD-10-CM | POA: Diagnosis not present

## 2017-03-21 DIAGNOSIS — D509 Iron deficiency anemia, unspecified: Secondary | ICD-10-CM | POA: Diagnosis not present

## 2017-03-21 DIAGNOSIS — N186 End stage renal disease: Secondary | ICD-10-CM | POA: Diagnosis not present

## 2017-03-23 DIAGNOSIS — N186 End stage renal disease: Secondary | ICD-10-CM | POA: Diagnosis not present

## 2017-03-23 DIAGNOSIS — N2581 Secondary hyperparathyroidism of renal origin: Secondary | ICD-10-CM | POA: Diagnosis not present

## 2017-03-23 DIAGNOSIS — D509 Iron deficiency anemia, unspecified: Secondary | ICD-10-CM | POA: Diagnosis not present

## 2017-03-26 DIAGNOSIS — N186 End stage renal disease: Secondary | ICD-10-CM | POA: Diagnosis not present

## 2017-03-26 DIAGNOSIS — D509 Iron deficiency anemia, unspecified: Secondary | ICD-10-CM | POA: Diagnosis not present

## 2017-03-26 DIAGNOSIS — N2581 Secondary hyperparathyroidism of renal origin: Secondary | ICD-10-CM | POA: Diagnosis not present

## 2017-03-28 DIAGNOSIS — N186 End stage renal disease: Secondary | ICD-10-CM | POA: Diagnosis not present

## 2017-03-28 DIAGNOSIS — D509 Iron deficiency anemia, unspecified: Secondary | ICD-10-CM | POA: Diagnosis not present

## 2017-03-28 DIAGNOSIS — N2581 Secondary hyperparathyroidism of renal origin: Secondary | ICD-10-CM | POA: Diagnosis not present

## 2017-03-30 DIAGNOSIS — N186 End stage renal disease: Secondary | ICD-10-CM | POA: Diagnosis not present

## 2017-03-30 DIAGNOSIS — N2581 Secondary hyperparathyroidism of renal origin: Secondary | ICD-10-CM | POA: Diagnosis not present

## 2017-03-30 DIAGNOSIS — D509 Iron deficiency anemia, unspecified: Secondary | ICD-10-CM | POA: Diagnosis not present

## 2017-04-04 DIAGNOSIS — D509 Iron deficiency anemia, unspecified: Secondary | ICD-10-CM | POA: Diagnosis not present

## 2017-04-04 DIAGNOSIS — N186 End stage renal disease: Secondary | ICD-10-CM | POA: Diagnosis not present

## 2017-04-04 DIAGNOSIS — N2581 Secondary hyperparathyroidism of renal origin: Secondary | ICD-10-CM | POA: Diagnosis not present

## 2017-04-06 DIAGNOSIS — D509 Iron deficiency anemia, unspecified: Secondary | ICD-10-CM | POA: Diagnosis not present

## 2017-04-06 DIAGNOSIS — N186 End stage renal disease: Secondary | ICD-10-CM | POA: Diagnosis not present

## 2017-04-06 DIAGNOSIS — N2581 Secondary hyperparathyroidism of renal origin: Secondary | ICD-10-CM | POA: Diagnosis not present

## 2017-04-09 DIAGNOSIS — N2581 Secondary hyperparathyroidism of renal origin: Secondary | ICD-10-CM | POA: Diagnosis not present

## 2017-04-09 DIAGNOSIS — N186 End stage renal disease: Secondary | ICD-10-CM | POA: Diagnosis not present

## 2017-04-09 DIAGNOSIS — D509 Iron deficiency anemia, unspecified: Secondary | ICD-10-CM | POA: Diagnosis not present

## 2017-04-11 DIAGNOSIS — N186 End stage renal disease: Secondary | ICD-10-CM | POA: Diagnosis not present

## 2017-04-11 DIAGNOSIS — D509 Iron deficiency anemia, unspecified: Secondary | ICD-10-CM | POA: Diagnosis not present

## 2017-04-11 DIAGNOSIS — N2581 Secondary hyperparathyroidism of renal origin: Secondary | ICD-10-CM | POA: Diagnosis not present

## 2017-04-13 DIAGNOSIS — N186 End stage renal disease: Secondary | ICD-10-CM | POA: Diagnosis not present

## 2017-04-13 DIAGNOSIS — D509 Iron deficiency anemia, unspecified: Secondary | ICD-10-CM | POA: Diagnosis not present

## 2017-04-13 DIAGNOSIS — Z992 Dependence on renal dialysis: Secondary | ICD-10-CM | POA: Diagnosis not present

## 2017-04-13 DIAGNOSIS — N2889 Other specified disorders of kidney and ureter: Secondary | ICD-10-CM | POA: Diagnosis not present

## 2017-04-13 DIAGNOSIS — N2581 Secondary hyperparathyroidism of renal origin: Secondary | ICD-10-CM | POA: Diagnosis not present

## 2017-04-13 DIAGNOSIS — D631 Anemia in chronic kidney disease: Secondary | ICD-10-CM | POA: Diagnosis not present

## 2017-04-16 DIAGNOSIS — N2581 Secondary hyperparathyroidism of renal origin: Secondary | ICD-10-CM | POA: Diagnosis not present

## 2017-04-16 DIAGNOSIS — N186 End stage renal disease: Secondary | ICD-10-CM | POA: Diagnosis not present

## 2017-04-16 DIAGNOSIS — D631 Anemia in chronic kidney disease: Secondary | ICD-10-CM | POA: Diagnosis not present

## 2017-04-16 DIAGNOSIS — D509 Iron deficiency anemia, unspecified: Secondary | ICD-10-CM | POA: Diagnosis not present

## 2017-04-18 DIAGNOSIS — N186 End stage renal disease: Secondary | ICD-10-CM | POA: Diagnosis not present

## 2017-04-18 DIAGNOSIS — D631 Anemia in chronic kidney disease: Secondary | ICD-10-CM | POA: Diagnosis not present

## 2017-04-18 DIAGNOSIS — D509 Iron deficiency anemia, unspecified: Secondary | ICD-10-CM | POA: Diagnosis not present

## 2017-04-18 DIAGNOSIS — N2581 Secondary hyperparathyroidism of renal origin: Secondary | ICD-10-CM | POA: Diagnosis not present

## 2017-04-23 DIAGNOSIS — N2581 Secondary hyperparathyroidism of renal origin: Secondary | ICD-10-CM | POA: Diagnosis not present

## 2017-04-23 DIAGNOSIS — D631 Anemia in chronic kidney disease: Secondary | ICD-10-CM | POA: Diagnosis not present

## 2017-04-23 DIAGNOSIS — N186 End stage renal disease: Secondary | ICD-10-CM | POA: Diagnosis not present

## 2017-04-23 DIAGNOSIS — D509 Iron deficiency anemia, unspecified: Secondary | ICD-10-CM | POA: Diagnosis not present

## 2017-04-25 DIAGNOSIS — N2581 Secondary hyperparathyroidism of renal origin: Secondary | ICD-10-CM | POA: Diagnosis not present

## 2017-04-25 DIAGNOSIS — D631 Anemia in chronic kidney disease: Secondary | ICD-10-CM | POA: Diagnosis not present

## 2017-04-25 DIAGNOSIS — D509 Iron deficiency anemia, unspecified: Secondary | ICD-10-CM | POA: Diagnosis not present

## 2017-04-25 DIAGNOSIS — N186 End stage renal disease: Secondary | ICD-10-CM | POA: Diagnosis not present

## 2017-04-27 DIAGNOSIS — N186 End stage renal disease: Secondary | ICD-10-CM | POA: Diagnosis not present

## 2017-04-27 DIAGNOSIS — N2581 Secondary hyperparathyroidism of renal origin: Secondary | ICD-10-CM | POA: Diagnosis not present

## 2017-04-27 DIAGNOSIS — D509 Iron deficiency anemia, unspecified: Secondary | ICD-10-CM | POA: Diagnosis not present

## 2017-04-27 DIAGNOSIS — D631 Anemia in chronic kidney disease: Secondary | ICD-10-CM | POA: Diagnosis not present

## 2017-04-30 DIAGNOSIS — N2581 Secondary hyperparathyroidism of renal origin: Secondary | ICD-10-CM | POA: Diagnosis not present

## 2017-04-30 DIAGNOSIS — D509 Iron deficiency anemia, unspecified: Secondary | ICD-10-CM | POA: Diagnosis not present

## 2017-04-30 DIAGNOSIS — N186 End stage renal disease: Secondary | ICD-10-CM | POA: Diagnosis not present

## 2017-04-30 DIAGNOSIS — D631 Anemia in chronic kidney disease: Secondary | ICD-10-CM | POA: Diagnosis not present

## 2017-05-02 DIAGNOSIS — D509 Iron deficiency anemia, unspecified: Secondary | ICD-10-CM | POA: Diagnosis not present

## 2017-05-02 DIAGNOSIS — N2581 Secondary hyperparathyroidism of renal origin: Secondary | ICD-10-CM | POA: Diagnosis not present

## 2017-05-02 DIAGNOSIS — D631 Anemia in chronic kidney disease: Secondary | ICD-10-CM | POA: Diagnosis not present

## 2017-05-02 DIAGNOSIS — N186 End stage renal disease: Secondary | ICD-10-CM | POA: Diagnosis not present

## 2017-05-04 DIAGNOSIS — D631 Anemia in chronic kidney disease: Secondary | ICD-10-CM | POA: Diagnosis not present

## 2017-05-04 DIAGNOSIS — N186 End stage renal disease: Secondary | ICD-10-CM | POA: Diagnosis not present

## 2017-05-04 DIAGNOSIS — D509 Iron deficiency anemia, unspecified: Secondary | ICD-10-CM | POA: Diagnosis not present

## 2017-05-04 DIAGNOSIS — N2581 Secondary hyperparathyroidism of renal origin: Secondary | ICD-10-CM | POA: Diagnosis not present

## 2017-05-07 DIAGNOSIS — N2581 Secondary hyperparathyroidism of renal origin: Secondary | ICD-10-CM | POA: Diagnosis not present

## 2017-05-07 DIAGNOSIS — N186 End stage renal disease: Secondary | ICD-10-CM | POA: Diagnosis not present

## 2017-05-07 DIAGNOSIS — D631 Anemia in chronic kidney disease: Secondary | ICD-10-CM | POA: Diagnosis not present

## 2017-05-07 DIAGNOSIS — D509 Iron deficiency anemia, unspecified: Secondary | ICD-10-CM | POA: Diagnosis not present

## 2017-05-09 DIAGNOSIS — N186 End stage renal disease: Secondary | ICD-10-CM | POA: Diagnosis not present

## 2017-05-09 DIAGNOSIS — D509 Iron deficiency anemia, unspecified: Secondary | ICD-10-CM | POA: Diagnosis not present

## 2017-05-09 DIAGNOSIS — N2581 Secondary hyperparathyroidism of renal origin: Secondary | ICD-10-CM | POA: Diagnosis not present

## 2017-05-09 DIAGNOSIS — D631 Anemia in chronic kidney disease: Secondary | ICD-10-CM | POA: Diagnosis not present

## 2017-05-11 DIAGNOSIS — D509 Iron deficiency anemia, unspecified: Secondary | ICD-10-CM | POA: Diagnosis not present

## 2017-05-11 DIAGNOSIS — D631 Anemia in chronic kidney disease: Secondary | ICD-10-CM | POA: Diagnosis not present

## 2017-05-11 DIAGNOSIS — N186 End stage renal disease: Secondary | ICD-10-CM | POA: Diagnosis not present

## 2017-05-11 DIAGNOSIS — N2581 Secondary hyperparathyroidism of renal origin: Secondary | ICD-10-CM | POA: Diagnosis not present

## 2017-05-14 DIAGNOSIS — N2889 Other specified disorders of kidney and ureter: Secondary | ICD-10-CM | POA: Diagnosis not present

## 2017-05-14 DIAGNOSIS — N2581 Secondary hyperparathyroidism of renal origin: Secondary | ICD-10-CM | POA: Diagnosis not present

## 2017-05-14 DIAGNOSIS — D509 Iron deficiency anemia, unspecified: Secondary | ICD-10-CM | POA: Diagnosis not present

## 2017-05-14 DIAGNOSIS — N186 End stage renal disease: Secondary | ICD-10-CM | POA: Diagnosis not present

## 2017-05-14 DIAGNOSIS — Z992 Dependence on renal dialysis: Secondary | ICD-10-CM | POA: Diagnosis not present

## 2017-05-18 DIAGNOSIS — N186 End stage renal disease: Secondary | ICD-10-CM | POA: Diagnosis not present

## 2017-05-18 DIAGNOSIS — N2581 Secondary hyperparathyroidism of renal origin: Secondary | ICD-10-CM | POA: Diagnosis not present

## 2017-05-18 DIAGNOSIS — D509 Iron deficiency anemia, unspecified: Secondary | ICD-10-CM | POA: Diagnosis not present

## 2017-05-21 DIAGNOSIS — N2581 Secondary hyperparathyroidism of renal origin: Secondary | ICD-10-CM | POA: Diagnosis not present

## 2017-05-21 DIAGNOSIS — N186 End stage renal disease: Secondary | ICD-10-CM | POA: Diagnosis not present

## 2017-05-21 DIAGNOSIS — D509 Iron deficiency anemia, unspecified: Secondary | ICD-10-CM | POA: Diagnosis not present

## 2017-05-23 DIAGNOSIS — N2581 Secondary hyperparathyroidism of renal origin: Secondary | ICD-10-CM | POA: Diagnosis not present

## 2017-05-23 DIAGNOSIS — N186 End stage renal disease: Secondary | ICD-10-CM | POA: Diagnosis not present

## 2017-05-23 DIAGNOSIS — D509 Iron deficiency anemia, unspecified: Secondary | ICD-10-CM | POA: Diagnosis not present

## 2017-05-25 DIAGNOSIS — N2581 Secondary hyperparathyroidism of renal origin: Secondary | ICD-10-CM | POA: Diagnosis not present

## 2017-05-25 DIAGNOSIS — D509 Iron deficiency anemia, unspecified: Secondary | ICD-10-CM | POA: Diagnosis not present

## 2017-05-25 DIAGNOSIS — N186 End stage renal disease: Secondary | ICD-10-CM | POA: Diagnosis not present

## 2017-05-30 DIAGNOSIS — N186 End stage renal disease: Secondary | ICD-10-CM | POA: Diagnosis not present

## 2017-05-30 DIAGNOSIS — D509 Iron deficiency anemia, unspecified: Secondary | ICD-10-CM | POA: Diagnosis not present

## 2017-05-30 DIAGNOSIS — N2581 Secondary hyperparathyroidism of renal origin: Secondary | ICD-10-CM | POA: Diagnosis not present

## 2017-06-01 DIAGNOSIS — D509 Iron deficiency anemia, unspecified: Secondary | ICD-10-CM | POA: Diagnosis not present

## 2017-06-01 DIAGNOSIS — N186 End stage renal disease: Secondary | ICD-10-CM | POA: Diagnosis not present

## 2017-06-01 DIAGNOSIS — N2581 Secondary hyperparathyroidism of renal origin: Secondary | ICD-10-CM | POA: Diagnosis not present

## 2017-06-04 DIAGNOSIS — N2581 Secondary hyperparathyroidism of renal origin: Secondary | ICD-10-CM | POA: Diagnosis not present

## 2017-06-04 DIAGNOSIS — N186 End stage renal disease: Secondary | ICD-10-CM | POA: Diagnosis not present

## 2017-06-04 DIAGNOSIS — D509 Iron deficiency anemia, unspecified: Secondary | ICD-10-CM | POA: Diagnosis not present

## 2017-06-07 DIAGNOSIS — L814 Other melanin hyperpigmentation: Secondary | ICD-10-CM | POA: Diagnosis not present

## 2017-06-07 DIAGNOSIS — Z720 Tobacco use: Secondary | ICD-10-CM | POA: Diagnosis not present

## 2017-06-07 DIAGNOSIS — R239 Unspecified skin changes: Secondary | ICD-10-CM | POA: Diagnosis not present

## 2017-06-07 DIAGNOSIS — L659 Nonscarring hair loss, unspecified: Secondary | ICD-10-CM | POA: Diagnosis not present

## 2017-06-08 DIAGNOSIS — D509 Iron deficiency anemia, unspecified: Secondary | ICD-10-CM | POA: Diagnosis not present

## 2017-06-08 DIAGNOSIS — N186 End stage renal disease: Secondary | ICD-10-CM | POA: Diagnosis not present

## 2017-06-08 DIAGNOSIS — N2581 Secondary hyperparathyroidism of renal origin: Secondary | ICD-10-CM | POA: Diagnosis not present

## 2017-06-11 DIAGNOSIS — N2581 Secondary hyperparathyroidism of renal origin: Secondary | ICD-10-CM | POA: Diagnosis not present

## 2017-06-11 DIAGNOSIS — N186 End stage renal disease: Secondary | ICD-10-CM | POA: Diagnosis not present

## 2017-06-11 DIAGNOSIS — D509 Iron deficiency anemia, unspecified: Secondary | ICD-10-CM | POA: Diagnosis not present

## 2017-06-13 DIAGNOSIS — N2581 Secondary hyperparathyroidism of renal origin: Secondary | ICD-10-CM | POA: Diagnosis not present

## 2017-06-13 DIAGNOSIS — N186 End stage renal disease: Secondary | ICD-10-CM | POA: Diagnosis not present

## 2017-06-13 DIAGNOSIS — N2889 Other specified disorders of kidney and ureter: Secondary | ICD-10-CM | POA: Diagnosis not present

## 2017-06-13 DIAGNOSIS — Z992 Dependence on renal dialysis: Secondary | ICD-10-CM | POA: Diagnosis not present

## 2017-06-15 DIAGNOSIS — N186 End stage renal disease: Secondary | ICD-10-CM | POA: Diagnosis not present

## 2017-06-15 DIAGNOSIS — N2581 Secondary hyperparathyroidism of renal origin: Secondary | ICD-10-CM | POA: Diagnosis not present

## 2017-06-18 DIAGNOSIS — N186 End stage renal disease: Secondary | ICD-10-CM | POA: Diagnosis not present

## 2017-06-18 DIAGNOSIS — N2581 Secondary hyperparathyroidism of renal origin: Secondary | ICD-10-CM | POA: Diagnosis not present

## 2017-06-22 DIAGNOSIS — N186 End stage renal disease: Secondary | ICD-10-CM | POA: Diagnosis not present

## 2017-06-22 DIAGNOSIS — N2581 Secondary hyperparathyroidism of renal origin: Secondary | ICD-10-CM | POA: Diagnosis not present

## 2017-06-29 DIAGNOSIS — N186 End stage renal disease: Secondary | ICD-10-CM | POA: Diagnosis not present

## 2017-06-29 DIAGNOSIS — N2581 Secondary hyperparathyroidism of renal origin: Secondary | ICD-10-CM | POA: Diagnosis not present

## 2017-07-04 DIAGNOSIS — N2581 Secondary hyperparathyroidism of renal origin: Secondary | ICD-10-CM | POA: Diagnosis not present

## 2017-07-04 DIAGNOSIS — N186 End stage renal disease: Secondary | ICD-10-CM | POA: Diagnosis not present

## 2017-07-05 DIAGNOSIS — R239 Unspecified skin changes: Secondary | ICD-10-CM | POA: Diagnosis not present

## 2017-07-05 DIAGNOSIS — L299 Pruritus, unspecified: Secondary | ICD-10-CM | POA: Diagnosis not present

## 2017-07-06 DIAGNOSIS — N2581 Secondary hyperparathyroidism of renal origin: Secondary | ICD-10-CM | POA: Diagnosis not present

## 2017-07-06 DIAGNOSIS — N186 End stage renal disease: Secondary | ICD-10-CM | POA: Diagnosis not present

## 2017-07-09 DIAGNOSIS — N2581 Secondary hyperparathyroidism of renal origin: Secondary | ICD-10-CM | POA: Diagnosis not present

## 2017-07-09 DIAGNOSIS — N186 End stage renal disease: Secondary | ICD-10-CM | POA: Diagnosis not present

## 2017-07-11 DIAGNOSIS — N186 End stage renal disease: Secondary | ICD-10-CM | POA: Diagnosis not present

## 2017-07-11 DIAGNOSIS — N2581 Secondary hyperparathyroidism of renal origin: Secondary | ICD-10-CM | POA: Diagnosis not present

## 2017-07-12 DIAGNOSIS — D649 Anemia, unspecified: Secondary | ICD-10-CM | POA: Diagnosis not present

## 2017-07-12 DIAGNOSIS — R944 Abnormal results of kidney function studies: Secondary | ICD-10-CM | POA: Diagnosis not present

## 2017-07-12 DIAGNOSIS — H9193 Unspecified hearing loss, bilateral: Secondary | ICD-10-CM | POA: Diagnosis not present

## 2017-07-12 DIAGNOSIS — R945 Abnormal results of liver function studies: Secondary | ICD-10-CM | POA: Diagnosis not present

## 2017-07-12 DIAGNOSIS — N185 Chronic kidney disease, stage 5: Secondary | ICD-10-CM | POA: Diagnosis not present

## 2017-07-12 DIAGNOSIS — Z992 Dependence on renal dialysis: Secondary | ICD-10-CM | POA: Diagnosis not present

## 2017-07-12 DIAGNOSIS — I10 Essential (primary) hypertension: Secondary | ICD-10-CM | POA: Diagnosis not present

## 2017-07-12 DIAGNOSIS — I120 Hypertensive chronic kidney disease with stage 5 chronic kidney disease or end stage renal disease: Secondary | ICD-10-CM | POA: Diagnosis not present

## 2017-07-12 DIAGNOSIS — E785 Hyperlipidemia, unspecified: Secondary | ICD-10-CM | POA: Diagnosis not present

## 2017-07-12 DIAGNOSIS — E559 Vitamin D deficiency, unspecified: Secondary | ICD-10-CM | POA: Diagnosis not present

## 2017-07-13 DIAGNOSIS — N2581 Secondary hyperparathyroidism of renal origin: Secondary | ICD-10-CM | POA: Diagnosis not present

## 2017-07-13 DIAGNOSIS — N186 End stage renal disease: Secondary | ICD-10-CM | POA: Diagnosis not present

## 2017-07-14 DIAGNOSIS — Z992 Dependence on renal dialysis: Secondary | ICD-10-CM | POA: Diagnosis not present

## 2017-07-14 DIAGNOSIS — N186 End stage renal disease: Secondary | ICD-10-CM | POA: Diagnosis not present

## 2017-07-14 DIAGNOSIS — N2889 Other specified disorders of kidney and ureter: Secondary | ICD-10-CM | POA: Diagnosis not present

## 2017-07-16 ENCOUNTER — Other Ambulatory Visit: Payer: Self-pay | Admitting: Internal Medicine

## 2017-07-16 DIAGNOSIS — E2839 Other primary ovarian failure: Secondary | ICD-10-CM

## 2017-07-16 DIAGNOSIS — N2581 Secondary hyperparathyroidism of renal origin: Secondary | ICD-10-CM | POA: Diagnosis not present

## 2017-07-16 DIAGNOSIS — Z139 Encounter for screening, unspecified: Secondary | ICD-10-CM

## 2017-07-16 DIAGNOSIS — N186 End stage renal disease: Secondary | ICD-10-CM | POA: Diagnosis not present

## 2017-07-16 DIAGNOSIS — D631 Anemia in chronic kidney disease: Secondary | ICD-10-CM | POA: Diagnosis not present

## 2017-07-18 DIAGNOSIS — D631 Anemia in chronic kidney disease: Secondary | ICD-10-CM | POA: Diagnosis not present

## 2017-07-18 DIAGNOSIS — N186 End stage renal disease: Secondary | ICD-10-CM | POA: Diagnosis not present

## 2017-07-18 DIAGNOSIS — N2581 Secondary hyperparathyroidism of renal origin: Secondary | ICD-10-CM | POA: Diagnosis not present

## 2017-07-20 DIAGNOSIS — D631 Anemia in chronic kidney disease: Secondary | ICD-10-CM | POA: Diagnosis not present

## 2017-07-20 DIAGNOSIS — N2581 Secondary hyperparathyroidism of renal origin: Secondary | ICD-10-CM | POA: Diagnosis not present

## 2017-07-20 DIAGNOSIS — N186 End stage renal disease: Secondary | ICD-10-CM | POA: Diagnosis not present

## 2017-07-23 DIAGNOSIS — N2581 Secondary hyperparathyroidism of renal origin: Secondary | ICD-10-CM | POA: Diagnosis not present

## 2017-07-23 DIAGNOSIS — D631 Anemia in chronic kidney disease: Secondary | ICD-10-CM | POA: Diagnosis not present

## 2017-07-23 DIAGNOSIS — N186 End stage renal disease: Secondary | ICD-10-CM | POA: Diagnosis not present

## 2017-07-25 DIAGNOSIS — N2581 Secondary hyperparathyroidism of renal origin: Secondary | ICD-10-CM | POA: Diagnosis not present

## 2017-07-25 DIAGNOSIS — N186 End stage renal disease: Secondary | ICD-10-CM | POA: Diagnosis not present

## 2017-07-25 DIAGNOSIS — D631 Anemia in chronic kidney disease: Secondary | ICD-10-CM | POA: Diagnosis not present

## 2017-07-27 DIAGNOSIS — N186 End stage renal disease: Secondary | ICD-10-CM | POA: Diagnosis not present

## 2017-07-27 DIAGNOSIS — D631 Anemia in chronic kidney disease: Secondary | ICD-10-CM | POA: Diagnosis not present

## 2017-07-27 DIAGNOSIS — N2581 Secondary hyperparathyroidism of renal origin: Secondary | ICD-10-CM | POA: Diagnosis not present

## 2017-07-30 DIAGNOSIS — N186 End stage renal disease: Secondary | ICD-10-CM | POA: Diagnosis not present

## 2017-07-30 DIAGNOSIS — D631 Anemia in chronic kidney disease: Secondary | ICD-10-CM | POA: Diagnosis not present

## 2017-07-30 DIAGNOSIS — N2581 Secondary hyperparathyroidism of renal origin: Secondary | ICD-10-CM | POA: Diagnosis not present

## 2017-08-01 DIAGNOSIS — D631 Anemia in chronic kidney disease: Secondary | ICD-10-CM | POA: Diagnosis not present

## 2017-08-01 DIAGNOSIS — N186 End stage renal disease: Secondary | ICD-10-CM | POA: Diagnosis not present

## 2017-08-01 DIAGNOSIS — N2581 Secondary hyperparathyroidism of renal origin: Secondary | ICD-10-CM | POA: Diagnosis not present

## 2017-08-03 DIAGNOSIS — N2581 Secondary hyperparathyroidism of renal origin: Secondary | ICD-10-CM | POA: Diagnosis not present

## 2017-08-03 DIAGNOSIS — N186 End stage renal disease: Secondary | ICD-10-CM | POA: Diagnosis not present

## 2017-08-03 DIAGNOSIS — D631 Anemia in chronic kidney disease: Secondary | ICD-10-CM | POA: Diagnosis not present

## 2017-08-06 DIAGNOSIS — N186 End stage renal disease: Secondary | ICD-10-CM | POA: Diagnosis not present

## 2017-08-06 DIAGNOSIS — D631 Anemia in chronic kidney disease: Secondary | ICD-10-CM | POA: Diagnosis not present

## 2017-08-06 DIAGNOSIS — N2581 Secondary hyperparathyroidism of renal origin: Secondary | ICD-10-CM | POA: Diagnosis not present

## 2017-08-07 DIAGNOSIS — L940 Localized scleroderma [morphea]: Secondary | ICD-10-CM | POA: Diagnosis not present

## 2017-08-07 DIAGNOSIS — L989 Disorder of the skin and subcutaneous tissue, unspecified: Secondary | ICD-10-CM | POA: Diagnosis not present

## 2017-08-08 DIAGNOSIS — N186 End stage renal disease: Secondary | ICD-10-CM | POA: Diagnosis not present

## 2017-08-08 DIAGNOSIS — D631 Anemia in chronic kidney disease: Secondary | ICD-10-CM | POA: Diagnosis not present

## 2017-08-08 DIAGNOSIS — N2581 Secondary hyperparathyroidism of renal origin: Secondary | ICD-10-CM | POA: Diagnosis not present

## 2017-08-10 DIAGNOSIS — D631 Anemia in chronic kidney disease: Secondary | ICD-10-CM | POA: Diagnosis not present

## 2017-08-10 DIAGNOSIS — N2581 Secondary hyperparathyroidism of renal origin: Secondary | ICD-10-CM | POA: Diagnosis not present

## 2017-08-10 DIAGNOSIS — N186 End stage renal disease: Secondary | ICD-10-CM | POA: Diagnosis not present

## 2017-08-13 DIAGNOSIS — N2889 Other specified disorders of kidney and ureter: Secondary | ICD-10-CM | POA: Diagnosis not present

## 2017-08-13 DIAGNOSIS — Z992 Dependence on renal dialysis: Secondary | ICD-10-CM | POA: Diagnosis not present

## 2017-08-13 DIAGNOSIS — N186 End stage renal disease: Secondary | ICD-10-CM | POA: Diagnosis not present

## 2017-08-15 DIAGNOSIS — D509 Iron deficiency anemia, unspecified: Secondary | ICD-10-CM | POA: Diagnosis not present

## 2017-08-15 DIAGNOSIS — D631 Anemia in chronic kidney disease: Secondary | ICD-10-CM | POA: Diagnosis not present

## 2017-08-15 DIAGNOSIS — N2581 Secondary hyperparathyroidism of renal origin: Secondary | ICD-10-CM | POA: Diagnosis not present

## 2017-08-15 DIAGNOSIS — N186 End stage renal disease: Secondary | ICD-10-CM | POA: Diagnosis not present

## 2017-08-17 DIAGNOSIS — N186 End stage renal disease: Secondary | ICD-10-CM | POA: Diagnosis not present

## 2017-08-17 DIAGNOSIS — N2581 Secondary hyperparathyroidism of renal origin: Secondary | ICD-10-CM | POA: Diagnosis not present

## 2017-08-17 DIAGNOSIS — D509 Iron deficiency anemia, unspecified: Secondary | ICD-10-CM | POA: Diagnosis not present

## 2017-08-17 DIAGNOSIS — D631 Anemia in chronic kidney disease: Secondary | ICD-10-CM | POA: Diagnosis not present

## 2017-08-22 DIAGNOSIS — D509 Iron deficiency anemia, unspecified: Secondary | ICD-10-CM | POA: Diagnosis not present

## 2017-08-22 DIAGNOSIS — D631 Anemia in chronic kidney disease: Secondary | ICD-10-CM | POA: Diagnosis not present

## 2017-08-22 DIAGNOSIS — N186 End stage renal disease: Secondary | ICD-10-CM | POA: Diagnosis not present

## 2017-08-22 DIAGNOSIS — N2581 Secondary hyperparathyroidism of renal origin: Secondary | ICD-10-CM | POA: Diagnosis not present

## 2017-08-24 DIAGNOSIS — N186 End stage renal disease: Secondary | ICD-10-CM | POA: Diagnosis not present

## 2017-08-24 DIAGNOSIS — D509 Iron deficiency anemia, unspecified: Secondary | ICD-10-CM | POA: Diagnosis not present

## 2017-08-24 DIAGNOSIS — N2581 Secondary hyperparathyroidism of renal origin: Secondary | ICD-10-CM | POA: Diagnosis not present

## 2017-08-24 DIAGNOSIS — D631 Anemia in chronic kidney disease: Secondary | ICD-10-CM | POA: Diagnosis not present

## 2017-08-27 DIAGNOSIS — N2581 Secondary hyperparathyroidism of renal origin: Secondary | ICD-10-CM | POA: Diagnosis not present

## 2017-08-27 DIAGNOSIS — D509 Iron deficiency anemia, unspecified: Secondary | ICD-10-CM | POA: Diagnosis not present

## 2017-08-27 DIAGNOSIS — N186 End stage renal disease: Secondary | ICD-10-CM | POA: Diagnosis not present

## 2017-08-27 DIAGNOSIS — D631 Anemia in chronic kidney disease: Secondary | ICD-10-CM | POA: Diagnosis not present

## 2017-08-29 DIAGNOSIS — N2581 Secondary hyperparathyroidism of renal origin: Secondary | ICD-10-CM | POA: Diagnosis not present

## 2017-08-29 DIAGNOSIS — D509 Iron deficiency anemia, unspecified: Secondary | ICD-10-CM | POA: Diagnosis not present

## 2017-08-29 DIAGNOSIS — D631 Anemia in chronic kidney disease: Secondary | ICD-10-CM | POA: Diagnosis not present

## 2017-08-29 DIAGNOSIS — N186 End stage renal disease: Secondary | ICD-10-CM | POA: Diagnosis not present

## 2017-08-31 DIAGNOSIS — N186 End stage renal disease: Secondary | ICD-10-CM | POA: Diagnosis not present

## 2017-08-31 DIAGNOSIS — D631 Anemia in chronic kidney disease: Secondary | ICD-10-CM | POA: Diagnosis not present

## 2017-08-31 DIAGNOSIS — D509 Iron deficiency anemia, unspecified: Secondary | ICD-10-CM | POA: Diagnosis not present

## 2017-08-31 DIAGNOSIS — N2581 Secondary hyperparathyroidism of renal origin: Secondary | ICD-10-CM | POA: Diagnosis not present

## 2017-09-03 DIAGNOSIS — N186 End stage renal disease: Secondary | ICD-10-CM | POA: Diagnosis not present

## 2017-09-03 DIAGNOSIS — D631 Anemia in chronic kidney disease: Secondary | ICD-10-CM | POA: Diagnosis not present

## 2017-09-03 DIAGNOSIS — N2581 Secondary hyperparathyroidism of renal origin: Secondary | ICD-10-CM | POA: Diagnosis not present

## 2017-09-03 DIAGNOSIS — D509 Iron deficiency anemia, unspecified: Secondary | ICD-10-CM | POA: Diagnosis not present

## 2017-09-05 DIAGNOSIS — N2581 Secondary hyperparathyroidism of renal origin: Secondary | ICD-10-CM | POA: Diagnosis not present

## 2017-09-05 DIAGNOSIS — D631 Anemia in chronic kidney disease: Secondary | ICD-10-CM | POA: Diagnosis not present

## 2017-09-05 DIAGNOSIS — N186 End stage renal disease: Secondary | ICD-10-CM | POA: Diagnosis not present

## 2017-09-05 DIAGNOSIS — D509 Iron deficiency anemia, unspecified: Secondary | ICD-10-CM | POA: Diagnosis not present

## 2017-09-07 DIAGNOSIS — D631 Anemia in chronic kidney disease: Secondary | ICD-10-CM | POA: Diagnosis not present

## 2017-09-07 DIAGNOSIS — N2581 Secondary hyperparathyroidism of renal origin: Secondary | ICD-10-CM | POA: Diagnosis not present

## 2017-09-07 DIAGNOSIS — D509 Iron deficiency anemia, unspecified: Secondary | ICD-10-CM | POA: Diagnosis not present

## 2017-09-07 DIAGNOSIS — N186 End stage renal disease: Secondary | ICD-10-CM | POA: Diagnosis not present

## 2017-09-10 DIAGNOSIS — N186 End stage renal disease: Secondary | ICD-10-CM | POA: Diagnosis not present

## 2017-09-10 DIAGNOSIS — D631 Anemia in chronic kidney disease: Secondary | ICD-10-CM | POA: Diagnosis not present

## 2017-09-10 DIAGNOSIS — N2581 Secondary hyperparathyroidism of renal origin: Secondary | ICD-10-CM | POA: Diagnosis not present

## 2017-09-10 DIAGNOSIS — D509 Iron deficiency anemia, unspecified: Secondary | ICD-10-CM | POA: Diagnosis not present

## 2017-09-12 DIAGNOSIS — N186 End stage renal disease: Secondary | ICD-10-CM | POA: Diagnosis not present

## 2017-09-12 DIAGNOSIS — N2581 Secondary hyperparathyroidism of renal origin: Secondary | ICD-10-CM | POA: Diagnosis not present

## 2017-09-12 DIAGNOSIS — D509 Iron deficiency anemia, unspecified: Secondary | ICD-10-CM | POA: Diagnosis not present

## 2017-09-12 DIAGNOSIS — D631 Anemia in chronic kidney disease: Secondary | ICD-10-CM | POA: Diagnosis not present

## 2017-09-13 DIAGNOSIS — N186 End stage renal disease: Secondary | ICD-10-CM | POA: Diagnosis not present

## 2017-09-13 DIAGNOSIS — Z992 Dependence on renal dialysis: Secondary | ICD-10-CM | POA: Diagnosis not present

## 2017-09-13 DIAGNOSIS — N2889 Other specified disorders of kidney and ureter: Secondary | ICD-10-CM | POA: Diagnosis not present

## 2017-09-14 DIAGNOSIS — N2581 Secondary hyperparathyroidism of renal origin: Secondary | ICD-10-CM | POA: Diagnosis not present

## 2017-09-14 DIAGNOSIS — N186 End stage renal disease: Secondary | ICD-10-CM | POA: Diagnosis not present

## 2017-09-14 DIAGNOSIS — D509 Iron deficiency anemia, unspecified: Secondary | ICD-10-CM | POA: Diagnosis not present

## 2017-09-17 DIAGNOSIS — N186 End stage renal disease: Secondary | ICD-10-CM | POA: Diagnosis not present

## 2017-09-17 DIAGNOSIS — N2581 Secondary hyperparathyroidism of renal origin: Secondary | ICD-10-CM | POA: Diagnosis not present

## 2017-09-17 DIAGNOSIS — D509 Iron deficiency anemia, unspecified: Secondary | ICD-10-CM | POA: Diagnosis not present

## 2017-09-19 DIAGNOSIS — D509 Iron deficiency anemia, unspecified: Secondary | ICD-10-CM | POA: Diagnosis not present

## 2017-09-19 DIAGNOSIS — N186 End stage renal disease: Secondary | ICD-10-CM | POA: Diagnosis not present

## 2017-09-19 DIAGNOSIS — N2581 Secondary hyperparathyroidism of renal origin: Secondary | ICD-10-CM | POA: Diagnosis not present

## 2017-09-21 DIAGNOSIS — N186 End stage renal disease: Secondary | ICD-10-CM | POA: Diagnosis not present

## 2017-09-21 DIAGNOSIS — N2581 Secondary hyperparathyroidism of renal origin: Secondary | ICD-10-CM | POA: Diagnosis not present

## 2017-09-21 DIAGNOSIS — D509 Iron deficiency anemia, unspecified: Secondary | ICD-10-CM | POA: Diagnosis not present

## 2017-09-24 DIAGNOSIS — N186 End stage renal disease: Secondary | ICD-10-CM | POA: Diagnosis not present

## 2017-09-24 DIAGNOSIS — D509 Iron deficiency anemia, unspecified: Secondary | ICD-10-CM | POA: Diagnosis not present

## 2017-09-24 DIAGNOSIS — N2581 Secondary hyperparathyroidism of renal origin: Secondary | ICD-10-CM | POA: Diagnosis not present

## 2017-09-26 DIAGNOSIS — N186 End stage renal disease: Secondary | ICD-10-CM | POA: Diagnosis not present

## 2017-09-26 DIAGNOSIS — N2581 Secondary hyperparathyroidism of renal origin: Secondary | ICD-10-CM | POA: Diagnosis not present

## 2017-09-26 DIAGNOSIS — D509 Iron deficiency anemia, unspecified: Secondary | ICD-10-CM | POA: Diagnosis not present

## 2017-09-28 DIAGNOSIS — N2581 Secondary hyperparathyroidism of renal origin: Secondary | ICD-10-CM | POA: Diagnosis not present

## 2017-09-28 DIAGNOSIS — D509 Iron deficiency anemia, unspecified: Secondary | ICD-10-CM | POA: Diagnosis not present

## 2017-09-28 DIAGNOSIS — N186 End stage renal disease: Secondary | ICD-10-CM | POA: Diagnosis not present

## 2017-10-01 DIAGNOSIS — N2581 Secondary hyperparathyroidism of renal origin: Secondary | ICD-10-CM | POA: Diagnosis not present

## 2017-10-01 DIAGNOSIS — N186 End stage renal disease: Secondary | ICD-10-CM | POA: Diagnosis not present

## 2017-10-01 DIAGNOSIS — D509 Iron deficiency anemia, unspecified: Secondary | ICD-10-CM | POA: Diagnosis not present

## 2017-10-03 DIAGNOSIS — N186 End stage renal disease: Secondary | ICD-10-CM | POA: Diagnosis not present

## 2017-10-03 DIAGNOSIS — N2581 Secondary hyperparathyroidism of renal origin: Secondary | ICD-10-CM | POA: Diagnosis not present

## 2017-10-03 DIAGNOSIS — D509 Iron deficiency anemia, unspecified: Secondary | ICD-10-CM | POA: Diagnosis not present

## 2017-10-05 DIAGNOSIS — D509 Iron deficiency anemia, unspecified: Secondary | ICD-10-CM | POA: Diagnosis not present

## 2017-10-05 DIAGNOSIS — N186 End stage renal disease: Secondary | ICD-10-CM | POA: Diagnosis not present

## 2017-10-05 DIAGNOSIS — N2581 Secondary hyperparathyroidism of renal origin: Secondary | ICD-10-CM | POA: Diagnosis not present

## 2017-10-08 DIAGNOSIS — N186 End stage renal disease: Secondary | ICD-10-CM | POA: Diagnosis not present

## 2017-10-08 DIAGNOSIS — D509 Iron deficiency anemia, unspecified: Secondary | ICD-10-CM | POA: Diagnosis not present

## 2017-10-08 DIAGNOSIS — N2581 Secondary hyperparathyroidism of renal origin: Secondary | ICD-10-CM | POA: Diagnosis not present

## 2017-10-10 DIAGNOSIS — N2581 Secondary hyperparathyroidism of renal origin: Secondary | ICD-10-CM | POA: Diagnosis not present

## 2017-10-10 DIAGNOSIS — D509 Iron deficiency anemia, unspecified: Secondary | ICD-10-CM | POA: Diagnosis not present

## 2017-10-10 DIAGNOSIS — N186 End stage renal disease: Secondary | ICD-10-CM | POA: Diagnosis not present

## 2017-10-12 DIAGNOSIS — N2581 Secondary hyperparathyroidism of renal origin: Secondary | ICD-10-CM | POA: Diagnosis not present

## 2017-10-12 DIAGNOSIS — D509 Iron deficiency anemia, unspecified: Secondary | ICD-10-CM | POA: Diagnosis not present

## 2017-10-12 DIAGNOSIS — N186 End stage renal disease: Secondary | ICD-10-CM | POA: Diagnosis not present

## 2017-10-14 DIAGNOSIS — Z992 Dependence on renal dialysis: Secondary | ICD-10-CM | POA: Diagnosis not present

## 2017-10-14 DIAGNOSIS — N2889 Other specified disorders of kidney and ureter: Secondary | ICD-10-CM | POA: Diagnosis not present

## 2017-10-14 DIAGNOSIS — N186 End stage renal disease: Secondary | ICD-10-CM | POA: Diagnosis not present

## 2017-10-17 DIAGNOSIS — N186 End stage renal disease: Secondary | ICD-10-CM | POA: Diagnosis not present

## 2017-10-17 DIAGNOSIS — N2581 Secondary hyperparathyroidism of renal origin: Secondary | ICD-10-CM | POA: Diagnosis not present

## 2017-10-17 DIAGNOSIS — Z23 Encounter for immunization: Secondary | ICD-10-CM | POA: Diagnosis not present

## 2017-10-19 DIAGNOSIS — Z23 Encounter for immunization: Secondary | ICD-10-CM | POA: Diagnosis not present

## 2017-10-19 DIAGNOSIS — N186 End stage renal disease: Secondary | ICD-10-CM | POA: Diagnosis not present

## 2017-10-19 DIAGNOSIS — N2581 Secondary hyperparathyroidism of renal origin: Secondary | ICD-10-CM | POA: Diagnosis not present

## 2017-10-22 DIAGNOSIS — N2581 Secondary hyperparathyroidism of renal origin: Secondary | ICD-10-CM | POA: Diagnosis not present

## 2017-10-22 DIAGNOSIS — N186 End stage renal disease: Secondary | ICD-10-CM | POA: Diagnosis not present

## 2017-10-22 DIAGNOSIS — Z23 Encounter for immunization: Secondary | ICD-10-CM | POA: Diagnosis not present

## 2017-10-24 DIAGNOSIS — N2581 Secondary hyperparathyroidism of renal origin: Secondary | ICD-10-CM | POA: Diagnosis not present

## 2017-10-24 DIAGNOSIS — N186 End stage renal disease: Secondary | ICD-10-CM | POA: Diagnosis not present

## 2017-10-24 DIAGNOSIS — Z23 Encounter for immunization: Secondary | ICD-10-CM | POA: Diagnosis not present

## 2017-10-26 DIAGNOSIS — Z23 Encounter for immunization: Secondary | ICD-10-CM | POA: Diagnosis not present

## 2017-10-26 DIAGNOSIS — N2581 Secondary hyperparathyroidism of renal origin: Secondary | ICD-10-CM | POA: Diagnosis not present

## 2017-10-26 DIAGNOSIS — N186 End stage renal disease: Secondary | ICD-10-CM | POA: Diagnosis not present

## 2017-10-29 DIAGNOSIS — N2581 Secondary hyperparathyroidism of renal origin: Secondary | ICD-10-CM | POA: Diagnosis not present

## 2017-10-29 DIAGNOSIS — N186 End stage renal disease: Secondary | ICD-10-CM | POA: Diagnosis not present

## 2017-10-29 DIAGNOSIS — Z23 Encounter for immunization: Secondary | ICD-10-CM | POA: Diagnosis not present

## 2017-11-02 DIAGNOSIS — N186 End stage renal disease: Secondary | ICD-10-CM | POA: Diagnosis not present

## 2017-11-02 DIAGNOSIS — N2581 Secondary hyperparathyroidism of renal origin: Secondary | ICD-10-CM | POA: Diagnosis not present

## 2017-11-02 DIAGNOSIS — Z23 Encounter for immunization: Secondary | ICD-10-CM | POA: Diagnosis not present

## 2017-11-05 DIAGNOSIS — N186 End stage renal disease: Secondary | ICD-10-CM | POA: Diagnosis not present

## 2017-11-05 DIAGNOSIS — Z23 Encounter for immunization: Secondary | ICD-10-CM | POA: Diagnosis not present

## 2017-11-05 DIAGNOSIS — N2581 Secondary hyperparathyroidism of renal origin: Secondary | ICD-10-CM | POA: Diagnosis not present

## 2017-11-06 DIAGNOSIS — M340 Progressive systemic sclerosis: Secondary | ICD-10-CM | POA: Diagnosis not present

## 2017-11-07 DIAGNOSIS — Z23 Encounter for immunization: Secondary | ICD-10-CM | POA: Diagnosis not present

## 2017-11-07 DIAGNOSIS — N186 End stage renal disease: Secondary | ICD-10-CM | POA: Diagnosis not present

## 2017-11-07 DIAGNOSIS — N2581 Secondary hyperparathyroidism of renal origin: Secondary | ICD-10-CM | POA: Diagnosis not present

## 2017-11-12 DIAGNOSIS — Z23 Encounter for immunization: Secondary | ICD-10-CM | POA: Diagnosis not present

## 2017-11-12 DIAGNOSIS — N2581 Secondary hyperparathyroidism of renal origin: Secondary | ICD-10-CM | POA: Diagnosis not present

## 2017-11-12 DIAGNOSIS — N186 End stage renal disease: Secondary | ICD-10-CM | POA: Diagnosis not present

## 2017-11-13 DIAGNOSIS — Z992 Dependence on renal dialysis: Secondary | ICD-10-CM | POA: Diagnosis not present

## 2017-11-13 DIAGNOSIS — N2889 Other specified disorders of kidney and ureter: Secondary | ICD-10-CM | POA: Diagnosis not present

## 2017-11-13 DIAGNOSIS — N186 End stage renal disease: Secondary | ICD-10-CM | POA: Diagnosis not present

## 2017-11-14 DIAGNOSIS — E876 Hypokalemia: Secondary | ICD-10-CM | POA: Diagnosis not present

## 2017-11-14 DIAGNOSIS — N2581 Secondary hyperparathyroidism of renal origin: Secondary | ICD-10-CM | POA: Diagnosis not present

## 2017-11-14 DIAGNOSIS — D631 Anemia in chronic kidney disease: Secondary | ICD-10-CM | POA: Diagnosis not present

## 2017-11-14 DIAGNOSIS — N186 End stage renal disease: Secondary | ICD-10-CM | POA: Diagnosis not present

## 2017-11-16 DIAGNOSIS — D631 Anemia in chronic kidney disease: Secondary | ICD-10-CM | POA: Diagnosis not present

## 2017-11-16 DIAGNOSIS — E876 Hypokalemia: Secondary | ICD-10-CM | POA: Diagnosis not present

## 2017-11-16 DIAGNOSIS — N2581 Secondary hyperparathyroidism of renal origin: Secondary | ICD-10-CM | POA: Diagnosis not present

## 2017-11-16 DIAGNOSIS — N186 End stage renal disease: Secondary | ICD-10-CM | POA: Diagnosis not present

## 2017-11-19 DIAGNOSIS — D631 Anemia in chronic kidney disease: Secondary | ICD-10-CM | POA: Diagnosis not present

## 2017-11-19 DIAGNOSIS — N186 End stage renal disease: Secondary | ICD-10-CM | POA: Diagnosis not present

## 2017-11-19 DIAGNOSIS — N2581 Secondary hyperparathyroidism of renal origin: Secondary | ICD-10-CM | POA: Diagnosis not present

## 2017-11-19 DIAGNOSIS — E876 Hypokalemia: Secondary | ICD-10-CM | POA: Diagnosis not present

## 2017-11-21 DIAGNOSIS — D631 Anemia in chronic kidney disease: Secondary | ICD-10-CM | POA: Diagnosis not present

## 2017-11-21 DIAGNOSIS — N2581 Secondary hyperparathyroidism of renal origin: Secondary | ICD-10-CM | POA: Diagnosis not present

## 2017-11-21 DIAGNOSIS — E876 Hypokalemia: Secondary | ICD-10-CM | POA: Diagnosis not present

## 2017-11-21 DIAGNOSIS — N186 End stage renal disease: Secondary | ICD-10-CM | POA: Diagnosis not present

## 2017-11-22 DIAGNOSIS — I7300 Raynaud's syndrome without gangrene: Secondary | ICD-10-CM | POA: Diagnosis not present

## 2017-11-22 DIAGNOSIS — N186 End stage renal disease: Secondary | ICD-10-CM | POA: Diagnosis not present

## 2017-11-22 DIAGNOSIS — L299 Pruritus, unspecified: Secondary | ICD-10-CM | POA: Diagnosis not present

## 2017-11-22 DIAGNOSIS — M1000 Idiopathic gout, unspecified site: Secondary | ICD-10-CM | POA: Diagnosis not present

## 2017-11-22 DIAGNOSIS — R0602 Shortness of breath: Secondary | ICD-10-CM | POA: Diagnosis not present

## 2017-11-22 DIAGNOSIS — M349 Systemic sclerosis, unspecified: Secondary | ICD-10-CM | POA: Diagnosis not present

## 2017-11-22 DIAGNOSIS — E663 Overweight: Secondary | ICD-10-CM | POA: Diagnosis not present

## 2017-11-22 DIAGNOSIS — Z6825 Body mass index (BMI) 25.0-25.9, adult: Secondary | ICD-10-CM | POA: Diagnosis not present

## 2017-11-23 DIAGNOSIS — E876 Hypokalemia: Secondary | ICD-10-CM | POA: Diagnosis not present

## 2017-11-23 DIAGNOSIS — N186 End stage renal disease: Secondary | ICD-10-CM | POA: Diagnosis not present

## 2017-11-23 DIAGNOSIS — D631 Anemia in chronic kidney disease: Secondary | ICD-10-CM | POA: Diagnosis not present

## 2017-11-23 DIAGNOSIS — N2581 Secondary hyperparathyroidism of renal origin: Secondary | ICD-10-CM | POA: Diagnosis not present

## 2017-11-26 DIAGNOSIS — N186 End stage renal disease: Secondary | ICD-10-CM | POA: Diagnosis not present

## 2017-11-26 DIAGNOSIS — D631 Anemia in chronic kidney disease: Secondary | ICD-10-CM | POA: Diagnosis not present

## 2017-11-26 DIAGNOSIS — N2581 Secondary hyperparathyroidism of renal origin: Secondary | ICD-10-CM | POA: Diagnosis not present

## 2017-11-26 DIAGNOSIS — E876 Hypokalemia: Secondary | ICD-10-CM | POA: Diagnosis not present

## 2017-11-28 DIAGNOSIS — E876 Hypokalemia: Secondary | ICD-10-CM | POA: Diagnosis not present

## 2017-11-28 DIAGNOSIS — N2581 Secondary hyperparathyroidism of renal origin: Secondary | ICD-10-CM | POA: Diagnosis not present

## 2017-11-28 DIAGNOSIS — N186 End stage renal disease: Secondary | ICD-10-CM | POA: Diagnosis not present

## 2017-11-28 DIAGNOSIS — D631 Anemia in chronic kidney disease: Secondary | ICD-10-CM | POA: Diagnosis not present

## 2017-11-30 DIAGNOSIS — N186 End stage renal disease: Secondary | ICD-10-CM | POA: Diagnosis not present

## 2017-11-30 DIAGNOSIS — N2581 Secondary hyperparathyroidism of renal origin: Secondary | ICD-10-CM | POA: Diagnosis not present

## 2017-11-30 DIAGNOSIS — E876 Hypokalemia: Secondary | ICD-10-CM | POA: Diagnosis not present

## 2017-11-30 DIAGNOSIS — D631 Anemia in chronic kidney disease: Secondary | ICD-10-CM | POA: Diagnosis not present

## 2017-12-05 DIAGNOSIS — D631 Anemia in chronic kidney disease: Secondary | ICD-10-CM | POA: Diagnosis not present

## 2017-12-05 DIAGNOSIS — E876 Hypokalemia: Secondary | ICD-10-CM | POA: Diagnosis not present

## 2017-12-05 DIAGNOSIS — N186 End stage renal disease: Secondary | ICD-10-CM | POA: Diagnosis not present

## 2017-12-05 DIAGNOSIS — N2581 Secondary hyperparathyroidism of renal origin: Secondary | ICD-10-CM | POA: Diagnosis not present

## 2017-12-07 DIAGNOSIS — N186 End stage renal disease: Secondary | ICD-10-CM | POA: Diagnosis not present

## 2017-12-07 DIAGNOSIS — E876 Hypokalemia: Secondary | ICD-10-CM | POA: Diagnosis not present

## 2017-12-07 DIAGNOSIS — D631 Anemia in chronic kidney disease: Secondary | ICD-10-CM | POA: Diagnosis not present

## 2017-12-07 DIAGNOSIS — N2581 Secondary hyperparathyroidism of renal origin: Secondary | ICD-10-CM | POA: Diagnosis not present

## 2017-12-10 DIAGNOSIS — N186 End stage renal disease: Secondary | ICD-10-CM | POA: Diagnosis not present

## 2017-12-10 DIAGNOSIS — D631 Anemia in chronic kidney disease: Secondary | ICD-10-CM | POA: Diagnosis not present

## 2017-12-10 DIAGNOSIS — E876 Hypokalemia: Secondary | ICD-10-CM | POA: Diagnosis not present

## 2017-12-10 DIAGNOSIS — N2581 Secondary hyperparathyroidism of renal origin: Secondary | ICD-10-CM | POA: Diagnosis not present

## 2017-12-12 DIAGNOSIS — N2581 Secondary hyperparathyroidism of renal origin: Secondary | ICD-10-CM | POA: Diagnosis not present

## 2017-12-12 DIAGNOSIS — D631 Anemia in chronic kidney disease: Secondary | ICD-10-CM | POA: Diagnosis not present

## 2017-12-12 DIAGNOSIS — N186 End stage renal disease: Secondary | ICD-10-CM | POA: Diagnosis not present

## 2017-12-12 DIAGNOSIS — E876 Hypokalemia: Secondary | ICD-10-CM | POA: Diagnosis not present

## 2017-12-14 ENCOUNTER — Telehealth (HOSPITAL_COMMUNITY): Payer: Self-pay | Admitting: *Deleted

## 2017-12-14 DIAGNOSIS — Z992 Dependence on renal dialysis: Secondary | ICD-10-CM | POA: Diagnosis not present

## 2017-12-14 DIAGNOSIS — N2581 Secondary hyperparathyroidism of renal origin: Secondary | ICD-10-CM | POA: Diagnosis not present

## 2017-12-14 DIAGNOSIS — N186 End stage renal disease: Secondary | ICD-10-CM | POA: Diagnosis not present

## 2017-12-14 DIAGNOSIS — I5042 Chronic combined systolic (congestive) and diastolic (congestive) heart failure: Secondary | ICD-10-CM

## 2017-12-14 DIAGNOSIS — N2889 Other specified disorders of kidney and ureter: Secondary | ICD-10-CM | POA: Diagnosis not present

## 2017-12-14 DIAGNOSIS — D631 Anemia in chronic kidney disease: Secondary | ICD-10-CM | POA: Diagnosis not present

## 2017-12-14 NOTE — Telephone Encounter (Signed)
Received referral from Dr Trudie Reed, pt has scleroderma and needs echo, pfts and appt with Dr Haroldine Laws, orders placed will schedule

## 2017-12-17 DIAGNOSIS — N2581 Secondary hyperparathyroidism of renal origin: Secondary | ICD-10-CM | POA: Diagnosis not present

## 2017-12-17 DIAGNOSIS — D631 Anemia in chronic kidney disease: Secondary | ICD-10-CM | POA: Diagnosis not present

## 2017-12-17 DIAGNOSIS — N186 End stage renal disease: Secondary | ICD-10-CM | POA: Diagnosis not present

## 2017-12-19 ENCOUNTER — Other Ambulatory Visit: Payer: Self-pay | Admitting: Rheumatology

## 2017-12-19 DIAGNOSIS — D631 Anemia in chronic kidney disease: Secondary | ICD-10-CM | POA: Diagnosis not present

## 2017-12-19 DIAGNOSIS — R0602 Shortness of breath: Secondary | ICD-10-CM

## 2017-12-19 DIAGNOSIS — M349 Systemic sclerosis, unspecified: Secondary | ICD-10-CM

## 2017-12-19 DIAGNOSIS — N186 End stage renal disease: Secondary | ICD-10-CM | POA: Diagnosis not present

## 2017-12-19 DIAGNOSIS — N2581 Secondary hyperparathyroidism of renal origin: Secondary | ICD-10-CM | POA: Diagnosis not present

## 2017-12-21 DIAGNOSIS — D631 Anemia in chronic kidney disease: Secondary | ICD-10-CM | POA: Diagnosis not present

## 2017-12-21 DIAGNOSIS — N186 End stage renal disease: Secondary | ICD-10-CM | POA: Diagnosis not present

## 2017-12-21 DIAGNOSIS — N2581 Secondary hyperparathyroidism of renal origin: Secondary | ICD-10-CM | POA: Diagnosis not present

## 2017-12-24 DIAGNOSIS — N186 End stage renal disease: Secondary | ICD-10-CM | POA: Diagnosis not present

## 2017-12-24 DIAGNOSIS — N2581 Secondary hyperparathyroidism of renal origin: Secondary | ICD-10-CM | POA: Diagnosis not present

## 2017-12-24 DIAGNOSIS — D631 Anemia in chronic kidney disease: Secondary | ICD-10-CM | POA: Diagnosis not present

## 2017-12-25 ENCOUNTER — Ambulatory Visit
Admission: RE | Admit: 2017-12-25 | Discharge: 2017-12-25 | Disposition: A | Discharge status: Home / Self Care | Payer: Medicare Other | Source: Ambulatory Visit | Attending: Rheumatology | Admitting: Rheumatology

## 2017-12-25 ENCOUNTER — Telehealth: Payer: Self-pay | Admitting: Internal Medicine

## 2017-12-25 DIAGNOSIS — M349 Systemic sclerosis, unspecified: Secondary | ICD-10-CM

## 2017-12-25 DIAGNOSIS — R0602 Shortness of breath: Secondary | ICD-10-CM

## 2017-12-25 DIAGNOSIS — J439 Emphysema, unspecified: Secondary | ICD-10-CM | POA: Diagnosis not present

## 2017-12-25 NOTE — Telephone Encounter (Signed)
I left a message asking the pt to call and schedule follow up appt w/ Janece ( no showed 10/12/17 appt for htn). VDM (DD)

## 2017-12-28 DIAGNOSIS — D631 Anemia in chronic kidney disease: Secondary | ICD-10-CM | POA: Diagnosis not present

## 2017-12-28 DIAGNOSIS — N186 End stage renal disease: Secondary | ICD-10-CM | POA: Diagnosis not present

## 2017-12-28 DIAGNOSIS — N2581 Secondary hyperparathyroidism of renal origin: Secondary | ICD-10-CM | POA: Diagnosis not present

## 2017-12-31 DIAGNOSIS — D631 Anemia in chronic kidney disease: Secondary | ICD-10-CM | POA: Diagnosis not present

## 2017-12-31 DIAGNOSIS — N2581 Secondary hyperparathyroidism of renal origin: Secondary | ICD-10-CM | POA: Diagnosis not present

## 2017-12-31 DIAGNOSIS — N186 End stage renal disease: Secondary | ICD-10-CM | POA: Diagnosis not present

## 2018-01-01 ENCOUNTER — Telehealth: Payer: Self-pay

## 2018-01-01 NOTE — Telephone Encounter (Signed)
Returned pt call regarding her v/m stating she was returning our call. Left v/m for her to call office so we can reschedule her [place]. YRL,RMA

## 2018-01-02 DIAGNOSIS — N2581 Secondary hyperparathyroidism of renal origin: Secondary | ICD-10-CM | POA: Diagnosis not present

## 2018-01-02 DIAGNOSIS — D631 Anemia in chronic kidney disease: Secondary | ICD-10-CM | POA: Diagnosis not present

## 2018-01-02 DIAGNOSIS — N186 End stage renal disease: Secondary | ICD-10-CM | POA: Diagnosis not present

## 2018-01-03 ENCOUNTER — Telehealth: Payer: Self-pay | Admitting: Internal Medicine

## 2018-01-03 ENCOUNTER — Encounter: Payer: Self-pay | Admitting: Internal Medicine

## 2018-01-03 NOTE — Telephone Encounter (Signed)
New referral received from Dr. Gavin Pound for lung mass. Pt has been cld and scheduled to see Dr. Julien Nordmann on 12/3 at 215pm. Unable to reach the pt. Letter mailed with the appt date and time.

## 2018-01-04 DIAGNOSIS — N186 End stage renal disease: Secondary | ICD-10-CM | POA: Diagnosis not present

## 2018-01-04 DIAGNOSIS — D631 Anemia in chronic kidney disease: Secondary | ICD-10-CM | POA: Diagnosis not present

## 2018-01-04 DIAGNOSIS — N2581 Secondary hyperparathyroidism of renal origin: Secondary | ICD-10-CM | POA: Diagnosis not present

## 2018-01-06 DIAGNOSIS — N186 End stage renal disease: Secondary | ICD-10-CM | POA: Diagnosis not present

## 2018-01-06 DIAGNOSIS — D631 Anemia in chronic kidney disease: Secondary | ICD-10-CM | POA: Diagnosis not present

## 2018-01-06 DIAGNOSIS — N2581 Secondary hyperparathyroidism of renal origin: Secondary | ICD-10-CM | POA: Diagnosis not present

## 2018-01-08 DIAGNOSIS — N186 End stage renal disease: Secondary | ICD-10-CM | POA: Diagnosis not present

## 2018-01-08 DIAGNOSIS — D631 Anemia in chronic kidney disease: Secondary | ICD-10-CM | POA: Diagnosis not present

## 2018-01-08 DIAGNOSIS — N2581 Secondary hyperparathyroidism of renal origin: Secondary | ICD-10-CM | POA: Diagnosis not present

## 2018-01-11 DIAGNOSIS — N186 End stage renal disease: Secondary | ICD-10-CM | POA: Diagnosis not present

## 2018-01-11 DIAGNOSIS — N2581 Secondary hyperparathyroidism of renal origin: Secondary | ICD-10-CM | POA: Diagnosis not present

## 2018-01-11 DIAGNOSIS — D631 Anemia in chronic kidney disease: Secondary | ICD-10-CM | POA: Diagnosis not present

## 2018-01-13 DIAGNOSIS — Z992 Dependence on renal dialysis: Secondary | ICD-10-CM | POA: Diagnosis not present

## 2018-01-13 DIAGNOSIS — N2889 Other specified disorders of kidney and ureter: Secondary | ICD-10-CM | POA: Diagnosis not present

## 2018-01-13 DIAGNOSIS — N186 End stage renal disease: Secondary | ICD-10-CM | POA: Diagnosis not present

## 2018-01-14 ENCOUNTER — Other Ambulatory Visit: Payer: Self-pay | Admitting: *Deleted

## 2018-01-14 DIAGNOSIS — D509 Iron deficiency anemia, unspecified: Secondary | ICD-10-CM | POA: Diagnosis not present

## 2018-01-14 DIAGNOSIS — N2581 Secondary hyperparathyroidism of renal origin: Secondary | ICD-10-CM | POA: Diagnosis not present

## 2018-01-14 DIAGNOSIS — N186 End stage renal disease: Secondary | ICD-10-CM | POA: Diagnosis not present

## 2018-01-14 DIAGNOSIS — D6489 Other specified anemias: Secondary | ICD-10-CM

## 2018-01-15 ENCOUNTER — Inpatient Hospital Stay: Payer: Medicare Other | Admitting: Internal Medicine

## 2018-01-15 ENCOUNTER — Inpatient Hospital Stay: Payer: Medicare Other

## 2018-01-18 DIAGNOSIS — N186 End stage renal disease: Secondary | ICD-10-CM | POA: Diagnosis not present

## 2018-01-18 DIAGNOSIS — D509 Iron deficiency anemia, unspecified: Secondary | ICD-10-CM | POA: Diagnosis not present

## 2018-01-18 DIAGNOSIS — N2581 Secondary hyperparathyroidism of renal origin: Secondary | ICD-10-CM | POA: Diagnosis not present

## 2018-01-21 DIAGNOSIS — N186 End stage renal disease: Secondary | ICD-10-CM | POA: Diagnosis not present

## 2018-01-21 DIAGNOSIS — N2581 Secondary hyperparathyroidism of renal origin: Secondary | ICD-10-CM | POA: Diagnosis not present

## 2018-01-21 DIAGNOSIS — D509 Iron deficiency anemia, unspecified: Secondary | ICD-10-CM | POA: Diagnosis not present

## 2018-01-23 DIAGNOSIS — N2581 Secondary hyperparathyroidism of renal origin: Secondary | ICD-10-CM | POA: Diagnosis not present

## 2018-01-23 DIAGNOSIS — D509 Iron deficiency anemia, unspecified: Secondary | ICD-10-CM | POA: Diagnosis not present

## 2018-01-23 DIAGNOSIS — N186 End stage renal disease: Secondary | ICD-10-CM | POA: Diagnosis not present

## 2018-01-25 DIAGNOSIS — D509 Iron deficiency anemia, unspecified: Secondary | ICD-10-CM | POA: Diagnosis not present

## 2018-01-25 DIAGNOSIS — N186 End stage renal disease: Secondary | ICD-10-CM | POA: Diagnosis not present

## 2018-01-25 DIAGNOSIS — N2581 Secondary hyperparathyroidism of renal origin: Secondary | ICD-10-CM | POA: Diagnosis not present

## 2018-01-28 DIAGNOSIS — N2581 Secondary hyperparathyroidism of renal origin: Secondary | ICD-10-CM | POA: Diagnosis not present

## 2018-01-28 DIAGNOSIS — N186 End stage renal disease: Secondary | ICD-10-CM | POA: Diagnosis not present

## 2018-01-28 DIAGNOSIS — D509 Iron deficiency anemia, unspecified: Secondary | ICD-10-CM | POA: Diagnosis not present

## 2018-01-30 DIAGNOSIS — D509 Iron deficiency anemia, unspecified: Secondary | ICD-10-CM | POA: Diagnosis not present

## 2018-01-30 DIAGNOSIS — N2581 Secondary hyperparathyroidism of renal origin: Secondary | ICD-10-CM | POA: Diagnosis not present

## 2018-01-30 DIAGNOSIS — N186 End stage renal disease: Secondary | ICD-10-CM | POA: Diagnosis not present

## 2018-02-01 DIAGNOSIS — D509 Iron deficiency anemia, unspecified: Secondary | ICD-10-CM | POA: Diagnosis not present

## 2018-02-01 DIAGNOSIS — N186 End stage renal disease: Secondary | ICD-10-CM | POA: Diagnosis not present

## 2018-02-01 DIAGNOSIS — N2581 Secondary hyperparathyroidism of renal origin: Secondary | ICD-10-CM | POA: Diagnosis not present

## 2018-02-03 DIAGNOSIS — N186 End stage renal disease: Secondary | ICD-10-CM | POA: Diagnosis not present

## 2018-02-03 DIAGNOSIS — D509 Iron deficiency anemia, unspecified: Secondary | ICD-10-CM | POA: Diagnosis not present

## 2018-02-03 DIAGNOSIS — N2581 Secondary hyperparathyroidism of renal origin: Secondary | ICD-10-CM | POA: Diagnosis not present

## 2018-02-04 ENCOUNTER — Inpatient Hospital Stay: Payer: Medicare Other

## 2018-02-04 ENCOUNTER — Inpatient Hospital Stay: Payer: Medicare Other | Attending: Internal Medicine | Admitting: Internal Medicine

## 2018-02-04 ENCOUNTER — Encounter: Payer: Self-pay | Admitting: Internal Medicine

## 2018-02-04 ENCOUNTER — Telehealth: Payer: Self-pay | Admitting: Internal Medicine

## 2018-02-04 VITALS — BP 143/85 | HR 66 | Temp 97.7°F | Resp 17 | Ht 70.0 in | Wt 164.5 lb

## 2018-02-04 DIAGNOSIS — I252 Old myocardial infarction: Secondary | ICD-10-CM | POA: Diagnosis not present

## 2018-02-04 DIAGNOSIS — N186 End stage renal disease: Secondary | ICD-10-CM | POA: Diagnosis not present

## 2018-02-04 DIAGNOSIS — Z79899 Other long term (current) drug therapy: Secondary | ICD-10-CM | POA: Diagnosis not present

## 2018-02-04 DIAGNOSIS — J849 Interstitial pulmonary disease, unspecified: Secondary | ICD-10-CM | POA: Insufficient documentation

## 2018-02-04 DIAGNOSIS — F17210 Nicotine dependence, cigarettes, uncomplicated: Secondary | ICD-10-CM | POA: Diagnosis not present

## 2018-02-04 DIAGNOSIS — E785 Hyperlipidemia, unspecified: Secondary | ICD-10-CM | POA: Diagnosis not present

## 2018-02-04 DIAGNOSIS — M349 Systemic sclerosis, unspecified: Secondary | ICD-10-CM | POA: Insufficient documentation

## 2018-02-04 DIAGNOSIS — I132 Hypertensive heart and chronic kidney disease with heart failure and with stage 5 chronic kidney disease, or end stage renal disease: Secondary | ICD-10-CM | POA: Diagnosis not present

## 2018-02-04 DIAGNOSIS — Z992 Dependence on renal dialysis: Secondary | ICD-10-CM

## 2018-02-04 DIAGNOSIS — I509 Heart failure, unspecified: Secondary | ICD-10-CM | POA: Diagnosis not present

## 2018-02-04 DIAGNOSIS — Z7982 Long term (current) use of aspirin: Secondary | ICD-10-CM | POA: Diagnosis not present

## 2018-02-04 DIAGNOSIS — E669 Obesity, unspecified: Secondary | ICD-10-CM | POA: Insufficient documentation

## 2018-02-04 DIAGNOSIS — F17200 Nicotine dependence, unspecified, uncomplicated: Secondary | ICD-10-CM

## 2018-02-04 DIAGNOSIS — R911 Solitary pulmonary nodule: Secondary | ICD-10-CM | POA: Diagnosis not present

## 2018-02-04 DIAGNOSIS — K219 Gastro-esophageal reflux disease without esophagitis: Secondary | ICD-10-CM | POA: Insufficient documentation

## 2018-02-04 DIAGNOSIS — M109 Gout, unspecified: Secondary | ICD-10-CM | POA: Diagnosis not present

## 2018-02-04 DIAGNOSIS — I10 Essential (primary) hypertension: Secondary | ICD-10-CM

## 2018-02-04 DIAGNOSIS — C3490 Malignant neoplasm of unspecified part of unspecified bronchus or lung: Secondary | ICD-10-CM

## 2018-02-04 DIAGNOSIS — D6489 Other specified anemias: Secondary | ICD-10-CM

## 2018-02-04 LAB — CBC WITH DIFFERENTIAL (CANCER CENTER ONLY)
ABS IMMATURE GRANULOCYTES: 0.01 10*3/uL (ref 0.00–0.07)
BASOS PCT: 1 %
Basophils Absolute: 0 10*3/uL (ref 0.0–0.1)
EOS PCT: 2 %
Eosinophils Absolute: 0.1 10*3/uL (ref 0.0–0.5)
HCT: 35.5 % — ABNORMAL LOW (ref 36.0–46.0)
Hemoglobin: 11.5 g/dL — ABNORMAL LOW (ref 12.0–15.0)
Immature Granulocytes: 0 %
Lymphocytes Relative: 24 %
Lymphs Abs: 1.1 10*3/uL (ref 0.7–4.0)
MCH: 32 pg (ref 26.0–34.0)
MCHC: 32.4 g/dL (ref 30.0–36.0)
MCV: 98.9 fL (ref 80.0–100.0)
MONO ABS: 0.6 10*3/uL (ref 0.1–1.0)
Monocytes Relative: 12 %
NEUTROS ABS: 2.8 10*3/uL (ref 1.7–7.7)
Neutrophils Relative %: 61 %
PLATELETS: 100 10*3/uL — AB (ref 150–400)
RBC: 3.59 MIL/uL — AB (ref 3.87–5.11)
RDW: 15.6 % — ABNORMAL HIGH (ref 11.5–15.5)
WBC: 4.6 10*3/uL (ref 4.0–10.5)
nRBC: 0 % (ref 0.0–0.2)

## 2018-02-04 LAB — CMP (CANCER CENTER ONLY)
ALT: 14 U/L (ref 0–44)
ANION GAP: 13 (ref 5–15)
AST: 23 U/L (ref 15–41)
Albumin: 3.1 g/dL — ABNORMAL LOW (ref 3.5–5.0)
Alkaline Phosphatase: 171 U/L — ABNORMAL HIGH (ref 38–126)
BUN: 22 mg/dL (ref 8–23)
CHLORIDE: 94 mmol/L — AB (ref 98–111)
CO2: 30 mmol/L (ref 22–32)
Calcium: 10.1 mg/dL (ref 8.9–10.3)
Creatinine: 4.4 mg/dL (ref 0.44–1.00)
GFR, EST AFRICAN AMERICAN: 11 mL/min — AB (ref >60)
GFR, Estimated: 10 mL/min — ABNORMAL LOW (ref >60)
Glucose, Bld: 85 mg/dL (ref 70–99)
POTASSIUM: 3.6 mmol/L (ref 3.5–5.1)
SODIUM: 137 mmol/L (ref 135–145)
Total Bilirubin: 1 mg/dL (ref 0.3–1.2)
Total Protein: 8.1 g/dL (ref 6.5–8.1)

## 2018-02-04 NOTE — Progress Notes (Signed)
[place] [phone]   [phone]  CONSULT NOTE  REFERRING PHYSICIAN: Dr. Gavin Pound  REASON FOR CONSULTATION:  67 years old African-American female with suspicious lung nodule.  HPI Jessica Clarke is a 67 y.o. female with past medical history significant for multiple medical problems including history of myocardial infarction, congestive heart failure, end-stage renal disease and currently on hemodialysis, gout, GERD, anemia, dyslipidemia, hypertension as well as scleroderma and cervical dysplasia.  The patient also has a long history of smoking.  She was evaluated by Dr. Trudie Reed for scleroderma increasing itching as well as scratching in her upper extremities as well as shoulder and back.  The patient was complaining of shortness of breath and cough.  She had CT scan of the chest performed on December 25, 2017 and it showed spiculated nodule in the apical segment right upper lobe measuring 1.6 x 2.1 cm.  There was also mediastinal lymph node measuring up to 1.3 cm and the low right paratracheal station.  There was also mild patchy bilateral subpleural groundglass that appears new. The patient was referred to me today for further evaluation and recommendation regarding these abnormalities on the CT scan of the chest. When seen today the patient continues to complain of itching and scratching of her skin secondary to scleroderma.  She also has shortness of breath and cough productive of clear sputum but no significant chest pain or hemoptysis.  She has no nausea, vomiting, constipation but few episodes of diarrhea.  She denied having any significant weight loss or night sweats.  She has blurry vision but no double vision or headaches. Family history significant for mother still alive at age 47 and has high blood pressure, sister had esophageal cancer and another sister with diabetes mellitus. The patient is divorced and has 1 daughter.  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  She has a history of smoking 1 pack/day for around 44 years and unfortunately she continues to smoke.  She has no history of alcohol or drug abuse.  HPI  Past Medical History:  Diagnosis Date  . Abnormal Pap smear    ASCUS ?HGSIL  . Anemia   . Cervical dysplasia   . CHF (congestive heart failure) ([place])   . Chronic kidney disease   . Constipation   . Constipation   . GERD (gastroesophageal reflux disease)   . Gout   . Hyperlipidemia   . Hypertension   . Myocardial infarction ([place]) 2015  . Obesity   . Pneumonia   . Uterine polyp     Past Surgical History:  Procedure Laterality Date  . AV FISTULA PLACEMENT  01/18/2012   Procedure: ARTERIOVENOUS (AV) FISTULA CREATION;  Surgeon: Angelia Mould, MD;  Location: [facility];  Service: Vascular;  Laterality: Left;  . DILATION AND CURETTAGE OF UTERUS    . HYSTEROSCOPY    . LEEP    . LEFT HEART CATHETERIZATION WITH CORONARY ANGIOGRAM N/A 08/26/2013   Procedure: LEFT HEART CATHETERIZATION WITH CORONARY ANGIOGRAM;  Surgeon: Leonie Man, MD;  Location[facility] C[facility] LAB;  Service: Cardiovascular;  Laterality: N/A;  . REVISON OF ARTERIOVENOUS FISTULA Left 5/99/3570   Procedure: PLICATION OF ARTERIOVENOUS FISTULA;  Surgeon: Conrad Broken Bow, MD;  Location: [facility];  Service: Vascular;  Laterality: Left;    Family History  Problem Relation Age of Onset  . Esophageal cancer Unknown   . Pancreatic cancer Unknown   . Heart disease Mother   . Hypertension  Mother   . Breast cancer Sister   . Esophageal cancer Sister   . Cancer Sister   . Deep vein thrombosis Sister   . Diabetes Sister   . Hypertension Sister   . Hypertension Daughter   . Colon cancer Neg Hx     Social History Social History   Tobacco Use  . Smoking status: Current Every Day Smoker    Packs/day: 0.50    Years: 30.00    Pack years: 15.00    Types: Cigarettes  . Smokeless tobacco: Never Used  Substance Use Topics  . Alcohol use: Yes     Alcohol/week: 6.0 standard drinks    Types: 6 Cans of beer per week  . Drug use: No    Allergies  Allergen Reactions  . No Known Allergies     Current Outpatient Medications  Medication Sig Dispense Refill  . allopurinol (ZYLOPRIM) 100 MG tablet Take 100 mg by mouth daily.      Marland Kitchen aspirin EC 325 MG tablet Take 1 tablet (325 mg total) by mouth daily. 30 tablet 0  . atenolol (TENORMIN) 100 MG tablet Take 1 tablet (100 mg total) by mouth daily. 60 tablet 0  . atorvastatin (LIPITOR) 10 MG tablet   0  . doxycycline (VIBRA-TABS) 100 MG tablet Take 1 tablet (100 mg total) by mouth every 12 (twelve) hours. 8 tablet 0  . Multiple Vitamin (MULTIVITAMIN WITH MINERALS) TABS tablet Take 1 tablet by mouth daily. 30 tablet 0  . Nutritional Supplements (FEEDING SUPPLEMENT, NEPRO CARB STEADY,) LIQD Take 237 mLs by mouth 2 (two) times daily between meals. 60 Can 0  . VELPHORO 500 MG chewable tablet Chew 2 tablets by mouth 3 (three) times daily.     No current facility-administered medications for this visit.     Review of Systems  Constitutional: positive for fatigue Eyes: negative Ears, nose, mouth, throat, and face: negative Respiratory: positive for cough, dyspnea on exertion and sputum Cardiovascular: negative Gastrointestinal: negative Genitourinary:negative Integument/breast: positive for dryness, pruritus and rash Hematologic/lymphatic: negative Musculoskeletal:negative Neurological: negative Behavioral/Psych: negative Endocrine: negative Allergic/Immunologic: negative  Physical Exam  OMV:EHMCN, healthy, no distress, well nourished, well developed and anxious SKIN: skin color, texture, turgor are normal, no rashes or significant lesions HEAD: Normocephalic, No masses, lesions, tenderness or abnormalities EYES: normal, PERRLA, Conjunctiva are pink and non-injected EARS: External ears normal, Canals clear OROPHARYNX:no exudate, no erythema and lips, buccal mucosa, and tongue normal    NECK: supple, no adenopathy, no JVD LYMPH:  no palpable lymphadenopathy, no hepatosplenomegaly BREAST:not examined LUNGS: clear to auscultation , and palpation HEART: regular rate & rhythm, no murmurs and no gallops ABDOMEN:abdomen soft, non-tender, normal bowel sounds and no masses or organomegaly BACK: Back symmetric, no curvature., No CVA tenderness EXTREMITIES:no joint deformities, effusion, or inflammation, no edema  NEURO: alert & oriented x 3 with fluent speech, no focal motor/sensory deficits  PERFORMANCE STATUS: ECOG 1  LABORATORY DATA: Lab Results  Component Value Date   WBC 4.6 02/04/2018   HGB 11.5 (L) 02/04/2018   HCT 35.5 (L) 02/04/2018   MCV 98.9 02/04/2018   PLT 100 (L) 02/04/2018      Chemistry      Component Value Date/Time   NA 137 02/04/2018 1507   K 3.6 02/04/2018 1507   CL 94 (L) 02/04/2018 1507   CO2 30 02/04/2018 1507   BUN 22 02/04/2018 1507   CREATININE 4.40 (HH) 02/04/2018 1507      Component Value Date/Time   CALCIUM 10.1  02/04/2018 1507   CALCIUM 9.0 10/22/2009 2253   ALKPHOS 171 (H) 02/04/2018 1507   AST 23 02/04/2018 1507   ALT 14 02/04/2018 1507   BILITOT 1.0 02/04/2018 1507       RADIOGRAPHIC STUDIES: No results found.  ASSESSMENT: This is a very pleasant 67 years old African-American female presented for evaluation of spiculated right upper lobe pulmonary nodule suspicious for lung cancer.  The patient also has a history of scleroderma and she has patchy areas of interstitial lung disease.   PLAN: I had a lengthy discussion with the patient today about her current condition and further investigation to confirm her diagnosis. I personally and independently reviewed the scan images and discussed the result and showed the images to the patient today. I recommended for the patient to have a PET scan for further evaluation of this pulmonary nodules and if positive on the PET scan would consider the patient for either biopsy and  stereotactic radiotherapy or referral to cardiothoracic surgery for resection if she has appropriate pulmonary and cardiac function test. I will see the patient back for follow-up visit in 2-3 weeks for reevaluation and more detailed discussion of her treatment options based on the pending imaging studies. The patient was advised to call immediately if she has any concerning symptoms in the interval. For smoking cessation I strongly encouraged the patient to quit smoking. For the end-stage renal disease, she will continue her current hemodialysis under the care of Dr. Carmina Miller. The patient voices understanding of current disease status and treatment options and is in agreement with the current care plan. All questions were answered. The patient knows to call the clinic with any problems, questions or concerns. We can certainly see the patient much sooner if necessary.  Thank you so much for allowing me to participate in the care of Jessica Clarke. I will continue to follow up the patient with you and assist in her care.  I spent 40 minutes counseling the patient face to face. The total time spent in the appointment was 60 minutes.  Disclaimer: This note was dictated with voice recognition software. Similar sounding words can inadvertently be transcribed and may not be corrected upon review.   Eilleen Kempf February 04, 2018, 4:14 PM

## 2018-02-04 NOTE — Telephone Encounter (Signed)
Scheduled appt per 12/23 los - gave patient AVS and calender per los.

## 2018-02-05 DIAGNOSIS — N2581 Secondary hyperparathyroidism of renal origin: Secondary | ICD-10-CM | POA: Diagnosis not present

## 2018-02-05 DIAGNOSIS — D509 Iron deficiency anemia, unspecified: Secondary | ICD-10-CM | POA: Diagnosis not present

## 2018-02-05 DIAGNOSIS — N186 End stage renal disease: Secondary | ICD-10-CM | POA: Diagnosis not present

## 2018-02-08 DIAGNOSIS — N186 End stage renal disease: Secondary | ICD-10-CM | POA: Diagnosis not present

## 2018-02-08 DIAGNOSIS — N2581 Secondary hyperparathyroidism of renal origin: Secondary | ICD-10-CM | POA: Diagnosis not present

## 2018-02-08 DIAGNOSIS — D509 Iron deficiency anemia, unspecified: Secondary | ICD-10-CM | POA: Diagnosis not present

## 2018-02-12 DIAGNOSIS — N186 End stage renal disease: Secondary | ICD-10-CM | POA: Diagnosis not present

## 2018-02-12 DIAGNOSIS — N2581 Secondary hyperparathyroidism of renal origin: Secondary | ICD-10-CM | POA: Diagnosis not present

## 2018-02-12 DIAGNOSIS — D509 Iron deficiency anemia, unspecified: Secondary | ICD-10-CM | POA: Diagnosis not present

## 2018-02-13 DIAGNOSIS — Z992 Dependence on renal dialysis: Secondary | ICD-10-CM | POA: Diagnosis not present

## 2018-02-13 DIAGNOSIS — N186 End stage renal disease: Secondary | ICD-10-CM | POA: Diagnosis not present

## 2018-02-13 DIAGNOSIS — N2889 Other specified disorders of kidney and ureter: Secondary | ICD-10-CM | POA: Diagnosis not present

## 2018-02-15 DIAGNOSIS — N2581 Secondary hyperparathyroidism of renal origin: Secondary | ICD-10-CM | POA: Diagnosis not present

## 2018-02-15 DIAGNOSIS — D631 Anemia in chronic kidney disease: Secondary | ICD-10-CM | POA: Diagnosis not present

## 2018-02-15 DIAGNOSIS — N186 End stage renal disease: Secondary | ICD-10-CM | POA: Diagnosis not present

## 2018-02-18 DIAGNOSIS — N2581 Secondary hyperparathyroidism of renal origin: Secondary | ICD-10-CM | POA: Diagnosis not present

## 2018-02-18 DIAGNOSIS — N186 End stage renal disease: Secondary | ICD-10-CM | POA: Diagnosis not present

## 2018-02-18 DIAGNOSIS — D631 Anemia in chronic kidney disease: Secondary | ICD-10-CM | POA: Diagnosis not present

## 2018-02-20 DIAGNOSIS — D631 Anemia in chronic kidney disease: Secondary | ICD-10-CM | POA: Diagnosis not present

## 2018-02-20 DIAGNOSIS — N2581 Secondary hyperparathyroidism of renal origin: Secondary | ICD-10-CM | POA: Diagnosis not present

## 2018-02-20 DIAGNOSIS — N186 End stage renal disease: Secondary | ICD-10-CM | POA: Diagnosis not present

## 2018-02-22 DIAGNOSIS — N186 End stage renal disease: Secondary | ICD-10-CM | POA: Diagnosis not present

## 2018-02-22 DIAGNOSIS — D631 Anemia in chronic kidney disease: Secondary | ICD-10-CM | POA: Diagnosis not present

## 2018-02-22 DIAGNOSIS — N2581 Secondary hyperparathyroidism of renal origin: Secondary | ICD-10-CM | POA: Diagnosis not present

## 2018-02-25 DIAGNOSIS — N2581 Secondary hyperparathyroidism of renal origin: Secondary | ICD-10-CM | POA: Diagnosis not present

## 2018-02-25 DIAGNOSIS — N186 End stage renal disease: Secondary | ICD-10-CM | POA: Diagnosis not present

## 2018-02-25 DIAGNOSIS — D631 Anemia in chronic kidney disease: Secondary | ICD-10-CM | POA: Diagnosis not present

## 2018-02-26 ENCOUNTER — Ambulatory Visit (HOSPITAL_COMMUNITY)
Admission: RE | Admit: 2018-02-26 | Discharge: 2018-02-26 | Disposition: A | Discharge status: Home / Self Care | Payer: Medicare Other | Source: Ambulatory Visit | Attending: Internal Medicine | Admitting: Internal Medicine

## 2018-02-26 DIAGNOSIS — C3490 Malignant neoplasm of unspecified part of unspecified bronchus or lung: Secondary | ICD-10-CM | POA: Insufficient documentation

## 2018-02-26 DIAGNOSIS — C3411 Malignant neoplasm of upper lobe, right bronchus or lung: Secondary | ICD-10-CM | POA: Diagnosis not present

## 2018-02-26 LAB — GLUCOSE, CAPILLARY: Glucose-Capillary: 92 mg/dL (ref 70–99)

## 2018-02-26 MED ORDER — FLUDEOXYGLUCOSE F - 18 (FDG) INJECTION
8.2000 | Freq: Once | INTRAVENOUS | Status: AC | PRN
  Administered 2018-02-26: 8.2 via INTRAVENOUS

## 2018-02-27 DIAGNOSIS — N186 End stage renal disease: Secondary | ICD-10-CM | POA: Diagnosis not present

## 2018-02-27 DIAGNOSIS — N2581 Secondary hyperparathyroidism of renal origin: Secondary | ICD-10-CM | POA: Diagnosis not present

## 2018-02-27 DIAGNOSIS — D631 Anemia in chronic kidney disease: Secondary | ICD-10-CM | POA: Diagnosis not present

## 2018-03-01 DIAGNOSIS — D631 Anemia in chronic kidney disease: Secondary | ICD-10-CM | POA: Diagnosis not present

## 2018-03-01 DIAGNOSIS — N186 End stage renal disease: Secondary | ICD-10-CM | POA: Diagnosis not present

## 2018-03-01 DIAGNOSIS — N2581 Secondary hyperparathyroidism of renal origin: Secondary | ICD-10-CM | POA: Diagnosis not present

## 2018-03-04 ENCOUNTER — Inpatient Hospital Stay (HOSPITAL_COMMUNITY): Admission: RE | Admit: 2018-03-04 | Payer: Medicare Other | Source: Ambulatory Visit | Admitting: Internal Medicine

## 2018-03-04 ENCOUNTER — Ambulatory Visit (HOSPITAL_COMMUNITY): Admission: RE | Admit: 2018-03-04 | Payer: Medicare Other | Source: Ambulatory Visit

## 2018-03-04 ENCOUNTER — Inpatient Hospital Stay (HOSPITAL_COMMUNITY): Admission: RE | Admit: 2018-03-04 | Payer: Medicare Other | Source: Ambulatory Visit

## 2018-03-04 DIAGNOSIS — D631 Anemia in chronic kidney disease: Secondary | ICD-10-CM | POA: Diagnosis not present

## 2018-03-04 DIAGNOSIS — N2581 Secondary hyperparathyroidism of renal origin: Secondary | ICD-10-CM | POA: Diagnosis not present

## 2018-03-04 DIAGNOSIS — N186 End stage renal disease: Secondary | ICD-10-CM | POA: Diagnosis not present

## 2018-03-05 ENCOUNTER — Encounter: Payer: Self-pay | Admitting: Internal Medicine

## 2018-03-05 ENCOUNTER — Inpatient Hospital Stay: Payer: Medicare Other | Attending: Internal Medicine | Admitting: Internal Medicine

## 2018-03-05 VITALS — BP 127/81 | HR 71 | Temp 97.9°F | Resp 18 | Ht 70.0 in | Wt 163.8 lb

## 2018-03-05 DIAGNOSIS — R918 Other nonspecific abnormal finding of lung field: Secondary | ICD-10-CM | POA: Diagnosis not present

## 2018-03-05 DIAGNOSIS — IMO0001 Reserved for inherently not codable concepts without codable children: Secondary | ICD-10-CM | POA: Insufficient documentation

## 2018-03-05 DIAGNOSIS — Z79899 Other long term (current) drug therapy: Secondary | ICD-10-CM | POA: Diagnosis not present

## 2018-03-05 DIAGNOSIS — I10 Essential (primary) hypertension: Secondary | ICD-10-CM | POA: Diagnosis not present

## 2018-03-05 DIAGNOSIS — R911 Solitary pulmonary nodule: Secondary | ICD-10-CM | POA: Insufficient documentation

## 2018-03-05 DIAGNOSIS — Z7982 Long term (current) use of aspirin: Secondary | ICD-10-CM

## 2018-03-05 NOTE — Progress Notes (Signed)
[place] [phone]   [phone]  OFFICE PROGRESS NOTE  [address], [address] [place] [address]  DIAGNOSIS: Hypermetabolic right upper lobe pulmonary nodule suspicious for lung cancer.  PRIOR THERAPY: None  CURRENT THERAPY: None  INTERVAL HISTORY: Jessica Clarke 68 y.o. female returns to the clinic today for follow-up visit.  The patient is feeling fine today with no concerning complaints except for itching and dry skin.  She is currently on hemodialysis 3 days a week.  She denied having any current chest pain, shortness of breath except with exertion with no cough or hemoptysis.  She denied having any fever or chills.  She has no nausea, vomiting, diarrhea or constipation.  She had a PET scan performed recently and she is here for evaluation and discussion of her scan results and treatment options.  MEDICAL HISTORY: Past Medical History:  Diagnosis Date  . Abnormal Pap smear    ASCUS ?HGSIL  . Anemia   . Cervical dysplasia   . CHF (congestive heart failure) ([facility])   . Chronic kidney disease   . Constipation   . Constipation   . GERD (gastroesophageal reflux disease)   . Gout   . Hyperlipidemia   . Hypertension   . Myocardial infarction ([facility]) 2015  . Obesity   . Pneumonia   . Uterine polyp     ALLERGIES:  is allergic to no known allergies.  MEDICATIONS:  Current Outpatient Medications  Medication Sig Dispense Refill  . allopurinol (ZYLOPRIM) 100 MG tablet Take 100 mg by mouth daily.      Marland Kitchen aspirin EC 325 MG tablet Take 1 tablet (325 mg total) by mouth daily. 30 tablet 0  . atenolol (TENORMIN) 100 MG tablet Take 1 tablet (100 mg total) by mouth daily. 60 tablet 0  . atorvastatin (LIPITOR) 10 MG tablet   0  . augmented betamethasone dipropionate (DIPROLENE-AF) 0.05 % ointment APP AA BID  0  . doxycycline (VIBRA-TABS) 100 MG tablet Take 1 tablet (100 mg total) by mouth every 12 (twelve) hours. 8 tablet  0  . Multiple Vitamin (MULTIVITAMIN WITH MINERALS) TABS tablet Take 1 tablet by mouth daily. 30 tablet 0  . Nutritional Supplements (FEEDING SUPPLEMENT, NEPRO CARB STEADY,) LIQD Take 237 mLs by mouth 2 (two) times daily between meals. 60 Can 0  . triamcinolone cream (KENALOG) 0.1 %   1  . VELPHORO 500 MG chewable tablet Chew 2 tablets by mouth 3 (three) times daily.     No current facility-administered medications for this visit.     SURGICAL HISTORY:  Past Surgical History:  Procedure Laterality Date  . AV FISTULA PLACEMENT  01/18/2012   Procedure: ARTERIOVENOUS (AV) FISTULA CREATION;  Surgeon: Angelia Mould, MD;  Location: [facility];  Service: Vascular;  Laterality: Left;  . DILATION AND CURETTAGE OF UTERUS    . HYSTEROSCOPY    . LEEP    . LEFT HEART CATHETERIZATION WITH CORONARY ANGIOGRAM N/A 08/26/2013   Procedure: LEFT HEART CATHETERIZATION WITH CORONARY ANGIOGRAM;  Surgeon: Leonie Man, MD;  Location: [facility] CATH LAB;  Service: Cardiovascular;  Laterality: N/A;  . REVISON OF ARTERIOVENOUS FISTULA Left 9/56/3875   Procedure: PLICATION OF ARTERIOVENOUS FISTULA;  Surgeon: Conrad [place] Gardens, MD;  Location: [facility];  Service: Vascular;  Laterality: Left;    REVIEW OF SYSTEMS:  Constitutional: positive for fatigue Eyes: negative Ears, nose, mouth, throat, and face: negative Respiratory: positive for dyspnea on exertion Cardiovascular:  negative Gastrointestinal: negative Genitourinary:negative Integument/breast: positive for dryness and pruritus Hematologic/lymphatic: negative Musculoskeletal:negative Neurological: negative Behavioral/Psych: negative Endocrine: negative Allergic/Immunologic: negative   PHYSICAL EXAMINATION: General appearance: alert, cooperative, fatigued and no distress Head: Normocephalic, without obvious abnormality, atraumatic Neck: no adenopathy, no JVD, supple, symmetrical, trachea midline and thyroid not enlarged, symmetric, no tenderness/mass/nodules Lymph  nodes: Cervical, supraclavicular, and axillary nodes normal. Resp: clear to auscultation bilaterally Back: symmetric, no curvature. ROM normal. No CVA tenderness. Cardio: regular rate and rhythm, S1, S2 normal, no murmur, click, rub or gallop GI: soft, non-tender; bowel sounds normal; no masses,  no organomegaly Extremities: extremities normal, atraumatic, no cyanosis or edema Neurologic: Alert and oriented X 3, normal strength and tone. Normal symmetric reflexes. Normal coordination and gait  ECOG PERFORMANCE STATUS: 1 - Symptomatic but completely ambulatory  Blood pressure 127/81, pulse 71, temperature 97.9 F (36.6 C), temperature source Oral, resp. rate 18, height 5\' 10"  (1.778 m), weight 163 lb 12.8 oz (74.3 kg), SpO2 95 %.  LABORATORY DATA: Lab Results  Component Value Date   WBC 4.6 02/04/2018   HGB 11.5 (L) 02/04/2018   HCT 35.5 (L) 02/04/2018   MCV 98.9 02/04/2018   PLT 100 (L) 02/04/2018      Chemistry      Component Value Date/Time   NA 137 02/04/2018 1507   K 3.6 02/04/2018 1507   CL 94 (L) 02/04/2018 1507   CO2 30 02/04/2018 1507   BUN 22 02/04/2018 1507   CREATININE 4.40 (HH) 02/04/2018 1507      Component Value Date/Time   CALCIUM 10.1 02/04/2018 1507   CALCIUM 9.0 10/22/2009 2253   ALKPHOS 171 (H) 02/04/2018 1507   AST 23 02/04/2018 1507   ALT 14 02/04/2018 1507   BILITOT 1.0 02/04/2018 1507       RADIOGRAPHIC STUDIES: Nm Pet Image Initial (pi) Skull Base To Thigh  Result Date: 02/26/2018 CLINICAL DATA:  Initial treatment strategy for right upper lobe primary bronchogenic carcinoma. EXAM: NUCLEAR MEDICINE PET SKULL BASE TO THIGH TECHNIQUE: 8.2 mCi F-18 FDG was injected intravenously. Full-ring PET imaging was performed from the skull base to thigh after the radiotracer. CT data was obtained and used for attenuation correction and anatomic localization. Fasting blood glucose: 92 mg/dl COMPARISON:  Chest CT 12/25/2017. FINDINGS: Mediastinal blood pool  activity: SUV max 2.2 NECK: Low left jugular/supraclavicular node measures 10 mm and a S.U.V. max of 2.8 on image 45/4. Incidental CT findings: Bilateral carotid atherosclerosis. No cervical adenopathy. Left-sided mastoid sclerosis may be secondary to chronic mastoid effusion. Mucous retention cyst or polyp in the right maxillary sinus. CHEST: Hypermetabolism corresponding to the right upper lobe pulmonary nodule. 1.9 cm and a S.U.V. max of 9.9 on image 15/8. Hypermetabolic mediastinal nodes. Right paratracheal node measures 1.5 cm and a S.U.V. max of 5.6 on image 59/4. Right hilar hypermetabolism at a S.U.V. max of 3.7, without well-defined adenopathy in this area. Prevascular nodes measure maximally 9 mm and a S.U.V. max of 3.3 on image 61/4. Incidental CT findings: Advanced aortic and branch vessel atherosclerosis. Moderate cardiomegaly with multivessel coronary artery atherosclerosis. Pulmonary artery enlargement, outflow tract 3.6 cm. Trace right pleural fluid is similar. Moderate emphysema. Calcified left upper lobe granuloma. ABDOMEN/PELVIS: Left adrenal hypermetabolism without well-defined dominant nodule. This measures a S.U.V. max of 3.7 and is concurrent with left adrenal thickening. A left external iliac node measures 7 mm and a S.U.V. max of 4.0 on image 166/4. Incidental CT findings: Moderate to marked bilateral renal atrophy. Advanced abdominal  aortic atherosclerosis. Multiple small abdominal retroperitoneal nodes. None are pathologic by size criteria. Small volume abdominopelvic ascites. SKELETON: No abnormal marrow activity. Note is made of right shoulder soft tissue hypermetabolism which is likely degenerative. Incidental CT findings: none IMPRESSION: 1. Right upper lobe primary bronchogenic carcinoma. 2. Hypermetabolic nodes within the mediastinum, nonspecific in the setting of possible interstitial lung disease. Cannot exclude nodal metastasis. A small mildly hypermetabolic node at the left  thoracic inlet is also indeterminate. 3. No findings of hypermetabolic metastasis within the abdomen or pelvis. Left adrenal hypermetabolism is favored to be physiologic, given absence of well-defined nodule. A left pelvic sidewall small hypermetabolic node is favored to be reactive. 4. Aortic atherosclerosis (ICD10-I70.0), coronary artery atherosclerosis and emphysema (ICD10-J43.9). 5. Pulmonary artery enlargement suggests pulmonary arterial hypertension. 6. Trace right pleural fluid is similar. Small volume abdominopelvic ascites, suggesting fluid overload. Electronically Signed   By: Abigail Miyamoto M.D.   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   I personally and independently reviewed the PET scan images and discussed the results with the patient today. I had a lengthy discussion with the patient today about her current condition and treatment options.  The PET scan showed hypermetabolic right upper lobe pulmonary nodule highly suspicious for bronchogenic carcinoma.  There is also nonspecific hypermetabolic nodes within the mediastinum but these are indeterminate.  And there was no evidence for extrathoracic metastasis. I recommended for the patient to have pulmonary function test.  I will refer the patient to cardiothoracic surgery for surgical evaluation first.  If she is not a good surgical candidate, we will arrange for the patient to have a biopsy followed by stereotactic body radiotherapy. For smoking cessation, I strongly encouraged the patient to quit smoking. I will see her back for follow-up visit in 1 months for reevaluation after her surgical consultation. Her itching as well as the dryness of the skin are likely secondary to her chronic renal insufficiency and dialysis. The patient was advised to call immediately  if she has any concerning symptoms in the interval. The patient voices understanding of current disease status and treatment options and is in agreement with the current care plan.  All questions were answered. The patient knows to call the clinic with any problems, questions or concerns. We can certainly see the patient much sooner if necessary.  I spent 15 minutes counseling the patient face to face. The total time spent in the appointment was 25 minutes.  Disclaimer: This note was dictated with voice recognition software. Similar sounding words can inadvertently be transcribed and may not be corrected upon review.

## 2018-03-06 ENCOUNTER — Telehealth: Payer: Self-pay | Admitting: Internal Medicine

## 2018-03-06 DIAGNOSIS — D631 Anemia in chronic kidney disease: Secondary | ICD-10-CM | POA: Diagnosis not present

## 2018-03-06 DIAGNOSIS — N186 End stage renal disease: Secondary | ICD-10-CM | POA: Diagnosis not present

## 2018-03-06 DIAGNOSIS — N2581 Secondary hyperparathyroidism of renal origin: Secondary | ICD-10-CM | POA: Diagnosis not present

## 2018-03-06 NOTE — Telephone Encounter (Signed)
Scheduled appt per 1/21 los - called pateint - unable to reach - left message with appt date and time and sent reminder letter in the mail.

## 2018-03-11 DIAGNOSIS — D631 Anemia in chronic kidney disease: Secondary | ICD-10-CM | POA: Diagnosis not present

## 2018-03-11 DIAGNOSIS — N186 End stage renal disease: Secondary | ICD-10-CM | POA: Diagnosis not present

## 2018-03-11 DIAGNOSIS — N2581 Secondary hyperparathyroidism of renal origin: Secondary | ICD-10-CM | POA: Diagnosis not present

## 2018-03-13 DIAGNOSIS — N186 End stage renal disease: Secondary | ICD-10-CM | POA: Diagnosis not present

## 2018-03-13 DIAGNOSIS — D631 Anemia in chronic kidney disease: Secondary | ICD-10-CM | POA: Diagnosis not present

## 2018-03-13 DIAGNOSIS — N2581 Secondary hyperparathyroidism of renal origin: Secondary | ICD-10-CM | POA: Diagnosis not present

## 2018-03-14 ENCOUNTER — Encounter: Payer: Medicare Other | Admitting: Thoracic Surgery (Cardiothoracic Vascular Surgery)

## 2018-03-15 ENCOUNTER — Encounter: Payer: Self-pay | Admitting: Thoracic Surgery (Cardiothoracic Vascular Surgery)

## 2018-03-15 DIAGNOSIS — N2581 Secondary hyperparathyroidism of renal origin: Secondary | ICD-10-CM | POA: Diagnosis not present

## 2018-03-15 DIAGNOSIS — D631 Anemia in chronic kidney disease: Secondary | ICD-10-CM | POA: Diagnosis not present

## 2018-03-15 DIAGNOSIS — N186 End stage renal disease: Secondary | ICD-10-CM | POA: Diagnosis not present

## 2018-03-16 DIAGNOSIS — N2889 Other specified disorders of kidney and ureter: Secondary | ICD-10-CM | POA: Diagnosis not present

## 2018-03-16 DIAGNOSIS — N186 End stage renal disease: Secondary | ICD-10-CM | POA: Diagnosis not present

## 2018-03-16 DIAGNOSIS — Z992 Dependence on renal dialysis: Secondary | ICD-10-CM | POA: Diagnosis not present

## 2018-03-18 DIAGNOSIS — D631 Anemia in chronic kidney disease: Secondary | ICD-10-CM | POA: Diagnosis not present

## 2018-03-18 DIAGNOSIS — N2581 Secondary hyperparathyroidism of renal origin: Secondary | ICD-10-CM | POA: Diagnosis not present

## 2018-03-18 DIAGNOSIS — N186 End stage renal disease: Secondary | ICD-10-CM | POA: Diagnosis not present

## 2018-03-20 DIAGNOSIS — D631 Anemia in chronic kidney disease: Secondary | ICD-10-CM | POA: Diagnosis not present

## 2018-03-20 DIAGNOSIS — N186 End stage renal disease: Secondary | ICD-10-CM | POA: Diagnosis not present

## 2018-03-20 DIAGNOSIS — N2581 Secondary hyperparathyroidism of renal origin: Secondary | ICD-10-CM | POA: Diagnosis not present

## 2018-03-22 DIAGNOSIS — N186 End stage renal disease: Secondary | ICD-10-CM | POA: Diagnosis not present

## 2018-03-22 DIAGNOSIS — N2581 Secondary hyperparathyroidism of renal origin: Secondary | ICD-10-CM | POA: Diagnosis not present

## 2018-03-22 DIAGNOSIS — D631 Anemia in chronic kidney disease: Secondary | ICD-10-CM | POA: Diagnosis not present

## 2018-03-25 DIAGNOSIS — N186 End stage renal disease: Secondary | ICD-10-CM | POA: Diagnosis not present

## 2018-03-25 DIAGNOSIS — N2581 Secondary hyperparathyroidism of renal origin: Secondary | ICD-10-CM | POA: Diagnosis not present

## 2018-03-25 DIAGNOSIS — D631 Anemia in chronic kidney disease: Secondary | ICD-10-CM | POA: Diagnosis not present

## 2018-03-27 DIAGNOSIS — D631 Anemia in chronic kidney disease: Secondary | ICD-10-CM | POA: Diagnosis not present

## 2018-03-27 DIAGNOSIS — N2581 Secondary hyperparathyroidism of renal origin: Secondary | ICD-10-CM | POA: Diagnosis not present

## 2018-03-27 DIAGNOSIS — N186 End stage renal disease: Secondary | ICD-10-CM | POA: Diagnosis not present

## 2018-03-29 DIAGNOSIS — N2581 Secondary hyperparathyroidism of renal origin: Secondary | ICD-10-CM | POA: Diagnosis not present

## 2018-03-29 DIAGNOSIS — N186 End stage renal disease: Secondary | ICD-10-CM | POA: Diagnosis not present

## 2018-03-29 DIAGNOSIS — D631 Anemia in chronic kidney disease: Secondary | ICD-10-CM | POA: Diagnosis not present

## 2018-04-01 DIAGNOSIS — N186 End stage renal disease: Secondary | ICD-10-CM | POA: Diagnosis not present

## 2018-04-01 DIAGNOSIS — N2581 Secondary hyperparathyroidism of renal origin: Secondary | ICD-10-CM | POA: Diagnosis not present

## 2018-04-01 DIAGNOSIS — D631 Anemia in chronic kidney disease: Secondary | ICD-10-CM | POA: Diagnosis not present

## 2018-04-03 DIAGNOSIS — N2581 Secondary hyperparathyroidism of renal origin: Secondary | ICD-10-CM | POA: Diagnosis not present

## 2018-04-03 DIAGNOSIS — N186 End stage renal disease: Secondary | ICD-10-CM | POA: Diagnosis not present

## 2018-04-03 DIAGNOSIS — D631 Anemia in chronic kidney disease: Secondary | ICD-10-CM | POA: Diagnosis not present

## 2018-04-05 DIAGNOSIS — N2581 Secondary hyperparathyroidism of renal origin: Secondary | ICD-10-CM | POA: Diagnosis not present

## 2018-04-05 DIAGNOSIS — N186 End stage renal disease: Secondary | ICD-10-CM | POA: Diagnosis not present

## 2018-04-05 DIAGNOSIS — D631 Anemia in chronic kidney disease: Secondary | ICD-10-CM | POA: Diagnosis not present

## 2018-04-09 ENCOUNTER — Telehealth: Payer: Self-pay | Admitting: *Deleted

## 2018-04-09 ENCOUNTER — Other Ambulatory Visit: Payer: Self-pay | Admitting: Medical Oncology

## 2018-04-09 DIAGNOSIS — IMO0001 Reserved for inherently not codable concepts without codable children: Secondary | ICD-10-CM

## 2018-04-09 DIAGNOSIS — R911 Solitary pulmonary nodule: Secondary | ICD-10-CM

## 2018-04-09 NOTE — Telephone Encounter (Signed)
left vm for patient to return call to r/s missed appointment with Dr Roxan Hockey that was missed on 03/14/2017.

## 2018-04-10 DIAGNOSIS — D631 Anemia in chronic kidney disease: Secondary | ICD-10-CM | POA: Diagnosis not present

## 2018-04-10 DIAGNOSIS — N186 End stage renal disease: Secondary | ICD-10-CM | POA: Diagnosis not present

## 2018-04-10 DIAGNOSIS — N2581 Secondary hyperparathyroidism of renal origin: Secondary | ICD-10-CM | POA: Diagnosis not present

## 2018-04-11 ENCOUNTER — Inpatient Hospital Stay: Payer: Medicare Other | Attending: Internal Medicine | Admitting: Internal Medicine

## 2018-04-11 ENCOUNTER — Inpatient Hospital Stay: Payer: Medicare Other

## 2018-04-12 DIAGNOSIS — D631 Anemia in chronic kidney disease: Secondary | ICD-10-CM | POA: Diagnosis not present

## 2018-04-12 DIAGNOSIS — N186 End stage renal disease: Secondary | ICD-10-CM | POA: Diagnosis not present

## 2018-04-12 DIAGNOSIS — N2581 Secondary hyperparathyroidism of renal origin: Secondary | ICD-10-CM | POA: Diagnosis not present

## 2018-04-14 DIAGNOSIS — N2889 Other specified disorders of kidney and ureter: Secondary | ICD-10-CM | POA: Diagnosis not present

## 2018-04-14 DIAGNOSIS — N186 End stage renal disease: Secondary | ICD-10-CM | POA: Diagnosis not present

## 2018-04-14 DIAGNOSIS — Z992 Dependence on renal dialysis: Secondary | ICD-10-CM | POA: Diagnosis not present

## 2018-04-15 DIAGNOSIS — N2581 Secondary hyperparathyroidism of renal origin: Secondary | ICD-10-CM | POA: Diagnosis not present

## 2018-04-15 DIAGNOSIS — D631 Anemia in chronic kidney disease: Secondary | ICD-10-CM | POA: Diagnosis not present

## 2018-04-15 DIAGNOSIS — N186 End stage renal disease: Secondary | ICD-10-CM | POA: Diagnosis not present

## 2018-04-17 DIAGNOSIS — N186 End stage renal disease: Secondary | ICD-10-CM | POA: Diagnosis not present

## 2018-04-17 DIAGNOSIS — N2581 Secondary hyperparathyroidism of renal origin: Secondary | ICD-10-CM | POA: Diagnosis not present

## 2018-04-17 DIAGNOSIS — D631 Anemia in chronic kidney disease: Secondary | ICD-10-CM | POA: Diagnosis not present

## 2018-04-19 DIAGNOSIS — N2581 Secondary hyperparathyroidism of renal origin: Secondary | ICD-10-CM | POA: Diagnosis not present

## 2018-04-19 DIAGNOSIS — N186 End stage renal disease: Secondary | ICD-10-CM | POA: Diagnosis not present

## 2018-04-19 DIAGNOSIS — D631 Anemia in chronic kidney disease: Secondary | ICD-10-CM | POA: Diagnosis not present

## 2018-04-22 DIAGNOSIS — N2581 Secondary hyperparathyroidism of renal origin: Secondary | ICD-10-CM | POA: Diagnosis not present

## 2018-04-22 DIAGNOSIS — D631 Anemia in chronic kidney disease: Secondary | ICD-10-CM | POA: Diagnosis not present

## 2018-04-22 DIAGNOSIS — N186 End stage renal disease: Secondary | ICD-10-CM | POA: Diagnosis not present

## 2018-04-26 DIAGNOSIS — N2581 Secondary hyperparathyroidism of renal origin: Secondary | ICD-10-CM | POA: Diagnosis not present

## 2018-04-26 DIAGNOSIS — D631 Anemia in chronic kidney disease: Secondary | ICD-10-CM | POA: Diagnosis not present

## 2018-04-26 DIAGNOSIS — N186 End stage renal disease: Secondary | ICD-10-CM | POA: Diagnosis not present

## 2018-04-29 DIAGNOSIS — N2581 Secondary hyperparathyroidism of renal origin: Secondary | ICD-10-CM | POA: Diagnosis not present

## 2018-04-29 DIAGNOSIS — N186 End stage renal disease: Secondary | ICD-10-CM | POA: Diagnosis not present

## 2018-04-29 DIAGNOSIS — D631 Anemia in chronic kidney disease: Secondary | ICD-10-CM | POA: Diagnosis not present

## 2018-05-01 DIAGNOSIS — N186 End stage renal disease: Secondary | ICD-10-CM | POA: Diagnosis not present

## 2018-05-01 DIAGNOSIS — D631 Anemia in chronic kidney disease: Secondary | ICD-10-CM | POA: Diagnosis not present

## 2018-05-01 DIAGNOSIS — N2581 Secondary hyperparathyroidism of renal origin: Secondary | ICD-10-CM | POA: Diagnosis not present

## 2018-05-03 DIAGNOSIS — D631 Anemia in chronic kidney disease: Secondary | ICD-10-CM | POA: Diagnosis not present

## 2018-05-03 DIAGNOSIS — N2581 Secondary hyperparathyroidism of renal origin: Secondary | ICD-10-CM | POA: Diagnosis not present

## 2018-05-03 DIAGNOSIS — N186 End stage renal disease: Secondary | ICD-10-CM | POA: Diagnosis not present

## 2018-05-06 DIAGNOSIS — N186 End stage renal disease: Secondary | ICD-10-CM | POA: Diagnosis not present

## 2018-05-06 DIAGNOSIS — D631 Anemia in chronic kidney disease: Secondary | ICD-10-CM | POA: Diagnosis not present

## 2018-05-06 DIAGNOSIS — N2581 Secondary hyperparathyroidism of renal origin: Secondary | ICD-10-CM | POA: Diagnosis not present

## 2018-05-08 DIAGNOSIS — D631 Anemia in chronic kidney disease: Secondary | ICD-10-CM | POA: Diagnosis not present

## 2018-05-08 DIAGNOSIS — N2581 Secondary hyperparathyroidism of renal origin: Secondary | ICD-10-CM | POA: Diagnosis not present

## 2018-05-08 DIAGNOSIS — N186 End stage renal disease: Secondary | ICD-10-CM | POA: Diagnosis not present

## 2018-05-10 DIAGNOSIS — N2581 Secondary hyperparathyroidism of renal origin: Secondary | ICD-10-CM | POA: Diagnosis not present

## 2018-05-10 DIAGNOSIS — D631 Anemia in chronic kidney disease: Secondary | ICD-10-CM | POA: Diagnosis not present

## 2018-05-10 DIAGNOSIS — N186 End stage renal disease: Secondary | ICD-10-CM | POA: Diagnosis not present

## 2018-05-13 DIAGNOSIS — D631 Anemia in chronic kidney disease: Secondary | ICD-10-CM | POA: Diagnosis not present

## 2018-05-13 DIAGNOSIS — N186 End stage renal disease: Secondary | ICD-10-CM | POA: Diagnosis not present

## 2018-05-13 DIAGNOSIS — N2581 Secondary hyperparathyroidism of renal origin: Secondary | ICD-10-CM | POA: Diagnosis not present

## 2018-05-15 DIAGNOSIS — D509 Iron deficiency anemia, unspecified: Secondary | ICD-10-CM | POA: Diagnosis not present

## 2018-05-15 DIAGNOSIS — N186 End stage renal disease: Secondary | ICD-10-CM | POA: Diagnosis not present

## 2018-05-15 DIAGNOSIS — N2581 Secondary hyperparathyroidism of renal origin: Secondary | ICD-10-CM | POA: Diagnosis not present

## 2018-05-15 DIAGNOSIS — Z992 Dependence on renal dialysis: Secondary | ICD-10-CM | POA: Diagnosis not present

## 2018-05-15 DIAGNOSIS — N2889 Other specified disorders of kidney and ureter: Secondary | ICD-10-CM | POA: Diagnosis not present

## 2018-05-17 DIAGNOSIS — D509 Iron deficiency anemia, unspecified: Secondary | ICD-10-CM | POA: Diagnosis not present

## 2018-05-17 DIAGNOSIS — N2581 Secondary hyperparathyroidism of renal origin: Secondary | ICD-10-CM | POA: Diagnosis not present

## 2018-05-17 DIAGNOSIS — N186 End stage renal disease: Secondary | ICD-10-CM | POA: Diagnosis not present

## 2018-05-22 DIAGNOSIS — D509 Iron deficiency anemia, unspecified: Secondary | ICD-10-CM | POA: Diagnosis not present

## 2018-05-22 DIAGNOSIS — N186 End stage renal disease: Secondary | ICD-10-CM | POA: Diagnosis not present

## 2018-05-22 DIAGNOSIS — N2581 Secondary hyperparathyroidism of renal origin: Secondary | ICD-10-CM | POA: Diagnosis not present

## 2018-05-24 DIAGNOSIS — N186 End stage renal disease: Secondary | ICD-10-CM | POA: Diagnosis not present

## 2018-05-24 DIAGNOSIS — N2581 Secondary hyperparathyroidism of renal origin: Secondary | ICD-10-CM | POA: Diagnosis not present

## 2018-05-24 DIAGNOSIS — D509 Iron deficiency anemia, unspecified: Secondary | ICD-10-CM | POA: Diagnosis not present

## 2018-05-27 DIAGNOSIS — N2581 Secondary hyperparathyroidism of renal origin: Secondary | ICD-10-CM | POA: Diagnosis not present

## 2018-05-27 DIAGNOSIS — N186 End stage renal disease: Secondary | ICD-10-CM | POA: Diagnosis not present

## 2018-05-27 DIAGNOSIS — D509 Iron deficiency anemia, unspecified: Secondary | ICD-10-CM | POA: Diagnosis not present

## 2018-05-29 DIAGNOSIS — N186 End stage renal disease: Secondary | ICD-10-CM | POA: Diagnosis not present

## 2018-05-29 DIAGNOSIS — D509 Iron deficiency anemia, unspecified: Secondary | ICD-10-CM | POA: Diagnosis not present

## 2018-05-29 DIAGNOSIS — N2581 Secondary hyperparathyroidism of renal origin: Secondary | ICD-10-CM | POA: Diagnosis not present

## 2018-05-31 DIAGNOSIS — D509 Iron deficiency anemia, unspecified: Secondary | ICD-10-CM | POA: Diagnosis not present

## 2018-05-31 DIAGNOSIS — N186 End stage renal disease: Secondary | ICD-10-CM | POA: Diagnosis not present

## 2018-05-31 DIAGNOSIS — N2581 Secondary hyperparathyroidism of renal origin: Secondary | ICD-10-CM | POA: Diagnosis not present

## 2018-06-03 DIAGNOSIS — N2581 Secondary hyperparathyroidism of renal origin: Secondary | ICD-10-CM | POA: Diagnosis not present

## 2018-06-03 DIAGNOSIS — D509 Iron deficiency anemia, unspecified: Secondary | ICD-10-CM | POA: Diagnosis not present

## 2018-06-03 DIAGNOSIS — N186 End stage renal disease: Secondary | ICD-10-CM | POA: Diagnosis not present

## 2018-06-05 DIAGNOSIS — D509 Iron deficiency anemia, unspecified: Secondary | ICD-10-CM | POA: Diagnosis not present

## 2018-06-05 DIAGNOSIS — N186 End stage renal disease: Secondary | ICD-10-CM | POA: Diagnosis not present

## 2018-06-05 DIAGNOSIS — N2581 Secondary hyperparathyroidism of renal origin: Secondary | ICD-10-CM | POA: Diagnosis not present

## 2018-06-07 DIAGNOSIS — N2581 Secondary hyperparathyroidism of renal origin: Secondary | ICD-10-CM | POA: Diagnosis not present

## 2018-06-07 DIAGNOSIS — D509 Iron deficiency anemia, unspecified: Secondary | ICD-10-CM | POA: Diagnosis not present

## 2018-06-07 DIAGNOSIS — N186 End stage renal disease: Secondary | ICD-10-CM | POA: Diagnosis not present

## 2018-06-10 DIAGNOSIS — N186 End stage renal disease: Secondary | ICD-10-CM | POA: Diagnosis not present

## 2018-06-10 DIAGNOSIS — D509 Iron deficiency anemia, unspecified: Secondary | ICD-10-CM | POA: Diagnosis not present

## 2018-06-10 DIAGNOSIS — N2581 Secondary hyperparathyroidism of renal origin: Secondary | ICD-10-CM | POA: Diagnosis not present

## 2018-06-11 ENCOUNTER — Encounter (HOSPITAL_COMMUNITY): Payer: Medicare Other | Admitting: Internal Medicine

## 2018-06-11 ENCOUNTER — Other Ambulatory Visit (HOSPITAL_COMMUNITY): Payer: Medicare Other

## 2018-06-12 DIAGNOSIS — D509 Iron deficiency anemia, unspecified: Secondary | ICD-10-CM | POA: Diagnosis not present

## 2018-06-12 DIAGNOSIS — N2581 Secondary hyperparathyroidism of renal origin: Secondary | ICD-10-CM | POA: Diagnosis not present

## 2018-06-12 DIAGNOSIS — N186 End stage renal disease: Secondary | ICD-10-CM | POA: Diagnosis not present

## 2018-06-14 DIAGNOSIS — N2581 Secondary hyperparathyroidism of renal origin: Secondary | ICD-10-CM | POA: Diagnosis not present

## 2018-06-14 DIAGNOSIS — Z992 Dependence on renal dialysis: Secondary | ICD-10-CM | POA: Diagnosis not present

## 2018-06-14 DIAGNOSIS — N186 End stage renal disease: Secondary | ICD-10-CM | POA: Diagnosis not present

## 2018-06-14 DIAGNOSIS — N2889 Other specified disorders of kidney and ureter: Secondary | ICD-10-CM | POA: Diagnosis not present

## 2018-06-14 DIAGNOSIS — D509 Iron deficiency anemia, unspecified: Secondary | ICD-10-CM | POA: Diagnosis not present

## 2018-06-19 DIAGNOSIS — D509 Iron deficiency anemia, unspecified: Secondary | ICD-10-CM | POA: Diagnosis not present

## 2018-06-19 DIAGNOSIS — N2581 Secondary hyperparathyroidism of renal origin: Secondary | ICD-10-CM | POA: Diagnosis not present

## 2018-06-19 DIAGNOSIS — N186 End stage renal disease: Secondary | ICD-10-CM | POA: Diagnosis not present

## 2018-06-21 DIAGNOSIS — D509 Iron deficiency anemia, unspecified: Secondary | ICD-10-CM | POA: Diagnosis not present

## 2018-06-21 DIAGNOSIS — N2581 Secondary hyperparathyroidism of renal origin: Secondary | ICD-10-CM | POA: Diagnosis not present

## 2018-06-21 DIAGNOSIS — N186 End stage renal disease: Secondary | ICD-10-CM | POA: Diagnosis not present

## 2018-06-24 DIAGNOSIS — D509 Iron deficiency anemia, unspecified: Secondary | ICD-10-CM | POA: Diagnosis not present

## 2018-06-24 DIAGNOSIS — N186 End stage renal disease: Secondary | ICD-10-CM | POA: Diagnosis not present

## 2018-06-24 DIAGNOSIS — N2581 Secondary hyperparathyroidism of renal origin: Secondary | ICD-10-CM | POA: Diagnosis not present

## 2018-06-26 DIAGNOSIS — N186 End stage renal disease: Secondary | ICD-10-CM | POA: Diagnosis not present

## 2018-06-26 DIAGNOSIS — N2581 Secondary hyperparathyroidism of renal origin: Secondary | ICD-10-CM | POA: Diagnosis not present

## 2018-06-26 DIAGNOSIS — D509 Iron deficiency anemia, unspecified: Secondary | ICD-10-CM | POA: Diagnosis not present

## 2018-06-28 DIAGNOSIS — D509 Iron deficiency anemia, unspecified: Secondary | ICD-10-CM | POA: Diagnosis not present

## 2018-06-28 DIAGNOSIS — N186 End stage renal disease: Secondary | ICD-10-CM | POA: Diagnosis not present

## 2018-06-28 DIAGNOSIS — N2581 Secondary hyperparathyroidism of renal origin: Secondary | ICD-10-CM | POA: Diagnosis not present

## 2018-07-03 DIAGNOSIS — N186 End stage renal disease: Secondary | ICD-10-CM | POA: Diagnosis not present

## 2018-07-03 DIAGNOSIS — D509 Iron deficiency anemia, unspecified: Secondary | ICD-10-CM | POA: Diagnosis not present

## 2018-07-03 DIAGNOSIS — N2581 Secondary hyperparathyroidism of renal origin: Secondary | ICD-10-CM | POA: Diagnosis not present

## 2018-07-05 DIAGNOSIS — D509 Iron deficiency anemia, unspecified: Secondary | ICD-10-CM | POA: Diagnosis not present

## 2018-07-05 DIAGNOSIS — N2581 Secondary hyperparathyroidism of renal origin: Secondary | ICD-10-CM | POA: Diagnosis not present

## 2018-07-05 DIAGNOSIS — N186 End stage renal disease: Secondary | ICD-10-CM | POA: Diagnosis not present

## 2018-07-08 DIAGNOSIS — D509 Iron deficiency anemia, unspecified: Secondary | ICD-10-CM | POA: Diagnosis not present

## 2018-07-08 DIAGNOSIS — N2581 Secondary hyperparathyroidism of renal origin: Secondary | ICD-10-CM | POA: Diagnosis not present

## 2018-07-08 DIAGNOSIS — N186 End stage renal disease: Secondary | ICD-10-CM | POA: Diagnosis not present

## 2018-07-12 DIAGNOSIS — D509 Iron deficiency anemia, unspecified: Secondary | ICD-10-CM | POA: Diagnosis not present

## 2018-07-12 DIAGNOSIS — N2581 Secondary hyperparathyroidism of renal origin: Secondary | ICD-10-CM | POA: Diagnosis not present

## 2018-07-12 DIAGNOSIS — N186 End stage renal disease: Secondary | ICD-10-CM | POA: Diagnosis not present

## 2018-07-15 DIAGNOSIS — N186 End stage renal disease: Secondary | ICD-10-CM | POA: Diagnosis not present

## 2018-07-15 DIAGNOSIS — N2581 Secondary hyperparathyroidism of renal origin: Secondary | ICD-10-CM | POA: Diagnosis not present

## 2018-07-15 DIAGNOSIS — N2889 Other specified disorders of kidney and ureter: Secondary | ICD-10-CM | POA: Diagnosis not present

## 2018-07-15 DIAGNOSIS — D509 Iron deficiency anemia, unspecified: Secondary | ICD-10-CM | POA: Diagnosis not present

## 2018-07-15 DIAGNOSIS — Z992 Dependence on renal dialysis: Secondary | ICD-10-CM | POA: Diagnosis not present

## 2018-07-17 DIAGNOSIS — N186 End stage renal disease: Secondary | ICD-10-CM | POA: Diagnosis not present

## 2018-07-17 DIAGNOSIS — D509 Iron deficiency anemia, unspecified: Secondary | ICD-10-CM | POA: Diagnosis not present

## 2018-07-17 DIAGNOSIS — N2581 Secondary hyperparathyroidism of renal origin: Secondary | ICD-10-CM | POA: Diagnosis not present

## 2018-07-19 DIAGNOSIS — D509 Iron deficiency anemia, unspecified: Secondary | ICD-10-CM | POA: Diagnosis not present

## 2018-07-19 DIAGNOSIS — N186 End stage renal disease: Secondary | ICD-10-CM | POA: Diagnosis not present

## 2018-07-19 DIAGNOSIS — N2581 Secondary hyperparathyroidism of renal origin: Secondary | ICD-10-CM | POA: Diagnosis not present

## 2018-07-22 DIAGNOSIS — N2581 Secondary hyperparathyroidism of renal origin: Secondary | ICD-10-CM | POA: Diagnosis not present

## 2018-07-22 DIAGNOSIS — D509 Iron deficiency anemia, unspecified: Secondary | ICD-10-CM | POA: Diagnosis not present

## 2018-07-22 DIAGNOSIS — N186 End stage renal disease: Secondary | ICD-10-CM | POA: Diagnosis not present

## 2018-07-26 DIAGNOSIS — D509 Iron deficiency anemia, unspecified: Secondary | ICD-10-CM | POA: Diagnosis not present

## 2018-07-26 DIAGNOSIS — N2581 Secondary hyperparathyroidism of renal origin: Secondary | ICD-10-CM | POA: Diagnosis not present

## 2018-07-26 DIAGNOSIS — N186 End stage renal disease: Secondary | ICD-10-CM | POA: Diagnosis not present

## 2018-07-29 DIAGNOSIS — N2581 Secondary hyperparathyroidism of renal origin: Secondary | ICD-10-CM | POA: Diagnosis not present

## 2018-07-29 DIAGNOSIS — D509 Iron deficiency anemia, unspecified: Secondary | ICD-10-CM | POA: Diagnosis not present

## 2018-07-29 DIAGNOSIS — N186 End stage renal disease: Secondary | ICD-10-CM | POA: Diagnosis not present

## 2018-07-31 DIAGNOSIS — N186 End stage renal disease: Secondary | ICD-10-CM | POA: Diagnosis not present

## 2018-07-31 DIAGNOSIS — D509 Iron deficiency anemia, unspecified: Secondary | ICD-10-CM | POA: Diagnosis not present

## 2018-07-31 DIAGNOSIS — N2581 Secondary hyperparathyroidism of renal origin: Secondary | ICD-10-CM | POA: Diagnosis not present

## 2018-08-02 DIAGNOSIS — N2581 Secondary hyperparathyroidism of renal origin: Secondary | ICD-10-CM | POA: Diagnosis not present

## 2018-08-02 DIAGNOSIS — N186 End stage renal disease: Secondary | ICD-10-CM | POA: Diagnosis not present

## 2018-08-02 DIAGNOSIS — D509 Iron deficiency anemia, unspecified: Secondary | ICD-10-CM | POA: Diagnosis not present

## 2018-08-05 DIAGNOSIS — D509 Iron deficiency anemia, unspecified: Secondary | ICD-10-CM | POA: Diagnosis not present

## 2018-08-05 DIAGNOSIS — N2581 Secondary hyperparathyroidism of renal origin: Secondary | ICD-10-CM | POA: Diagnosis not present

## 2018-08-05 DIAGNOSIS — N186 End stage renal disease: Secondary | ICD-10-CM | POA: Diagnosis not present

## 2018-08-06 DIAGNOSIS — N186 End stage renal disease: Secondary | ICD-10-CM | POA: Diagnosis not present

## 2018-08-06 DIAGNOSIS — T82858A Stenosis of vascular prosthetic devices, implants and grafts, initial encounter: Secondary | ICD-10-CM | POA: Diagnosis not present

## 2018-08-06 DIAGNOSIS — Z992 Dependence on renal dialysis: Secondary | ICD-10-CM | POA: Diagnosis not present

## 2018-08-06 DIAGNOSIS — I871 Compression of vein: Secondary | ICD-10-CM | POA: Diagnosis not present

## 2018-08-07 DIAGNOSIS — N186 End stage renal disease: Secondary | ICD-10-CM | POA: Diagnosis not present

## 2018-08-07 DIAGNOSIS — D509 Iron deficiency anemia, unspecified: Secondary | ICD-10-CM | POA: Diagnosis not present

## 2018-08-07 DIAGNOSIS — N2581 Secondary hyperparathyroidism of renal origin: Secondary | ICD-10-CM | POA: Diagnosis not present

## 2018-08-09 DIAGNOSIS — N2581 Secondary hyperparathyroidism of renal origin: Secondary | ICD-10-CM | POA: Diagnosis not present

## 2018-08-09 DIAGNOSIS — D509 Iron deficiency anemia, unspecified: Secondary | ICD-10-CM | POA: Diagnosis not present

## 2018-08-09 DIAGNOSIS — N186 End stage renal disease: Secondary | ICD-10-CM | POA: Diagnosis not present

## 2018-08-12 DIAGNOSIS — D509 Iron deficiency anemia, unspecified: Secondary | ICD-10-CM | POA: Diagnosis not present

## 2018-08-12 DIAGNOSIS — N2581 Secondary hyperparathyroidism of renal origin: Secondary | ICD-10-CM | POA: Diagnosis not present

## 2018-08-12 DIAGNOSIS — N186 End stage renal disease: Secondary | ICD-10-CM | POA: Diagnosis not present

## 2018-08-14 DIAGNOSIS — D631 Anemia in chronic kidney disease: Secondary | ICD-10-CM | POA: Diagnosis not present

## 2018-08-14 DIAGNOSIS — N2581 Secondary hyperparathyroidism of renal origin: Secondary | ICD-10-CM | POA: Diagnosis not present

## 2018-08-14 DIAGNOSIS — D509 Iron deficiency anemia, unspecified: Secondary | ICD-10-CM | POA: Diagnosis not present

## 2018-08-14 DIAGNOSIS — Z992 Dependence on renal dialysis: Secondary | ICD-10-CM | POA: Diagnosis not present

## 2018-08-14 DIAGNOSIS — N186 End stage renal disease: Secondary | ICD-10-CM | POA: Diagnosis not present

## 2018-08-14 DIAGNOSIS — N2889 Other specified disorders of kidney and ureter: Secondary | ICD-10-CM | POA: Diagnosis not present

## 2018-08-19 DIAGNOSIS — D509 Iron deficiency anemia, unspecified: Secondary | ICD-10-CM | POA: Diagnosis not present

## 2018-08-19 DIAGNOSIS — D631 Anemia in chronic kidney disease: Secondary | ICD-10-CM | POA: Diagnosis not present

## 2018-08-19 DIAGNOSIS — N2581 Secondary hyperparathyroidism of renal origin: Secondary | ICD-10-CM | POA: Diagnosis not present

## 2018-08-19 DIAGNOSIS — N186 End stage renal disease: Secondary | ICD-10-CM | POA: Diagnosis not present

## 2018-08-21 DIAGNOSIS — D509 Iron deficiency anemia, unspecified: Secondary | ICD-10-CM | POA: Diagnosis not present

## 2018-08-21 DIAGNOSIS — N186 End stage renal disease: Secondary | ICD-10-CM | POA: Diagnosis not present

## 2018-08-21 DIAGNOSIS — N2581 Secondary hyperparathyroidism of renal origin: Secondary | ICD-10-CM | POA: Diagnosis not present

## 2018-08-21 DIAGNOSIS — D631 Anemia in chronic kidney disease: Secondary | ICD-10-CM | POA: Diagnosis not present

## 2018-08-23 ENCOUNTER — Telehealth: Payer: Self-pay | Admitting: Internal Medicine

## 2018-08-23 DIAGNOSIS — D509 Iron deficiency anemia, unspecified: Secondary | ICD-10-CM | POA: Diagnosis not present

## 2018-08-23 DIAGNOSIS — D631 Anemia in chronic kidney disease: Secondary | ICD-10-CM | POA: Diagnosis not present

## 2018-08-23 DIAGNOSIS — N186 End stage renal disease: Secondary | ICD-10-CM | POA: Diagnosis not present

## 2018-08-23 DIAGNOSIS — N2581 Secondary hyperparathyroidism of renal origin: Secondary | ICD-10-CM | POA: Diagnosis not present

## 2018-08-23 NOTE — Telephone Encounter (Signed)
I left a message asking the patient to call me at [phone] to schedule AWV visit. VDM (DD)

## 2018-08-26 DIAGNOSIS — N186 End stage renal disease: Secondary | ICD-10-CM | POA: Diagnosis not present

## 2018-08-26 DIAGNOSIS — D631 Anemia in chronic kidney disease: Secondary | ICD-10-CM | POA: Diagnosis not present

## 2018-08-26 DIAGNOSIS — N2581 Secondary hyperparathyroidism of renal origin: Secondary | ICD-10-CM | POA: Diagnosis not present

## 2018-08-26 DIAGNOSIS — D509 Iron deficiency anemia, unspecified: Secondary | ICD-10-CM | POA: Diagnosis not present

## 2018-08-28 DIAGNOSIS — D509 Iron deficiency anemia, unspecified: Secondary | ICD-10-CM | POA: Diagnosis not present

## 2018-08-28 DIAGNOSIS — N2581 Secondary hyperparathyroidism of renal origin: Secondary | ICD-10-CM | POA: Diagnosis not present

## 2018-08-28 DIAGNOSIS — N186 End stage renal disease: Secondary | ICD-10-CM | POA: Diagnosis not present

## 2018-08-28 DIAGNOSIS — D631 Anemia in chronic kidney disease: Secondary | ICD-10-CM | POA: Diagnosis not present

## 2018-08-30 DIAGNOSIS — D509 Iron deficiency anemia, unspecified: Secondary | ICD-10-CM | POA: Diagnosis not present

## 2018-08-30 DIAGNOSIS — N186 End stage renal disease: Secondary | ICD-10-CM | POA: Diagnosis not present

## 2018-08-30 DIAGNOSIS — N2581 Secondary hyperparathyroidism of renal origin: Secondary | ICD-10-CM | POA: Diagnosis not present

## 2018-08-30 DIAGNOSIS — D631 Anemia in chronic kidney disease: Secondary | ICD-10-CM | POA: Diagnosis not present

## 2018-09-02 DIAGNOSIS — D631 Anemia in chronic kidney disease: Secondary | ICD-10-CM | POA: Diagnosis not present

## 2018-09-02 DIAGNOSIS — D509 Iron deficiency anemia, unspecified: Secondary | ICD-10-CM | POA: Diagnosis not present

## 2018-09-02 DIAGNOSIS — N186 End stage renal disease: Secondary | ICD-10-CM | POA: Diagnosis not present

## 2018-09-02 DIAGNOSIS — N2581 Secondary hyperparathyroidism of renal origin: Secondary | ICD-10-CM | POA: Diagnosis not present

## 2018-09-04 DIAGNOSIS — N186 End stage renal disease: Secondary | ICD-10-CM | POA: Diagnosis not present

## 2018-09-04 DIAGNOSIS — N2581 Secondary hyperparathyroidism of renal origin: Secondary | ICD-10-CM | POA: Diagnosis not present

## 2018-09-04 DIAGNOSIS — D631 Anemia in chronic kidney disease: Secondary | ICD-10-CM | POA: Diagnosis not present

## 2018-09-04 DIAGNOSIS — D509 Iron deficiency anemia, unspecified: Secondary | ICD-10-CM | POA: Diagnosis not present

## 2018-09-09 ENCOUNTER — Telehealth: Payer: Self-pay | Admitting: Internal Medicine

## 2018-09-09 DIAGNOSIS — D509 Iron deficiency anemia, unspecified: Secondary | ICD-10-CM | POA: Diagnosis not present

## 2018-09-09 DIAGNOSIS — D631 Anemia in chronic kidney disease: Secondary | ICD-10-CM | POA: Diagnosis not present

## 2018-09-09 DIAGNOSIS — N186 End stage renal disease: Secondary | ICD-10-CM | POA: Diagnosis not present

## 2018-09-09 DIAGNOSIS — N2581 Secondary hyperparathyroidism of renal origin: Secondary | ICD-10-CM | POA: Diagnosis not present

## 2018-09-09 NOTE — Telephone Encounter (Signed)
I left a message asking the pt to call me at [phone] to schedule AWV with Nickeah. VDM (DD)

## 2018-09-11 ENCOUNTER — Other Ambulatory Visit: Payer: Self-pay

## 2018-09-11 DIAGNOSIS — N186 End stage renal disease: Secondary | ICD-10-CM | POA: Diagnosis not present

## 2018-09-11 DIAGNOSIS — D509 Iron deficiency anemia, unspecified: Secondary | ICD-10-CM | POA: Diagnosis not present

## 2018-09-11 DIAGNOSIS — D631 Anemia in chronic kidney disease: Secondary | ICD-10-CM | POA: Diagnosis not present

## 2018-09-11 DIAGNOSIS — N2581 Secondary hyperparathyroidism of renal origin: Secondary | ICD-10-CM | POA: Diagnosis not present

## 2018-09-13 DIAGNOSIS — N186 End stage renal disease: Secondary | ICD-10-CM | POA: Diagnosis not present

## 2018-09-13 DIAGNOSIS — D509 Iron deficiency anemia, unspecified: Secondary | ICD-10-CM | POA: Diagnosis not present

## 2018-09-13 DIAGNOSIS — D631 Anemia in chronic kidney disease: Secondary | ICD-10-CM | POA: Diagnosis not present

## 2018-09-13 DIAGNOSIS — N2581 Secondary hyperparathyroidism of renal origin: Secondary | ICD-10-CM | POA: Diagnosis not present

## 2018-09-14 DIAGNOSIS — Z992 Dependence on renal dialysis: Secondary | ICD-10-CM | POA: Diagnosis not present

## 2018-09-14 DIAGNOSIS — N2889 Other specified disorders of kidney and ureter: Secondary | ICD-10-CM | POA: Diagnosis not present

## 2018-09-14 DIAGNOSIS — N186 End stage renal disease: Secondary | ICD-10-CM | POA: Diagnosis not present

## 2018-09-16 DIAGNOSIS — Z992 Dependence on renal dialysis: Secondary | ICD-10-CM | POA: Diagnosis not present

## 2018-09-16 DIAGNOSIS — N2581 Secondary hyperparathyroidism of renal origin: Secondary | ICD-10-CM | POA: Diagnosis not present

## 2018-09-16 DIAGNOSIS — N186 End stage renal disease: Secondary | ICD-10-CM | POA: Diagnosis not present

## 2018-09-16 DIAGNOSIS — D631 Anemia in chronic kidney disease: Secondary | ICD-10-CM | POA: Diagnosis not present

## 2018-09-18 DIAGNOSIS — N186 End stage renal disease: Secondary | ICD-10-CM | POA: Diagnosis not present

## 2018-09-18 DIAGNOSIS — N2581 Secondary hyperparathyroidism of renal origin: Secondary | ICD-10-CM | POA: Diagnosis not present

## 2018-09-18 DIAGNOSIS — Z992 Dependence on renal dialysis: Secondary | ICD-10-CM | POA: Diagnosis not present

## 2018-09-18 DIAGNOSIS — D631 Anemia in chronic kidney disease: Secondary | ICD-10-CM | POA: Diagnosis not present

## 2018-09-20 DIAGNOSIS — N2581 Secondary hyperparathyroidism of renal origin: Secondary | ICD-10-CM | POA: Diagnosis not present

## 2018-09-20 DIAGNOSIS — Z992 Dependence on renal dialysis: Secondary | ICD-10-CM | POA: Diagnosis not present

## 2018-09-20 DIAGNOSIS — D631 Anemia in chronic kidney disease: Secondary | ICD-10-CM | POA: Diagnosis not present

## 2018-09-20 DIAGNOSIS — N186 End stage renal disease: Secondary | ICD-10-CM | POA: Diagnosis not present

## 2018-09-23 ENCOUNTER — Telehealth: Payer: Self-pay | Admitting: Internal Medicine

## 2018-09-23 DIAGNOSIS — N186 End stage renal disease: Secondary | ICD-10-CM | POA: Diagnosis not present

## 2018-09-23 DIAGNOSIS — N2581 Secondary hyperparathyroidism of renal origin: Secondary | ICD-10-CM | POA: Diagnosis not present

## 2018-09-23 DIAGNOSIS — Z992 Dependence on renal dialysis: Secondary | ICD-10-CM | POA: Diagnosis not present

## 2018-09-23 DIAGNOSIS — D631 Anemia in chronic kidney disease: Secondary | ICD-10-CM | POA: Diagnosis not present

## 2018-09-23 NOTE — Telephone Encounter (Signed)
I left a message asking the pt to call me at [phone] to schedule AWV with Nickeah. VDM (DD)

## 2018-09-25 DIAGNOSIS — N186 End stage renal disease: Secondary | ICD-10-CM | POA: Diagnosis not present

## 2018-09-25 DIAGNOSIS — D631 Anemia in chronic kidney disease: Secondary | ICD-10-CM | POA: Diagnosis not present

## 2018-09-25 DIAGNOSIS — Z992 Dependence on renal dialysis: Secondary | ICD-10-CM | POA: Diagnosis not present

## 2018-09-25 DIAGNOSIS — N2581 Secondary hyperparathyroidism of renal origin: Secondary | ICD-10-CM | POA: Diagnosis not present

## 2018-09-27 DIAGNOSIS — N186 End stage renal disease: Secondary | ICD-10-CM | POA: Diagnosis not present

## 2018-09-27 DIAGNOSIS — Z992 Dependence on renal dialysis: Secondary | ICD-10-CM | POA: Diagnosis not present

## 2018-09-27 DIAGNOSIS — N2581 Secondary hyperparathyroidism of renal origin: Secondary | ICD-10-CM | POA: Diagnosis not present

## 2018-09-27 DIAGNOSIS — D631 Anemia in chronic kidney disease: Secondary | ICD-10-CM | POA: Diagnosis not present

## 2018-09-29 ENCOUNTER — Encounter (HOSPITAL_COMMUNITY): Payer: Self-pay

## 2018-09-29 ENCOUNTER — Emergency Department (HOSPITAL_COMMUNITY)
Admission: EM | Admit: 2018-09-29 | Discharge: 2018-09-30 | Disposition: A | Discharge status: Home / Self Care | Payer: Medicare Other | Attending: Emergency Medicine | Admitting: Emergency Medicine

## 2018-09-29 DIAGNOSIS — K625 Hemorrhage of anus and rectum: Secondary | ICD-10-CM | POA: Diagnosis not present

## 2018-09-29 DIAGNOSIS — I110 Hypertensive heart disease with heart failure: Secondary | ICD-10-CM | POA: Insufficient documentation

## 2018-09-29 DIAGNOSIS — K529 Noninfective gastroenteritis and colitis, unspecified: Secondary | ICD-10-CM

## 2018-09-29 DIAGNOSIS — I5042 Chronic combined systolic (congestive) and diastolic (congestive) heart failure: Secondary | ICD-10-CM | POA: Diagnosis not present

## 2018-09-29 DIAGNOSIS — N186 End stage renal disease: Secondary | ICD-10-CM | POA: Diagnosis not present

## 2018-09-29 DIAGNOSIS — Z992 Dependence on renal dialysis: Secondary | ICD-10-CM | POA: Diagnosis not present

## 2018-09-29 DIAGNOSIS — K5730 Diverticulosis of large intestine without perforation or abscess without bleeding: Secondary | ICD-10-CM | POA: Diagnosis not present

## 2018-09-29 DIAGNOSIS — F17210 Nicotine dependence, cigarettes, uncomplicated: Secondary | ICD-10-CM | POA: Diagnosis not present

## 2018-09-29 LAB — COMPREHENSIVE METABOLIC PANEL
ALT: 10 U/L (ref 0–44)
AST: 20 U/L (ref 15–41)
Albumin: 2.6 g/dL — ABNORMAL LOW (ref 3.5–5.0)
Alkaline Phosphatase: 179 U/L — ABNORMAL HIGH (ref 38–126)
Anion gap: 13 (ref 5–15)
BUN: 21 mg/dL (ref 8–23)
CO2: 26 mmol/L (ref 22–32)
Calcium: 9.1 mg/dL (ref 8.9–10.3)
Chloride: 92 mmol/L — ABNORMAL LOW (ref 98–111)
Creatinine, Ser: 5.3 mg/dL — ABNORMAL HIGH (ref 0.44–1.00)
GFR calc Af Amer: 9 mL/min — ABNORMAL LOW (ref >60)
GFR calc non Af Amer: 8 mL/min — ABNORMAL LOW (ref >60)
Glucose, Bld: 105 mg/dL — ABNORMAL HIGH (ref 70–99)
Potassium: 3.4 mmol/L — ABNORMAL LOW (ref 3.5–5.1)
Sodium: 131 mmol/L — ABNORMAL LOW (ref 135–145)
Total Bilirubin: 1.3 mg/dL — ABNORMAL HIGH (ref 0.3–1.2)
Total Protein: 7.6 g/dL (ref 6.5–8.1)

## 2018-09-29 LAB — TYPE AND SCREEN
ABO/RH(D): O POS
Antibody Screen: NEGATIVE

## 2018-09-29 LAB — CBC
HCT: 33.7 % — ABNORMAL LOW (ref 36.0–46.0)
Hemoglobin: 10.9 g/dL — ABNORMAL LOW (ref 12.0–15.0)
MCH: 32.7 pg (ref 26.0–34.0)
MCHC: 32.3 g/dL (ref 30.0–36.0)
MCV: 101.2 fL — ABNORMAL HIGH (ref 80.0–100.0)
Platelets: 117 10*3/uL — ABNORMAL LOW (ref 150–400)
RBC: 3.33 MIL/uL — ABNORMAL LOW (ref 3.87–5.11)
RDW: 17 % — ABNORMAL HIGH (ref 11.5–15.5)
WBC: 4.3 10*3/uL (ref 4.0–10.5)
nRBC: 0 % (ref 0.0–0.2)

## 2018-09-29 NOTE — ED Notes (Signed)
Attempted IV stick x 2  

## 2018-09-29 NOTE — ED Provider Notes (Signed)
Emergency Department Provider Note   I have reviewed the triage vital signs and the nursing notes.   HISTORY  Chief Complaint Rectal Bleeding   HPI Jessica Clarke is a 68 y.o. female with multiple medical problems documented below who presents the emergency department today secondary to dark red stools.  Patient states she had 2 episodes today where she had dark red stools with some bright red blood with it.  She had some small clots in there as well.  No lightheadedness, dizziness, shortness of breath, weakness or syncope.  Patient states she feels at baseline at this time from that standpoint.  States she had colonoscopy in the past where she thought she had polyps.  No known cancer.  Not had any issues like this before.  Not on any anticoagulants at home.   No other associated or modifying symptoms.    Past Medical History:  Diagnosis Date   Abnormal Pap smear    ASCUS ?HGSIL   Anemia    Cervical dysplasia    CHF (congestive heart failure) ([facility])    Chronic kidney disease    Constipation    Constipation    GERD (gastroesophageal reflux disease)    Gout    Hyperlipidemia    Hypertension    Myocardial infarction ([facility]) 2015   Obesity    Pneumonia    Uterine polyp     Patient Active Problem List   Diagnosis Date Noted   Lung nodule < 6cm on CT 03/05/2018   Malnutrition of moderate degree 08/28/2016   Respiratory distress 08/26/2016   ESRD on dialysis ([place]) 06/08/2015   CAD- non obstructive RI disease 08/26/13 08/27/2013   NSTEMI-08/25/13- non obstructive CAD at cath 08/26/13 08/26/2013   Chronic combined systolic and diastolic heart failure ([place]) 08/26/2013   Alcohol abuse 08/25/2013   Respiratory failure with hypoxia and hypercapnia ([place]) 08/25/2013   Flash pulmonary edema ([place]) 08/25/2013   Encounter for adequacy testing for hemodialysis ([place]) 06/26/2012   End stage renal disease ([place]) 12/27/2011   ASCUS (atypical squamous cells of  undetermined significance) on Pap smear 09/09/2010   Hypertension 09/09/2010   Cervical polyp 09/09/2010   Smoker 09/09/2010   Post-menopausal bleeding 09/09/2010   Gout 09/09/2010    Past Surgical History:  Procedure Laterality Date   AV FISTULA PLACEMENT  01/18/2012   Procedure: ARTERIOVENOUS (AV) FISTULA CREATION;  Surgeon: Angelia Mould, MD;  Location: [facility] OR;  Service: Vascular;  Laterality: Left;   DILATION AND CURETTAGE OF UTERUS     HYSTEROSCOPY     LEEP     LEFT HEART CATHETERIZATION WITH CORONARY ANGIOGRAM N/A 08/26/2013   Procedure: LEFT HEART CATHETERIZATION WITH CORONARY ANGIOGRAM;  Surgeon: Leonie Man, MD;  Location[facility] CATH LAB;  Service: Cardiovascular;  Laterality: N/A;   REVISON OF ARTERIOVENOUS FISTULA Left 4/00/8676   Procedure: PLICATION OF ARTERIOVENOUS FISTULA;  Surgeon: Conrad Wallace, MD;  Location: [facility];  Service: Vascular;  Laterality: Left;    Current Outpatient Rx   Order #: [id number] Class: Historical Med   Order #: [id number] Class: Normal   Order #: [id number] Class: Normal   Order #: [id number] Class: Historical Med   Order #: [id number] Class: Historical Med   Order #: [id number] Class: Historical Med   Order #: [id number] Class: Historical Med   Order #: [id number] Class: Print    Allergies No known allergies  Family History  Problem Relation Age of Onset   Esophageal cancer Other    Pancreatic cancer Other    Heart disease Mother  Hypertension Mother    Breast cancer Sister    Esophageal cancer Sister    Cancer Sister    Deep vein thrombosis Sister    Diabetes Sister    Hypertension Sister    Hypertension Daughter    Colon cancer Neg Hx     Social History Social History   Tobacco Use   Smoking status: Current Every Day Smoker    Packs/day: 0.50    Years: 30.00    Pack years: 15.00    Types: Cigarettes   Smokeless tobacco: Never Used  Substance Use Topics   Alcohol use: Yes    Alcohol/week:  6.0 standard drinks    Types: 6 Cans of beer per week   Drug use: No    Review of Systems  All other systems negative except as documented in the HPI. All pertinent positives and negatives as reviewed in the HPI. ____________________________________________   PHYSICAL EXAM:  VITAL SIGNS: ED Triage Vitals  Enc Vitals Group     BP 09/29/18 2146 (!) 157/89     Pulse Rate 09/29/18 2146 70     Resp 09/29/18 2146 20     Temp 09/29/18 2146 98 F (36.7 C)     Temp Source 09/29/18 2146 Oral     SpO2 09/29/18 2146 95 %   Constitutional: Alert and oriented. Well appearing and in no acute distress. Eyes: Conjunctivae are normal. PERRL. EOMI. Head: Atraumatic. Nose: No congestion/rhinnorhea. Mouth/Throat: Mucous membranes are moist.  Oropharynx non-erythematous. Neck: No stridor.  No meningeal signs.   Cardiovascular: Normal rate, regular rhythm. Good peripheral circulation. Grossly normal heart sounds.   Respiratory: Normal respiratory effort.  No retractions. Lungs CTAB. Gastrointestinal: Soft and nontender. No distention.  Musculoskeletal: No lower extremity tenderness nor edema. No gross deformities of extremities. Neurologic:  Normal speech and language. No gross focal neurologic deficits are appreciated.  Skin:  Skin is warm, dry and intact. No rash noted.  ____________________________________________   LABS (all labs ordered are listed, but only abnormal results are displayed)  Labs Reviewed  COMPREHENSIVE METABOLIC PANEL - Abnormal; Notable for the following components:      Result Value   Sodium 131 (*)    Potassium 3.4 (*)    Chloride 92 (*)    Glucose, Bld 105 (*)    Creatinine, Ser 5.30 (*)    Albumin 2.6 (*)    Alkaline Phosphatase 179 (*)    Total Bilirubin 1.3 (*)    GFR calc non Af Amer 8 (*)    GFR calc Af Amer 9 (*)    All other components within normal limits  CBC - Abnormal; Notable for the following components:   RBC 3.33 (*)    Hemoglobin 10.9 (*)      HCT 33.7 (*)    MCV 101.2 (*)    RDW 17.0 (*)    Platelets 117 (*)    All other components within normal limits  POC OCCULT BLOOD, ED  POC OCCULT BLOOD, ED  TYPE AND SCREEN  ABO/RH   ____________________________________________  RADIOLOGY  Ct Abdomen Pelvis Wo Contrast  Result Date: 09/30/2018 CLINICAL DATA:  GI bleed. Dialysis patient. EXAM: CT ABDOMEN AND PELVIS WITHOUT CONTRAST TECHNIQUE: Multidetector CT imaging of the abdomen and pelvis was performed following the standard protocol without IV contrast. COMPARISON:  PET CT 02/26/2018 FINDINGS: Lower chest: Cardiomegaly with trace pericardial effusion. Coronary artery calcifications. Small right pleural effusion. Interstitial thickening at the bases, chronic. Hepatobiliary: No evidence of focal hepatic lesion on  noncontrast exam. There is periportal edema. Gallbladder is decompressed. No calcified gallstone. Common bile duct not well-defined. Pancreas: No ductal dilatation. Generalized edema throughout the abdomen and pelvis without increase around the pancreas to suggest pancreatitis. Spleen: Normal in size without focal abnormality. Adrenals/Urinary Tract: Left adrenal thickening, stable. Normal right adrenal gland. Atrophic kidneys with small bilateral cysts. Urinary bladder is minimally distended but thick walled. Stomach/Bowel: Mild colonic wall thickening involving the cecum in ascending colon. Colonic diverticulosis most prominent distally. No diverticulitis. No obstruction, administered enteric contrast reaches the colon. No small bowel wall thickening or inflammatory change. Stomach is unremarkable. Vascular/Lymphatic: Advanced aortic atherosclerosis. No aneurysm. Limited assessment for adenopathy given lack of IV contrast, ascites, and generalized edema. No bulky abdominopelvic adenopathy. Reproductive: Uterine calcifications, otherwise unremarkable. No obvious adnexal mass. Other: Small volume abdominopelvic ascites. Generalized soft  tissue edema, involving both mesentery and subcutaneous tissues. Asymmetric edema in the right greater than left lower abdominal wall. No free intra-abdominal air. Musculoskeletal: Findings of renal osteodystrophy with ground-glass increased bone density. Multiple Schmorl's nodes in the lumbar spine. Degenerative disc disease at L5-S1. IMPRESSION: 1. Findings suggestive of ascending colitis, may be infectious or inflammatory. 2. Colonic diverticulosis without focal diverticulitis. 3. Third-spacing/volume overload with generalized soft tissue edema throughout the abdomen and pelvis with small volume abdominopelvic ascites. Small right pleural effusion has slightly increased compared to to prior PET. 4. Advanced aortic atherosclerosis.  Coronary artery calcifications. Aortic Atherosclerosis (ICD10-I70.0). Electronically Signed   By: Keith Rake M.D.   On: 09/30/2018 03:49    ____________________________________________   PROCEDURES  Procedure(s) performed:   Procedures   ____________________________________________   INITIAL IMPRESSION / ASSESSMENT AND PLAN / ED COURSE  Patient overall appears well.  Will check orthostatics and monitor vital signs were little bit and she continues to be stable with a normal hemoglobin and normal vital signs I don't see any indication for admission at this time.  Rectal bleeding likely secondary to colitis.  Will have her follow-up with her GI after round of antibiotics.     Pertinent labs & imaging results that were available during my care of the patient were reviewed by me and considered in my medical decision making (see chart for details).  A medical screening exam was performed and I feel the patient has had an appropriate workup for their chief complaint at this time and likelihood of emergent condition existing is low. They have been counseled on decision, discharge, follow up and which symptoms necessitate immediate return to the emergency  department. They or their family verbally stated understanding and agreement with plan and discharged in stable condition.   ____________________________________________  FINAL CLINICAL IMPRESSION(S) / ED DIAGNOSES  Final diagnoses:  Rectal bleeding  Colitis     MEDICATIONS GIVEN DURING THIS VISIT:  Medications  amoxicillin-clavulanate (AUGMENTIN) 875-125 MG per tablet 1 tablet (1 tablet Oral Given 09/30/18 0409)     NEW OUTPATIENT MEDICATIONS STARTED DURING THIS VISIT:  Discharge Medication List as of 09/30/2018  4:02 AM    START taking these medications   Details  amoxicillin-clavulanate (AUGMENTIN) 500-125 MG tablet Take 1 tablet (500 mg total) by mouth See admin instructions. Take one daily. On days of dialysis take an additional dose during and after dialysis., Starting Mon 09/30/2018, Print        Note:  This note was prepared with assistance of Dragon voice recognition software. Occasional wrong-word or sound-a-like substitutions may have occurred due to the inherent limitations of voice recognition software.   Pearson Reasons,  Corene Cornea, MD 09/30/18 [phone]

## 2018-09-29 NOTE — ED Triage Notes (Signed)
Pt states that she began to have dark red stools this afternoon around 1pm. Pt is a dialysis pt

## 2018-09-30 ENCOUNTER — Emergency Department (HOSPITAL_COMMUNITY): Payer: Medicare Other

## 2018-09-30 DIAGNOSIS — K5730 Diverticulosis of large intestine without perforation or abscess without bleeding: Secondary | ICD-10-CM | POA: Diagnosis not present

## 2018-09-30 DIAGNOSIS — K625 Hemorrhage of anus and rectum: Secondary | ICD-10-CM | POA: Diagnosis not present

## 2018-09-30 LAB — ABO/RH: ABO/RH(D): O POS

## 2018-09-30 MED ORDER — AMOXICILLIN-POT CLAVULANATE 500-125 MG PO TABS: 1.0000 | ORAL_TABLET | ORAL | 0 refills | Status: DC

## 2018-09-30 MED ORDER — AMOXICILLIN-POT CLAVULANATE 875-125 MG PO TABS
1.0000 | ORAL_TABLET | Freq: Once | ORAL | Status: AC
  Administered 2018-09-30: 1 via ORAL
  Filled 2018-09-30: qty 1

## 2018-10-04 DIAGNOSIS — Z992 Dependence on renal dialysis: Secondary | ICD-10-CM | POA: Diagnosis not present

## 2018-10-04 DIAGNOSIS — D631 Anemia in chronic kidney disease: Secondary | ICD-10-CM | POA: Diagnosis not present

## 2018-10-04 DIAGNOSIS — N186 End stage renal disease: Secondary | ICD-10-CM | POA: Diagnosis not present

## 2018-10-04 DIAGNOSIS — N2581 Secondary hyperparathyroidism of renal origin: Secondary | ICD-10-CM | POA: Diagnosis not present

## 2018-10-07 DIAGNOSIS — N2581 Secondary hyperparathyroidism of renal origin: Secondary | ICD-10-CM | POA: Diagnosis not present

## 2018-10-07 DIAGNOSIS — Z992 Dependence on renal dialysis: Secondary | ICD-10-CM | POA: Diagnosis not present

## 2018-10-07 DIAGNOSIS — D631 Anemia in chronic kidney disease: Secondary | ICD-10-CM | POA: Diagnosis not present

## 2018-10-07 DIAGNOSIS — N186 End stage renal disease: Secondary | ICD-10-CM | POA: Diagnosis not present

## 2018-10-09 DIAGNOSIS — N2581 Secondary hyperparathyroidism of renal origin: Secondary | ICD-10-CM | POA: Diagnosis not present

## 2018-10-09 DIAGNOSIS — D631 Anemia in chronic kidney disease: Secondary | ICD-10-CM | POA: Diagnosis not present

## 2018-10-09 DIAGNOSIS — Z992 Dependence on renal dialysis: Secondary | ICD-10-CM | POA: Diagnosis not present

## 2018-10-09 DIAGNOSIS — N186 End stage renal disease: Secondary | ICD-10-CM | POA: Diagnosis not present

## 2018-10-14 DIAGNOSIS — N186 End stage renal disease: Secondary | ICD-10-CM | POA: Diagnosis not present

## 2018-10-14 DIAGNOSIS — Z992 Dependence on renal dialysis: Secondary | ICD-10-CM | POA: Diagnosis not present

## 2018-10-14 DIAGNOSIS — N2581 Secondary hyperparathyroidism of renal origin: Secondary | ICD-10-CM | POA: Diagnosis not present

## 2018-10-14 DIAGNOSIS — D631 Anemia in chronic kidney disease: Secondary | ICD-10-CM | POA: Diagnosis not present

## 2018-10-15 DIAGNOSIS — N2889 Other specified disorders of kidney and ureter: Secondary | ICD-10-CM | POA: Diagnosis not present

## 2018-10-15 DIAGNOSIS — Z992 Dependence on renal dialysis: Secondary | ICD-10-CM | POA: Diagnosis not present

## 2018-10-15 DIAGNOSIS — N186 End stage renal disease: Secondary | ICD-10-CM | POA: Diagnosis not present

## 2018-10-16 DIAGNOSIS — D631 Anemia in chronic kidney disease: Secondary | ICD-10-CM | POA: Diagnosis not present

## 2018-10-16 DIAGNOSIS — D509 Iron deficiency anemia, unspecified: Secondary | ICD-10-CM | POA: Diagnosis not present

## 2018-10-16 DIAGNOSIS — Z23 Encounter for immunization: Secondary | ICD-10-CM | POA: Diagnosis not present

## 2018-10-16 DIAGNOSIS — N186 End stage renal disease: Secondary | ICD-10-CM | POA: Diagnosis not present

## 2018-10-16 DIAGNOSIS — Z992 Dependence on renal dialysis: Secondary | ICD-10-CM | POA: Diagnosis not present

## 2018-10-16 DIAGNOSIS — N2581 Secondary hyperparathyroidism of renal origin: Secondary | ICD-10-CM | POA: Diagnosis not present

## 2018-10-17 ENCOUNTER — Other Ambulatory Visit: Payer: Self-pay

## 2018-10-17 ENCOUNTER — Inpatient Hospital Stay (HOSPITAL_COMMUNITY)
Admission: EM | Admit: 2018-10-17 | Discharge: 2018-10-26 | DRG: 371 | Disposition: A | Discharge status: Home Health | Payer: Medicare Other | Attending: Internal Medicine | Admitting: Internal Medicine

## 2018-10-17 DIAGNOSIS — Z8249 Family history of ischemic heart disease and other diseases of the circulatory system: Secondary | ICD-10-CM

## 2018-10-17 DIAGNOSIS — E871 Hypo-osmolality and hyponatremia: Secondary | ICD-10-CM | POA: Diagnosis not present

## 2018-10-17 DIAGNOSIS — E162 Hypoglycemia, unspecified: Secondary | ICD-10-CM | POA: Diagnosis present

## 2018-10-17 DIAGNOSIS — K5730 Diverticulosis of large intestine without perforation or abscess without bleeding: Secondary | ICD-10-CM | POA: Diagnosis present

## 2018-10-17 DIAGNOSIS — C3411 Malignant neoplasm of upper lobe, right bronchus or lung: Secondary | ICD-10-CM | POA: Diagnosis present

## 2018-10-17 DIAGNOSIS — I5032 Chronic diastolic (congestive) heart failure: Secondary | ICD-10-CM | POA: Diagnosis not present

## 2018-10-17 DIAGNOSIS — Z992 Dependence on renal dialysis: Secondary | ICD-10-CM

## 2018-10-17 DIAGNOSIS — N2581 Secondary hyperparathyroidism of renal origin: Secondary | ICD-10-CM | POA: Diagnosis present

## 2018-10-17 DIAGNOSIS — Z20828 Contact with and (suspected) exposure to other viral communicable diseases: Secondary | ICD-10-CM | POA: Diagnosis not present

## 2018-10-17 DIAGNOSIS — W1830XA Fall on same level, unspecified, initial encounter: Secondary | ICD-10-CM | POA: Diagnosis present

## 2018-10-17 DIAGNOSIS — M898X9 Other specified disorders of bone, unspecified site: Secondary | ICD-10-CM | POA: Diagnosis present

## 2018-10-17 DIAGNOSIS — I2510 Atherosclerotic heart disease of native coronary artery without angina pectoris: Secondary | ICD-10-CM | POA: Diagnosis present

## 2018-10-17 DIAGNOSIS — I959 Hypotension, unspecified: Secondary | ICD-10-CM | POA: Diagnosis not present

## 2018-10-17 DIAGNOSIS — N186 End stage renal disease: Secondary | ICD-10-CM | POA: Diagnosis not present

## 2018-10-17 DIAGNOSIS — M349 Systemic sclerosis, unspecified: Secondary | ICD-10-CM | POA: Diagnosis present

## 2018-10-17 DIAGNOSIS — F17210 Nicotine dependence, cigarettes, uncomplicated: Secondary | ICD-10-CM | POA: Diagnosis present

## 2018-10-17 DIAGNOSIS — I10 Essential (primary) hypertension: Secondary | ICD-10-CM | POA: Diagnosis not present

## 2018-10-17 DIAGNOSIS — A040 Enteropathogenic Escherichia coli infection: Secondary | ICD-10-CM | POA: Diagnosis not present

## 2018-10-17 DIAGNOSIS — E46 Unspecified protein-calorie malnutrition: Secondary | ICD-10-CM | POA: Diagnosis present

## 2018-10-17 DIAGNOSIS — K219 Gastro-esophageal reflux disease without esophagitis: Secondary | ICD-10-CM | POA: Diagnosis present

## 2018-10-17 DIAGNOSIS — Z803 Family history of malignant neoplasm of breast: Secondary | ICD-10-CM

## 2018-10-17 DIAGNOSIS — E876 Hypokalemia: Secondary | ICD-10-CM | POA: Diagnosis present

## 2018-10-17 DIAGNOSIS — D649 Anemia, unspecified: Secondary | ICD-10-CM | POA: Diagnosis not present

## 2018-10-17 DIAGNOSIS — M109 Gout, unspecified: Secondary | ICD-10-CM | POA: Diagnosis present

## 2018-10-17 DIAGNOSIS — D631 Anemia in chronic kidney disease: Secondary | ICD-10-CM | POA: Diagnosis present

## 2018-10-17 DIAGNOSIS — D696 Thrombocytopenia, unspecified: Secondary | ICD-10-CM | POA: Diagnosis not present

## 2018-10-17 DIAGNOSIS — R188 Other ascites: Secondary | ICD-10-CM | POA: Diagnosis present

## 2018-10-17 DIAGNOSIS — Z7982 Long term (current) use of aspirin: Secondary | ICD-10-CM

## 2018-10-17 DIAGNOSIS — D500 Iron deficiency anemia secondary to blood loss (chronic): Secondary | ICD-10-CM | POA: Diagnosis present

## 2018-10-17 DIAGNOSIS — Z800 Family history of malignant neoplasm of digestive organs: Secondary | ICD-10-CM

## 2018-10-17 DIAGNOSIS — F17200 Nicotine dependence, unspecified, uncomplicated: Secondary | ICD-10-CM | POA: Diagnosis present

## 2018-10-17 DIAGNOSIS — Y92511 Restaurant or cafe as the place of occurrence of the external cause: Secondary | ICD-10-CM

## 2018-10-17 DIAGNOSIS — E785 Hyperlipidemia, unspecified: Secondary | ICD-10-CM | POA: Diagnosis present

## 2018-10-17 DIAGNOSIS — K921 Melena: Secondary | ICD-10-CM | POA: Diagnosis not present

## 2018-10-17 DIAGNOSIS — Z03818 Encounter for observation for suspected exposure to other biological agents ruled out: Secondary | ICD-10-CM | POA: Diagnosis not present

## 2018-10-17 DIAGNOSIS — R197 Diarrhea, unspecified: Secondary | ICD-10-CM | POA: Diagnosis present

## 2018-10-17 DIAGNOSIS — I252 Old myocardial infarction: Secondary | ICD-10-CM

## 2018-10-17 DIAGNOSIS — I132 Hypertensive heart and chronic kidney disease with heart failure and with stage 5 chronic kidney disease, or end stage renal disease: Secondary | ICD-10-CM | POA: Diagnosis present

## 2018-10-17 DIAGNOSIS — D175 Benign lipomatous neoplasm of intra-abdominal organs: Secondary | ICD-10-CM | POA: Diagnosis present

## 2018-10-17 DIAGNOSIS — I700 Atherosclerosis of aorta: Secondary | ICD-10-CM | POA: Diagnosis present

## 2018-10-17 DIAGNOSIS — Z833 Family history of diabetes mellitus: Secondary | ICD-10-CM

## 2018-10-17 DIAGNOSIS — S0181XA Laceration without foreign body of other part of head, initial encounter: Secondary | ICD-10-CM | POA: Diagnosis present

## 2018-10-17 DIAGNOSIS — K625 Hemorrhage of anus and rectum: Secondary | ICD-10-CM | POA: Diagnosis present

## 2018-10-17 DIAGNOSIS — K635 Polyp of colon: Secondary | ICD-10-CM

## 2018-10-17 LAB — COMPREHENSIVE METABOLIC PANEL
ALT: 10 U/L (ref 0–44)
AST: 21 U/L (ref 15–41)
Albumin: 2.5 g/dL — ABNORMAL LOW (ref 3.5–5.0)
Alkaline Phosphatase: 141 U/L — ABNORMAL HIGH (ref 38–126)
Anion gap: 13 (ref 5–15)
BUN: 16 mg/dL (ref 8–23)
CO2: 28 mmol/L (ref 22–32)
Calcium: 8.7 mg/dL — ABNORMAL LOW (ref 8.9–10.3)
Chloride: 94 mmol/L — ABNORMAL LOW (ref 98–111)
Creatinine, Ser: 4.44 mg/dL — ABNORMAL HIGH (ref 0.44–1.00)
GFR calc Af Amer: 11 mL/min — ABNORMAL LOW (ref >60)
GFR calc non Af Amer: 9 mL/min — ABNORMAL LOW (ref >60)
Glucose, Bld: 95 mg/dL (ref 70–99)
Potassium: 2.8 mmol/L — ABNORMAL LOW (ref 3.5–5.1)
Sodium: 135 mmol/L (ref 135–145)
Total Bilirubin: 1.1 mg/dL (ref 0.3–1.2)
Total Protein: 7.4 g/dL (ref 6.5–8.1)

## 2018-10-17 LAB — CBC
HCT: 21.6 % — ABNORMAL LOW (ref 36.0–46.0)
Hemoglobin: 6.6 g/dL — CL (ref 12.0–15.0)
MCH: 32.4 pg (ref 26.0–34.0)
MCHC: 30.6 g/dL (ref 30.0–36.0)
MCV: 105.9 fL — ABNORMAL HIGH (ref 80.0–100.0)
Platelets: 145 10*3/uL — ABNORMAL LOW (ref 150–400)
RBC: 2.04 MIL/uL — ABNORMAL LOW (ref 3.87–5.11)
RDW: 18.6 % — ABNORMAL HIGH (ref 11.5–15.5)
WBC: 4.5 10*3/uL (ref 4.0–10.5)
nRBC: 0.7 % — ABNORMAL HIGH (ref 0.0–0.2)

## 2018-10-17 LAB — POC OCCULT BLOOD, ED: Fecal Occult Bld: NEGATIVE

## 2018-10-17 LAB — PREPARE RBC (CROSSMATCH)

## 2018-10-17 MED ORDER — ACETAMINOPHEN 325 MG PO TABS
650.0000 mg | ORAL_TABLET | Freq: Four times a day (QID) | ORAL | Status: DC | PRN
  Administered 2018-10-23: 22:00:00 650 mg via ORAL
  Filled 2018-10-17: qty 2

## 2018-10-17 MED ORDER — SODIUM CHLORIDE 0.9% FLUSH
3.0000 mL | INTRAVENOUS | Status: DC | PRN
  Administered 2018-10-25: 12:00:00 3 mL via INTRAVENOUS
  Filled 2018-10-17: qty 3

## 2018-10-17 MED ORDER — PRO-STAT SUGAR FREE PO LIQD
30.0000 mL | Freq: Two times a day (BID) | ORAL | Status: DC
  Administered 2018-10-18 – 2018-10-26 (×16): 30 mL via ORAL
  Filled 2018-10-17 (×16): qty 30

## 2018-10-17 MED ORDER — SODIUM CHLORIDE 0.9 % IV SOLN
10.0000 mL/h | Freq: Once | INTRAVENOUS | Status: AC
  Administered 2018-10-17: 22:00:00 10 mL/h via INTRAVENOUS

## 2018-10-17 MED ORDER — SODIUM CHLORIDE 0.9% FLUSH
3.0000 mL | Freq: Two times a day (BID) | INTRAVENOUS | Status: DC
  Administered 2018-10-17 – 2018-10-26 (×17): 3 mL via INTRAVENOUS

## 2018-10-17 MED ORDER — ACETAMINOPHEN 650 MG RE SUPP: 650.0000 mg | Freq: Four times a day (QID) | RECTAL | Status: DC | PRN

## 2018-10-17 MED ORDER — SODIUM CHLORIDE 0.9 % IV SOLN
250.0000 mL | INTRAVENOUS | Status: DC | PRN
  Administered 2018-10-20: 09:00:00 via INTRAVENOUS

## 2018-10-17 NOTE — ED Notes (Signed)
Called pt to recheck vitals. No response.

## 2018-10-17 NOTE — ED Notes (Signed)
pT was not answering her name to go to a room so this nurse tried to call her phone number and left a voice message that is was very important to return for treatment. Left my number

## 2018-10-17 NOTE — ED Triage Notes (Signed)
Pt presents with ongoing black stool x2 weeks, recently seen here for the same, reports she has some blood in her stool now.

## 2018-10-17 NOTE — ED Notes (Signed)
2x calling pt to recheck vitals. No response.

## 2018-10-17 NOTE — ED Notes (Signed)
[place] pts daughter please update

## 2018-10-18 ENCOUNTER — Observation Stay (HOSPITAL_COMMUNITY): Payer: Medicare Other

## 2018-10-18 ENCOUNTER — Inpatient Hospital Stay (HOSPITAL_COMMUNITY): Payer: Medicare Other

## 2018-10-18 DIAGNOSIS — I10 Essential (primary) hypertension: Secondary | ICD-10-CM | POA: Diagnosis not present

## 2018-10-18 DIAGNOSIS — E46 Unspecified protein-calorie malnutrition: Secondary | ICD-10-CM | POA: Diagnosis present

## 2018-10-18 DIAGNOSIS — K219 Gastro-esophageal reflux disease without esophagitis: Secondary | ICD-10-CM | POA: Diagnosis present

## 2018-10-18 DIAGNOSIS — R188 Other ascites: Secondary | ICD-10-CM | POA: Diagnosis present

## 2018-10-18 DIAGNOSIS — I340 Nonrheumatic mitral (valve) insufficiency: Secondary | ICD-10-CM | POA: Diagnosis not present

## 2018-10-18 DIAGNOSIS — Z800 Family history of malignant neoplasm of digestive organs: Secondary | ICD-10-CM | POA: Diagnosis not present

## 2018-10-18 DIAGNOSIS — N186 End stage renal disease: Secondary | ICD-10-CM | POA: Diagnosis not present

## 2018-10-18 DIAGNOSIS — Z803 Family history of malignant neoplasm of breast: Secondary | ICD-10-CM | POA: Diagnosis not present

## 2018-10-18 DIAGNOSIS — R911 Solitary pulmonary nodule: Secondary | ICD-10-CM | POA: Diagnosis not present

## 2018-10-18 DIAGNOSIS — G9340 Encephalopathy, unspecified: Secondary | ICD-10-CM | POA: Diagnosis not present

## 2018-10-18 DIAGNOSIS — D124 Benign neoplasm of descending colon: Secondary | ICD-10-CM | POA: Diagnosis not present

## 2018-10-18 DIAGNOSIS — F17210 Nicotine dependence, cigarettes, uncomplicated: Secondary | ICD-10-CM | POA: Diagnosis present

## 2018-10-18 DIAGNOSIS — I132 Hypertensive heart and chronic kidney disease with heart failure and with stage 5 chronic kidney disease, or end stage renal disease: Secondary | ICD-10-CM | POA: Diagnosis not present

## 2018-10-18 DIAGNOSIS — K635 Polyp of colon: Secondary | ICD-10-CM | POA: Diagnosis not present

## 2018-10-18 DIAGNOSIS — K625 Hemorrhage of anus and rectum: Secondary | ICD-10-CM | POA: Diagnosis present

## 2018-10-18 DIAGNOSIS — Z8249 Family history of ischemic heart disease and other diseases of the circulatory system: Secondary | ICD-10-CM | POA: Diagnosis not present

## 2018-10-18 DIAGNOSIS — A040 Enteropathogenic Escherichia coli infection: Secondary | ICD-10-CM | POA: Diagnosis present

## 2018-10-18 DIAGNOSIS — I959 Hypotension, unspecified: Secondary | ICD-10-CM | POA: Diagnosis not present

## 2018-10-18 DIAGNOSIS — I361 Nonrheumatic tricuspid (valve) insufficiency: Secondary | ICD-10-CM | POA: Diagnosis not present

## 2018-10-18 DIAGNOSIS — D631 Anemia in chronic kidney disease: Secondary | ICD-10-CM | POA: Diagnosis not present

## 2018-10-18 DIAGNOSIS — C3411 Malignant neoplasm of upper lobe, right bronchus or lung: Secondary | ICD-10-CM | POA: Diagnosis not present

## 2018-10-18 DIAGNOSIS — I5032 Chronic diastolic (congestive) heart failure: Secondary | ICD-10-CM | POA: Diagnosis present

## 2018-10-18 DIAGNOSIS — E785 Hyperlipidemia, unspecified: Secondary | ICD-10-CM | POA: Diagnosis present

## 2018-10-18 DIAGNOSIS — R0989 Other specified symptoms and signs involving the circulatory and respiratory systems: Secondary | ICD-10-CM | POA: Diagnosis not present

## 2018-10-18 DIAGNOSIS — K922 Gastrointestinal hemorrhage, unspecified: Secondary | ICD-10-CM | POA: Diagnosis not present

## 2018-10-18 DIAGNOSIS — I120 Hypertensive chronic kidney disease with stage 5 chronic kidney disease or end stage renal disease: Secondary | ICD-10-CM | POA: Diagnosis not present

## 2018-10-18 DIAGNOSIS — K6389 Other specified diseases of intestine: Secondary | ICD-10-CM | POA: Diagnosis not present

## 2018-10-18 DIAGNOSIS — F17200 Nicotine dependence, unspecified, uncomplicated: Secondary | ICD-10-CM | POA: Diagnosis not present

## 2018-10-18 DIAGNOSIS — I517 Cardiomegaly: Secondary | ICD-10-CM | POA: Diagnosis not present

## 2018-10-18 DIAGNOSIS — R4020 Unspecified coma: Secondary | ICD-10-CM | POA: Diagnosis not present

## 2018-10-18 DIAGNOSIS — K921 Melena: Secondary | ICD-10-CM | POA: Diagnosis not present

## 2018-10-18 DIAGNOSIS — Z515 Encounter for palliative care: Secondary | ICD-10-CM | POA: Diagnosis not present

## 2018-10-18 DIAGNOSIS — D120 Benign neoplasm of cecum: Secondary | ICD-10-CM | POA: Diagnosis not present

## 2018-10-18 DIAGNOSIS — Z20828 Contact with and (suspected) exposure to other viral communicable diseases: Secondary | ICD-10-CM | POA: Diagnosis present

## 2018-10-18 DIAGNOSIS — S0990XA Unspecified injury of head, initial encounter: Secondary | ICD-10-CM | POA: Diagnosis not present

## 2018-10-18 DIAGNOSIS — J90 Pleural effusion, not elsewhere classified: Secondary | ICD-10-CM | POA: Diagnosis not present

## 2018-10-18 DIAGNOSIS — R197 Diarrhea, unspecified: Secondary | ICD-10-CM | POA: Diagnosis not present

## 2018-10-18 DIAGNOSIS — Z7189 Other specified counseling: Secondary | ICD-10-CM | POA: Diagnosis not present

## 2018-10-18 DIAGNOSIS — Y92511 Restaurant or cafe as the place of occurrence of the external cause: Secondary | ICD-10-CM | POA: Diagnosis not present

## 2018-10-18 DIAGNOSIS — I2510 Atherosclerotic heart disease of native coronary artery without angina pectoris: Secondary | ICD-10-CM | POA: Diagnosis present

## 2018-10-18 DIAGNOSIS — Z992 Dependence on renal dialysis: Secondary | ICD-10-CM

## 2018-10-18 DIAGNOSIS — D649 Anemia, unspecified: Secondary | ICD-10-CM | POA: Diagnosis not present

## 2018-10-18 DIAGNOSIS — K5730 Diverticulosis of large intestine without perforation or abscess without bleeding: Secondary | ICD-10-CM | POA: Diagnosis not present

## 2018-10-18 DIAGNOSIS — I700 Atherosclerosis of aorta: Secondary | ICD-10-CM | POA: Diagnosis present

## 2018-10-18 DIAGNOSIS — M109 Gout, unspecified: Secondary | ICD-10-CM | POA: Diagnosis present

## 2018-10-18 DIAGNOSIS — Z833 Family history of diabetes mellitus: Secondary | ICD-10-CM | POA: Diagnosis not present

## 2018-10-18 DIAGNOSIS — E871 Hypo-osmolality and hyponatremia: Secondary | ICD-10-CM | POA: Diagnosis not present

## 2018-10-18 DIAGNOSIS — I9589 Other hypotension: Secondary | ICD-10-CM | POA: Diagnosis not present

## 2018-10-18 DIAGNOSIS — N2581 Secondary hyperparathyroidism of renal origin: Secondary | ICD-10-CM | POA: Diagnosis not present

## 2018-10-18 DIAGNOSIS — W1830XA Fall on same level, unspecified, initial encounter: Secondary | ICD-10-CM | POA: Diagnosis present

## 2018-10-18 DIAGNOSIS — E8770 Fluid overload, unspecified: Secondary | ICD-10-CM | POA: Diagnosis not present

## 2018-10-18 DIAGNOSIS — I252 Old myocardial infarction: Secondary | ICD-10-CM | POA: Diagnosis not present

## 2018-10-18 DIAGNOSIS — A09 Infectious gastroenteritis and colitis, unspecified: Secondary | ICD-10-CM | POA: Diagnosis not present

## 2018-10-18 LAB — COMPREHENSIVE METABOLIC PANEL
ALT: 11 U/L (ref 0–44)
AST: 22 U/L (ref 15–41)
Albumin: 2.4 g/dL — ABNORMAL LOW (ref 3.5–5.0)
Alkaline Phosphatase: 130 U/L — ABNORMAL HIGH (ref 38–126)
Anion gap: 10 (ref 5–15)
BUN: 17 mg/dL (ref 8–23)
CO2: 29 mmol/L (ref 22–32)
Calcium: 8.8 mg/dL — ABNORMAL LOW (ref 8.9–10.3)
Chloride: 94 mmol/L — ABNORMAL LOW (ref 98–111)
Creatinine, Ser: 4.63 mg/dL — ABNORMAL HIGH (ref 0.44–1.00)
GFR calc Af Amer: 10 mL/min — ABNORMAL LOW (ref >60)
GFR calc non Af Amer: 9 mL/min — ABNORMAL LOW (ref >60)
Glucose, Bld: 77 mg/dL (ref 70–99)
Potassium: 2.8 mmol/L — ABNORMAL LOW (ref 3.5–5.1)
Sodium: 133 mmol/L — ABNORMAL LOW (ref 135–145)
Total Bilirubin: 1.8 mg/dL — ABNORMAL HIGH (ref 0.3–1.2)
Total Protein: 6.7 g/dL (ref 6.5–8.1)

## 2018-10-18 LAB — CBC
HCT: 30.8 % — ABNORMAL LOW (ref 36.0–46.0)
HCT: 31.4 % — ABNORMAL LOW (ref 36.0–46.0)
Hemoglobin: 10.1 g/dL — ABNORMAL LOW (ref 12.0–15.0)
Hemoglobin: 10.7 g/dL — ABNORMAL LOW (ref 12.0–15.0)
MCH: 32.3 pg (ref 26.0–34.0)
MCH: 32.5 pg (ref 26.0–34.0)
MCHC: 32.8 g/dL (ref 30.0–36.0)
MCHC: 34.1 g/dL (ref 30.0–36.0)
MCV: 98.4 fL (ref 80.0–100.0)
MCV: 95.4 fL (ref 80.0–100.0)
Platelets: 127 10*3/uL — ABNORMAL LOW (ref 150–400)
Platelets: 127 10*3/uL — ABNORMAL LOW (ref 150–400)
RBC: 3.13 MIL/uL — ABNORMAL LOW (ref 3.87–5.11)
RBC: 3.29 MIL/uL — ABNORMAL LOW (ref 3.87–5.11)
RDW: 18.3 % — ABNORMAL HIGH (ref 11.5–15.5)
RDW: 18.3 % — ABNORMAL HIGH (ref 11.5–15.5)
WBC: 5.3 10*3/uL (ref 4.0–10.5)
WBC: 6 10*3/uL (ref 4.0–10.5)
nRBC: 0 % (ref 0.0–0.2)
nRBC: 0 % (ref 0.0–0.2)

## 2018-10-18 LAB — RENAL FUNCTION PANEL
Albumin: 2.4 g/dL — ABNORMAL LOW (ref 3.5–5.0)
Anion gap: 12 (ref 5–15)
BUN: 18 mg/dL (ref 8–23)
CO2: 25 mmol/L (ref 22–32)
Calcium: 8.7 mg/dL — ABNORMAL LOW (ref 8.9–10.3)
Chloride: 94 mmol/L — ABNORMAL LOW (ref 98–111)
Creatinine, Ser: 5.1 mg/dL — ABNORMAL HIGH (ref 0.44–1.00)
GFR calc Af Amer: 9 mL/min — ABNORMAL LOW (ref >60)
GFR calc non Af Amer: 8 mL/min — ABNORMAL LOW (ref >60)
Glucose, Bld: 79 mg/dL (ref 70–99)
Phosphorus: 4.1 mg/dL (ref 2.5–4.6)
Potassium: 3.7 mmol/L (ref 3.5–5.1)
Sodium: 131 mmol/L — ABNORMAL LOW (ref 135–145)

## 2018-10-18 LAB — GLUCOSE, CAPILLARY
Glucose-Capillary: 63 mg/dL — ABNORMAL LOW (ref 70–99)
Glucose-Capillary: 30 mg/dL — CL (ref 70–99)
Glucose-Capillary: 155 mg/dL — ABNORMAL HIGH (ref 70–99)
Glucose-Capillary: <10 mg/dL — CL (ref 70–99)
Glucose-Capillary: <10 mg/dL — CL (ref 70–99)

## 2018-10-18 LAB — SARS CORONAVIRUS 2 (TAT 6-24 HRS): SARS Coronavirus 2: NEGATIVE

## 2018-10-18 LAB — MAGNESIUM: Magnesium: 1.8 mg/dL (ref 1.7–2.4)

## 2018-10-18 LAB — ECHOCARDIOGRAM COMPLETE
Height: 71 in
Weight: 2608 oz

## 2018-10-18 LAB — GLUCOSE, RANDOM: Glucose, Bld: 156 mg/dL — ABNORMAL HIGH (ref 70–99)

## 2018-10-18 MED ORDER — DEXTROSE 50 % IV SOLN
INTRAVENOUS | Status: AC
  Administered 2018-10-18: 50 mL
  Filled 2018-10-18: qty 50

## 2018-10-18 MED ORDER — ATORVASTATIN CALCIUM 10 MG PO TABS
10.0000 mg | ORAL_TABLET | Freq: Every day | ORAL | Status: DC
  Administered 2018-10-18 – 2018-10-25 (×8): 10 mg via ORAL
  Filled 2018-10-18 (×8): qty 1

## 2018-10-18 MED ORDER — RENA-VITE PO TABS
1.0000 | ORAL_TABLET | Freq: Every day | ORAL | Status: DC
  Administered 2018-10-18 – 2018-10-25 (×8): 1 via ORAL
  Filled 2018-10-18 (×8): qty 1

## 2018-10-18 MED ORDER — GLUCOSE 4 G PO CHEW
CHEWABLE_TABLET | ORAL | Status: AC
  Administered 2018-10-18: 11:00:00 1
  Filled 2018-10-18: qty 1

## 2018-10-18 MED ORDER — SODIUM CHLORIDE 0.9 % IV SOLN: 100.0000 mL | INTRAVENOUS | Status: DC | PRN

## 2018-10-18 MED ORDER — CALCITRIOL 0.25 MCG PO CAPS
1.0000 ug | ORAL_CAPSULE | ORAL | Status: DC
  Administered 2018-10-21 – 2018-10-25 (×4): 1 ug via ORAL
  Filled 2018-10-18: qty 4

## 2018-10-18 MED ORDER — DEXTROSE 50 % IV SOLN
INTRAVENOUS | Status: AC
  Administered 2018-10-18: 11:00:00 50 mL
  Filled 2018-10-18: qty 50

## 2018-10-18 MED ORDER — PEG-KCL-NACL-NASULF-NA ASC-C 100 G PO SOLR
0.5000 | Freq: Once | ORAL | Status: AC
  Administered 2018-10-19: 100 g via ORAL
  Filled 2018-10-18: qty 1

## 2018-10-18 MED ORDER — LIDOCAINE HCL (PF) 1 % IJ SOLN: 5.0000 mL | INTRAMUSCULAR | Status: DC | PRN

## 2018-10-18 MED ORDER — PENTAFLUOROPROP-TETRAFLUOROETH EX AERO: 1.0000 "application " | INHALATION_SPRAY | CUTANEOUS | Status: DC | PRN

## 2018-10-18 MED ORDER — POTASSIUM CHLORIDE CRYS ER 20 MEQ PO TBCR: 20.0000 meq | EXTENDED_RELEASE_TABLET | Freq: Once | ORAL | Status: DC

## 2018-10-18 MED ORDER — METRONIDAZOLE IN NACL 5-0.79 MG/ML-% IV SOLN
500.0000 mg | Freq: Three times a day (TID) | INTRAVENOUS | Status: DC
  Administered 2018-10-18: 03:00:00 500 mg via INTRAVENOUS
  Filled 2018-10-18: qty 100

## 2018-10-18 MED ORDER — DARBEPOETIN ALFA 60 MCG/0.3ML IJ SOSY
60.0000 ug | PREFILLED_SYRINGE | INTRAMUSCULAR | Status: DC
  Administered 2018-10-25: 10:00:00 60 ug via INTRAVENOUS
  Filled 2018-10-18: qty 0.3

## 2018-10-18 MED ORDER — PEG-KCL-NACL-NASULF-NA ASC-C 100 G PO SOLR: 1.0000 | Freq: Once | ORAL | Status: DC

## 2018-10-18 MED ORDER — CINACALCET HCL 30 MG PO TABS
30.0000 mg | ORAL_TABLET | Freq: Every day | ORAL | Status: DC
  Administered 2018-10-19 – 2018-10-25 (×7): 30 mg via ORAL
  Filled 2018-10-18 (×10): qty 1

## 2018-10-18 MED ORDER — DEXTROSE 50 % IV SOLN
INTRAVENOUS | Status: AC
  Administered 2018-10-18: 23:00:00 50 mL
  Filled 2018-10-18: qty 50

## 2018-10-18 MED ORDER — NICOTINE 14 MG/24HR TD PT24: 14.0000 mg | MEDICATED_PATCH | Freq: Every day | TRANSDERMAL | Status: DC | PRN

## 2018-10-18 MED ORDER — ALLOPURINOL 100 MG PO TABS
100.0000 mg | ORAL_TABLET | Freq: Every day | ORAL | Status: DC
  Administered 2018-10-19 – 2018-10-26 (×7): 100 mg via ORAL
  Filled 2018-10-18 (×7): qty 1

## 2018-10-18 MED ORDER — PEG-KCL-NACL-NASULF-NA ASC-C 100 G PO SOLR
0.5000 | Freq: Once | ORAL | Status: AC
  Administered 2018-10-19: 100 g via ORAL

## 2018-10-18 MED ORDER — PEG-KCL-NACL-NASULF-NA ASC-C 100 G PO SOLR
0.5000 | Freq: Once | ORAL | Status: DC
  Filled 2018-10-18 (×2): qty 1

## 2018-10-18 MED ORDER — CHLORHEXIDINE GLUCONATE CLOTH 2 % EX PADS
6.0000 | MEDICATED_PAD | Freq: Every day | CUTANEOUS | Status: DC
  Administered 2018-10-18 – 2018-10-25 (×2): 6 via TOPICAL

## 2018-10-18 MED ORDER — ATENOLOL 100 MG PO TABS
100.0000 mg | ORAL_TABLET | Freq: Every day | ORAL | Status: DC
  Administered 2018-10-19: 10:00:00 100 mg via ORAL
  Filled 2018-10-18: qty 1

## 2018-10-18 MED ORDER — DEXTROSE 50 % IV SOLN: 1.0000 | Freq: Once | INTRAVENOUS | Status: AC

## 2018-10-18 MED ORDER — PIPERACILLIN-TAZOBACTAM 3.375 G IVPB
3.3750 g | Freq: Two times a day (BID) | INTRAVENOUS | Status: DC
  Administered 2018-10-18 – 2018-10-19 (×3): 3.375 g via INTRAVENOUS
  Filled 2018-10-18 (×6): qty 50

## 2018-10-18 MED ORDER — LIDOCAINE-PRILOCAINE 2.5-2.5 % EX CREA
1.0000 "application " | TOPICAL_CREAM | CUTANEOUS | Status: DC | PRN
  Filled 2018-10-18: qty 5

## 2018-10-18 MED ORDER — HYDROCERIN EX CREA
TOPICAL_CREAM | Freq: Two times a day (BID) | CUTANEOUS | Status: DC
  Administered 2018-10-18 – 2018-10-26 (×16): via TOPICAL
  Filled 2018-10-18: qty 113

## 2018-10-18 MED ORDER — BISACODYL 5 MG PO TBEC
10.0000 mg | DELAYED_RELEASE_TABLET | Freq: Once | ORAL | Status: AC
  Administered 2018-10-19: 12:00:00 10 mg via ORAL
  Filled 2018-10-18: qty 2

## 2018-10-18 MED ORDER — SUCROFERRIC OXYHYDROXIDE 500 MG PO CHEW
1000.0000 mg | CHEWABLE_TABLET | Freq: Three times a day (TID) | ORAL | Status: DC
  Administered 2018-10-19 – 2018-10-26 (×18): 1000 mg via ORAL
  Filled 2018-10-18 (×27): qty 2

## 2018-10-18 MED ORDER — BISACODYL 5 MG PO TBEC: 10.0000 mg | DELAYED_RELEASE_TABLET | Freq: Once | ORAL | Status: DC

## 2018-10-18 MED ORDER — CALCITRIOL 0.25 MCG PO CAPS
1.2500 ug | ORAL_CAPSULE | Freq: Every day | ORAL | Status: DC
  Administered 2018-10-18: 10:00:00 1.25 ug via ORAL
  Filled 2018-10-18: qty 5

## 2018-10-18 MED ORDER — PEG-KCL-NACL-NASULF-NA ASC-C 100 G PO SOLR: 0.5000 | Freq: Once | ORAL | Status: DC

## 2018-10-18 MED ORDER — VITAMIN B-1 100 MG PO TABS
100.0000 mg | ORAL_TABLET | Freq: Every day | ORAL | Status: DC
  Administered 2018-10-19 – 2018-10-26 (×7): 100 mg via ORAL
  Filled 2018-10-18 (×7): qty 1

## 2018-10-18 MED ORDER — POTASSIUM CHLORIDE CRYS ER 20 MEQ PO TBCR
40.0000 meq | EXTENDED_RELEASE_TABLET | Freq: Once | ORAL | Status: AC
  Administered 2018-10-18: 40 meq via ORAL
  Filled 2018-10-18: qty 2

## 2018-10-18 NOTE — Progress Notes (Signed)
Hypoglycemic Event  CBG:>10  Treatment: D50 50 mL (25 gm)  Symptoms: Hungry  Follow-up CBG: Time:2300 CBG Result>10 treated again and repeat cbg @2322  155  Possible Reasons for Event: Inadequate meal intake and patient was in dialysis  Comments/MD notified:[place], NP   [employer]

## 2018-10-18 NOTE — H&P (Addendum)
TRH H&P    Patient Demographics:    Jessica Clarke, is a 68 y.o. female  MRN: [id number]  DOB - [birth year]  Admit Date - 10/17/2018  Referring MD/NP/PA:   Hoover Browns  Outpatient Primary MD for the patient is Glendale Chard, MD Elizabeth - oncology  Patient coming from:  Home -> [employer]  Chief complaint-  Anemia, diarrhea, rectal bleeding   HPI:    Jessica Clarke  is a 68 y.o. female,  w lung cancer, hypertension, hyperlipidemia, CAD, CHF, ESRD on HD (MWF), Anemia , hx of diverticulosis/ adenomatous polyp,  on colonoscopy 05/24/2010  colitis 8/17presents with c/o diarrhea for the past few weeks as well as rectal bleeding.  Both intermittently.  Pt presented to ER and then went to try to get something to eat at [facility] and was found on floor.   In ED,  Temperature 98 pulse 70 respiratory rate 16 blood pressure 109/56 pulse ox 99% on room air Weight 73.9 kg  Prior CT abd/ pelvis 09/30/2018 IMPRESSION: 1. Findings suggestive of ascending colitis, may be infectious or inflammatory. 2. Colonic diverticulosis without focal diverticulitis. 3. Third-spacing/volume overload with generalized soft tissue edema throughout the abdomen and pelvis with small volume abdominopelvic ascites. Small right pleural effusion has slightly increased compared to to prior PET. 4. Advanced aortic atherosclerosis.  Coronary artery calcifications.   Sodium 135, potassium 2.8 BUN 16, creatinine 4.44 AST 21, ALT 10, alk phos 141, total bilirubin 1.1, albumin 2.5 WBC 4.5, hemoglobin 6.6, MCV 105.9, RDW 18.6, platelet count 145 FOBT negative  Pt was started on transfusion of prbc by ED  Pt will be admitted for acute symptomatic anemia, and diarrhea, and rectal bleeding.     Review of systems:    In addition to the HPI above,  No Fever-chills, No Headache, No changes with Vision or  hearing, No problems swallowing food or Liquids, No Chest pain, Cough or Shortness of Breath, No Abdominal pain, No Nausea or Vomiting,   No Blood in Urine, No dysuria, No new skin rashes or bruises, No new joints pains-aches,  No new weakness, tingling, numbness in any extremity, No recent weight gain or loss, No polyuria, polydypsia or polyphagia, No significant Mental Stressors.  All other systems reviewed and are negative.    Past History of the following :    Past Medical History:  Diagnosis Date   Abnormal Pap smear    ASCUS ?HGSIL   Anemia    Cervical dysplasia    CHF (congestive heart failure) (HCC)    Chronic kidney disease    Constipation    Constipation    GERD (gastroesophageal reflux disease)    Gout    Hyperlipidemia    Hypertension    Myocardial infarction ([place]) 2015   Obesity    Pneumonia    Uterine polyp       Past Surgical History:  Procedure Laterality Date   AV FISTULA PLACEMENT  01/18/2012   Procedure: ARTERIOVENOUS (AV) FISTULA CREATION;  Surgeon: Angelia Mould, MD;  Location: [facility] OR;  Service: Vascular;  Laterality: Left;   DILATION AND CURETTAGE OF UTERUS     HYSTEROSCOPY     LEEP     LEFT HEART CATHETERIZATION WITH CORONARY ANGIOGRAM N/A 08/26/2013   Procedure: LEFT HEART CATHETERIZATION WITH CORONARY ANGIOGRAM;  Surgeon: Leonie Man, MD;  Location: [facility] CATH LAB;  Service: Cardiovascular;  Laterality: N/A;   REVISON OF ARTERIOVENOUS FISTULA Left 5/70/1779   Procedure: PLICATION OF ARTERIOVENOUS FISTULA;  Surgeon: Conrad Havana, MD;  Location: [facility] OR;  Service: Vascular;  Laterality: Left;      Social History:      Social History   Tobacco Use   Smoking status: Current Every Day Smoker    Packs/day: 0.50    Years: 30.00    Pack years: 15.00    Types: Cigarettes   Smokeless tobacco: Never Used  Substance Use Topics   Alcohol use: Yes    Alcohol/week: 6.0 standard drinks    Types: 6 Cans of beer  per week       Family History :     Family History  Problem Relation Age of Onset   Esophageal cancer Other    Pancreatic cancer Other    Heart disease Mother    Hypertension Mother    Breast cancer Sister    Esophageal cancer Sister    Cancer Sister    Deep vein thrombosis Sister    Diabetes Sister    Hypertension Sister    Hypertension Daughter    Colon cancer Neg Hx        Home Medications:   Prior to Admission medications   Medication Sig Start Date End Date Taking? Authorizing Provider  acetaminophen (TYLENOL) 325 MG tablet Take 325-650 mg by mouth every 8 (eight) hours as needed for mild pain.    Yes [provider]  allopurinol (ZYLOPRIM) 100 MG tablet Take 100 mg by mouth daily.     Yes [provider]  aspirin EC 325 MG tablet Take 1 tablet (325 mg total) by mouth daily. 08/27/13  Yes Thurnell Lose, MD  atenolol (TENORMIN) 100 MG tablet Take 1 tablet (100 mg total) by mouth daily. 08/27/13  Yes Thurnell Lose, MD  atorvastatin (LIPITOR) 10 MG tablet Take 10 mg by mouth at bedtime.  01/30/16  Yes [provider]  colchicine 0.6 MG tablet Take 0.6 mg by mouth daily as needed (as directed for gout flares or pain).  08/14/18  Yes [provider]  VELPHORO 500 MG chewable tablet Chew 2 tablets by mouth 3 (three) times daily with meals.  06/30/16  Yes [provider]  amoxicillin-clavulanate (AUGMENTIN) 500-125 MG tablet Take 1 tablet (500 mg total) by mouth See admin instructions. Take one daily. On days of dialysis take an additional dose during and after dialysis. Patient not taking: Reported on 10/17/2018 09/30/18   Mesner, Corene Cornea, MD  thiamine (VITAMIN B-1) 100 MG tablet Take 100 mg by mouth daily.    [provider]     Allergies:    No Known Allergies   Physical Exam:   Vitals  Blood pressure 133/74, pulse 65, temperature (!) 97.4 F (36.3 C), temperature source Temporal, resp. rate 17, height  5' 11"  (1.803 m), weight 73.9 kg, SpO2 100 %.  1.  General: Alert and oriented x3  2. Psychiatric: Euthymic  3. Neurologic: Cranial nerves II through XII intact, reflexes 2+, symmetric, diffuse with downgoing toes bilaterally, motor 5/5 in all 4 extremities  4.  HEENMT:  Anicteric, pupils 1.5 mm, symmetric, direct, consensual reflexes intact, positive arcus senilis Neck no JVD  5. Respiratory : CTA B  6. Cardiovascular : Regular rate rhythm S1-S2 with a 2 out of 6 systolic ejection murmur at the right upper sternal border  7. Gastrointestinal:  Abdomen soft nontender nondistended positive bowel sounds  8. Skin:  Extremities no cyanosis clubbing or edema, dry skin  9.Musculoskeletal:  Good range of motion    Data Review:    CBC Recent Labs  Lab 10/17/18 1751  WBC 4.5  HGB 6.6*  HCT 21.6*  PLT 145*  MCV 105.9*  MCH 32.4  MCHC 30.6  RDW 18.6*   ------------------------------------------------------------------------------------------------------------------  Results for orders placed or performed during the hospital encounter of 10/17/18 (from the past 48 hour(s))  Comprehensive metabolic panel     Status: Abnormal   Collection Time: 10/17/18  5:51 PM  Result Value Ref Range   Sodium 135 135 - 145 mmol/L   Potassium 2.8 (L) 3.5 - 5.1 mmol/L   Chloride 94 (L) 98 - 111 mmol/L   CO2 28 22 - 32 mmol/L   Glucose, Bld 95 70 - 99 mg/dL   BUN 16 8 - 23 mg/dL   Creatinine, Ser 4.44 (H) 0.44 - 1.00 mg/dL   Calcium 8.7 (L) 8.9 - 10.3 mg/dL   Total Protein 7.4 6.5 - 8.1 g/dL   Albumin 2.5 (L) 3.5 - 5.0 g/dL   AST 21 15 - 41 U/L   ALT 10 0 - 44 U/L   Alkaline Phosphatase 141 (H) 38 - 126 U/L   Total Bilirubin 1.1 0.3 - 1.2 mg/dL   GFR calc non Af Amer 9 (L) >60 mL/min   GFR calc Af Amer 11 (L) >60 mL/min   Anion gap 13 5 - 15    Comment: Performed at [facility], [address] [place] [address]  CBC     Status: Abnormal   Collection Time:  10/17/18  5:51 PM  Result Value Ref Range   WBC 4.5 4.0 - 10.5 K/uL   RBC 2.04 (L) 3.87 - 5.11 MIL/uL   Hemoglobin 6.6 (LL) 12.0 - 15.0 g/dL    Comment: REPEATED TO VERIFY THIS CRITICAL RESULT HAS VERIFIED AND BEEN CALLED TO A.SAUNDERS RN BY KATHERINE MCCORMICK ON 09 03 2020 AT 1828, AND HAS BEEN READ BACK.     HCT 21.6 (L) 36.0 - 46.0 %   MCV 105.9 (H) 80.0 - 100.0 fL   MCH 32.4 26.0 - 34.0 pg   MCHC 30.6 30.0 - 36.0 g/dL   RDW 18.6 (H) 11.5 - 15.5 %   Platelets 145 (L) 150 - 400 K/uL    Comment: REPEATED TO VERIFY   nRBC 0.7 (H) 0.0 - 0.2 %    Comment: Performed at [facility] [address], [place], [place] [address]  Type and screen [place]     Status: None (Preliminary result)   Collection Time: 10/17/18  5:51 PM  Result Value Ref Range   ABO/RH(D) O POS    Antibody Screen NEG    Sample Expiration 10/20/2018,2359    Unit Number [id number]    Blood Component Type RED CELLS,LR    Unit division 00    Status of Unit ISSUED    Transfusion Status OK TO TRANSFUSE    Crossmatch Result Compatible    Unit Number [id number]    Blood Component Type RED CELLS,LR    Unit  division 00    Status of Unit ISSUED    Transfusion Status OK TO TRANSFUSE    Crossmatch Result      Compatible Performed at [facility], [place] [place], [place] [address]    Unit Number [id number]    Blood Component Type RED CELLS,LR    Unit division 00    Status of Unit ALLOCATED    Transfusion Status OK TO TRANSFUSE    Crossmatch Result Compatible   Prepare RBC     Status: None   Collection Time: 10/17/18  9:34 PM  Result Value Ref Range   Order Confirmation      ORDER PROCESSED BY BLOOD BANK Performed at [facility], [place] [address] [place], [place] [address]   POC occult blood, ED     Status: None   Collection Time: 10/17/18 10:41 PM  Result Value Ref Range   Fecal Occult Bld NEGATIVE NEGATIVE    Chemistries  Recent Labs  Lab  10/17/18 1751  NA 135  K 2.8*  CL 94*  CO2 28  GLUCOSE 95  BUN 16  CREATININE 4.44*  CALCIUM 8.7*  AST 21  ALT 10  ALKPHOS 141*  BILITOT 1.1   ------------------------------------------------------------------------------------------------------------------  ------------------------------------------------------------------------------------------------------------------ GFR: Estimated Creatinine Clearance: 13.4 mL/min (A) (by C-G formula based on SCr of 4.44 mg/dL (H)). Liver Function Tests: Recent Labs  Lab 10/17/18 1751  AST 21  ALT 10  ALKPHOS 141*  BILITOT 1.1  PROT 7.4  ALBUMIN 2.5*   No results for input(s): LIPASE, AMYLASE in the last 168 hours. No results for input(s): AMMONIA in the last 168 hours. Coagulation Profile: No results for input(s): INR, PROTIME in the last 168 hours. Cardiac Enzymes: No results for input(s): CKTOTAL, CKMB, CKMBINDEX, TROPONINI in the last 168 hours. BNP (last 3 results) No results for input(s): PROBNP in the last 8760 hours. HbA1C: No results for input(s): HGBA1C in the last 72 hours. CBG: No results for input(s): GLUCAP in the last 168 hours. Lipid Profile: No results for input(s): CHOL, HDL, LDLCALC, TRIG, CHOLHDL, LDLDIRECT in the last 72 hours. Thyroid Function Tests: No results for input(s): TSH, T4TOTAL, FREET4, T3FREE, THYROIDAB in the last 72 hours. Anemia Panel: No results for input(s): VITAMINB12, FOLATE, FERRITIN, TIBC, IRON, RETICCTPCT in the last 72 hours.  --------------------------------------------------------------------------------------------------------------- Urine analysis:    Component Value Date/Time   COLORURINE YELLOW 08/27/2013 0546   APPEARANCEUR CLOUDY (A) 08/27/2013 0546   LABSPEC 1.026 08/27/2013 0546   PHURINE 7.5 08/27/2013 0546   GLUCOSEU NEGATIVE 08/27/2013 0546   HGBUR TRACE (A) 08/27/2013 0546   BILIRUBINUR SMALL (A) 08/27/2013 0546   KETONESUR NEGATIVE 08/27/2013 0546   PROTEINUR  >300 (A) 08/27/2013 0546   UROBILINOGEN 0.2 08/27/2013 0546   NITRITE NEGATIVE 08/27/2013 0546   LEUKOCYTESUR NEGATIVE 08/27/2013 0546      Imaging Results:    No results found.     Assessment & Plan:    Principal Problem:   Symptomatic anemia Active Problems:   Hypertension   Smoker   ESRD on dialysis ([place])   Diarrhea   Rectal bleeding Symptomatic anemia ? Rectal bleeding from ddx  diverticulosis, colitis, malignancy (hx of adenoma in 2012) Transfuse 2units prbc Check cbc in am Please consult GI in am  Diarrhea Check GI pathogen panel Check C. Diff Start Flagyl 530m iv tid  H/o Colitis (tx with augmentin 8/17) Check CT scan abd/ pelvis  Smoker Pt counselled on smoking cessation x 3 minutes Nicotine  patch 14 mg topically qday prn  Hypertension, Hyperlipidemia, CAD, CHF Hold aspirin due to rectal bleeding Cont Atenolol 160m po qday Cont Lipitor 155mpo qhs  ESRD on HD (MWF) Please consult nephrology in AM  H/o Gout Cont Allopurinol 10029mo qday  Lung cancer , spiculated nodule in the RUL F/u with MohCurt Bearsbnormal LFT -Alkaline phosphatase elevated ? Metastatic disease to bone  ?  F/u with oncology as above  DVT Prophylaxis-   SCDs , no lovenox due to rectal bleeding  AM Labs Ordered, also please review Full Orders  Family Communication: Admission, patients condition and plan of care including tests being ordered have been discussed with the patient who indicate understanding and agree with the plan and Code Status.  Code Status:  FULL CODE per patient,  [facility] Observation: Based on patients clinical presentation and evaluation of above clinical data, I have made determination that patient meets Observation  criteria at this time.    Time spent in minutes : 70   JamJani GravelD on 10/18/2018 at 1:43 AM

## 2018-10-18 NOTE — ED Notes (Signed)
ED TO INPATIENT HANDOFF REPORT  ED Nurse Name and Phone #: Burman Nieves [phone]  S Name/Age/Gender Jessica Clarke 68 y.o. female Room/Bed: TRACC/TRACC  Code Status   Code Status: Full Code  Home/SNF/Other Home Patient oriented to: self, place, time and situation Is this baseline? Yes   Triage Complete: Triage complete  Chief Complaint Diarrhea  Triage Note Pt presents with ongoing black stool x2 weeks, recently seen here for the same, reports she has some blood in her stool now.    Allergies No Known Allergies  Level of Care/Admitting Diagnosis ED Disposition    ED Disposition Condition Comment   Admit  Hospital Area: [facility] [[id number]]  Level of Care: Telemetry Medical [104]  I expect the patient will be discharged within 24 hours: No (not a candidate for 5C-Observation unit)  Covid Evaluation: Asymptomatic Screening Protocol (No Symptoms)  Diagnosis: Symptomatic anemia [[id number]]  Admitting Physician: Jani Gravel [[id number]]  Attending Physician: Jani Gravel [[id number]]  PT Class (Do Not Modify): Observation [104]  PT Acc Code (Do Not Modify): Observation [10022]       B Medical/Surgery History Past Medical History:  Diagnosis Date  . Abnormal Pap smear    ASCUS ?HGSIL  . Anemia   . Cervical dysplasia   . CHF (congestive heart failure) ([place])   . Chronic kidney disease   . Constipation   . Constipation   . GERD (gastroesophageal reflux disease)   . Gout   . Hyperlipidemia   . Hypertension   . Myocardial infarction ([place]) 2015  . Obesity   . Pneumonia   . Uterine polyp    Past Surgical History:  Procedure Laterality Date  . AV FISTULA PLACEMENT  01/18/2012   Procedure: ARTERIOVENOUS (AV) FISTULA CREATION;  Surgeon: Angelia Mould, MD;  Location: [facility];  Service: Vascular;  Laterality: Left;  . DILATION AND CURETTAGE OF UTERUS    . HYSTEROSCOPY    . LEEP    . LEFT HEART CATHETERIZATION WITH CORONARY ANGIOGRAM N/A 08/26/2013   Procedure:  LEFT HEART CATHETERIZATION WITH CORONARY ANGIOGRAM;  Surgeon: Leonie Man, MD;  Location: [facility] CATH LAB;  Service: Cardiovascular;  Laterality: N/A;  . REVISON OF ARTERIOVENOUS FISTULA Left 9/98/3382   Procedure: PLICATION OF ARTERIOVENOUS FISTULA;  Surgeon: Conrad [place], MD;  Location: [facility];  Service: Vascular;  Laterality: Left;     A IV Location/Drains/Wounds Patient Lines/Drains/Airways Status   Active Line/Drains/Airways    Name:   Placement date:   Placement time:   Site:   Days:   Peripheral IV 10/17/18 Right Antecubital   10/17/18    2128    Antecubital   1   Peripheral IV 10/18/18 Right Wrist   10/18/18    0353    Wrist   less than 1   Fistula / Graft Left Upper arm Arteriovenous fistula   -    -    Upper arm      Incision (Closed) 03/14/16 Arm Left   03/14/16    1434     948          Intake/Output Last 24 hours  Intake/Output Summary (Last 24 hours) at 10/18/2018 5053 Last data filed at 10/18/2018 0602 Gross per 24 hour  Intake 2165 ml  Output -  Net 2165 ml    Labs/Imaging Results for orders placed or performed during the hospital encounter of 10/17/18 (from the past 48 hour(s))  Comprehensive metabolic panel     Status: Abnormal   Collection  Time: 10/17/18  5:51 PM  Result Value Ref Range   Sodium 135 135 - 145 mmol/L   Potassium 2.8 (L) 3.5 - 5.1 mmol/L   Chloride 94 (L) 98 - 111 mmol/L   CO2 28 22 - 32 mmol/L   Glucose, Bld 95 70 - 99 mg/dL   BUN 16 8 - 23 mg/dL   Creatinine, Ser 4.44 (H) 0.44 - 1.00 mg/dL   Calcium 8.7 (L) 8.9 - 10.3 mg/dL   Total Protein 7.4 6.5 - 8.1 g/dL   Albumin 2.5 (L) 3.5 - 5.0 g/dL   AST 21 15 - 41 U/L   ALT 10 0 - 44 U/L   Alkaline Phosphatase 141 (H) 38 - 126 U/L   Total Bilirubin 1.1 0.3 - 1.2 mg/dL   GFR calc non Af Amer 9 (L) >60 mL/min   GFR calc Af Amer 11 (L) >60 mL/min   Anion gap 13 5 - 15    Comment: Performed at [facility], [place] [address] [place] [address]  CBC     Status: Abnormal   Collection  Time: 10/17/18  5:51 PM  Result Value Ref Range   WBC 4.5 4.0 - 10.5 K/uL   RBC 2.04 (L) 3.87 - 5.11 MIL/uL   Hemoglobin 6.6 (LL) 12.0 - 15.0 g/dL    Comment: REPEATED TO VERIFY THIS CRITICAL RESULT HAS VERIFIED AND BEEN CALLED TO A.SAUNDERS RN BY KATHERINE MCCORMICK ON 09 03 2020 AT 1828, AND HAS BEEN READ BACK.     HCT 21.6 (L) 36.0 - 46.0 %   MCV 105.9 (H) 80.0 - 100.0 fL   MCH 32.4 26.0 - 34.0 pg   MCHC 30.6 30.0 - 36.0 g/dL   RDW 18.6 (H) 11.5 - 15.5 %   Platelets 145 (L) 150 - 400 K/uL    Comment: REPEATED TO VERIFY   nRBC 0.7 (H) 0.0 - 0.2 %    Comment: Performed at [facility] [address] [place], [place] [address]  Type and screen [place]     Status: None (Preliminary result)   Collection Time: 10/17/18  5:51 PM  Result Value Ref Range   ABO/RH(D) O POS    Antibody Screen NEG    Sample Expiration 10/20/2018,2359    Unit Number [id number]    Blood Component Type RED CELLS,LR    Unit division 00    Status of Unit ISSUED    Transfusion Status OK TO TRANSFUSE    Crossmatch Result Compatible    Unit Number [id number]    Blood Component Type RED CELLS,LR    Unit division 00    Status of Unit ISSUED    Transfusion Status OK TO TRANSFUSE    Crossmatch Result Compatible    Unit Number [id number]    Blood Component Type RED CELLS,LR    Unit division 00    Status of Unit ISSUED    Transfusion Status OK TO TRANSFUSE    Crossmatch Result      Compatible Performed at [facility], [place] [address], [place], [place] [address]   Prepare RBC     Status: None   Collection Time: 10/17/18  9:34 PM  Result Value Ref Range   Order Confirmation      ORDER PROCESSED BY BLOOD BANK Performed at [facility], [place] [address] [place], [place] [address]   POC occult blood, ED     Status: None   Collection Time: 10/17/18  10:41 PM  Result Value Ref Range   Fecal Occult Bld NEGATIVE NEGATIVE  SARS CORONAVIRUS 2 (TAT 6-24 HRS)  Nasopharyngeal Nasopharyngeal Swab     Status: None   Collection Time: 10/18/18 12:05 AM   Specimen: Nasopharyngeal Swab  Result Value Ref Range   SARS Coronavirus 2 NEGATIVE NEGATIVE    Comment: (NOTE) SARS-CoV-2 target nucleic acids are NOT DETECTED. The SARS-CoV-2 RNA is generally detectable in upper and lower respiratory specimens during the acute phase of infection. Negative results do not preclude SARS-CoV-2 infection, do not rule out co-infections with other pathogens, and should not be used as the sole basis for treatment or other patient management decisions. Negative results must be combined with clinical observations, patient history, and epidemiological information. The expected result is Negative. Fact Sheet for Patients: SugarRoll.be Fact Sheet for Healthcare Providers: https://www.woods-mathews.com/ This test is not yet approved or cleared by the [place] FDA and  has been authorized for detection and/or diagnosis of SARS-CoV-2 by FDA under an Emergency Use Authorization (EUA). This EUA will remain  in effect (meaning this test can be used) for the duration of the COVID-19 declaration under Section 56 4(b)(1) of the Act, 21 U.S.C. section 360bbb-3(b)(1), unless the authorization is terminated or revoked sooner. Performed at [facility], [place] [address], [place], [place] [address]    Ct Abdomen Pelvis Wo Contrast  Result Date: 10/18/2018 CLINICAL DATA:  Follow-up colitis EXAM: CT ABDOMEN AND PELVIS WITHOUT CONTRAST TECHNIQUE: Multidetector CT imaging of the abdomen and pelvis was performed following the standard protocol without IV contrast. COMPARISON:  09/30/2018 FINDINGS: Lower chest: Small right-sided pleural effusion is noted which has increased slightly in the interval from the prior exam. No focal confluent infiltrate is seen. Cardiomegaly is noted. Hepatobiliary: No focal liver abnormality is seen. No gallstones,  gallbladder wall thickening, or biliary dilatation. Pancreas: Unremarkable. No pancreatic ductal dilatation or surrounding inflammatory changes. Spleen: Normal in size without focal abnormality. Adrenals/Urinary Tract: Adrenal glands are stable in appearance. The native kidneys are atrophic. The bladder is decompressed. No obstructive changes are seen. Stomach/Bowel: Diverticulosis is noted without evidence of definitive diverticulitis. No obstructive changes are seen. Persistent wall thickening in the ascending colon is noted again suspicious for colitis. The appendix is within normal limits. No small bowel abnormality is seen. The stomach is unremarkable. Vascular/Lymphatic: Aortic atherosclerosis. No enlarged abdominal or pelvic lymph nodes. Reproductive: Uterus and bilateral adnexa are unremarkable. Other: Mild ascites is noted as well as changes of anasarca these changes are similar to that seen on the prior exam and may contribute somewhat to the colonic edema. Musculoskeletal: Degenerative changes of the lumbar spine are noted. IMPRESSION: Stable edematous changes in the ascending colon consistent with colitis although the mild ascites and third spacing of fluid may contribute to this abnormality. Changes of anasarca and mild ascites. Slight increase in right-sided pleural effusion when compare with the prior exam. Electronically Signed   By: Inez Catalina M.D.   On: 10/18/2018 06:33    Pending Labs Unresulted Labs (From admission, onward)    Start     Ordered   10/18/18 0500  Comprehensive metabolic panel  Tomorrow morning,   R     10/17/18 2303   10/18/18 0500  CBC  Tomorrow morning,   R     10/17/18 2303   10/17/18 2302  HIV antibody (Routine Testing)  Once,   STAT     10/17/18 2303   10/17/18 2255  Gastrointestinal Panel by PCR ,  Stool  (Gastrointestinal Panel by PCR, Stool                                                                                                                                                      *Does Not include CLOSTRIDIUM DIFFICILE testing.**If CDIFF testing is needed, select the C Difficile Quick Screen w PCR reflex order below)  Once,   STAT     10/17/18 2254   10/17/18 2255  C Difficile Quick Screen w PCR reflex  (Gastrointestinal Panel by PCR, Stool                                                                                                                                                     *Does Not include CLOSTRIDIUM DIFFICILE testing.**If CDIFF testing is needed, select the C Difficile Quick Screen w PCR reflex order below)  Once, for 24 hours,   STAT     10/17/18 2254          Vitals/Pain Today's Vitals   10/18/18 0630 10/18/18 0645 10/18/18 0700 10/18/18 0745  BP: 131/78 121/73 131/72 130/70  Pulse: 63 61 63 62  Resp: (!) 23 (!) 24 (!) 21 20  Temp:      TempSrc:      SpO2: 95% 94% 92% 94%  Weight:      Height:      PainSc:        Isolation Precautions No active isolations  Medications Medications  acetaminophen (TYLENOL) tablet 650 mg (has no administration in time range)    Or  acetaminophen (TYLENOL) suppository 650 mg (has no administration in time range)  sodium chloride flush (NS) 0.9 % injection 3 mL (3 mLs Intravenous Given 10/17/18 2317)  sodium chloride flush (NS) 0.9 % injection 3 mL (has no administration in time range)  0.9 %  sodium chloride infusion (has no administration in time range)  feeding supplement (PRO-STAT SUGAR FREE 64) liquid 30 mL (has no administration in time range)  metroNIDAZOLE (FLAGYL) IVPB 500 mg (0 mg Intravenous Stopped 10/18/18 0602)  nicotine (NICODERM CQ - dosed in mg/24 hours) patch 14 mg (has no administration in time range)  0.9 %  sodium chloride infusion (0 mL/hr Intravenous Stopped 10/18/18 0602)    Mobility walks with person assist High fall risk   Focused Assessments Renal Assessment Handoff:  Hemodialysis Schedule: Hemodialysis Schedule: Monday/Wednesday/Friday Restricted  appendage: left arm     R Recommendations: See Admitting Provider Note  Report given to:   Additional Notes:  Low hgb, 3 units given

## 2018-10-18 NOTE — Progress Notes (Addendum)
CBG 31, repeat 30 mg/dl, pt A+Ox4, VSS, NAD noted, stat glucose ordered. Dr. Horris Latino notified, no new orders received at this time.

## 2018-10-18 NOTE — ED Notes (Signed)
Pt.is getting blood products

## 2018-10-18 NOTE — Progress Notes (Signed)
   10/18/18 2248  Provider Notification  Provider Name/Title Quincy Simmonds, NP  Date Provider Notified 10/18/18  Time Provider Notified 2248  Notification Type Page  Notification Reason Other (Comment) (critically low cbg)  Response See new orders  Date of Provider Response 10/18/18  Time of Provider Response 2304

## 2018-10-18 NOTE — Progress Notes (Signed)
Repeat CBG 156 mg/dl.

## 2018-10-18 NOTE — Progress Notes (Signed)
  Echocardiogram 2D Echocardiogram has been performed.  Jessica Clarke 10/18/2018, 2:58 PM

## 2018-10-18 NOTE — ED Notes (Addendum)
Pt found by [facility] EMT at [facility] on the floor. Pt had been placed in a room to be seen but was unable to be located in the lobby. Pt reported she was going to [facility], stumbled and fell, hitting her head on the floor, -LOC, hematoma and small lac noted above R eyebrow. Bleeding controlled on arrival to [facility]. Pt also reports frequent falls at home

## 2018-10-18 NOTE — ED Notes (Signed)
Tele breakfast ordered

## 2018-10-18 NOTE — Progress Notes (Signed)
Pharmacy Antibiotic Note  Tejasvi Brissett is a 68 y.o. female admitted on 10/17/2018 with anemia, diarrhea and rectal bleeding.  Pharmacy has been consulted for Zosyn dosing for intra-abdominal infection/colitis.  Patient has ESRD on TTS HD.  Afebrile, WBC WNL.  Plan: Zosyn EID 3.375gm IV Q12H Pharmacy will sign off as no dosage adjustment needed in ESRD  Height: 5\' 11"  (180.3 cm) Weight: 163 lb (73.9 kg) IBW/kg (Calculated) : 70.8  Temp (24hrs), Avg:97.5 F (36.4 C), Min:97 F (36.1 C), Max:98.1 F (36.7 C)  Recent Labs  Lab 10/17/18 1751 10/18/18 0500  WBC 4.5 5.3  CREATININE 4.44* 4.63*    Estimated Creatinine Clearance: 12.8 mL/min (A) (by C-G formula based on SCr of 4.63 mg/dL (H)).    No Known Allergies  Zosyn 9/4 >> Flagyl x1 9/4  9/4 covid - negative C.diff -  GI panel PCR -  Lemar Bakos D. Mina Marble, PharmD, BCPS, [place] 10/18/2018, 10:18 AM

## 2018-10-18 NOTE — Consult Note (Addendum)
[facility] Consult: 10:53 AM 10/18/2018  LOS: 0 days    Referring Provider: Dr Horris Latino  Primary Care Physician:  Glendale Chard, MD Primary Gastroenterologist:  Dr. Erskine Emery   Reason for Consultation: Hematochezia.  Anemia.   HPI: Jessica Clarke is a 68 y.o. female.  Hx Scleroderma.  Raynaud's phenomenon.  CHF.   CAD.  ?Bronchogenic cancer RUL 12/2017, no suspicious activity per 02/2018 PET scan. Interstitial lung dz. 4 cm ascending AA per CT 12/2017.  ESRD, HD on MWF.  Gout.  Hyponatremia, intermittent dating back to 2010.   05/2010 colonoscopy for change in bowel habits.  4 mm, sessile rectal polyp (TA, no HGD).  Scattered diverticulosis.  Lipoma in a sending colon. Patient has not responded to colonoscopy recall letter sent 04/2015.    Seen in ED 09/29/2018 for a couple of episodes of painless, rectal bleeding, dark red stools w some clots.   Physical exam unremarkable.   09/30/2018 CTAP w/o CM: Ascending colitis, infectious versus inflammatory.  Colon diverticulosis.  Volume overload/third spacing with generalized soft tissue edema in the abdomen pelvis and small volume abdominopelvic ascites.  Slight increase in small, right pleural effusion.  Advanced aortic atherosclerosis.  Coronary artery calcifications. Discharged on Augmentin.  Patient has had watery, dark/black, not grossly bloody stools since the ED visit.  Brief lower abdominal pain around the time she has a bowel movement.  Some nausea but no vomiting.  No chills, no fevers.  Appetite variable but generally poor.  Increasing weakness, most recently some dizziness.  Patient had syncopal spell yesterday and brought to the ED. Initial BP 109/56, heart rate 70.  Blood pressure has improved to 130s over 70s.  Heart rate remains in the 60s.  No  fevers Hgb 6.6 >> 3 PRBCs >> 10.1. Was 10.9 on 09/29/2018.  MCV 105, was 101 on 09/29/2018 WBCs normal.  No fevers.   Stool FOBT negative Hyponatremic at 127.  Hypokalemic at 2.8. Covid 19 negative.   CTAP w/o CM: .  Stable ascending colon edema consistent with colitis though mild ascites and third spacing of fluid may contribute to the abnormality.  Anasarca, mild ascites.  Slightly increased right pleural effusion  Placed on empiric Zosyn.    Social hx Divorced,  1 dtr.  Is alone.  Prior teacher, saleswoman.  Smoker.  No illicit drugs or ETOH Fm hx esoph cancer in sister    Past Medical History:  Diagnosis Date   Abnormal Pap smear    ASCUS ?HGSIL   Anemia    Cervical dysplasia    CHF (congestive heart failure) (HCC)    Chronic kidney disease    Constipation    Constipation    GERD (gastroesophageal reflux disease)    Gout    Hyperlipidemia    Hypertension    Myocardial infarction ([place]) 2015   Obesity    Pneumonia    Uterine polyp     Past Surgical History:  Procedure Laterality Date   AV FISTULA PLACEMENT  01/18/2012   Procedure: ARTERIOVENOUS (AV) FISTULA CREATION;  Surgeon: Judeth Cornfield  Scot Dock, MD;  Location: [facility];  Service: Vascular;  Laterality: Left;   DILATION AND CURETTAGE OF UTERUS     HYSTEROSCOPY     LEEP     LEFT HEART CATHETERIZATION WITH CORONARY ANGIOGRAM N/A 08/26/2013   Procedure: LEFT HEART CATHETERIZATION WITH CORONARY ANGIOGRAM;  Surgeon: Leonie Man, MD;  Location: [facility] CATH LAB;  Service: Cardiovascular;  Laterality: N/A;   REVISON OF ARTERIOVENOUS FISTULA Left 5/88/5027   Procedure: PLICATION OF ARTERIOVENOUS FISTULA;  Surgeon: Conrad Fullerton, MD;  Location: [facility];  Service: Vascular;  Laterality: Left;    Prior to Admission medications   Medication Sig Start Date End Date Taking? Authorizing Provider  acetaminophen (TYLENOL) 325 MG tablet Take 325-650 mg by mouth every 8 (eight) hours as needed for mild pain.    Yes  [provider]  allopurinol (ZYLOPRIM) 100 MG tablet Take 100 mg by mouth daily.     Yes [provider]  aspirin EC 325 MG tablet Take 1 tablet (325 mg total) by mouth daily. 08/27/13  Yes Thurnell Lose, MD  atenolol (TENORMIN) 100 MG tablet Take 1 tablet (100 mg total) by mouth daily. 08/27/13  Yes Thurnell Lose, MD  atorvastatin (LIPITOR) 10 MG tablet Take 10 mg by mouth at bedtime.  01/30/16  Yes [provider]  colchicine 0.6 MG tablet Take 0.6 mg by mouth daily as needed (as directed for gout flares or pain).  08/14/18  Yes [provider]  VELPHORO 500 MG chewable tablet Chew 2 tablets by mouth 3 (three) times daily with meals.  06/30/16  Yes [provider]  amoxicillin-clavulanate (AUGMENTIN) 500-125 MG tablet Take 1 tablet (500 mg total) by mouth See admin instructions. Take one daily. On days of dialysis take an additional dose during and after dialysis. Patient not taking: Reported on 10/17/2018 09/30/18   Mesner, Corene Cornea, MD  thiamine (VITAMIN B-1) 100 MG tablet Take 100 mg by mouth daily.    [provider]    Scheduled Meds:  glucose       calcitRIOL  1.25 mcg Oral Daily   Chlorhexidine Gluconate Cloth  6 each Topical Q0600   feeding supplement (PRO-STAT SUGAR FREE 64)  30 mL Oral BID   sodium chloride flush  3 mL Intravenous Q12H   Infusions:  sodium chloride     piperacillin-tazobactam (ZOSYN)  IV     PRN Meds: sodium chloride, acetaminophen **OR** acetaminophen, nicotine, sodium chloride flush   Allergies as of 10/17/2018   (No Known Allergies)    Family History  Problem Relation Age of Onset   Esophageal cancer Other    Pancreatic cancer Other    Heart disease Mother    Hypertension Mother    Breast cancer Sister    Esophageal cancer Sister    Cancer Sister    Deep vein thrombosis Sister    Diabetes Sister    Hypertension Sister    Hypertension Daughter    Colon cancer Neg Hx      Social History   Socioeconomic History   Marital status: Married    Spouse name: Not on file   Number of children: Not on file   Years of education: Not on file   Highest education level: Not on file  Occupational History    Employer: UNEMPLOYED    Comment: Unemployed   Social Needs   Financial resource strain: Not on file   Food insecurity    Worry: Not on file  Inability: Not on file   Transportation needs    Medical: Not on file    Non-medical: Not on file  Tobacco Use   Smoking status: Current Every Day Smoker    Packs/day: 0.50    Years: 30.00    Pack years: 15.00    Types: Cigarettes   Smokeless tobacco: Never Used  Substance and Sexual Activity   Alcohol use: Yes    Alcohol/week: 6.0 standard drinks    Types: 6 Cans of beer per week   Drug use: No   Sexual activity: Never    Birth control/protection: Post-menopausal    Comment: Did not ask  Lifestyle   Physical activity    Days per week: Not on file    Minutes per session: Not on file   Stress: Not on file  Relationships   Social connections    Talks on phone: Not on file    Gets together: Not on file    Attends religious service: Not on file    Active member of club or organization: Not on file    Attends meetings of clubs or organizations: Not on file    Relationship status: Not on file   Intimate partner violence    Fear of current or ex partner: Not on file    Emotionally abused: Not on file    Physically abused: Not on file    Forced sexual activity: Not on file  Other Topics Concern   Not on file  Social History Narrative   Not on file    REVIEW OF SYSTEMS: Constitutional: See HPI.  Progressive weakness.  Syncopal spell yesterday. ENT:  No nose bleeds Pulm: No resting shortness of breath.  No cough. CV:  No palpitations, no LE edema.  No chest pain.  Has not noticed tachycardia. GU:  No hematuria, no frequency GI: See HPI.  Denies dysphagia. Heme: No unusual  bleeding or bruising.   Transfusions: 3 units PRBCs overnight.  Patient does not recall prior blood product transfusions. Neuro: Syncope yesterday as per HPI.  No headaches, no peripheral tingling or numbness Derm: Chronic pruritus and dry, flaking skin. Endocrine:  No sweats or chills.  No polyuria or dysuria Immunization: Not queried.   PHYSICAL EXAM: Vital signs in last 24 hours: Vitals:   10/18/18 0800 10/18/18 1042  BP: 134/79   Pulse: 63   Resp: (!) 23   Temp:  97.8 F (36.6 C)  SpO2: 93%    Wt Readings from Last 3 Encounters:  10/17/18 73.9 kg  03/05/18 74.3 kg  02/04/18 74.6 kg    General: Frail, chronically ill and somewhat acutely ill-appearing AAF.   Head: No facial asymmetry or swelling.  No signs of head trauma. Eyes: No conjunctival pallor.  EOMI.  Arcus senilis.  No scleral icterus. Ears: Moderately HOH. Nose: No congestion or discharge. Mouth: Dry oral mucosa.  Remnants of what look like a slowly dissolved white pill sitting on the tongue.  Tongue midline.  About 8-10 lower front teeth remain otherwise edentulous. Neck: No JVD, no masses, no thyromegaly. Lungs: Rales in the base of the right lung.  No rhonchi.  No labored breathing.  No cough. Heart: RRR with soft murmur.  S1, S2 present. Abdomen: Slightly tense, nontender, active bowel sounds.  No fluid wave.  No organomegaly, hernias, bruits..   Rectal: Deferred Musc/Skeltl: No joint redness, swelling or gross deformity. Extremities: Nonpitting edema in the legs, feet.  Feet are warm.  Thrill palpable at AVG in left  upper arm. Neurologic: Able to sit up in bed with minimal assistance.  Slow verbal response time.  Laconic, answers in just 1 or 2 words.  Moves all 4 limbs, strength not tested.  No tremor, no asterixis. Skin: Dry, scaling.  Vitiligo in patches at the upper back, arms, sternal region. Nodes: No cervical adenopathy. Psych: Flat affect.  Cooperative.  Intake/Output from previous day: 09/03 0701  - 09/04 0700 In: 2165 [I.V.:395; Blood:1670; IV Piggyback:100] Out: -  Intake/Output this shift: No intake/output data recorded.  LAB RESULTS: Recent Labs    10/17/18 1751 10/18/18 0500  WBC 4.5 5.3  HGB 6.6* 10.1*  HCT 21.6* 30.8*  PLT 145* 127*   BMET Lab Results  Component Value Date   NA 133 (L) 10/18/2018   NA 135 10/17/2018   NA 131 (L) 09/29/2018   K 2.8 (L) 10/18/2018   K 2.8 (L) 10/17/2018   K 3.4 (L) 09/29/2018   CL 94 (L) 10/18/2018   CL 94 (L) 10/17/2018   CL 92 (L) 09/29/2018   CO2 29 10/18/2018   CO2 28 10/17/2018   CO2 26 09/29/2018   GLUCOSE 77 10/18/2018   GLUCOSE 95 10/17/2018   GLUCOSE 105 (H) 09/29/2018   BUN 17 10/18/2018   BUN 16 10/17/2018   BUN 21 09/29/2018   CREATININE 4.63 (H) 10/18/2018   CREATININE 4.44 (H) 10/17/2018   CREATININE 5.30 (H) 09/29/2018   CALCIUM 8.8 (L) 10/18/2018   CALCIUM 8.7 (L) 10/17/2018   CALCIUM 9.1 09/29/2018   LFT Recent Labs    10/17/18 1751 10/18/18 0500  PROT 7.4 6.7  ALBUMIN 2.5* 2.4*  AST 21 22  ALT 10 11  ALKPHOS 141* 130*  BILITOT 1.1 1.8*   PT/INR No results found for: INR, PROTIME Hepatitis Panel No results for input(s): HEPBSAG, HCVAB, HEPAIGM, HEPBIGM in the last 72 hours. C-Diff No components found for: CDIFF Lipase  No results found for: LIPASE  Drugs of Abuse  No results found for: LABOPIA, COCAINSCRNUR, LABBENZ, AMPHETMU, THCU, LABBARB   RADIOLOGY STUDIES: Ct Abdomen Pelvis Wo Contrast  Result Date: 10/18/2018 CLINICAL DATA:  Follow-up colitis EXAM: CT ABDOMEN AND PELVIS WITHOUT CONTRAST TECHNIQUE: Multidetector CT imaging of the abdomen and pelvis was performed following the standard protocol without IV contrast. COMPARISON:  09/30/2018 FINDINGS: Lower chest: Small right-sided pleural effusion is noted which has increased slightly in the interval from the prior exam. No focal confluent infiltrate is seen. Cardiomegaly is noted. Hepatobiliary: No focal liver abnormality is seen.  No gallstones, gallbladder wall thickening, or biliary dilatation. Pancreas: Unremarkable. No pancreatic ductal dilatation or surrounding inflammatory changes. Spleen: Normal in size without focal abnormality. Adrenals/Urinary Tract: Adrenal glands are stable in appearance. The native kidneys are atrophic. The bladder is decompressed. No obstructive changes are seen. Stomach/Bowel: Diverticulosis is noted without evidence of definitive diverticulitis. No obstructive changes are seen. Persistent wall thickening in the ascending colon is noted again suspicious for colitis. The appendix is within normal limits. No small bowel abnormality is seen. The stomach is unremarkable. Vascular/Lymphatic: Aortic atherosclerosis. No enlarged abdominal or pelvic lymph nodes. Reproductive: Uterus and bilateral adnexa are unremarkable. Other: Mild ascites is noted as well as changes of anasarca these changes are similar to that seen on the prior exam and may contribute somewhat to the colonic edema. Musculoskeletal: Degenerative changes of the lumbar spine are noted. IMPRESSION: Stable edematous changes in the ascending colon consistent with colitis although the mild ascites and third spacing of fluid may contribute to  this abnormality. Changes of anasarca and mild ascites. Slight increase in right-sided pleural effusion when compare with the prior exam. Electronically Signed   By: Inez Catalina M.D.   On: 10/18/2018 06:33     IMPRESSION:   *  Painless diarrhea.  FOBT negative, chronic black stools.  Right sided colitis on CT now and 3 weeks ago.   Treated with a course of Augmentin starting 8/27.   Stools are chronically dark due to meds but have changed to a watery consistency within the last 3 weeks.  FOBT negative per assay last night. Not convinced this is infectious, stool for C. difficile and gastrointestinal PCR panel have been ordered but not collected.  *    Small, adenomatous polyp on colonoscopy in 2012.  Did  not respond to colonoscopy call back letter sent 2017 thus 2 years overdue for polyp surveillance.  *   Blood loss anemia.  Improved after 3 PRBCs Baseline anemia of CKD.   Not sure if she recieves CSA at HD  *    Bronchogenic lung CA affected.   Interstitial lung dz.    No oncology fup since 03/05/18 when Dr Julien Nordmann wrote: "PET scan showed hypermetabolic right upper lobe pulmonary nodule highly suspicious for bronchogenic carcinoma.  There is also nonspecific hypermetabolic nodes within the mediastinum but these are indeterminate.  And there was no evidence for extrathoracic metastasis. I recommended for the patient to have pulmonary function test.  I will refer the patient to cardiothoracic surgery for surgical evaluation first.  If she is not a good surgical candidate, we will arrange for the patient to have a biopsy followed by stereotactic body radiotherapy." Appt with vascular surgery 03/14/18 canceled and has not been rescheduled.  *    ESRD. Hemodialysis on MWF  *   Hypokalemia.   Mild hyponatremia.    *   Sclerdoderma.     PLAN:     *    Colonoscopy?   Suggested this may be necessary and patient is agreeable.  Also discussed with her the fact that she may require a temporary NG tube to instill bowel prep as she looks as thouhg she may have difficulty completing bowel prep.  She is ok with NG tube for bowel prep if necessary. Dr. Tarri Glenn will be seeing the patient and making decision OK for diet now .  *   Does she needs some sort of lung imaging in order to follow-up on the suspicion of bronchogenic RUL cancer.  Also might be helpful to have thoracic surgeon see her.     Azucena Freed  10/18/2018, 10:53 AM Phone [phone]

## 2018-10-18 NOTE — Plan of Care (Signed)
Patient stable, discussed POC with patient, daughter, and sister, agreeable with plan, denies question/concerns at this time.

## 2018-10-18 NOTE — Progress Notes (Signed)
PROGRESS NOTE  Jessica Clarke DEY:[id number] DOB: December 27, 1950 DOA: 10/17/2018 PCP: Glendale Chard, MD  HPI/Recap of past 24 hours: HPI from Dr Jessica Clarke  is a 68 y.o. female,  w lung cancer, hypertension, hyperlipidemia, CAD, CHF, ESRD on HD (MWF), Anemia , hx of diverticulosis/ adenomatous polyp in colonoscopy on 05/24/2010. On 8/17, presents to the ED with c/o diarrhea for the past few weeks as well as rectal bleeding, both intermittent, found to have colitis on CT, discharged on p.o. Augmentin.  Pt presented to ER for similar complaints, noted black stools, with also some frank blood in his stools, with diarrhea.  Patient left the lobby, went to try to get something to eat at [facility] and was found on floor.  Reported LOC, hitting her head, sustained a laceration above right eyebrow.  In the ED, patient noted to have a hemoglobin of 6.6, hypokalemia 2.8.  Patient was transfused.  Hospitalist called to admit patient.    Today, patient denies any pain, or any new complaints.  Patient denies any chest pain, shortness of breath, abdominal pain, nausea/vomiting, fever/chills.  No further witnessed rectal bleeding or black stool noted.  Reported that she was hungry.  Noted to be hypoglycemic with CBG around 30, patient in no acute distress.  Assessment/Plan: Principal Problem:   Symptomatic anemia Active Problems:   Hypertension   Smoker   ESRD on dialysis ([place])   Diarrhea   Rectal bleeding  Symptomatic anemia likely 2/2 lower GI bleed Hemoglobin noted to be 6.6 on admission Status post 3 units of packed RBC CT showed possible ascending colitis Colonoscopy done in 2012 showed diverticulosis, tubular adenoma GI consulted, appreciate recs Iron supplementation Monitor CBC closely  Possible colitis Currently afebrile, with no leukocytosis BC x2 pending GI panel/C. difficile pending CT scan showed possible ascending colitis Started on IV Zosyn GI consulted, appreciate recs   Syncope Likely 2/2 symptomatic anemia, rule out cardiac causes EKG with no acute ST changes Echo pending CT head pending Telemetry, closely  Hypoglycemia Likely due to poor oral intake CBG every 4 hours Hypoglycemic protocol  Mild transaminitis CT abdomen/pelvis without any hepatobiliary abnormality ??metastatic disease to bone Daily CMP, will trend  Hypokalemia Replace PRN  ESRD HD on Monday/Wednesday/Friday Nephrology on board  Hypertension/hyperlipidemia/CAD Work-up as above Continue atenolol, Lipitor Hold ASA due to rectal bleeding  Gout Continue allopurinol  ??Lung cancer, spiculated nodule in the right upper lobe Follows with Dr. Earlie Server, work-up in progress  Tobacco abuse Advised to quit Nicotine patch           Malnutrition Type:      Malnutrition Characteristics:      Nutrition Interventions:       Estimated body mass index is 22.73 kg/m as calculated from the following:   Height as of this encounter: 5\' 11"  (1.803 m).   Weight as of this encounter: 73.9 kg.     Code Status: Full  Family Communication: None at bedside  Disposition Plan: To be determined   Consultants:  Nephrology  Procedures:  None  Antimicrobials:  Zosyn  DVT prophylaxis: SCDs   Objective: Vitals:   10/18/18 0700 10/18/18 0745 10/18/18 0800 10/18/18 1042  BP: 131/72 130/70 134/79   Pulse: 63 62 63   Resp: (!) 21 20 (!) 23   Temp:    97.8 F (36.6 C)  TempSrc:    Oral  SpO2: 92% 94% 93%   Weight:      Height:  Intake/Output Summary (Last 24 hours) at 10/18/2018 1128 Last data filed at 10/18/2018 0602 Gross per 24 hour  Intake 2165 ml  Output -  Net 2165 ml   Filed Weights   10/17/18 1741  Weight: 73.9 kg    Exam:  General: NAD, chronically ill-appearing, very dry skin  Cardiovascular: S1, S2 present  Respiratory:  Diminished breath sounds bilaterally  Abdomen: Soft, nontender, nondistended, bowel sounds present   Musculoskeletal: Chronic venous skin changes, dry flaky, no bilateral pedal edema noted  Skin:  Very dry skin  Psychiatry: Normal mood    Data Reviewed: CBC: Recent Labs  Lab 10/17/18 1751 10/18/18 0500  WBC 4.5 5.3  HGB 6.6* 10.1*  HCT 21.6* 30.8*  MCV 105.9* 98.4  PLT 145* 983*   Basic Metabolic Panel: Recent Labs  Lab 10/17/18 1751 10/18/18 0500  NA 135 133*  K 2.8* 2.8*  CL 94* 94*  CO2 28 29  GLUCOSE 95 77  BUN 16 17  CREATININE 4.44* 4.63*  CALCIUM 8.7* 8.8*   GFR: Estimated Creatinine Clearance: 12.8 mL/min (A) (by C-G formula based on SCr of 4.63 mg/dL (H)). Liver Function Tests: Recent Labs  Lab 10/17/18 1751 10/18/18 0500  AST 21 22  ALT 10 11  ALKPHOS 141* 130*  BILITOT 1.1 1.8*  PROT 7.4 6.7  ALBUMIN 2.5* 2.4*   No results for input(s): LIPASE, AMYLASE in the last 168 hours. No results for input(s): AMMONIA in the last 168 hours. Coagulation Profile: No results for input(s): INR, PROTIME in the last 168 hours. Cardiac Enzymes: No results for input(s): CKTOTAL, CKMB, CKMBINDEX, TROPONINI in the last 168 hours. BNP (last 3 results) No results for input(s): PROBNP in the last 8760 hours. HbA1C: No results for input(s): HGBA1C in the last 72 hours. CBG: Recent Labs  Lab 10/18/18 1046 10/18/18 1047  GLUCAP 31* 30*   Lipid Profile: No results for input(s): CHOL, HDL, LDLCALC, TRIG, CHOLHDL, LDLDIRECT in the last 72 hours. Thyroid Function Tests: No results for input(s): TSH, T4TOTAL, FREET4, T3FREE, THYROIDAB in the last 72 hours. Anemia Panel: No results for input(s): VITAMINB12, FOLATE, FERRITIN, TIBC, IRON, RETICCTPCT in the last 72 hours. Urine analysis:    Component Value Date/Time   COLORURINE YELLOW 08/27/2013 0546   APPEARANCEUR CLOUDY (A) 08/27/2013 0546   LABSPEC 1.026 08/27/2013 0546   PHURINE 7.5 08/27/2013 0546   GLUCOSEU NEGATIVE 08/27/2013 0546   HGBUR TRACE (A) 08/27/2013 0546   BILIRUBINUR SMALL (A) 08/27/2013 0546    KETONESUR NEGATIVE 08/27/2013 0546   PROTEINUR >300 (A) 08/27/2013 0546   UROBILINOGEN 0.2 08/27/2013 0546   NITRITE NEGATIVE 08/27/2013 0546   LEUKOCYTESUR NEGATIVE 08/27/2013 0546   Sepsis Labs: @LABRCNTIP (procalcitonin:4,lacticidven:4)  ) Recent Results (from the past 240 hour(s))  SARS CORONAVIRUS 2 (TAT 6-24 HRS) Nasopharyngeal Nasopharyngeal Swab     Status: None   Collection Time: 10/18/18 12:05 AM   Specimen: Nasopharyngeal Swab  Result Value Ref Range Status   SARS Coronavirus 2 NEGATIVE NEGATIVE Final    Comment: (NOTE) SARS-CoV-2 target nucleic acids are NOT DETECTED. The SARS-CoV-2 RNA is generally detectable in upper and lower respiratory specimens during the acute phase of infection. Negative results do not preclude SARS-CoV-2 infection, do not rule out co-infections with other pathogens, and should not be used as the sole basis for treatment or other patient management decisions. Negative results must be combined with clinical observations, patient history, and epidemiological information. The expected result is Negative. Fact Sheet for Patients: SugarRoll.be Fact Sheet for  Healthcare Providers: https://www.woods-mathews.com/ This test is not yet approved or cleared by the [place] and  has been authorized for detection and/or diagnosis of SARS-CoV-2 by FDA under an Emergency Use Authorization (EUA). This EUA will remain  in effect (meaning this test can be used) for the duration of the COVID-19 declaration under Section 56 4(b)(1) of the Act, 21 U.S.C. section 360bbb-3(b)(1), unless the authorization is terminated or revoked sooner. Performed at [facility], [place] [address], [place], [place] [address]       Studies: Ct Abdomen Pelvis Wo Contrast  Result Date: 10/18/2018 CLINICAL DATA:  Follow-up colitis EXAM: CT ABDOMEN AND PELVIS WITHOUT CONTRAST TECHNIQUE: Multidetector CT imaging of the abdomen  and pelvis was performed following the standard protocol without IV contrast. COMPARISON:  09/30/2018 FINDINGS: Lower chest: Small right-sided pleural effusion is noted which has increased slightly in the interval from the prior exam. No focal confluent infiltrate is seen. Cardiomegaly is noted. Hepatobiliary: No focal liver abnormality is seen. No gallstones, gallbladder wall thickening, or biliary dilatation. Pancreas: Unremarkable. No pancreatic ductal dilatation or surrounding inflammatory changes. Spleen: Normal in size without focal abnormality. Adrenals/Urinary Tract: Adrenal glands are stable in appearance. The native kidneys are atrophic. The bladder is decompressed. No obstructive changes are seen. Stomach/Bowel: Diverticulosis is noted without evidence of definitive diverticulitis. No obstructive changes are seen. Persistent wall thickening in the ascending colon is noted again suspicious for colitis. The appendix is within normal limits. No small bowel abnormality is seen. The stomach is unremarkable. Vascular/Lymphatic: Aortic atherosclerosis. No enlarged abdominal or pelvic lymph nodes. Reproductive: Uterus and bilateral adnexa are unremarkable. Other: Mild ascites is noted as well as changes of anasarca these changes are similar to that seen on the prior exam and may contribute somewhat to the colonic edema. Musculoskeletal: Degenerative changes of the lumbar spine are noted. IMPRESSION: Stable edematous changes in the ascending colon consistent with colitis although the mild ascites and third spacing of fluid may contribute to this abnormality. Changes of anasarca and mild ascites. Slight increase in right-sided pleural effusion when compare with the prior exam. Electronically Signed   By: Inez Catalina M.D.   On: 10/18/2018 06:33    Scheduled Meds: . allopurinol  100 mg Oral Daily  . atenolol  100 mg Oral Daily  . atorvastatin  10 mg Oral QHS  . calcitRIOL  1.25 mcg Oral Daily  .  Chlorhexidine Gluconate Cloth  6 each Topical Q0600  . feeding supplement (PRO-STAT SUGAR FREE 64)  30 mL Oral BID  . potassium chloride  20 mEq Oral Once  . sodium chloride flush  3 mL Intravenous Q12H  . sucroferric oxyhydroxide  1,000 mg Oral TID WC  . thiamine  100 mg Oral Daily    Continuous Infusions: . sodium chloride    . piperacillin-tazobactam (ZOSYN)  IV       LOS: 0 days     Alma Friendly, MD [facility]  If 7PM-7AM, please contact night-coverage [email or web address] 10/18/2018, 11:28 AM

## 2018-10-18 NOTE — Consult Note (Addendum)
[facility] Renal Consultation Note    Indication for Consultation:  Management of ESRD/hemodialysis; anemia, hypertension/volume and secondary hyperparathyroidism PCP: Dr. Glendale Chard Oncology: Dr. Mayme Genta  HPI: Jessica Clarke is a 68 y.o. female with ESRD on hemodialysis MWF at [facility]. PMH: HTN, scleroderma, gout, CAD, scattered diverticulosis, sessile rectal polyp, spiculated RUL nodule suspicious for non-small cell lung cancer (last seen by oncology 03/05/2018), current smoker, AOCD, SHPT.  Was seen in ED for rectal bleeding 09/29/2018. CT of abd/pelvis 09/30/2018 with findings suggestive of ascending colitis, possibly infectious/inflammatory. Colonic diverticulosis, third spacing/volume overload with generalized soft tissue edema, small volume ascites, small R. Pleural effusion.   Patient presented to ED with C/O rectal bleeding, diarrhea for past few days. Apparently she went to [facility] for food and was found on floor. HGB on arrival to ED 6.6. She has rec'd two units PRBCS and has been admitted for rectal bleeding/Syncope. GI has been consulted.   Past Medical History:  Diagnosis Date  . Abnormal Pap smear    ASCUS ?HGSIL  . Anemia   . Cervical dysplasia   . CHF (congestive heart failure) ([place])   . Chronic kidney disease   . Constipation   . Constipation   . GERD (gastroesophageal reflux disease)   . Gout   . Hyperlipidemia   . Hypertension   . Myocardial infarction ([place]) 2015  . Obesity   . Pneumonia   . Uterine polyp    Past Surgical History:  Procedure Laterality Date  . AV FISTULA PLACEMENT  01/18/2012   Procedure: ARTERIOVENOUS (AV) FISTULA CREATION;  Surgeon: Angelia Mould, MD;  Location: [place];  Service: Vascular;  Laterality: Left;  . DILATION AND CURETTAGE OF UTERUS    . HYSTEROSCOPY    . LEEP    . LEFT HEART CATHETERIZATION WITH CORONARY ANGIOGRAM N/A 08/26/2013   Procedure: LEFT HEART CATHETERIZATION WITH CORONARY  ANGIOGRAM;  Surgeon: Leonie Man, MD;  Location: [facility] CATH LAB;  Service: Cardiovascular;  Laterality: N/A;  . REVISON OF ARTERIOVENOUS FISTULA Left 06/30/6158   Procedure: PLICATION OF ARTERIOVENOUS FISTULA;  Surgeon: Conrad Athelstan, MD;  Location: [place];  Service: Vascular;  Laterality: Left;   Family History  Problem Relation Age of Onset  . Esophageal cancer Other   . Pancreatic cancer Other   . Heart disease Mother   . Hypertension Mother   . Breast cancer Sister   . Esophageal cancer Sister   . Cancer Sister   . Deep vein thrombosis Sister   . Diabetes Sister   . Hypertension Sister   . Hypertension Daughter   . Colon cancer Neg Hx    Social History:  reports that she has been smoking cigarettes. She has a 15.00 pack-year smoking history. She has never used smokeless tobacco. She reports current alcohol use of about 6.0 standard drinks of alcohol per week. She reports that she does not use drugs. No Known Allergies Prior to Admission medications   Medication Sig Start Date End Date Taking? Authorizing Provider  acetaminophen (TYLENOL) 325 MG tablet Take 325-650 mg by mouth every 8 (eight) hours as needed for mild pain.    Yes [provider]  allopurinol (ZYLOPRIM) 100 MG tablet Take 100 mg by mouth daily.     Yes [provider]  aspirin EC 325 MG tablet Take 1 tablet (325 mg total) by mouth daily. 08/27/13  Yes Thurnell Lose, MD  atenolol (TENORMIN) 100 MG tablet Take 1 tablet (100  mg total) by mouth daily. 08/27/13  Yes Thurnell Lose, MD  atorvastatin (LIPITOR) 10 MG tablet Take 10 mg by mouth at bedtime.  01/30/16  Yes [provider]  colchicine 0.6 MG tablet Take 0.6 mg by mouth daily as needed (as directed for gout flares or pain).  08/14/18  Yes [provider]  VELPHORO 500 MG chewable tablet Chew 2 tablets by mouth 3 (three) times daily with meals.  06/30/16  Yes [provider]  amoxicillin-clavulanate (AUGMENTIN) 500-125  MG tablet Take 1 tablet (500 mg total) by mouth See admin instructions. Take one daily. On days of dialysis take an additional dose during and after dialysis. Patient not taking: Reported on 10/17/2018 09/30/18   Mesner, Corene Cornea, MD  thiamine (VITAMIN B-1) 100 MG tablet Take 100 mg by mouth daily.    [provider]   Current Facility-Administered Medications  Medication Dose Route Frequency Provider Last Rate Last Dose  . 0.9 %  sodium chloride infusion  250 mL Intravenous PRN Jani Gravel, MD      . acetaminophen (TYLENOL) tablet 650 mg  650 mg Oral Q6H PRN Jani Gravel, MD       Or  . acetaminophen (TYLENOL) suppository 650 mg  650 mg Rectal Q6H PRN Jani Gravel, MD      . allopurinol (ZYLOPRIM) tablet 100 mg  100 mg Oral Daily Jani Gravel, MD      . atenolol (TENORMIN) tablet 100 mg  100 mg Oral Daily Jani Gravel, MD      . atorvastatin (LIPITOR) tablet 10 mg  10 mg Oral QHS Jani Gravel, MD      . calcitRIOL (ROCALTROL) capsule 1.25 mcg  1.25 mcg Oral Daily Valentina Gu, NP   1.25 mcg at 10/18/18 1029  . Chlorhexidine Gluconate Cloth 2 % PADS 6 each  6 each Topical Q0600 Valentina Gu, NP   6 each at 10/18/18 0800  . feeding supplement (PRO-STAT SUGAR FREE 64) liquid 30 mL  30 mL Oral BID Jani Gravel, MD   30 mL at 10/18/18 1029  . hydrocerin (EUCERIN) cream   Topical BID Alma Friendly, MD      . nicotine (NICODERM CQ - dosed in mg/24 hours) patch 14 mg  14 mg Transdermal Daily PRN Jani Gravel, MD      . piperacillin-tazobactam (ZOSYN) IVPB 3.375 g  3.375 g Intravenous Q12H Tyrone Apple, RPH      . potassium chloride SA (K-DUR) CR tablet 20 mEq  20 mEq Oral Once Jani Gravel, MD      . sodium chloride flush (NS) 0.9 % injection 3 mL  3 mL Intravenous Q12H Jani Gravel, MD   3 mL at 10/18/18 1029  . sodium chloride flush (NS) 0.9 % injection 3 mL  3 mL Intravenous PRN Jani Gravel, MD      . sucroferric oxyhydroxide [facility]) chewable tablet 1,000 mg  1,000 mg Oral TID WC Jani Gravel, MD      . thiamine (VITAMIN B-1) tablet 100 mg  100 mg Oral Daily Jani Gravel, MD       Labs: Basic Metabolic Panel: Recent Labs  Lab 10/17/18 1751 10/18/18 0500 10/18/18 1126  NA 135 133*  --   K 2.8* 2.8*  --   CL 94* 94*  --   CO2 28 29  --   GLUCOSE 95 77 156*  BUN 16 17  --   CREATININE 4.44* 4.63*  --   CALCIUM 8.7*  8.8*  --    Liver Function Tests: Recent Labs  Lab 10/17/18 1751 10/18/18 0500  AST 21 22  ALT 10 11  ALKPHOS 141* 130*  BILITOT 1.1 1.8*  PROT 7.4 6.7  ALBUMIN 2.5* 2.4*   No results for input(s): LIPASE, AMYLASE in the last 168 hours. No results for input(s): AMMONIA in the last 168 hours. CBC: Recent Labs  Lab 10/17/18 1751 10/18/18 0500  WBC 4.5 5.3  HGB 6.6* 10.1*  HCT 21.6* 30.8*  MCV 105.9* 98.4  PLT 145* 127*   Cardiac Enzymes: No results for input(s): CKTOTAL, CKMB, CKMBINDEX, TROPONINI in the last 168 hours. CBG: Recent Labs  Lab 10/18/18 0852 10/18/18 1046 10/18/18 1047  GLUCAP 63* 31* 30*   Iron Studies: No results for input(s): IRON, TIBC, TRANSFERRIN, FERRITIN in the last 72 hours. Studies/Results: Ct Abdomen Pelvis Wo Contrast  Result Date: 10/18/2018 CLINICAL DATA:  Follow-up colitis EXAM: CT ABDOMEN AND PELVIS WITHOUT CONTRAST TECHNIQUE: Multidetector CT imaging of the abdomen and pelvis was performed following the standard protocol without IV contrast. COMPARISON:  09/30/2018 FINDINGS: Lower chest: Small right-sided pleural effusion is noted which has increased slightly in the interval from the prior exam. No focal confluent infiltrate is seen. Cardiomegaly is noted. Hepatobiliary: No focal liver abnormality is seen. No gallstones, gallbladder wall thickening, or biliary dilatation. Pancreas: Unremarkable. No pancreatic ductal dilatation or surrounding inflammatory changes. Spleen: Normal in size without focal abnormality. Adrenals/Urinary Tract: Adrenal glands are stable in appearance. The native kidneys are atrophic.  The bladder is decompressed. No obstructive changes are seen. Stomach/Bowel: Diverticulosis is noted without evidence of definitive diverticulitis. No obstructive changes are seen. Persistent wall thickening in the ascending colon is noted again suspicious for colitis. The appendix is within normal limits. No small bowel abnormality is seen. The stomach is unremarkable. Vascular/Lymphatic: Aortic atherosclerosis. No enlarged abdominal or pelvic lymph nodes. Reproductive: Uterus and bilateral adnexa are unremarkable. Other: Mild ascites is noted as well as changes of anasarca these changes are similar to that seen on the prior exam and may contribute somewhat to the colonic edema. Musculoskeletal: Degenerative changes of the lumbar spine are noted. IMPRESSION: Stable edematous changes in the ascending colon consistent with colitis although the mild ascites and third spacing of fluid may contribute to this abnormality. Changes of anasarca and mild ascites. Slight increase in right-sided pleural effusion when compare with the prior exam. Electronically Signed   By: Inez Catalina M.D.   On: 10/18/2018 06:33    ROS: As per HPI otherwise negative.    Physical Exam: Vitals:   10/18/18 0700 10/18/18 0745 10/18/18 0800 10/18/18 1042  BP: 131/72 130/70 134/79   Pulse: 63 62 63   Resp: (!) 21 20 (!) 23   Temp:    97.8 F (36.6 C)  TempSrc:    Oral  SpO2: 92% 94% 93%   Weight:      Height:         General: Chronically ill appearing elderly female in no acute distress. Head: Normocephalic, atraumatic, sclera non-icteric, mucus membranes are moist Neck: Supple. JVD elevated 1/4 to mandible. Lungs: Bilateral breath sounds with bibasilar crackles. Decreased in bases. Breathing is unlabored. Heart: RRR with S1 S2. No murmurs, rubs, or gallops appreciated. Abdomen: Soft, non-tender, non-distended with normoactive bowel sounds. No rebound/guarding. No obvious abdominal masses. M-S:  Strength and tone appear  normal for age. Lower extremities: Trace-1+ BLE edema.  Neuro: Alert and oriented X 3/. Moves all extremities spontaneously. Psych:  Responds to questions slowly but appropriately with a flat affect. Dialysis Access: L AVF + bruit  Dialysis Orders: [place] M[facility] 4 hrs 180NRe 425/Autoflow 1.5 69 kg 3.0 K/ 2.0 Ca UFP 4 LUA AVF -Heparin 4000 units IV TIW (DC upon discharge, please!) -Mircera 60 mcg IV q 2 weeks (last HGB 6.4 10/16/18 Last ESA 10/04/18) -Calcitriol 1.25 mcg PO TIW  Assessment/Plan: 1.  Symptomatic Anemia/Rectal Bleeding. HGB 6.6 on adm. Has rec'd 2 units PRBCs. HGB now 10.1. GI consulted. Per primary. H/O diverticulosis/adenomatous polyp. Had ED visit 09/29/2018 for same. DC heparin with HD.  2. Possible Colitis-per primary. On Zosyn.  3. Volume Overload-Edema/JVD present after blood transfusion. HD for volume removal today. Current wt 4.9 kg above OP EDW.  4. Hypokalemia-given 40 MEQ KCL PO per primary. Use 4.0 K bath with HD today. Follow K+.  5.  ESRD - MWF via AVF. Has not missed HD. K+ 2.8. Use 4.0 K bath. No heparin.  6.  Hypertension/volume  -BP stable, volume as noted above. Lower volume as tolerated.  7.  Anemia  - As noted above. Give Aranesp 60 mcg with HD today (ESA due today). Follow HGB. Last OP Tsat 28% 16/10/96.  8.  Metabolic bone disease - Ca 8.8  C Ca 10.4 Continue binders, sensipar, reduce calcitriol dose to 1.0 mcg PO TIW.  9.  Nutrition - Albumin 2.4 Renal Diet, prostat, renal vits.  10. H/O Spiculated nodule RUL-follows with Dr. Earlie Server. Per primary 11. Mild Transaminitis-per primary.    Rita H. Owens Shark, NP-C 10/18/2018, 1:32 PM  [facility] [phone]  Pt seen, examined and agree w A/P as above.  Kelly Splinter  MD 10/18/2018, 4:38 PM

## 2018-10-19 ENCOUNTER — Inpatient Hospital Stay (HOSPITAL_COMMUNITY): Payer: Medicare Other

## 2018-10-19 DIAGNOSIS — K625 Hemorrhage of anus and rectum: Secondary | ICD-10-CM

## 2018-10-19 LAB — CBC WITH DIFFERENTIAL/PLATELET
Abs Immature Granulocytes: 0.03 10*3/uL (ref 0.00–0.07)
Basophils Absolute: 0 10*3/uL (ref 0.0–0.1)
Basophils Relative: 0 %
Eosinophils Absolute: 0.1 10*3/uL (ref 0.0–0.5)
Eosinophils Relative: 1 %
HCT: 31.1 % — ABNORMAL LOW (ref 36.0–46.0)
Hemoglobin: 10.1 g/dL — ABNORMAL LOW (ref 12.0–15.0)
Immature Granulocytes: 1 %
Lymphocytes Relative: 13 %
Lymphs Abs: 0.7 10*3/uL (ref 0.7–4.0)
MCH: 32.1 pg (ref 26.0–34.0)
MCHC: 32.5 g/dL (ref 30.0–36.0)
MCV: 98.7 fL (ref 80.0–100.0)
Monocytes Absolute: 0.5 10*3/uL (ref 0.1–1.0)
Monocytes Relative: 9 %
Neutro Abs: 4.2 10*3/uL (ref 1.7–7.7)
Neutrophils Relative %: 76 %
Platelets: 98 10*3/uL — ABNORMAL LOW (ref 150–400)
RBC: 3.15 MIL/uL — ABNORMAL LOW (ref 3.87–5.11)
RDW: 18.3 % — ABNORMAL HIGH (ref 11.5–15.5)
WBC: 5.6 10*3/uL (ref 4.0–10.5)
nRBC: 0 % (ref 0.0–0.2)

## 2018-10-19 LAB — COMPREHENSIVE METABOLIC PANEL
ALT: 10 U/L (ref 0–44)
AST: 22 U/L (ref 15–41)
Albumin: 2.3 g/dL — ABNORMAL LOW (ref 3.5–5.0)
Alkaline Phosphatase: 140 U/L — ABNORMAL HIGH (ref 38–126)
Anion gap: 12 (ref 5–15)
BUN: 9 mg/dL (ref 8–23)
CO2: 26 mmol/L (ref 22–32)
Calcium: 8.8 mg/dL — ABNORMAL LOW (ref 8.9–10.3)
Chloride: 96 mmol/L — ABNORMAL LOW (ref 98–111)
Creatinine, Ser: 3.37 mg/dL — ABNORMAL HIGH (ref 0.44–1.00)
GFR calc Af Amer: 15 mL/min — ABNORMAL LOW (ref >60)
GFR calc non Af Amer: 13 mL/min — ABNORMAL LOW (ref >60)
Glucose, Bld: 108 mg/dL — ABNORMAL HIGH (ref 70–99)
Potassium: 3.3 mmol/L — ABNORMAL LOW (ref 3.5–5.1)
Sodium: 134 mmol/L — ABNORMAL LOW (ref 135–145)
Total Bilirubin: 1.6 mg/dL — ABNORMAL HIGH (ref 0.3–1.2)
Total Protein: 7 g/dL (ref 6.5–8.1)

## 2018-10-19 LAB — LIPID PANEL
Cholesterol: 69 mg/dL (ref 0–200)
HDL: 20 mg/dL — ABNORMAL LOW (ref >40)
LDL Cholesterol: 41 mg/dL (ref 0–99)
Total CHOL/HDL Ratio: 3.5 RATIO
Triglycerides: 42 mg/dL (ref <150)
VLDL: 8 mg/dL (ref 0–40)

## 2018-10-19 LAB — BPAM RBC
Blood Product Expiration Date: 202010052359
Blood Product Expiration Date: 202010052359
Blood Product Expiration Date: 202010052359
ISSUE DATE / TIME: 202009032220
ISSUE DATE / TIME: 202009040024
ISSUE DATE / TIME: 202009040338
Unit Type and Rh: 5100
Unit Type and Rh: 5100
Unit Type and Rh: 5100

## 2018-10-19 LAB — GLUCOSE, CAPILLARY
Glucose-Capillary: 117 mg/dL — ABNORMAL HIGH (ref 70–99)
Glucose-Capillary: 34 mg/dL — CL (ref 70–99)
Glucose-Capillary: 51 mg/dL — ABNORMAL LOW (ref 70–99)
Glucose-Capillary: 54 mg/dL — ABNORMAL LOW (ref 70–99)
Glucose-Capillary: 56 mg/dL — ABNORMAL LOW (ref 70–99)
Glucose-Capillary: 71 mg/dL (ref 70–99)
Glucose-Capillary: 74 mg/dL (ref 70–99)
Glucose-Capillary: 75 mg/dL (ref 70–99)
Glucose-Capillary: 89 mg/dL (ref 70–99)
Glucose-Capillary: <10 mg/dL — CL (ref 70–99)

## 2018-10-19 LAB — TYPE AND SCREEN
ABO/RH(D): O POS
Antibody Screen: NEGATIVE
Unit division: 0
Unit division: 0
Unit division: 0

## 2018-10-19 LAB — C DIFFICILE QUICK SCREEN W PCR REFLEX
C Diff antigen: POSITIVE — AB
C Diff toxin: NEGATIVE

## 2018-10-19 LAB — GLUCOSE, RANDOM: Glucose, Bld: 150 mg/dL — ABNORMAL HIGH (ref 70–99)

## 2018-10-19 LAB — CLOSTRIDIUM DIFFICILE BY PCR, REFLEXED: Toxigenic C. Difficile by PCR: NEGATIVE

## 2018-10-19 LAB — HIV ANTIBODY (ROUTINE TESTING W REFLEX): HIV Screen 4th Generation wRfx: NONREACTIVE

## 2018-10-19 MED ORDER — POTASSIUM CHLORIDE 10 MEQ/100ML IV SOLN
10.0000 meq | INTRAVENOUS | Status: AC
  Administered 2018-10-19 (×2): 10 meq via INTRAVENOUS
  Filled 2018-10-19 (×2): qty 100

## 2018-10-19 MED ORDER — DEXTROSE 50 % IV SOLN
INTRAVENOUS | Status: AC
  Administered 2018-10-19: 21:00:00 50 mL
  Filled 2018-10-19: qty 50

## 2018-10-19 MED ORDER — DEXTROSE 10 % IV SOLN
INTRAVENOUS | Status: DC
  Administered 2018-10-19: 23:00:00 via INTRAVENOUS

## 2018-10-19 MED ORDER — DEXTROSE 50 % IV SOLN
12.5000 g | Freq: Once | INTRAVENOUS | Status: AC
  Administered 2018-10-19: 12.5 g via INTRAVENOUS

## 2018-10-19 MED ORDER — DEXTROSE 50 % IV SOLN
INTRAVENOUS | Status: AC
  Administered 2018-10-19: 18:00:00
  Filled 2018-10-19: qty 50

## 2018-10-19 NOTE — Progress Notes (Addendum)
PROGRESS NOTE  Jessica Clarke YQM:[id number] DOB: 20-May-1950 DOA: 10/17/2018 PCP: Glendale Chard, MD  HPI/Recap of past 24 hours: HPI from Dr Maudie Mercury [name]  is a 68 y.o. female,  w lung cancer, hypertension, hyperlipidemia, CAD, CHF, ESRD on HD (MWF), Anemia , hx of diverticulosis/ adenomatous polyp in colonoscopy on 05/24/2010. On 8/17, presents to the ED with c/o diarrhea for the past few weeks as well as rectal bleeding, both intermittent, found to have colitis on CT, discharged on p.o. Augmentin.  Pt presented to ER for similar complaints, noted black stools, with also some frank blood in his stools, with diarrhea.  Patient left the lobby, went to try to get something to eat at [facility] and was found on floor.  Reported LOC, hitting her head, sustained a laceration above right eyebrow.  In the ED, patient noted to have a hemoglobin of 6.6, hypokalemia 2.8.  Patient was transfused.  Hospitalist called to admit patient.   Today, patient denies any new complaints.  Still having some loose darkish stools.  No frank blood noted.  Denies any chest pain, shortness of breath, abdominal pain, nausea/vomiting, fever/chills.   Assessment/Plan: Principal Problem:   Symptomatic anemia Active Problems:   Hypertension   Smoker   ESRD on dialysis ([place])   Diarrhea   Rectal bleeding  Symptomatic anemia likely 2/2 lower GI bleed Hemoglobin noted to be 6.6 on admission Status post 3 units of packed RBC, remained fairly stable CT showed possible ascending colitis Colonoscopy done in 2012 showed diverticulosis, tubular adenoma GI consulted, plan for colonoscopy on 10/20/2018-GI prep, may need NG tube Iron supplementation Monitor CBC closely  Possible colitis Currently afebrile, with no leukocytosis BC x2 pending GI panel pending C. difficile positive for antigen, but negative for toxin, spoke to ID Dr Baxter Flattery, likely colonization, no need to treat (except if worsening diarrhea, fever, significantly  elevated WBC, sepsis etc) CT scan showed possible ascending colitis Continue IV Zosyn GI consulted, appreciate recs  Syncope Likely 2/2 symptomatic anemia, rule out cardiac causes EKG with no acute ST changes Echo with EF of 60 to 65%, left ventricular diastolic Doppler parameters are consistent with impaired relaxation CT head no acute intracranial abnormality Telemetry  Hypoglycemia Improving Likely due to poor oral intake CBG every 4 hours Hypoglycemic protocol  Mild transaminitis CT abdomen/pelvis without any hepatobiliary abnormality ??metastatic disease to bone Daily CMP, will trend  Hypokalemia Replace PRN  ESRD HD on Monday/Wednesday/Friday Nephrology on board  Hypertension/hyperlipidemia/CAD Continue atenolol, Lipitor Hold ASA due to rectal bleeding  Gout Continue allopurinol  Hx of scleroderma Very dry skin Eucerin cream for moisturizing   ??Lung cancer, spiculated nodule in the right upper lobe Spoke with Dr. Earlie Server, recommending contacting CTS to evaluate patient for surgical candidate.he will follow up with patient CTS consulted, awaiting call back  Tobacco abuse Advised to quit Nicotine patch           Malnutrition Type:      Malnutrition Characteristics:      Nutrition Interventions:       Estimated body mass index is 21.86 kg/m as calculated from the following:   Height as of this encounter: 5' 11"  (1.803 m).   Weight as of this encounter: 71.1 kg.     Code Status: Full  Family Communication: None at bedside  Disposition Plan: To be determined   Consultants:  Nephrology  GI  Procedures:  None  Antimicrobials:  Zosyn  DVT prophylaxis: SCDs   Objective: Vitals:   10/19/18  3299 10/19/18 0834 10/19/18 0856 10/19/18 1141  BP:  115/66 115/71 (!) 106/95  Pulse:  71 73 67  Resp: 20 19 (!) 21 (!) 31  Temp:   97.9 F (36.6 C) 98.4 F (36.9 C)  TempSrc:   Axillary Oral  SpO2:   93% 94%  Weight:       Height:        Intake/Output Summary (Last 24 hours) at 10/19/2018 1445 Last data filed at 10/19/2018 0300 Gross per 24 hour  Intake 42.7 ml  Output 3500 ml  Net -3457.3 ml   Filed Weights   10/18/18 1720 10/18/18 2125 10/19/18 0500  Weight: 72.9 kg 69.4 kg 71.1 kg    Exam:  General: NAD  Cardiovascular: S1, S2 present  Respiratory:  Diminished breath sounds bilaterally  Abdomen: Soft, nontender, nondistended, bowel sounds present  Musculoskeletal: Trace nonpitting bilateral pedal edema noted, chronic venous skin changes  Skin:  Very dry skin  Psychiatry: Normal mood   Data Reviewed: CBC: Recent Labs  Lab 10/17/18 1751 10/18/18 0500 10/18/18 1750 10/19/18 0550  WBC 4.5 5.3 6.0 5.6  NEUTROABS  --   --   --  4.2  HGB 6.6* 10.1* 10.7* 10.1*  HCT 21.6* 30.8* 31.4* 31.1*  MCV 105.9* 98.4 95.4 98.7  PLT 145* 127* 127* 98*   Basic Metabolic Panel: Recent Labs  Lab 10/17/18 1751 10/18/18 0500 10/18/18 1126 10/18/18 1750 10/18/18 2352 10/19/18 0550  NA 135 133*  --  131*  --  134*  K 2.8* 2.8*  --  3.7  --  3.3*  CL 94* 94*  --  94*  --  96*  CO2 28 29  --  25  --  26  GLUCOSE 95 77 156* 79 150* 108*  BUN 16 17  --  18  --  9  CREATININE 4.44* 4.63*  --  5.10*  --  3.37*  CALCIUM 8.7* 8.8*  --  8.7*  --  8.8*  MG  --  1.8  --   --   --   --   PHOS  --   --   --  4.1  --   --    GFR: Estimated Creatinine Clearance: 17.6 mL/min (A) (by C-G formula based on SCr of 3.37 mg/dL (H)). Liver Function Tests: Recent Labs  Lab 10/17/18 1751 10/18/18 0500 10/18/18 1750 10/19/18 0550  AST 21 22  --  22  ALT 10 11  --  10  ALKPHOS 141* 130*  --  140*  BILITOT 1.1 1.8*  --  1.6*  PROT 7.4 6.7  --  7.0  ALBUMIN 2.5* 2.4* 2.4* 2.3*   No results for input(s): LIPASE, AMYLASE in the last 168 hours. No results for input(s): AMMONIA in the last 168 hours. Coagulation Profile: No results for input(s): INR, PROTIME in the last 168 hours. Cardiac Enzymes: No  results for input(s): CKTOTAL, CKMB, CKMBINDEX, TROPONINI in the last 168 hours. BNP (last 3 results) No results for input(s): PROBNP in the last 8760 hours. HbA1C: No results for input(s): HGBA1C in the last 72 hours. CBG: Recent Labs  Lab 10/18/18 2240 10/18/18 2300 10/18/18 2322 10/19/18 0814 10/19/18 1145  GLUCAP <10* <10* 155* 74 89   Lipid Profile: Recent Labs    10/19/18 0550  CHOL 69  HDL 20*  LDLCALC 41  TRIG 42  CHOLHDL 3.5   Thyroid Function Tests: No results for input(s): TSH, T4TOTAL, FREET4, T3FREE, THYROIDAB in the last 72 hours. Anemia  Panel: No results for input(s): VITAMINB12, FOLATE, FERRITIN, TIBC, IRON, RETICCTPCT in the last 72 hours. Urine analysis:    Component Value Date/Time   COLORURINE YELLOW 08/27/2013 0546   APPEARANCEUR CLOUDY (A) 08/27/2013 0546   LABSPEC 1.026 08/27/2013 0546   PHURINE 7.5 08/27/2013 0546   GLUCOSEU NEGATIVE 08/27/2013 0546   HGBUR TRACE (A) 08/27/2013 0546   BILIRUBINUR SMALL (A) 08/27/2013 0546   KETONESUR NEGATIVE 08/27/2013 0546   PROTEINUR >300 (A) 08/27/2013 0546   UROBILINOGEN 0.2 08/27/2013 0546   NITRITE NEGATIVE 08/27/2013 0546   LEUKOCYTESUR NEGATIVE 08/27/2013 0546   Sepsis Labs: @LABRCNTIP (procalcitonin:4,lacticidven:4)  ) Recent Results (from the past 240 hour(s))  SARS CORONAVIRUS 2 (TAT 6-24 HRS) Nasopharyngeal Nasopharyngeal Swab     Status: None   Collection Time: 10/18/18 12:05 AM   Specimen: Nasopharyngeal Swab  Result Value Ref Range Status   SARS Coronavirus 2 NEGATIVE NEGATIVE Final    Comment: (NOTE) SARS-CoV-2 target nucleic acids are NOT DETECTED. The SARS-CoV-2 RNA is generally detectable in upper and lower respiratory specimens during the acute phase of infection. Negative results do not preclude SARS-CoV-2 infection, do not rule out co-infections with other pathogens, and should not be used as the sole basis for treatment or other patient management decisions. Negative results  must be combined with clinical observations, patient history, and epidemiological information. The expected result is Negative. Fact Sheet for Patients: SugarRoll.be Fact Sheet for Healthcare Providers: https://www.woods-mathews.com/ This test is not yet approved or cleared by the [place] FDA and  has been authorized for detection and/or diagnosis of SARS-CoV-2 by FDA under an Emergency Use Authorization (EUA). This EUA will remain  in effect (meaning this test can be used) for the duration of the COVID-19 declaration under Section 56 4(b)(1) of the Act, 21 U.S.C. section 360bbb-3(b)(1), unless the authorization is terminated or revoked sooner. Performed at [facility], [place] [address], [place], [place] [address]   Culture, blood (routine x 2)     Status: None (Preliminary result)   Collection Time: 10/18/18 12:10 PM   Specimen: BLOOD RIGHT HAND  Result Value Ref Range Status   Specimen Description BLOOD RIGHT HAND  Final   Special Requests   Final    BOTTLES DRAWN AEROBIC ONLY Blood Culture adequate volume   Culture   Final    NO GROWTH 1 DAY Performed at [facility], [place] [address] [place], [place] [address]    Report Status PENDING  Incomplete      Studies: Ct Head Wo Contrast  Result Date: 10/19/2018 CLINICAL DATA:  Fall, hit head EXAM: CT HEAD WITHOUT CONTRAST TECHNIQUE: Contiguous axial images were obtained from the base of the skull through the vertex without intravenous contrast. COMPARISON:  None. FINDINGS: Brain: There is atrophy and chronic small vessel disease changes. No acute intracranial abnormality. Specifically, no hemorrhage, hydrocephalus, mass lesion, acute infarction, or significant intracranial injury. Vascular: No hyperdense vessel or unexpected calcification. Skull: No acute calvarial abnormality. Sinuses/Orbits: Visualized paranasal sinuses and mastoids clear. Orbital soft tissues unremarkable.  Other: None IMPRESSION: Atrophy, chronic microvascular disease. No acute intracranial abnormality. Electronically Signed   By: Rolm Baptise M.D.   On: 10/19/2018 02:25    Scheduled Meds: . allopurinol  100 mg Oral Daily  . atenolol  100 mg Oral Daily  . atorvastatin  10 mg Oral QHS  . calcitRIOL  1 mcg Oral Q M,W,F-HD  . Chlorhexidine Gluconate Cloth  6 each Topical Q0600  . cinacalcet  30  mg Oral Q supper  . [START ON 10/25/2018] darbepoetin (ARANESP) injection - DIALYSIS  60 mcg Intravenous Q Fri-HD  . feeding supplement (PRO-STAT SUGAR FREE 64)  30 mL Oral BID  . hydrocerin   Topical BID  . multivitamin  1 tablet Oral QHS  . peg 3350 powder  0.5 kit Oral Once   And  . [START ON 10/20/2018] peg 3350 powder  0.5 kit Oral Once  . potassium chloride  20 mEq Oral Once  . sodium chloride flush  3 mL Intravenous Q12H  . sucroferric oxyhydroxide  1,000 mg Oral TID WC  . thiamine  100 mg Oral Daily    Continuous Infusions: . sodium chloride    . piperacillin-tazobactam (ZOSYN)  IV 3.375 g (10/19/18 0958)     LOS: 1 day     Alma Friendly, MD [facility]  If 7PM-7AM, please contact night-coverage [email or web address] 10/19/2018, 2:45 PM

## 2018-10-19 NOTE — Procedures (Addendum)
Progress Note   Subjective  Continues to have water, dark/black but not grossly bloody stools. No abdominal pain. No nausea of vomiting today. Hard of hearing. No family present at the bedside.    Objective  Vital signs in last 24 hours: Temp:  [97.5 F (36.4 C)-99 F (37.2 C)] 97.9 F (36.6 C) (09/05 0856) Pulse Rate:  [59-73] 73 (09/05 0856) Resp:  [16-34] 21 (09/05 0856) BP: (98-137)/(57-96) 115/71 (09/05 0856) SpO2:  [93 %-99 %] 93 % (09/05 0856) Weight:  [69.4 kg-72.9 kg] 71.1 kg (09/05 0500)    General: Alert, frail woman, who appears older than her stated age in NAD Heart:  Regular rate and rhythm; no murmurs Chest: Clear to ascultation bilaterally Abdomen:  Soft, nontender and nondistended. Normal bowel sounds, without guarding, and without rebound.  No fluid wave.  Extremities:  Nonpitting edema in the legs and feet. Palpable thrill at the AVG in left upper arm.  Neurologic:  Alert and  oriented x4; grossly normal neurologically. Psych:  Alert and cooperative. Normal mood and affect.  Intake/Output from previous day: 09/04 0701 - 09/05 0700 In: 42.7 [IV Piggyback:42.7] Out: 3500  Intake/Output this shift: No intake/output data recorded.  Lab Results: Recent Labs    10/18/18 0500 10/18/18 1750 10/19/18 0550  WBC 5.3 6.0 5.6  HGB 10.1* 10.7* 10.1*  HCT 30.8* 31.4* 31.1*  PLT 127* 127* 98*   BMET Recent Labs    10/18/18 0500  10/18/18 1750 10/18/18 2352 10/19/18 0550  NA 133*  --  131*  --  134*  K 2.8*  --  3.7  --  3.3*  CL 94*  --  94*  --  96*  CO2 29  --  25  --  26  GLUCOSE 77   < > 79 150* 108*  BUN 17  --  18  --  9  CREATININE 4.63*  --  5.10*  --  3.37*  CALCIUM 8.8*  --  8.7*  --  8.8*   < > = values in this interval not displayed.   LFT Recent Labs    10/19/18 0550  PROT 7.0  ALBUMIN 2.3*  AST 22  ALT 10  ALKPHOS 140*  BILITOT 1.6*   Studies/Results: Ct Abdomen Pelvis Wo Contrast  Result Date: 10/18/2018 CLINICAL DATA:   Follow-up colitis EXAM: CT ABDOMEN AND PELVIS WITHOUT CONTRAST TECHNIQUE: Multidetector CT imaging of the abdomen and pelvis was performed following the standard protocol without IV contrast. COMPARISON:  09/30/2018 FINDINGS: Lower chest: Small right-sided pleural effusion is noted which has increased slightly in the interval from the prior exam. No focal confluent infiltrate is seen. Cardiomegaly is noted. Hepatobiliary: No focal liver abnormality is seen. No gallstones, gallbladder wall thickening, or biliary dilatation. Pancreas: Unremarkable. No pancreatic ductal dilatation or surrounding inflammatory changes. Spleen: Normal in size without focal abnormality. Adrenals/Urinary Tract: Adrenal glands are stable in appearance. The native kidneys are atrophic. The bladder is decompressed. No obstructive changes are seen. Stomach/Bowel: Diverticulosis is noted without evidence of definitive diverticulitis. No obstructive changes are seen. Persistent wall thickening in the ascending colon is noted again suspicious for colitis. The appendix is within normal limits. No small bowel abnormality is seen. The stomach is unremarkable. Vascular/Lymphatic: Aortic atherosclerosis. No enlarged abdominal or pelvic lymph nodes. Reproductive: Uterus and bilateral adnexa are unremarkable. Other: Mild ascites is noted as well as changes of anasarca these changes are similar to that seen on the prior exam and may contribute somewhat to  the colonic edema. Musculoskeletal: Degenerative changes of the lumbar spine are noted. IMPRESSION: Stable edematous changes in the ascending colon consistent with colitis although the mild ascites and third spacing of fluid may contribute to this abnormality. Changes of anasarca and mild ascites. Slight increase in right-sided pleural effusion when compare with the prior exam. Electronically Signed   By: Inez Catalina M.D.   On: 10/18/2018 06:33   Ct Head Wo Contrast  Result Date: 10/19/2018 CLINICAL  DATA:  Fall, hit head EXAM: CT HEAD WITHOUT CONTRAST TECHNIQUE: Contiguous axial images were obtained from the base of the skull through the vertex without intravenous contrast. COMPARISON:  None. FINDINGS: Brain: There is atrophy and chronic small vessel disease changes. No acute intracranial abnormality. Specifically, no hemorrhage, hydrocephalus, mass lesion, acute infarction, or significant intracranial injury. Vascular: No hyperdense vessel or unexpected calcification. Skull: No acute calvarial abnormality. Sinuses/Orbits: Visualized paranasal sinuses and mastoids clear. Orbital soft tissues unremarkable. Other: None IMPRESSION: Atrophy, chronic microvascular disease. No acute intracranial abnormality. Electronically Signed   By: Rolm Baptise M.D.   On: 10/19/2018 02:25      Assessment & Recommendations  Recurrent diarrhea and painless rectal bleeding Symptomatic anemia with hemoccult - stools Abnormal CT scan: possible ascending colitis Diverticulosis History of colon polyp    - tubular adenoma on colonoscopy 2012 Spiculated RUL nodule suspicious for non-small cell lung cancer Scleroderma Gout Hypertension Hyperlipidemia CAD CHF ESRD on HD   Hemoglobin is stable. Patient remains hemodynamically stable.   GI pathogen panel pending. Colonoscopy 10/20/18 8:30. Slow bowel prep today with clear liquid diet. She may need an NG tube to complete the prep.    LOS: 1 day   Thornton Park  10/19/2018, 11:42 AM

## 2018-10-19 NOTE — Progress Notes (Signed)
[facility] Progress Note  Subjective: no specific c/o's.  Sitting up on side of bed. No SOB / CP.  -3.5 L on HD yest, BP's stable.   Vitals:   10/19/18 0658 10/19/18 0834 10/19/18 0856 10/19/18 1141  BP:  115/66 115/71 (!) 106/95  Pulse:  71 73 67  Resp: 20 19 (!) 21 (!) 31  Temp:   97.9 F (36.6 C) 98.4 F (36.9 C)  TempSrc:   Axillary Oral  SpO2:   93% 94%  Weight:      Height:        Inpatient medications: . allopurinol  100 mg Oral Daily  . atenolol  100 mg Oral Daily  . atorvastatin  10 mg Oral QHS  . calcitRIOL  1 mcg Oral Q M,W,F-HD  . Chlorhexidine Gluconate Cloth  6 each Topical Q0600  . cinacalcet  30 mg Oral Q supper  . [START ON 10/25/2018] darbepoetin (ARANESP) injection - DIALYSIS  60 mcg Intravenous Q Fri-HD  . feeding supplement (PRO-STAT SUGAR FREE 64)  30 mL Oral BID  . hydrocerin   Topical BID  . multivitamin  1 tablet Oral QHS  . peg 3350 powder  0.5 kit Oral Once   And  . [START ON 10/20/2018] peg 3350 powder  0.5 kit Oral Once  . sodium chloride flush  3 mL Intravenous Q12H  . sucroferric oxyhydroxide  1,000 mg Oral TID WC  . thiamine  100 mg Oral Daily   . sodium chloride    . piperacillin-tazobactam (ZOSYN)  IV 3.375 g (10/19/18 0958)  . potassium chloride 10 mEq (10/19/18 1545)   sodium chloride, acetaminophen **OR** acetaminophen, nicotine, sodium chloride flush    Exam: General: Chronically ill appearing elderly female in no acute distress Neck: Supple. JVD elevated 1/4 to mandible. Lungs: Bilateral breath sounds with mild bibasilar rales Heart: RRR with S1 S2. No murmurs, rubs, or gallops appreciated. Abdomen: Soft, non-tender, non-distended with normoactive bowel sounds. No rebound/guarding. No obvious abdominal masses. M-S:  Strength and tone appear normal for age. Lower extremities: Trace-1+ BLE edema.  Neuro: Alert and oriented X 3/. Moves all extremities spontaneously. Dialysis Access: L AVF + bruit  [facility]  MWF 4h  425/ A1.5  69kg  3K/2Ca bath  P4  LUA AVF  Heparin 4000 (DC upon discharge, please!) -Mircera 60 mcg IV q 2 weeks (last HGB 6.4 10/16/18 Last ESA 10/04/18) -Calcitriol 1.25 mcg PO TIW  Assessment/Plan: 1.  Symptomatic Anemia/Rectal Bleeding: HGB 6.6 on adm. Has rec'd 2 units PRBCs. HGB now 10.1. GI consulted. Per primary. H/O diverticulosis/adenomatous polyp. Had ED visit 09/29/2018 for same. DC heparin with HD.  2. Possible Colitis-per primary. On Zosyn.  3. Volume Overload-Edema/JVD on admit. 3.5L off yesterday, still up 2kg.  4. Hypokalemia- rec'd po Kdur but K+ down 3.3 5.  ESRD - MWF via AVF. Has not missed HD. Next HD Monday.  6.  Hypertension/volume  -BP stable, volume as above, cont to lower next hd 7.  Anemia  - As noted above. Give Aranesp 60 mcg with HD today (ESA due today). Follow HGB. Last OP Tsat 28% 96/22/29.  8.  Metabolic bone disease - Ca 8.8  C Ca 10.4 Continue binders, sensipar, reduce calcitriol dose to 1.0 mcg PO TIW.  9.  Nutrition - Albumin 2.4 Renal Diet, prostat, renal vits.  10. H/O Spiculated nodule RUL-follows with Dr. Earlie Server. Per primary 11. Mild Transaminitis-per primary.     [facility] 10/19/2018, 3:49  PM  Iron/TIBC/Ferritin/ %Sat    Component Value Date/Time   IRON 73 07/22/2013 1330   TIBC 201 (L) 07/22/2013 1330   FERRITIN 525 (H) 07/22/2013 1330   IRONPCTSAT 36 07/22/2013 1330   Recent Labs  Lab 10/18/18 1750  10/19/18 0550  NA 131*  --  134*  K 3.7  --  3.3*  CL 94*  --  96*  CO2 25  --  26  GLUCOSE 79   < > 108*  BUN 18  --  9  CREATININE 5.10*  --  3.37*  CALCIUM 8.7*  --  8.8*  PHOS 4.1  --   --   ALBUMIN 2.4*  --  2.3*   < > = values in this interval not displayed.   Recent Labs  Lab 10/19/18 0550  AST 22  ALT 10  ALKPHOS 140*  BILITOT 1.6*  PROT 7.0   Recent Labs  Lab 10/19/18 0550  WBC 5.6  HGB 10.1*  HCT 31.1*  PLT 98*

## 2018-10-19 NOTE — Progress Notes (Signed)
MD Beavers notified about patient refusal of NG tube insertion for bowel prep. Pt tolerating oral well.

## 2018-10-19 NOTE — Progress Notes (Signed)
Hypoglycemic Event  CBG: 34  Treatment: 8 oz juice/soda  Symptoms: Hungry  Follow-up CBG: Time:1700 CBG Result:54  Possible Reasons for Event: Inadequate meal intake  Comments/MD notified:Yes    Arby Barrette

## 2018-10-19 NOTE — Evaluation (Signed)
Physical Therapy Evaluation Patient Details Name: Jessica Clarke MRN: [id number] DOB: 1950-09-30 Today's Date: 10/19/2018   History of Present Illness  68 y.o. female pt presented to ED with C/O rectal bleeding, diarrhea for past few days. ESRD on HD MWF. PMH: HTN, scleroderma, gout, CAD, scattered diverticulosis, sessile rectal polyp, spiculated RUL nodule suspicious for non-small cell lung cancer (last seen by oncology 03/05/2018), current smoker, AOCD, SHPT.  Was seen in ED for rectal bleeding 09/29/2018.  Clinical Impression  Orders received for PT evaluation. Patient demonstrates deficits in functional mobility as indicated below. Will benefit from continued skilled PT to address deficits and maximize function. Will see as indicated and progress as tolerated.  Anticipate patient will need 24/7 supervision upon d/c home, if family(daughter) unable to provide then will likely need SNF.    Follow Up Recommendations Home health PT;Supervision/Assistance - 24 hour(if family cant provide 24/7 then may need SNF)    Equipment Recommendations  3in1 (PT)    Recommendations for Other Services       Precautions / Restrictions Precautions Precautions: Fall      Mobility  Bed Mobility Overal bed mobility: Needs Assistance Bed Mobility: Rolling;Supine to Sit;Sit to Supine Rolling: Min guard   Supine to sit: Min assist Sit to supine: Min assist   General bed mobility comments: min assist to elevate trunk and rotate to EOB, min assist to elevate LEs back to bed  Transfers Overall transfer level: Needs assistance Equipment used: 1 person hand held assist Transfers: Sit to/from Stand Sit to Stand: Min assist;Mod assist(mod assist to power up from toilet)         General transfer comment: min assist for stability  Ambulation/Gait Ambulation/Gait assistance: Min assist Gait Distance (Feet): 140 Feet Assistive device: 1 person hand held assist Gait Pattern/deviations: Drifts  right/left;Narrow base of support;Shuffle;Decreased stride length Gait velocity: decreased Gait velocity interpretation: <1.31 ft/sec, indicative of household ambulator General Gait Details: patient with noted instability and multiple balance checks LOB requiring hands on assist throughout [employer] Mobility    Modified Rankin (Stroke Patients Only)       Balance Overall balance assessment: Needs assistance   Sitting balance-Leahy Scale: Good     Standing balance support: During functional activity Standing balance-Leahy Scale: Fair                               Pertinent Vitals/Pain Pain Assessment: Faces Faces Pain Scale: Hurts even more Pain Location: abdomen pain with movement to EOB Pain Descriptors / Indicators: Grimacing;Guarding Pain Intervention(s): Monitored during session    Home Living Family/patient expects to be discharged to:: Private residence Living Arrangements: Alone Available Help at Discharge: Family Type of Home: Apartment Home Access: Stairs to enter Entrance Stairs-Rails: None Technical brewer of Steps: 2 Home Layout: One level Home Equipment: None      Prior Function Level of Independence: Independent               Hand Dominance   Dominant Hand: Right    Extremity/Trunk Assessment   Upper Extremity Assessment Upper Extremity Assessment: Generalized weakness    Lower Extremity Assessment Lower Extremity Assessment: Generalized weakness    Cervical / Trunk Assessment Cervical / Trunk Assessment: Kyphotic  Communication      Cognition Arousal/Alertness: Awake/alert Behavior During Therapy: WFL for tasks assessed/performed Overall Cognitive Status: No family/caregiver present to determine  baseline cognitive functioning                                        General Comments      Exercises     Assessment/Plan    PT Assessment Patient needs  continued PT services  PT Problem List Decreased strength;Decreased activity tolerance;Decreased balance;Decreased mobility;Decreased safety awareness       PT Treatment Interventions DME instruction;Gait training;Stair training;Functional mobility training;Therapeutic activities;Therapeutic exercise;Balance training    PT Goals (Current goals can be found in the Care Plan section)  Acute Rehab PT Goals Patient Stated Goal: to go home PT Goal Formulation: With patient Time For Goal Achievement: 11/02/18 Potential to Achieve Goals: Fair    Frequency Min 3X/week   Barriers to discharge Decreased caregiver support      Co-evaluation               AM-PAC PT "6 Clicks" Mobility  Outcome Measure Help needed turning from your back to your side while in a flat bed without using bedrails?: A Little Help needed moving from lying on your back to sitting on the side of a flat bed without using bedrails?: A Little Help needed moving to and from a bed to a chair (including a wheelchair)?: A Little Help needed standing up from a chair using your arms (e.g., wheelchair or bedside chair)?: A Little Help needed to walk in hospital room?: A Little Help needed climbing 3-5 steps with a railing? : A Lot 6 Click Score: 17    End of Session Equipment Utilized During Treatment: Gait belt Activity Tolerance: Patient limited by fatigue Patient left: in bed;with call bell/phone within reach;with family/visitor present Nurse Communication: Mobility status PT Visit Diagnosis: Unsteadiness on feet (R26.81);Difficulty in walking, not elsewhere classified (R26.2);Muscle weakness (generalized) (M62.81)    Time: 0383-3383 PT Time Calculation (min) (ACUTE ONLY): 22 min   Charges:   PT Evaluation $PT Eval Moderate Complexity: 1 Mod          Alben Deeds, PT DPT  Board Certified Neurologic Specialist Acute Rehabilitation Services  Office [place] 10/19/2018, 2:01  PM

## 2018-10-20 ENCOUNTER — Inpatient Hospital Stay (HOSPITAL_COMMUNITY): Payer: Medicare Other | Admitting: Certified Registered Nurse Anesthetist

## 2018-10-20 ENCOUNTER — Encounter (HOSPITAL_COMMUNITY): Payer: Self-pay | Admitting: Emergency Medicine

## 2018-10-20 ENCOUNTER — Encounter (HOSPITAL_COMMUNITY)
Admission: EM | Disposition: A | Discharge status: Home Health | Payer: Self-pay | Source: Home / Self Care | Attending: Internal Medicine

## 2018-10-20 DIAGNOSIS — F17200 Nicotine dependence, unspecified, uncomplicated: Secondary | ICD-10-CM

## 2018-10-20 DIAGNOSIS — K635 Polyp of colon: Secondary | ICD-10-CM

## 2018-10-20 DIAGNOSIS — K922 Gastrointestinal hemorrhage, unspecified: Secondary | ICD-10-CM

## 2018-10-20 HISTORY — PX: COLONOSCOPY WITH PROPOFOL: SHX5780

## 2018-10-20 HISTORY — PX: BIOPSY: SHX5522

## 2018-10-20 HISTORY — PX: POLYPECTOMY: SHX5525

## 2018-10-20 LAB — COMPREHENSIVE METABOLIC PANEL
ALT: UNDETERMINED U/L (ref 0–44)
AST: UNDETERMINED U/L (ref 15–41)
Albumin: 2.1 g/dL — ABNORMAL LOW (ref 3.5–5.0)
Alkaline Phosphatase: 132 U/L — ABNORMAL HIGH (ref 38–126)
Anion gap: 14 (ref 5–15)
BUN: 15 mg/dL (ref 8–23)
CO2: 20 mmol/L — ABNORMAL LOW (ref 22–32)
Calcium: 8.2 mg/dL — ABNORMAL LOW (ref 8.9–10.3)
Chloride: 98 mmol/L (ref 98–111)
Creatinine, Ser: 4.21 mg/dL — ABNORMAL HIGH (ref 0.44–1.00)
GFR calc Af Amer: 12 mL/min — ABNORMAL LOW (ref >60)
GFR calc non Af Amer: 10 mL/min — ABNORMAL LOW (ref >60)
Glucose, Bld: 85 mg/dL (ref 70–99)
Potassium: 4.4 mmol/L (ref 3.5–5.1)
Sodium: 132 mmol/L — ABNORMAL LOW (ref 135–145)
Total Bilirubin: UNDETERMINED mg/dL (ref 0.3–1.2)
Total Protein: 6.2 g/dL — ABNORMAL LOW (ref 6.5–8.1)

## 2018-10-20 LAB — GLUCOSE, CAPILLARY
Glucose-Capillary: 90 mg/dL (ref 70–99)
Glucose-Capillary: 90 mg/dL (ref 70–99)
Glucose-Capillary: 128 mg/dL — ABNORMAL HIGH (ref 70–99)
Glucose-Capillary: <10 mg/dL — CL (ref 70–99)
Glucose-Capillary: <10 mg/dL — CL (ref 70–99)
Glucose-Capillary: <10 mg/dL — CL (ref 70–99)
Glucose-Capillary: 80 mg/dL (ref 70–99)
Glucose-Capillary: 49 mg/dL — ABNORMAL LOW (ref 70–99)
Glucose-Capillary: 79 mg/dL (ref 70–99)
Glucose-Capillary: 86 mg/dL (ref 70–99)

## 2018-10-20 LAB — GASTROINTESTINAL PANEL BY PCR, STOOL (REPLACES STOOL CULTURE)

## 2018-10-20 LAB — CBC WITH DIFFERENTIAL/PLATELET
Abs Immature Granulocytes: 0.06 10*3/uL (ref 0.00–0.07)
Basophils Absolute: 0 10*3/uL (ref 0.0–0.1)
Basophils Relative: 0 %
Eosinophils Absolute: 0.1 10*3/uL (ref 0.0–0.5)
Eosinophils Relative: 2 %
HCT: 32.9 % — ABNORMAL LOW (ref 36.0–46.0)
Hemoglobin: 11 g/dL — ABNORMAL LOW (ref 12.0–15.0)
Immature Granulocytes: 1 %
Lymphocytes Relative: 17 %
Lymphs Abs: 1.2 10*3/uL (ref 0.7–4.0)
MCH: 32.2 pg (ref 26.0–34.0)
MCHC: 33.4 g/dL (ref 30.0–36.0)
MCV: 96.2 fL (ref 80.0–100.0)
Monocytes Absolute: 1 10*3/uL (ref 0.1–1.0)
Monocytes Relative: 14 %
Neutro Abs: 4.7 10*3/uL (ref 1.7–7.7)
Neutrophils Relative %: 66 %
Platelets: 98 10*3/uL — ABNORMAL LOW (ref 150–400)
RBC: 3.42 MIL/uL — ABNORMAL LOW (ref 3.87–5.11)
RDW: 17.3 % — ABNORMAL HIGH (ref 11.5–15.5)
WBC: 7.1 10*3/uL (ref 4.0–10.5)
nRBC: 0 % (ref 0.0–0.2)

## 2018-10-20 LAB — GLUCOSE, RANDOM: Glucose, Bld: 77 mg/dL (ref 70–99)

## 2018-10-20 SURGERY — COLONOSCOPY WITH PROPOFOL
Anesthesia: Monitor Anesthesia Care

## 2018-10-20 MED ORDER — PROPOFOL 500 MG/50ML IV EMUL
INTRAVENOUS | Status: DC | PRN
  Administered 2018-10-20: 100 ug/kg/min via INTRAVENOUS

## 2018-10-20 MED ORDER — DEXTROSE 50 % IV SOLN
INTRAVENOUS | Status: AC
  Administered 2018-10-20: 50 mL via INTRAVENOUS
  Filled 2018-10-20: qty 50

## 2018-10-20 MED ORDER — CHLORHEXIDINE GLUCONATE CLOTH 2 % EX PADS
6.0000 | MEDICATED_PAD | Freq: Every day | CUTANEOUS | Status: DC
  Administered 2018-10-22 – 2018-10-26 (×3): 6 via TOPICAL

## 2018-10-20 MED ORDER — DEXTROSE 50 % IV SOLN
1.0000 | Freq: Once | INTRAVENOUS | Status: AC
  Administered 2018-10-20: 50 mL via INTRAVENOUS
  Filled 2018-10-20: qty 50

## 2018-10-20 MED ORDER — PROPOFOL 10 MG/ML IV BOLUS
INTRAVENOUS | Status: DC | PRN
  Administered 2018-10-20: 20 mg via INTRAVENOUS

## 2018-10-20 MED ORDER — DEXTROSE 50 % IV SOLN
25.0000 g | Freq: Once | INTRAVENOUS | Status: AC
  Administered 2018-10-20: 25 g via INTRAVENOUS
  Filled 2018-10-20: qty 50

## 2018-10-20 MED ORDER — LIDOCAINE 2% (20 MG/ML) 5 ML SYRINGE
INTRAMUSCULAR | Status: DC | PRN
  Administered 2018-10-20: 40 mg via INTRAVENOUS

## 2018-10-20 MED ORDER — PHENYLEPHRINE 40 MCG/ML (10ML) SYRINGE FOR IV PUSH (FOR BLOOD PRESSURE SUPPORT)
PREFILLED_SYRINGE | INTRAVENOUS | Status: DC | PRN
  Administered 2018-10-20 (×3): 80 ug via INTRAVENOUS
  Administered 2018-10-20: 100 ug via INTRAVENOUS

## 2018-10-20 SURGICAL SUPPLY — 22 items

## 2018-10-20 NOTE — Op Note (Signed)
[facility] Patient Name: Jessica Clarke Procedure Date : 10/20/2018 MRN: [id number] Attending MD: Thornton Park MD, MD Date of Birth: 14-Jun-1950 CSN: [id number] Age: 68 Admit Type: Inpatient Procedure:                Colonoscopy Indications:              Clinically significant diarrhea of unexplained                            origin, Rectal bleeding                           GI pathogen panel and hemoccult negative                           Colonoscopy with Dr. Deatra Ina 2012: tubular adenoma,                            diverticulosis, ascending colon lipoma Providers:                Thornton Park MD, MD, Glori Bickers, RN, Laverda Sorenson, Technician, Clearnce Sorrel, CRNA Referring MD:              Medicines:                See the Anesthesia note for documentation of the                            administered medications Complications:            No immediate complications. Estimated blood loss:                            Minimal. Estimated Blood Loss:     Estimated blood loss was minimal. Procedure:                Pre-Anesthesia Assessment:                           - Prior to the procedure, a History and Physical                            was performed, and patient medications and                            allergies were reviewed. The patient's tolerance of                            previous anesthesia was also reviewed. The risks                            and benefits of the procedure and the sedation                            options and risks were discussed with the patient.  All questions were answered, and informed consent                            was obtained. Prior Anticoagulants: The patient has                            taken no previous anticoagulant or antiplatelet                            agents. ASA Grade Assessment: III - A patient with                            severe systemic disease.  After reviewing the risks                            and benefits, the patient was deemed in                            satisfactory condition to undergo the procedure.                           After obtaining informed consent, the colonoscope                            was passed under direct vision. Throughout the                            procedure, the patient's blood pressure, pulse, and                            oxygen saturations were monitored continuously. The                            CF-HQ190L ([id number]) Olympus colonoscope was                            introduced through the anus and advanced to the the                            cecum, identified by appendiceal orifice and                            ileocecal valve. A second forward view of the right                            colon was performed. The colonoscopy was performed                            without difficulty. The patient tolerated the                            procedure well. The quality of the bowel  preparation was good. The ileocecal valve,                            appendiceal orifice, and rectum were photographed. Scope In: 8:59:56 AM Scope Out: 9:15:33 AM Scope Withdrawal Time: 0 hours 12 minutes 40 seconds  Total Procedure Duration: 0 hours 15 minutes 37 seconds  Findings:      A few diverticula were found in the sigmoid colon, descending colon and       ascending colon. No evidence for diverticulitis.      Two flat polyps were found in the descending colon and cecum. The polyps       were 2 mm in size. These polyps were removed with a cold snare.       Resection and retrieval were complete. Estimated blood loss was minimal.      Pathy areas of mildly erythematous, friable (with contact bleeding) and       granular mucosa were found scattered throughout the colon. They most       extensive area was located in the sigmoid colon. Biopsies were taken       from the right and  left colon with a cold forceps for histology.       Estimated blood loss was minimal.      There was a medium-sized lipoma, in the ascending colon.      The exam was otherwise without abnormality on direct and retroflexion       views. Impression:               - Diverticulosis in the sigmoid colon, in the                            descending colon and in the ascending colon.                           - Two 2 to 3 mm polyps in the descending colon and                            in the cecum, removed with a cold snare. Resected                            and retrieved.                           - Pathces of very mild erythematous, friable (with                            contact bleeding) and granular mucosa were seen                            throughout examined colon. Biopsied. This is of                            unclear clinical significance and has even been                            seen related to the prep for the colonoscopy.                           -  The examination was otherwise normal on direct                            and retroflexion views. Recommendation:           - Return patient to hospital ward for ongoing care.                           - Advance diet as tolerated today. High fiber diet                            recommended.                           - Continue present medications.                           - Await pathology results.                           - Consider surveillance colonoscopy in 7 years if                            at least one of the polyps is an adenoma. Procedure Code(s):        --- Professional ---                           [phone], Colonoscopy, flexible; with removal of                            tumor(s), polyp(s), or other lesion(s) by snare                            technique                           [id number], 57, Colonoscopy, flexible; with biopsy,                            single or multiple Diagnosis Code(s):        ---  Professional ---                           K63.5, Polyp of colon                           K92.2, Gastrointestinal hemorrhage, unspecified                           K63.89, Other specified diseases of intestine                           R19.7, Diarrhea, unspecified                           K62.5, Hemorrhage of anus and rectum  K57.30, Diverticulosis of large intestine without                            perforation or abscess without bleeding CPT copyright 2019 American Medical Association. All rights reserved. The codes documented in this report are preliminary and upon coder review may  be revised to meet current compliance requirements. Thornton Park MD, MD 10/20/2018 9:42:22 AM This report has been signed electronically. Number of Addenda: 0

## 2018-10-20 NOTE — Progress Notes (Signed)
PROGRESS NOTE  Jessica Clarke VZD:[id number] DOB: 1950-08-25 DOA: 10/17/2018 PCP: Glendale Chard, MD  HPI/Recap of past 24 hours: HPI from Dr Maudie Mercury [name]  is a 68 y.o. female,  w lung cancer, hypertension, hyperlipidemia, CAD, CHF, ESRD on HD (MWF), Anemia , hx of diverticulosis/ adenomatous polyp in colonoscopy on 05/24/2010. On 8/17, presents to the ED with c/o diarrhea for the past few weeks as well as rectal bleeding, both intermittent, found to have colitis on CT, discharged on p.o. Augmentin.  Pt presented to ER for similar complaints, noted black stools, with also some frank blood in his stools, with diarrhea.  Patient left the lobby, went to try to get something to eat at [facility] and was found on floor.  Reported LOC, hitting her head, sustained a laceration above right eyebrow.  In the ED, patient noted to have a hemoglobin of 6.6, hypokalemia 2.8.  Patient was transfused.  Hospitalist called to admit patient.   Today, patient noted to be somewhat lethargic.  Denies any new complaints.   Assessment/Plan: Principal Problem:   Symptomatic anemia Active Problems:   Hypertension   Smoker   ESRD on dialysis [facility])   Diarrhea   Rectal bleeding   Polyp of colon  Symptomatic anemia likely 2/2 lower GI bleed Hemoglobin noted to be 6.6 on admission Status post 3 units of packed RBC, remained stable CT showed possible ascending colitis Colonoscopy done in 2012 showed diverticulosis, tubular adenoma GI consulted, colonoscopy done on 10/20/2018 showed diverticulosis in the sigmoid, descending and ascending colon.  2 polyps in the descending colon and cecum, pending pathology report Continue iron supplementation Monitor CBC closely  Possible colitis Currently afebrile, with no leukocytosis BC x2 NGTD GI panel pending C. difficile positive for antigen, but negative for toxin, spoke to ID Dr Baxter Flattery on 10/20/18, likely colonization, no need to treat (except if worsening diarrhea, fever,  significantly elevated WBC, sepsis etc) CT scan showed possible ascending colitis Due to no evidence of colitis seen on colonoscopy, with possible colonization of C. difficile will DC IV Zosyn GI consulted, appreciate recs  Syncope Likely 2/2 symptomatic anemia, rule out cardiac causes EKG with no acute ST changes Echo with EF of 60 to 65%, left ventricular diastolic Doppler parameters are consistent with impaired relaxation CT head no acute intracranial abnormality Telemetry  Hypoglycemia Ongoing Likely due to poor oral intake versus CLD status CBG every 4 hours Hypoglycemic protocol  Mild transaminitis CT abdomen/pelvis without any hepatobiliary abnormality ??metastatic disease to bone Daily CMP, will trend  Hypokalemia Replace PRN  ESRD HD on Monday/Wednesday/Friday Nephrology on board  Hypertension/hyperlipidemia/CAD Continue atenolol, Lipitor Hold ASA due to rectal bleeding  Gout Continue allopurinol  Hx of scleroderma Very dry skin Eucerin cream for moisturizing   ??Lung cancer, spiculated nodule in the right upper lobe Spoke with Dr. Earlie Server, recommending contacting CTS to evaluate patient for surgical candidate, he will follow up with patient as an outpatient CTS consulted, not a surgical candidate for resection of lung CA  Tobacco abuse Advised to quit Nicotine patch           Malnutrition Type:      Malnutrition Characteristics:      Nutrition Interventions:       Estimated body mass index is 22.14 kg/m as calculated from the following:   Height as of this encounter: 5\' 11"  (1.803 m).   Weight as of this encounter: 72 kg.     Code Status: Full  Family Communication: None at  bedside  Disposition Plan: To be determined   Consultants:  Nephrology  GI  Cardiothoracic surgeon  Procedures:  Colonoscopy on 10/20/2018  Antimicrobials:  Zosyn discontinued on 10/20/2018  DVT prophylaxis: SCDs   Objective: Vitals:    10/20/18 0930 10/20/18 0945 10/20/18 1000 10/20/18 1015  BP: (!) 111/91 125/89 100/68 116/70  Pulse: 62 67 65 61  Resp: (!) 26 (!) 22 (!) 29 (!) 21  Temp: (!) 97 F (36.1 C)   98.2 F (36.8 C)  TempSrc:      SpO2: 96% 94% 100% 100%  Weight:      Height:        Intake/Output Summary (Last 24 hours) at 10/20/2018 1052 Last data filed at 10/20/2018 0300 Gross per 24 hour  Intake 886.92 ml  Output -  Net 886.92 ml   Filed Weights   10/19/18 0500 10/20/18 0500 10/20/18 0829  Weight: 71.1 kg 72.2 kg 72 kg    Exam:  General: NAD   Cardiovascular: S1, S2 present  Respiratory:  Diminished breath sounds bilaterally  Abdomen: Soft, nontender, nondistended, bowel sounds present  Musculoskeletal: Trace nonpitting bilateral pedal edema noted, chronic venous skin changes noted  Skin:  Very dry skin  Psychiatry: Normal mood   Data Reviewed: CBC: Recent Labs  Lab 10/17/18 1751 10/18/18 0500 10/18/18 1750 10/19/18 0550 10/20/18 0536  WBC 4.5 5.3 6.0 5.6 7.1  NEUTROABS  --   --   --  4.2 4.7  HGB 6.6* 10.1* 10.7* 10.1* 11.0*  HCT 21.6* 30.8* 31.4* 31.1* 32.9*  MCV 105.9* 98.4 95.4 98.7 96.2  PLT 145* 127* 127* 98* 98*   Basic Metabolic Panel: Recent Labs  Lab 10/17/18 1751 10/18/18 0500 10/18/18 1126 10/18/18 1750 10/18/18 2352 10/19/18 0550 10/20/18 0536  NA 135 133*  --  131*  --  134* 132*  K 2.8* 2.8*  --  3.7  --  3.3* 4.4  CL 94* 94*  --  94*  --  96* 98  CO2 28 29  --  25  --  26 20*  GLUCOSE 95 77 156* 79 150* 108* 85  BUN 16 17  --  18  --  9 15  CREATININE 4.44* 4.63*  --  5.10*  --  3.37* 4.21*  CALCIUM 8.7* 8.8*  --  8.7*  --  8.8* 8.2*  MG  --  1.8  --   --   --   --   --   PHOS  --   --   --  4.1  --   --   --    GFR: Estimated Creatinine Clearance: 14.1 mL/min (A) (by C-G formula based on SCr of 4.21 mg/dL (H)). Liver Function Tests: Recent Labs  Lab 10/17/18 1751 10/18/18 0500 10/18/18 1750 10/19/18 0550 10/20/18 0536  AST 21 22  --   22 QUANTITY NOT SUFFICIENT, UNABLE TO PERFORM TEST  ALT 10 11  --  10 QUANTITY NOT SUFFICIENT, UNABLE TO PERFORM TEST  ALKPHOS 141* 130*  --  140* 132*  BILITOT 1.1 1.8*  --  1.6* QUANTITY NOT SUFFICIENT, UNABLE TO PERFORM TEST  PROT 7.4 6.7  --  7.0 6.2*  ALBUMIN 2.5* 2.4* 2.4* 2.3* 2.1*   No results for input(s): LIPASE, AMYLASE in the last 168 hours. No results for input(s): AMMONIA in the last 168 hours. Coagulation Profile: No results for input(s): INR, PROTIME in the last 168 hours. Cardiac Enzymes: No results for input(s): CKTOTAL, CKMB, CKMBINDEX, TROPONINI in the last  168 hours. BNP (last 3 results) No results for input(s): PROBNP in the last 8760 hours. HbA1C: No results for input(s): HGBA1C in the last 72 hours. CBG: Recent Labs  Lab 10/19/18 2218 10/19/18 2337 10/20/18 0159 10/20/18 0349 10/20/18 0718  GLUCAP 51* 75 90 90 128*   Lipid Profile: Recent Labs    10/19/18 0550  CHOL 69  HDL 20*  LDLCALC 41  TRIG 42  CHOLHDL 3.5   Thyroid Function Tests: No results for input(s): TSH, T4TOTAL, FREET4, T3FREE, THYROIDAB in the last 72 hours. Anemia Panel: No results for input(s): VITAMINB12, FOLATE, FERRITIN, TIBC, IRON, RETICCTPCT in the last 72 hours. Urine analysis:    Component Value Date/Time   COLORURINE YELLOW 08/27/2013 0546   APPEARANCEUR CLOUDY (A) 08/27/2013 0546   LABSPEC 1.026 08/27/2013 0546   PHURINE 7.5 08/27/2013 0546   GLUCOSEU NEGATIVE 08/27/2013 0546   HGBUR TRACE (A) 08/27/2013 0546   BILIRUBINUR SMALL (A) 08/27/2013 0546   KETONESUR NEGATIVE 08/27/2013 0546   PROTEINUR >300 (A) 08/27/2013 0546   UROBILINOGEN 0.2 08/27/2013 0546   NITRITE NEGATIVE 08/27/2013 0546   LEUKOCYTESUR NEGATIVE 08/27/2013 0546   Sepsis Labs: @LABRCNTIP (procalcitonin:4,lacticidven:4)  ) Recent Results (from the past 240 hour(s))  SARS CORONAVIRUS 2 (TAT 6-24 HRS) Nasopharyngeal Nasopharyngeal Swab     Status: None   Collection Time: 10/18/18 12:05 AM    Specimen: Nasopharyngeal Swab  Result Value Ref Range Status   SARS Coronavirus 2 NEGATIVE NEGATIVE Final    Comment: (NOTE) SARS-CoV-2 target nucleic acids are NOT DETECTED. The SARS-CoV-2 RNA is generally detectable in upper and lower respiratory specimens during the acute phase of infection. Negative results do not preclude SARS-CoV-2 infection, do not rule out co-infections with other pathogens, and should not be used as the sole basis for treatment or other patient management decisions. Negative results must be combined with clinical observations, patient history, and epidemiological information. The expected result is Negative. Fact Sheet for Patients: SugarRoll.be Fact Sheet for Healthcare Providers: https://www.woods-mathews.com/ This test is not yet approved or cleared by the [place] FDA and  has been authorized for detection and/or diagnosis of SARS-CoV-2 by FDA under an Emergency Use Authorization (EUA). This EUA will remain  in effect (meaning this test can be used) for the duration of the COVID-19 declaration under Section 56 4(b)(1) of the Act, 21 U.S.C. section 360bbb-3(b)(1), unless the authorization is terminated or revoked sooner. Performed at [facility], [place] [address] [place], [place] [address]   Culture, blood (routine x 2)     Status: None (Preliminary result)   Collection Time: 10/18/18 12:10 PM   Specimen: BLOOD RIGHT HAND  Result Value Ref Range Status   Specimen Description BLOOD RIGHT HAND  Final   Special Requests   Final    BOTTLES DRAWN AEROBIC ONLY Blood Culture adequate volume   Culture   Final    NO GROWTH 2 DAYS Performed at [facility], [place] [address], [place], [place] [address]    Report Status PENDING  Incomplete  C Difficile Quick Screen w PCR reflex     Status: Abnormal   Collection Time: 10/19/18  4:05 PM   Specimen: Stool  Result Value Ref Range Status   C Diff antigen  POSITIVE (A) NEGATIVE Final   C Diff toxin NEGATIVE NEGATIVE Final   C Diff interpretation Results are indeterminate. See PCR results.  Final    Comment: Performed at [facility], [place] [address], [place], [place]  [place] Diff by PCR, Reflexed     Status: None   Collection Time: 10/19/18  4:05 PM  Result Value Ref Range Status   Toxigenic C. Difficile by PCR NEGATIVE NEGATIVE Final    Comment: Patient is colonized with non toxigenic C. difficile. May not need treatment unless significant symptoms are present. Performed at [facility], [place] [address], [place], [place] [address]       Studies: No results found.  Scheduled Meds: . allopurinol  100 mg Oral Daily  . atenolol  100 mg Oral Daily  . atorvastatin  10 mg Oral QHS  . calcitRIOL  1 mcg Oral Q M,W,F-HD  . Chlorhexidine Gluconate Cloth  6 each Topical Q0600  . cinacalcet  30 mg Oral Q supper  . [START ON 10/25/2018] darbepoetin (ARANESP) injection - DIALYSIS  60 mcg Intravenous Q Fri-HD  . feeding supplement (PRO-STAT SUGAR FREE 64)  30 mL Oral BID  . hydrocerin   Topical BID  . multivitamin  1 tablet Oral QHS  . sodium chloride flush  3 mL Intravenous Q12H  . sucroferric oxyhydroxide  1,000 mg Oral TID WC  . thiamine  100 mg Oral Daily    Continuous Infusions: . sodium chloride    . piperacillin-tazobactam (ZOSYN)  IV 3.375 g (10/19/18 2246)     LOS: 2 days     Alma Friendly, MD [facility]  If 7PM-7AM, please contact night-coverage [email or web address] 10/20/2018, 10:52 AM

## 2018-10-20 NOTE — Progress Notes (Signed)
[facility] Progress Note  Subjective: no specific c/o's.  Eating lunch.   Vitals:   10/20/18 0945 10/20/18 1000 10/20/18 1015 10/20/18 1200  BP: 125/89 100/68 116/70 113/70  Pulse: 67 65 61 61  Resp: (!) 22 (!) 29 (!) 21   Temp:   98.2 F (36.8 C) (!) 97.4 F (36.3 C)  TempSrc:    Axillary  SpO2: 94% 100% 100%   Weight:      Height:        Inpatient medications: . allopurinol  100 mg Oral Daily  . atenolol  100 mg Oral Daily  . atorvastatin  10 mg Oral QHS  . calcitRIOL  1 mcg Oral Q M,W,F-HD  . Chlorhexidine Gluconate Cloth  6 each Topical Q0600  . cinacalcet  30 mg Oral Q supper  . [START ON 10/25/2018] darbepoetin (ARANESP) injection - DIALYSIS  60 mcg Intravenous Q Fri-HD  . dextrose  25 g Intravenous Once  . feeding supplement (PRO-STAT SUGAR FREE 64)  30 mL Oral BID  . hydrocerin   Topical BID  . multivitamin  1 tablet Oral QHS  . sodium chloride flush  3 mL Intravenous Q12H  . sucroferric oxyhydroxide  1,000 mg Oral TID WC  . thiamine  100 mg Oral Daily   . sodium chloride     sodium chloride, acetaminophen **OR** acetaminophen, nicotine, sodium chloride flush    Exam: General: Chronically ill appearing elderly female in no acute distress Neck: Supple. JVD elevated 1/4 to mandible. Lungs: Bilateral breath sounds with mild bibasilar rales Heart: RRR with S1 S2. No murmurs, rubs, or gallops appreciated. Abdomen: Soft, non-tender, non-distended with normoactive bowel sounds. No rebound/guarding. No obvious abdominal masses. M-S:  Strength and tone appear normal for age. Lower extremities: Trace-1+ BLE edema.  Neuro: Alert and oriented X 3/. Moves all extremities spontaneously. Dialysis Access: L AVF + bruit  [facility]: [facility] 4h  425/ A1.5  69kg  3K/2Ca bath  P4  LUA AVF  Heparin 4000 (DC upon discharge, please!) -Mircera 60 mcg IV q 2 weeks (last HGB 6.4 10/16/18 Last ESA 10/04/18) -Calcitriol 1.25 mcg PO TIW  Assessment/Plan: 1.   Symptomatic Anemia/Rectal Bleeding: HGB 6.6 on adm. Has rec'd 2 units PRBCs. HGB now 11. GI consulted. Hx diverticulosis/adenomatous polyp. DC heparin with HD for now. For colonoscopy today by GI.  2. Possible Colitis-per primary. On Zosyn.  3. Volume Overload - edema/JVD on admit. 3.5L off on 9/4 , still up 2-3kg.  4. Hypokalemia- rec'd po Kdur but K+ down 3.3 5.  ESRD - MWF via AVF. Has not missed HD. HD tomorrow.  6.  Hypertension/volume  -BP stable, volume as above, cont to lower next hd 7.  Anemia  - As noted above. Give Aranesp 60 mcg with HD today (ESA due today). Follow HGB. Last OP Tsat 28% 27/06/23.  8.  Metabolic bone disease - Ca 8.8  C Ca 10.4 Continue binders, sensipar, reduce calcitriol dose to 1.0 mcg PO TIW.  9.  Nutrition - Albumin 2.4 Renal Diet, prostat, renal vits.  10. H/O Spiculated nodule RUL-follows with Dr. Earlie Server. Per primary 11. Mild Transaminitis-per primary.     [facility] 10/20/2018, 4:27 PM  Iron/TIBC/Ferritin/ %Sat    Component Value Date/Time   IRON 73 07/22/2013 1330   TIBC 201 (L) 07/22/2013 1330   FERRITIN 525 (H) 07/22/2013 1330   IRONPCTSAT 36 07/22/2013 1330   Recent Labs  Lab 10/18/18 1750  10/20/18 0536 10/20/18 1256  NA 131*   < > 132*  --   K 3.7   < > 4.4  --   CL 94*   < > 98  --   CO2 25   < > 20*  --   GLUCOSE 79   < > 85 77  BUN 18   < > 15  --   CREATININE 5.10*   < > 4.21*  --   CALCIUM 8.7*   < > 8.2*  --   PHOS 4.1  --   --   --   ALBUMIN 2.4*   < > 2.1*  --    < > = values in this interval not displayed.   Recent Labs  Lab 10/20/18 0536  AST QUANTITY NOT SUFFICIENT, UNABLE TO PERFORM TEST  ALT QUANTITY NOT SUFFICIENT, UNABLE TO PERFORM TEST  ALKPHOS 132*  BILITOT QUANTITY NOT SUFFICIENT, UNABLE TO PERFORM TEST  PROT 6.2*   Recent Labs  Lab 10/20/18 0536  WBC 7.1  HGB 11.0*  HCT 32.9*  PLT 98*

## 2018-10-20 NOTE — Progress Notes (Signed)
PT Cancellation Note  Patient Details Name: Jessica Clarke MRN: [id number] DOB: 09/13/1950   Cancelled Treatment:    Reason Eval/Treat Not Completed: Patient declined, no reason specified Patient declined partiicpating in therapy at this time due to fatigue and diarrhea. Pt had colonoscopy this am. PT will continue to follow acutely and progress as tolerated. Pt has HD tomorrow 9/7.   Earney Navy, PTA Acute Rehabilitation Services Pager: [phone] Office: [phone]   10/20/2018, 3:23 PM

## 2018-10-20 NOTE — Transfer of Care (Signed)
Immediate Anesthesia Transfer of Care Note  Patient: Jessica Clarke  Procedure(s) Performed: COLONOSCOPY WITH PROPOFOL (N/A ) BIOPSY POLYPECTOMY  Patient Location: PACU  Anesthesia Type:MAC  Level of Consciousness: responds to stimulation  Airway & Oxygen Therapy: Patient Spontanous Breathing and Patient connected to face mask oxygen  Post-op Assessment: Report given to RN and Post -op Vital signs reviewed and stable  Post vital signs: Reviewed and stable  Last Vitals:  Vitals Value Taken Time  BP 111/91 10/20/18 0929  Temp    Pulse 60 10/20/18 0932  Resp 18 10/20/18 0932  SpO2 97 % 10/20/18 0932  Vitals shown include unvalidated device data.  Last Pain:  Vitals:   10/20/18 0829  TempSrc: Temporal  PainSc: 0-No pain         Complications: No apparent anesthesia complications

## 2018-10-20 NOTE — Anesthesia Postprocedure Evaluation (Signed)
Anesthesia Post Note  Patient: Jessica Clarke  Procedure(s) Performed: COLONOSCOPY WITH PROPOFOL (N/A ) BIOPSY POLYPECTOMY     Patient location during evaluation: PACU Anesthesia Type: MAC Level of consciousness: awake and alert Pain management: pain level controlled Vital Signs Assessment: post-procedure vital signs reviewed and stable Respiratory status: spontaneous breathing, nonlabored ventilation and respiratory function stable Cardiovascular status: stable and blood pressure returned to baseline Postop Assessment: no apparent nausea or vomiting Anesthetic complications: no    Last Vitals:  Vitals:   10/20/18 1000 10/20/18 1015  BP: 100/68 116/70  Pulse: 65 61  Resp: (!) 29 (!) 21  Temp:  36.8 C  SpO2: 100% 100%    Last Pain:  Vitals:   10/20/18 1000  TempSrc:   PainSc: Asleep                 Lanika Colgate,W. EDMOND

## 2018-10-20 NOTE — ED Provider Notes (Signed)
[facility] PROGRESSIVE CARE Provider Note   CSN: [id number] Arrival date & time: 10/17/18  1728     History   Chief Complaint Chief Complaint  Patient presents with  . Rectal Bleeding  . Diarrhea    HPI Manya Balash is a 68 y.o. female.     HPI Patient reports he has been having dark stools for 2 weeks.  She had been seen in the emergency department [phone] and diagnosed with colitis.  She has been taking Augmentin.  She reports however she is having bloody stool and increased general weakness.  She reports she is having continued diarrheal bowel movements and sometimes incontinence.  She is not on anticoagulants.  She denies any pain. Past Medical History:  Diagnosis Date  . Abnormal Pap smear    ASCUS ?HGSIL  . Anemia   . Cervical dysplasia   . CHF (congestive heart failure) ([place])   . Chronic kidney disease   . Constipation   . Constipation   . GERD (gastroesophageal reflux disease)   . Gout   . Hyperlipidemia   . Hypertension   . Myocardial infarction ([place]) 2015  . Obesity   . Pneumonia   . Uterine polyp     Patient Active Problem List   Diagnosis Date Noted  . Polyp of colon   . Diarrhea 10/18/2018  . Rectal bleeding 10/18/2018  . Symptomatic anemia 10/17/2018  . Lung nodule < 6cm on CT 03/05/2018  . Malnutrition of moderate degree 08/28/2016  . Respiratory distress 08/26/2016  . ESRD on dialysis ([place]) 06/08/2015  . CAD- non obstructive RI disease 08/26/13 08/27/2013  . NSTEMI-08/25/13- non obstructive CAD at cath 08/26/13 08/26/2013  . Chronic combined systolic and diastolic heart failure ([place]) 08/26/2013  . Alcohol abuse 08/25/2013  . Respiratory failure with hypoxia and hypercapnia ([place]) 08/25/2013  . Flash pulmonary edema ([place]) 08/25/2013  . Encounter for adequacy testing for hemodialysis ([place]) 06/26/2012  . End stage renal disease ([place]) 12/27/2011  . ASCUS (atypical squamous cells of undetermined significance) on Pap smear 09/09/2010  .  Hypertension 09/09/2010  . Cervical polyp 09/09/2010  . Smoker 09/09/2010  . Post-menopausal bleeding 09/09/2010  . Gout 09/09/2010    Past Surgical History:  Procedure Laterality Date  . AV FISTULA PLACEMENT  01/18/2012   Procedure: ARTERIOVENOUS (AV) FISTULA CREATION;  Surgeon: Angelia Mould, MD;  Location: [facility];  Service: Vascular;  Laterality: Left;  . DILATION AND CURETTAGE OF UTERUS    . HYSTEROSCOPY    . LEEP    . LEFT HEART CATHETERIZATION WITH CORONARY ANGIOGRAM N/A 08/26/2013   Procedure: LEFT HEART CATHETERIZATION WITH CORONARY ANGIOGRAM;  Surgeon: Leonie Man, MD;  Location[facility] CATH LAB;  Service: Cardiovascular;  Laterality: N/A;  . REVISON OF ARTERIOVENOUS FISTULA Left 9/56/2130   Procedure: PLICATION OF ARTERIOVENOUS FISTULA;  Surgeon: Conrad [place], MD;  Location: [facility] OR;  Service: Vascular;  Laterality: Left;     OB History    Gravida  2   Para  1   Term      Preterm      AB  1   Living  1     SAB      TAB  1   Ectopic      Multiple      Live Births               Home Medications    Prior to Admission medications   Medication Sig Start Date End Date  Taking? Authorizing Provider  acetaminophen (TYLENOL) 325 MG tablet Take 325-650 mg by mouth every 8 (eight) hours as needed for mild pain.    Yes [provider]  allopurinol (ZYLOPRIM) 100 MG tablet Take 100 mg by mouth daily.     Yes [provider]  aspirin EC 325 MG tablet Take 1 tablet (325 mg total) by mouth daily. 08/27/13  Yes Thurnell Lose, MD  atenolol (TENORMIN) 100 MG tablet Take 1 tablet (100 mg total) by mouth daily. 08/27/13  Yes Thurnell Lose, MD  atorvastatin (LIPITOR) 10 MG tablet Take 10 mg by mouth at bedtime.  01/30/16  Yes [provider]  colchicine 0.6 MG tablet Take 0.6 mg by mouth daily as needed (as directed for gout flares or pain).  08/14/18  Yes [provider]  VELPHORO 500 MG chewable tablet Chew 2 tablets by mouth  3 (three) times daily with meals.  06/30/16  Yes [provider]  amoxicillin-clavulanate (AUGMENTIN) 500-125 MG tablet Take 1 tablet (500 mg total) by mouth See admin instructions. Take one daily. On days of dialysis take an additional dose during and after dialysis. Patient not taking: Reported on 10/17/2018 09/30/18   Mesner, Corene Cornea, MD  thiamine (VITAMIN B-1) 100 MG tablet Take 100 mg by mouth daily.    [provider]    Family History Family History  Problem Relation Age of Onset  . Esophageal cancer Other   . Pancreatic cancer Other   . Heart disease Mother   . Hypertension Mother   . Breast cancer Sister   . Esophageal cancer Sister   . Cancer Sister   . Deep vein thrombosis Sister   . Diabetes Sister   . Hypertension Sister   . Hypertension Daughter   . Colon cancer Neg Hx     Social History Social History   Tobacco Use  . Smoking status: Current Every Day Smoker    Packs/day: 0.50    Years: 30.00    Pack years: 15.00    Types: Cigarettes  . Smokeless tobacco: Never Used  Substance Use Topics  . Alcohol use: Yes    Alcohol/week: 6.0 standard drinks    Types: 6 Cans of beer per week  . Drug use: No     Allergies   Patient has no known allergies.   Review of Systems Review of Systems 10 Systems reviewed and are negative for acute change except as noted in the HPI.   Physical Exam Updated Vital Signs BP 113/70 (BP Location: Right Arm)   Pulse 61   Temp (!) 97.4 F (36.3 C) (Axillary)   Resp (!) 21   Ht _0  (1.803 m)   Wt 72 kg   SpO2 100%   BMI 22.14 kg/m   Physical Exam Constitutional:      Comments: Patient is alert and nontoxic.  No respiratory distress.  Very pale in appearance.  HENT:     Head: Normocephalic and atraumatic.     Mouth/Throat:     Pharynx: Oropharynx is clear.  Cardiovascular:     Rate and Rhythm: Normal rate and regular rhythm.  Pulmonary:     Effort: Pulmonary effort is normal.     Breath sounds:  Normal breath sounds.  Abdominal:     General: There is no distension.     Palpations: Abdomen is soft.     Tenderness: There is no abdominal tenderness. There is no guarding.  Genitourinary:    Comments: Only very trace  amount of stool in the vault.  No red blood or melena. Musculoskeletal: Normal range of motion.  Skin:    General: Skin is warm and dry.     Coloration: Skin is pale.  Neurological:     General: No focal deficit present.     Mental Status: She is oriented to person, place, and time.     Coordination: Coordination normal.  Psychiatric:        Mood and Affect: Mood normal.      ED Treatments / Results  Labs (all labs ordered are listed, but only abnormal results are displayed) Labs Reviewed  GASTROINTESTINAL PANEL BY PCR, STOOL (REPLACES STOOL CULTURE) - Abnormal; Notable for the following components:      Result Value   Enteropathogenic E coli (EPEC) DETECTED (*)    All other components within normal limits  C DIFFICILE QUICK SCREEN W PCR REFLEX - Abnormal; Notable for the following components:   C Diff antigen POSITIVE (*)    All other components within normal limits  COMPREHENSIVE METABOLIC PANEL - Abnormal; Notable for the following components:   Potassium 2.8 (*)    Chloride 94 (*)    Creatinine, Ser 4.44 (*)    Calcium 8.7 (*)    Albumin 2.5 (*)    Alkaline Phosphatase 141 (*)    GFR calc non Af Amer 9 (*)    GFR calc Af Amer 11 (*)    All other components within normal limits  CBC - Abnormal; Notable for the following components:   RBC 2.04 (*)    Hemoglobin 6.6 (*)    HCT 21.6 (*)    MCV 105.9 (*)    RDW 18.6 (*)    Platelets 145 (*)    nRBC 0.7 (*)    All other components within normal limits  COMPREHENSIVE METABOLIC PANEL - Abnormal; Notable for the following components:   Sodium 133 (*)    Potassium 2.8 (*)    Chloride 94 (*)    Creatinine, Ser 4.63 (*)    Calcium 8.8 (*)    Albumin 2.4 (*)    Alkaline Phosphatase 130 (*)    Total  Bilirubin 1.8 (*)    GFR calc non Af Amer 9 (*)    GFR calc Af Amer 10 (*)    All other components within normal limits  CBC - Abnormal; Notable for the following components:   RBC 3.13 (*)    Hemoglobin 10.1 (*)    HCT 30.8 (*)    RDW 18.3 (*)    Platelets 127 (*)    All other components within normal limits  GLUCOSE, CAPILLARY - Abnormal; Notable for the following components:   Glucose-Capillary 31 (*)    All other components within normal limits  GLUCOSE, CAPILLARY - Abnormal; Notable for the following components:   Glucose-Capillary 30 (*)    All other components within normal limits  GLUCOSE, RANDOM - Abnormal; Notable for the following components:   Glucose, Bld 156 (*)    All other components within normal limits  GLUCOSE, CAPILLARY - Abnormal; Notable for the following components:   Glucose-Capillary 63 (*)    All other components within normal limits  COMPREHENSIVE METABOLIC PANEL - Abnormal; Notable for the following components:   Sodium 134 (*)    Potassium 3.3 (*)    Chloride 96 (*)    Glucose, Bld 108 (*)    Creatinine, Ser 3.37 (*)    Calcium 8.8 (*)    Albumin 2.3 (*)  Alkaline Phosphatase 140 (*)    Total Bilirubin 1.6 (*)    GFR calc non Af Amer 13 (*)    GFR calc Af Amer 15 (*)    All other components within normal limits  CBC WITH DIFFERENTIAL/PLATELET - Abnormal; Notable for the following components:   RBC 3.15 (*)    Hemoglobin 10.1 (*)    HCT 31.1 (*)    RDW 18.3 (*)    Platelets 98 (*)    All other components within normal limits  RENAL FUNCTION PANEL - Abnormal; Notable for the following components:   Sodium 131 (*)    Chloride 94 (*)    Creatinine, Ser 5.10 (*)    Calcium 8.7 (*)    Albumin 2.4 (*)    GFR calc non Af Amer 8 (*)    GFR calc Af Amer 9 (*)    All other components within normal limits  CBC - Abnormal; Notable for the following components:   RBC 3.29 (*)    Hemoglobin 10.7 (*)    HCT 31.4 (*)    RDW 18.3 (*)    Platelets  127 (*)    All other components within normal limits  GLUCOSE, CAPILLARY - Abnormal; Notable for the following components:   Glucose-Capillary <10 (*)    All other components within normal limits  GLUCOSE, RANDOM - Abnormal; Notable for the following components:   Glucose, Bld 150 (*)    All other components within normal limits  GLUCOSE, CAPILLARY - Abnormal; Notable for the following components:   Glucose-Capillary <10 (*)    All other components within normal limits  GLUCOSE, CAPILLARY - Abnormal; Notable for the following components:   Glucose-Capillary 155 (*)    All other components within normal limits  LIPID PANEL - Abnormal; Notable for the following components:   HDL 20 (*)    All other components within normal limits  GLUCOSE, CAPILLARY - Abnormal; Notable for the following components:   Glucose-Capillary 34 (*)    All other components within normal limits  COMPREHENSIVE METABOLIC PANEL - Abnormal; Notable for the following components:   Sodium 132 (*)    CO2 20 (*)    Creatinine, Ser 4.21 (*)    Calcium 8.2 (*)    Total Protein 6.2 (*)    Albumin 2.1 (*)    Alkaline Phosphatase 132 (*)    GFR calc non Af Amer 10 (*)    GFR calc Af Amer 12 (*)    All other components within normal limits  CBC WITH DIFFERENTIAL/PLATELET - Abnormal; Notable for the following components:   RBC 3.42 (*)    Hemoglobin 11.0 (*)    HCT 32.9 (*)    RDW 17.3 (*)    Platelets 98 (*)    All other components within normal limits  GLUCOSE, CAPILLARY - Abnormal; Notable for the following components:   Glucose-Capillary 54 (*)    All other components within normal limits  GLUCOSE, CAPILLARY - Abnormal; Notable for the following components:   Glucose-Capillary 56 (*)    All other components within normal limits  GLUCOSE, CAPILLARY - Abnormal; Notable for the following components:   Glucose-Capillary <10 (*)    All other components within normal limits  GLUCOSE, CAPILLARY - Abnormal; Notable  for the following components:   Glucose-Capillary 117 (*)    All other components within normal limits  GLUCOSE, CAPILLARY - Abnormal; Notable for the following components:   Glucose-Capillary 51 (*)    All other components  within normal limits  GLUCOSE, CAPILLARY - Abnormal; Notable for the following components:   Glucose-Capillary 128 (*)    All other components within normal limits  GLUCOSE, CAPILLARY - Abnormal; Notable for the following components:   Glucose-Capillary <10 (*)    All other components within normal limits  GLUCOSE, CAPILLARY - Abnormal; Notable for the following components:   Glucose-Capillary <10 (*)    All other components within normal limits  GLUCOSE, CAPILLARY - Abnormal; Notable for the following components:   Glucose-Capillary <10 (*)    All other components within normal limits  SARS CORONAVIRUS 2 (TAT 6-24 HRS)  CULTURE, BLOOD (ROUTINE X 2)  CLOSTRIDIUM DIFFICILE BY PCR, REFLEXED  HIV ANTIBODY (ROUTINE TESTING W REFLEX)  MAGNESIUM  GLUCOSE, CAPILLARY  GLUCOSE, CAPILLARY  GLUCOSE, CAPILLARY  GLUCOSE, CAPILLARY  GLUCOSE, CAPILLARY  GLUCOSE, CAPILLARY  GLUCOSE, RANDOM  POC OCCULT BLOOD, ED  TYPE AND SCREEN  PREPARE RBC (CROSSMATCH)  SURGICAL PATHOLOGY    EKG EKG Interpretation  Date/Time:  Thursday October 17 2018 21:25:54 EDT Ventricular Rate:  66 PR Interval:    QRS Duration: 130 QT Interval:  470 QTC Calculation: 493 R Axis:   -51 Text Interpretation:  Sinus rhythm Prolonged PR interval Nonspecific IVCD with LAD Borderline T abnormalities, anterior leads When compared with ECG of 08/26/2016, Left ventricular hypertrophy is no longer present Confirmed by Delora Fuel ([id number]) on 10/17/2018 11:33:35 PM   Radiology Ct Head Wo Contrast  Result Date: 10/19/2018 CLINICAL DATA:  Fall, hit head EXAM: CT HEAD WITHOUT CONTRAST TECHNIQUE: Contiguous axial images were obtained from the base of the skull through the vertex without intravenous contrast.  COMPARISON:  None. FINDINGS: Brain: There is atrophy and chronic small vessel disease changes. No acute intracranial abnormality. Specifically, no hemorrhage, hydrocephalus, mass lesion, acute infarction, or significant intracranial injury. Vascular: No hyperdense vessel or unexpected calcification. Skull: No acute calvarial abnormality. Sinuses/Orbits: Visualized paranasal sinuses and mastoids clear. Orbital soft tissues unremarkable. Other: None IMPRESSION: Atrophy, chronic microvascular disease. No acute intracranial abnormality. Electronically Signed   By: Rolm Baptise M.D.   On: 10/19/2018 02:25    Procedures Procedures (including critical care time) CRITICAL CARE Performed by: Charlesetta Shanks   Total critical care time:30  minutes  Critical care time was exclusive of separately billable procedures and treating other patients.  Critical care was necessary to treat or prevent imminent or life-threatening deterioration.  Critical care was time spent personally by me on the following activities: development of treatment plan with patient and/or surrogate as well as nursing, discussions with consultants, evaluation of patient's response to treatment, examination of patient, obtaining history from patient or surrogate, ordering and performing treatments and interventions, ordering and review of laboratory studies, ordering and review of radiographic studies, pulse oximetry and re-evaluation of patient's condition. Medications Ordered in ED Medications  acetaminophen (TYLENOL) tablet 650 mg ( Oral MAR Unhold 10/20/18 1033)    Or  acetaminophen (TYLENOL) suppository 650 mg ( Rectal MAR Unhold 10/20/18 1033)  sodium chloride flush (NS) 0.9 % injection 3 mL ( Intravenous MAR Unhold 10/20/18 1033)  sodium chloride flush (NS) 0.9 % injection 3 mL ( Intravenous MAR Unhold 10/20/18 1033)  0.9 %  sodium chloride infusion ( Intravenous MAR Unhold 10/20/18 1033)  feeding supplement (PRO-STAT SUGAR FREE 64) liquid  30 mL ( Oral MAR Unhold 10/20/18 1033)  allopurinol (ZYLOPRIM) tablet 100 mg ( Oral MAR Unhold 10/20/18 1033)  atenolol (TENORMIN) tablet 100 mg ( Oral MAR Unhold 10/20/18 1033)  atorvastatin (  LIPITOR) tablet 10 mg ( Oral MAR Unhold 10/20/18 1033)  sucroferric oxyhydroxide (VELPHORO) chewable tablet 1,000 mg (1,000 mg Oral Given 10/20/18 1244)  thiamine (VITAMIN B-1) tablet 100 mg ( Oral MAR Un[place] 10/20/18 1033)  nicotine (NICODERM CQ - dosed in mg/24 hours) patch 14 mg ( Transdermal MAR Unhold 10/20/18 1033)  Chlorhexidine Gluconate Cloth 2 % PADS 6 each ( Topical MAR [place] 10/20/18 1033)  hydrocerin (EUCERIN) cream ( Topical MAR Unhold 10/20/18 1033)  Darbepoetin Alfa (ARANESP) injection 60 mcg ( Intravenous MAR Unhold 10/20/18 1033)  calcitRIOL (ROCALTROL) capsule 1 mcg ( Oral MAR Unhold 10/20/18 1033)  cinacalcet (SENSIPAR) tablet 30 mg ( Oral MAR Unhold 10/20/18 1033)  multivitamin (RENA-VIT) tablet 1 tablet ( Oral MAR Unhold 10/20/18 1033)  0.9 %  sodium chloride infusion (0 mL/hr Intravenous Stopped 10/18/18 0602)  potassium chloride SA (K-DUR) CR tablet 40 mEq (40 mEq Oral Given 10/18/18 1028)  glucose 4 GM chewable tablet (1 tablet  Given 10/18/18 1102)  dextrose 50 % solution (50 mLs  Given 10/18/18 1059)  bisacodyl (DULCOLAX) EC tablet 10 mg (10 mg Oral Given 10/19/18 1154)  peg 3350 powder (MOVIPREP) kit 100 g (100 g Oral Given 10/19/18 1735)    And  peg 3350 powder (MOVIPREP) kit 100 g (100 g Oral Given 10/19/18 2331)  dextrose 50 % solution (50 mLs  Given 10/18/18 2247)  dextrose 50 % solution 50 mL (0 mLs Intravenous Duplicate 05/17/30 9518)  dextrose 50 % solution (50 mLs  Given 10/18/18 2316)  potassium chloride 10 mEq in 100 mL IVPB (10 mEq Intravenous New Bag/Given 10/19/18 1708)  dextrose 50 % solution 12.5 g (12.5 g Intravenous Given 10/19/18 1741)  dextrose 50 % solution (  Given 10/19/18 1745)  dextrose 50 % solution (50 mLs  Given 10/19/18 2050)  dextrose 50 % solution 50 mL (50 mLs Intravenous Given 10/20/18 0648)   dextrose 50 % solution (50 mLs Intravenous Given 10/20/18 1126)     Initial Impression / Assessment and Plan / ED Course  I have reviewed the triage vital signs and the nursing notes.  Pertinent labs & imaging results that were available during my care of the patient were reviewed by me and considered in my medical decision making (see chart for details).       Patient presents as outlined above.  She had pre-existing diagnosis of colitis.  She continues to have copious amount of bowel movement and has become very weak.  CBC confirms significant anemia.  At this time will initiate blood transfusion and admission.  Patient's mental status and respiratory status are clear.  Her blood pressure is stable.  Final Clinical Impressions(s) / ED Diagnoses   Final diagnoses:  Symptomatic anemia    ED Discharge Orders    None       Charlesetta Shanks, MD 10/20/18 1345

## 2018-10-20 NOTE — Progress Notes (Signed)
Physical Therapy Cancellation Note   10/20/18 0834  PT Visit Information  Last PT Received On 10/20/18  Reason Eval/Treat Not Completed Patient at procedure or test/unavailable. Pt off unit for colonoscopy. PT will continue to follow acutely.    Earney Navy, PTA Acute Rehabilitation Services Pager: [phone] Office: [phone]

## 2018-10-20 NOTE — Anesthesia Preprocedure Evaluation (Signed)
Anesthesia Evaluation  Patient identified by MRN, date of birth, ID band Patient awake    Reviewed: Allergy & Precautions, H&P , NPO status , Patient's Chart, lab work & pertinent test results, reviewed documented beta blocker date and time   Airway Mallampati: II  TM Distance: >3 FB Neck ROM: Full    Dental no notable dental hx. (+) Edentulous Upper, Partial Lower   Pulmonary Current SmokerPatient did not abstain from smoking.,    Pulmonary exam normal breath sounds clear to auscultation       Cardiovascular hypertension, Pt. on medications and Pt. on home beta blockers + CAD, + Past MI and +CHF   Rhythm:Regular Rate:Normal     Neuro/Psych negative neurological ROS  negative psych ROS   GI/Hepatic Neg liver ROS, GERD  ,  Endo/Other  negative endocrine ROS  Renal/GU Renal InsufficiencyRenal disease  negative genitourinary   Musculoskeletal   Abdominal   Peds  Hematology  (+) Blood dyscrasia, anemia ,   Anesthesia Other Findings   Reproductive/Obstetrics negative OB ROS                             Anesthesia Physical Anesthesia Plan  ASA: III  Anesthesia Plan: MAC   Post-op Pain Management:    Induction: Intravenous  PONV Risk Score and Plan: 1 and Propofol infusion  Airway Management Planned: Simple Face Mask  Additional Equipment:   Intra-op Plan:   Post-operative Plan:   Informed Consent: I have reviewed the patients History and Physical, chart, labs and discussed the procedure including the risks, benefits and alternatives for the proposed anesthesia with the patient or authorized representative who has indicated his/her understanding and acceptance.     Dental advisory given  Plan Discussed with: CRNA  Anesthesia Plan Comments:         Anesthesia Quick Evaluation

## 2018-10-20 NOTE — Progress Notes (Signed)
Procedure(s) (LRB): COLONOSCOPY WITH PROPOFOL (N/A) Subjective: 68 year old patient admitted with colitis, rectal bleeding, and incidental finding of right upper lobe spiculated mass evaluated earlier this year with CT scan and PET scan.  I have reviewed the images of the PET scan and CT scan.  I discussed the patient with Dr. Horris Latino for coordination of care.  The patient has clinical stage Clarke lung cancer.  She also has multiple significant medical problems including chronic renal failure on dialysis, diastolic heart failure, and chronic liver disease.  She is not a candidate for surgical resection of her lung tumor.  Objective: Vital signs in last 24 hours: Temp:  [97.6 F (36.4 C)-98.4 F (36.9 C)] 97.6 F (36.4 C) (09/06 0759) Pulse Rate:  [64-73] 64 (09/06 0759) Cardiac Rhythm: Heart block (09/06 0728) Resp:  [18-31] 20 (09/06 0759) BP: (96-126)/(53-95) 96/53 (09/06 0759) SpO2:  [63 %-96 %] 94 % (09/06 0759) FiO2 (%):  [2.5 %] 2.5 % (09/05 1701) Weight:  [72.2 kg] 72.2 kg (09/06 0500)  Hemodynamic parameters for last 24 hours:    Intake/Output from previous day: 09/05 0701 - 09/06 0700 In: 886.9 [P.O.:600; I.V.:180; IV Piggyback:106.9] Out: -  Intake/Output this shift: No intake/output data recorded.    Lab Results: Recent Labs    10/19/18 0550 10/20/18 0536  WBC 5.6 7.1  HGB 10.1* 11.0*  HCT 31.1* 32.9*  PLT 98* 98*   BMET:  Recent Labs    10/19/18 0550 10/20/18 0536  NA 134* 132*  K 3.3* 4.4  CL 96* 98  CO2 26 20*  GLUCOSE 108* 85  BUN 9 15  CREATININE 3.37* 4.21*  CALCIUM 8.8* 8.2*    PT/INR: No results for input(s): LABPROT, INR in the last 72 hours. ABG    Component Value Date/Time   PHART 7.405 08/25/2013 1555   HCO3 34.3 (H) 08/25/2013 1555   TCO2 36 08/25/2013 1555   O2SAT 100.0 08/25/2013 1555   CBG (last 3)  Recent Labs    10/20/18 0159 10/20/18 0349 10/20/18 0718  GLUCAP 90 90 128*    Assessment/Plan: S/P Procedure(s)  (LRB): COLONOSCOPY WITH PROPOFOL (N/A) Recommend thoracic oncology evaluation treatment of the right upper lobe mass.  She is not a candidate for surgery because of advanced clinical stage and severe comorbid medical disease.   LOS: 2 days    Jessica Clarke 10/20/2018

## 2018-10-20 NOTE — Progress Notes (Addendum)
Hypoglycemic Event  CBG: <10  Treatment: D50 50 mL (25 gm)  Symptoms: None  Follow-up CBG: Time:1219 CBG Result: <10  Possible Reasons for Event: Inadequate meal intake  Comments/MD notified:YES  STAT CBG ordered to be done by lab  Arby Barrette

## 2018-10-21 DIAGNOSIS — A09 Infectious gastroenteritis and colitis, unspecified: Secondary | ICD-10-CM

## 2018-10-21 LAB — GLUCOSE, CAPILLARY
Glucose-Capillary: 107 mg/dL — ABNORMAL HIGH (ref 70–99)
Glucose-Capillary: 111 mg/dL — ABNORMAL HIGH (ref 70–99)
Glucose-Capillary: 131 mg/dL — ABNORMAL HIGH (ref 70–99)
Glucose-Capillary: 51 mg/dL — ABNORMAL LOW (ref 70–99)
Glucose-Capillary: 74 mg/dL (ref 70–99)
Glucose-Capillary: 76 mg/dL (ref 70–99)
Glucose-Capillary: 87 mg/dL (ref 70–99)
Glucose-Capillary: 88 mg/dL (ref 70–99)
Glucose-Capillary: 95 mg/dL (ref 70–99)
Glucose-Capillary: 95 mg/dL (ref 70–99)
Glucose-Capillary: 95 mg/dL (ref 70–99)

## 2018-10-21 LAB — COMPREHENSIVE METABOLIC PANEL
ALT: 12 U/L (ref 0–44)
AST: 18 U/L (ref 15–41)
Albumin: 1.9 g/dL — ABNORMAL LOW (ref 3.5–5.0)
Alkaline Phosphatase: 115 U/L (ref 38–126)
Anion gap: 10 (ref 5–15)
BUN: 22 mg/dL (ref 8–23)
CO2: 24 mmol/L (ref 22–32)
Calcium: 7.6 mg/dL — ABNORMAL LOW (ref 8.9–10.3)
Chloride: 98 mmol/L (ref 98–111)
Creatinine, Ser: 5.2 mg/dL — ABNORMAL HIGH (ref 0.44–1.00)
GFR calc Af Amer: 9 mL/min — ABNORMAL LOW (ref >60)
GFR calc non Af Amer: 8 mL/min — ABNORMAL LOW (ref >60)
Glucose, Bld: 87 mg/dL (ref 70–99)
Potassium: 3.6 mmol/L (ref 3.5–5.1)
Sodium: 132 mmol/L — ABNORMAL LOW (ref 135–145)
Total Bilirubin: 1.3 mg/dL — ABNORMAL HIGH (ref 0.3–1.2)
Total Protein: 6.1 g/dL — ABNORMAL LOW (ref 6.5–8.1)

## 2018-10-21 LAB — CBC WITH DIFFERENTIAL/PLATELET
Abs Immature Granulocytes: 0.03 10*3/uL (ref 0.00–0.07)
Basophils Absolute: 0 10*3/uL (ref 0.0–0.1)
Basophils Relative: 1 %
Eosinophils Absolute: 0.1 10*3/uL (ref 0.0–0.5)
Eosinophils Relative: 2 %
HCT: 31 % — ABNORMAL LOW (ref 36.0–46.0)
Hemoglobin: 10.1 g/dL — ABNORMAL LOW (ref 12.0–15.0)
Immature Granulocytes: 1 %
Lymphocytes Relative: 14 %
Lymphs Abs: 0.9 10*3/uL (ref 0.7–4.0)
MCH: 31.9 pg (ref 26.0–34.0)
MCHC: 32.6 g/dL (ref 30.0–36.0)
MCV: 97.8 fL (ref 80.0–100.0)
Monocytes Absolute: 0.6 10*3/uL (ref 0.1–1.0)
Monocytes Relative: 9 %
Neutro Abs: 4.9 10*3/uL (ref 1.7–7.7)
Neutrophils Relative %: 73 %
Platelets: 100 10*3/uL — ABNORMAL LOW (ref 150–400)
RBC: 3.17 MIL/uL — ABNORMAL LOW (ref 3.87–5.11)
RDW: 16.8 % — ABNORMAL HIGH (ref 11.5–15.5)
WBC: 6.6 10*3/uL (ref 4.0–10.5)
nRBC: 0 % (ref 0.0–0.2)

## 2018-10-21 LAB — PHOSPHORUS: Phosphorus: 2.6 mg/dL (ref 2.5–4.6)

## 2018-10-21 MED ORDER — CIPROFLOXACIN HCL 500 MG PO TABS: 500.0000 mg | ORAL_TABLET | Freq: Every day | ORAL | Status: DC

## 2018-10-21 MED ORDER — CIPROFLOXACIN HCL 500 MG PO TABS
500.0000 mg | ORAL_TABLET | Freq: Every day | ORAL | Status: DC
  Administered 2018-10-21 – 2018-10-25 (×5): 500 mg via ORAL
  Filled 2018-10-21 (×6): qty 1

## 2018-10-21 NOTE — Progress Notes (Signed)
Patient ID: Jessica Clarke, female   DOB: 1950/12/03, 68 y.o.   MRN: [id number]  [facility] Progress Note   Assessment/ Plan:   1.  Hematochezia with symptomatic anemia: CT scan of the abdomen showed possible colitis of the ascending colon with colonoscopy done yesterday showing diverticulosis and descending colon/cecal polyps.  Overnight, hemodynamically stable and getting heparin free dialysis.  I am concerned that her significantly low BUN and low albumin might represent a significant hepatic synthetic defect especially with mild transaminitis.  CT scan of the abdomen not supportive of cirrhosis. 2. ESRD: Continue hemodialysis on a Monday/Wednesday/Friday schedule. 3.  Hyponatremia/hypoalbuminemia: Discussed the importance of fluid restriction, low-sodium diet and protein intake. 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 5.  History of spiculated nodule right upper lobe: Ongoing follow-up with oncology 6. Hypotension: Monitor blood pressure trend to decide on need for midodrine.  Subjective:   Reports to be feeling fair, no rectal bleeding overnight.  Denies any abdominal pain but has some nausea this morning.   Objective:   BP (!) 88/54 (BP Location: Right Arm)   Pulse 73   Temp 98.6 F (37 C) (Oral)   Resp 18   Ht 5\' 11"  (1.803 m)   Wt 72 kg   SpO2 99%   BMI 22.14 kg/m   Physical Exam: Gen: Appears to be comfortable resting on hemodialysis. CVS: Pulse regular rhythm, normal rate, S1 and S2 normal Resp: Anteriorly clear to auscultation, no rales/rhonchi.  Hypopigmentation left upper chest noted Abd: Soft, mild tenderness over left lower quadrant, bowel sounds normal Ext: Trace bilateral lower extremity edema.  Left upper arm arteriovenous fistula cannulated.  Labs: BMET Recent Labs  Lab 10/17/18 1751 10/18/18 0500 10/18/18 1126 10/18/18 1750 10/18/18 2352 10/19/18 0550 10/20/18 0536 10/20/18 1256  NA  135 133*  --  131*  --  134* 132*  --   K 2.8* 2.8*  --  3.7  --  3.3* 4.4  --   CL 94* 94*  --  94*  --  96* 98  --   CO2 28 29  --  25  --  26 20*  --   GLUCOSE 95 77 156* 79 150* 108* 85 77  BUN 16 17  --  18  --  9 15  --   CREATININE 4.44* 4.63*  --  5.10*  --  3.37* 4.21*  --   CALCIUM 8.7* 8.8*  --  8.7*  --  8.8* 8.2*  --   PHOS  --   --   --  4.1  --   --   --   --    CBC Recent Labs  Lab 10/18/18 0500 10/18/18 1750 10/19/18 0550 10/20/18 0536  WBC 5.3 6.0 5.6 7.1  NEUTROABS  --   --  4.2 4.7  HGB 10.1* 10.7* 10.1* 11.0*  HCT 30.8* 31.4* 31.1* 32.9*  MCV 98.4 95.4 98.7 96.2  PLT 127* 127* 98* 98*   Medications:    . allopurinol  100 mg Oral Daily  . atenolol  100 mg Oral Daily  . atorvastatin  10 mg Oral QHS  . calcitRIOL  1 mcg Oral Q M,W,F-HD  . Chlorhexidine Gluconate Cloth  6 each Topical Q0600  . Chlorhexidine Gluconate Cloth  6 each Topical Q0600  . cinacalcet  30 mg Oral Q supper  . [START ON 10/25/2018] darbepoetin (ARANESP) injection - DIALYSIS  60 mcg Intravenous Q Fri-HD  . feeding  supplement (PRO-STAT SUGAR FREE 64)  30 mL Oral BID  . hydrocerin   Topical BID  . multivitamin  1 tablet Oral QHS  . sodium chloride flush  3 mL Intravenous Q12H  . sucroferric oxyhydroxide  1,000 mg Oral TID WC  . thiamine  100 mg Oral Daily   Elmarie Shiley, MD 10/21/2018, 9:29 AM

## 2018-10-21 NOTE — Progress Notes (Signed)
[facility] notifed that patient cardiac monitoring has expired 6 hrs ago. Patient is also renal and DM2 and diet need to be renal/ carb mod. Arthor Captain LPN

## 2018-10-21 NOTE — Progress Notes (Signed)
    Progress Note   Subjective  No complaints today. No additional overt GI blood loss. No family present.    Objective  Vital signs in last 24 hours: Temp:  [98 F (36.7 C)-98.7 F (37.1 C)] 98.7 F (37.1 C) (09/07 0842) Pulse Rate:  [69-77] 74 (09/07 1030) Resp:  [17-25] 25 (09/07 0930) BP: (75-116)/(48-74) 105/59 (09/07 1030) SpO2:  [91 %-100 %] 94 % (09/07 0842) Weight:  [72 kg-72.5 kg] 72 kg (09/07 1111) Last BM Date: 10/21/18  General: Alert, chronically ill appearing, in NAD Heart:  Regular rate and rhythm; no murmurs Chest: Clear to ascultation bilaterally Abdomen:  Soft, nondistended. No tenderness today. Normal bowel sounds, without guarding, and without rebound.   Extremities:  Trace nonpitting bilateral pedal edema noted. Neurologic:  Alert and  oriented x4; grossly normal neurologically. Psych:  Alert and cooperative. Normal mood and affect.  Lab Results: Recent Labs    10/19/18 0550 10/20/18 0536 10/21/18 0637  WBC 5.6 7.1 6.6  HGB 10.1* 11.0* 10.1*  HCT 31.1* 32.9* 31.0*  PLT 98* 98* 100*   BMET Recent Labs    10/19/18 0550 10/20/18 0536 10/20/18 1256 10/21/18 0637  NA 134* 132*  --  132*  K 3.3* 4.4  --  3.6  CL 96* 98  --  98  CO2 26 20*  --  24  GLUCOSE 108* 85 77 87  BUN 9 15  --  22  CREATININE 3.37* 4.21*  --  5.20*  CALCIUM 8.8* 8.2*  --  7.6*   LFT Recent Labs    10/21/18 0637  PROT 6.1*  ALBUMIN 1.9*  AST 18  ALT 12  ALKPHOS 115  BILITOT 1.3*   Colonoscopy findings 10/20/18: - Diverticulosis in the sigmoid colon, in the descending colon and in the ascending colon. - Two 2 to 3 mm polyps in the descending colon and in the cecum, removed with a cold snare. Resected and retrieved. - Patches of very mild erythematous, friable (with contact bleeding) and granular mucosa were seen throughout examined colon. Biopsied. This is of unclear clinical significance and has even been seen related to the prep for the colonoscopy. 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 Complete 5 days of Cipro for enteropathogenic E coli. Await random colon biopsy results to exclude a concurrent process.   GI will sign-off. Please call the on-call gastroenterologist with any additional questions or concerns during this hospitalization.       LOS: 3 days   Thornton Park  10/21/2018, 12:11 PM

## 2018-10-21 NOTE — Progress Notes (Signed)
PT Cancellation Note  Patient Details Name: Jessica Clarke MRN: [id number] DOB: February 12, 1951   Cancelled Treatment:    Reason Eval/Treat Not Completed: Patient at procedure or test/unavailable Pt going to HD. Will follow up as schedule allows.   Leighton Ruff, PT, DPT  Acute Rehabilitation Services  Pager: [phone] Office: [phone]    Rudean Hitt 10/21/2018, 8:28 AM

## 2018-10-21 NOTE — Progress Notes (Signed)
PROGRESS NOTE  Jessica Clarke EXB:[id number] DOB: [birth year] DOA: 10/17/2018 PCP: Glendale Chard, MD  HPI/Recap of past 24 hours: HPI from Dr Jessica Clarke Jessica Clarke  is a 68 y.o. female,  w lung cancer, hypertension, hyperlipidemia, CAD, CHF, ESRD on HD (MWF), Anemia , hx of diverticulosis/ adenomatous polyp in colonoscopy on 05/24/2010. On 8/17, presents to the ED with c/o diarrhea for the past few weeks as well as rectal bleeding, both intermittent, found to have colitis on CT, discharged on p.o. Augmentin.  Pt presented to ER for similar complaints, noted black stools, with also some frank blood in his stools, with diarrhea.  Patient left the lobby, went to try to get something to eat at [facility] and was found on floor.  Reported LOC, hitting her head, sustained a laceration above right eyebrow.  In the ED, patient noted to have a hemoglobin of 6.6, hypokalemia 2.8.  Patient was transfused.  Hospitalist called to admit patient.   Today, patient denies any new complaints.  Was seen prior to HD   Assessment/Plan: Principal Problem:   Symptomatic anemia Active Problems:   Hypertension   Smoker   ESRD on dialysis [facility])   Diarrhea   Rectal bleeding   Polyp of colon  Symptomatic anemia likely 2/2 lower GI bleed Hemoglobin noted to be 6.6 on admission Status post 3 units of packed RBC, remained stable CT showed possible ascending colitis Colonoscopy done in 2012 showed diverticulosis, tubular adenoma GI consulted, colonoscopy done on 10/20/2018 showed diverticulosis in the sigmoid, descending and ascending colon.  2 polyps in the descending colon and cecum, pending pathology report Continue iron supplementation Monitor CBC closely  Enteropathogenic E. coli colitis Currently afebrile, with no leukocytosis BC x2 NGTD GI panel positive for enteropathogenic E. Coli, due to persistence diarrhea in a ESRD patients, will treat with 5 days of ciprofloxacin C. difficile positive for antigen, but  negative for toxin, spoke to ID Dr Baxter Flattery on 10/20/18, likely colonization, no need to treat (except if worsening diarrhea, fever, significantly elevated WBC, sepsis etc) CT scan showed possible ascending colitis  Syncope Likely 2/2 symptomatic anemia, rule out cardiac causes EKG with no acute ST changes Echo with EF of 60 to 65%, left ventricular diastolic Doppler parameters are consistent with impaired relaxation CT head no acute intracranial abnormality Telemetry  Hypoglycemia Ongoing Likely due to poor oral intake CBG every 4 hours Hypoglycemic protocol  Mild transaminitis CT abdomen/pelvis without any hepatobiliary abnormality ??metastatic disease to bone Daily CMP, will trend  Hypokalemia Replace PRN  ESRD HD on Monday/Wednesday/Friday Nephrology on board  Hypertension/hyperlipidemia/CAD Currently BP soft Hold home atenolol, continue Lipitor Hold ASA due to rectal bleeding  Gout Continue allopurinol  Hx of scleroderma Very dry skin Eucerin cream for moisturizing   ??Lung cancer, spiculated nodule in the right upper lobe Spoke with Dr. Inda Merlin, he will follow up with patient as an outpatient CTS consulted, not a surgical candidate for resection of lung CA, Dr. Inda Merlin aware  Tobacco abuse Advised to quit Nicotine patch           Malnutrition Type:      Malnutrition Characteristics:      Nutrition Interventions:       Estimated body mass index is 22.29 kg/m as calculated from the following:   Height as of this encounter: 5\' 11"  (1.803 m).   Weight as of this encounter: 72.5 kg.     Code Status: Full  Family Communication: None at bedside  Disposition Plan: To be  determined   Consultants:  Nephrology  GI  Cardiothoracic surgeon  Procedures:  Colonoscopy on 10/20/2018  Antimicrobials:  Zosyn discontinued on 10/20/2018  Ciprofloxacin  DVT prophylaxis: SCDs   Objective: Vitals:   10/21/18 0842 10/21/18 0850  10/21/18 0900 10/21/18 0930  BP: 104/64 (!) 106/57 110/61 105/62  Pulse: 71 71 69 70  Resp: 18 (!) 23 (!) 23 (!) 25  Temp: 98.7 F (37.1 C)     TempSrc: Oral     SpO2: 94%     Weight: 72.5 kg     Height:        Intake/Output Summary (Last 24 hours) at 10/21/2018 1038 Last data filed at 10/20/2018 1145 Gross per 24 hour  Intake 118 ml  Output -  Net 118 ml   Filed Weights   10/20/18 0500 10/20/18 0829 10/21/18 0842  Weight: 72.2 kg 72 kg 72.5 kg    Exam:  General: NAD   Cardiovascular: S1, S2 present  Respiratory:  Diminished breath sounds bilaterally  Abdomen: Soft, nontender, nondistended, bowel sounds present  Musculoskeletal: Trace nonpitting bilateral pedal edema noted, chronic venous skin changes noted  Skin:  Very dry skin  Psychiatry: Normal mood   Data Reviewed: CBC: Recent Labs  Lab 10/18/18 0500 10/18/18 1750 10/19/18 0550 10/20/18 0536 10/21/18 0637  WBC 5.3 6.0 5.6 7.1 6.6  NEUTROABS  --   --  4.2 4.7 4.9  HGB 10.1* 10.7* 10.1* 11.0* 10.1*  HCT 30.8* 31.4* 31.1* 32.9* 31.0*  MCV 98.4 95.4 98.7 96.2 97.8  PLT 127* 127* 98* 98* 381*   Basic Metabolic Panel: Recent Labs  Lab 10/18/18 0500  10/18/18 1750 10/18/18 2352 10/19/18 0550 10/20/18 0536 10/20/18 1256 10/21/18 0637  NA 133*  --  131*  --  134* 132*  --  132*  K 2.8*  --  3.7  --  3.3* 4.4  --  3.6  CL 94*  --  94*  --  96* 98  --  98  CO2 29  --  25  --  26 20*  --  24  GLUCOSE 77   < > 79 150* 108* 85 77 87  BUN 17  --  18  --  9 15  --  22  CREATININE 4.63*  --  5.10*  --  3.37* 4.21*  --  5.20*  CALCIUM 8.8*  --  8.7*  --  8.8* 8.2*  --  7.6*  MG 1.8  --   --   --   --   --   --   --   PHOS  --   --  4.1  --   --   --   --  2.6   < > = values in this interval not displayed.   GFR: Estimated Creatinine Clearance: 11.4 mL/min (A) (by C-G formula based on SCr of 5.2 mg/dL (H)). Liver Function Tests: Recent Labs  Lab 10/17/18 1751 10/18/18 0500 10/18/18 1750 10/19/18  0550 10/20/18 0536 10/21/18 0637  AST 21 22  --  22 QUANTITY NOT SUFFICIENT, UNABLE TO PERFORM TEST 18  ALT 10 11  --  10 QUANTITY NOT SUFFICIENT, UNABLE TO PERFORM TEST 12  ALKPHOS 141* 130*  --  140* 132* 115  BILITOT 1.1 1.8*  --  1.6* QUANTITY NOT SUFFICIENT, UNABLE TO PERFORM TEST 1.3*  PROT 7.4 6.7  --  7.0 6.2* 6.1*  ALBUMIN 2.5* 2.4* 2.4* 2.3* 2.1* 1.9*   No results for input(s): LIPASE, AMYLASE in the  last 168 hours. No results for input(s): AMMONIA in the last 168 hours. Coagulation Profile: No results for input(s): INR, PROTIME in the last 168 hours. Cardiac Enzymes: No results for input(s): CKTOTAL, CKMB, CKMBINDEX, TROPONINI in the last 168 hours. BNP (last 3 results) No results for input(s): PROBNP in the last 8760 hours. HbA1C: No results for input(s): HGBA1C in the last 72 hours. CBG: Recent Labs  Lab 10/21/18 0107 10/21/18 0207 10/21/18 0428 10/21/18 0612 10/21/18 0809  GLUCAP 111* 131* 107* 88 87   Lipid Profile: Recent Labs    10/19/18 0550  CHOL 69  HDL 20*  LDLCALC 41  TRIG 42  CHOLHDL 3.5   Thyroid Function Tests: No results for input(s): TSH, T4TOTAL, FREET4, T3FREE, THYROIDAB in the last 72 hours. Anemia Panel: No results for input(s): VITAMINB12, FOLATE, FERRITIN, TIBC, IRON, RETICCTPCT in the last 72 hours. Urine analysis:    Component Value Date/Time   COLORURINE YELLOW 08/27/2013 0546   APPEARANCEUR CLOUDY (A) 08/27/2013 0546   LABSPEC 1.026 08/27/2013 0546   PHURINE 7.5 08/27/2013 0546   GLUCOSEU NEGATIVE 08/27/2013 0546   HGBUR TRACE (A) 08/27/2013 0546   BILIRUBINUR SMALL (A) 08/27/2013 0546   KETONESUR NEGATIVE 08/27/2013 0546   PROTEINUR >300 (A) 08/27/2013 0546   UROBILINOGEN 0.2 08/27/2013 0546   NITRITE NEGATIVE 08/27/2013 0546   LEUKOCYTESUR NEGATIVE 08/27/2013 0546   Sepsis Labs: @LABRCNTIP (procalcitonin:4,lacticidven:4)  ) Recent Results (from the past 240 hour(s))  SARS CORONAVIRUS 2 (TAT 6-24 HRS)  Nasopharyngeal Nasopharyngeal Swab     Status: None   Collection Time: 10/18/18 12:05 AM   Specimen: Nasopharyngeal Swab  Result Value Ref Range Status   SARS Coronavirus 2 NEGATIVE NEGATIVE Final    Comment: (NOTE) SARS-CoV-2 target nucleic acids are NOT DETECTED. The SARS-CoV-2 RNA is generally detectable in upper and lower respiratory specimens during the acute phase of infection. Negative results do not preclude SARS-CoV-2 infection, do not rule out co-infections with other pathogens, and should not be used as the sole basis for treatment or other patient management decisions. Negative results must be combined with clinical observations, patient history, and epidemiological information. The expected result is Negative. Fact Sheet for Patients: SugarRoll.be Fact Sheet for Healthcare Providers: https://www.woods-mathews.com/ This test is not yet approved or cleared by the [place] FDA and  has been authorized for detection and/or diagnosis of SARS-CoV-2 by FDA under an Emergency Use Authorization (EUA). This EUA will remain  in effect (meaning this test can be used) for the duration of the COVID-19 declaration under Section 56 4(b)(1) of the Act, 21 U.S.C. section 360bbb-3(b)(1), unless the authorization is terminated or revoked sooner. Performed at [facility], [place] [address], [place], [place] [address]   Culture, blood (routine x 2)     Status: None (Preliminary result)   Collection Time: 10/18/18 12:10 PM   Specimen: BLOOD RIGHT HAND  Result Value Ref Range Status   Specimen Description BLOOD RIGHT HAND  Final   Special Requests   Final    BOTTLES DRAWN AEROBIC ONLY Blood Culture adequate volume   Culture   Final    NO GROWTH 3 DAYS Performed at [facility], [place] [address] [place], [place] [address]    Report Status PENDING  Incomplete  Gastrointestinal Panel by PCR , Stool     Status: Abnormal   Collection  Time: 10/19/18 10:11 AM   Specimen: Stool  Result Value Ref Range Status   Campylobacter species NOT DETECTED NOT DETECTED Final  Plesimonas shigelloides NOT DETECTED NOT DETECTED Final   Salmonella species NOT DETECTED NOT DETECTED Final   Yersinia enterocolitica NOT DETECTED NOT DETECTED Final   Vibrio species NOT DETECTED NOT DETECTED Final   Vibrio cholerae NOT DETECTED NOT DETECTED Final   Enteroaggregative E coli (EAEC) NOT DETECTED NOT DETECTED Final   Enteropathogenic E coli (EPEC) DETECTED (A) NOT DETECTED Final    Comment: RESULT CALLED TO, READ BACK BY AND VERIFIED WITH: LAURYN BROWN AT 1330 10/20/2018.PMF    Enterotoxigenic E coli (ETEC) NOT DETECTED NOT DETECTED Final   Shiga like toxin producing E coli (STEC) NOT DETECTED NOT DETECTED Final   Shigella/Enteroinvasive E coli (EIEC) NOT DETECTED NOT DETECTED Final   Cryptosporidium NOT DETECTED NOT DETECTED Final   Cyclospora cayetanensis NOT DETECTED NOT DETECTED Final   Entamoeba histolytica NOT DETECTED NOT DETECTED Final   Giardia lamblia NOT DETECTED NOT DETECTED Final   Adenovirus F40/41 NOT DETECTED NOT DETECTED Final   Astrovirus NOT DETECTED NOT DETECTED Final   Norovirus GI/GII NOT DETECTED NOT DETECTED Final   Rotavirus A NOT DETECTED NOT DETECTED Final   Sapovirus (I, II, IV, and V) NOT DETECTED NOT DETECTED Final    Comment: Performed at [facility], [address] [place], [place] [address]  C Difficile Quick Screen w PCR reflex     Status: Abnormal   Collection Time: 10/19/18  4:05 PM   Specimen: Stool  Result Value Ref Range Status   C Diff antigen POSITIVE (A) NEGATIVE Final   C Diff toxin NEGATIVE NEGATIVE Final   C Diff interpretation Results are indeterminate. See PCR results.  Final    Comment: Performed at [facility], [place] [address], [place], [place] [address]  C. Diff by PCR, Reflexed     Status: None   Collection Time: 10/19/18  4:05 PM  Result Value Ref Range Status    Toxigenic C. Difficile by PCR NEGATIVE NEGATIVE Final    Comment: Patient is colonized with non toxigenic C. difficile. May not need treatment unless significant symptoms are present. Performed at [facility], [place] [address], [place], [place] [address]       Studies: No results found.  Scheduled Meds: . allopurinol  100 mg Oral Daily  . atenolol  100 mg Oral Daily  . atorvastatin  10 mg Oral QHS  . calcitRIOL  1 mcg Oral Q M,W,F-HD  . Chlorhexidine Gluconate Cloth  6 each Topical Q0600  . Chlorhexidine Gluconate Cloth  6 each Topical Q0600  . cinacalcet  30 mg Oral Q supper  . [START ON 10/25/2018] darbepoetin (ARANESP) injection - DIALYSIS  60 mcg Intravenous Q Fri-HD  . feeding supplement (PRO-STAT SUGAR FREE 64)  30 mL Oral BID  . hydrocerin   Topical BID  . multivitamin  1 tablet Oral QHS  . sodium chloride flush  3 mL Intravenous Q12H  . sucroferric oxyhydroxide  1,000 mg Oral TID WC  . thiamine  100 mg Oral Daily    Continuous Infusions: . sodium chloride       LOS: 3 days     Alma Friendly, MD [facility]  If 7PM-7AM, please contact night-coverage [email or web address] 10/21/2018, 10:38 AM

## 2018-10-21 NOTE — Progress Notes (Signed)
Pharmacy Antibiotic Note  Jessica Clarke is a 68 y.o. female admitted on 10/17/2018 with anemia, diarrhea and rectal bleeding.  Pharmacy has been consulted for Cipro dosing for intra-abdominal infection/colitis and C. Diff PCR positive for antigens.  Patient has ESRD on TTS HD.  Afebrile, WBC WNL.  Plan: Start ciprofloxacin 500 mg PO Q24H Follow cultures  Height: 5\' 11"  (180.3 cm) Weight: 159 lb 13.3 oz (72.5 kg) IBW/kg (Calculated) : 70.8  Temp (24hrs), Avg:98.2 F (36.8 C), Min:97.4 F (36.3 C), Max:98.7 F (37.1 C)  Recent Labs  Lab 10/18/18 0500 10/18/18 1750 10/19/18 0550 10/20/18 0536 10/21/18 0637  WBC 5.3 6.0 5.6 7.1 6.6  CREATININE 4.63* 5.10* 3.37* 4.21* 5.20*    Estimated Creatinine Clearance: 11.4 mL/min (A) (by C-G formula based on SCr of 5.2 mg/dL (H)).    No Known Allergies  Cipro 9/7 >> Zosyn 9/4 >>9/6 Flagyl x1 9/4  9/5 C.diff PCR: positive 9/5 GI panel PCR: E. Coli 9/4 COVID: negative    Jessica Clarke L. Devin Going, PharmD, [place] PGY1 Pharmacy Resident 10/21/18      11:04 AM  Please check AMION for all [place] phone numbers After 10:00 PM, call the [place] [phone]

## 2018-10-21 NOTE — Plan of Care (Signed)
  Problem: Clinical Measurements: Goal: Ability to maintain clinical measurements within normal limits will improve Outcome: Progressing Goal: Will remain free from infection Outcome: Progressing Goal: Diagnostic test results will improve Outcome: Progressing Goal: Respiratory complications will improve Outcome: Progressing Goal: Cardiovascular complication will be avoided Outcome: Progressing   Problem: Nutrition: Goal: Adequate nutrition will be maintained Outcome: Progressing   Problem: Pain Managment: Goal: General experience of comfort will improve Outcome: Progressing   Problem: Safety: Goal: Ability to remain free from injury will improve Outcome: Progressing   Problem: Skin Integrity: Goal: Risk for impaired skin integrity will decrease Outcome: Progressing

## 2018-10-21 NOTE — Care Management Important Message (Signed)
Important Message  Patient Details  Name: Jessica Clarke MRN: [id number] Date of Birth: 1951-01-25   Medicare Important Message Given:  Yes     Kizzy Olafson [address] 10/21/2018, 3:28 PM

## 2018-10-21 NOTE — Procedures (Signed)
Patient seen on Hemodialysis. BP 105/62   Pulse 70   Temp 98.7 F (37.1 C) (Oral)   Resp (!) 25   Ht 5\' 11"  (1.803 m)   Wt 72 kg   SpO2 94%   BMI 22.14 kg/m   QB 400, UF goal 3.5L Tolerating treatment without complaints at this time.   Elmarie Shiley MD [facility]. Office # [phone] Pager # [phone] 9:40 AM

## 2018-10-21 NOTE — Progress Notes (Signed)
Hypoglycemic Event  CBG: 51  Treatment: 8 oz juice/soda  Symptoms: Pale  Follow-up CBG: Time:0107 CBG Result:111  Possible Reasons for Event: Unknown  Comments/MD notified:hypoglycemic protocol followed.This is a recurrent issues    Jessica Clarke

## 2018-10-22 ENCOUNTER — Encounter: Payer: Self-pay | Admitting: *Deleted

## 2018-10-22 ENCOUNTER — Encounter (HOSPITAL_COMMUNITY): Payer: Self-pay | Admitting: *Deleted

## 2018-10-22 ENCOUNTER — Inpatient Hospital Stay (HOSPITAL_COMMUNITY): Payer: Medicare Other

## 2018-10-22 DIAGNOSIS — R911 Solitary pulmonary nodule: Secondary | ICD-10-CM

## 2018-10-22 DIAGNOSIS — IMO0001 Reserved for inherently not codable concepts without codable children: Secondary | ICD-10-CM

## 2018-10-22 LAB — CBC WITH DIFFERENTIAL/PLATELET
Abs Immature Granulocytes: 0.02 10*3/uL (ref 0.00–0.07)
Basophils Absolute: 0 10*3/uL (ref 0.0–0.1)
Basophils Relative: 0 %
Eosinophils Absolute: 0.1 10*3/uL (ref 0.0–0.5)
Eosinophils Relative: 2 %
HCT: 29.6 % — ABNORMAL LOW (ref 36.0–46.0)
Hemoglobin: 9.6 g/dL — ABNORMAL LOW (ref 12.0–15.0)
Immature Granulocytes: 0 %
Lymphocytes Relative: 14 %
Lymphs Abs: 0.9 10*3/uL (ref 0.7–4.0)
MCH: 31.9 pg (ref 26.0–34.0)
MCHC: 32.4 g/dL (ref 30.0–36.0)
MCV: 98.3 fL (ref 80.0–100.0)
Monocytes Absolute: 0.6 10*3/uL (ref 0.1–1.0)
Monocytes Relative: 11 %
Neutro Abs: 4.3 10*3/uL (ref 1.7–7.7)
Neutrophils Relative %: 73 %
Platelets: 61 10*3/uL — ABNORMAL LOW (ref 150–400)
RBC: 3.01 MIL/uL — ABNORMAL LOW (ref 3.87–5.11)
RDW: 16.9 % — ABNORMAL HIGH (ref 11.5–15.5)
WBC: 6 10*3/uL (ref 4.0–10.5)
nRBC: 0 % (ref 0.0–0.2)

## 2018-10-22 LAB — GLUCOSE, CAPILLARY
Glucose-Capillary: 92 mg/dL (ref 70–99)
Glucose-Capillary: 82 mg/dL (ref 70–99)
Glucose-Capillary: 94 mg/dL (ref 70–99)
Glucose-Capillary: 81 mg/dL (ref 70–99)
Glucose-Capillary: 75 mg/dL (ref 70–99)
Glucose-Capillary: 104 mg/dL — ABNORMAL HIGH (ref 70–99)
Glucose-Capillary: 232 mg/dL — ABNORMAL HIGH (ref 70–99)
Glucose-Capillary: 303 mg/dL — ABNORMAL HIGH (ref 70–99)
Glucose-Capillary: 69 mg/dL — ABNORMAL LOW (ref 70–99)
Glucose-Capillary: 72 mg/dL (ref 70–99)
Glucose-Capillary: 31 mg/dL — CL (ref 70–99)
Glucose-Capillary: 91 mg/dL (ref 70–99)
Glucose-Capillary: 83 mg/dL (ref 70–99)
Glucose-Capillary: 83 mg/dL (ref 70–99)

## 2018-10-22 LAB — COMPREHENSIVE METABOLIC PANEL
ALT: 10 U/L (ref 0–44)
AST: 19 U/L (ref 15–41)
Albumin: 1.9 g/dL — ABNORMAL LOW (ref 3.5–5.0)
Alkaline Phosphatase: 117 U/L (ref 38–126)
Anion gap: 8 (ref 5–15)
BUN: 15 mg/dL (ref 8–23)
CO2: 25 mmol/L (ref 22–32)
Calcium: 7.4 mg/dL — ABNORMAL LOW (ref 8.9–10.3)
Chloride: 97 mmol/L — ABNORMAL LOW (ref 98–111)
Creatinine, Ser: 3.39 mg/dL — ABNORMAL HIGH (ref 0.44–1.00)
GFR calc Af Amer: 15 mL/min — ABNORMAL LOW (ref >60)
GFR calc non Af Amer: 13 mL/min — ABNORMAL LOW (ref >60)
Glucose, Bld: 91 mg/dL (ref 70–99)
Potassium: 3.6 mmol/L (ref 3.5–5.1)
Sodium: 130 mmol/L — ABNORMAL LOW (ref 135–145)
Total Bilirubin: 1.1 mg/dL (ref 0.3–1.2)
Total Protein: 5.9 g/dL — ABNORMAL LOW (ref 6.5–8.1)

## 2018-10-22 LAB — LACTIC ACID, PLASMA: Lactic Acid, Venous: 1.2 mmol/L (ref 0.5–1.9)

## 2018-10-22 MED ORDER — BOOST / RESOURCE BREEZE PO LIQD CUSTOM
1.0000 | Freq: Two times a day (BID) | ORAL | Status: DC
  Administered 2018-10-22 – 2018-10-26 (×6): 1 via ORAL

## 2018-10-22 MED ORDER — SODIUM CHLORIDE 0.9 % IV BOLUS
500.0000 mL | Freq: Once | INTRAVENOUS | Status: AC
  Administered 2018-10-22: 500 mL via INTRAVENOUS

## 2018-10-22 MED ORDER — ONDANSETRON HCL 4 MG/2ML IJ SOLN
4.0000 mg | Freq: Four times a day (QID) | INTRAMUSCULAR | Status: DC | PRN
  Administered 2018-10-22: 4 mg via INTRAVENOUS
  Filled 2018-10-22: qty 2

## 2018-10-22 MED ORDER — MIDODRINE HCL 5 MG PO TABS
10.0000 mg | ORAL_TABLET | Freq: Two times a day (BID) | ORAL | Status: DC
  Administered 2018-10-22 – 2018-10-26 (×8): 10 mg via ORAL
  Filled 2018-10-22 (×6): qty 2

## 2018-10-22 NOTE — Progress Notes (Signed)
PT Cancellation Note  Patient Details Name: Jessica Clarke MRN: [id number] DOB: 09-09-1950   Cancelled Treatment:    Reason Eval/Treat Not Completed: Patient declined, no reason specified. Checked on pt twice this am. Did not want to get up first time because she was taking some meds. Came back later and she refused for no specific reason, just did not want to get up. Encouragement given, especially as she had mentioned to the nurse that her bottom was hurting but she could not be persuaded at this time. Pt has been refusing last few sessions, potential for PT sign off if she is disagreeable next time.   Leighton Roach, [place]  Pager [phone] Office [place] 10/22/2018, 11:34 AM

## 2018-10-22 NOTE — Progress Notes (Signed)
Oncology Nurse Navigator Documentation  Oncology Nurse Navigator Flowsheets 10/22/2018  Navigator Location [facility]  Navigator Encounter Type Other  Barriers/Navigation Needs Coordination of Care/I received a message from Dr. Julien Nordmann to get patient scheduled with him.  I notified new patient coordinator to call and schedule patient to be seen on 10/28/18.  Interventions Coordination of Care  Coordination of Care Other  Acuity Level 1  Time Spent with Patient 15

## 2018-10-22 NOTE — Progress Notes (Signed)
  [facility] notiified pation bp 80/53 and patient c/o nausea. Arthor Captain LPN

## 2018-10-22 NOTE — Progress Notes (Addendum)
PROGRESS NOTE  Jessica Clarke OJJ:[id number] DOB: 12/31/1950 DOA: 10/17/2018 PCP: Glendale Chard, MD  HPI/Recap of past 24 hours: HPI from Dr Maudie Mercury Jessica Clarke,  w lung cancer, hypertension, hyperlipidemia, CAD, CHF, ESRD on HD (MWF), Anemia , hx of diverticulosis/ adenomatous polyp in colonoscopy on 05/24/2010. On 8/17, presents to the ED with c/o diarrhea for the past few weeks as well as rectal bleeding, both intermittent, found to have colitis on CT, discharged on p.o. Augmentin.  Pt presented to ER for similar complaints, noted black stools, with also some frank blood in his stools, with diarrhea.  Patient left the lobby, went to try to get something to eat at [facility] and was found on floor.  Reported LOC, hitting her head, sustained a laceration above right eyebrow.  In the ED, patient noted to have a hemoglobin of 6.6, hypokalemia 2.8.  Patient was transfused.  Hospitalist called to admit patient.    Today, patient denies any new complaints, very poor historian.  Noted to be refusing medications, PT etc.    Assessment/Plan: Principal Problem:   Symptomatic anemia Active Problems:   Hypertension   Smoker   ESRD on dialysis ([facility])   Diarrhea   Rectal bleeding   Polyp of colon  Symptomatic anemia likely 2/2 lower GI bleed Hemoglobin noted to be 6.6 on admission Status post 3 units of packed RBC, remained stable CT showed possible ascending colitis Colonoscopy done in 2012 showed diverticulosis, tubular adenoma GI consulted, colonoscopy done on 10/20/2018 showed diverticulosis in the sigmoid, descending and ascending colon.  2 polyps in the descending colon and cecum, pending pathology report Continue iron supplementation Monitor CBC closely  Enteropathogenic E. coli colitis Currently afebrile, with no leukocytosis BC x2 NGTD GI panel positive for enteropathogenic E. Coli, due to persistence diarrhea in a ESRD patients, will treat with 5 days of ciprofloxacin  C. difficile positive for antigen, but negative for toxin, spoke to ID Dr Baxter Flattery on 10/20/18, likely colonization, no need to treat (except if worsening diarrhea, fever, significantly elevated WBC, sepsis etc) CT scan showed possible ascending colitis  Syncope Resolved Likely 2/2 symptomatic anemia, rule out cardiac causes EKG with no acute ST changes Echo with EF of 60 to 65%, left ventricular diastolic Doppler parameters are consistent with impaired relaxation CT head no acute intracranial abnormality Telemetry  Hypoglycemia Improving Likely due to poor oral intake CBG every 4 hours Hypoglycemic protocol  Mild transaminitis CT abdomen/pelvis without any hepatobiliary abnormality ??metastatic disease to bone Daily CMP, will trend  ESRD HD on Monday/Wednesday/Friday Nephrology on board  Hypotension Normal mentation R/O sepsis: Afebrile, no leukocytosis, lactic acid 1.2 Blood culture x2 NGTD, repeat pending Chest x-ray unremarkable Likely HD related, spoke to Dr Posey Pronto, Nephrology, can start Midodrin Monitor closely  Hypertension/hyperlipidemia/CAD Hold home atenolol, continue Lipitor Hold ASA due to rectal bleeding  Gout Continue allopurinol  Hx of scleroderma Very dry skin Eucerin cream for moisturizing   ??Lung cancer, spiculated nodule in the right upper lobe Spoke with Dr. Inda Merlin, he will follow up with patient as an outpatient CTS consulted, not a surgical candidate for resection of lung CA, Dr. Inda Merlin aware  Tobacco abuse Advised to quit Nicotine patch  [facility] Palliative consulted           Malnutrition Type:      Malnutrition Characteristics:      Nutrition Interventions:       Estimated body mass index is 21.37 kg/m as calculated from the  following:   Height as of this encounter: 5\' 11"  (1.803 m).   Weight as of this encounter: 69.5 kg.     Code Status: Full  Family Communication: Spoke to daughter on 10/21/2018   Disposition Plan: To be determined   Consultants:  Nephrology  GI  Cardiothoracic surgeon  Procedures:  Colonoscopy on 10/20/2018  Antimicrobials:  Zosyn discontinued on 10/20/2018  Ciprofloxacin  DVT prophylaxis: SCDs   Objective: Vitals:   10/22/18 0400 10/22/18 0736 10/22/18 1241 10/22/18 1556  BP: (!) 84/53 (!) 87/62 (!) 98/59 (!) 86/58  Pulse: 83 74 73 73  Resp: 19 18 16 18   Temp: 98 F (36.7 C) 97.9 F (36.6 C) 98.2 F (36.8 C) 98.2 F (36.8 C)  TempSrc: Oral Oral Oral Oral  SpO2: 100% (!) 89% 97% 94%  Weight:      Height:       No intake or output data in the 24 hours ending 10/22/18 1606 Filed Weights   10/20/18 0829 10/21/18 0842 10/21/18 1328  Weight: 72 kg 72.5 kg 69.5 kg    Exam:  General: NAD, chronically ill-appearing  Cardiovascular: S1, S2 present  Respiratory:  Diminished breath sounds bilaterally  Abdomen: Soft, nontender, nondistended, bowel sounds present  Musculoskeletal: Trace nonpitting bilateral pedal edema noted, chronic venous skin changes noted  Skin:  Very dry skin  Psychiatry: Normal mood   Data Reviewed: CBC: Recent Labs  Lab 10/18/18 1750 10/19/18 0550 10/20/18 0536 10/21/18 0637 10/22/18 0330  WBC 6.0 5.6 7.1 6.6 6.0  NEUTROABS  --  4.2 4.7 4.9 4.3  HGB 10.7* 10.1* 11.0* 10.1* 9.6*  HCT 31.4* 31.1* 32.9* 31.0* 29.6*  MCV 95.4 98.7 96.2 97.8 98.3  PLT 127* 98* 98* 100* 61*   Basic Metabolic Panel: Recent Labs  Lab 10/18/18 0500  10/18/18 1750  10/19/18 0550 10/20/18 0536 10/20/18 1256 10/21/18 0637 10/22/18 0330  NA 133*  --  131*  --  134* 132*  --  132* 130*  K 2.8*  --  3.7  --  3.3* 4.4  --  3.6 3.6  CL 94*  --  94*  --  96* 98  --  98 97*  CO2 29  --  25  --  26 20*  --  24 25  GLUCOSE 77   < > 79   < > 108* 85 77 87 91  BUN 17  --  18  --  9 15  --  22 15  CREATININE 4.63*  --  5.10*  --  3.37* 4.21*  --  5.20* 3.39*  CALCIUM 8.8*  --  8.7*  --  8.8* 8.2*  --  7.6* 7.4*  MG 1.8  --   --    --   --   --   --   --   --   PHOS  --   --  4.1  --   --   --   --  2.6  --    < > = values in this interval not displayed.   GFR: Estimated Creatinine Clearance: 17.2 mL/min (A) (by C-G formula based on SCr of 3.39 mg/dL (H)). Liver Function Tests: Recent Labs  Lab 10/18/18 0500 10/18/18 1750 10/19/18 0550 10/20/18 0536 10/21/18 0637 10/22/18 0330  AST 22  --  22 QUANTITY NOT SUFFICIENT, UNABLE TO PERFORM TEST 18 19  ALT 11  --  10 QUANTITY NOT SUFFICIENT, UNABLE TO PERFORM TEST 12 10  ALKPHOS 130*  --  140*  132* 115 117  BILITOT 1.8*  --  1.6* QUANTITY NOT SUFFICIENT, UNABLE TO PERFORM TEST 1.3* 1.1  PROT 6.7  --  7.0 6.2* 6.1* 5.9*  ALBUMIN 2.4* 2.4* 2.3* 2.1* 1.9* 1.9*   No results for input(s): LIPASE, AMYLASE in the last 168 hours. No results for input(s): AMMONIA in the last 168 hours. Coagulation Profile: No results for input(s): INR, PROTIME in the last 168 hours. Cardiac Enzymes: No results for input(s): CKTOTAL, CKMB, CKMBINDEX, TROPONINI in the last 168 hours. BNP (last 3 results) No results for input(s): PROBNP in the last 8760 hours. HbA1C: No results for input(s): HGBA1C in the last 72 hours. CBG: Recent Labs  Lab 10/22/18 0402 10/22/18 0655 10/22/18 0747 10/22/18 1133 10/22/18 1604  GLUCAP 82 81 75 91 83   Lipid Profile: No results for input(s): CHOL, HDL, LDLCALC, TRIG, CHOLHDL, LDLDIRECT in the last 72 hours. Thyroid Function Tests: No results for input(s): TSH, T4TOTAL, FREET4, T3FREE, THYROIDAB in the last 72 hours. Anemia Panel: No results for input(s): VITAMINB12, FOLATE, FERRITIN, TIBC, IRON, RETICCTPCT in the last 72 hours. Urine analysis:    Component Value Date/Time   COLORURINE YELLOW 08/27/2013 0546   APPEARANCEUR CLOUDY (A) 08/27/2013 0546   LABSPEC 1.026 08/27/2013 0546   PHURINE 7.5 08/27/2013 0546   GLUCOSEU NEGATIVE 08/27/2013 0546   HGBUR TRACE (A) 08/27/2013 0546   BILIRUBINUR SMALL (A) 08/27/2013 0546   KETONESUR  NEGATIVE 08/27/2013 0546   PROTEINUR >300 (A) 08/27/2013 0546   UROBILINOGEN 0.2 08/27/2013 0546   NITRITE NEGATIVE 08/27/2013 0546   LEUKOCYTESUR NEGATIVE 08/27/2013 0546   Sepsis Labs: @LABRCNTIP (procalcitonin:4,lacticidven:4)  ) Recent Results (from the past 240 hour(s))  SARS CORONAVIRUS 2 (TAT 6-24 HRS) Nasopharyngeal Nasopharyngeal Swab     Status: None   Collection Time: 10/18/18 12:05 AM   Specimen: Nasopharyngeal Swab  Result Value Ref Range Status   SARS Coronavirus 2 NEGATIVE NEGATIVE Final    Comment: (NOTE) SARS-CoV-2 target nucleic acids are NOT DETECTED. The SARS-CoV-2 RNA is generally detectable in upper and lower respiratory specimens during the acute phase of infection. Negative results do not preclude SARS-CoV-2 infection, do not rule out co-infections with other pathogens, and should not be used as the sole basis for treatment or other patient management decisions. Negative results must be combined with clinical observations, patient history, and epidemiological information. The expected result is Negative. Fact Sheet for Patients: SugarRoll.be Fact Sheet for Healthcare Providers: https://www.woods-mathews.com/ This test is not yet approved or cleared by the [place] FDA and  has been authorized for detection and/or diagnosis of SARS-CoV-2 by FDA under an Emergency Use Authorization (EUA). This EUA will remain  in effect (meaning this test can be used) for the duration of the COVID-19 declaration under Section 56 4(b)(1) of the Act, 21 U.S.C. section 360bbb-3(b)(1), unless the authorization is terminated or revoked sooner. Performed at [facility], [place] [address] [place], [place] [address]   Culture, blood (routine x 2)     Status: None (Preliminary result)   Collection Time: 10/18/18 12:10 PM   Specimen: BLOOD RIGHT HAND  Result Value Ref Range Status   Specimen Description BLOOD RIGHT HAND  Final    Special Requests   Final    BOTTLES DRAWN AEROBIC ONLY Blood Culture adequate volume   Culture   Final    NO GROWTH 4 DAYS Performed at [facility], [place] [address] [place], [place] [address]    Report Status PENDING  Incomplete  Gastrointestinal Panel by PCR , Stool     Status: Abnormal   Collection Time: 10/19/18 10:11 AM   Specimen: Stool  Result Value Ref Range Status   Campylobacter species NOT DETECTED NOT DETECTED Final   Plesimonas shigelloides NOT DETECTED NOT DETECTED Final   Salmonella species NOT DETECTED NOT DETECTED Final   Yersinia enterocolitica NOT DETECTED NOT DETECTED Final   Vibrio species NOT DETECTED NOT DETECTED Final   Vibrio cholerae NOT DETECTED NOT DETECTED Final   Enteroaggregative E coli (EAEC) NOT DETECTED NOT DETECTED Final   Enteropathogenic E coli (EPEC) DETECTED (A) NOT DETECTED Final    Comment: RESULT CALLED TO, READ BACK BY AND VERIFIED WITH: LAURYN BROWN AT 1330 10/20/2018.PMF    Enterotoxigenic E coli (ETEC) NOT DETECTED NOT DETECTED Final   Shiga like toxin producing E coli (STEC) NOT DETECTED NOT DETECTED Final   Shigella/Enteroinvasive E coli (EIEC) NOT DETECTED NOT DETECTED Final   Cryptosporidium NOT DETECTED NOT DETECTED Final   Cyclospora cayetanensis NOT DETECTED NOT DETECTED Final   Entamoeba histolytica NOT DETECTED NOT DETECTED Final   Giardia lamblia NOT DETECTED NOT DETECTED Final   Adenovirus F40/41 NOT DETECTED NOT DETECTED Final   Astrovirus NOT DETECTED NOT DETECTED Final   Norovirus GI/GII NOT DETECTED NOT DETECTED Final   Rotavirus A NOT DETECTED NOT DETECTED Final   Sapovirus (I, II, IV, and V) NOT DETECTED NOT DETECTED Final    Comment: Performed at [facility], [address] [place], [place] [address]  C Difficile Quick Screen w PCR reflex     Status: Abnormal   Collection Time: 10/19/18  4:05 PM   Specimen: Stool  Result Value Ref Range Status   C Diff antigen POSITIVE (A) NEGATIVE Final   C Diff  toxin NEGATIVE NEGATIVE Final   C Diff interpretation Results are indeterminate. See PCR results.  Final    Comment: Performed at [facility], [place] [address], [place], [place] [address]  C. Diff by PCR, Reflexed     Status: None   Collection Time: 10/19/18  4:05 PM  Result Value Ref Range Status   Toxigenic C. Difficile by PCR NEGATIVE NEGATIVE Final    Comment: Patient is colonized with non toxigenic C. difficile. May not need treatment unless significant symptoms are present. Performed at [facility], [place] [address] [place], [place] [address]       Studies: Dg Chest Port 1 View  Result Date: 10/22/2018 CLINICAL DATA:  Hypotension EXAM: PORTABLE CHEST 1 VIEW COMPARISON:  10/10/2016, 12/25/2017, PET-CT from 02/26/2018 FINDINGS: Cardiac shadow is enlarged. Persistent right upper lobe nodule is seen similar to that noted on prior PET-CT. This is consistent with the patient's given clinical history. Small right pleural effusion is noted. Mild central vascular congestion is noted likely related to the underlying renal failure. No bony abnormality is seen. IMPRESSION: Small right pleural effusion. Stable appearing right upper lobe mass. Mild vascular congestion. Electronically Signed   By: Inez Catalina M.D.   On: 10/22/2018 10:23    Scheduled Meds: . allopurinol  100 mg Oral Daily  . atorvastatin  10 mg Oral QHS  . calcitRIOL  1 mcg Oral Q M,W,F-HD  . Chlorhexidine Gluconate Cloth  6 each Topical Q0600  . Chlorhexidine Gluconate Cloth  6 each Topical Q0600  . cinacalcet  30 mg Oral Q supper  . ciprofloxacin  500 mg Oral Q1200  . [START ON 10/25/2018] darbepoetin (ARANESP) injection - DIALYSIS  60 mcg  Intravenous Q Fri-HD  . feeding supplement (PRO-STAT SUGAR FREE 64)  30 mL Oral BID  . hydrocerin   Topical BID  . multivitamin  1 tablet Oral QHS  . sodium chloride flush  3 mL Intravenous Q12H  . sucroferric oxyhydroxide  1,000 mg Oral TID WC  . thiamine  100 mg Oral Daily     Continuous Infusions: . sodium chloride       LOS: 4 days     Alma Friendly, MD [facility]  If 7PM-7AM, please contact night-coverage [email or web address] 10/22/2018, 4:06 PM

## 2018-10-22 NOTE — Progress Notes (Signed)
Initial Nutrition Assessment  DOCUMENTATION CODES:    Not applicable  INTERVENTION:  Continue 30 ml Prostat po BID, each supplement provides 100 kcal and 15 grams of protein.   Provide Boost Breeze po BID, each supplement provides 250 kcal and 9 grams of protein.  Encourage adequate PO intake.   NUTRITION DIAGNOSIS:   Increased nutrient needs related to chronic illness(ESRD on HD, lung cancer) as evidenced by estimated needs.  GOAL:   Patient will meet greater than or equal to 90% of their needs  MONITOR:   PO intake, Supplement acceptance, Skin, Weight trends, Labs, I & O's  REASON FOR ASSESSMENT:   Consult Assessment of nutrition requirement/status  ASSESSMENT:   68 y.o. female,  w lung cancer, hypertension, hyperlipidemia, CAD, CHF, ESRD on HD (MWF), Anemia , hx of diverticulosis/ adenomatous polyp in colonoscopy on 05/24/2010. On 8/17, presents to the ED with c/o diarrhea for the past few weeks as well as rectal bleeding, both intermittent, found to have colitis on CT, discharged on p.o. Augmentin.  Pt presented to ER for similar complaints, noted black stools, with also some frank blood in his stools, with diarrhea. GI panel positive for enteropathogenic E. Coli, due to persistence diarrhea in a ESRD patient.  Colonoscopy 10/20/18: patchy erythema throughout the colon  Pt unavailable during attempted time of contact. RD unable to obtain pt nutrition history at this time. Meal completion has been 100%. Per MD, recent symptoms and colonoscopy findings are likely due to acute infectious colitis from enteropathogenic E coli. Pt currently has Prostat ordered and has been consuming them.   Unable to complete Nutrition-Focused physical exam at this time.   Labs and medications reviewed.   Diet Order:   Diet Order            Diet renal with fluid restriction Fluid restriction: 1200 mL Fluid; Room service appropriate? Yes; Fluid consistency: Thin  Diet effective now               EDUCATION NEEDS:   Not appropriate for education at this time  Skin:  Skin Assessment: Reviewed RN Assessment  Last BM:  9/7  Height:   Ht Readings from Last 1 Encounters:  10/20/18 5\' 11"  (1.803 m)    Weight:   Wt Readings from Last 1 Encounters:  10/21/18 69.5 kg    Ideal Body Weight:  70.45 kg  BMI:  Body mass index is 21.37 kg/m.  Estimated Nutritional Needs:   Kcal:  2376-2831  Protein:  95-105 grams  Fluid:  1.2L/day    Corrin Parker, MS, RD, LDN Pager # [phone] After hours/ weekend pager # [phone]

## 2018-10-22 NOTE — Progress Notes (Signed)
Patient ID: Jessica Clarke, female   DOB: Nov 04, 1950, 68 y.o.   MRN: [id number]  [facility] Progress Note   Assessment/ Plan:   1.  Hematochezia with symptomatic anemia: Her previous work-up shows evidence of EPEC colitis with recommendations for antibiotics and supportive management as recommended by GI; random colon biopsy pending.  A slight downward drift of hemoglobin/hematocrit noted but this is unlikely to be the explanation for her overnight hypotension. 2. ESRD: Continue hemodialysis on a Monday/Wednesday/Friday schedule.  Will order for her next hemodialysis again tomorrow (heparin free) 3.  Hyponatremia/hypoalbuminemia: Discussed the importance of fluid restriction, low-sodium diet and protein intake. 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 5.  History of spiculated nodule right upper lobe: Ongoing follow-up with oncology 6. Hypotension: Monitor blood pressure trend to decide on need for midodrine.  Subjective:   Reports no rectal bleeding overnight, appears fatigued this morning.   Objective:   BP (!) 87/62 (BP Location: Right Arm)   Pulse 74   Temp 97.9 F (36.6 C) (Oral)   Resp 18   Ht 5\' 11"  (1.803 m)   Wt 69.5 kg   SpO2 (!) 89%   BMI 21.37 kg/m   Physical Exam: Gen: Comfortably sleeping in bed, awakens with some difficulty. CVS: Pulse regular rhythm, normal rate, S1 and S2 normal Resp: Anteriorly clear to auscultation, no rales/rhonchi.  Hypopigmentation left upper chest noted Abd: Soft, mild tenderness over left lower quadrant, bowel sounds normal Ext: Trace bilateral lower extremity edema.  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  Labs: BMET Recent Labs  Lab 10/17/18 1751 10/18/18 0500  10/18/18 1750 10/18/18 2352 10/19/18 0550 10/20/18 0536 10/20/18 1256 10/21/18 0637 10/22/18 0330  NA 135 133*  --  131*  --  134* 132*  --  132* 130*  K 2.8* 2.8*  --  3.7  --  3.3*  4.4  --  3.6 3.6  CL 94* 94*  --  94*  --  96* 98  --  98 97*  CO2 28 29  --  25  --  26 20*  --  24 25  GLUCOSE 95 77   < > 79 150* 108* 85 77 87 91  BUN 16 17  --  18  --  9 15  --  22 15  CREATININE 4.44* 4.63*  --  5.10*  --  3.37* 4.21*  --  5.20* 3.39*  CALCIUM 8.7* 8.8*  --  8.7*  --  8.8* 8.2*  --  7.6* 7.4*  PHOS  --   --   --  4.1  --   --   --   --  2.6  --    < > = values in this interval not displayed.   CBC Recent Labs  Lab 10/19/18 0550 10/20/18 0536 10/21/18 0637 10/22/18 0330  WBC 5.6 7.1 6.6 6.0  NEUTROABS 4.2 4.7 4.9 4.3  HGB 10.1* 11.0* 10.1* 9.6*  HCT 31.1* 32.9* 31.0* 29.6*  MCV 98.7 96.2 97.8 98.3  PLT 98* 98* 100* 61*   Medications:    . allopurinol  100 mg Oral Daily  . atorvastatin  10 mg Oral QHS  . calcitRIOL  1 mcg Oral Q M,W,F-HD  . Chlorhexidine Gluconate Cloth  6 each Topical Q0600  . Chlorhexidine Gluconate Cloth  6 each Topical Q0600  . cinacalcet  30 mg Oral Q supper  . ciprofloxacin  500 mg Oral Q1200  . [START  ON 10/25/2018] darbepoetin (ARANESP) injection - DIALYSIS  60 mcg Intravenous Q Fri-HD  . feeding supplement (PRO-STAT SUGAR FREE 64)  30 mL Oral BID  . hydrocerin   Topical BID  . multivitamin  1 tablet Oral QHS  . sodium chloride flush  3 mL Intravenous Q12H  . sucroferric oxyhydroxide  1,000 mg Oral TID WC  . thiamine  100 mg Oral Daily   Elmarie Shiley, MD 10/22/2018, 8:32 AM

## 2018-10-22 NOTE — Progress Notes (Addendum)
Patient refused Velphoro at this time. Patient verbally abusive towards nurse saying "I just took this, I'm not taking this shit right now." "I'm tired of y'all coming in and bothering me." medication discarded in sharps container in patient room. Dr. Horris Latino notified. Nurse will continue to monitor. Jessica Clarke

## 2018-10-23 LAB — RENAL FUNCTION PANEL
Albumin: 1.9 g/dL — ABNORMAL LOW (ref 3.5–5.0)
Anion gap: 8 (ref 5–15)
BUN: 29 mg/dL — ABNORMAL HIGH (ref 8–23)
CO2: 24 mmol/L (ref 22–32)
Calcium: 7.5 mg/dL — ABNORMAL LOW (ref 8.9–10.3)
Chloride: 94 mmol/L — ABNORMAL LOW (ref 98–111)
Creatinine, Ser: 4.49 mg/dL — ABNORMAL HIGH (ref 0.44–1.00)
GFR calc Af Amer: 11 mL/min — ABNORMAL LOW (ref >60)
GFR calc non Af Amer: 9 mL/min — ABNORMAL LOW (ref >60)
Glucose, Bld: 83 mg/dL (ref 70–99)
Phosphorus: 2.5 mg/dL (ref 2.5–4.6)
Potassium: 3.8 mmol/L (ref 3.5–5.1)
Sodium: 126 mmol/L — ABNORMAL LOW (ref 135–145)

## 2018-10-23 LAB — CBC WITH DIFFERENTIAL/PLATELET
Abs Immature Granulocytes: 0.01 10*3/uL (ref 0.00–0.07)
Basophils Absolute: 0 10*3/uL (ref 0.0–0.1)
Basophils Relative: 1 %
Eosinophils Absolute: 0.2 10*3/uL (ref 0.0–0.5)
Eosinophils Relative: 3 %
HCT: 29.1 % — ABNORMAL LOW (ref 36.0–46.0)
Hemoglobin: 9.5 g/dL — ABNORMAL LOW (ref 12.0–15.0)
Immature Granulocytes: 0 %
Lymphocytes Relative: 16 %
Lymphs Abs: 0.9 10*3/uL (ref 0.7–4.0)
MCH: 32.1 pg (ref 26.0–34.0)
MCHC: 32.6 g/dL (ref 30.0–36.0)
MCV: 98.3 fL (ref 80.0–100.0)
Monocytes Absolute: 0.7 10*3/uL (ref 0.1–1.0)
Monocytes Relative: 13 %
Neutro Abs: 3.9 10*3/uL (ref 1.7–7.7)
Neutrophils Relative %: 67 %
Platelets: 76 10*3/uL — ABNORMAL LOW (ref 150–400)
RBC: 2.96 MIL/uL — ABNORMAL LOW (ref 3.87–5.11)
RDW: 16.4 % — ABNORMAL HIGH (ref 11.5–15.5)
WBC: 5.7 10*3/uL (ref 4.0–10.5)
nRBC: 0 % (ref 0.0–0.2)

## 2018-10-23 LAB — BASIC METABOLIC PANEL
Anion gap: 8 (ref 5–15)
BUN: 16 mg/dL (ref 8–23)
CO2: 27 mmol/L (ref 22–32)
Calcium: 7.8 mg/dL — ABNORMAL LOW (ref 8.9–10.3)
Chloride: 100 mmol/L (ref 98–111)
Creatinine, Ser: 2.85 mg/dL — ABNORMAL HIGH (ref 0.44–1.00)
GFR calc Af Amer: 19 mL/min — ABNORMAL LOW (ref >60)
GFR calc non Af Amer: 16 mL/min — ABNORMAL LOW (ref >60)
Glucose, Bld: 128 mg/dL — ABNORMAL HIGH (ref 70–99)
Potassium: 3.8 mmol/L (ref 3.5–5.1)
Sodium: 135 mmol/L (ref 135–145)

## 2018-10-23 LAB — GLUCOSE, CAPILLARY
Glucose-Capillary: 109 mg/dL — ABNORMAL HIGH (ref 70–99)
Glucose-Capillary: 12 mg/dL — CL (ref 70–99)
Glucose-Capillary: 39 mg/dL — CL (ref 70–99)
Glucose-Capillary: 53 mg/dL — ABNORMAL LOW (ref 70–99)
Glucose-Capillary: 59 mg/dL — ABNORMAL LOW (ref 70–99)
Glucose-Capillary: 61 mg/dL — ABNORMAL LOW (ref 70–99)
Glucose-Capillary: 62 mg/dL — ABNORMAL LOW (ref 70–99)
Glucose-Capillary: 66 mg/dL — ABNORMAL LOW (ref 70–99)
Glucose-Capillary: 80 mg/dL (ref 70–99)
Glucose-Capillary: 87 mg/dL (ref 70–99)
Glucose-Capillary: 95 mg/dL (ref 70–99)
Glucose-Capillary: 97 mg/dL (ref 70–99)

## 2018-10-23 LAB — CULTURE, BLOOD (ROUTINE X 2)
Culture: NO GROWTH
Special Requests: ADEQUATE

## 2018-10-23 MED ORDER — CALCITRIOL 0.5 MCG PO CAPS
ORAL_CAPSULE | ORAL | Status: AC
  Filled 2018-10-23: qty 2

## 2018-10-23 MED ORDER — MIDODRINE HCL 5 MG PO TABS
ORAL_TABLET | ORAL | Status: AC
  Administered 2018-10-23: 10 mg via ORAL
  Filled 2018-10-23: qty 2

## 2018-10-23 MED ORDER — DEXTROSE 50 % IV SOLN
INTRAVENOUS | Status: AC
  Administered 2018-10-23: 50 mL
  Filled 2018-10-23: qty 50

## 2018-10-23 MED ORDER — DEXTROSE 50 % IV SOLN: 1.0000 | Freq: Once | INTRAVENOUS | Status: AC

## 2018-10-23 NOTE — Progress Notes (Signed)
Physical Therapy Cancellation Note   10/23/18 0908  PT Visit Information  Last PT Received On 10/23/18  Reason Eval/Treat Not Completed Patient at procedure or test/unavailable. Pt off unit for HD.   Earney Navy, PTA Acute Rehabilitation Services Pager: [phone] Office: [phone]

## 2018-10-23 NOTE — Progress Notes (Signed)
Patient scheduled for dialysis this morning. CBG dropped overnight to 66. Checked before leaving unit, and it was 80. Patient given juice. Nurse called to check if hemodialysis could order breakfast, but they said they only had sandwiches to offer. Nurse told hemodialysis her CBG drops quickly and to monitor closely. [place]

## 2018-10-23 NOTE — Progress Notes (Signed)
Physical Therapy Treatment Patient Details Name: Jessica Clarke MRN: [id number] DOB: 1950-05-25 Today's Date: 10/23/2018    History of Present Illness 68 y.o. female pt presented to ED with C/O rectal bleeding, diarrhea for past few days. ESRD on HD MWF. PMH: HTN, scleroderma, gout, CAD, scattered diverticulosis, sessile rectal polyp, spiculated RUL nodule suspicious for non-small cell lung cancer (last seen by oncology 03/05/2018), current smoker, AOCD, SHPT.  Was seen in ED for rectal bleeding 09/29/2018.    PT Comments    Patient seen for mobility progression. Pt requires max encouragement to participate. Pt requires min A for functional transfer/gait training this session. Session limited due to bowel incontinence. Pt requires max A for hygiene. Continue to progress as tolerated.     Follow Up Recommendations  Home health PT;Supervision/Assistance - 24 hour(if family cant provide 24/7 then may need SNF)     Equipment Recommendations  3in1 (PT)    Recommendations for Other Services       Precautions / Restrictions Precautions Precautions: Fall Restrictions Weight Bearing Restrictions: No    Mobility  Bed Mobility Overal bed mobility: Needs Assistance Bed Mobility: Supine to Sit     Supine to sit: Min guard     General bed mobility comments: min guard for safety  Transfers Overall transfer level: Needs assistance Equipment used: 1 person hand held assist Transfers: Sit to/from Stand Sit to Stand: Min assist;Mod assist         General transfer comment: increased assist needed from commode with use of grab bar  Ambulation/Gait Ambulation/Gait assistance: Min assist Gait Distance (Feet): 24 Feet Assistive device: None;1 person hand held assist Gait Pattern/deviations: Shuffle;Decreased stride length;Step-through pattern Gait velocity: decreased   General Gait Details: one posterior LOB when turning; assist to steady    Stairs             Wheelchair  Mobility    Modified Rankin (Stroke Patients Only)       Balance Overall balance assessment: Needs assistance   Sitting balance-Leahy Scale: Good     Standing balance support: During functional activity Standing balance-Leahy Scale: Fair                              Cognition Arousal/Alertness: Awake/alert Behavior During Therapy: WFL for tasks assessed/performed Overall Cognitive Status: No family/caregiver present to determine baseline cognitive functioning Area of Impairment: Safety/judgement;Problem solving;Following commands                       Following Commands: Follows one step commands inconsistently;Follows one step commands with increased time Safety/Judgement: Decreased awareness of safety;Decreased awareness of deficits   Problem Solving: Decreased initiation;Difficulty sequencing;Requires verbal cues General Comments: seems HOH; lots of repetition of cues      Exercises      General Comments General comments (skin integrity, edema, etc.): pt incontinent of bowel while ambulating in room      Pertinent Vitals/Pain Pain Assessment: No/denies pain    Home Living                      Prior Function            PT Goals (current goals can now be found in the care plan section) Progress towards PT goals: Progressing toward goals    Frequency    Min 3X/week      PT Plan Current plan remains appropriate    Co-evaluation  AM-PAC PT "6 Clicks" Mobility   Outcome Measure  Help needed turning from your back to your side while in a flat bed without using bedrails?: A Little Help needed moving from lying on your back to sitting on the side of a flat bed without using bedrails?: A Little Help needed moving to and from a bed to a chair (including a wheelchair)?: A Little Help needed standing up from a chair using your arms (e.g., wheelchair or bedside chair)?: A Little Help needed to walk in hospital  room?: A Little Help needed climbing 3-5 steps with a railing? : A Little 6 Click Score: 18    End of Session Equipment Utilized During Treatment: Gait belt Activity Tolerance: Patient tolerated treatment well Patient left: with call bell/phone within reach;in chair Nurse Communication: Mobility status;Other (comment)(NT notified pt has no chair alarm & needs new bed linens) PT Visit Diagnosis: Unsteadiness on feet (R26.81);Difficulty in walking, not elsewhere classified (R26.2);Muscle weakness (generalized) (M62.81)     Time: 6147-0929 PT Time Calculation (min) (ACUTE ONLY): 41 min  Charges:  $Gait Training: 8-22 mins $Therapeutic Activity: 23-37 mins                     Earney Navy, PTA Acute Rehabilitation Services Pager: [phone] Office: [phone]     Darliss Cheney 10/23/2018, 4:46 PM

## 2018-10-23 NOTE — Progress Notes (Signed)
Hypoglycemic Event  CBG: 53  Treatment: amp D50  Symptoms: none  Follow-up CBG: Time: 1832 CBG Result: 28  Possible Reasons for Event: Poor intake  Comments/MD notified: Dr. Karleen Hampshire. Orders to put patient on q1 CBG. Give more OJ with sugar added and pudding. Will continue to monitor.    [place]

## 2018-10-23 NOTE — Procedures (Signed)
Patient seen on Hemodialysis. BP (!) 103/56   Pulse 71   Temp 98.7 F (37.1 C) (Oral)   Resp 16   Ht 5\' 11"  (1.803 m)   Wt 73.5 kg   SpO2 91%   BMI 22.60 kg/m   QB 400, UF goal 1.5L Tolerating treatment without complaints at this time.   Elmarie Shiley MD [facility]. Office # [phone] Pager # [phone] 9:42 AM

## 2018-10-23 NOTE — Progress Notes (Signed)
Hypoglycemic Event  CBG: 62  Treatment: OJ, peanut butter, crackers  Symptoms: none  Follow-up CBG: Time: 1739 CBG Result: 53  Possible Reasons for Event: poor intake volume  Comments/MD notified: Dr. Karleen Hampshire notified. Orders to give amp of D50 and order BMP.    [place]

## 2018-10-23 NOTE — Progress Notes (Signed)
PROGRESS NOTE    Jessica Clarke  WPY:[id number] DOB: 02-24-1950 DOA: 10/17/2018 PCP: Glendale Chard, MD   Brief Narrative:  68 year old lady with prior h/o lung cancer, hypertension, CAD, ESRD ON HD, diverticulosis, anemia, diastolic CHF, presents to ED WITH complaints of diarrhea and rectal bleeding. She underwent CT of the abdomen showing Stable edematous changes in the ascending colon consistent with colitis although the mild ascites and third spacing of fluid may contribute to this abnormality. She was discharged on oral augmentin. Pt presents to ED with similar complaints and hematochezia and severe anemia.   Assessment & Plan:   Principal Problem:   Symptomatic anemia Active Problems:   Hypertension   Smoker   ESRD on dialysis ([facility])   Diarrhea   Rectal bleeding   Polyp of colon   Symptomatic anemia sec to lower GI bleed:  S/p 3 units of prbc transfusion and repeat H&H is around 9. Gi consulted, underwent colonoscopy on 9/6 showing diverticulosis in the sigmoid , descending and ascending colon.  Pathology shows tubular adenoma.     Enteropathogenic e coli colitis.  GI panel shows E COLI.  Complete 5 days of antibiotics.  Pt continues to have diarrhea, though she is afebrile and no leukocytosis.    Syncope:  Sec to symptomatic anemia.  Echocardiogram showed LVEF 60 TO 05%, diastolic parameters consistent with impaired relaxation.  CT head NEGATIVE.     Hypoglycemia: Sec to poor oral intake. Please continue with cbg's every 1 hour.  Persistent hypoglycemia today of unclear etiology. Pt asymptomatic.  Check CBG'S every 1 hour   Hypotension:  Normal lactic acid.  Afebrile, no leukocytosis.  Probably from HD. Midodrine added by nephrology.    Hypertension;  bp parameters improved.    H/o lung cancer with nodule in thr right upper lobe:  Recommend out pateint follow up with Dr Julien Nordmann.  Pt not a surgical candidate.    Tobacco abuse:  Cessation counseling  given.  Continue with nicotine patch.   ESRD: ON HD.  As per nephrology.   Hyponatremia, hypoalbuminemia:  Continue with low salt diet and fluid restriction.    In view of multiple medical problems. Poor oral intake, persistent hypoglycemia, clinical deterioration, deconditioning, palliative care consulted for goals of care.    DVT prophylaxis:SCD'S Code Status: full code.  Family Communication: none at bedside.  Disposition Plan: pending clinical improvement. Possibly SNF VS HOME HEALTH PT.    Consultants:   Nephrology  Palliative care.    Procedures:  None.    Antimicrobials: ciprofloxacin.    Subjective: No chest pain or sob. No nausea, vomiting.   Objective: Vitals:   10/23/18 1130 10/23/18 1145 10/23/18 1200 10/23/18 1238  BP: (!) 142/61 (!) 91/52 (!) 100/54 100/65  Pulse: 67 71 71 70  Resp:  (!) 21 18 18   Temp:   (!) 97.3 F (36.3 C)   TempSrc:   Oral   SpO2:   93% 93%  Weight:   71.9 kg   Height:        Intake/Output Summary (Last 24 hours) at 10/23/2018 1527 Last data filed at 10/23/2018 1245 Gross per 24 hour  Intake 476 ml  Output 1000 ml  Net -524 ml   Filed Weights   10/23/18 0500 10/23/18 0820 10/23/18 1200  Weight: 75.2 kg 73.5 kg 71.9 kg    Examination:  General exam: Appears calm and comfortable  Respiratory system: Clear to auscultation. Respiratory effort normal. Cardiovascular system: S1 & S2 heard, RRR. No  JVD,  Gastrointestinal system: Abdomen is nondistended, soft and nontender. No organomegaly or masses felt. Normal bowel sounds heard. Central nervous system: Alert and oriented person and place only.  Extremities: Symmetric 5 x 5 power. Skin: No rashes, lesions or ulcers Psychiatry:  Mood & affect appropriate.     Data Reviewed: I have personally reviewed following labs and imaging studies  CBC: Recent Labs  Lab 10/19/18 0550 10/20/18 0536 10/21/18 0637 10/22/18 0330 10/23/18 0552  WBC 5.6 7.1 6.6 6.0 5.7   NEUTROABS 4.2 4.7 4.9 4.3 3.9  HGB 10.1* 11.0* 10.1* 9.6* 9.5*  HCT 31.1* 32.9* 31.0* 29.6* 29.1*  MCV 98.7 96.2 97.8 98.3 98.3  PLT 98* 98* 100* 61* 76*   Basic Metabolic Panel: Recent Labs  Lab 10/18/18 0500  10/18/18 1750  10/19/18 0550 10/20/18 0536 10/20/18 1256 10/21/18 0637 10/22/18 0330 10/23/18 0500  NA 133*  --  131*  --  134* 132*  --  132* 130* 126*  K 2.8*  --  3.7  --  3.3* 4.4  --  3.6 3.6 3.8  CL 94*  --  94*  --  96* 98  --  98 97* 94*  CO2 29  --  25  --  26 20*  --  24 25 24   GLUCOSE 77   < > 79   < > 108* 85 77 87 91 83  BUN 17  --  18  --  9 15  --  22 15 29*  CREATININE 4.63*  --  5.10*  --  3.37* 4.21*  --  5.20* 3.39* 4.49*  CALCIUM 8.8*  --  8.7*  --  8.8* 8.2*  --  7.6* 7.4* 7.5*  MG 1.8  --   --   --   --   --   --   --   --   --   PHOS  --   --  4.1  --   --   --   --  2.6  --  2.5   < > = values in this interval not displayed.   GFR: Estimated Creatinine Clearance: 13.2 mL/min (A) (by C-G formula based on SCr of 4.49 mg/dL (H)). Liver Function Tests: Recent Labs  Lab 10/18/18 0500  10/19/18 0550 10/20/18 0536 10/21/18 0637 10/22/18 0330 10/23/18 0500  AST 22  --  22 QUANTITY NOT SUFFICIENT, UNABLE TO PERFORM TEST 18 19  --   ALT 11  --  10 QUANTITY NOT SUFFICIENT, UNABLE TO PERFORM TEST 12 10  --   ALKPHOS 130*  --  140* 132* 115 117  --   BILITOT 1.8*  --  1.6* QUANTITY NOT SUFFICIENT, UNABLE TO PERFORM TEST 1.3* 1.1  --   PROT 6.7  --  7.0 6.2* 6.1* 5.9*  --   ALBUMIN 2.4*   < > 2.3* 2.1* 1.9* 1.9* 1.9*   < > = values in this interval not displayed.   No results for input(s): LIPASE, AMYLASE in the last 168 hours. No results for input(s): AMMONIA in the last 168 hours. Coagulation Profile: No results for input(s): INR, PROTIME in the last 168 hours. Cardiac Enzymes: No results for input(s): CKTOTAL, CKMB, CKMBINDEX, TROPONINI in the last 168 hours. BNP (last 3 results) No results for input(s): PROBNP in the last 8760 hours.  HbA1C: No results for input(s): HGBA1C in the last 72 hours. CBG: Recent Labs  Lab 10/23/18 0008 10/23/18 0103 10/23/18 0340 10/23/18 0755 10/23/18 1126  GLUCAP 66*  109* 87 80 95   Lipid Profile: No results for input(s): CHOL, HDL, LDLCALC, TRIG, CHOLHDL, LDLDIRECT in the last 72 hours. Thyroid Function Tests: No results for input(s): TSH, T4TOTAL, FREET4, T3FREE, THYROIDAB in the last 72 hours. Anemia Panel: No results for input(s): VITAMINB12, FOLATE, FERRITIN, TIBC, IRON, RETICCTPCT in the last 72 hours. Sepsis Labs: Recent Labs  Lab 10/22/18 1106  LATICACIDVEN 1.2    Recent Results (from the past 240 hour(s))  SARS CORONAVIRUS 2 (TAT 6-24 HRS) Nasopharyngeal Nasopharyngeal Swab     Status: None   Collection Time: 10/18/18 12:05 AM   Specimen: Nasopharyngeal Swab  Result Value Ref Range Status   SARS Coronavirus 2 NEGATIVE NEGATIVE Final    Comment: (NOTE) SARS-CoV-2 target nucleic acids are NOT DETECTED. The SARS-CoV-2 RNA is generally detectable in upper and lower respiratory specimens during the acute phase of infection. Negative results do not preclude SARS-CoV-2 infection, do not rule out co-infections with other pathogens, and should not be used as the sole basis for treatment or other patient management decisions. Negative results must be combined with clinical observations, patient history, and epidemiological information. The expected result is Negative. Fact Sheet for Patients: SugarRoll.be Fact Sheet for Healthcare Providers: https://www.woods-mathews.com/ This test is not yet approved or cleared by the [place] FDA and  has been authorized for detection and/or diagnosis of SARS-CoV-2 by FDA under an Emergency Use Authorization (EUA). This EUA will remain  in effect (meaning this test can be used) for the duration of the COVID-19 declaration under Section 56 4(b)(1) of the Act, 21 U.S.C. section  360bbb-3(b)(1), unless the authorization is terminated or revoked sooner. Performed at [facility], [place] [address], [place], [place] [address]   Culture, blood (routine x 2)     Status: None   Collection Time: 10/18/18 12:10 PM   Specimen: BLOOD RIGHT HAND  Result Value Ref Range Status   Specimen Description BLOOD RIGHT HAND  Final   Special Requests   Final    BOTTLES DRAWN AEROBIC ONLY Blood Culture adequate volume   Culture   Final    NO GROWTH 5 DAYS Performed at [facility], [place] [address] [place], [place] [address]    Report Status 10/23/2018 FINAL  Final  Gastrointestinal Panel by PCR , Stool     Status: Abnormal   Collection Time: 10/19/18 10:11 AM   Specimen: Stool  Result Value Ref Range Status   Campylobacter species NOT DETECTED NOT DETECTED Final   Plesimonas shigelloides NOT DETECTED NOT DETECTED Final   Salmonella species NOT DETECTED NOT DETECTED Final   Yersinia enterocolitica NOT DETECTED NOT DETECTED Final   Vibrio species NOT DETECTED NOT DETECTED Final   Vibrio cholerae NOT DETECTED NOT DETECTED Final   Enteroaggregative E coli (EAEC) NOT DETECTED NOT DETECTED Final   Enteropathogenic E coli (EPEC) DETECTED (A) NOT DETECTED Final    Comment: RESULT CALLED TO, READ BACK BY AND VERIFIED WITH: LAURYN BROWN AT 1330 10/20/2018.PMF    Enterotoxigenic E coli (ETEC) NOT DETECTED NOT DETECTED Final   Shiga like toxin producing E coli (STEC) NOT DETECTED NOT DETECTED Final   Shigella/Enteroinvasive E coli (EIEC) NOT DETECTED NOT DETECTED Final   Cryptosporidium NOT DETECTED NOT DETECTED Final   Cyclospora cayetanensis NOT DETECTED NOT DETECTED Final   Entamoeba histolytica NOT DETECTED NOT DETECTED Final   Giardia lamblia NOT DETECTED NOT DETECTED Final   Adenovirus F40/41 NOT DETECTED NOT DETECTED Final   Astrovirus NOT DETECTED NOT  DETECTED Final   Norovirus GI/GII NOT DETECTED NOT DETECTED Final   Rotavirus A NOT DETECTED NOT DETECTED Final    Sapovirus (I, II, IV, and V) NOT DETECTED NOT DETECTED Final    Comment: Performed at [facility], [place], [place] [address]  C Difficile Quick Screen w PCR reflex     Status: Abnormal   Collection Time: 10/19/18  4:05 PM   Specimen: Stool  Result Value Ref Range Status   C Diff antigen POSITIVE (A) NEGATIVE Final   C Diff toxin NEGATIVE NEGATIVE Final   C Diff interpretation Results are indeterminate. See PCR results.  Final    Comment: Performed at [facility], [place] [address], [place], [place] [address]  C. Diff by PCR, Reflexed     Status: None   Collection Time: 10/19/18  4:05 PM  Result Value Ref Range Status   Toxigenic C. Difficile by PCR NEGATIVE NEGATIVE Final    Comment: Patient is colonized with non toxigenic C. difficile. May not need treatment unless significant symptoms are present. Performed at [facility], [place] [address], [place], [place] [address]   Culture, blood (routine x 2)     Status: None (Preliminary result)   Collection Time: 10/22/18 11:06 AM   Specimen: BLOOD RIGHT ARM  Result Value Ref Range Status   Specimen Description BLOOD RIGHT ARM  Final   Special Requests   Final    BOTTLES DRAWN AEROBIC ONLY Blood Culture adequate volume   Culture   Final    NO GROWTH 1 DAY Performed at [facility], [place] [address], [place], [place] [address]    Report Status PENDING  Incomplete  Culture, blood (routine x 2)     Status: None (Preliminary result)   Collection Time: 10/22/18 11:06 AM   Specimen: BLOOD RIGHT ARM  Result Value Ref Range Status   Specimen Description BLOOD RIGHT ARM  Final   Special Requests   Final    BOTTLES DRAWN AEROBIC ONLY Blood Culture results may not be optimal due to an inadequate volume of blood received in culture bottles   Culture   Final    NO GROWTH 1 DAY Performed at [facility], [place] [address] [place], [place] [address]    Report Status PENDING  Incomplete          Radiology Studies: Dg Chest Port 1 View  Result Date: 10/22/2018 CLINICAL DATA:  Hypotension EXAM: PORTABLE CHEST 1 VIEW COMPARISON:  10/10/2016, 12/25/2017, PET-CT from 02/26/2018 FINDINGS: Cardiac shadow is enlarged. Persistent right upper lobe nodule is seen similar to that noted on prior PET-CT. This is consistent with the patient's given clinical history. Small right pleural effusion is noted. Mild central vascular congestion is noted likely related to the underlying renal failure. No bony abnormality is seen. IMPRESSION: Small right pleural effusion. Stable appearing right upper lobe mass. Mild vascular congestion. Electronically Signed   By: Inez Catalina M.D.   On: 10/22/2018 10:23        Scheduled Meds: . allopurinol  100 mg Oral Daily  . atorvastatin  10 mg Oral QHS  . calcitRIOL  1 mcg Oral Q M,W,F-HD  . Chlorhexidine Gluconate Cloth  6 each Topical Q0600  . Chlorhexidine Gluconate Cloth  6 each Topical Q0600  . cinacalcet  30 mg Oral Q supper  . ciprofloxacin  500 mg Oral Q1200  . [START ON 10/25/2018] darbepoetin (ARANESP) injection - DIALYSIS  60  mcg Intravenous Q Fri-HD  . feeding supplement  1 Container Oral BID BM  . feeding supplement (PRO-STAT SUGAR FREE 64)  30 mL Oral BID  . hydrocerin   Topical BID  . midodrine  10 mg Oral BID WC  . multivitamin  1 tablet Oral QHS  . sodium chloride flush  3 mL Intravenous Q12H  . sucroferric oxyhydroxide  1,000 mg Oral TID WC  . thiamine  100 mg Oral Daily   Continuous Infusions: . sodium chloride       LOS: 5 days    Time spent: 45 minutes.     Hosie Poisson, MD [facility] Pager [phone] If 7PM-7AM, please contact night-coverage [email or web address] Password [email or web address] 10/23/2018, 3:27 PM

## 2018-10-23 NOTE — Progress Notes (Signed)
PALLIATIVE NOTE:  Referral received for goals of care discussion due to multiple co-morbidities. Patient currently in HD. Will attempt to see patient at a later time.   Detailed note and recommendations to follow [place].   Thank you for your referral and allowing Palliative Medicine to assist in patient's care.   Alda Lea, AGPCNP-BC Palliative Medicine Team  Phone: [phone] Pager: [phone] Amion: N. [place]   NO CHARGE

## 2018-10-23 NOTE — Progress Notes (Signed)
Patient ID: Jessica Clarke, female   DOB: Aug 26, 1950, 68 y.o.   MRN: [id number]  [facility] Progress Note   Assessment/ Plan:   1.  Hematochezia with symptomatic anemia: Her previous work-up shows evidence of EPEC colitis with recommendations for antibiotics and supportive management as recommended by GI; random colon biopsy pending.  A slight downtrend of H/H noted without overt loss-- will monitor. Started yesterday on midodrine for chronic asymptomatic hypotension. On ESA as out-patient (last mircera dose given 9/2).  2. ESRD: Continue hemodialysis on a Monday/Wednesday/Friday schedule with heparin-free dialysis today.  3.  Hyponatremia/hypoalbuminemia: Discussed the importance of fluid restriction, low-sodium diet and protein intake. UF with HD today.  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 5.  History of spiculated nodule right upper lobe: Ongoing follow-up with oncology 6. Hypotension: low without evidence of significant blood loss or sepsis- now on midodrine.  Subjective:   Reports no rectal bleeding overnight, slept poorly overnight due to movement in and out of her room.   Objective:   BP (!) 103/56   Pulse 71   Temp 98.7 F (37.1 C) (Oral)   Resp 16   Ht 5\' 11"  (1.803 m)   Wt 73.5 kg   SpO2 91%   BMI 22.60 kg/m   Physical Exam: Gen: Comfortably resting in dialysis. CVS: Pulse regular rhythm, normal rate, S1 and S2 normal Resp: Anteriorly clear to auscultation, no rales/rhonchi.  Hypopigmentation left upper chest noted Abd: Soft, mild tenderness over left lower quadrant, bowel sounds normal Ext: Trace bilateral lower extremity edema.  Left upper arm arteriovenous fistula cannulated.  Labs: BMET Recent Labs  Lab 10/18/18 0500  10/18/18 1750 10/18/18 2352 10/19/18 0550 10/20/18 0536 10/20/18 1256 10/21/18 0637 10/22/18 0330 10/23/18 0500  NA 133*  --  131*  --  134* 132*  --  132* 130* 126*  K  2.8*  --  3.7  --  3.3* 4.4  --  3.6 3.6 3.8  CL 94*  --  94*  --  96* 98  --  98 97* 94*  CO2 29  --  25  --  26 20*  --  24 25 24   GLUCOSE 77   < > 79 150* 108* 85 77 87 91 83  BUN 17  --  18  --  9 15  --  22 15 29*  CREATININE 4.63*  --  5.10*  --  3.37* 4.21*  --  5.20* 3.39* 4.49*  CALCIUM 8.8*  --  8.7*  --  8.8* 8.2*  --  7.6* 7.4* 7.5*  PHOS  --   --  4.1  --   --   --   --  2.6  --  2.5   < > = values in this interval not displayed.   CBC Recent Labs  Lab 10/19/18 0550 10/20/18 0536 10/21/18 0637 10/22/18 0330  WBC 5.6 7.1 6.6 6.0  NEUTROABS 4.2 4.7 4.9 4.3  HGB 10.1* 11.0* 10.1* 9.6*  HCT 31.1* 32.9* 31.0* 29.6*  MCV 98.7 96.2 97.8 98.3  PLT 98* 98* 100* 61*   Medications:    . allopurinol  100 mg Oral Daily  . atorvastatin  10 mg Oral QHS  . calcitRIOL      . calcitRIOL  1 mcg Oral Q M,W,F-HD  . Chlorhexidine Gluconate Cloth  6 each Topical Q0600  . Chlorhexidine Gluconate Cloth  6 each Topical Q0600  . cinacalcet  30 mg Oral  Q supper  . ciprofloxacin  500 mg Oral Q1200  . [START ON 10/25/2018] darbepoetin (ARANESP) injection - DIALYSIS  60 mcg Intravenous Q Fri-HD  . feeding supplement  1 Container Oral BID BM  . feeding supplement (PRO-STAT SUGAR FREE 64)  30 mL Oral BID  . hydrocerin   Topical BID  . midodrine  10 mg Oral BID WC  . multivitamin  1 tablet Oral QHS  . sodium chloride flush  3 mL Intravenous Q12H  . sucroferric oxyhydroxide  1,000 mg Oral TID WC  . thiamine  100 mg Oral Daily   Elmarie Shiley, MD 10/23/2018, 9:44 AM

## 2018-10-23 NOTE — Progress Notes (Signed)
Hypoglycemic Event  CBG: 28  Treatment: OJ with sugar, pudding  Symptoms: none  Follow-up CBG: Time:1902 CBG Result: 59  Possible Reasons for Event: poor volume intake  Comments/MD notified: Dr. Trey Paula P Nahuel Wilbert

## 2018-10-24 ENCOUNTER — Inpatient Hospital Stay (HOSPITAL_COMMUNITY): Payer: Medicare Other

## 2018-10-24 ENCOUNTER — Encounter (HOSPITAL_COMMUNITY): Payer: Self-pay | Admitting: Radiology

## 2018-10-24 DIAGNOSIS — Z7189 Other specified counseling: Secondary | ICD-10-CM

## 2018-10-24 DIAGNOSIS — Z515 Encounter for palliative care: Secondary | ICD-10-CM

## 2018-10-24 LAB — GLUCOSE, CAPILLARY
Glucose-Capillary: 28 mg/dL — CL (ref 70–99)
Glucose-Capillary: 108 mg/dL — ABNORMAL HIGH (ref 70–99)
Glucose-Capillary: 99 mg/dL (ref 70–99)
Glucose-Capillary: 107 mg/dL — ABNORMAL HIGH (ref 70–99)
Glucose-Capillary: 91 mg/dL (ref 70–99)
Glucose-Capillary: 82 mg/dL (ref 70–99)
Glucose-Capillary: 76 mg/dL (ref 70–99)
Glucose-Capillary: 58 mg/dL — ABNORMAL LOW (ref 70–99)
Glucose-Capillary: 96 mg/dL (ref 70–99)
Glucose-Capillary: 74 mg/dL (ref 70–99)
Glucose-Capillary: 82 mg/dL (ref 70–99)

## 2018-10-24 NOTE — Progress Notes (Signed)
Daughter took home patients purse and personal belongings at the request of the patient.

## 2018-10-24 NOTE — Progress Notes (Signed)
Patient ID: Jessica Clarke, female   DOB: [birth year], 68 y.o.   MRN: [id number]   [facility] Progress Note   Assessment/ Plan:   1.  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  Stable hemoglobin and hematocrit overnight and on midodrine for blood pressure support.  2. ESRD: Continue hemodialysis on a Monday/Wednesday/Friday schedule with heparin-free dialysis tomorrow if still here.  3.  Hyponatremia/hypoalbuminemia: Discussed the importance of fluid restriction, low-sodium diet and protein intake. UF with HD today.  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 5.  History of spiculated nodule right upper lobe: Ongoing follow-up with oncology 6. Hypotension: low without evidence of significant blood loss or sepsis- now on midodrine. 7.  C. difficile antigen positive: On enteric precautions and supportive management while on ciprofloxacin.  Subjective:   Reports decreased diarrhea, no rectal bleeding and denies any nausea or vomiting.  Noted to be hypoglycemic overnight-surprisingly asymptomatic.   Objective:   BP (!) 98/59 (BP Location: Right Arm)   Pulse 81   Temp 98.4 F (36.9 C) (Oral)   Resp 18   Ht 5\' 11"  (1.803 m)   Wt 71.9 kg   SpO2 94%   BMI 22.11 kg/m   Physical Exam: Gen: Comfortably resting in bed, watching television. CVS: Pulse regular rhythm, normal rate, S1 and S2 normal Resp: Anteriorly clear to auscultation, no rales/rhonchi.  Hypopigmentation left upper chest noted Abd: Soft, mild tenderness over left lower quadrant, bowel sounds normal Ext: Trace bilateral lower extremity edema.  Left upper arm arteriovenous fistula palpable thrill.  Labs: BMET Recent Labs  Lab 10/18/18 1750  10/19/18 0550 10/20/18 0536 10/20/18 1256 10/21/18 0637 10/22/18 0330 10/23/18 0500 10/23/18 1816  NA 131*  --  134* 132*  --   132* 130* 126* 135  K 3.7  --  3.3* 4.4  --  3.6 3.6 3.8 3.8  CL 94*  --  96* 98  --  98 97* 94* 100  CO2 25  --  26 20*  --  24 25 24 27   GLUCOSE 79   < > 108* 85 77 87 91 83 128*  BUN 18  --  9 15  --  22 15 29* 16  CREATININE 5.10*  --  3.37* 4.21*  --  5.20* 3.39* 4.49* 2.85*  CALCIUM 8.7*  --  8.8* 8.2*  --  7.6* 7.4* 7.5* 7.8*  PHOS 4.1  --   --   --   --  2.6  --  2.5  --    < > = values in this interval not displayed.   CBC Recent Labs  Lab 10/20/18 0536 10/21/18 0637 10/22/18 0330 10/23/18 0552  WBC 7.1 6.6 6.0 5.7  NEUTROABS 4.7 4.9 4.3 3.9  HGB 11.0* 10.1* 9.6* 9.5*  HCT 32.9* 31.0* 29.6* 29.1*  MCV 96.2 97.8 98.3 98.3  PLT 98* 100* 61* 76*   Medications:    . allopurinol  100 mg Oral Daily  . atorvastatin  10 mg Oral QHS  . calcitRIOL  1 mcg Oral Q M,W,F-HD  . Chlorhexidine Gluconate Cloth  6 each Topical Q0600  . Chlorhexidine Gluconate Cloth  6 each Topical Q0600  . cinacalcet  30 mg Oral Q supper  . ciprofloxacin  500 mg Oral Q1200  . [START ON 10/25/2018] darbepoetin (ARANESP) injection - DIALYSIS  60 mcg Intravenous Q Fri-HD  . feeding supplement  1  Container Oral BID BM  . feeding supplement (PRO-STAT SUGAR FREE 64)  30 mL Oral BID  . hydrocerin   Topical BID  . midodrine  10 mg Oral BID WC  . multivitamin  1 tablet Oral QHS  . sodium chloride flush  3 mL Intravenous Q12H  . sucroferric oxyhydroxide  1,000 mg Oral TID WC  . thiamine  100 mg Oral Daily   Elmarie Shiley, MD 10/24/2018, 9:10 AM

## 2018-10-24 NOTE — Plan of Care (Signed)
Progressing towards goals

## 2018-10-24 NOTE — TOC Progression Note (Signed)
Transition of Care [facility]) - Progression Note    Patient Details  Name: Jessica Clarke MRN: [id number] Date of Birth: [birth year]  Transition of [facility]) CM/SW Contact  [name], RN Phone Number: 10/24/2018, 3:47 PM  Clinical Narrative:    CM met with patient and daughter: Jessica Clarke today. Pt will go to Jessica Clarke's home at d/c: [place], [place] [phone] CM went over with daughter the need for a close watch on patients blood sugars at d/c.  Pt will need 3 in 1 at d/c for home. TOC following.  Expected Discharge Plan: [place] Barriers to Discharge: Continued Medical Work up  Expected Discharge Plan and Services Expected Discharge Plan: [place]   Discharge Planning Services: CM Consult Post Acute Care Choice: Home Health, Durable Medical Equipment                                         Social Determinants of Health (SDOH) Interventions    Readmission Risk Interventions No flowsheet data found.

## 2018-10-24 NOTE — Progress Notes (Signed)
PROGRESS NOTE    Jessica Clarke  PJA:[id number] DOB: April 26, 1950 DOA: 10/17/2018 PCP: Glendale Chard, MD   Brief Narrative:  68 year old lady with prior h/o lung cancer, hypertension, CAD, ESRD ON HD, diverticulosis, anemia, diastolic CHF, presents to ED WITH complaints of diarrhea and rectal bleeding. She underwent CT of the abdomen showing Stable edematous changes in the ascending colon consistent with colitis although the mild ascites and third spacing of fluid may contribute to this abnormality. She was discharged on oral augmentin. Pt presents to ED with similar complaints and hematochezia and severe anemia.  She was found to have colitis and acute GI bleed. GI consulted ad she underwent colonoscopy showing diverticulosis.  She continues to have diarrhea.   Assessment & Plan:   Principal Problem:   Symptomatic anemia Active Problems:   Hypertension   Smoker   ESRD on dialysis ([place])   Diarrhea   Rectal bleeding   Polyp of colon   Symptomatic anemia sec to lower GI bleed:  S/p 3 units of prbc transfusion and repeat H&H is around 9. Gi consulted, underwent colonoscopy on 9/6 showing diverticulosis in the sigmoid , descending and ascending colon.  Pathology shows tubular adenoma.  No further bleeding seen.     Enteropathogenic e coli colitis.  GI panel shows E COLI.  Complete 5 days of antibiotics, on ciprofloxacin.   Pt continues to have diarrhea loose, though she is afebrile and no leukocytosis.    Syncope:  Sec to symptomatic anemia.  Echocardiogram showed LVEF 60 TO 34%, diastolic parameters consistent with impaired relaxation.  CT head NEGATIVE.  Pt 's daughter at bedside reports that she has been lethargic, with slow speech, memory deficits and multiple falls and syncope, hence MRI of the brain ordered for further evaluation.     Hypoglycemia: Sec to poor oral intake. Please continue with cbgs every 2 hours.  Get hgba1c.  Her cbg's are better today.     Hypotension:  Normal lactic acid.  Afebrile, no leukocytosis.  Probably from HD. Midodrine added by nephrology.    Hypertension;  bp parameters improved.    H/o lung cancer with nodule in thr right upper lobe:  Recommend out pateint follow up with Dr Julien Nordmann.  Pt not a surgical candidate as per CT Surgery.     Tobacco abuse:  Cessation counseling given.  Continue with nicotine patch.   ESRD: ON HD.  As per nephrology.   Hyponatremia, hypoalbuminemia:  Continue with low salt diet and fluid restriction.    In view of multiple medical problems. Poor oral intake, persistent hypoglycemia, clinical deterioration, deconditioning, palliative care consulted for goals of care.    DVT prophylaxis:SCD'S Code Status: full code.  Family Communication: none at bedside.  Disposition Plan: pending clinical improvement and palliative care consult.    Consultants:   Nephrology  Palliative care.    Procedures:  None.    Antimicrobials: ciprofloxacin.    Subjective: No new complaints. Wants to go home  Daughter at bedside.  Looking forward to talking to palliative care.   Objective: Vitals:   10/24/18 0700 10/24/18 0846 10/24/18 0900 10/24/18 1100  BP: (!) 74/40 (!) 98/59  106/60  Pulse: 82 81  74  Resp:  18  17  Temp: 98.4 F (36.9 C)   98.3 F (36.8 C)  TempSrc: Oral   Oral  SpO2: 94%   92%  Weight:   78.2 kg   Height:   5\' 11"  (1.803 m)     Intake/Output  Summary (Last 24 hours) at 10/24/2018 1724 Last data filed at 10/24/2018 0900 Gross per 24 hour  Intake 876 ml  Output -  Net 876 ml   Filed Weights   10/23/18 0820 10/23/18 1200 10/24/18 0900  Weight: 73.5 kg 71.9 kg 78.2 kg    Examination:  General exam: alert and not in distress.  Respiratory system: clear to auscultation, no wheezing or rhonchi.  Cardiovascular system: s1s2, RRR, no JVD.  Gastrointestinal system: abdomen is soft , non tender non distended bowel sounds okay.  Central nervous  system: alert , oriented to person only. Not to time . Grossly non focal.  Extremities: no pedal edema, cyanosis or clubbing.  Skin: No rashes,  Psychiatry:  Mood & affect appropriate.     Data Reviewed: I have personally reviewed following labs and imaging studies  CBC: Recent Labs  Lab 10/19/18 0550 10/20/18 0536 10/21/18 0637 10/22/18 0330 10/23/18 0552  WBC 5.6 7.1 6.6 6.0 5.7  NEUTROABS 4.2 4.7 4.9 4.3 3.9  HGB 10.1* 11.0* 10.1* 9.6* 9.5*  HCT 31.1* 32.9* 31.0* 29.6* 29.1*  MCV 98.7 96.2 97.8 98.3 98.3  PLT 98* 98* 100* 61* 76*   Basic Metabolic Panel: Recent Labs  Lab 10/18/18 0500  10/18/18 1750  10/20/18 0536 10/20/18 1256 10/21/18 0637 10/22/18 0330 10/23/18 0500 10/23/18 1816  NA 133*  --  131*   < > 132*  --  132* 130* 126* 135  K 2.8*  --  3.7   < > 4.4  --  3.6 3.6 3.8 3.8  CL 94*  --  94*   < > 98  --  98 97* 94* 100  CO2 29  --  25   < > 20*  --  24 25 24 27   GLUCOSE 77   < > 79   < > 85 77 87 91 83 128*  BUN 17  --  18   < > 15  --  22 15 29* 16  CREATININE 4.63*  --  5.10*   < > 4.21*  --  5.20* 3.39* 4.49* 2.85*  CALCIUM 8.8*  --  8.7*   < > 8.2*  --  7.6* 7.4* 7.5* 7.8*  MG 1.8  --   --   --   --   --   --   --   --   --   PHOS  --   --  4.1  --   --   --  2.6  --  2.5  --    < > = values in this interval not displayed.   GFR: Estimated Creatinine Clearance: 20.8 mL/min (A) (by C-G formula based on SCr of 2.85 mg/dL (H)). Liver Function Tests: Recent Labs  Lab 10/18/18 0500  10/19/18 0550 10/20/18 0536 10/21/18 0637 10/22/18 0330 10/23/18 0500  AST 22  --  22 QUANTITY NOT SUFFICIENT, UNABLE TO PERFORM TEST 18 19  --   ALT 11  --  10 QUANTITY NOT SUFFICIENT, UNABLE TO PERFORM TEST 12 10  --   ALKPHOS 130*  --  140* 132* 115 117  --   BILITOT 1.8*  --  1.6* QUANTITY NOT SUFFICIENT, UNABLE TO PERFORM TEST 1.3* 1.1  --   PROT 6.7  --  7.0 6.2* 6.1* 5.9*  --   ALBUMIN 2.4*   < > 2.3* 2.1* 1.9* 1.9* 1.9*   < > = values in this interval not  displayed.   No results for input(s): LIPASE, AMYLASE  in the last 168 hours. No results for input(s): AMMONIA in the last 168 hours. Coagulation Profile: No results for input(s): INR, PROTIME in the last 168 hours. Cardiac Enzymes: No results for input(s): CKTOTAL, CKMB, CKMBINDEX, TROPONINI in the last 168 hours. BNP (last 3 results) No results for input(s): PROBNP in the last 8760 hours. HbA1C: No results for input(s): HGBA1C in the last 72 hours. CBG: Recent Labs  Lab 10/24/18 0805 10/24/18 1031 10/24/18 1215 10/24/18 1408 10/24/18 1524  GLUCAP 91 82 76 58* 96   Lipid Profile: No results for input(s): CHOL, HDL, LDLCALC, TRIG, CHOLHDL, LDLDIRECT in the last 72 hours. Thyroid Function Tests: No results for input(s): TSH, T4TOTAL, FREET4, T3FREE, THYROIDAB in the last 72 hours. Anemia Panel: No results for input(s): VITAMINB12, FOLATE, FERRITIN, TIBC, IRON, RETICCTPCT in the last 72 hours. Sepsis Labs: Recent Labs  Lab 10/22/18 1106  LATICACIDVEN 1.2    Recent Results (from the past 240 hour(s))  SARS CORONAVIRUS 2 (TAT 6-24 HRS) Nasopharyngeal Nasopharyngeal Swab     Status: None   Collection Time: 10/18/18 12:05 AM   Specimen: Nasopharyngeal Swab  Result Value Ref Range Status   SARS Coronavirus 2 NEGATIVE NEGATIVE Final    Comment: (NOTE) SARS-CoV-2 target nucleic acids are NOT DETECTED. The SARS-CoV-2 RNA is generally detectable in upper and lower respiratory specimens during the acute phase of infection. Negative results do not preclude SARS-CoV-2 infection, do not rule out co-infections with other pathogens, and should not be used as the sole basis for treatment or other patient management decisions. Negative results must be combined with clinical observations, patient history, and epidemiological information. The expected result is Negative. Fact Sheet for Patients: SugarRoll.be Fact Sheet for Healthcare Providers:  https://www.woods-mathews.com/ This test is not yet approved or cleared by the [place] FDA and  has been authorized for detection and/or diagnosis of SARS-CoV-2 by FDA under an Emergency Use Authorization (EUA). This EUA will remain  in effect (meaning this test can be used) for the duration of the COVID-19 declaration under Section 56 4(b)(1) of the Act, 21 U.S.C. section 360bbb-3(b)(1), unless the authorization is terminated or revoked sooner. Performed at [facility], [place] [address] [place], [place] [address]   Culture, blood (routine x 2)     Status: None   Collection Time: 10/18/18 12:10 PM   Specimen: BLOOD RIGHT HAND  Result Value Ref Range Status   Specimen Description BLOOD RIGHT HAND  Final   Special Requests   Final    BOTTLES DRAWN AEROBIC ONLY Blood Culture adequate volume   Culture   Final    NO GROWTH 5 DAYS Performed at [facility], [place] [address], [place], [place] [address]    Report Status 10/23/2018 FINAL  Final  Gastrointestinal Panel by PCR , Stool     Status: Abnormal   Collection Time: 10/19/18 10:11 AM   Specimen: Stool  Result Value Ref Range Status   Campylobacter species NOT DETECTED NOT DETECTED Final   Plesimonas shigelloides NOT DETECTED NOT DETECTED Final   Salmonella species NOT DETECTED NOT DETECTED Final   Yersinia enterocolitica NOT DETECTED NOT DETECTED Final   Vibrio species NOT DETECTED NOT DETECTED Final   Vibrio cholerae NOT DETECTED NOT DETECTED Final   Enteroaggregative E coli (EAEC) NOT DETECTED NOT DETECTED Final   Enteropathogenic E coli (EPEC) DETECTED (A) NOT DETECTED Final    Comment: RESULT CALLED TO, READ BACK BY AND VERIFIED WITH: LAURYN BROWN AT 1330 10/20/2018.PMF  Enterotoxigenic E coli (ETEC) NOT DETECTED NOT DETECTED Final   Shiga like toxin producing E coli (STEC) NOT DETECTED NOT DETECTED Final   Shigella/Enteroinvasive E coli (EIEC) NOT DETECTED NOT DETECTED Final   Cryptosporidium  NOT DETECTED NOT DETECTED Final   Cyclospora cayetanensis NOT DETECTED NOT DETECTED Final   Entamoeba histolytica NOT DETECTED NOT DETECTED Final   Giardia lamblia NOT DETECTED NOT DETECTED Final   Adenovirus F40/41 NOT DETECTED NOT DETECTED Final   Astrovirus NOT DETECTED NOT DETECTED Final   Norovirus GI/GII NOT DETECTED NOT DETECTED Final   Rotavirus A NOT DETECTED NOT DETECTED Final   Sapovirus (I, II, IV, and V) NOT DETECTED NOT DETECTED Final    Comment: Performed at [facility], [address] [place], [place] [address]  C Difficile Quick Screen w PCR reflex     Status: Abnormal   Collection Time: 10/19/18  4:05 PM   Specimen: Stool  Result Value Ref Range Status   C Diff antigen POSITIVE (A) NEGATIVE Final   C Diff toxin NEGATIVE NEGATIVE Final   C Diff interpretation Results are indeterminate. See PCR results.  Final    Comment: Performed at [facility], [place] [address], [place], [place] [address]  C. Diff by PCR, Reflexed     Status: None   Collection Time: 10/19/18  4:05 PM  Result Value Ref Range Status   Toxigenic C. Difficile by PCR NEGATIVE NEGATIVE Final    Comment: Patient is colonized with non toxigenic C. difficile. May not need treatment unless significant symptoms are present. Performed at [facility], [place] [address] [place], [place] [address]   Culture, blood (routine x 2)     Status: None (Preliminary result)   Collection Time: 10/22/18 11:06 AM   Specimen: BLOOD RIGHT ARM  Result Value Ref Range Status   Specimen Description BLOOD RIGHT ARM  Final   Special Requests   Final    BOTTLES DRAWN AEROBIC ONLY Blood Culture adequate volume   Culture   Final    NO GROWTH 1 DAY Performed at [facility], [place] [address], [place], [place] [address]    Report Status PENDING  Incomplete  Culture, blood (routine x 2)     Status: None (Preliminary result)   Collection Time: 10/22/18 11:06 AM   Specimen: BLOOD RIGHT ARM  Result Value Ref  Range Status   Specimen Description BLOOD RIGHT ARM  Final   Special Requests   Final    BOTTLES DRAWN AEROBIC ONLY Blood Culture results may not be optimal due to an inadequate volume of blood received in culture bottles   Culture   Final    NO GROWTH 1 DAY Performed at [facility], [place] [address] [place], [place] [address]    Report Status PENDING  Incomplete         Radiology Studies: Mr Brain Wo Contrast  Result Date: 10/24/2018 CLINICAL DATA:  Encephalopathy. Altered level of consciousness. History of lung cancer. EXAM: MRI HEAD WITHOUT CONTRAST TECHNIQUE: Multiplanar, multiecho pulse sequences of the brain and surrounding structures were obtained without intravenous contrast. COMPARISON:  CT head 10/19/2018 FINDINGS: Brain: Mild atrophy. Negative for hydrocephalus. Negative for acute infarct. Several tiny white matter hyper intensities bilaterally. Negative for hemorrhage or mass. No evidence of metastatic disease on unenhanced images. Vascular: Normal arterial flow voids. Skull and upper cervical spine: Negative Sinuses/Orbits: Mild mucosal edema paranasal sinuses.  Normal orbit Other: None IMPRESSION: No acute abnormality.  No acute infarct or metastatic disease Atrophy and minimal chronic white matter change. Electronically Signed   By: Franchot Gallo M.D.   On: 10/24/2018 16:42        Scheduled Meds: . allopurinol  100 mg Oral Daily  . atorvastatin  10 mg Oral QHS  . calcitRIOL  1 mcg Oral Q M,W,F-HD  . Chlorhexidine Gluconate Cloth  6 each Topical Q0600  . Chlorhexidine Gluconate Cloth  6 each Topical Q0600  . cinacalcet  30 mg Oral Q supper  . ciprofloxacin  500 mg Oral Q1200  . [START ON 10/25/2018] darbepoetin (ARANESP) injection - DIALYSIS  60 mcg Intravenous Q Fri-HD  . feeding supplement  1 Container Oral BID BM  . feeding supplement (PRO-STAT SUGAR FREE 64)  30 mL Oral BID  . hydrocerin   Topical BID  . midodrine  10 mg Oral BID WC  . multivitamin  1  tablet Oral QHS  . sodium chloride flush  3 mL Intravenous Q12H  . sucroferric oxyhydroxide  1,000 mg Oral TID WC  . thiamine  100 mg Oral Daily   Continuous Infusions: . sodium chloride       LOS: 6 days    Time spent: 38 minutes.     Hosie Poisson, MD [facility] Pager [phone] If 7PM-7AM, please contact night-coverage [email or web address] Password [facility] 10/24/2018, 5:24 PM

## 2018-10-24 NOTE — Consult Note (Signed)
Consultation Note Date: 10/24/2018   Patient Name: Jessica Clarke  DOB: 1950-10-11  MRN: [id number]  Age / Sex: 68 y.o., female   PCP: Glendale Chard, MD Referring Physician: Hosie Poisson, MD   REASON FOR CONSULTATION:Establishing goals of care  Palliative Care consult requested for this 68 y.o. female with multiple medical problems including hypertension, hyperlipidemia, ESRD on HD (MWF), CHF, CAD, and lung cancer. Jessica Clarke presented to ED with complaints of diarrhea with rectal bleeding. She originally checked into the ED for evaluation and later went to find something to eat at [facility] were she was found on the floor. During her ED work-up AST 21, ALT 10, albumin 2.5, FOBT negative. CT of abdomen/pelvis showed colonic diverticulosis, ascending colitis, may be infectious or inflammatory, and advanced aortic atherosclerosis. Since admission patient continues on her HD schedule. She is being followed by GI and underwent a colonoscopy on 9/6 showing diverticulosis in the sigmoid , descending and ascending colon. Pathology results showed tubular adenoma. She has received 3 units of PRBC since admission. Patient also was found to have a suspicious lung nodule in December 2019 which was worked up and found to be stage 1a non-small cell lung cancer Julien Nordmann). Patient was last seen 03/05/18 and had not returned for further follow up. Palliative Medicine team consulted for goals of care.   Clinical Assessment and Goals of Care: I have reviewed medical records including lab results, imaging, Epic notes, and MAR, received report from the bedside RN, and assessed the patient. I met at the bedside with Jessica Clarke to discuss diagnosis prognosis, [place], EOL wishes, disposition and options. No family at the bedside. Patient is awake, alert & oriented x3. I offered to involve her daughter in discussion however, patient declined and stated she could provide her daughter with updates as she is planning to visit later  this evening.   I introduced Palliative Medicine as specialized medical care for people living with serious illness. It focuses on providing relief from the symptoms and stress of a serious illness. The goal is to improve quality of life for both the patient and the family.   We discussed a brief life review of the patient, along with her functional and nutritional status. 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 She lives alone with her main support provided by her only child, her daughter Jessica Clarke as well as her 3 grandchildren. She is of [place] faith.   Prior to admission she reports being able to perform all ADLs independently. She reports being on HD for many years. She was driving herself to her appointments and treatments prior to admission. She states her appetite was ok but has declined over the past few months as she feels is related to her recent illness. She does endorse several falls over the past few months due to weakness.   We discussed Her current illness and what it means in the larger context of Her on-going co-morbidities. With specific discussions regarding her anemia, GI bleed, hypoglycemic episodes, and untreated lung cancer. Natural disease trajectory and expectations at EOL were discussed. Patient verbalized understanding of her current illness.   She states she has been on HD for many years with no intentions of discontinuing treatment. She asks if treating her lung cancer would cause complications for her HD. I educate patient that she would need to first follow up with Dr. Julien Nordmann and further discuss what treatment options and recommended and can then evaluate from there. I did educate patient  without appropriate follow up she is at risk for further progression. She verbalizes she was intending to follow up however she failed to make a follow up appointment and then COVID caused further delay as she made her priority of only going to HD and back home again. She  reports her daughter has been fussing with her regarding her negligence and she is not intentionally ignoring as her goal is to live as long as she can to be here for her daughter and grandchildren. Patient endorses that she still smokes cigarettes at least 0.5pk/day. Discussed tobacco cessation. She verbalized understanding and agreement.   I attempted to elicit values and goals of care important to the patient.    Her expressed wishes are to continue with full aggressive medical interventions.   Jessica Clarke states she does not have an advance directive. She states she does not have many assets and her daughter is the only child. She reports her daughter would be her medical decision maker in case of an emergency. I offered assistance in completing directives while hospitalized, however she declined stating this was not necessary.   I discussed her current full code status with consideration to her current medical condition and co-morbidities. She states her wishes are for full code full aggressive interventions. I discussed what a code scenario would look like and she verbalized understanding. She states "I have to be here for my grandchildren. If I am in a vegetative state my daughter knows I don't want to live on machines my life and will make the decision when the time comes!"   Palliative Care services outpatient were explained and offered. Patient verbalized understanding and awareness of both palliative's goals and philosophy of care. She verbalized wishes for outpatient palliative support.   Patient states she knows that there was a recommendation for SNF however she has discussed with her daughter and she is not interested in going to a facility. She reports her daughter states she will assist in her home with home health if recommended.   Questions and concerns were addressed.  Hard Choices booklet left for review. Patient was encouraged to call with questions or concerns.  PMT will continue  to support holistically.   SOCIAL HISTORY:     reports that she has been smoking cigarettes. She has a 15.00 pack-year smoking history. She has never used smokeless tobacco. She reports current alcohol use of about 6.0 standard drinks of alcohol per week. She reports that she does not use drugs.  CODE STATUS: Full code  ADVANCE DIRECTIVES: Next of Kin    SYMPTOM MANAGEMENT: per attending   Palliative Prophylaxis:   Aspiration, Bowel Regimen and Frequent Pain Assessment  PSYCHO-SOCIAL/SPIRITUAL:  Support System: Family   Desire for further Chaplaincy support:No   Additional Recommendations (Limitations, Scope, Preferences):  Full Scope Treatment   PAST MEDICAL HISTORY: Past Medical History:  Diagnosis Date  . Abnormal Pap smear    ASCUS ?HGSIL  . Anemia   . Cervical dysplasia   . CHF (congestive heart failure) ([place])   . Chronic kidney disease   . Constipation   . Constipation   . GERD (gastroesophageal reflux disease)   . Gout   . Hyperlipidemia   . Hypertension   . Myocardial infarction ([place]) 2015  . Obesity   . Pneumonia   . Uterine polyp     PAST SURGICAL HISTORY:  Past Surgical History:  Procedure Laterality Date  . AV FISTULA PLACEMENT  01/18/2012   Procedure: ARTERIOVENOUS (AV) FISTULA  CREATION;  Surgeon: Angelia Mould, MD;  Location: [place];  Service: Vascular;  Laterality: Left;  . BIOPSY  10/20/2018   Procedure: BIOPSY;  Surgeon: Thornton Park, MD;  Location: [place];  Service: Gastroenterology;;  . COLONOSCOPY WITH PROPOFOL N/A 10/20/2018   Procedure: COLONOSCOPY WITH PROPOFOL;  Surgeon: Thornton Park, MD;  Location: [place];  Service: Gastroenterology;  Laterality: N/A;  . DILATION AND CURETTAGE OF UTERUS    . HYSTEROSCOPY    . LEEP    . LEFT HEART CATHETERIZATION WITH CORONARY ANGIOGRAM N/A 08/26/2013   Procedure: LEFT HEART CATHETERIZATION WITH CORONARY ANGIOGRAM;  Surgeon: Leonie Man, MD;  Location: [facility] CATH LAB;   Service: Cardiovascular;  Laterality: N/A;  . POLYPECTOMY  10/20/2018   Procedure: POLYPECTOMY;  Surgeon: Thornton Park, MD;  Location: [place];  Service: Gastroenterology;;  . REVISON OF ARTERIOVENOUS FISTULA Left 6/38/7564   Procedure: PLICATION OF ARTERIOVENOUS FISTULA;  Surgeon: Conrad Newcomb, MD;  Location: [place];  Service: Vascular;  Laterality: Left;    ALLERGIES:  has No Known Allergies.   MEDICATIONS:  Current Facility-Administered Medications  Medication Dose Route Frequency Provider Last Rate Last Dose  . 0.9 %  sodium chloride infusion  250 mL Intravenous PRN Jani Gravel, MD      . acetaminophen (TYLENOL) tablet 650 mg  650 mg Oral Q6H PRN Jani Gravel, MD   650 mg at 10/23/18 2155   Or  . acetaminophen (TYLENOL) suppository 650 mg  650 mg Rectal Q6H PRN Jani Gravel, MD      . allopurinol (ZYLOPRIM) tablet 100 mg  100 mg Oral Daily Jani Gravel, MD   100 mg at 10/24/18 0932  . atorvastatin (LIPITOR) tablet 10 mg  10 mg Oral Loma Sousa, MD   10 mg at 10/23/18 2146  . calcitRIOL (ROCALTROL) capsule 1 mcg  1 mcg Oral Q M,W,F-HD Valentina Gu, NP   1 mcg at 10/23/18 0900  . Chlorhexidine Gluconate Cloth 2 % PADS 6 each  6 each Topical Q0600 Valentina Gu, NP   6 each at 10/18/18 0800  . Chlorhexidine Gluconate Cloth 2 % PADS 6 each  6 each Topical Q0600 Roney Jaffe, MD   6 each at 10/22/18 0617  . cinacalcet (SENSIPAR) tablet 30 mg  30 mg Oral Q supper Valentina Gu, NP   30 mg at 10/23/18 1744  . ciprofloxacin (CIPRO) tablet 500 mg  500 mg Oral Q1200 Alma Friendly, MD   500 mg at 10/23/18 1247  . [START ON 10/25/2018] Darbepoetin Alfa (ARANESP) injection 60 mcg  60 mcg Intravenous Q Fri-HD Valentina Gu, NP      . feeding supplement (BOOST / RESOURCE BREEZE) liquid 1 Container  1 Container Oral BID BM Alma Friendly, MD   1 Container at 10/24/18 0935  . feeding supplement (PRO-STAT SUGAR FREE 64) liquid 30 mL  30 mL Oral BID Jani Gravel, MD   30 mL at 10/24/18 0935  . hydrocerin (EUCERIN) cream   Topical BID Alma Friendly, MD      . midodrine (PROAMATINE) tablet 10 mg  10 mg Oral BID WC Alma Friendly, MD   10 mg at 10/24/18 0932  . multivitamin (RENA-VIT) tablet 1 tablet  1 tablet Oral QHS Valentina Gu, NP   1 tablet at 10/23/18 2147  . nicotine (NICODERM CQ - dosed in mg/24 hours) patch 14 mg  14 mg Transdermal Daily PRN Jani Gravel, MD      .  ondansetron (ZOFRAN) injection 4 mg  4 mg Intravenous Q6H PRN Schorr, Rhetta Mura, NP   4 mg at 10/22/18 0025  . sodium chloride flush (NS) 0.9 % injection 3 mL  3 mL Intravenous Q12H Jani Gravel, MD   3 mL at 10/24/18 0934  . sodium chloride flush (NS) 0.9 % injection 3 mL  3 mL Intravenous PRN Jani Gravel, MD      . sucroferric oxyhydroxide [facility]) chewable tablet 1,000 mg  1,000 mg Oral TID WC Jani Gravel, MD   1,000 mg at 10/24/18 1024  . thiamine (VITAMIN B-1) tablet 100 mg  100 mg Oral Daily Jani Gravel, MD   100 mg at 10/24/18 0935    VITAL SIGNS: BP (!) 98/59 (BP Location: Right Arm)   Pulse 81   Temp 98.4 F (36.9 C) (Oral)   Resp 18   Ht _0  (1.803 m)   Wt 78.2 kg   SpO2 94%   BMI 24.04 kg/m  Filed Weights   10/23/18 0820 10/23/18 1200 10/24/18 0900  Weight: 73.5 kg 71.9 kg 78.2 kg    Estimated body mass index is 24.04 kg/m as calculated from the following:   Height as of this encounter: _1  (1.803 m).   Weight as of this encounter: 78.2 kg.  LABS: CBC:    Component Value Date/Time   WBC 5.7 10/23/2018 0552   HGB 9.5 (L) 10/23/2018 0552   HGB 11.5 (L) 02/04/2018 1507   HCT 29.1 (L) 10/23/2018 0552   PLT 76 (L) 10/23/2018 0552   PLT 100 (L) 02/04/2018 1507   Comprehensive Metabolic Panel:    Component Value Date/Time   NA 135 10/23/2018 1816   K 3.8 10/23/2018 1816   CO2 27 10/23/2018 1816   BUN 16 10/23/2018 1816   CREATININE 2.85 (H) 10/23/2018 1816   CREATININE 4.40 (HH) 02/04/2018 1507   ALBUMIN 1.9 (L)  10/23/2018 0500     Review of Systems  Neurological: Positive for weakness.  Unless otherwise noted, a complete review of systems is negative.  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    Prognosis: Unable to determine (Guarded to Poor) in the setting of GI bleed, symptomatic anemia, untreated lung cancer, hypertension, ESRD on HD, tobacco use, hyponatremia, albumin 1.9, hypotension, tobacco use, and hypoglycemia.   Discharge Planning:  To Be Determined with outpatient palliative.   Recommendations:  Full code-as confirmed by patient  Continue with current plan of care per medical team  Patient states she is not interested in SNF placement and plans to go home with her daughter.   Requesting outpatient palliative (referral placed)  PMT will continue to support and follow as needed. Please call. I will be off service returning on Monday. On-call via [place] can be notified.    Palliative Performance Scale: PPS 30%               Patient expressed understanding and was in agreement with this plan.   Thank you for allowing the Palliative Medicine Team to assist in the care of this patient.  Time In: 1000 Time Out: 1050 Time Total: 50 min.   Visit consisted of counseling and education dealing with the complex and emotionally intense issues of symptom management and palliative care in the setting of serious and  potentially life-threatening illness.Greater than 50%  of this time was spent counseling and coordinating care related to the above assessment and plan.  Signed by:  Alda Lea, AGPCNP-BC Palliative Medicine Team  Phone: [phone] Fax: [phone] Pager: [phone] Amion: Bjorn Pippin

## 2018-10-24 NOTE — TOC Initial Note (Signed)
Transition of Care [facility]) - Initial/Assessment Note    Patient Details  Name: Jessica Clarke MRN: [id number] Date of Birth: August 31, 1950  Transition of Care [facility]) CM/SW Contact:    [name], RN Phone Number: 10/24/2018, 3:46 PM  Clinical Narrative:                 [facility] selected for [facility] services. Tiffany with [facility] accepted the referral.  Daughter is able to provide 24 hour supervision with other family support.  Expected Discharge Plan: [place] Barriers to Discharge: Continued Medical Work up   Patient Goals and CMS Choice   CMS Medicare.gov Compare Post Acute Care list provided to:: Patient Represenative (must comment) Choice offered to / list presented to : Adult Children  Expected Discharge Plan and Services Expected Discharge Plan: [place]   Discharge Planning Services: CM Consult Post Acute Care Choice: Home Health, Durable Medical Equipment                                        Prior Living Arrangements/Services   Lives with:: Self Patient language and need for interpreter reviewed:: Yes(no needs) Do you feel safe going back to the place where you live?: Yes      Need for Family Participation in Patient Care: Yes (Comment)(24 hour supervision) Care giver support system in place?: Yes (comment)(pt is going to daughters home at d/c)   Criminal Activity/Legal Involvement Pertinent to Current Situation/Hospitalization: No - Comment as needed  Activities of Daily Living Home Assistive Devices/Equipment: None ADL Screening (condition at time of admission) Patient's cognitive ability adequate to safely complete daily activities?: Yes Is the patient deaf or have difficulty hearing?: No Does the patient have difficulty seeing, even when wearing glasses/contacts?: No Does the patient have difficulty concentrating, remembering, or making decisions?: No Patient able to express need for assistance with ADLs?:  Yes Does the patient have difficulty dressing or bathing?: No Independently performs ADLs?: Yes (appropriate for developmental age) Does the patient have difficulty walking or climbing stairs?: No Weakness of Legs: None Weakness of Arms/Hands: None  Permission Sought/Granted                  Emotional Assessment Appearance:: Appears stated age Attitude/Demeanor/Rapport: Engaged Affect (typically observed): Accepting Orientation: : Oriented to Self, Oriented to Place   Psych Involvement: No (comment)  Admission diagnosis:  Diarrhea Patient Active Problem List   Diagnosis Date Noted  . Polyp of colon   . Diarrhea 10/18/2018  . Rectal bleeding 10/18/2018  . Symptomatic anemia 10/17/2018  . Lung nodule < 6cm on CT 03/05/2018  . Malnutrition of moderate degree 08/28/2016  . Respiratory distress 08/26/2016  . ESRD on dialysis ([place]) 06/08/2015  . CAD- non obstructive RI disease 08/26/13 08/27/2013  . NSTEMI-08/25/13- non obstructive CAD at cath 08/26/13 08/26/2013  . Chronic combined systolic and diastolic heart failure ([place]) 08/26/2013  . Alcohol abuse 08/25/2013  . Respiratory failure with hypoxia and hypercapnia ([place]) 08/25/2013  . Flash pulmonary edema ([place]) 08/25/2013  . Encounter for adequacy testing for hemodialysis ([place]) 06/26/2012  . End stage renal disease ([place]) 12/27/2011  . ASCUS (atypical squamous cells of undetermined significance) on Pap smear 09/09/2010  . Hypertension 09/09/2010  . Cervical polyp 09/09/2010  . Smoker 09/09/2010  . Post-menopausal bleeding 09/09/2010  . Gout 09/09/2010   PCP:  Glendale Chard,  MD Pharmacy:   [facility], [place] - [address] [place] [address] Phone: [phone] Fax: [phone]  [facility] - [place], [place] - [address] [employer] [address], [address] [address] Phone:  [phone] Fax: [phone]     Social Determinants of Health (SDOH) Interventions    Readmission Risk Interventions No flowsheet data found.

## 2018-10-25 LAB — CBC
HCT: 26.3 % — ABNORMAL LOW (ref 36.0–46.0)
Hemoglobin: 8.5 g/dL — ABNORMAL LOW (ref 12.0–15.0)
MCH: 32.1 pg (ref 26.0–34.0)
MCHC: 32.3 g/dL (ref 30.0–36.0)
MCV: 99.2 fL (ref 80.0–100.0)
Platelets: 95 10*3/uL — ABNORMAL LOW (ref 150–400)
RBC: 2.65 MIL/uL — ABNORMAL LOW (ref 3.87–5.11)
RDW: 16.1 % — ABNORMAL HIGH (ref 11.5–15.5)
WBC: 5.4 10*3/uL (ref 4.0–10.5)
nRBC: 0 % (ref 0.0–0.2)

## 2018-10-25 LAB — GLUCOSE, CAPILLARY
Glucose-Capillary: 17 mg/dL — CL (ref 70–99)
Glucose-Capillary: 42 mg/dL — CL (ref 70–99)
Glucose-Capillary: 59 mg/dL — ABNORMAL LOW (ref 70–99)
Glucose-Capillary: 75 mg/dL (ref 70–99)
Glucose-Capillary: 76 mg/dL (ref 70–99)
Glucose-Capillary: 83 mg/dL (ref 70–99)
Glucose-Capillary: 92 mg/dL (ref 70–99)
Glucose-Capillary: 93 mg/dL (ref 70–99)
Glucose-Capillary: 97 mg/dL (ref 70–99)
Glucose-Capillary: 99 mg/dL (ref 70–99)

## 2018-10-25 LAB — RENAL FUNCTION PANEL
Albumin: 1.9 g/dL — ABNORMAL LOW (ref 3.5–5.0)
Anion gap: 9 (ref 5–15)
BUN: 30 mg/dL — ABNORMAL HIGH (ref 8–23)
CO2: 24 mmol/L (ref 22–32)
Calcium: 7.8 mg/dL — ABNORMAL LOW (ref 8.9–10.3)
Chloride: 98 mmol/L (ref 98–111)
Creatinine, Ser: 4.59 mg/dL — ABNORMAL HIGH (ref 0.44–1.00)
GFR calc Af Amer: 11 mL/min — ABNORMAL LOW (ref >60)
GFR calc non Af Amer: 9 mL/min — ABNORMAL LOW (ref >60)
Glucose, Bld: 76 mg/dL (ref 70–99)
Phosphorus: 2.2 mg/dL — ABNORMAL LOW (ref 2.5–4.6)
Potassium: 4.4 mmol/L (ref 3.5–5.1)
Sodium: 131 mmol/L — ABNORMAL LOW (ref 135–145)

## 2018-10-25 LAB — HEMOGLOBIN A1C
Hgb A1c MFr Bld: 4.9 % (ref 4.8–5.6)
Mean Plasma Glucose: 94 mg/dL

## 2018-10-25 MED ORDER — DARBEPOETIN ALFA 60 MCG/0.3ML IJ SOSY
PREFILLED_SYRINGE | INTRAMUSCULAR | Status: AC
  Administered 2018-10-25: 60 ug via INTRAVENOUS
  Filled 2018-10-25: qty 0.3

## 2018-10-25 MED ORDER — CALCITRIOL 0.5 MCG PO CAPS
ORAL_CAPSULE | ORAL | Status: AC
  Administered 2018-10-25: 1 ug via ORAL
  Filled 2018-10-25: qty 2

## 2018-10-25 MED ORDER — MIDODRINE HCL 5 MG PO TABS
ORAL_TABLET | ORAL | Status: AC
  Filled 2018-10-25: qty 2

## 2018-10-25 NOTE — Progress Notes (Addendum)
PROGRESS NOTE    Jessica Clarke  OHY:[id number] DOB: 12/06/1950 DOA: 10/17/2018 PCP: Glendale Chard, MD   Brief Narrative:  68 year old lady with prior h/o lung cancer, hypertension, CAD, ESRD ON HD, diverticulosis, anemia, diastolic CHF, presents to ED WITH complaints of diarrhea and rectal bleeding. She underwent CT of the abdomen showing Stable edematous changes in the ascending colon consistent with colitis although the mild ascites and third spacing of fluid may contribute to this abnormality. She was discharged on oral augmentin. Pt presents to ED with similar complaints and hematochezia and severe anemia.  She was found to have colitis and acute GI bleed. GI consulted ad she underwent colonoscopy showing diverticulosis.  Completed 5 days of ciprofloxacin. No diarrhea at this morning.  Currently waiting for disposition waiting for hospital bed and other equipment to be delivered So she can be discharged home.  Assessment & Plan:   Principal Problem:   Symptomatic anemia Active Problems:   Hypertension   Smoker   ESRD on dialysis ([place])   Diarrhea   Rectal bleeding   Polyp of colon   Symptomatic anemia sec to lower GI bleed:  S/p 3 units of prbc transfusion and repeat H&H is around 8.5,  Gi consulted, underwent colonoscopy on 9/6 showing diverticulosis in the sigmoid , descending and ascending colon.  Pathology shows tubular adenoma.  No further bleeding seen.  Discussed the results of pathology with the patient's daughter.    Enteropathogenic E Coli colitis Completed 5 days of antibiotics, on ciprofloxacin.   She is afebrile and WBC count within normal limits.  No diarrhea today.   Syncope:  Sec to symptomatic anemia.  Echocardiogram showed LVEF 60 TO 94%, diastolic parameters consistent with impaired relaxation.  CT head negative and MRI of the brain does not show any acute intracranial pathology. Pt 's daughter at bedside reports that she has been lethargic, with slow  speech, memory deficits and multiple falls and syncope,. In view of the multiple medical problems, deconditioning and clinical deterioration palliative care consulted.    Hypoglycemia: mostly on the days of HD.  Sec to poor oral intake. Please continue with cbgs every 2 hours.   hgba1c is 4.9.     Hypotension:  Normal lactic acid.  Afebrile, no leukocytosis.  Probably from HD. Midodrine added by nephrology.  One episode of hypotension during HD.  Resolved and asymptomatic.     H/o lung cancer with nodule in thr right upper lobe:  Recommend out pateint follow up with Dr Julien Nordmann.  Pt not a surgical candidate as per CT Surgery.     Tobacco abuse:  Cessation counseling given.  Continue with nicotine patch.   ESRD: ON HD.  As per nephrology.   Hyponatremia, hypoalbuminemia:  From ESRD.  Continue with low salt diet and fluid restriction.    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  Currently no evidence of bleeding and platelets have been stable around 5000.  In view of multiple medical problems. Poor oral intake, persistent hypoglycemia, clinical deterioration, deconditioning, palliative care consulted for goals of care.  Appreciate palliative recommendations. Continue with aggressive management at this time as per the patient's wishes.    DVT prophylaxis:SCD'S Code Status: full code.  Family Communication: none at bedside.  Disposition Plan: pending clinical improvement and palliative care consult.    Consultants:   Nephrology  Palliative care.    Procedures:  None.    Antimicrobials: ciprofloxacin.    Subjective:  No new complaints today wants to go home.  No diarrhea.  No nausea or vomiting.  Objective: Vitals:   10/25/18 1030 10/25/18 1100 10/25/18 1118 10/25/18 1157  BP: (!) 101/50 (!) 99/56 (!) 107/59 116/64  Pulse: 82 76 80 75  Resp:   16 16  Temp:   98.3 F (36.8 C) 98.3  F (36.8 C)  TempSrc:   Oral Oral  SpO2:   100% 100%  Weight:   77.2 kg   Height:        Intake/Output Summary (Last 24 hours) at 10/25/2018 1735 Last data filed at 10/25/2018 1208 Gross per 24 hour  Intake 720 ml  Output 1203 ml  Net -483 ml   Filed Weights   10/24/18 0900 10/25/18 0500 10/25/18 1118  Weight: 78.2 kg 78.4 kg 77.2 kg    Examination:  General exam: Alert and comfortable Respiratory system: Air to auscultation bilaterally, no wheezing or rhonchi Cardiovascular system: 1 S2 heard, regular rate rhythm, no JVD Gastrointestinal system: Diminished soft, nontender, nondistended, bowel sounds are good Central nervous system: Alert and oriented to person and place.  Grossly nonfocal Extremities: No pedal edema, cyanosis or clubbing Skin: No rashes,  Psychiatry: Mood is appropriate    Data Reviewed: I have personally reviewed following labs and imaging studies  CBC: Recent Labs  Lab 10/19/18 0550 10/20/18 0536 10/21/18 0637 10/22/18 0330 10/23/18 0552 10/25/18 0746  WBC 5.6 7.1 6.6 6.0 5.7 5.4  NEUTROABS 4.2 4.7 4.9 4.3 3.9  --   HGB 10.1* 11.0* 10.1* 9.6* 9.5* 8.5*  HCT 31.1* 32.9* 31.0* 29.6* 29.1* 26.3*  MCV 98.7 96.2 97.8 98.3 98.3 99.2  PLT 98* 98* 100* 61* 76* 95*   Basic Metabolic Panel: Recent Labs  Lab 10/18/18 1750  10/21/18 0637 10/22/18 0330 10/23/18 0500 10/23/18 1816 10/25/18 0746  NA 131*   < > 132* 130* 126* 135 131*  K 3.7   < > 3.6 3.6 3.8 3.8 4.4  CL 94*   < > 98 97* 94* 100 98  CO2 25   < > 24 25 24 27 24   GLUCOSE 79   < > 87 91 83 128* 76  BUN 18   < > 22 15 29* 16 30*  CREATININE 5.10*   < > 5.20* 3.39* 4.49* 2.85* 4.59*  CALCIUM 8.7*   < > 7.6* 7.4* 7.5* 7.8* 7.8*  PHOS 4.1  --  2.6  --  2.5  --  2.2*   < > = values in this interval not displayed.   GFR: Estimated Creatinine Clearance: 12.9 mL/min (A) (by C-G formula based on SCr of 4.59 mg/dL (H)). Liver Function Tests: Recent Labs  Lab 10/19/18 0550 10/20/18  0536 10/21/18 0637 10/22/18 0330 10/23/18 0500 10/25/18 0746  AST 22 QUANTITY NOT SUFFICIENT, UNABLE TO PERFORM TEST 18 19  --   --   ALT 10 QUANTITY NOT SUFFICIENT, UNABLE TO PERFORM TEST 12 10  --   --   ALKPHOS 140* 132* 115 117  --   --   BILITOT 1.6* QUANTITY NOT SUFFICIENT, UNABLE TO PERFORM TEST 1.3* 1.1  --   --   PROT 7.0 6.2* 6.1* 5.9*  --   --   ALBUMIN 2.3* 2.1* 1.9* 1.9* 1.9* 1.9*   No results for input(s): LIPASE, AMYLASE in the last 168 hours. No results for input(s): AMMONIA in the last 168 hours. Coagulation Profile: No results for input(s): INR, PROTIME in the last 168 hours. Cardiac Enzymes: No results  for input(s): CKTOTAL, CKMB, CKMBINDEX, TROPONINI in the last 168 hours. BNP (last 3 results) No results for input(s): PROBNP in the last 8760 hours. HbA1C: Recent Labs    10/23/18 0552  HGBA1C 4.9   CBG: Recent Labs  Lab 10/25/18 0559 10/25/18 1155 10/25/18 1406 10/25/18 1601 10/25/18 1708  GLUCAP 75 59* 42* 17* 99   Lipid Profile: No results for input(s): CHOL, HDL, LDLCALC, TRIG, CHOLHDL, LDLDIRECT in the last 72 hours. Thyroid Function Tests: No results for input(s): TSH, T4TOTAL, FREET4, T3FREE, THYROIDAB in the last 72 hours. Anemia Panel: No results for input(s): VITAMINB12, FOLATE, FERRITIN, TIBC, IRON, RETICCTPCT in the last 72 hours. Sepsis Labs: Recent Labs  Lab 10/22/18 1106  LATICACIDVEN 1.2    Recent Results (from the past 240 hour(s))  SARS CORONAVIRUS 2 (TAT 6-24 HRS) Nasopharyngeal Nasopharyngeal Swab     Status: None   Collection Time: 10/18/18 12:05 AM   Specimen: Nasopharyngeal Swab  Result Value Ref Range Status   SARS Coronavirus 2 NEGATIVE NEGATIVE Final    Comment: (NOTE) SARS-CoV-2 target nucleic acids are NOT DETECTED. The SARS-CoV-2 RNA is generally detectable in upper and lower respiratory specimens during the acute phase of infection. Negative results do not preclude SARS-CoV-2 infection, do not rule out  co-infections with other pathogens, and should not be used as the sole basis for treatment or other patient management decisions. Negative results must be combined with clinical observations, patient history, and epidemiological information. The expected result is Negative. Fact Sheet for Patients: SugarRoll.be Fact Sheet for Healthcare Providers: https://www.woods-mathews.com/ This test is not yet approved or cleared by the [place] FDA and  has been authorized for detection and/or diagnosis of SARS-CoV-2 by FDA under an Emergency Use Authorization (EUA). This EUA will remain  in effect (meaning this test can be used) for the duration of the COVID-19 declaration under Section 56 4(b)(1) of the Act, 21 U.S.C. section 360bbb-3(b)(1), unless the authorization is terminated or revoked sooner. Performed at [facility], [place] [address], [place], [place] [address]   Culture, blood (routine x 2)     Status: None   Collection Time: 10/18/18 12:10 PM   Specimen: BLOOD RIGHT HAND  Result Value Ref Range Status   Specimen Description BLOOD RIGHT HAND  Final   Special Requests   Final    BOTTLES DRAWN AEROBIC ONLY Blood Culture adequate volume   Culture   Final    NO GROWTH 5 DAYS Performed at [facility], [place] [address], [place], [place] [address]    Report Status 10/23/2018 FINAL  Final  Gastrointestinal Panel by PCR , Stool     Status: Abnormal   Collection Time: 10/19/18 10:11 AM   Specimen: Stool  Result Value Ref Range Status   Campylobacter species NOT DETECTED NOT DETECTED Final   Plesimonas shigelloides NOT DETECTED NOT DETECTED Final   Salmonella species NOT DETECTED NOT DETECTED Final   Yersinia enterocolitica NOT DETECTED NOT DETECTED Final   Vibrio species NOT DETECTED NOT DETECTED Final   Vibrio cholerae NOT DETECTED NOT DETECTED Final   Enteroaggregative E coli (EAEC) NOT DETECTED NOT DETECTED Final    Enteropathogenic E coli (EPEC) DETECTED (A) NOT DETECTED Final    Comment: RESULT CALLED TO, READ BACK BY AND VERIFIED WITH: LAURYN BROWN AT 1330 10/20/2018.PMF    Enterotoxigenic E coli (ETEC) NOT DETECTED NOT DETECTED Final   Shiga like toxin producing E coli (STEC) NOT DETECTED NOT DETECTED Final   Shigella/Enteroinvasive E  coli (EIEC) NOT DETECTED NOT DETECTED Final   Cryptosporidium NOT DETECTED NOT DETECTED Final   Cyclospora cayetanensis NOT DETECTED NOT DETECTED Final   Entamoeba histolytica NOT DETECTED NOT DETECTED Final   Giardia lamblia NOT DETECTED NOT DETECTED Final   Adenovirus F40/41 NOT DETECTED NOT DETECTED Final   Astrovirus NOT DETECTED NOT DETECTED Final   Norovirus GI/GII NOT DETECTED NOT DETECTED Final   Rotavirus A NOT DETECTED NOT DETECTED Final   Sapovirus (I, II, IV, and V) NOT DETECTED NOT DETECTED Final    Comment: Performed at [facility], [place] [address] [place], [place] [address]  C Difficile Quick Screen w PCR reflex     Status: Abnormal   Collection Time: 10/19/18  4:05 PM   Specimen: Stool  Result Value Ref Range Status   C Diff antigen POSITIVE (A) NEGATIVE Final   C Diff toxin NEGATIVE NEGATIVE Final   C Diff interpretation Results are indeterminate. See PCR results.  Final    Comment: Performed at [facility], [place] [address] [place], [place] [address]  C. Diff by PCR, Reflexed     Status: None   Collection Time: 10/19/18  4:05 PM  Result Value Ref Range Status   Toxigenic C. Difficile by PCR NEGATIVE NEGATIVE Final    Comment: Patient is colonized with non toxigenic C. difficile. May not need treatment unless significant symptoms are present. Performed at [facility], [place] [address], [place], [place] [address]   Culture, blood (routine x 2)     Status: None (Preliminary result)   Collection Time: 10/22/18 11:06 AM   Specimen: BLOOD RIGHT ARM  Result Value Ref Range Status   Specimen Description BLOOD RIGHT ARM  Final    Special Requests   Final    BOTTLES DRAWN AEROBIC ONLY Blood Culture adequate volume   Culture   Final    NO GROWTH 3 DAYS Performed at [facility], [address], [place], [place] [address]    Report Status PENDING  Incomplete  Culture, blood (routine x 2)     Status: None (Preliminary result)   Collection Time: 10/22/18 11:06 AM   Specimen: BLOOD RIGHT ARM  Result Value Ref Range Status   Specimen Description BLOOD RIGHT ARM  Final   Special Requests   Final    BOTTLES DRAWN AEROBIC ONLY Blood Culture results may not be optimal due to an inadequate volume of blood received in culture bottles   Culture   Final    NO GROWTH 3 DAYS Performed at [facility], [place] [address] [place], [place] [address]    Report Status PENDING  Incomplete         Radiology Studies: Mr Brain Wo Contrast  Result Date: 10/24/2018 CLINICAL DATA:  Encephalopathy. Altered level of consciousness. History of lung cancer. EXAM: MRI HEAD WITHOUT CONTRAST TECHNIQUE: Multiplanar, multiecho pulse sequences of the brain and surrounding structures were obtained without intravenous contrast. COMPARISON:  CT head 10/19/2018 FINDINGS: Brain: Mild atrophy. Negative for hydrocephalus. Negative for acute infarct. Several tiny white matter hyper intensities bilaterally. Negative for hemorrhage or mass. No evidence of metastatic disease on unenhanced images. Vascular: Normal arterial flow voids. Skull and upper cervical spine: Negative Sinuses/Orbits: Mild mucosal edema paranasal sinuses.  Normal orbit Other: None IMPRESSION: No acute abnormality.  No acute infarct or metastatic disease Atrophy and minimal chronic white matter change. Electronically Signed   By: Franchot Gallo M.D.   On: 10/24/2018 16:42  Scheduled Meds: . allopurinol  100 mg Oral Daily  . atorvastatin  10 mg Oral QHS  . calcitRIOL  1 mcg Oral Q M,W,F-HD  . Chlorhexidine Gluconate Cloth  6 each Topical Q0600  . Chlorhexidine  Gluconate Cloth  6 each Topical Q0600  . cinacalcet  30 mg Oral Q supper  . ciprofloxacin  500 mg Oral Q1200  . darbepoetin (ARANESP) injection - DIALYSIS  60 mcg Intravenous Q Fri-HD  . feeding supplement  1 Container Oral BID BM  . feeding supplement (PRO-STAT SUGAR FREE 64)  30 mL Oral BID  . hydrocerin   Topical BID  . midodrine      . midodrine  10 mg Oral BID WC  . multivitamin  1 tablet Oral QHS  . sodium chloride flush  3 mL Intravenous Q12H  . sucroferric oxyhydroxide  1,000 mg Oral TID WC  . thiamine  100 mg Oral Daily   Continuous Infusions: . sodium chloride       LOS: 7 days    Time spent: 28 minutes.     Hosie Poisson, MD [facility] Pager [phone] If 7PM-7AM, please contact night-coverage [email or web address] Password [facility] 10/25/2018, 5:35 PM

## 2018-10-25 NOTE — Progress Notes (Signed)
Patient has left the floor for dialysis.

## 2018-10-25 NOTE — Care Management (Cosign Needed)
    Durable Medical Equipment  (From admission, onward)         Start     Ordered   10/24/18 1643  For home use only DME Hospital bed  Once    Question Answer Comment  Length of Need Lifetime   Patient has (list medical condition): ESRD ON HD, diverticulosis, lung nodule.   The above medical condition requires: Patient requires the ability to reposition frequently   Head must be elevated greater than: 45 degrees   Bed type Semi-electric   Support Surface: Gel Overlay      10/24/18 1643

## 2018-10-25 NOTE — Progress Notes (Signed)
Patient ID: Jessica Clarke, female   DOB: Jun 19, 1950, 68 y.o.   MRN: [id number]  [facility] Progress Note   Assessment/ Plan:   1.  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  H/H lower this AM without overt blood loss.  2. ESRD: Continue hemodialysis on a Monday/Wednesday/Friday schedule with HD today.  3.  Hyponatremia/hypoalbuminemia: Discussed the importance of fluid restriction, low-sodium diet and protein intake. UF with HD today.  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 5.  History of spiculated nodule right upper lobe: Ongoing follow-up with oncology 6. Hypotension: low without evidence of significant blood loss or sepsis- now on midodrine. 7.  C. difficile antigen positive: On enteric precautions and supportive management while on ciprofloxacin.  Subjective:   Reports to be feeling fair and tolerating dialysis without problems. MRI brain results from yesterday reviewed- no mets/acute lesion.    Objective:   BP 114/61   Pulse 75   Temp 98.3 F (36.8 C) (Oral)   Resp 19   Ht 5\' 11"  (1.803 m)   Wt 78.4 kg   SpO2 96%   BMI 24.11 kg/m   Physical Exam: Gen: Comfortably resting in dialysis. CVS: Pulse regular rhythm, normal rate, S1 and S2 normal Resp: Anteriorly clear to auscultation, no rales/rhonchi.  Hypopigmentation of upper chest noted Abd: Soft, mild tenderness over left lower quadrant, bowel sounds normal Ext: Trace bilateral lower extremity edema.  Left upper arm arteriovenous fistula cannulated.  Labs: BMET Recent Labs  Lab 10/18/18 1750  10/19/18 0550 10/20/18 0536 10/20/18 1256 10/21/18 0637 10/22/18 0330 10/23/18 0500 10/23/18 1816 10/25/18 0746  NA 131*  --  134* 132*  --  132* 130* 126* 135 131*  K 3.7  --  3.3* 4.4  --  3.6 3.6 3.8 3.8 4.4  CL 94*  --  96* 98  --  98 97* 94* 100 98  CO2 25   --  26 20*  --  24 25 24 27 24   GLUCOSE 79   < > 108* 85 77 87 91 83 128* 76  BUN 18  --  9 15  --  22 15 29* 16 30*  CREATININE 5.10*  --  3.37* 4.21*  --  5.20* 3.39* 4.49* 2.85* 4.59*  CALCIUM 8.7*  --  8.8* 8.2*  --  7.6* 7.4* 7.5* 7.8* 7.8*  PHOS 4.1  --   --   --   --  2.6  --  2.5  --  2.2*   < > = values in this interval not displayed.   CBC Recent Labs  Lab 10/20/18 0536 10/21/18 0637 10/22/18 0330 10/23/18 0552 10/25/18 0746  WBC 7.1 6.6 6.0 5.7 5.4  NEUTROABS 4.7 4.9 4.3 3.9  --   HGB 11.0* 10.1* 9.6* 9.5* 8.5*  HCT 32.9* 31.0* 29.6* 29.1* 26.3*  MCV 96.2 97.8 98.3 98.3 99.2  PLT 98* 100* 61* 76* 95*   Medications:    . allopurinol  100 mg Oral Daily  . atorvastatin  10 mg Oral QHS  . calcitRIOL  1 mcg Oral Q M,W,F-HD  . Chlorhexidine Gluconate Cloth  6 each Topical Q0600  . Chlorhexidine Gluconate Cloth  6 each Topical Q0600  . cinacalcet  30 mg Oral Q supper  . ciprofloxacin  500 mg Oral Q1200  . darbepoetin (ARANESP) injection - DIALYSIS  60 mcg Intravenous Q Fri-HD  . feeding  supplement  1 Container Oral BID BM  . feeding supplement (PRO-STAT SUGAR FREE 64)  30 mL Oral BID  . hydrocerin   Topical BID  . midodrine  10 mg Oral BID WC  . multivitamin  1 tablet Oral QHS  . sodium chloride flush  3 mL Intravenous Q12H  . sucroferric oxyhydroxide  1,000 mg Oral TID WC  . thiamine  100 mg Oral Daily   Elmarie Shiley, MD 10/25/2018, 9:28 AM

## 2018-10-25 NOTE — Progress Notes (Signed)
Physical Therapy Treatment Patient Details Name: Jessica Clarke MRN: [id number] DOB: 07-Sep-1950 Today's Date: 10/25/2018    History of Present Illness 68 y.o. female pt presented to ED with C/O rectal bleeding, diarrhea for past few days. ESRD on HD MWF. PMH: HTN, scleroderma, gout, CAD, scattered diverticulosis, sessile rectal polyp, spiculated RUL nodule suspicious for non-small cell lung cancer (last seen by oncology 03/05/2018), current smoker, AOCD, SHPT.  Was seen in ED for rectal bleeding 09/29/2018.    PT Comments    Patient needs max encouragement to participate but eventually agreeable to mobilize. Pt requires min guard for OOB mobility this session. Pt ambulated 150 ft with no AD and ascended/descended 2 steps with hand rails. Pt reports that she will likely d/c to her daughter's house. Continue to progress as tolerated.    Follow Up Recommendations  Home health PT;Supervision/Assistance - 24 hour(if family cant provide 24/7 then may need SNF)     Equipment Recommendations  3in1 (PT)    Recommendations for Other Services       Precautions / Restrictions Precautions Precautions: Fall Restrictions Weight Bearing Restrictions: No    Mobility  Bed Mobility Overal bed mobility: Modified Independent Bed Mobility: Supine to Sit;Sit to Supine           General bed mobility comments: increased time and effort   Transfers Overall transfer level: Needs assistance Equipment used: None Transfers: Sit to/from Stand Sit to Stand: Min guard         General transfer comment: min guard for safety  Ambulation/Gait Ambulation/Gait assistance: Min guard Gait Distance (Feet): 150 Feet Assistive device: None Gait Pattern/deviations: Decreased stride length;Step-through pattern;Wide base of support Gait velocity: decreased   General Gait Details: mildly unsteady with minor drifting R/L but no LOB or need for physical assist or UE support    Stairs Stairs: Yes Stairs  assistance: Min guard Stair Management: Two rails;Alternating pattern;Forwards Number of Stairs: 2     Wheelchair Mobility    Modified Rankin (Stroke Patients Only)       Balance Overall balance assessment: Needs assistance   Sitting balance-Leahy Scale: Good     Standing balance support: During functional activity Standing balance-Leahy Scale: Fair                              Cognition Arousal/Alertness: Awake/alert Behavior During Therapy: WFL for tasks assessed/performed Overall Cognitive Status: No family/caregiver present to determine baseline cognitive functioning Area of Impairment: Safety/judgement;Problem solving                         Safety/Judgement: Decreased awareness of deficits;Decreased awareness of safety   Problem Solving: Slow processing;Requires verbal cues General Comments: seems HOH; lots of repetition of cues; pt needs alot of encouragement to participate with pt stating "i don't want to get up" and "I want to watch judge judy"; it's difficult to assess cognition vs pt not wanting to participate      Exercises      General Comments        Pertinent Vitals/Pain Pain Assessment: No/denies pain    Home Living                      Prior Function            PT Goals (current goals can now be found in the care plan section) Acute Rehab PT Goals Patient Stated Goal:  to go home Progress towards PT goals: Progressing toward goals    Frequency    Min 3X/week      PT Plan Current plan remains appropriate    Co-evaluation              AM-PAC PT "6 Clicks" Mobility   Outcome Measure  Help needed turning from your back to your side while in a flat bed without using bedrails?: A Little Help needed moving from lying on your back to sitting on the side of a flat bed without using bedrails?: A Little Help needed moving to and from a bed to a chair (including a wheelchair)?: A Little Help needed  standing up from a chair using your arms (e.g., wheelchair or bedside chair)?: A Little Help needed to walk in hospital room?: A Little Help needed climbing 3-5 steps with a railing? : A Little 6 Click Score: 18    End of Session Equipment Utilized During Treatment: Gait belt Activity Tolerance: Patient tolerated treatment well Patient left: with call bell/phone within reach;in bed;with bed alarm set Nurse Communication: Mobility status PT Visit Diagnosis: Unsteadiness on feet (R26.81);Difficulty in walking, not elsewhere classified (R26.2);Muscle weakness (generalized) (M62.81)     Time: 2683-4196 PT Time Calculation (min) (ACUTE ONLY): 32 min  Charges:  $Gait Training: 23-37 mins                     Earney Navy, PTA Acute Rehabilitation Services Pager: [phone] Office: [phone]     Darliss Cheney 10/25/2018, 3:29 PM

## 2018-10-25 NOTE — Plan of Care (Signed)
Progressing towards goals

## 2018-10-25 NOTE — Progress Notes (Signed)
Physical Therapy Cancellation Note   10/25/18 0904  PT Visit Information  Last PT Received On 10/25/18  Reason Eval/Treat Not Completed Patient at procedure or test/unavailable. Pt in HD. PT will continue to follow acutely.   Earney Navy, PTA Acute Rehabilitation Services Pager: [phone] Office: [phone]

## 2018-10-25 NOTE — Procedures (Signed)
Patient seen on Hemodialysis. BP 114/61   Pulse 75   Temp 98.3 F (36.8 C) (Oral)   Resp 19   Ht 5\' 11"  (1.803 m)   Wt 78.4 kg   SpO2 96%   BMI 24.11 kg/m   QB 400, UF goal 1.5L Tolerating treatment without complaints at this time.   Elmarie Shiley MD [facility]. Office # [phone] Pager # [phone] 9:28 AM

## 2018-10-26 DIAGNOSIS — I959 Hypotension, unspecified: Secondary | ICD-10-CM

## 2018-10-26 LAB — GLUCOSE, CAPILLARY
Glucose-Capillary: 70 mg/dL (ref 70–99)
Glucose-Capillary: 77 mg/dL (ref 70–99)
Glucose-Capillary: 78 mg/dL (ref 70–99)
Glucose-Capillary: 86 mg/dL (ref 70–99)
Glucose-Capillary: 87 mg/dL (ref 70–99)
Glucose-Capillary: 87 mg/dL (ref 70–99)

## 2018-10-26 MED ORDER — CALCITRIOL 0.5 MCG PO CAPS: 1.0000 ug | ORAL_CAPSULE | ORAL | 0 refills | Status: AC

## 2018-10-26 MED ORDER — MIDODRINE HCL 10 MG PO TABS: 10.0000 mg | ORAL_TABLET | Freq: Two times a day (BID) | ORAL | 0 refills | Status: AC

## 2018-10-26 MED ORDER — HYDROCERIN EX CREA: 1.0000 "application " | TOPICAL_CREAM | Freq: Two times a day (BID) | CUTANEOUS | 0 refills | Status: DC

## 2018-10-26 MED ORDER — CINACALCET HCL 30 MG PO TABS: 30.0000 mg | ORAL_TABLET | Freq: Every day | ORAL | 0 refills | Status: AC

## 2018-10-26 MED ORDER — PRO-STAT SUGAR FREE PO LIQD: 30.0000 mL | Freq: Two times a day (BID) | ORAL | 0 refills | Status: AC

## 2018-10-26 MED ORDER — RENA-VITE PO TABS: 1.0000 | ORAL_TABLET | Freq: Every day | ORAL | 0 refills | Status: AC

## 2018-10-26 MED ORDER — NICOTINE 14 MG/24HR TD PT24: 14.0000 mg | MEDICATED_PATCH | Freq: Every day | TRANSDERMAL | 0 refills | Status: DC | PRN

## 2018-10-26 MED ORDER — DARBEPOETIN ALFA 60 MCG/0.3ML IJ SOSY: 60.0000 ug | PREFILLED_SYRINGE | INTRAMUSCULAR | Status: AC

## 2018-10-26 NOTE — TOC Progression Note (Signed)
Transition of Care [facility]) - Progression Note    Patient Details  Name: Isley Weisheit MRN: [id number] Date of Birth: 1950-11-09  Transition of Care [facility]) CM/SW [place],  Phone Number: 10/26/2018, 9:03 AM  Clinical Narrative:  CSW notified by RN that daughter had not been contacted about the hospital bed yet. CSW reached out to [employer] to see if they could deliver hospital beds, and they initially said they could but then called back and said they could not. Also notified CSW that the narrative note was not completed. CSW asked CM to place narrative note for hospital bed. CSW contacted [facility] [employer] to request hospital bed and 3N1 delivered. CSW indicated that patient will be going to daughter [name]'s house at discharge and CSW provided [facility] with [name]'s phone number and address to facilitate delivery. [facility] to try to deliver equipment tonight.  [name] indicated that the patient could have a place to sleep for one night if the bed was not delivered tonight, but would prefer if the bed is delivered prior to patient discharging home.   Patient referral sent to [facility] for outpatient palliative follow-up.     Expected Discharge Plan: [place] Barriers to Discharge: Continued Medical Work up  Expected Discharge Plan and Services Expected Discharge Plan: [place]   Discharge Planning Services: CM Consult Post Acute Care Choice: Home Health, Durable Medical Equipment                                         Social Determinants of Health (SDOH) Interventions    Readmission Risk Interventions No flowsheet data found.

## 2018-10-26 NOTE — Plan of Care (Signed)
Plan of care adequate for discharge.

## 2018-10-26 NOTE — Progress Notes (Signed)
Patient ID: Jessica Clarke, female   DOB: 10/10/1950, 68 y.o.   MRN: [id number]  [facility] Progress Note   Assessment/ Plan:   1.  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  She does not have any further hematochezia and remains hemodynamically stable following earlier PRBC transfusions.  2. ESRD: Continue hemodialysis on a Monday/Wednesday/Friday schedule upon discharge.  Underwent hemodialysis yesterday without problems. 3.  Hyponatremia/hypoalbuminemia: Reiterated the importance of low-sodium diet/fluid restriction while maintaining liberal protein intake.  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 5.  History of spiculated nodule right upper lobe: Ongoing follow-up with oncology, not a surgical candidate 6. Hypotension: low without evidence of significant blood loss or sepsis- now on midodrine.  Without evidence of sepsis.  Subjective:   She had a loose bowel movement yesterday however, none overnight/this morning.  Denies blood in stool.   Objective:   BP 106/68 (BP Location: Right Arm)   Pulse 75   Temp 98.3 F (36.8 C) (Axillary)   Resp 16   Ht 5\' 11"  (1.803 m)   Wt 77.2 kg   SpO2 100%   BMI 23.74 kg/m   Physical Exam: Gen: Comfortably resting in bed, eating breakfast. CVS: Pulse regular rhythm, normal rate, S1 and S2 normal Resp: Anteriorly clear to auscultation, no rales/rhonchi.  Hypopigmentation of upper chest noted/arm Abd: Soft, mild tenderness over left lower quadrant, bowel sounds normal Ext: Trace bilateral lower extremity edema superimposed on chronic lymphedema.  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  Labs: BMET Recent Labs  Lab 10/20/18 0536 10/20/18 1256 10/21/18 0637 10/22/18 0330 10/23/18 0500 10/23/18 1816 10/25/18 0746  NA 132*  --  132* 130* 126* 135 131*  K 4.4  --   3.6 3.6 3.8 3.8 4.4  CL 98  --  98 97* 94* 100 98  CO2 20*  --  24 25 24 27 24   GLUCOSE 85 77 87 91 83 128* 76  BUN 15  --  22 15 29* 16 30*  CREATININE 4.21*  --  5.20* 3.39* 4.49* 2.85* 4.59*  CALCIUM 8.2*  --  7.6* 7.4* 7.5* 7.8* 7.8*  PHOS  --   --  2.6  --  2.5  --  2.2*   CBC Recent Labs  Lab 10/20/18 0536 10/21/18 0637 10/22/18 0330 10/23/18 0552 10/25/18 0746  WBC 7.1 6.6 6.0 5.7 5.4  NEUTROABS 4.7 4.9 4.3 3.9  --   HGB 11.0* 10.1* 9.6* 9.5* 8.5*  HCT 32.9* 31.0* 29.6* 29.1* 26.3*  MCV 96.2 97.8 98.3 98.3 99.2  PLT 98* 100* 61* 76* 95*   Medications:    . allopurinol  100 mg Oral Daily  . atorvastatin  10 mg Oral QHS  . calcitRIOL  1 mcg Oral Q M,W,F-HD  . Chlorhexidine Gluconate Cloth  6 each Topical Q0600  . Chlorhexidine Gluconate Cloth  6 each Topical Q0600  . cinacalcet  30 mg Oral Q supper  . darbepoetin (ARANESP) injection - DIALYSIS  60 mcg Intravenous Q Fri-HD  . feeding supplement  1 Container Oral BID BM  . feeding supplement (PRO-STAT SUGAR FREE 64)  30 mL Oral BID  . hydrocerin   Topical BID  . midodrine  10 mg Oral BID WC  . multivitamin  1 tablet Oral QHS  . sodium chloride flush  3 mL Intravenous Q12H  . sucroferric oxyhydroxide  1,000 mg Oral TID WC  .  thiamine  100 mg Oral Daily   Elmarie Shiley, MD 10/26/2018, 9:25 AM

## 2018-10-26 NOTE — Progress Notes (Signed)
Patient being discharged home.  Patient to be transported by her daughter.  IV removed with the catheter intact.  Discharge instructions and prescription information given to the patient and her daughter who verbalized understanding.

## 2018-10-26 NOTE — Discharge Summary (Signed)
Physician Discharge Summary  Jessica Clarke FKC:[id number] DOB: 07-Sep-1950 DOA: 10/17/2018  PCP: Glendale Chard, MD  Admit date: 10/17/2018 Discharge date: 10/26/2018  Admitted From: Home.  Disposition:  HOme with palliative care services to follow up.   Recommendations for Outpatient Follow-up:  1. Follow up with PCP in 1-2 weeks 2. Please obtain BMP/CBC in one week 3. Please follow up with Dr Julien Nordmann in one week for evaluation of the lung cancer.  4. Please follow up with palliative care services on discharge.  5. Please follow up with GI regarding the tubular adenoma.  6. please follow up with nephrology as recommended.   Home Health : yes.  Discharge Condition: guarded.  CODE STATUS:full code.  Diet recommendation: Heart Healthy   Brief/Interim Summary: 68 year old lady with prior h/o lung cancer, hypertension, CAD, ESRD ON HD, diverticulosis, anemia, diastolic CHF, presents to ED WITH complaints of diarrhea and rectal bleeding. She underwent CT of the abdomen showing Stable edematous changes in the ascending colon consistent with colitis although the mild ascites and third spacing of fluid may contribute to this abnormality. She was discharged on oral augmentin. Pt presents to ED with similar complaints and hematochezia and severe anemia.  She was found to have colitis and acute GI bleed. GI consulted ad she underwent colonoscopy showing diverticulosis.  Completed 5 days of ciprofloxacin. No diarrhea at this morning.  Currently waiting for disposition waiting for hospital bed and other equipment to be delivered So she can be discharged home.   Discharge Diagnoses:  Principal Problem:   Symptomatic anemia Active Problems:   Hypertension   Smoker   ESRD on dialysis ([place])   Diarrhea   Rectal bleeding   Polyp of colon  Symptomatic anemia sec to lower GI bleed:  S/p 3 units of prbc transfusion and repeat H&H is around 8.5,  Gi consulted, underwent colonoscopy on 9/6 showing  diverticulosis in the sigmoid , descending and ascending colon.  Pathology shows tubular adenoma.  No further bleeding seen.  Discussed the results of pathology with the patient's daughter. Recommend outpatient follow up with Dr Tarri Glenn in the office.     Enteropathogenic E Coli colitis Completed 5 days of antibiotics, on ciprofloxacin.   She is afebrile and WBC count within normal limits.  No diarrhea today.   Syncope:  Sec to symptomatic anemia.  Echocardiogram showed LVEF 60 TO 74%, diastolic parameters consistent with impaired relaxation.  CT head negative and MRI of the brain does not show any acute intracranial pathology. Pt 's daughter at bedside reports that she has been lethargic, with slow speech, memory deficits and multiple falls and syncope,. In view of the multiple medical problems, deconditioning and clinical deterioration palliative care consulted, recommended outpatient follow up at home on discharge.     Hypoglycemia: mostly on the days of HD.  Sec to poor oral intake. Please continue with cbgs every 2 hours.   hgba1c is 4.9.  CBG (last 3)  Recent Labs    10/26/18 0437 10/26/18 0611 10/26/18 0815  GLUCAP 77 70 78    encourage oral intake and multiple snacks between th emeals.    Hypotension:  Normal lactic acid.  Afebrile, no leukocytosis.  Probably from HD. Midodrine added by nephrology.      H/o lung cancer with nodule in thr right upper lobe:  Recommend out pateint follow up with Dr Julien Nordmann.  Pt not a surgical candidate as per CT Surgery.     Tobacco abuse:  Cessation counseling given.  Continue with nicotine patch.   ESRD: ON HD.  As per nephrology.   Hyponatremia, hypoalbuminemia:  From ESRD.  Continue with low salt diet and fluid restriction.    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  Currently no evidence of bleeding and platelets have  been stable around [address].  In view of multiple medical problems. Poor oral intake, persistent hypoglycemia, clinical deterioration, deconditioning, palliative care consulted for goals of care.  Appreciate palliative recommendations. Continue with aggressive management at this time as per the patient's wishes.   Discharge Instructions  Discharge Instructions    Diet - low sodium heart healthy   Complete by: As directed    Discharge instructions   Complete by: As directed    Please follow up with Dr Julien Nordmann regarding the lung nodule/ lung cancer in one week.  Please follow up with PCP in one week.  Please follow up with nephrology as recommended.     Allergies as of 10/26/2018   No Known Allergies     Medication List    STOP taking these medications   amoxicillin-clavulanate 500-125 MG tablet Commonly known as: Augmentin   atenolol 100 MG tablet Commonly known as: TENORMIN     TAKE these medications   acetaminophen 325 MG tablet Commonly known as: TYLENOL Take 325-650 mg by mouth every 8 (eight) hours as needed for mild pain.   allopurinol 100 MG tablet Commonly known as: ZYLOPRIM Take 100 mg by mouth daily.   aspirin EC 325 MG tablet Take 1 tablet (325 mg total) by mouth daily.   atorvastatin 10 MG tablet Commonly known as: LIPITOR Take 10 mg by mouth at bedtime.   calcitRIOL 0.5 MCG capsule Commonly known as: ROCALTROL Take 2 capsules (1 mcg total) by mouth every Monday, Wednesday, and Friday with hemodialysis. Start taking on: October 28, 2018   cinacalcet 30 MG tablet Commonly known as: SENSIPAR Take 1 tablet (30 mg total) by mouth daily with supper.   colchicine 0.6 MG tablet Take 0.6 mg by mouth daily as needed (as directed for gout flares or pain).   Darbepoetin Alfa 60 MCG/0.3ML Sosy injection Commonly known as: ARANESP Inject 0.3 mLs (60 mcg total) into the vein every Friday with hemodialysis. Start taking on: November 01, 2018   feeding  supplement (PRO-STAT SUGAR FREE 64) Liqd Take 30 mLs by mouth 2 (two) times daily.   midodrine 10 MG tablet Commonly known as: PROAMATINE Take 1 tablet (10 mg total) by mouth 2 (two) times daily with a meal.   multivitamin Tabs tablet Take 1 tablet by mouth at bedtime.   nicotine 14 mg/24hr patch Commonly known as: NICODERM CQ - dosed in mg/24 hours Place 1 patch (14 mg total) onto the skin daily as needed (smoking craving).   thiamine 100 MG tablet Commonly known as: VITAMIN B-1 Take 100 mg by mouth daily.   Velphoro 500 MG chewable tablet Generic drug: sucroferric oxyhydroxide Chew 2 tablets by mouth 3 (three) times daily with meals.            Durable Medical Equipment  (From admission, onward)         Start     Ordered   10/24/18 1643  For home use only DME Hospital bed  Once    Question Answer Comment  Length of Need Lifetime   Patient has (list medical condition): ESRD ON HD, diverticulosis, lung nodule.   The above medical condition requires: Patient requires  the ability to reposition frequently   Head must be elevated greater than: 45 degrees   Bed type Semi-electric   Support Surface: Gel Overlay      10/24/18 1643          No Known Allergies  Consultations:  Nephrology.   palliative care.    Procedures/Studies: Ct Abdomen Pelvis Wo Contrast  Result Date: 10/18/2018 CLINICAL DATA:  Follow-up colitis EXAM: CT ABDOMEN AND PELVIS WITHOUT CONTRAST TECHNIQUE: Multidetector CT imaging of the abdomen and pelvis was performed following the standard protocol without IV contrast. COMPARISON:  09/30/2018 FINDINGS: Lower chest: Small right-sided pleural effusion is noted which has increased slightly in the interval from the prior exam. No focal confluent infiltrate is seen. Cardiomegaly is noted. Hepatobiliary: No focal liver abnormality is seen. No gallstones, gallbladder wall thickening, or biliary dilatation. Pancreas: Unremarkable. No pancreatic ductal  dilatation or surrounding inflammatory changes. Spleen: Normal in size without focal abnormality. Adrenals/Urinary Tract: Adrenal glands are stable in appearance. The native kidneys are atrophic. The bladder is decompressed. No obstructive changes are seen. Stomach/Bowel: Diverticulosis is noted without evidence of definitive diverticulitis. No obstructive changes are seen. Persistent wall thickening in the ascending colon is noted again suspicious for colitis. The appendix is within normal limits. No small bowel abnormality is seen. The stomach is unremarkable. Vascular/Lymphatic: Aortic atherosclerosis. No enlarged abdominal or pelvic lymph nodes. Reproductive: Uterus and bilateral adnexa are unremarkable. Other: Mild ascites is noted as well as changes of anasarca these changes are similar to that seen on the prior exam and may contribute somewhat to the colonic edema. Musculoskeletal: Degenerative changes of the lumbar spine are noted. IMPRESSION: Stable edematous changes in the ascending colon consistent with colitis although the mild ascites and third spacing of fluid may contribute to this abnormality. Changes of anasarca and mild ascites. Slight increase in right-sided pleural effusion when compare with the prior exam. Electronically Signed   By: Inez Catalina M.D.   On: 10/18/2018 06:33   Ct Abdomen Pelvis Wo Contrast  Result Date: 09/30/2018 CLINICAL DATA:  GI bleed. Dialysis patient. EXAM: CT ABDOMEN AND PELVIS WITHOUT CONTRAST TECHNIQUE: Multidetector CT imaging of the abdomen and pelvis was performed following the standard protocol without IV contrast. COMPARISON:  PET CT 02/26/2018 FINDINGS: Lower chest: Cardiomegaly with trace pericardial effusion. Coronary artery calcifications. Small right pleural effusion. Interstitial thickening at the bases, chronic. Hepatobiliary: No evidence of focal hepatic lesion on noncontrast exam. There is periportal edema. Gallbladder is decompressed. No calcified  gallstone. Common bile duct not well-defined. Pancreas: No ductal dilatation. Generalized edema throughout the abdomen and pelvis without increase around the pancreas to suggest pancreatitis. Spleen: Normal in size without focal abnormality. Adrenals/Urinary Tract: Left adrenal thickening, stable. Normal right adrenal gland. Atrophic kidneys with small bilateral cysts. Urinary bladder is minimally distended but thick walled. Stomach/Bowel: Mild colonic wall thickening involving the cecum in ascending colon. Colonic diverticulosis most prominent distally. No diverticulitis. No obstruction, administered enteric contrast reaches the colon. No small bowel wall thickening or inflammatory change. Stomach is unremarkable. Vascular/Lymphatic: Advanced aortic atherosclerosis. No aneurysm. Limited assessment for adenopathy given lack of IV contrast, ascites, and generalized edema. No bulky abdominopelvic adenopathy. Reproductive: Uterine calcifications, otherwise unremarkable. No obvious adnexal mass. Other: Small volume abdominopelvic ascites. Generalized soft tissue edema, involving both mesentery and subcutaneous tissues. Asymmetric edema in the right greater than left lower abdominal wall. No free intra-abdominal air. Musculoskeletal: Findings of renal osteodystrophy with ground-glass increased bone density. Multiple Schmorl's nodes in the lumbar spine.  Degenerative disc disease at L5-S1. IMPRESSION: 1. Findings suggestive of ascending colitis, may be infectious or inflammatory. 2. Colonic diverticulosis without focal diverticulitis. 3. Third-spacing/volume overload with generalized soft tissue edema throughout the abdomen and pelvis with small volume abdominopelvic ascites. Small right pleural effusion has slightly increased compared to to prior PET. 4. Advanced aortic atherosclerosis.  Coronary artery calcifications. Aortic Atherosclerosis (ICD10-I70.0). Electronically Signed   By: Keith Rake M.D.   On: 09/30/2018  03:49   Ct Head Wo Contrast  Result Date: 10/19/2018 CLINICAL DATA:  Fall, hit head EXAM: CT HEAD WITHOUT CONTRAST TECHNIQUE: Contiguous axial images were obtained from the base of the skull through the vertex without intravenous contrast. COMPARISON:  None. FINDINGS: Brain: There is atrophy and chronic small vessel disease changes. No acute intracranial abnormality. Specifically, no hemorrhage, hydrocephalus, mass lesion, acute infarction, or significant intracranial injury. Vascular: No hyperdense vessel or unexpected calcification. Skull: No acute calvarial abnormality. Sinuses/Orbits: Visualized paranasal sinuses and mastoids clear. Orbital soft tissues unremarkable. Other: None IMPRESSION: Atrophy, chronic microvascular disease. No acute intracranial abnormality. Electronically Signed   By: Rolm Baptise M.D.   On: 10/19/2018 02:25   Mr Brain Wo Contrast  Result Date: 10/24/2018 CLINICAL DATA:  Encephalopathy. Altered level of consciousness. History of lung cancer. EXAM: MRI HEAD WITHOUT CONTRAST TECHNIQUE: Multiplanar, multiecho pulse sequences of the brain and surrounding structures were obtained without intravenous contrast. COMPARISON:  CT head 10/19/2018 FINDINGS: Brain: Mild atrophy. Negative for hydrocephalus. Negative for acute infarct. Several tiny white matter hyper intensities bilaterally. Negative for hemorrhage or mass. No evidence of metastatic disease on unenhanced images. Vascular: Normal arterial flow voids. Skull and upper cervical spine: Negative Sinuses/Orbits: Mild mucosal edema paranasal sinuses.  Normal orbit Other: None IMPRESSION: No acute abnormality.  No acute infarct or metastatic disease Atrophy and minimal chronic white matter change. Electronically Signed   By: Franchot Gallo M.D.   On: 10/24/2018 16:42   Dg Chest Port 1 View  Result Date: 10/22/2018 CLINICAL DATA:  Hypotension EXAM: PORTABLE CHEST 1 VIEW COMPARISON:  10/10/2016, 12/25/2017, PET-CT from 02/26/2018  FINDINGS: Cardiac shadow is enlarged. Persistent right upper lobe nodule is seen similar to that noted on prior PET-CT. This is consistent with the patient's given clinical history. Small right pleural effusion is noted. Mild central vascular congestion is noted likely related to the underlying renal failure. No bony abnormality is seen. IMPRESSION: Small right pleural effusion. Stable appearing right upper lobe mass. Mild vascular congestion. Electronically Signed   By: Inez Catalina M.D.   On: 10/22/2018 10:23       Subjective: No new complaints. Only one bM yesterday.  Looking forward to going home.   Discharge Exam: Vitals:   10/26/18 0439 10/26/18 0700  BP: 106/63 106/68  Pulse: 79 75  Resp: 16   Temp:  98.3 F (36.8 C)  SpO2:  100%   Vitals:   10/25/18 2020 10/26/18 0003 10/26/18 0439 10/26/18 0700  BP: 120/66 96/74 106/63 106/68  Pulse: 79 75 79 75  Resp: 19 16 16    Temp: 99.2 F (37.3 C) 98.3 F (36.8 C)  98.3 F (36.8 C)  TempSrc:  Oral  Axillary  SpO2: 95% 93%  100%  Weight:      Height:        General: Pt is alert, awake, not in acute distress Cardiovascular: RRR, S1/S2 +, no rubs, no gallops Respiratory: CTA bilaterally, no wheezing, no rhonchi Abdominal: Soft, NT, ND, bowel sounds + Extremities: no edema, no cyanosis  The results of significant diagnostics from this hospitalization (including imaging, microbiology, ancillary and laboratory) are listed below for reference.     Microbiology: Recent Results (from the past 240 hour(s))  SARS CORONAVIRUS 2 (TAT 6-24 HRS) Nasopharyngeal Nasopharyngeal Swab     Status: None   Collection Time: 10/18/18 12:05 AM   Specimen: Nasopharyngeal Swab  Result Value Ref Range Status   SARS Coronavirus 2 NEGATIVE NEGATIVE Final    Comment: (NOTE) SARS-CoV-2 target nucleic acids are NOT DETECTED. The SARS-CoV-2 RNA is generally detectable in upper and lower respiratory specimens during the acute phase of infection.  Negative results do not preclude SARS-CoV-2 infection, do not rule out co-infections with other pathogens, and should not be used as the sole basis for treatment or other patient management decisions. Negative results must be combined with clinical observations, patient history, and epidemiological information. The expected result is Negative. Fact Sheet for Patients: SugarRoll.be Fact Sheet for Healthcare Providers: https://www.woods-mathews.com/ This test is not yet approved or cleared by the [place] FDA and  has been authorized for detection and/or diagnosis of SARS-CoV-2 by FDA under an Emergency Use Authorization (EUA). This EUA will remain  in effect (meaning this test can be used) for the duration of the COVID-19 declaration under Section 56 4(b)(1) of the Act, 21 U.S.C. section 360bbb-3(b)(1), unless the authorization is terminated or revoked sooner. Performed at [facility], [place] [address] [place], [place] [address]   Culture, blood (routine x 2)     Status: None   Collection Time: 10/18/18 12:10 PM   Specimen: BLOOD RIGHT HAND  Result Value Ref Range Status   Specimen Description BLOOD RIGHT HAND  Final   Special Requests   Final    BOTTLES DRAWN AEROBIC ONLY Blood Culture adequate volume   Culture   Final    NO GROWTH 5 DAYS Performed at [facility], [place] [address] [place], [place] [address]    Report Status 10/23/2018 FINAL  Final  Gastrointestinal Panel by PCR , Stool     Status: Abnormal   Collection Time: 10/19/18 10:11 AM   Specimen: Stool  Result Value Ref Range Status   Campylobacter species NOT DETECTED NOT DETECTED Final   Plesimonas shigelloides NOT DETECTED NOT DETECTED Final   Salmonella species NOT DETECTED NOT DETECTED Final   Yersinia enterocolitica NOT DETECTED NOT DETECTED Final   Vibrio species NOT DETECTED NOT DETECTED Final   Vibrio cholerae NOT DETECTED NOT DETECTED Final    Enteroaggregative E coli (EAEC) NOT DETECTED NOT DETECTED Final   Enteropathogenic E coli (EPEC) DETECTED (A) NOT DETECTED Final    Comment: RESULT CALLED TO, READ BACK BY AND VERIFIED WITH: LAURYN BROWN AT 1330 10/20/2018.PMF    Enterotoxigenic E coli (ETEC) NOT DETECTED NOT DETECTED Final   Shiga like toxin producing E coli (STEC) NOT DETECTED NOT DETECTED Final   Shigella/Enteroinvasive E coli (EIEC) NOT DETECTED NOT DETECTED Final   Cryptosporidium NOT DETECTED NOT DETECTED Final   Cyclospora cayetanensis NOT DETECTED NOT DETECTED Final   Entamoeba histolytica NOT DETECTED NOT DETECTED Final   Giardia lamblia NOT DETECTED NOT DETECTED Final   Adenovirus F40/41 NOT DETECTED NOT DETECTED Final   Astrovirus NOT DETECTED NOT DETECTED Final   Norovirus GI/GII NOT DETECTED NOT DETECTED Final   Rotavirus A NOT DETECTED NOT DETECTED Final   Sapovirus (I, II, IV, and V) NOT DETECTED NOT DETECTED Final    Comment: Performed at [facility], [place],  [place], [place] [address]  C Difficile Quick Screen w PCR reflex     Status: Abnormal   Collection Time: 10/19/18  4:05 PM   Specimen: Stool  Result Value Ref Range Status   C Diff antigen POSITIVE (A) NEGATIVE Final   C Diff toxin NEGATIVE NEGATIVE Final   C Diff interpretation Results are indeterminate. See PCR results.  Final    Comment: Performed at [facility], [place] [address], [place], [place] [address]  C. Diff by PCR, Reflexed     Status: None   Collection Time: 10/19/18  4:05 PM  Result Value Ref Range Status   Toxigenic C. Difficile by PCR NEGATIVE NEGATIVE Final    Comment: Patient is colonized with non toxigenic C. difficile. May not need treatment unless significant symptoms are present. Performed at [facility], [place] [address], [place], [place] [address]   Culture, blood (routine x 2)     Status: None (Preliminary result)   Collection Time: 10/22/18 11:06 AM   Specimen: BLOOD RIGHT ARM  Result Value  Ref Range Status   Specimen Description BLOOD RIGHT ARM  Final   Special Requests   Final    BOTTLES DRAWN AEROBIC ONLY Blood Culture adequate volume   Culture   Final    NO GROWTH 4 DAYS Performed at [facility], [place] [address], [place], [place] [address]    Report Status PENDING  Incomplete  Culture, blood (routine x 2)     Status: None (Preliminary result)   Collection Time: 10/22/18 11:06 AM   Specimen: BLOOD RIGHT ARM  Result Value Ref Range Status   Specimen Description BLOOD RIGHT ARM  Final   Special Requests   Final    BOTTLES DRAWN AEROBIC ONLY Blood Culture results may not be optimal due to an inadequate volume of blood received in culture bottles   Culture   Final    NO GROWTH 4 DAYS Performed at [facility], [place] [address], [place],  [address]    Report Status PENDING  Incomplete     Labs: BNP (last 3 results) No results for input(s): BNP in the last 8760 hours. Basic Metabolic Panel: Recent Labs  Lab 10/21/18 0637 10/22/18 0330 10/23/18 0500 10/23/18 1816 10/25/18 0746  NA 132* 130* 126* 135 131*  K 3.6 3.6 3.8 3.8 4.4  CL 98 97* 94* 100 98  CO2 24 25 24 27 24   GLUCOSE 87 91 83 128* 76  BUN 22 15 29* 16 30*  CREATININE 5.20* 3.39* 4.49* 2.85* 4.59*  CALCIUM 7.6* 7.4* 7.5* 7.8* 7.8*  PHOS 2.6  --  2.5  --  2.2*   Liver Function Tests: Recent Labs  Lab 10/20/18 0536 10/21/18 0637 10/22/18 0330 10/23/18 0500 10/25/18 0746  AST QUANTITY NOT SUFFICIENT, UNABLE TO PERFORM TEST 18 19  --   --   ALT QUANTITY NOT SUFFICIENT, UNABLE TO PERFORM TEST 12 10  --   --   ALKPHOS 132* 115 117  --   --   BILITOT QUANTITY NOT SUFFICIENT, UNABLE TO PERFORM TEST 1.3* 1.1  --   --   PROT 6.2* 6.1* 5.9*  --   --   ALBUMIN 2.1* 1.9* 1.9* 1.9* 1.9*   No results for input(s): LIPASE, AMYLASE in the last 168 hours. No results for input(s): AMMONIA in the last 168 hours. CBC: Recent Labs  Lab 10/20/18 0536 10/21/18 0637 10/22/18 0330  10/23/18 0552 10/25/18 0746  WBC 7.1  6.6 6.0 5.7 5.4  NEUTROABS 4.7 4.9 4.3 3.9  --   HGB 11.0* 10.1* 9.6* 9.5* 8.5*  HCT 32.9* 31.0* 29.6* 29.1* 26.3*  MCV 96.2 97.8 98.3 98.3 99.2  PLT 98* 100* 61* 76* 95*   Cardiac Enzymes: No results for input(s): CKTOTAL, CKMB, CKMBINDEX, TROPONINI in the last 168 hours. BNP: Invalid input(s): POCBNP CBG: Recent Labs  Lab 10/26/18 0007 10/26/18 0241 10/26/18 0437 10/26/18 0611 10/26/18 0815  GLUCAP 86 87 77 70 78   D-Dimer No results for input(s): DDIMER in the last 72 hours. Hgb A1c No results for input(s): HGBA1C in the last 72 hours. Lipid Profile No results for input(s): CHOL, HDL, LDLCALC, TRIG, CHOLHDL, LDLDIRECT in the last 72 hours. Thyroid function studies No results for input(s): TSH, T4TOTAL, T3FREE, THYROIDAB in the last 72 hours.  Invalid input(s): FREET3 Anemia work up No results for input(s): VITAMINB12, FOLATE, FERRITIN, TIBC, IRON, RETICCTPCT in the last 72 hours. Urinalysis    Component Value Date/Time   COLORURINE YELLOW 08/27/2013 0546   APPEARANCEUR CLOUDY (A) 08/27/2013 0546   LABSPEC 1.026 08/27/2013 0546   PHURINE 7.5 08/27/2013 0546   GLUCOSEU NEGATIVE 08/27/2013 0546   HGBUR TRACE (A) 08/27/2013 0546   BILIRUBINUR SMALL (A) 08/27/2013 0546   KETONESUR NEGATIVE 08/27/2013 0546   PROTEINUR >300 (A) 08/27/2013 0546   UROBILINOGEN 0.2 08/27/2013 0546   NITRITE NEGATIVE 08/27/2013 0546   LEUKOCYTESUR NEGATIVE 08/27/2013 0546   Sepsis Labs Invalid input(s): PROCALCITONIN,  WBC,  LACTICIDVEN Microbiology Recent Results (from the past 240 hour(s))  SARS CORONAVIRUS 2 (TAT 6-24 HRS) Nasopharyngeal Nasopharyngeal Swab     Status: None   Collection Time: 10/18/18 12:05 AM   Specimen: Nasopharyngeal Swab  Result Value Ref Range Status   SARS Coronavirus 2 NEGATIVE NEGATIVE Final    Comment: (NOTE) SARS-CoV-2 target nucleic acids are NOT DETECTED. The SARS-CoV-2 RNA is generally detectable in upper and  lower respiratory specimens during the acute phase of infection. Negative results do not preclude SARS-CoV-2 infection, do not rule out co-infections with other pathogens, and should not be used as the sole basis for treatment or other patient management decisions. Negative results must be combined with clinical observations, patient history, and epidemiological information. The expected result is Negative. Fact Sheet for Patients: SugarRoll.be Fact Sheet for Healthcare Providers: https://www.woods-mathews.com/ This test is not yet approved or cleared by the [place] FDA and  has been authorized for detection and/or diagnosis of SARS-CoV-2 by FDA under an Emergency Use Authorization (EUA). This EUA will remain  in effect (meaning this test can be used) for the duration of the COVID-19 declaration under Section 56 4(b)(1) of the Act, 21 U.S.C. section 360bbb-3(b)(1), unless the authorization is terminated or revoked sooner. Performed at [facility], [place] [address] [place], [place] [address]   Culture, blood (routine x 2)     Status: None   Collection Time: 10/18/18 12:10 PM   Specimen: BLOOD RIGHT HAND  Result Value Ref Range Status   Specimen Description BLOOD RIGHT HAND  Final   Special Requests   Final    BOTTLES DRAWN AEROBIC ONLY Blood Culture adequate volume   Culture   Final    NO GROWTH 5 DAYS Performed at [facility], [place] [address], [place], [place] [address]    Report Status 10/23/2018 FINAL  Final  Gastrointestinal Panel by PCR , Stool     Status: Abnormal   Collection Time: 10/19/18 10:11 AM   Specimen:  Stool  Result Value Ref Range Status   Campylobacter species NOT DETECTED NOT DETECTED Final   Plesimonas shigelloides NOT DETECTED NOT DETECTED Final   Salmonella species NOT DETECTED NOT DETECTED Final   Yersinia enterocolitica NOT DETECTED NOT DETECTED Final   Vibrio species NOT DETECTED NOT DETECTED  Final   Vibrio cholerae NOT DETECTED NOT DETECTED Final   Enteroaggregative E coli (EAEC) NOT DETECTED NOT DETECTED Final   Enteropathogenic E coli (EPEC) DETECTED (A) NOT DETECTED Final    Comment: RESULT CALLED TO, READ BACK BY AND VERIFIED WITH: LAURYN BROWN AT 1330 10/20/2018.PMF    Enterotoxigenic E coli (ETEC) NOT DETECTED NOT DETECTED Final   Shiga like toxin producing E coli (STEC) NOT DETECTED NOT DETECTED Final   Shigella/Enteroinvasive E coli (EIEC) NOT DETECTED NOT DETECTED Final   Cryptosporidium NOT DETECTED NOT DETECTED Final   Cyclospora cayetanensis NOT DETECTED NOT DETECTED Final   Entamoeba histolytica NOT DETECTED NOT DETECTED Final   Giardia lamblia NOT DETECTED NOT DETECTED Final   Adenovirus F40/41 NOT DETECTED NOT DETECTED Final   Astrovirus NOT DETECTED NOT DETECTED Final   Norovirus GI/GII NOT DETECTED NOT DETECTED Final   Rotavirus A NOT DETECTED NOT DETECTED Final   Sapovirus (I, II, IV, and V) NOT DETECTED NOT DETECTED Final    Comment: Performed at [facility], [address] [place], [place] [address]  C Difficile Quick Screen w PCR reflex     Status: Abnormal   Collection Time: 10/19/18  4:05 PM   Specimen: Stool  Result Value Ref Range Status   C Diff antigen POSITIVE (A) NEGATIVE Final   C Diff toxin NEGATIVE NEGATIVE Final   C Diff interpretation Results are indeterminate. See PCR results.  Final    Comment: Performed at [facility], [place] [address] [place], [place] [address]  C. Diff by PCR, Reflexed     Status: None   Collection Time: 10/19/18  4:05 PM  Result Value Ref Range Status   Toxigenic C. Difficile by PCR NEGATIVE NEGATIVE Final    Comment: Patient is colonized with non toxigenic C. difficile. May not need treatment unless significant symptoms are present. Performed at [facility], [place] [address] [place], [place] [address]   Culture, blood (routine x 2)     Status: None (Preliminary result)   Collection Time:  10/22/18 11:06 AM   Specimen: BLOOD RIGHT ARM  Result Value Ref Range Status   Specimen Description BLOOD RIGHT ARM  Final   Special Requests   Final    BOTTLES DRAWN AEROBIC ONLY Blood Culture adequate volume   Culture   Final    NO GROWTH 4 DAYS Performed at [facility], [place] [address], [place], [place] [address]    Report Status PENDING  Incomplete  Culture, blood (routine x 2)     Status: None (Preliminary result)   Collection Time: 10/22/18 11:06 AM   Specimen: BLOOD RIGHT ARM  Result Value Ref Range Status   Specimen Description BLOOD RIGHT ARM  Final   Special Requests   Final    BOTTLES DRAWN AEROBIC ONLY Blood Culture results may not be optimal due to an inadequate volume of blood received in culture bottles   Culture   Final    NO GROWTH 4 DAYS Performed at [facility], [place] [address], [place], [place] [address]    Report Status PENDING  Incomplete     Time coordinating discharge: 32 minutes  SIGNED:   Hosie Poisson, MD  [facility] 10/26/2018, 10:18 AM Pager   If 7PM-7AM, please contact night-coverage [email or web address] Password [email or web address]

## 2018-10-26 NOTE — Progress Notes (Signed)
Manufacturing engineer [facility]) Community Based Palliative Care  Referral received for palliative care resources once discharged home later today.    ACC will reach out to Ms. Sauseda and her family to schedule an appointment for palliative services.  Thank you, Venia Carbon RN, BSN, [facility] Liaison (in [place]) [phone]

## 2018-10-26 NOTE — TOC Transition Note (Signed)
Transition of Care [facility]) - CM/SW Discharge Note   Patient Details  Name: Jessica Clarke MRN: [id number] Date of Birth: May 14, 1950  Transition of Care [facility]) CM/SW Contact:  [name], RN Phone Number: 10/26/2018, 11:41 AM   Clinical Narrative:    CM spoke to daughter and DME has been delivered to the home. Daughter to provide transport home.  CM updated [name] with [facility] of d/c home today. Bedside RN updated.    Final next level of care: Home w Home Health Services Barriers to Discharge: No Barriers Identified   Patient Goals and CMS Choice   CMS Medicare.gov Compare Post Acute Care list provided to:: Patient Represenative (must comment) Choice offered to / list presented to : Adult Children  Discharge Placement                       Discharge Plan and Services   Discharge Planning Services: CM Consult Post Acute Care Choice: Home Health, Durable Medical Equipment                    HH Arranged: RN, PT, OT, Nurse's Aide, Social Work [employer] Agency: [employer] [facility] [employer]     Wellsite geologist spoke with at [place]: [place] (SDOH) Interventions     Readmission Risk Interventions No flowsheet data found.

## 2018-10-26 NOTE — Plan of Care (Signed)
Progressing towards goals

## 2018-10-27 DIAGNOSIS — N186 End stage renal disease: Secondary | ICD-10-CM | POA: Diagnosis not present

## 2018-10-27 DIAGNOSIS — Z992 Dependence on renal dialysis: Secondary | ICD-10-CM | POA: Diagnosis not present

## 2018-10-27 DIAGNOSIS — N2889 Other specified disorders of kidney and ureter: Secondary | ICD-10-CM | POA: Diagnosis not present

## 2018-10-27 LAB — CULTURE, BLOOD (ROUTINE X 2)
Culture: NO GROWTH
Culture: NO GROWTH
Special Requests: ADEQUATE

## 2018-10-28 ENCOUNTER — Telehealth: Payer: Self-pay | Admitting: Medical Oncology

## 2018-10-28 ENCOUNTER — Other Ambulatory Visit: Payer: Self-pay | Admitting: Emergency Medicine

## 2018-10-28 ENCOUNTER — Inpatient Hospital Stay: Payer: Medicare Other | Attending: Internal Medicine | Admitting: Internal Medicine

## 2018-10-28 ENCOUNTER — Inpatient Hospital Stay: Payer: Medicare Other

## 2018-10-28 ENCOUNTER — Encounter: Payer: Self-pay | Admitting: Internal Medicine

## 2018-10-28 ENCOUNTER — Telehealth: Payer: Self-pay | Admitting: Internal Medicine

## 2018-10-28 ENCOUNTER — Other Ambulatory Visit: Payer: Self-pay

## 2018-10-28 ENCOUNTER — Telehealth: Payer: Self-pay | Admitting: *Deleted

## 2018-10-28 ENCOUNTER — Telehealth: Payer: Self-pay | Admitting: Licensed Clinical Social Worker

## 2018-10-28 ENCOUNTER — Other Ambulatory Visit: Payer: Self-pay | Admitting: Medical Oncology

## 2018-10-28 VITALS — BP 105/57 | HR 73 | Temp 97.9°F | Resp 18 | Ht 71.0 in | Wt 172.1 lb

## 2018-10-28 DIAGNOSIS — IMO0001 Reserved for inherently not codable concepts without codable children: Secondary | ICD-10-CM

## 2018-10-28 DIAGNOSIS — R911 Solitary pulmonary nodule: Secondary | ICD-10-CM | POA: Diagnosis not present

## 2018-10-28 DIAGNOSIS — Z7982 Long term (current) use of aspirin: Secondary | ICD-10-CM | POA: Diagnosis not present

## 2018-10-28 DIAGNOSIS — I10 Essential (primary) hypertension: Secondary | ICD-10-CM | POA: Diagnosis not present

## 2018-10-28 DIAGNOSIS — Z79899 Other long term (current) drug therapy: Secondary | ICD-10-CM | POA: Diagnosis not present

## 2018-10-28 DIAGNOSIS — D631 Anemia in chronic kidney disease: Secondary | ICD-10-CM | POA: Insufficient documentation

## 2018-10-28 DIAGNOSIS — Z992 Dependence on renal dialysis: Secondary | ICD-10-CM | POA: Diagnosis not present

## 2018-10-28 DIAGNOSIS — R918 Other nonspecific abnormal finding of lung field: Secondary | ICD-10-CM | POA: Insufficient documentation

## 2018-10-28 DIAGNOSIS — D649 Anemia, unspecified: Secondary | ICD-10-CM

## 2018-10-28 DIAGNOSIS — N186 End stage renal disease: Secondary | ICD-10-CM | POA: Insufficient documentation

## 2018-10-28 LAB — CBC WITH DIFFERENTIAL (CANCER CENTER ONLY)
Abs Immature Granulocytes: 0.01 10*3/uL (ref 0.00–0.07)
Basophils Absolute: 0 10*3/uL (ref 0.0–0.1)
Basophils Relative: 1 %
Eosinophils Absolute: 0.1 10*3/uL (ref 0.0–0.5)
Eosinophils Relative: 2 %
HCT: 18.7 % — ABNORMAL LOW (ref 36.0–46.0)
Hemoglobin: 6 g/dL — CL (ref 12.0–15.0)
Immature Granulocytes: 0 %
Lymphocytes Relative: 21 %
Lymphs Abs: 1.2 10*3/uL (ref 0.7–4.0)
MCH: 31.9 pg (ref 26.0–34.0)
MCHC: 32.1 g/dL (ref 30.0–36.0)
MCV: 99.5 fL (ref 80.0–100.0)
Monocytes Absolute: 0.5 10*3/uL (ref 0.1–1.0)
Monocytes Relative: 8 %
Neutro Abs: 4.1 10*3/uL (ref 1.7–7.7)
Neutrophils Relative %: 68 %
Platelet Count: 146 10*3/uL — ABNORMAL LOW (ref 150–400)
RBC: 1.88 MIL/uL — ABNORMAL LOW (ref 3.87–5.11)
RDW: 16.5 % — ABNORMAL HIGH (ref 11.5–15.5)
WBC Count: 5.9 10*3/uL (ref 4.0–10.5)
nRBC: 0 % (ref 0.0–0.2)

## 2018-10-28 LAB — CMP (CANCER CENTER ONLY)
ALT: 9 U/L (ref 0–44)
AST: 22 U/L (ref 15–41)
Albumin: 2 g/dL — ABNORMAL LOW (ref 3.5–5.0)
Alkaline Phosphatase: 205 U/L — ABNORMAL HIGH (ref 38–126)
Anion gap: 7 (ref 5–15)
BUN: 74 mg/dL — ABNORMAL HIGH (ref 8–23)
CO2: 27 mmol/L (ref 22–32)
Calcium: 8 mg/dL — ABNORMAL LOW (ref 8.9–10.3)
Chloride: 95 mmol/L — ABNORMAL LOW (ref 98–111)
Creatinine: 4.5 mg/dL (ref 0.44–1.00)
GFR, Est AFR Am: 11 mL/min — ABNORMAL LOW (ref >60)
GFR, Estimated: 9 mL/min — ABNORMAL LOW (ref >60)
Glucose, Bld: 90 mg/dL (ref 70–99)
Potassium: 4.7 mmol/L (ref 3.5–5.1)
Sodium: 129 mmol/L — ABNORMAL LOW (ref 135–145)
Total Bilirubin: 0.5 mg/dL (ref 0.3–1.2)
Total Protein: 5.9 g/dL — ABNORMAL LOW (ref 6.5–8.1)

## 2018-10-28 LAB — PREPARE RBC (CROSSMATCH)

## 2018-10-28 LAB — ABO/RH: ABO/RH(D): O POS

## 2018-10-28 NOTE — Telephone Encounter (Signed)
CRITICAL VALUE STICKER  CRITICAL VALUE: Creat = 4.50  RECEIVER (on-site recipient of call): Cherylynn Ridges RN, Triage [place] NOTIFIED: 10/28/2018 at 1448.   MESSENGER (representative from lab): Kushani MT Myles Gip  MD NOTIFIED: Dr. Julien Nordmann  TIME OF NOTIFICATION: 10/28/2018 at 1459.  RESPONSE: None Jessica Clarke currently here for new patient visit.

## 2018-10-28 NOTE — Telephone Encounter (Addendum)
Pt and dtr notified of blood transfusion tomorrow at 9 am for two units. Both notified to keep blue arm band on pt .  Dr Julien Nordmann would like for Dr Justin Mend to manage anemia/blood transfusion  related to kidney disease.   [facility] charge nurse notified and message sent to Dr Justin Mend and Dr Baird Cancer.

## 2018-10-28 NOTE — Telephone Encounter (Signed)
Gave avs and calendar ° °

## 2018-10-28 NOTE — Progress Notes (Signed)
[place] Telephone:([phone]   [phone]  OFFICE PROGRESS NOTE  [place], [place] [address] [place] [address]  DIAGNOSIS: Hypermetabolic right upper lobe pulmonary nodule suspicious for lung cancer.  PRIOR THERAPY: None  CURRENT THERAPY: None  INTERVAL HISTORY: Jessica Clarke 68 y.o. female returns to the clinic today for follow-up visit.  The patient was lost to follow-up since January 2020.  She had a PET scan that was suspicious for right upper lobe primary lung malignancy.  The patient was supposed to have pulmonary function test and follow-up after her last visit in January but she was lost to follow-up since that time.  She was recently admitted to the hospital with rectal bleeding and severe anemia.  She was treated for diverticulitis.  She received 3 units of PRBCs transfusion during her hospitalization.  She is currently on hemodialysis Monday, Wednesday and Friday followed by Dr. Justin Mend.  I am not sure if she is currently undergoing any iron infusion as well as growth factor.  She denied having any current chest pain but has shortness of breath with exertion with no cough or hemoptysis.  She denied having any fever or chills.  She has no nausea, vomiting, diarrhea or constipation.  She presented today for reevaluation and recommendation regarding her condition.  MEDICAL HISTORY: Past Medical History:  Diagnosis Date   Abnormal Pap smear    ASCUS ?HGSIL   Anemia    Cervical dysplasia    CHF (congestive heart failure) (HCC)    Chronic kidney disease    Constipation    Constipation    GERD (gastroesophageal reflux disease)    Gout    Hyperlipidemia    Hypertension    Myocardial infarction ([place]) 2015   Obesity    Pneumonia    Uterine polyp     ALLERGIES:  has No Known Allergies.  MEDICATIONS:  Current Outpatient Medications  Medication Sig Dispense Refill   acetaminophen (TYLENOL) 325 MG tablet  Take 325-650 mg by mouth every 8 (eight) hours as needed for mild pain.      allopurinol (ZYLOPRIM) 100 MG tablet Take 100 mg by mouth daily.       Amino Acids-Protein Hydrolys (FEEDING SUPPLEMENT, PRO-STAT SUGAR FREE 64,) LIQD Take 30 mLs by mouth 2 (two) times daily. 887 mL 0   aspirin EC 325 MG tablet Take 1 tablet (325 mg total) by mouth daily. 30 tablet 0   atorvastatin (LIPITOR) 10 MG tablet Take 10 mg by mouth at bedtime.   0   calcitRIOL (ROCALTROL) 0.5 MCG capsule Take 2 capsules (1 mcg total) by mouth every Monday, Wednesday, and Friday with hemodialysis. 30 capsule 0   cinacalcet (SENSIPAR) 30 MG tablet Take 1 tablet (30 mg total) by mouth daily with supper. 60 tablet 0   colchicine 0.6 MG tablet Take 0.6 mg by mouth daily as needed (as directed for gout flares or pain).      [START ON 11/01/2018] Darbepoetin Alfa (ARANESP) 60 MCG/0.3ML SOSY injection Inject 0.3 mLs (60 mcg total) into the vein every Friday with hemodialysis. 4.2 mL    hydrocerin (EUCERIN) CREA Apply 1 application topically 2 (two) times daily. 228 g 0   midodrine (PROAMATINE) 10 MG tablet Take 1 tablet (10 mg total) by mouth 2 (two) times daily with a meal. 60 tablet 0   multivitamin (RENA-VIT) TABS tablet Take 1 tablet by mouth at bedtime. 30 tablet 0   nicotine (NICODERM CQ -  DOSED IN MG/24 HOURS) 14 mg/24hr patch Place 1 patch (14 mg total) onto the skin daily as needed (smoking craving). 28 patch 0   thiamine (VITAMIN B-1) 100 MG tablet Take 100 mg by mouth daily.     VELPHORO 500 MG chewable tablet Chew 2 tablets by mouth 3 (three) times daily with meals.      No current facility-administered medications for this visit.     SURGICAL HISTORY:  Past Surgical History:  Procedure Laterality Date   AV FISTULA PLACEMENT  01/18/2012   Procedure: ARTERIOVENOUS (AV) FISTULA CREATION;  Surgeon: Angelia Mould, MD;  Location: [facility];  Service: Vascular;  Laterality: Left;   BIOPSY  10/20/2018    Procedure: BIOPSY;  Surgeon: Thornton Park, MD;  Location: [facility];  Service: Gastroenterology;;   COLONOSCOPY WITH PROPOFOL N/A 10/20/2018   Procedure: COLONOSCOPY WITH PROPOFOL;  Surgeon: Thornton Park, MD;  Location: [facility];  Service: Gastroenterology;  Laterality: N/A;   DILATION AND CURETTAGE OF UTERUS     HYSTEROSCOPY     LEEP     LEFT HEART CATHETERIZATION WITH CORONARY ANGIOGRAM N/A 08/26/2013   Procedure: LEFT HEART CATHETERIZATION WITH CORONARY ANGIOGRAM;  Surgeon: Leonie Man, MD;  Location: [facility] CATH LAB;  Service: Cardiovascular;  Laterality: N/A;   POLYPECTOMY  10/20/2018   Procedure: POLYPECTOMY;  Surgeon: Thornton Park, MD;  Location: [facility] ENDOSCOPY;  Service: Gastroenterology;;   REVISON OF ARTERIOVENOUS FISTULA Left 06/01/3788   Procedure: PLICATION OF ARTERIOVENOUS FISTULA;  Surgeon: Conrad Meyers Lake, MD;  Location: [place];  Service: Vascular;  Laterality: Left;    REVIEW OF SYSTEMS:  Constitutional: positive for fatigue Eyes: negative Ears, nose, mouth, throat, and face: negative Respiratory: positive for dyspnea on exertion Cardiovascular: negative Gastrointestinal: negative Genitourinary:negative Integument/breast: negative Hematologic/lymphatic: negative Musculoskeletal:positive for muscle weakness Neurological: negative Behavioral/Psych: negative Endocrine: negative Allergic/Immunologic: negative   PHYSICAL EXAMINATION: General appearance: alert, cooperative, fatigued and no distress Head: Normocephalic, without obvious abnormality, atraumatic Neck: no adenopathy, no JVD, supple, symmetrical, trachea midline and thyroid not enlarged, symmetric, no tenderness/mass/nodules Lymph nodes: Cervical, supraclavicular, and axillary nodes normal. Resp: clear to auscultation bilaterally Back: symmetric, no curvature. ROM normal. No CVA tenderness. Cardio: regular rate and rhythm, S1, S2 normal, no murmur, click, rub or gallop GI: soft, non-tender;  bowel sounds normal; no masses,  no organomegaly Extremities: extremities normal, atraumatic, no cyanosis or edema Neurologic: Alert and oriented X 3, normal strength and tone. Normal symmetric reflexes. Normal coordination and gait  ECOG PERFORMANCE STATUS: 1 - Symptomatic but completely ambulatory  Blood pressure (!) 105/57, pulse 73, temperature 97.9 F (36.6 C), temperature source Oral, resp. rate 18, height 5\' 11"  (1.803 m), weight 172 lb 1.6 oz (78.1 kg).  LABORATORY DATA: Lab Results  Component Value Date   WBC 5.4 10/25/2018   HGB 8.5 (L) 10/25/2018   HCT 26.3 (L) 10/25/2018   MCV 99.2 10/25/2018   PLT 95 (L) 10/25/2018      Chemistry      Component Value Date/Time   NA 131 (L) 10/25/2018 0746   K 4.4 10/25/2018 0746   CL 98 10/25/2018 0746   CO2 24 10/25/2018 0746   BUN 30 (H) 10/25/2018 0746   CREATININE 4.59 (H) 10/25/2018 0746   CREATININE 4.40 (HH) 02/04/2018 1507      Component Value Date/Time   CALCIUM 7.8 (L) 10/25/2018 0746   CALCIUM 9.0 10/22/2009 2253   ALKPHOS 117 10/22/2018 0330   AST 19 10/22/2018 0330   AST 23 02/04/2018  1507   ALT 10 10/22/2018 0330   ALT 14 02/04/2018 1507   BILITOT 1.1 10/22/2018 0330   BILITOT 1.0 02/04/2018 1507       RADIOGRAPHIC STUDIES: Ct Abdomen Pelvis Wo Contrast  Result Date: 10/18/2018 CLINICAL DATA:  Follow-up colitis EXAM: CT ABDOMEN AND PELVIS WITHOUT CONTRAST TECHNIQUE: Multidetector CT imaging of the abdomen and pelvis was performed following the standard protocol without IV contrast. COMPARISON:  09/30/2018 FINDINGS: Lower chest: Small right-sided pleural effusion is noted which has increased slightly in the interval from the prior exam. No focal confluent infiltrate is seen. Cardiomegaly is noted. Hepatobiliary: No focal liver abnormality is seen. No gallstones, gallbladder wall thickening, or biliary dilatation. Pancreas: Unremarkable. No pancreatic ductal dilatation or surrounding inflammatory changes. Spleen:  Normal in size without focal abnormality. Adrenals/Urinary Tract: Adrenal glands are stable in appearance. The native kidneys are atrophic. The bladder is decompressed. No obstructive changes are seen. Stomach/Bowel: Diverticulosis is noted without evidence of definitive diverticulitis. No obstructive changes are seen. Persistent wall thickening in the ascending colon is noted again suspicious for colitis. The appendix is within normal limits. No small bowel abnormality is seen. The stomach is unremarkable. Vascular/Lymphatic: Aortic atherosclerosis. No enlarged abdominal or pelvic lymph nodes. Reproductive: Uterus and bilateral adnexa are unremarkable. Other: Mild ascites is noted as well as changes of anasarca these changes are similar to that seen on the prior exam and may contribute somewhat to the colonic edema. Musculoskeletal: Degenerative changes of the lumbar spine are noted. IMPRESSION: Stable edematous changes in the ascending colon consistent with colitis although the mild ascites and third spacing of fluid may contribute to this abnormality. Changes of anasarca and mild ascites. Slight increase in right-sided pleural effusion when compare with the prior exam. Electronically Signed   By: Inez Catalina M.D.   On: 10/18/2018 06:33   Ct Abdomen Pelvis Wo Contrast  Result Date: 09/30/2018 CLINICAL DATA:  GI bleed. Dialysis patient. EXAM: CT ABDOMEN AND PELVIS WITHOUT CONTRAST TECHNIQUE: Multidetector CT imaging of the abdomen and pelvis was performed following the standard protocol without IV contrast. COMPARISON:  PET CT 02/26/2018 FINDINGS: Lower chest: Cardiomegaly with trace pericardial effusion. Coronary artery calcifications. Small right pleural effusion. Interstitial thickening at the bases, chronic. Hepatobiliary: No evidence of focal hepatic lesion on noncontrast exam. There is periportal edema. Gallbladder is decompressed. No calcified gallstone. Common bile duct not well-defined. Pancreas: No  ductal dilatation. Generalized edema throughout the abdomen and pelvis without increase around the pancreas to suggest pancreatitis. Spleen: Normal in size without focal abnormality. Adrenals/Urinary Tract: Left adrenal thickening, stable. Normal right adrenal gland. Atrophic kidneys with small bilateral cysts. Urinary bladder is minimally distended but thick walled. Stomach/Bowel: Mild colonic wall thickening involving the cecum in ascending colon. Colonic diverticulosis most prominent distally. No diverticulitis. No obstruction, administered enteric contrast reaches the colon. No small bowel wall thickening or inflammatory change. Stomach is unremarkable. Vascular/Lymphatic: Advanced aortic atherosclerosis. No aneurysm. Limited assessment for adenopathy given lack of IV contrast, ascites, and generalized edema. No bulky abdominopelvic adenopathy. Reproductive: Uterine calcifications, otherwise unremarkable. No obvious adnexal mass. Other: Small volume abdominopelvic ascites. Generalized soft tissue edema, involving both mesentery and subcutaneous tissues. Asymmetric edema in the right greater than left lower abdominal wall. No free intra-abdominal air. Musculoskeletal: Findings of renal osteodystrophy with ground-glass increased bone density. Multiple Schmorl's nodes in the lumbar spine. Degenerative disc disease at L5-S1. IMPRESSION: 1. Findings suggestive of ascending colitis, may be infectious or inflammatory. 2. Colonic diverticulosis without focal diverticulitis. 3.  Third-spacing/volume overload with generalized soft tissue edema throughout the abdomen and pelvis with small volume abdominopelvic ascites. Small right pleural effusion has slightly increased compared to to prior PET. 4. Advanced aortic atherosclerosis.  Coronary artery calcifications. Aortic Atherosclerosis (ICD10-I70.0). Electronically Signed   By: Keith Rake M.D.   On: 09/30/2018 03:49   Ct Head Wo Contrast  Result Date:  10/19/2018 CLINICAL DATA:  Fall, hit head EXAM: CT HEAD WITHOUT CONTRAST TECHNIQUE: Contiguous axial images were obtained from the base of the skull through the vertex without intravenous contrast. COMPARISON:  None. FINDINGS: Brain: There is atrophy and chronic small vessel disease changes. No acute intracranial abnormality. Specifically, no hemorrhage, hydrocephalus, mass lesion, acute infarction, or significant intracranial injury. Vascular: No hyperdense vessel or unexpected calcification. Skull: No acute calvarial abnormality. Sinuses/Orbits: Visualized paranasal sinuses and mastoids clear. Orbital soft tissues unremarkable. Other: None IMPRESSION: Atrophy, chronic microvascular disease. No acute intracranial abnormality. Electronically Signed   By: Rolm Baptise M.D.   On: 10/19/2018 02:25   Mr Brain Wo Contrast  Result Date: 10/24/2018 CLINICAL DATA:  Encephalopathy. Altered level of consciousness. History of lung cancer. EXAM: MRI HEAD WITHOUT CONTRAST TECHNIQUE: Multiplanar, multiecho pulse sequences of the brain and surrounding structures were obtained without intravenous contrast. COMPARISON:  CT head 10/19/2018 FINDINGS: Brain: Mild atrophy. Negative for hydrocephalus. Negative for acute infarct. Several tiny white matter hyper intensities bilaterally. Negative for hemorrhage or mass. No evidence of metastatic disease on unenhanced images. Vascular: Normal arterial flow voids. Skull and upper cervical spine: Negative Sinuses/Orbits: Mild mucosal edema paranasal sinuses.  Normal orbit Other: None IMPRESSION: No acute abnormality.  No acute infarct or metastatic disease Atrophy and minimal chronic white matter change. Electronically Signed   By: Franchot Gallo M.D.   On: 10/24/2018 16:42   Dg Chest Port 1 View  Result Date: 10/22/2018 CLINICAL DATA:  Hypotension EXAM: PORTABLE CHEST 1 VIEW COMPARISON:  10/10/2016, 12/25/2017, PET-CT from 02/26/2018 FINDINGS: Cardiac shadow is enlarged. Persistent  right upper lobe nodule is seen similar to that noted on prior PET-CT. This is consistent with the patient's given clinical history. Small right pleural effusion is noted. Mild central vascular congestion is noted likely related to the underlying renal failure. No bony abnormality is seen. IMPRESSION: Small right pleural effusion. Stable appearing right upper lobe mass. Mild vascular congestion. Electronically Signed   By: Inez Catalina M.D.   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   I personally and independently reviewed the PET scan images and discussed the results with the patient today. I had a lengthy discussion with the patient today about her current condition and treatment options.  The PET scan showed hypermetabolic right upper lobe pulmonary nodule highly suspicious for bronchogenic carcinoma.   The patient was referred for surgical evaluation in January 2020 but she was lost to follow-up and was not seen by surgery or myself for over 9 months. I recommended for the patient to have repeat CT scan of the chest without contrast for reevaluation of the right upper lobe pulmonary nodule and to rule out any other metastatic lesion that could have developed during this.. For the anemia of chronic disease, I will arrange for the patient to receive 2 units of PRBCs transfusion this week.  We will contact her primary nephrologist Dr. Justin Mend for consideration of iron infusion as  well as Procrit or Aranesp for management of her anemia of chronic disease.   For the end-stage renal disease, she is currently on hemodialysis Monday, Wednesday and Friday by Dr. Justin Mend. We will arrange for the patient to come back for follow-up visit in 1 week for more detailed discussion of her condition and further recommendation regarding her suspicious  lung cancer. The patient was advised to call immediately if she has any concerning symptoms in the interval. The patient voices understanding of current disease status and treatment options and is in agreement with the current care plan.  All questions were answered. The patient knows to call the clinic with any problems, questions or concerns. We can certainly see the patient much sooner if necessary.  I spent 15 minutes counseling the patient face to face. The total time spent in the appointment was 25 minutes.  Disclaimer: This note was dictated with voice recognition software. Similar sounding words can inadvertently be transcribed and may not be corrected upon review.

## 2018-10-28 NOTE — Telephone Encounter (Signed)
Palliative Care SW spoke with patient's daughter, [name], regarding scheduling a visit.  She stated patient has a MD appointment tomorrow and goes to dialysis MWF.  A visit is scheduled for 1pm on Thursday, 9/17.

## 2018-10-29 ENCOUNTER — Other Ambulatory Visit: Payer: Self-pay

## 2018-10-29 ENCOUNTER — Telehealth: Payer: Self-pay

## 2018-10-29 ENCOUNTER — Inpatient Hospital Stay: Payer: Medicare Other

## 2018-10-29 DIAGNOSIS — Z9181 History of falling: Secondary | ICD-10-CM | POA: Diagnosis not present

## 2018-10-29 DIAGNOSIS — N186 End stage renal disease: Secondary | ICD-10-CM | POA: Diagnosis not present

## 2018-10-29 DIAGNOSIS — I959 Hypotension, unspecified: Secondary | ICD-10-CM | POA: Diagnosis not present

## 2018-10-29 DIAGNOSIS — Z79899 Other long term (current) drug therapy: Secondary | ICD-10-CM | POA: Diagnosis not present

## 2018-10-29 DIAGNOSIS — E669 Obesity, unspecified: Secondary | ICD-10-CM | POA: Diagnosis not present

## 2018-10-29 DIAGNOSIS — F17210 Nicotine dependence, cigarettes, uncomplicated: Secondary | ICD-10-CM | POA: Diagnosis not present

## 2018-10-29 DIAGNOSIS — I2510 Atherosclerotic heart disease of native coronary artery without angina pectoris: Secondary | ICD-10-CM | POA: Diagnosis not present

## 2018-10-29 DIAGNOSIS — R918 Other nonspecific abnormal finding of lung field: Secondary | ICD-10-CM | POA: Diagnosis not present

## 2018-10-29 DIAGNOSIS — Z85118 Personal history of other malignant neoplasm of bronchus and lung: Secondary | ICD-10-CM | POA: Diagnosis not present

## 2018-10-29 DIAGNOSIS — Z86010 Personal history of colonic polyps: Secondary | ICD-10-CM | POA: Diagnosis not present

## 2018-10-29 DIAGNOSIS — I132 Hypertensive heart and chronic kidney disease with heart failure and with stage 5 chronic kidney disease, or end stage renal disease: Secondary | ICD-10-CM | POA: Diagnosis not present

## 2018-10-29 DIAGNOSIS — K5790 Diverticulosis of intestine, part unspecified, without perforation or abscess without bleeding: Secondary | ICD-10-CM | POA: Diagnosis not present

## 2018-10-29 DIAGNOSIS — I252 Old myocardial infarction: Secondary | ICD-10-CM | POA: Diagnosis not present

## 2018-10-29 DIAGNOSIS — A040 Enteropathogenic Escherichia coli infection: Secondary | ICD-10-CM | POA: Diagnosis not present

## 2018-10-29 DIAGNOSIS — D500 Iron deficiency anemia secondary to blood loss (chronic): Secondary | ICD-10-CM | POA: Diagnosis not present

## 2018-10-29 DIAGNOSIS — K219 Gastro-esophageal reflux disease without esophagitis: Secondary | ICD-10-CM | POA: Diagnosis not present

## 2018-10-29 DIAGNOSIS — Z6824 Body mass index (BMI) 24.0-24.9, adult: Secondary | ICD-10-CM | POA: Diagnosis not present

## 2018-10-29 DIAGNOSIS — M109 Gout, unspecified: Secondary | ICD-10-CM | POA: Diagnosis not present

## 2018-10-29 DIAGNOSIS — Z992 Dependence on renal dialysis: Secondary | ICD-10-CM | POA: Diagnosis not present

## 2018-10-29 DIAGNOSIS — E785 Hyperlipidemia, unspecified: Secondary | ICD-10-CM | POA: Diagnosis not present

## 2018-10-29 DIAGNOSIS — Z7982 Long term (current) use of aspirin: Secondary | ICD-10-CM | POA: Diagnosis not present

## 2018-10-29 DIAGNOSIS — D631 Anemia in chronic kidney disease: Secondary | ICD-10-CM | POA: Diagnosis not present

## 2018-10-29 DIAGNOSIS — D649 Anemia, unspecified: Secondary | ICD-10-CM

## 2018-10-29 DIAGNOSIS — K921 Melena: Secondary | ICD-10-CM | POA: Diagnosis not present

## 2018-10-29 DIAGNOSIS — I5030 Unspecified diastolic (congestive) heart failure: Secondary | ICD-10-CM | POA: Diagnosis not present

## 2018-10-29 MED ORDER — DIPHENHYDRAMINE HCL 25 MG PO CAPS
25.0000 mg | ORAL_CAPSULE | Freq: Once | ORAL | Status: AC
  Administered 2018-10-29: 25 mg via ORAL

## 2018-10-29 MED ORDER — ACETAMINOPHEN 325 MG PO TABS
ORAL_TABLET | ORAL | Status: AC
  Filled 2018-10-29: qty 2

## 2018-10-29 MED ORDER — SODIUM CHLORIDE 0.9% IV SOLUTION
250.0000 mL | Freq: Once | INTRAVENOUS | Status: AC
  Administered 2018-10-29: 10:00:00 250 mL via INTRAVENOUS
  Filled 2018-10-29: qty 250

## 2018-10-29 MED ORDER — ACETAMINOPHEN 325 MG PO TABS
650.0000 mg | ORAL_TABLET | Freq: Once | ORAL | Status: AC
  Administered 2018-10-29: 650 mg via ORAL

## 2018-10-29 MED ORDER — DIPHENHYDRAMINE HCL 25 MG PO CAPS
ORAL_CAPSULE | ORAL | Status: AC
  Filled 2018-10-29: qty 1

## 2018-10-29 NOTE — Telephone Encounter (Signed)
This nurse attempted to call patient in order to set up hospital follow appointment. Message was left on voicemail to call us back.

## 2018-10-29 NOTE — Patient Instructions (Signed)
Blood Transfusion, Adult, Care After This sheet gives you information about how to care for yourself after your procedure. Your doctor may also give you more specific instructions. If you have problems or questions, contact your doctor. Follow these instructions at home:   Take over-the-counter and prescription medicines only as told by your doctor.  Go back to your normal activities as told by your doctor.  Follow instructions from your doctor about how to take care of the area where an IV tube was put into your vein (insertion site). Make sure you: ? Wash your hands with soap and water before you change your bandage (dressing). If there is no soap and water, use hand sanitizer. ? Change your bandage as told by your doctor.  Check your IV insertion site every day for signs of infection. Check for: ? More redness, swelling, or pain. ? More fluid or blood. ? Warmth. ? Pus or a bad smell. Contact a doctor if:  You have more redness, swelling, or pain around the IV insertion site.  You have more fluid or blood coming from the IV insertion site.  Your IV insertion site feels warm to the touch.  You have pus or a bad smell coming from the IV insertion site.  Your pee (urine) turns pink, red, or brown.  You feel weak after doing your normal activities. Get help right away if:  You have signs of a serious allergic or body defense (immune) system reaction, including: ? Itchiness. ? Hives. ? Trouble breathing. ? Anxiety. ? Pain in your chest or lower back. ? Fever, flushing, and chills. ? Fast pulse. ? Rash. ? Watery poop (diarrhea). ? Throwing up (vomiting). ? Dark pee. ? Serious headache. ? Dizziness. ? Stiff neck. ? Yellow color in your face or the white parts of your eyes (jaundice). Summary  After a blood transfusion, return to your normal activities as told by your doctor.  Every day, check for signs of infection where the IV tube was put into your vein.  Some  signs of infection are warm skin, more redness and pain, more fluid or blood, and pus or a bad smell where the needle went in.  Contact your doctor if you feel weak or have any unusual symptoms. This information is not intended to replace advice given to you by your health care provider. Make sure you discuss any questions you have with your health care provider. Document Released: 02/20/2014 Document Revised: 06/06/2017 Document Reviewed: 09/24/2015 Elsevier Patient Education  2020 Elsevier Inc.  

## 2018-10-29 NOTE — Progress Notes (Signed)
Called daughter [name] per pt request to alert her to pick up time today after blood transfusions.  Verbalized understanding.  Requested information about financial assistance as well.  RN Diane made aware to f/u on request.  Pt received 2 units PRBCs today, tolerated well.  VSS.  Pt refused any food or drink during infusion today.  Slept for both infusions.  Denied needing to use the restroom at any time.  Denied pain or any other needs throughout visit.  Denies questions or concerns at time of d/c.  Escorted via wc to lobby with belongings, daughter on the way to take pt home.

## 2018-10-30 DIAGNOSIS — D509 Iron deficiency anemia, unspecified: Secondary | ICD-10-CM | POA: Diagnosis not present

## 2018-10-30 DIAGNOSIS — D631 Anemia in chronic kidney disease: Secondary | ICD-10-CM | POA: Diagnosis not present

## 2018-10-30 DIAGNOSIS — N186 End stage renal disease: Secondary | ICD-10-CM | POA: Diagnosis not present

## 2018-10-30 DIAGNOSIS — Z23 Encounter for immunization: Secondary | ICD-10-CM | POA: Diagnosis not present

## 2018-10-30 DIAGNOSIS — Z992 Dependence on renal dialysis: Secondary | ICD-10-CM | POA: Diagnosis not present

## 2018-10-30 DIAGNOSIS — N2581 Secondary hyperparathyroidism of renal origin: Secondary | ICD-10-CM | POA: Diagnosis not present

## 2018-10-30 LAB — TYPE AND SCREEN
ABO/RH(D): O POS
Antibody Screen: NEGATIVE
Unit division: 0
Unit division: 0

## 2018-10-30 LAB — BPAM RBC
Blood Product Expiration Date: 202010142359
Blood Product Expiration Date: 202010152359
ISSUE DATE / TIME: 202009150938
ISSUE DATE / TIME: 202009150938
Unit Type and Rh: 5100
Unit Type and Rh: 5100

## 2018-10-31 ENCOUNTER — Other Ambulatory Visit: Payer: Self-pay

## 2018-10-31 ENCOUNTER — Other Ambulatory Visit: Payer: Medicare Other | Admitting: *Deleted

## 2018-10-31 ENCOUNTER — Other Ambulatory Visit: Payer: Medicare Other | Admitting: Licensed Clinical Social Worker

## 2018-10-31 ENCOUNTER — Encounter: Payer: Self-pay | Admitting: Gastroenterology

## 2018-10-31 ENCOUNTER — Other Ambulatory Visit (INDEPENDENT_AMBULATORY_CARE_PROVIDER_SITE_OTHER): Payer: Medicare Other

## 2018-10-31 ENCOUNTER — Ambulatory Visit (INDEPENDENT_AMBULATORY_CARE_PROVIDER_SITE_OTHER): Payer: Medicare Other | Admitting: Nurse Practitioner

## 2018-10-31 ENCOUNTER — Encounter: Payer: Self-pay | Admitting: Nurse Practitioner

## 2018-10-31 VITALS — BP 120/60 | HR 72 | Temp 98.0°F | Ht 69.0 in | Wt 170.0 lb

## 2018-10-31 DIAGNOSIS — Z515 Encounter for palliative care: Secondary | ICD-10-CM

## 2018-10-31 DIAGNOSIS — A09 Infectious gastroenteritis and colitis, unspecified: Secondary | ICD-10-CM | POA: Diagnosis not present

## 2018-10-31 DIAGNOSIS — Z86010 Personal history of colonic polyps: Secondary | ICD-10-CM

## 2018-10-31 DIAGNOSIS — K922 Gastrointestinal hemorrhage, unspecified: Secondary | ICD-10-CM

## 2018-10-31 LAB — CBC
HCT: 23.3 % — CL (ref 36.0–46.0)
Hemoglobin: 7.8 g/dL — CL (ref 12.0–15.0)
MCHC: 33.6 g/dL (ref 30.0–36.0)
MCV: 99 fl (ref 78.0–100.0)
Platelets: 174 10*3/uL (ref 150.0–400.0)
RBC: 2.35 Mil/uL — ABNORMAL LOW (ref 3.87–5.11)
RDW: 17.2 % — ABNORMAL HIGH (ref 11.5–15.5)
WBC: 5.3 10*3/uL (ref 4.0–10.5)

## 2018-10-31 NOTE — Progress Notes (Signed)
COMMUNITY PALLIATIVE CARE SW NOTE  PATIENT NAME: Jessica Clarke DOB: 07-Jul-1950 MRN: [id number]  PRIMARY CARE PROVIDER: Glendale Chard, MD  RESPONSIBLE PARTY:  Acct ID - Guarantor Home Phone Work Phone Relationship Acct Type  [id number] Wilhelmina [phone]  Self P/F     [address], [place], [place] [address]     PLAN OF CARE and INTERVENTIONS:             1. GOALS OF CARE/ ADVANCE CARE PLANNING:  Patient's goal is to get stronger, not be hospitalized and to be pain free.  She is a full code. 2. SOCIAL/EMOTIONAL/SPIRITUAL ASSESSMENT/ INTERVENTIONS:  SW and Palliative Care RN, Daryl Eastern, met with patient and her daughter, [name], in [place] home.  Patient has been staying with her since her release from the hospital.  Patient was sitting up on her hospital bed eating lunch independently.  [name] inquired about POA, LW and HCPOA. SW to follow up with additional information.  MOST form will be introduced during the next visit.  Patient was alert and oriented and denied pain.  She appeared to have a good relationship with her only child.  Patient is one of eleven children.  Three of her siblings are deceased.  Patient's mother is 59 y/o and lives about three and a half hours away.  Patient still talks to her on the phone.  [name] disclosed she had her own health concerns.  SW provided active listening and supportive counseling. 3. PATIENT/CAREGIVER EDUCATION/ COPING:  Provided education regarding Palliative Care.  Patient and her daughter stated they understood.  Both cope by using humor and expressing their feelings openly. 4. PERSONAL EMERGENCY PLAN:  Daughter will contact EMS. 5. COMMUNITY RESOURCES COORDINATION/ HEALTH CARE NAVIGATION:  Patient has PT, OT and RN home health through [facility]. 6. FINANCIAL/LEGAL CONCERNS/INTERVENTIONS:  Patient is on a fixed income.     SOCIAL HX:  Social History   Tobacco Use  . Smoking status: Current Every Day Smoker    Packs/day: 0.50   Years: 30.00    Pack years: 15.00    Types: Cigarettes  . Smokeless tobacco: Never Used  Substance Use Topics  . Alcohol use: Yes    Alcohol/week: 6.0 standard drinks    Types: 6 Cans of beer per week    CODE STATUS:  Full Code ADVANCED DIRECTIVES: No MOST FORM COMPLETE:  No HOSPICE EDUCATION PROVIDED:  Y PPS:  Patient reports her appetite varies.  She was observed ambulating in the home independently. Duration of visit and documentation:  60 minutes.      Creola Corn Avantae Bither, LCSW

## 2018-10-31 NOTE — Patient Instructions (Signed)
If you are age 70 or older, your body mass index should be between 23-30. Your Body mass index is 25.1 kg/m. If this is out of the aforementioned range listed, please consider follow up with your Primary Care Provider.  If you are age 85 or younger, your body mass index should be between 19-25. Your Body mass index is 25.1 kg/m. If this is out of the aformentioned range listed, please consider follow up with your Primary Care Provider.   Your provider has requested that you go to the basement level for lab work before leaving today. Press "B" on the elevator. The lab is located at the first door on the left as you exit the elevator.  We will call you with results.  Thank you for choosing me and [facility].   Tye Savoy, NP

## 2018-10-31 NOTE — Progress Notes (Signed)
COMMUNITY PALLIATIVE CARE RN NOTE  PATIENT NAME: Jessica Clarke DOB: [birth year] MRN: [id number]  PRIMARY CARE PROVIDER: Glendale Chard, MD  RESPONSIBLE PARTY:  Acct ID - Guarantor Home Phone Work Phone Relationship Acct Type  [id number] Wilhelmina [id number] [phone]  Self P/F     [address], [place], [place] [address]   Covid-19 Pre-screening Negative  PLAN OF CARE and INTERVENTION 1. ADVANCE CARE PLANNING/GOALS OF CARE: Goal is for patient to live a happy life without pain. She hopes her Liver CA can be treated and she wants to be more independent. She is a Full Code.  2. PATIENT/CAREGIVER EDUCATION: Explained Palliative Care services, Safe Mobility 3. DISEASE STATUS:  Joint visit made with Palliative Care SW, Lynn Duffy. Met with patient and her daughter. Daughter has brought patient to her home for now. She wants patient to become stronger and more independent before considering allowing her to return back to her home for safety purposes. Patient denies pain. She is alert and oriented x 3. Very pleasant and conversational. She reports feeling "pretty good" today. She does report feeling tired after coming from her follow up appointment with [facility] earlier today. She also had labs drawn today. She is able to ambulate independently while holding onto the walls and furniture. She does require some assistance with bathing and dressing. She has a hospital bed, shower chair and a bedside commode. She is currently receiving home health PT/OT/RN with [facility]. Her intake is variable. Daughter feels her appetite has improved over the past few days. She denies dysphagia. She was recently diagnosed with Liver CA. She says that her Oncology team are discussing possible interventions. She is on dialysis on Monday-Wednesday-Friday. Yesterday, she was experiencing some diarrhea. Patient denies any bright red blood in her stool, but it is black in appearance. Stool soft and more formed today. She is agreeable  to future visits with Palliative care. Will continue to monitor.  HISTORY OF PRESENT ILLNESS: This is a 68 yo female who currently resides with her daughter. She was recently hospitalized from 10/17/18 to 10/26/18 for c/o diarrhea and rectal bleeding. Palliative Care Team was asked to follow patient for additional support. Will visit patient monthly and PRN.   CODE STATUS: Full Code ADVANCED DIRECTIVES: N MOST FORM: no PPS: 50%   (Duration of visit and documentation 75 minutes)   Daryl Eastern, RN BSN

## 2018-11-01 ENCOUNTER — Encounter: Payer: Self-pay | Admitting: Nurse Practitioner

## 2018-11-01 DIAGNOSIS — Z992 Dependence on renal dialysis: Secondary | ICD-10-CM | POA: Diagnosis not present

## 2018-11-01 DIAGNOSIS — N2581 Secondary hyperparathyroidism of renal origin: Secondary | ICD-10-CM | POA: Diagnosis not present

## 2018-11-01 DIAGNOSIS — D631 Anemia in chronic kidney disease: Secondary | ICD-10-CM | POA: Diagnosis not present

## 2018-11-01 DIAGNOSIS — N186 End stage renal disease: Secondary | ICD-10-CM | POA: Diagnosis not present

## 2018-11-01 DIAGNOSIS — D509 Iron deficiency anemia, unspecified: Secondary | ICD-10-CM | POA: Diagnosis not present

## 2018-11-01 DIAGNOSIS — Z23 Encounter for immunization: Secondary | ICD-10-CM | POA: Diagnosis not present

## 2018-11-01 NOTE — Progress Notes (Signed)
Reviewed. I agree with documentation including the assessment and plan.  Jackson Coffield L. Tarri Glenn, MD, MPH

## 2018-11-01 NOTE — Progress Notes (Signed)
Chief Complaint:      IMPRESSION and PLAN:    63. 68 yo female recently hospitalized with infectious colitis -Enteropathogenic E.coli  She had bloody diarrhea resulting in a drop in hgb from 10.9 >>> 6.6. Inpatient colonoscopy showed friable patches of mucosa throughout colon but biopsies negative.  -stools still loose, dark on iron. Her hgb has declined since hospital discharge 8.5 >>> 6.0. She received 2 units of PRBC yesterday which Oncology ordered. Will repeat cbc today.   She could be bleeding from an GI upper source  2. Suspicious RUL nodule. Oncology followed and suspect non-small cell lung cancer   3. Hx of colon polyps, due for recall colonoscopy in 2027 ( if medically appropriate)  HPI:     Patient is a 68 yo female with PMH significant for lung cancer, HTN, CAD, CHF, ESRD on HD (MWF), Raynaud's, anemia of chronic disease and hx of colon polyps. She was known years ago to Dr. Deatra Ina, not seen in office in several years.   09/29/18 seen in ED with dark red stools. Hgb okay at 10.9. Non-contrast CT AP suggested mild colonic wall thickening involving cecum / ascending colon. Discharged from ED on Amoxicillin for treatment of colitis.   9.4.20 admitted to hospital with diarrhea, rectal bleeding and symptomatic anemia ( hgb 10.9 >>> 6.6).  Repeat CT AP again suggested possible ascending colitis. She was transfused 2uPRBC and underwent colonoscopy which revealed patches of friable mucosa throughout the colon BUT random colon biopsies were normal. An adenomatous polyp was removed.  GI path panel was positive for Enteropathogenic E.coli.   Patient is here with her daughter for hospital follow up. Stools still loose, dark ( iron). No abdominal pain. She saw Oncologist yesterday about the suspicious lung nodule. She had been undergoing evaluation of the nodule several months ago but was lost to follow up. Nodule suspicious for stage 1A non-small cell lung cancer. Evaluation ongoing.  Her hgb was noted to have declined from 8.5 on 10/25/18 to 6. Oncology transfused her 2uPRBC.    Review of systems:     No chest pain, no SOB, no fevers, no urinary sx   Past Medical History:  Diagnosis Date   Abnormal Pap smear    ASCUS ?HGSIL   Anemia    Cervical dysplasia    CHF (congestive heart failure) (HCC)    Chronic kidney disease    Constipation    Constipation    GERD (gastroesophageal reflux disease)    Gout    Hyperlipidemia    Hypertension    Myocardial infarction ([facility]) 2015   Obesity    Pneumonia    Uterine polyp     Patient's surgical history, family medical history, social history, medications and allergies were all reviewed in Epic   Serum creatinine: 4.5 mg/dL [facility]) 10/28/18 1347 Estimated creatinine clearance: 12.3 mL/min (A)  Current Outpatient Medications  Medication Sig Dispense Refill   acetaminophen (TYLENOL) 325 MG tablet Take 325-650 mg by mouth every 8 (eight) hours as needed for mild pain.      allopurinol (ZYLOPRIM) 100 MG tablet Take 100 mg by mouth daily.       Amino Acids-Protein Hydrolys (FEEDING SUPPLEMENT, PRO-STAT SUGAR FREE 64,) LIQD Take 30 mLs by mouth 2 (two) times daily. 887 mL 0   aspirin EC 325 MG tablet Take 1 tablet (325 mg total) by mouth daily. 30 tablet 0   atenolol (TENORMIN) 100 MG tablet Take by mouth.  atorvastatin (LIPITOR) 10 MG tablet Take 10 mg by mouth at bedtime.   0   B Complex-Folic Acid (B COMPLEX FORMULA 1) TABS Take by mouth.     calcitRIOL (ROCALTROL) 0.5 MCG capsule Take 2 capsules (1 mcg total) by mouth every Monday, Wednesday, and Friday with hemodialysis. 30 capsule 0   cinacalcet (SENSIPAR) 30 MG tablet Take 1 tablet (30 mg total) by mouth daily with supper. 60 tablet 0   colchicine 0.6 MG tablet Take 0.6 mg by mouth daily as needed (as directed for gout flares or pain).      Darbepoetin Alfa (ARANESP) 60 MCG/0.3ML SOSY injection Inject 0.3 mLs (60 mcg total) into the vein every  Friday with hemodialysis. 4.2 mL    hydrocerin (EUCERIN) CREA Apply 1 application topically 2 (two) times daily. 228 g 0   loperamide (IMODIUM) 2 MG capsule Take by mouth.     midodrine (PROAMATINE) 10 MG tablet Take 1 tablet (10 mg total) by mouth 2 (two) times daily with a meal. 60 tablet 0   Multiple Vitamin (MULTIVITAMIN) capsule Take by mouth.     multivitamin (RENA-VIT) TABS tablet Take 1 tablet by mouth at bedtime. 30 tablet 0   nicotine (NICODERM CQ - DOSED IN MG/24 HOURS) 14 mg/24hr patch Place 1 patch (14 mg total) onto the skin daily as needed (smoking craving). 28 patch 0   thiamine (VITAMIN B-1) 100 MG tablet Take 100 mg by mouth daily.     VELPHORO 500 MG chewable tablet Chew 2 tablets by mouth 3 (three) times daily with meals.      VITAMIN D, CHOLECALCIFEROL, PO Take by mouth.     heparin 1000 UNIT/ML injection Heparin Sodium (Porcine) 1,000 Units/mL Systemic     No current facility-administered medications for this visit.     Physical Exam:     BP 120/60    Pulse 72    Temp 98 F (36.7 C)    Ht 5\' 9"  (1.753 m)    Wt 170 lb (77.1 kg)    BMI 25.10 kg/m   GENERAL:  Pleasant frail female in wheelchair in NAD PSYCH: : Cooperative, normal affect EENT:  conjunctiva pink, mucous membranes moist, neck supple without masses CARDIAC:  RRR, , no peripheral edema PULM: Normal respiratory effort, lungs CTA bilaterally, no wheezing ABDOMEN:  Nondistended, soft, nontender, normal bowel sounds SKIN:  turgor, no lesions seen Musculoskeletal:  Normal muscle tone, normal strength NEURO: Alert and oriented x 3, no focal neurologic deficits   Tye Savoy , NP 11/01/2018, 1:18 PM

## 2018-11-03 DIAGNOSIS — K921 Melena: Secondary | ICD-10-CM | POA: Diagnosis not present

## 2018-11-03 DIAGNOSIS — N186 End stage renal disease: Secondary | ICD-10-CM | POA: Diagnosis not present

## 2018-11-03 DIAGNOSIS — I5030 Unspecified diastolic (congestive) heart failure: Secondary | ICD-10-CM | POA: Diagnosis not present

## 2018-11-03 DIAGNOSIS — I132 Hypertensive heart and chronic kidney disease with heart failure and with stage 5 chronic kidney disease, or end stage renal disease: Secondary | ICD-10-CM | POA: Diagnosis not present

## 2018-11-03 DIAGNOSIS — D500 Iron deficiency anemia secondary to blood loss (chronic): Secondary | ICD-10-CM | POA: Diagnosis not present

## 2018-11-03 DIAGNOSIS — A040 Enteropathogenic Escherichia coli infection: Secondary | ICD-10-CM | POA: Diagnosis not present

## 2018-11-04 ENCOUNTER — Ambulatory Visit: Payer: Medicare Other | Admitting: Physician Assistant

## 2018-11-04 ENCOUNTER — Telehealth: Payer: Self-pay | Admitting: Internal Medicine

## 2018-11-04 DIAGNOSIS — Z23 Encounter for immunization: Secondary | ICD-10-CM | POA: Diagnosis not present

## 2018-11-04 DIAGNOSIS — D631 Anemia in chronic kidney disease: Secondary | ICD-10-CM | POA: Diagnosis not present

## 2018-11-04 DIAGNOSIS — N2581 Secondary hyperparathyroidism of renal origin: Secondary | ICD-10-CM | POA: Diagnosis not present

## 2018-11-04 DIAGNOSIS — N186 End stage renal disease: Secondary | ICD-10-CM | POA: Diagnosis not present

## 2018-11-04 DIAGNOSIS — Z992 Dependence on renal dialysis: Secondary | ICD-10-CM | POA: Diagnosis not present

## 2018-11-04 DIAGNOSIS — D509 Iron deficiency anemia, unspecified: Secondary | ICD-10-CM | POA: Diagnosis not present

## 2018-11-04 NOTE — Telephone Encounter (Signed)
I left a message asking the pt to call me at [phone] to schedule AWV with Nicke[facility]. VDM (DD)

## 2018-11-05 ENCOUNTER — Other Ambulatory Visit: Payer: Self-pay

## 2018-11-05 ENCOUNTER — Inpatient Hospital Stay (HOSPITAL_BASED_OUTPATIENT_CLINIC_OR_DEPARTMENT_OTHER): Payer: Medicare Other | Admitting: Physician Assistant

## 2018-11-05 ENCOUNTER — Telehealth: Payer: Self-pay | Admitting: *Deleted

## 2018-11-05 ENCOUNTER — Encounter: Payer: Self-pay | Admitting: Physician Assistant

## 2018-11-05 VITALS — BP 134/74 | HR 78 | Temp 98.5°F | Resp 18 | Ht 69.0 in | Wt 157.8 lb

## 2018-11-05 DIAGNOSIS — I5030 Unspecified diastolic (congestive) heart failure: Secondary | ICD-10-CM | POA: Diagnosis not present

## 2018-11-05 DIAGNOSIS — R918 Other nonspecific abnormal finding of lung field: Secondary | ICD-10-CM | POA: Insufficient documentation

## 2018-11-05 DIAGNOSIS — N186 End stage renal disease: Secondary | ICD-10-CM

## 2018-11-05 DIAGNOSIS — K921 Melena: Secondary | ICD-10-CM | POA: Diagnosis not present

## 2018-11-05 DIAGNOSIS — Z79899 Other long term (current) drug therapy: Secondary | ICD-10-CM | POA: Diagnosis not present

## 2018-11-05 DIAGNOSIS — D649 Anemia, unspecified: Secondary | ICD-10-CM

## 2018-11-05 DIAGNOSIS — A040 Enteropathogenic Escherichia coli infection: Secondary | ICD-10-CM | POA: Diagnosis not present

## 2018-11-05 DIAGNOSIS — Z7982 Long term (current) use of aspirin: Secondary | ICD-10-CM | POA: Diagnosis not present

## 2018-11-05 DIAGNOSIS — D631 Anemia in chronic kidney disease: Secondary | ICD-10-CM | POA: Diagnosis not present

## 2018-11-05 DIAGNOSIS — Z992 Dependence on renal dialysis: Secondary | ICD-10-CM | POA: Diagnosis not present

## 2018-11-05 DIAGNOSIS — I132 Hypertensive heart and chronic kidney disease with heart failure and with stage 5 chronic kidney disease, or end stage renal disease: Secondary | ICD-10-CM | POA: Diagnosis not present

## 2018-11-05 DIAGNOSIS — D500 Iron deficiency anemia secondary to blood loss (chronic): Secondary | ICD-10-CM | POA: Diagnosis not present

## 2018-11-05 NOTE — Telephone Encounter (Signed)
Per [name] Hellingotter PA, scheduled pt for CT without contrast on 9/29 @ 2pm. Pt is to arrive 15-30 min prior to appt. While at appt pt was made aware of date and time of CT appt. And verbalized understanding.

## 2018-11-05 NOTE — Patient Instructions (Addendum)
Oral: Initial: 4 mg, followed by 2 mg after each loose stool (maximum: 16 mg/day  -For your CT scan scheduled on 9/29, please go to the main [facility]. Once inside, head up to the main desk and ask for someone to help get you to the radiology department for a CT scan. Arrive early to the scan.

## 2018-11-05 NOTE — Progress Notes (Signed)
[facility] OFFICE PROGRESS NOTE  [address], [address] [place] [address]  DIAGNOSIS: Hypermetabolic right upper lobe pulmonary nodule suspicious for lung cancer.  PRIOR THERAPY: None  CURRENT THERAPY: None  INTERVAL HISTORY: Jessica Clarke 68 y.o. female returns to the clinic today for a follow-up visit.  The patient was lost to follow-up since January 2020.  At that time, she had a PET scan that was suspicious for right upper lobe primary lung malignancy.  The patient was supposed to have pulmonary function test and follow-up after her last visit in January but she was lost to follow-up since that time.  She was recently admitted to the hospital with rectal bleeding and severe anemia.  She was treated for diverticulitis.  She received 3 units of PRBCs transfusion during her hospitalization.  She is currently on hemodialysis Monday, Wednesday and Friday followed by Dr. Justin Clarke. Our office reached out to Dr. 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 The patients was supposed to have a staging CT of the chest performed for her appointment today; however, it appears that it has not been performed or scheduled at this time.   The patient is feeling fair today without any concerning complaints. Her daughter was available by phone during the visit. At the patient's last appointment, she continued to have anemia and subsequently received 2 units of RBCs. She states that she felt better after receiving her blood transfusion. The patient reports she still experiences fatigue "now and then". She denies any recent fevers, chills, or night sweats. The patient states that she is eating well but chart review notes weight loss from her prior visit, supposedly 13 lbs. She notes baseline shortness of breath but denies cough, chest pain, or hemoptysis. She denies nausea, vomiting, or constipation. She notes frequent diarrhea. She  has imodium and states that it helps but she was unclear as to how much you can take of imodium per day She notes some worsening blurry vision but denies any headaches. She recently had a brain MRI performed on 10/24/2018 which was negative for any evidence of metastatic disease. She is here for evaluation and discussion of her next steps as part of her staging workup.   MEDICAL HISTORY: Past Medical History:  Diagnosis Date  . Abnormal Pap smear    ASCUS ?HGSIL  . Anemia   . Cervical dysplasia   . CHF (congestive heart failure) ([place])   . Chronic kidney disease   . Constipation   . Constipation   . GERD (gastroesophageal reflux disease)   . Gout   . Hyperlipidemia   . Hypertension   . Myocardial infarction ([place]) 2015  . Obesity   . Pneumonia   . Uterine polyp     ALLERGIES:  has No Known Allergies.  MEDICATIONS:  Current Outpatient Medications  Medication Sig Dispense Refill  . acetaminophen (TYLENOL) 325 MG tablet Take 325-650 mg by mouth every 8 (eight) hours as needed for mild pain.     Marland Kitchen allopurinol (ZYLOPRIM) 100 MG tablet Take 100 mg by mouth daily.      . Amino Acids-Protein Hydrolys (FEEDING SUPPLEMENT, PRO-STAT SUGAR FREE 64,) LIQD Take 30 mLs by mouth 2 (two) times daily. 887 mL 0  . aspirin EC 325 MG tablet Take 1 tablet (325 mg total) by mouth daily. 30 tablet 0  . atenolol (TENORMIN) 100 MG tablet Take by mouth.    Marland Kitchen atorvastatin (LIPITOR) 10  MG tablet Take 10 mg by mouth at bedtime.   0  . B Complex-Folic Acid (B COMPLEX FORMULA 1) TABS Take by mouth.    . calcitRIOL (ROCALTROL) 0.5 MCG capsule Take 2 capsules (1 mcg total) by mouth every Monday, Wednesday, and Friday with hemodialysis. 30 capsule 0  . cinacalcet (SENSIPAR) 30 MG tablet Take 1 tablet (30 mg total) by mouth daily with supper. 60 tablet 0  . colchicine 0.6 MG tablet Take 0.6 mg by mouth daily as needed (as directed for gout flares or pain).     . Darbepoetin Alfa (ARANESP) 60 MCG/0.3ML SOSY injection  Inject 0.3 mLs (60 mcg total) into the vein every Friday with hemodialysis. 4.2 mL   . heparin 1000 UNIT/ML injection Heparin Sodium (Porcine) 1,000 Units/mL Systemic    . hydrocerin (EUCERIN) CREA Apply 1 application topically 2 (two) times daily. 228 g 0  . loperamide (IMODIUM) 2 MG capsule Take by mouth.    . midodrine (PROAMATINE) 10 MG tablet Take 1 tablet (10 mg total) by mouth 2 (two) times daily with a meal. 60 tablet 0  . Multiple Vitamin (MULTIVITAMIN) capsule Take by mouth.    . multivitamin (RENA-VIT) TABS tablet Take 1 tablet by mouth at bedtime. 30 tablet 0  . nicotine (NICODERM CQ - DOSED IN MG/24 HOURS) 14 mg/24hr patch Place 1 patch (14 mg total) onto the skin daily as needed (smoking craving). 28 patch 0  . thiamine (VITAMIN B-1) 100 MG tablet Take 100 mg by mouth daily.    . VELPHORO 500 MG chewable tablet Chew 2 tablets by mouth 3 (three) times daily with meals.     Marland Kitchen VITAMIN D, CHOLECALCIFEROL, PO Take by mouth.     No current facility-administered medications for this visit.     SURGICAL HISTORY:  Past Surgical History:  Procedure Laterality Date  . AV FISTULA PLACEMENT  01/18/2012   Procedure: ARTERIOVENOUS (AV) FISTULA CREATION;  Surgeon: Angelia Mould, MD;  Location: [facility];  Service: Vascular;  Laterality: Left;  . BIOPSY  10/20/2018   Procedure: BIOPSY;  Surgeon: Thornton Park, MD;  Location: [facility];  Service: Gastroenterology;;  . COLONOSCOPY WITH PROPOFOL N/A 10/20/2018   Procedure: COLONOSCOPY WITH PROPOFOL;  Surgeon: Thornton Park, MD;  Location: [facility];  Service: Gastroenterology;  Laterality: N/A;  . DILATION AND CURETTAGE OF UTERUS    . HYSTEROSCOPY    . LEEP    . LEFT HEART CATHETERIZATION WITH CORONARY ANGIOGRAM N/A 08/26/2013   Procedure: LEFT HEART CATHETERIZATION WITH CORONARY ANGIOGRAM;  Surgeon: Leonie Man, MD;  Location: [facility] CATH LAB;  Service: Cardiovascular;  Laterality: N/A;  . POLYPECTOMY  10/20/2018   Procedure:  POLYPECTOMY;  Surgeon: Thornton Park, MD;  Location: [facility];  Service: Gastroenterology;;  . REVISON OF ARTERIOVENOUS FISTULA Left 07/11/4130   Procedure: PLICATION OF ARTERIOVENOUS FISTULA;  Surgeon: Conrad [place] Manchester, MD;  Location: [facility];  Service: Vascular;  Laterality: Left;    REVIEW OF SYSTEMS:   Review of Systems  Constitutional: Positive for fatigue and weight loss. Negative for appetite change, chills, and fever HENT: Negative for mouth sores, nosebleeds, sore throat and trouble swallowing.   Eyes: Positive for blurry vision. Negative for icterus.  Respiratory: Positive for occasional shortness of breath. Negative for cough, hemoptysis, and wheezing.   Cardiovascular: Negative for chest pain and leg swelling.  Gastrointestinal: Positive for frequent diarrhea. Negative for abdominal pain, constipation, nausea and vomiting.  Genitourinary: Negative for bladder incontinence, difficulty urinating, dysuria, frequency  and hematuria.   Musculoskeletal: Negative for back pain, gait problem, neck pain and neck stiffness.  Skin: Negative for itching and rash.  Neurological: Negative for dizziness, extremity weakness, gait problem, headaches, light-headedness and seizures.  Hematological: Negative for adenopathy. Does not bruise/bleed easily.  Psychiatric/Behavioral: Negative for confusion, depression and sleep disturbance. The patient is not nervous/anxious.     PHYSICAL EXAMINATION:  Blood pressure 134/74, pulse 78, temperature 98.5 F (36.9 C), temperature source Temporal, resp. rate 18, height 5\' 9"  (1.753 m), weight 157 lb 12.8 oz (71.6 kg), SpO2 100 %.  ECOG PERFORMANCE STATUS: 2 - Symptomatic, <50% confined to bed  Physical Exam  Constitutional: Oriented to person, place, and time and chronically ill appearing female and in no distress.  HENT:  Head: Normocephalic and atraumatic.  Mouth/Throat: Oropharynx is clear and moist. No oropharyngeal exudate.  Eyes: Conjunctivae are  normal. Right eye exhibits no discharge. Left eye exhibits no discharge. No scleral icterus. Arcus senilis noted bilaterally.  Neck: Normal range of motion. Neck supple.  Cardiovascular: Murmur noted. Normal rate, regular rhythm, sounds and intact distal pulses.   Pulmonary/Chest: Crackles noted bilaterally at the base of the lungs. Effort normal. No respiratory distress. No wheezes. No rales.  Abdominal: Soft. Bowel sounds are normal. Exhibits no distension and no mass. There is no tenderness.  Musculoskeletal: Normal range of motion. Exhibits no edema.  Lymphadenopathy:    No cervical adenopathy.  Neurological: Alert and oriented to person, place, and time. Exhibits normal muscle tone. Gait normal. Coordination normal.  Skin: Skin is warm and dry. No rash noted. Not diaphoretic. No erythema. No pallor.  Psychiatric: Mood, memory and judgment normal.  Vitals reviewed.  LABORATORY DATA: Lab Results  Component Value Date   WBC 5.3 10/31/2018   HGB 7.8 Repeated and verified X2. (LL) 10/31/2018   HCT 23.3 (LL) 10/31/2018   MCV 99.0 10/31/2018   PLT 174.0 10/31/2018      Chemistry      Component Value Date/Time   NA 129 (L) 10/28/2018 1347   K 4.7 10/28/2018 1347   CL 95 (L) 10/28/2018 1347   CO2 27 10/28/2018 1347   BUN 74 (H) 10/28/2018 1347   CREATININE 4.50 (HH) 10/28/2018 1347      Component Value Date/Time   CALCIUM 8.0 (L) 10/28/2018 1347   CALCIUM 9.0 10/22/2009 2253   ALKPHOS 205 (H) 10/28/2018 1347   AST 22 10/28/2018 1347   ALT 9 10/28/2018 1347   BILITOT 0.5 10/28/2018 1347       RADIOGRAPHIC STUDIES:  Ct Abdomen Pelvis Wo Contrast  Result Date: 10/18/2018 CLINICAL DATA:  Follow-up colitis EXAM: CT ABDOMEN AND PELVIS WITHOUT CONTRAST TECHNIQUE: Multidetector CT imaging of the abdomen and pelvis was performed following the standard protocol without IV contrast. COMPARISON:  09/30/2018 FINDINGS: Lower chest: Small right-sided pleural effusion is noted which has  increased slightly in the interval from the prior exam. No focal confluent infiltrate is seen. Cardiomegaly is noted. Hepatobiliary: No focal liver abnormality is seen. No gallstones, gallbladder wall thickening, or biliary dilatation. Pancreas: Unremarkable. No pancreatic ductal dilatation or surrounding inflammatory changes. Spleen: Normal in size without focal abnormality. Adrenals/Urinary Tract: Adrenal glands are stable in appearance. The native kidneys are atrophic. The bladder is decompressed. No obstructive changes are seen. Stomach/Bowel: Diverticulosis is noted without evidence of definitive diverticulitis. No obstructive changes are seen. Persistent wall thickening in the ascending colon is noted again suspicious for colitis. The appendix is within normal limits. No small bowel abnormality  is seen. The stomach is unremarkable. Vascular/Lymphatic: Aortic atherosclerosis. No enlarged abdominal or pelvic lymph nodes. Reproductive: Uterus and bilateral adnexa are unremarkable. Other: Mild ascites is noted as well as changes of anasarca these changes are similar to that seen on the prior exam and may contribute somewhat to the colonic edema. Musculoskeletal: Degenerative changes of the lumbar spine are noted. IMPRESSION: Stable edematous changes in the ascending colon consistent with colitis although the mild ascites and third spacing of fluid may contribute to this abnormality. Changes of anasarca and mild ascites. Slight increase in right-sided pleural effusion when compare with the prior exam. Electronically Signed   By: Inez Catalina M.D.   On: 10/18/2018 06:33   Ct Head Wo Contrast  Result Date: 10/19/2018 CLINICAL DATA:  Fall, hit head EXAM: CT HEAD WITHOUT CONTRAST TECHNIQUE: Contiguous axial images were obtained from the base of the skull through the vertex without intravenous contrast. COMPARISON:  None. FINDINGS: Brain: There is atrophy and chronic small vessel disease changes. No acute  intracranial abnormality. Specifically, no hemorrhage, hydrocephalus, mass lesion, acute infarction, or significant intracranial injury. Vascular: No hyperdense vessel or unexpected calcification. Skull: No acute calvarial abnormality. Sinuses/Orbits: Visualized paranasal sinuses and mastoids clear. Orbital soft tissues unremarkable. Other: None IMPRESSION: Atrophy, chronic microvascular disease. No acute intracranial abnormality. Electronically Signed   By: Rolm Baptise M.D.   On: 10/19/2018 02:25   Mr Brain Wo Contrast  Result Date: 10/24/2018 CLINICAL DATA:  Encephalopathy. Altered level of consciousness. History of lung cancer. EXAM: MRI HEAD WITHOUT CONTRAST TECHNIQUE: Multiplanar, multiecho pulse sequences of the brain and surrounding structures were obtained without intravenous contrast. COMPARISON:  CT head 10/19/2018 FINDINGS: Brain: Mild atrophy. Negative for hydrocephalus. Negative for acute infarct. Several tiny white matter hyper intensities bilaterally. Negative for hemorrhage or mass. No evidence of metastatic disease on unenhanced images. Vascular: Normal arterial flow voids. Skull and upper cervical spine: Negative Sinuses/Orbits: Mild mucosal edema paranasal sinuses.  Normal orbit Other: None IMPRESSION: No acute abnormality.  No acute infarct or metastatic disease Atrophy and minimal chronic white matter change. Electronically Signed   By: Franchot Gallo M.D.   On: 10/24/2018 16:42   Dg Chest Port 1 View  Result Date: 10/22/2018 CLINICAL DATA:  Hypotension EXAM: PORTABLE CHEST 1 VIEW COMPARISON:  10/10/2016, 12/25/2017, PET-CT from 02/26/2018 FINDINGS: Cardiac shadow is enlarged. Persistent right upper lobe nodule is seen similar to that noted on prior PET-CT. This is consistent with the patient's given clinical history. Small right pleural effusion is noted. Mild central vascular congestion is noted likely related to the underlying renal failure. No bony abnormality is seen. IMPRESSION:  Small right pleural effusion. Stable appearing right upper lobe mass. Mild vascular congestion. Electronically Signed   By: Inez Catalina M.D.   On: 10/22/2018 10:23     ASSESSMENT/PLAN:  This is a very pleasant 68 year old African-American female with highly suspicious stage IA non-small cell lung cancer presented with right upper lobe hypermetabolic nodule. The patient was initially seen in January 2020 but was lost to follow up after the patient was referred for surgical evaluation for 9 months.   The patient was seen with Dr. Julien Clarke today. The patient's CT scan for her staging workup has not been complete at this time. We have scheduled for staging CT for 11/12/2018 for reevaluation of the right upper lobe pulmonary nodule to rule out any other metastatic lesion that could have developed in this time.   We will arrange for the patient to  come back for follow-up visit in 1 week for more detailed discussion of her condition and further recommendation regarding her suspicious lung cancer.  The patient's anemia of chronic disease will be managed by her primary nephrologist, Dr. Justin Clarke.   I reviewed with the patient instructions on how to take imodium for optimal control of her diarrhea.   Regarding her weight loss, unfortunately she left the clinic before we could reweigh her. We will continue to monitor her weights closely at her future visits and will consider a referral to the nutritionist if needed.   The patient was advised to call immediately if she has any concerning symptoms in the interval. The patient voices understanding of current disease status and treatment options and is in agreement with the current care plan. All questions were answered. The patient knows to call the clinic with any problems, questions or concerns. We can certainly see the patient much sooner if necessary.  No orders of the defined types were placed in this encounter.    Jessica Clarke L Lonney Revak,  PA-C 11/05/18  ADDENDUM: Hematology/Oncology Attending: I had a face-to-face encounter with the patient today.  I recommended her care plan.  This is a very pleasant 68 years old African-American female with suspicious stage Ia lung cancer presented with hypermetabolic right upper lobe lung nodule.  The patient was supposed to have repeat CT scan of the chest before this visit but unfortunately her scan was not scheduled as planned and currently scheduled to be done on 11/12/2018. We will arrange for the patient to come back for follow-up visit at that time for discussion of her scan results and recommendation regarding treatment of her condition. For the anemia of chronic disease secondary to chronic renal failure, she received 2 units of PRBCs transfusion in the clinic last week and I recommended for the patient to follow up with her nephrologist Dr. Justin Clarke for consideration of transfusion as well as growth factor and iron infusion if needed. She was advised to call immediately if she has any other concerning symptoms in the interval.  Disclaimer: This note was dictated with voice recognition software. Similar sounding words can inadvertently be transcribed and may be missed upon review. Jessica Kempf, MD 11/05/18

## 2018-11-06 ENCOUNTER — Telehealth: Payer: Self-pay | Admitting: Internal Medicine

## 2018-11-06 ENCOUNTER — Telehealth: Payer: Self-pay

## 2018-11-06 DIAGNOSIS — D631 Anemia in chronic kidney disease: Secondary | ICD-10-CM | POA: Diagnosis not present

## 2018-11-06 DIAGNOSIS — Z992 Dependence on renal dialysis: Secondary | ICD-10-CM | POA: Diagnosis not present

## 2018-11-06 DIAGNOSIS — N2581 Secondary hyperparathyroidism of renal origin: Secondary | ICD-10-CM | POA: Diagnosis not present

## 2018-11-06 DIAGNOSIS — D509 Iron deficiency anemia, unspecified: Secondary | ICD-10-CM | POA: Diagnosis not present

## 2018-11-06 DIAGNOSIS — N186 End stage renal disease: Secondary | ICD-10-CM | POA: Diagnosis not present

## 2018-11-06 DIAGNOSIS — Z23 Encounter for immunization: Secondary | ICD-10-CM | POA: Diagnosis not present

## 2018-11-06 NOTE — Telephone Encounter (Signed)
scheduled appt per 9/22 los - unable to reach pt . Left message with appt date and time

## 2018-11-06 NOTE — Telephone Encounter (Signed)
I called patient to schedule an office visit because she has not been seen in over a year. We have received forms that need to be filled out from Home health certification and plan of care. I have left the patient a message to call the office. YRL,RMA

## 2018-11-07 DIAGNOSIS — I5030 Unspecified diastolic (congestive) heart failure: Secondary | ICD-10-CM | POA: Diagnosis not present

## 2018-11-07 DIAGNOSIS — I132 Hypertensive heart and chronic kidney disease with heart failure and with stage 5 chronic kidney disease, or end stage renal disease: Secondary | ICD-10-CM | POA: Diagnosis not present

## 2018-11-07 DIAGNOSIS — N186 End stage renal disease: Secondary | ICD-10-CM | POA: Diagnosis not present

## 2018-11-07 DIAGNOSIS — A040 Enteropathogenic Escherichia coli infection: Secondary | ICD-10-CM | POA: Diagnosis not present

## 2018-11-07 DIAGNOSIS — D500 Iron deficiency anemia secondary to blood loss (chronic): Secondary | ICD-10-CM | POA: Diagnosis not present

## 2018-11-07 DIAGNOSIS — K921 Melena: Secondary | ICD-10-CM | POA: Diagnosis not present

## 2018-11-11 ENCOUNTER — Telehealth: Payer: Self-pay

## 2018-11-11 DIAGNOSIS — Z23 Encounter for immunization: Secondary | ICD-10-CM | POA: Diagnosis not present

## 2018-11-11 DIAGNOSIS — D509 Iron deficiency anemia, unspecified: Secondary | ICD-10-CM | POA: Diagnosis not present

## 2018-11-11 DIAGNOSIS — N2581 Secondary hyperparathyroidism of renal origin: Secondary | ICD-10-CM | POA: Diagnosis not present

## 2018-11-11 DIAGNOSIS — D631 Anemia in chronic kidney disease: Secondary | ICD-10-CM | POA: Diagnosis not present

## 2018-11-11 DIAGNOSIS — N186 End stage renal disease: Secondary | ICD-10-CM | POA: Diagnosis not present

## 2018-11-11 DIAGNOSIS — Z992 Dependence on renal dialysis: Secondary | ICD-10-CM | POA: Diagnosis not present

## 2018-11-11 NOTE — Telephone Encounter (Signed)
I called patient to schedule her a hospital f/u per provider request. Left pt v/m to call the office [facility]

## 2018-11-12 ENCOUNTER — Other Ambulatory Visit: Payer: Self-pay

## 2018-11-12 ENCOUNTER — Ambulatory Visit (HOSPITAL_COMMUNITY)
Admission: RE | Admit: 2018-11-12 | Discharge: 2018-11-12 | Disposition: A | Discharge status: Home / Self Care | Payer: Medicare Other | Source: Ambulatory Visit | Attending: Internal Medicine | Admitting: Internal Medicine

## 2018-11-12 DIAGNOSIS — N186 End stage renal disease: Secondary | ICD-10-CM | POA: Diagnosis not present

## 2018-11-12 DIAGNOSIS — J90 Pleural effusion, not elsewhere classified: Secondary | ICD-10-CM | POA: Diagnosis not present

## 2018-11-12 DIAGNOSIS — R911 Solitary pulmonary nodule: Secondary | ICD-10-CM | POA: Insufficient documentation

## 2018-11-12 DIAGNOSIS — D500 Iron deficiency anemia secondary to blood loss (chronic): Secondary | ICD-10-CM | POA: Diagnosis not present

## 2018-11-12 DIAGNOSIS — I5030 Unspecified diastolic (congestive) heart failure: Secondary | ICD-10-CM | POA: Diagnosis not present

## 2018-11-12 DIAGNOSIS — K921 Melena: Secondary | ICD-10-CM | POA: Diagnosis not present

## 2018-11-12 DIAGNOSIS — I132 Hypertensive heart and chronic kidney disease with heart failure and with stage 5 chronic kidney disease, or end stage renal disease: Secondary | ICD-10-CM | POA: Diagnosis not present

## 2018-11-12 DIAGNOSIS — A040 Enteropathogenic Escherichia coli infection: Secondary | ICD-10-CM | POA: Diagnosis not present

## 2018-11-13 DIAGNOSIS — D631 Anemia in chronic kidney disease: Secondary | ICD-10-CM | POA: Diagnosis not present

## 2018-11-13 DIAGNOSIS — Z23 Encounter for immunization: Secondary | ICD-10-CM | POA: Diagnosis not present

## 2018-11-13 DIAGNOSIS — D509 Iron deficiency anemia, unspecified: Secondary | ICD-10-CM | POA: Diagnosis not present

## 2018-11-13 DIAGNOSIS — N2581 Secondary hyperparathyroidism of renal origin: Secondary | ICD-10-CM | POA: Diagnosis not present

## 2018-11-13 DIAGNOSIS — Z992 Dependence on renal dialysis: Secondary | ICD-10-CM | POA: Diagnosis not present

## 2018-11-13 DIAGNOSIS — N186 End stage renal disease: Secondary | ICD-10-CM | POA: Diagnosis not present

## 2018-11-14 ENCOUNTER — Telehealth: Payer: Self-pay | Admitting: Physician Assistant

## 2018-11-14 ENCOUNTER — Inpatient Hospital Stay: Payer: Medicare Other | Attending: Internal Medicine | Admitting: Physician Assistant

## 2018-11-14 DIAGNOSIS — K921 Melena: Secondary | ICD-10-CM | POA: Diagnosis not present

## 2018-11-14 DIAGNOSIS — I132 Hypertensive heart and chronic kidney disease with heart failure and with stage 5 chronic kidney disease, or end stage renal disease: Secondary | ICD-10-CM | POA: Diagnosis not present

## 2018-11-14 DIAGNOSIS — I5030 Unspecified diastolic (congestive) heart failure: Secondary | ICD-10-CM | POA: Diagnosis not present

## 2018-11-14 DIAGNOSIS — Z992 Dependence on renal dialysis: Secondary | ICD-10-CM | POA: Diagnosis not present

## 2018-11-14 DIAGNOSIS — R918 Other nonspecific abnormal finding of lung field: Secondary | ICD-10-CM | POA: Insufficient documentation

## 2018-11-14 DIAGNOSIS — N186 End stage renal disease: Secondary | ICD-10-CM | POA: Diagnosis not present

## 2018-11-14 DIAGNOSIS — D631 Anemia in chronic kidney disease: Secondary | ICD-10-CM | POA: Insufficient documentation

## 2018-11-14 DIAGNOSIS — D500 Iron deficiency anemia secondary to blood loss (chronic): Secondary | ICD-10-CM | POA: Diagnosis not present

## 2018-11-14 DIAGNOSIS — A040 Enteropathogenic Escherichia coli infection: Secondary | ICD-10-CM | POA: Diagnosis not present

## 2018-11-14 DIAGNOSIS — N2581 Secondary hyperparathyroidism of renal origin: Secondary | ICD-10-CM | POA: Diagnosis not present

## 2018-11-14 DIAGNOSIS — R590 Localized enlarged lymph nodes: Secondary | ICD-10-CM | POA: Insufficient documentation

## 2018-11-14 DIAGNOSIS — R197 Diarrhea, unspecified: Secondary | ICD-10-CM | POA: Insufficient documentation

## 2018-11-14 DIAGNOSIS — N2889 Other specified disorders of kidney and ureter: Secondary | ICD-10-CM | POA: Diagnosis not present

## 2018-11-14 NOTE — Telephone Encounter (Signed)
Left voicemail regarding her appointment which was scheduled for today at 11:30. Advised her to call back to reschedule.

## 2018-11-15 ENCOUNTER — Telehealth: Payer: Self-pay | Admitting: *Deleted

## 2018-11-15 DIAGNOSIS — I132 Hypertensive heart and chronic kidney disease with heart failure and with stage 5 chronic kidney disease, or end stage renal disease: Secondary | ICD-10-CM | POA: Diagnosis not present

## 2018-11-15 DIAGNOSIS — D631 Anemia in chronic kidney disease: Secondary | ICD-10-CM | POA: Diagnosis not present

## 2018-11-15 DIAGNOSIS — N2581 Secondary hyperparathyroidism of renal origin: Secondary | ICD-10-CM | POA: Diagnosis not present

## 2018-11-15 DIAGNOSIS — K921 Melena: Secondary | ICD-10-CM | POA: Diagnosis not present

## 2018-11-15 DIAGNOSIS — A040 Enteropathogenic Escherichia coli infection: Secondary | ICD-10-CM | POA: Diagnosis not present

## 2018-11-15 DIAGNOSIS — Z992 Dependence on renal dialysis: Secondary | ICD-10-CM | POA: Diagnosis not present

## 2018-11-15 DIAGNOSIS — N186 End stage renal disease: Secondary | ICD-10-CM | POA: Diagnosis not present

## 2018-11-15 DIAGNOSIS — I5030 Unspecified diastolic (congestive) heart failure: Secondary | ICD-10-CM | POA: Diagnosis not present

## 2018-11-15 DIAGNOSIS — D500 Iron deficiency anemia secondary to blood loss (chronic): Secondary | ICD-10-CM | POA: Diagnosis not present

## 2018-11-15 NOTE — Telephone Encounter (Signed)
Oncology Nurse Navigator Documentation  Oncology Nurse Navigator Flowsheets 11/15/2018  Navigator Location [facility]  Navigator Encounter Type Telephone/I received a message from [place] that patient was an on show to her appt yesterday.  I called patient to check on her.  I was unable to reach her but did leave a vm message with my name and phone number to call.   Telephone Outgoing Call  Treatment Phase Abnormal Scans  Barriers/Navigation Needs Coordination of Care;Education  Education Other  Interventions Coordination of Care;Education  Coordination of Care Other  Education Method Verbal  Acuity Level 2-Minimal Needs (1-2 Barriers Identified)  Time Spent with Patient 30

## 2018-11-18 ENCOUNTER — Telehealth: Payer: Self-pay | Admitting: Physician Assistant

## 2018-11-18 DIAGNOSIS — N2581 Secondary hyperparathyroidism of renal origin: Secondary | ICD-10-CM | POA: Diagnosis not present

## 2018-11-18 DIAGNOSIS — Z992 Dependence on renal dialysis: Secondary | ICD-10-CM | POA: Diagnosis not present

## 2018-11-18 DIAGNOSIS — D631 Anemia in chronic kidney disease: Secondary | ICD-10-CM | POA: Diagnosis not present

## 2018-11-18 DIAGNOSIS — N186 End stage renal disease: Secondary | ICD-10-CM | POA: Diagnosis not present

## 2018-11-18 NOTE — Telephone Encounter (Signed)
Called the patient and left another voicemail to please call us back to reschedule her no show appointment on 11/14/2018.

## 2018-11-19 ENCOUNTER — Ambulatory Visit (INDEPENDENT_AMBULATORY_CARE_PROVIDER_SITE_OTHER): Payer: Medicare Other | Admitting: Nurse Practitioner

## 2018-11-19 ENCOUNTER — Encounter: Payer: Self-pay | Admitting: Nurse Practitioner

## 2018-11-19 ENCOUNTER — Other Ambulatory Visit: Payer: Self-pay

## 2018-11-19 VITALS — BP 132/70 | HR 69 | Temp 97.5°F | Ht 68.8 in | Wt 153.0 lb

## 2018-11-19 DIAGNOSIS — A040 Enteropathogenic Escherichia coli infection: Secondary | ICD-10-CM | POA: Diagnosis not present

## 2018-11-19 DIAGNOSIS — IMO0001 Reserved for inherently not codable concepts without codable children: Secondary | ICD-10-CM

## 2018-11-19 DIAGNOSIS — Z1231 Encounter for screening mammogram for malignant neoplasm of breast: Secondary | ICD-10-CM

## 2018-11-19 DIAGNOSIS — R911 Solitary pulmonary nodule: Secondary | ICD-10-CM | POA: Diagnosis not present

## 2018-11-19 DIAGNOSIS — Z1159 Encounter for screening for other viral diseases: Secondary | ICD-10-CM | POA: Diagnosis not present

## 2018-11-19 DIAGNOSIS — Z992 Dependence on renal dialysis: Secondary | ICD-10-CM

## 2018-11-19 DIAGNOSIS — I132 Hypertensive heart and chronic kidney disease with heart failure and with stage 5 chronic kidney disease, or end stage renal disease: Secondary | ICD-10-CM | POA: Diagnosis not present

## 2018-11-19 DIAGNOSIS — I120 Hypertensive chronic kidney disease with stage 5 chronic kidney disease or end stage renal disease: Secondary | ICD-10-CM | POA: Diagnosis not present

## 2018-11-19 DIAGNOSIS — K625 Hemorrhage of anus and rectum: Secondary | ICD-10-CM | POA: Diagnosis not present

## 2018-11-19 DIAGNOSIS — N186 End stage renal disease: Secondary | ICD-10-CM | POA: Diagnosis not present

## 2018-11-19 DIAGNOSIS — F17200 Nicotine dependence, unspecified, uncomplicated: Secondary | ICD-10-CM

## 2018-11-19 DIAGNOSIS — I5030 Unspecified diastolic (congestive) heart failure: Secondary | ICD-10-CM | POA: Diagnosis not present

## 2018-11-19 DIAGNOSIS — I10 Essential (primary) hypertension: Secondary | ICD-10-CM

## 2018-11-19 DIAGNOSIS — D649 Anemia, unspecified: Secondary | ICD-10-CM

## 2018-11-19 DIAGNOSIS — K921 Melena: Secondary | ICD-10-CM | POA: Diagnosis not present

## 2018-11-19 DIAGNOSIS — D500 Iron deficiency anemia secondary to blood loss (chronic): Secondary | ICD-10-CM | POA: Diagnosis not present

## 2018-11-19 NOTE — Progress Notes (Addendum)
Subjective:     Patient ID: Jessica Clarke , female    DOB: [birth year] , 68 y.o.   MRN: [id number]   Chief Complaint  Patient presents with  . Hospitalization Follow-up    she stated she has been having some SOB and she is hurting around her belly button. patient stated her SOB has been on and off    HPI  She is here today for hospital admission follow up from 9/3-9/12 after having blood in her stool, she had severe anemia requiring 4 pints of blood transfusion.  She was found to have a pulmonary nodule   Dr. Earlie Server - she is awaiting results of CT scan. They will let her know what the nodule is.    She does have physical therapy, nurse to see her at home with [facility].  She continues to get dialysis MWF doing well.    She is staying with her daughter [name].  She says she is getting winded at times trying to be independent.  She is supposed to use oxygen, she does feel out of breath when walking, going up stairs and putting clothes on.  At night as well.  She is in a hospital bed.      Past Medical History:  Diagnosis Date  . Abnormal Pap smear    ASCUS ?HGSIL  . Anemia   . Cervical dysplasia   . CHF (congestive heart failure) ([place])   . Chronic kidney disease   . Constipation   . Constipation   . GERD (gastroesophageal reflux disease)   . Gout   . Hyperlipidemia   . Hypertension   . Myocardial infarction ([place]) 2015  . Obesity   . Pneumonia   . Uterine polyp      Family History  Problem Relation Age of Onset  . Esophageal cancer Other   . Pancreatic cancer Other   . Heart disease Mother   . Hypertension Mother   . Breast cancer Sister   . Esophageal cancer Sister   . Cancer Sister   . Deep vein thrombosis Sister   . Diabetes Sister   . Hypertension Sister   . Hypertension Daughter   . Colon cancer Neg Hx      Current Outpatient Medications:  .  allopurinol (ZYLOPRIM) 100 MG tablet, Take 100 mg by mouth daily.  , Disp: , Rfl:  .  Amino Acids-Protein  Hydrolys (FEEDING SUPPLEMENT, PRO-STAT SUGAR FREE 64,) LIQD, Take 30 mLs by mouth 2 (two) times daily., Disp: 887 mL, Rfl: 0 .  aspirin EC 325 MG tablet, Take 1 tablet (325 mg total) by mouth daily., Disp: 30 tablet, Rfl: 0 .  atenolol (TENORMIN) 100 MG tablet, Take by mouth., Disp: , Rfl:  .  atorvastatin (LIPITOR) 10 MG tablet, Take 10 mg by mouth at bedtime. , Disp: , Rfl: 0 .  B Complex-Folic Acid (B COMPLEX FORMULA 1) TABS, Take by mouth., Disp: , Rfl:  .  calcitRIOL (ROCALTROL) 0.5 MCG capsule, Take 2 capsules (1 mcg total) by mouth every Monday, Wednesday, and Friday with hemodialysis., Disp: 30 capsule, Rfl: 0 .  cinacalcet (SENSIPAR) 30 MG tablet, Take 1 tablet (30 mg total) by mouth daily with supper., Disp: 60 tablet, Rfl: 0 .  colchicine 0.6 MG tablet, Take 0.6 mg by mouth daily as needed (as directed for gout flares or pain). , Disp: , Rfl:  .  Darbepoetin Alfa (ARANESP) 60 MCG/0.3ML SOSY injection, Inject 0.3 mLs (60 mcg total) into the vein every  Friday with hemodialysis., Disp: 4.2 mL, Rfl:  .  heparin 1000 UNIT/ML injection, Heparin Sodium (Porcine) 1,000 Units/mL Systemic, Disp: , Rfl:  .  hydrocerin (EUCERIN) CREA, Apply 1 application topically 2 (two) times daily., Disp: 228 g, Rfl: 0 .  loperamide (IMODIUM) 2 MG capsule, Take by mouth., Disp: , Rfl:  .  midodrine (PROAMATINE) 10 MG tablet, Take 1 tablet (10 mg total) by mouth 2 (two) times daily with a meal., Disp: 60 tablet, Rfl: 0 .  Multiple Vitamin (MULTIVITAMIN) capsule, Take by mouth., Disp: , Rfl:  .  multivitamin (RENA-VIT) TABS tablet, Take 1 tablet by mouth at bedtime., Disp: 30 tablet, Rfl: 0 .  nicotine (NICODERM CQ - DOSED IN MG/24 HOURS) 14 mg/24hr patch, Place 1 patch (14 mg total) onto the skin daily as needed (smoking craving)., Disp: 28 patch, Rfl: 0 .  thiamine (VITAMIN B-1) 100 MG tablet, Take 100 mg by mouth daily., Disp: , Rfl:  .  VELPHORO 500 MG chewable tablet, Chew 2 tablets by mouth 3 (three) times  daily with meals. , Disp: , Rfl:  .  VITAMIN D, CHOLECALCIFEROL, PO, Take by mouth., Disp: , Rfl:  .  acetaminophen (TYLENOL) 325 MG tablet, Take 325-650 mg by mouth every 8 (eight) hours as needed for mild pain. , Disp: , Rfl:    No Known Allergies   Review of Systems  Constitutional: Negative.   Respiratory: Positive for shortness of breath (with exertion).   Cardiovascular: Negative.  Negative for chest pain, palpitations and leg swelling.  Musculoskeletal: Negative.   Neurological: Negative.   Psychiatric/Behavioral: Negative.     Today's Vitals   11/19/18 1549  BP: 132/70  Pulse: 69  Temp: (!) 97.5 F (36.4 C)  TempSrc: Oral  Weight: 153 lb (69.4 kg)  Height: 5' 8.8" (1.748 m)  PainSc: 2   PainLoc: Abdomen   Body mass index is 22.73 kg/m.   Objective:  Physical Exam Vitals signs reviewed.  Constitutional:      Appearance: Normal appearance.  Cardiovascular:     Rate and Rhythm: Normal rate and regular rhythm.     Pulses: Normal pulses.     Heart sounds: Normal heart sounds. No murmur.  Pulmonary:     Effort: Respiratory distress (with exertion takes her time to recover) present.     Breath sounds: Normal breath sounds. No wheezing.  Chest:     Chest wall: No tenderness.  Skin:    Capillary Refill: Capillary refill takes less than 2 seconds.     Comments: Hands are slightly cool  Neurological:     General: No focal deficit present.     Mental Status: She is alert and oriented to person, place, and time.  Psychiatric:        Mood and Affect: Mood normal.        Behavior: Behavior normal.        Thought Content: Thought content normal.        Judgment: Judgment normal.         Assessment And Plan:     1. Lung nodule < 6cm on CT  She was found to have a lung nodule while hospitalized and is to follow up with Dr. Earlie Server  I will also refer her to palliative care for symptom management and goals of care - Amb Referral to Palliative Care  2. Rectal  bleeding  Had rectal bleeding in the hospital and I to follow up with GI  She denies having bloody stool  at the time TCM Performed. A member of the clinical team spoke with the patient upon dischare. Discharge summary was reviewed in full detail during the visit. Meds reconciled and compared to discharge meds. Medication list is updated and reviewed with the patient.  Greater than 50% face to face time was spent in counseling an coordination of care.  All questions were answered to the satisfaction of the patient.   - Ambulatory referral to Gastroenterology  3. ESRD on dialysis [facility])  Continues to go for dialysis  4. Smoker  She is currently using nicotine patches and ha not smoked since being in the hospital  Oxygen dropped to 87% after walking and would benefit from home oxygen therapy.  This also may be related to a low hemoglobin  5. Essential hypertension . B/P is controlled.  . CMP ordered to check renal function.  . The importance of regular exercise and dietary modification was stressed to the patient.  - Referral to Chronic Care Management Services - BMP8+eGFR  6. Anemia, unspecified type  She was given 4 units of PRBC while at the hospital after having bloody stools  Will recheck Hgb  - CBC no Diff  7. Encounter for hepatitis C screening test for low risk patient  Will check for Hepatitis C screening due to being born between the years 50-1967 - Hepatitis C antibody  8. Encounter for screening mammogram for breast cancer  Pt instructed on Self Breast Exam.According to ACOG guidelines Women aged 50 and older are recommended to get an annual mammogram. Form completed and given to patient contact the [facility] for appointment scheduing.   Pt encouraged to get annual mammogram - MM Digital Screening; Future  Spent more than 50% of face to face time counseling patient and daughter with a total time of 50 minutes.   [name], FNP    THE PATIENT IS  ENCOURAGED TO PRACTICE SOCIAL DISTANCING DUE TO THE COVID-19 PANDEMIC.

## 2018-11-20 ENCOUNTER — Ambulatory Visit: Payer: Self-pay

## 2018-11-20 ENCOUNTER — Encounter: Payer: Self-pay | Admitting: *Deleted

## 2018-11-20 ENCOUNTER — Telehealth: Payer: Self-pay | Admitting: *Deleted

## 2018-11-20 DIAGNOSIS — I10 Essential (primary) hypertension: Secondary | ICD-10-CM

## 2018-11-20 DIAGNOSIS — D631 Anemia in chronic kidney disease: Secondary | ICD-10-CM | POA: Diagnosis not present

## 2018-11-20 DIAGNOSIS — Z992 Dependence on renal dialysis: Secondary | ICD-10-CM

## 2018-11-20 DIAGNOSIS — R918 Other nonspecific abnormal finding of lung field: Secondary | ICD-10-CM

## 2018-11-20 DIAGNOSIS — N186 End stage renal disease: Secondary | ICD-10-CM

## 2018-11-20 DIAGNOSIS — N2581 Secondary hyperparathyroidism of renal origin: Secondary | ICD-10-CM | POA: Diagnosis not present

## 2018-11-20 LAB — CBC
Hematocrit: 23 % — ABNORMAL LOW (ref 34.0–46.6)
Hemoglobin: 7.5 g/dL — ABNORMAL LOW (ref 11.1–15.9)
MCH: 32.6 pg (ref 26.6–33.0)
MCHC: 32.6 g/dL (ref 31.5–35.7)
MCV: 100 fL — ABNORMAL HIGH (ref 79–97)
Platelets: 216 10*3/uL (ref 150–450)
RBC: 2.3 x10E6/uL — CL (ref 3.77–5.28)
RDW: 17 % — ABNORMAL HIGH (ref 11.7–15.4)
WBC: 5.7 10*3/uL (ref 3.4–10.8)

## 2018-11-20 LAB — BMP8+EGFR
BUN/Creatinine Ratio: 5 — ABNORMAL LOW (ref 12–28)
BUN: 19 mg/dL (ref 8–27)
CO2: 26 mmol/L (ref 20–29)
Calcium: 9 mg/dL (ref 8.7–10.3)
Chloride: 96 mmol/L (ref 96–106)
Creatinine, Ser: 3.73 mg/dL — ABNORMAL HIGH (ref 0.57–1.00)
GFR calc Af Amer: 14 mL/min/{1.73_m2} — ABNORMAL LOW (ref >59)
GFR calc non Af Amer: 12 mL/min/{1.73_m2} — ABNORMAL LOW (ref >59)
Glucose: 78 mg/dL (ref 65–99)
Potassium: 3.9 mmol/L (ref 3.5–5.2)
Sodium: 136 mmol/L (ref 134–144)

## 2018-11-20 LAB — HEPATITIS C ANTIBODY: Hep C Virus Ab: <0.1 s/co ratio (ref 0.0–0.9)

## 2018-11-20 NOTE — Patient Instructions (Signed)
Social Worker Visit Information  Goals we discussed today:  Goals Addressed            This Visit's Progress   . Assist with chronic care management enrollment and conduction of SDOH screen       Current Barriers:  Marland Kitchen Knowledge Barriers related to resources and support available to address needs related to Chronic Care Management and challenges surrounding Social Determinants of Health  Clinical Social Work Clinical Goal(s):   Over the next 14 days the patienand her daughter will follow up with the [facility] as directed by SW.  Over the next 20 days, the patient will understand the role of the CCM team and work with SW to complete SDOH (Social Determinants of Health) screen.  CCM SW Interventions: Completed 11/20/2018 with daughter [name] call placed to the patient's daughter [name] as indicated on patient referral to introduce the Chronic Care Management program  Education provided regarding referral placed by [name] FNP and obtained verbal consent for enrollment  Assessed for patient current chronic condition whichs include HTN and ESRD  Determined the patient is currently on dialysis 3 days weekly (M,W,F schedule)  The patient is also currently receiving PT in the home on Tuesday and Thursdays.   Performed chart review to note the patient is enrolled in Palliative care offerred through [place] during chart review the patient missed a recent appointment at the [facility] and has yet to return call for rescheduling purposes  Provided [name] with direct contact number to the cancer center. Encouraged [name] to contact the [facility] at the end of today's call to arrange a new appointment for the patient following a recent CT scan  Collaboration with RN Case Manager regarding patient enrollment and above interventions  Patient Self Care Activities:   Patient currently unable to independently manage chronic conditions   Initial goal  documentation:         Materials provided: Verbal education about chronic case management provided by phone  Ms. Dace was given information about Chronic Care Management services today including:  1. CCM service includes personalized support from designated clinical staff supervised by her physician, including individualized plan of care and coordination with other care providers 2. 24/7 contact phone numbers for assistance for urgent and routine care needs. 3. Service will only be billed when office clinical staff spend 20 minutes or more in a month to coordinate care. 4. Only one practitioner may furnish and bill the service in a calendar month. 5. The patient may stop CCM services at any time (effective at the end of the month) by phone call to the office staff. 6. The patient will be responsible for cost sharing (co-pay) of up to 20% of the service fee (after annual deductible is met).  Patient agreed to services and verbal consent obtained.   The patient verbalized understanding of instructions provided today and declined a print copy of patient instruction materials.   Follow up plan: SW will follow up with the patient over the next week to assist with care coordination.   Daneen Schick, BSW, CDP Social Worker, Certified Dementia Practitioner [place] [facility] [place] [facility] [phone]

## 2018-11-20 NOTE — Telephone Encounter (Signed)
.   Oncology Nurse Navigator Documentation  Oncology Nurse Navigator Flowsheets 11/20/2018  Navigator Location [facility]  Navigator Encounter Type Telephone/I called patient to follow up with her and set her up for a follow up appt.  I was unable to reach but did leave a vm message for her to call with my name and phone number.   Telephone Outgoing Call  Treatment Phase Abnormal Scans  Barriers/Navigation Needs Coordination of Care;Education  Education Other  Interventions Coordination of Care;Education  Coordination of Care Other  Education Method Verbal  Acuity Level 2-Minimal Needs (1-2 Barriers Identified)  Time Spent with Patient 15

## 2018-11-20 NOTE — Progress Notes (Signed)
I received a call back from [name] daughter.  I gave her an appt to be seen tomorrow. She verbalized understanding of appt time and place.

## 2018-11-20 NOTE — Chronic Care Management (AMB) (Signed)
Chronic Care Management   Social Work General Note  11/20/2018 Name: Micki Cassel MRN: [id number] DOB: [birth year]  Jessica Clarke is a 68 y.o. year old female who is a primary care patient of [name], [place]. The CCM was consulted to assist the patient with chronic care management and care coordination.   I placed an outbound call to the patients daughter and primary caregiver Yariela Tison as indicated by the patients provider within the referral notes.   Ms. Krack was given information about Chronic Care Management services today including:  1. CCM service includes personalized support from designated clinical staff supervised by her physician, including individualized plan of care and coordination with other care providers 2. 24/7 contact phone numbers for assistance for urgent and routine care needs. 3. Service will only be billed when office clinical staff spend 20 minutes or more in a month to coordinate care. 4. Only one practitioner may furnish and bill the service in a calendar month. 5. The patient may stop CCM services at any time (effective at the end of the month) by phone call to the office staff. 6. The patient will be responsible for cost sharing (co-pay) of up to 20% of the service fee (after annual deductible is met).  Patient agreed to services and verbal consent obtained.   Review of patient status, including review of consultants reports, relevant laboratory and other test results, and collaboration with appropriate care team members and the patient's provider was performed as part of comprehensive patient evaluation and provision of chronic care management services.    During today's call it is noted the patient is currently receiving [facility] as well as Palliative services within the home. The patient receives dialysis three days weekly. Upon chart review it is noted the patient had a recent CT scan in regard to a lung mass. The patient was a no show to her  follow up appointment at the cancer center and has not returned outreach calls to reschedule. SW discussed this information with [name] whom reports lack of knowledge the patient had an appointment arranged. See care plan below regarding specific interventions during today's call.  Outpatient Encounter Medications as of 11/20/2018  Medication Sig Note  . acetaminophen (TYLENOL) 325 MG tablet Take 325-650 mg by mouth every 8 (eight) hours as needed for mild pain.    Marland Kitchen allopurinol (ZYLOPRIM) 100 MG tablet Take 100 mg by mouth daily.     . Amino Acids-Protein Hydrolys (FEEDING SUPPLEMENT, PRO-STAT SUGAR FREE 64,) LIQD Take 30 mLs by mouth 2 (two) times daily.   Marland Kitchen aspirin EC 325 MG tablet Take 1 tablet (325 mg total) by mouth daily. 10/17/2018: Regimen confirmed to be accurate by the patient  . atenolol (TENORMIN) 100 MG tablet Take by mouth.   Marland Kitchen atorvastatin (LIPITOR) 10 MG tablet Take 10 mg by mouth at bedtime.    . B Complex-Folic Acid (B COMPLEX FORMULA 1) TABS Take by mouth.   . calcitRIOL (ROCALTROL) 0.5 MCG capsule Take 2 capsules (1 mcg total) by mouth every Monday, Wednesday, and Friday with hemodialysis.   Marland Kitchen cinacalcet (SENSIPAR) 30 MG tablet Take 1 tablet (30 mg total) by mouth daily with supper.   . colchicine 0.6 MG tablet Take 0.6 mg by mouth daily as needed (as directed for gout flares or pain).    . Darbepoetin Alfa (ARANESP) 60 MCG/0.3ML SOSY injection Inject 0.3 mLs (60 mcg total) into the vein every Friday with hemodialysis.   . heparin 1000 UNIT/ML injection  Heparin Sodium (Porcine) 1,000 Units/mL Systemic   . hydrocerin (EUCERIN) CREA Apply 1 application topically 2 (two) times daily.   Marland Kitchen loperamide (IMODIUM) 2 MG capsule Take by mouth.   . midodrine (PROAMATINE) 10 MG tablet Take 1 tablet (10 mg total) by mouth 2 (two) times daily with a meal.   . Multiple Vitamin (MULTIVITAMIN) capsule Take by mouth.   . multivitamin (RENA-VIT) TABS tablet Take 1 tablet by mouth at bedtime.   .  nicotine (NICODERM CQ - DOSED IN MG/24 HOURS) 14 mg/24hr patch Place 1 patch (14 mg total) onto the skin daily as needed (smoking craving).   . thiamine (VITAMIN B-1) 100 MG tablet Take 100 mg by mouth daily.   . VELPHORO 500 MG chewable tablet Chew 2 tablets by mouth 3 (three) times daily with meals.    Marland Kitchen VITAMIN D, CHOLECALCIFEROL, PO Take by mouth.    No facility-administered encounter medications on file as of 11/20/2018.     Goals Addressed            This Visit's Progress   . Assist with chronic care management enrollment and conduction of SDOH screen       Current Barriers:  Marland Kitchen Knowledge Barriers related to resources and support available to address needs related to Chronic Care Management and challenges surrounding Social Determinants of Health  Clinical Social Work Clinical Goal(s):   Over the next 14 days the patienand her daughter will follow up with the [facility] as directed by SW.  Over the next 20 days, the patient will understand the role of the CCM team and work with SW to complete SDOH (Social Determinants of Health) screen.  CCM SW Interventions: Completed 11/20/2018 with daughter [name] call placed to the patient's daughter [name] as indicated on patient referral to introduce the Chronic Care Management program  Education provided regarding referral placed by [name] FNP and obtained verbal consent for enrollment  Assessed for patient current chronic condition whichs include HTN and ESRD  Determined the patient is currently on dialysis 3 days weekly (M,W,F schedule)  The patient is also currently receiving PT in the home on Tuesday and Thursdays.   Performed chart review to note the patient is enrolled in Palliative care offerred through [facility] during chart review the patient missed a recent appointment at the [facility] and has yet to return call for rescheduling purposes  Provided [name] with direct contact number to the  cancer center. Encouraged [name] to contact the [facility] at the end of today's call to arrange a new appointment for the patient following a recent CT scan  Collaboration with RN Case Manager regarding patient enrollment and above interventions  Patient Self Care Activities:   Patient currently unable to independently manage chronic conditions   Initial goal documentation:         Follow Up Plan: SW will follow up with [name] over the next week to conduct SDOH screen and assist with care coordination.      Daneen Schick, BSW, CDP Social Worker, Certified Dementia Practitioner [place] [facility] [place] [facility] [phone]  Total time spent performing care coordination and/or care management activities with the patient by phone or face to face = 16 minutes.

## 2018-11-20 NOTE — Chronic Care Management (AMB) (Signed)
Chronic Care Management   Initial Visit Note  11/20/2018 Name: Jessica Clarke MRN: [id number] DOB: 07-Jan-1951  Referred by: [name], FNP Reason for referral : Chronic Care Management (CCM RNCM Case Collaboration )   Jessica Clarke is a 68 y.o. year old female who is a primary care patient of [name], [place]. The care management team was consulted for assistance with chronic disease management and care coordination needs.   Review of patient status, including review of consultants reports, relevant laboratory and other test results, and collaboration with appropriate care team members and the patient's provider was performed as part of comprehensive patient evaluation and provision of chronic care management services.    I initiated and established the plan of care for Jessica Clarke during one on one collaboration with my clinical care management colleague Daneen Schick BSW who is also engaged with this patient to address social work needs.   Outpatient Encounter Medications as of 11/20/2018  Medication Sig Note  . acetaminophen (TYLENOL) 325 MG tablet Take 325-650 mg by mouth every 8 (eight) hours as needed for mild pain.    Marland Kitchen allopurinol (ZYLOPRIM) 100 MG tablet Take 100 mg by mouth daily.     . Amino Acids-Protein Hydrolys (FEEDING SUPPLEMENT, PRO-STAT SUGAR FREE 64,) LIQD Take 30 mLs by mouth 2 (two) times daily.   Marland Kitchen aspirin EC 325 MG tablet Take 1 tablet (325 mg total) by mouth daily. 10/17/2018: Regimen confirmed to be accurate by the patient  . atenolol (TENORMIN) 100 MG tablet Take by mouth.   Marland Kitchen atorvastatin (LIPITOR) 10 MG tablet Take 10 mg by mouth at bedtime.    . B Complex-Folic Acid (B COMPLEX FORMULA 1) TABS Take by mouth.   . calcitRIOL (ROCALTROL) 0.5 MCG capsule Take 2 capsules (1 mcg total) by mouth every Monday, Wednesday, and Friday with hemodialysis.   Marland Kitchen cinacalcet (SENSIPAR) 30 MG tablet Take 1 tablet (30 mg total) by mouth daily with supper.   . colchicine 0.6 MG  tablet Take 0.6 mg by mouth daily as needed (as directed for gout flares or pain).    . Darbepoetin Alfa (ARANESP) 60 MCG/0.3ML SOSY injection Inject 0.3 mLs (60 mcg total) into the vein every Friday with hemodialysis.   . heparin 1000 UNIT/ML injection Heparin Sodium (Porcine) 1,000 Units/mL Systemic   . hydrocerin (EUCERIN) CREA Apply 1 application topically 2 (two) times daily.   Marland Kitchen loperamide (IMODIUM) 2 MG capsule Take by mouth.   . midodrine (PROAMATINE) 10 MG tablet Take 1 tablet (10 mg total) by mouth 2 (two) times daily with a meal.   . Multiple Vitamin (MULTIVITAMIN) capsule Take by mouth.   . multivitamin (RENA-VIT) TABS tablet Take 1 tablet by mouth at bedtime.   . nicotine (NICODERM CQ - DOSED IN MG/24 HOURS) 14 mg/24hr patch Place 1 patch (14 mg total) onto the skin daily as needed (smoking craving).   . thiamine (VITAMIN B-1) 100 MG tablet Take 100 mg by mouth daily.   . VELPHORO 500 MG chewable tablet Chew 2 tablets by mouth 3 (three) times daily with meals.    Marland Kitchen VITAMIN D, CHOLECALCIFEROL, PO Take by mouth.    No facility-administered encounter medications on file as of 11/20/2018.      Goals Addressed    . Assist with Chronic Care Management and Care Coordination needs       Current Barriers:  Marland Kitchen Knowledge Barriers related to resources and support available to address needs related to Chronic disease management  Case  Manager Clinical Goal(s):  Marland Kitchen Over the next 30 days, patient will work with the CCM team to address needs related to Chronic disease management and Care Coordination  Interventions:  . Collaborated with BSW and initiated plan of care to address needs related to Chronic diease management and Care Coordination  Patient Self Care Activities:  . Attends all scheduled provider appointments . Calls pharmacy for medication refills . Calls provider office for new concerns or questions  Initial goal documentation         Telephone follow up appointment with  care management team member scheduled for: 11/27/18  Barb Merino, RN, BSN, CCM Care Management Coordinator [place] [facility]  Direct Phone: [phone]

## 2018-11-21 ENCOUNTER — Other Ambulatory Visit: Payer: Self-pay

## 2018-11-21 ENCOUNTER — Telehealth: Payer: Self-pay | Admitting: Physician Assistant

## 2018-11-21 ENCOUNTER — Inpatient Hospital Stay (HOSPITAL_BASED_OUTPATIENT_CLINIC_OR_DEPARTMENT_OTHER): Payer: Medicare Other | Admitting: Physician Assistant

## 2018-11-21 ENCOUNTER — Encounter: Payer: Self-pay | Admitting: Physician Assistant

## 2018-11-21 VITALS — BP 143/76 | HR 100 | Temp 98.9°F | Resp 18 | Ht 68.8 in | Wt 154.3 lb

## 2018-11-21 DIAGNOSIS — R918 Other nonspecific abnormal finding of lung field: Secondary | ICD-10-CM

## 2018-11-21 DIAGNOSIS — Z992 Dependence on renal dialysis: Secondary | ICD-10-CM | POA: Diagnosis not present

## 2018-11-21 DIAGNOSIS — D631 Anemia in chronic kidney disease: Secondary | ICD-10-CM | POA: Diagnosis not present

## 2018-11-21 DIAGNOSIS — R197 Diarrhea, unspecified: Secondary | ICD-10-CM | POA: Diagnosis not present

## 2018-11-21 DIAGNOSIS — N186 End stage renal disease: Secondary | ICD-10-CM | POA: Diagnosis not present

## 2018-11-21 DIAGNOSIS — I132 Hypertensive heart and chronic kidney disease with heart failure and with stage 5 chronic kidney disease, or end stage renal disease: Secondary | ICD-10-CM | POA: Diagnosis not present

## 2018-11-21 DIAGNOSIS — R590 Localized enlarged lymph nodes: Secondary | ICD-10-CM | POA: Diagnosis not present

## 2018-11-21 NOTE — Telephone Encounter (Signed)
Scheduled appt per 10/8 los - unable to reach pt - left message with appt date and time . Referral placed and sent through [facility]

## 2018-11-21 NOTE — Progress Notes (Signed)
[facility] OFFICE PROGRESS NOTE  [name], [address] [place] [address]  DIAGNOSIS: Suspicious lung cancer with 3.1 cm right upper lobe lung mass with right paratracheal lymphadenopathy and mildly enlarged right retropectoral, left supraclavicular, and left mediastinal lymphadenopathy. First noted in January 2020 but lost to follow up until September 2020. Pending tissue diagnosis.   PRIOR THERAPY: None  CURRENT THERAPY: None  INTERVAL HISTORY: Jessica Clarke 68 y.o. female returns to the clinic today for a follow-up visit. The patient was lost to follow-up since January 2020.At that time, she had a PET scan that was suspicious for right upper lobe primary lung malignancy. The patient was supposed to have pulmonary function test and be referred to cardiothoracic surgery after her last visit in January, but she was lost to follow-up since that time. She recently returned to the clinic and is in the process of re-staging her disease.   Today, the patient is still experiencing fatigue. The patient has ESRD and is on dialysis. The patient also endorses "stomach" issues. The patient recently saw her PCP for this complaint and a referral was placed to gastroenterology. She recently was in the hospital in September 2020 for rectal bleeding, severe anemia, and diverticulitis. She states that her stomach near the navel feels "hard" and sometimes feels swollen. She denies any nausea, vomiting, or constipation. She has frequent diarrhea which she takes imodium. She denies any fever, chills, or night sweats. She reports shortness of breath but denies any cough, chest pain, or hemoptysis. She reports blurry vision but denies headache. She recently had a brain MRI performed on 10/24/2018 which was negative for any metastatic disease to the brain. The patient is here today to review her CT scan and to discuss the further staging workup and tissue confirmation.   MEDICAL  HISTORY: Past Medical History:  Diagnosis Date  . Abnormal Pap smear    ASCUS ?HGSIL  . Anemia   . Cervical dysplasia   . CHF (congestive heart failure) ([place])   . Chronic kidney disease   . Constipation   . Constipation   . GERD (gastroesophageal reflux disease)   . Gout   . Hyperlipidemia   . Hypertension   . Myocardial infarction ([place]) 2015  . Obesity   . Pneumonia   . Uterine polyp     ALLERGIES:  has No Known Allergies.  MEDICATIONS:  Current Outpatient Medications  Medication Sig Dispense Refill  . acetaminophen (TYLENOL) 325 MG tablet Take 325-650 mg by mouth every 8 (eight) hours as needed for mild pain.     Marland Kitchen allopurinol (ZYLOPRIM) 100 MG tablet Take 100 mg by mouth daily.      . Amino Acids-Protein Hydrolys (FEEDING SUPPLEMENT, PRO-STAT SUGAR FREE 64,) LIQD Take 30 mLs by mouth 2 (two) times daily. 887 mL 0  . aspirin EC 325 MG tablet Take 1 tablet (325 mg total) by mouth daily. 30 tablet 0  . atenolol (TENORMIN) 100 MG tablet Take by mouth.    Marland Kitchen atorvastatin (LIPITOR) 10 MG tablet Take 10 mg by mouth at bedtime.   0  . B Complex-Folic Acid (B COMPLEX FORMULA 1) TABS Take by mouth.    . calcitRIOL (ROCALTROL) 0.5 MCG capsule Take 2 capsules (1 mcg total) by mouth every Monday, Wednesday, and Friday with hemodialysis. 30 capsule 0  . cinacalcet (SENSIPAR) 30 MG tablet Take 1 tablet (30 mg total) by mouth daily with supper. 60 tablet 0  . colchicine 0.6 MG  tablet Take 0.6 mg by mouth daily as needed (as directed for gout flares or pain).     . Darbepoetin Alfa (ARANESP) 60 MCG/0.3ML SOSY injection Inject 0.3 mLs (60 mcg total) into the vein every Friday with hemodialysis. 4.2 mL   . heparin 1000 UNIT/ML injection Heparin Sodium (Porcine) 1,000 Units/mL Systemic    . hydrocerin (EUCERIN) CREA Apply 1 application topically 2 (two) times daily. 228 g 0  . loperamide (IMODIUM) 2 MG capsule Take by mouth.    . midodrine (PROAMATINE) 10 MG tablet Take 1 tablet (10 mg total)  by mouth 2 (two) times daily with a meal. 60 tablet 0  . Multiple Vitamin (MULTIVITAMIN) capsule Take by mouth.    . multivitamin (RENA-VIT) TABS tablet Take 1 tablet by mouth at bedtime. 30 tablet 0  . nicotine (NICODERM CQ - DOSED IN MG/24 HOURS) 14 mg/24hr patch Place 1 patch (14 mg total) onto the skin daily as needed (smoking craving). 28 patch 0  . thiamine (VITAMIN B-1) 100 MG tablet Take 100 mg by mouth daily.    . VELPHORO 500 MG chewable tablet Chew 2 tablets by mouth 3 (three) times daily with meals.     Marland Kitchen VITAMIN D, CHOLECALCIFEROL, PO Take by mouth.     No current facility-administered medications for this visit.     SURGICAL HISTORY:  Past Surgical History:  Procedure Laterality Date  . AV FISTULA PLACEMENT  01/18/2012   Procedure: ARTERIOVENOUS (AV) FISTULA CREATION;  Surgeon: Angelia Mould, MD;  Location: [facility];  Service: Vascular;  Laterality: Left;  . BIOPSY  10/20/2018   Procedure: BIOPSY;  Surgeon: Thornton Park, MD;  Location: [facility];  Service: Gastroenterology;;  . COLONOSCOPY WITH PROPOFOL N/A 10/20/2018   Procedure: COLONOSCOPY WITH PROPOFOL;  Surgeon: Thornton Park, MD;  Location: [facility];  Service: Gastroenterology;  Laterality: N/A;  . DILATION AND CURETTAGE OF UTERUS    . HYSTEROSCOPY    . LEEP    . LEFT HEART CATHETERIZATION WITH CORONARY ANGIOGRAM N/A 08/26/2013   Procedure: LEFT HEART CATHETERIZATION WITH CORONARY ANGIOGRAM;  Surgeon: Leonie Man, MD;  Location: [facility] CATH LAB;  Service: Cardiovascular;  Laterality: N/A;  . POLYPECTOMY  10/20/2018   Procedure: POLYPECTOMY;  Surgeon: Thornton Park, MD;  Location: [facility];  Service: Gastroenterology;;  . REVISON OF ARTERIOVENOUS FISTULA Left 5/40/9811   Procedure: PLICATION OF ARTERIOVENOUS FISTULA;  Surgeon: Conrad Poole, MD;  Location: [facility];  Service: Vascular;  Laterality: Left;   REVIEW OF SYSTEMS:   Review of Systems  Constitutional: Positive for fatigue and weight loss.  Negative for appetite change, chills, and fever HENT: Negative for mouth sores, nosebleeds, sore throat and trouble swallowing.   Eyes: Positive for blurry vision. Negative for icterus.  Respiratory: Positive for occasional shortness of breath. Negative for cough, hemoptysis, and wheezing.   Cardiovascular: Negative for chest pain and leg swelling.  Gastrointestinal: Positive for frequent diarrhea. Negative for abdominal pain, constipation, nausea and vomiting.  Genitourinary: Negative for bladder incontinence, difficulty urinating, dysuria, frequency and hematuria.   Musculoskeletal: Negative for back pain, gait problem, neck pain and neck stiffness.  Skin: Negative for itching and rash.  Neurological: Negative for dizziness, extremity weakness, gait problem, headaches, light-headedness and seizures.  Hematological: Negative for adenopathy. Does not bruise/bleed easily.  Psychiatric/Behavioral: Negative for confusion, depression and sleep disturbance. The patient is not nervous/anxious.     PHYSICAL EXAMINATION:  Blood pressure (!) 143/76, pulse 100, temperature 98.9 F (37.2 C), temperature source  Temporal, resp. rate 18, height 5' 8.8" (1.748 m), weight 154 lb 4.8 oz (70 kg), SpO2 100 %.  ECOG PERFORMANCE STATUS: 1 - Symptomatic but completely ambulatory  Physical Exam  Constitutional: Oriented to person, place, and time and chronically ill appearing female and in no distress.  HENT:  Head: Normocephalic and atraumatic.  Mouth/Throat: Oropharynx is clear and moist. No oropharyngeal exudate.  Eyes: Conjunctivae are normal. Right eye exhibits no discharge. Left eye exhibits no discharge. No scleral icterus. Arcus senilis noted bilaterally.  Neck: Normal range of motion. Neck supple.  Cardiovascular: Murmur noted. Normal rate, regular rhythm, sounds and intact distal pulses.   Pulmonary/Chest: Crackles noted bilaterally at the base of the lungs. Effort normal. No respiratory distress. No  wheezes. No rales.  Abdominal: Soft. Bowel sounds are normal. Exhibits no distension and no mass. There is no tenderness.  Musculoskeletal: Normal range of motion. Exhibits no edema.  Lymphadenopathy:    No cervical adenopathy.  Neurological: Alert and oriented to person, place, and time. Exhibits normal muscle tone. Gait normal. Coordination normal.  Skin: Skin is warm and dry. No rash noted. Not diaphoretic. No erythema. No pallor.  Psychiatric: Mood, memory and judgment normal.  Vitals reviewed.  LABORATORY DATA: Lab Results  Component Value Date   WBC 5.7 11/19/2018   HGB 7.5 (L) 11/19/2018   HCT 23.0 (L) 11/19/2018   MCV 100 (H) 11/19/2018   PLT 216 11/19/2018      Chemistry      Component Value Date/Time   NA 136 11/19/2018 1640   K 3.9 11/19/2018 1640   CL 96 11/19/2018 1640   CO2 26 11/19/2018 1640   BUN 19 11/19/2018 1640   CREATININE 3.73 (H) 11/19/2018 1640   CREATININE 4.50 (HH) 10/28/2018 1347      Component Value Date/Time   CALCIUM 9.0 11/19/2018 1640   CALCIUM 9.0 10/22/2009 2253   ALKPHOS 205 (H) 10/28/2018 1347   AST 22 10/28/2018 1347   ALT 9 10/28/2018 1347   BILITOT 0.5 10/28/2018 1347       RADIOGRAPHIC STUDIES:  Ct Chest Wo Contrast  Result Date: 11/12/2018 CLINICAL DATA:  Hypermetabolic right upper lobe pulmonary nodule diagnosed November 2019 suspicious for primary bronchogenic carcinoma, lost to follow-up, presenting for restaging. EXAM: CT CHEST WITHOUT CONTRAST TECHNIQUE: Multidetector CT imaging of the chest was performed following the standard protocol without IV contrast. COMPARISON:  12/27/2017 chest CT.  02/26/2018 PET-CT. FINDINGS: Cardiovascular: Mild cardiomegaly. No significant pericardial effusion/thickening. Three-vessel coronary atherosclerosis. Atherosclerotic thoracic aorta with stable ectatic 4.1 cm ascending thoracic aorta. Stable dilated main pulmonary artery (3.5 cm diameter). Mediastinum/Nodes: No discrete thyroid nodules.  Unremarkable esophagus. No pathologically enlarged axillary nodes. Mildly enlarged 1.0 cm right retropectoral node (series 2/image 40), stable since 12/25/2017 chest CT. Stable mildly enlarged 1.0 cm left supraclavicular node (series 2/image 18). Multiple enlarged right paratracheal nodes up to 2.5 cm (series 2/image 45), increased from 1.5 cm. Stable mildly enlarged 1.0 cm AP window node (series 2/image 5). Hilar nodes are poorly evaluated on this noncontrast scan. Lungs/Pleura: No pneumothorax. Small dependent right pleural effusion, increased from prior chest CT. No left pleural effusion. Moderate centrilobular and paraseptal emphysema. Spiculated 3.1 x 2.5 cm right upper lobe lung mass (series 7/image 36), increased from 2.1 x 1.6 cm, with new central cavitation. No acute consolidative airspace disease or new significant pulmonary nodules. Stable subcentimeter left upper lobe calcified granuloma. Mild patchy reticulation and ground-glass attenuation throughout the peripheral lungs bilaterally without significant regions traction  bronchiectasis, architectural distortion or frank honeycombing. Upper abdomen: Small volume perihepatic/perisplenic ascites. Atrophic kidneys bilaterally. Musculoskeletal: No aggressive appearing focal osseous lesions. New anasarca. Faint sclerosis throughout the thoracic skeleton is compatible with renal osteodystrophy. Mild thoracic spondylosis. IMPRESSION: 1. Interval growth of spiculated cavitary 3.1 cm right upper lobe lung mass, compatible with primary bronchogenic carcinoma. 2. Interval progression of right paratracheal lymphadenopathy, suspicious for metastatic disease. 3. Additional mildly enlarged right retropectoral, left supraclavicular and left mediastinal lymph nodes are unchanged and nonspecific, potentially reactive. 4. Small dependent right pleural effusion. 5. Small volume upper abdominal ascites. 6. Anasarca. 7. Cardiomegaly. Three-vessel coronary atherosclerosis.  Dilated main pulmonary artery suggests pulmonary hypertension. 8. Renal osteodystrophy. Aortic Atherosclerosis (ICD10-I70.0) and Emphysema (ICD10-J43.9). Electronically Signed   By: Ilona Sorrel M.D.   On: 11/12/2018 15:11   Mr Brain Wo Contrast  Result Date: 10/24/2018 CLINICAL DATA:  Encephalopathy. Altered level of consciousness. History of lung cancer. EXAM: MRI HEAD WITHOUT CONTRAST TECHNIQUE: Multiplanar, multiecho pulse sequences of the brain and surrounding structures were obtained without intravenous contrast. COMPARISON:  CT head 10/19/2018 FINDINGS: Brain: Mild atrophy. Negative for hydrocephalus. Negative for acute infarct. Several tiny white matter hyper intensities bilaterally. Negative for hemorrhage or mass. No evidence of metastatic disease on unenhanced images. Vascular: Normal arterial flow voids. Skull and upper cervical spine: Negative Sinuses/Orbits: Mild mucosal edema paranasal sinuses.  Normal orbit Other: None IMPRESSION: No acute abnormality.  No acute infarct or metastatic disease Atrophy and minimal chronic white matter change. Electronically Signed   By: Franchot Gallo M.D.   On: 10/24/2018 16:42     ASSESSMENT/PLAN:  This is a very pleasant 68 year old African-American female with a right upper lobe hypermetabolic nodule which is highly suspicious for lung cancer. The patient was initially seen in January 2020 but was lost to follow up until September 2020. Pending further staging workup.   The patient is currently undergoing the staging work-up at this time.  The patient recently had a restaging CT scan of the chest performed.  Dr. Julien Nordmann personally and independently reviewed the scan and discussed the results with the patient today, as well as her daughter, who was available by phone.  The scan showed an interval growth of the spiculated cavitary right upper lobe mass.  The scan also noted interval progression of the right paratracheal lymphadenopathy which is suspicious  for metastatic disease.  The scan also noted additional mildly enlarged right retropectoral, left supraclavicular and left mediastinal lymph nodes which are unchanged from her prior scan from 12/25/2017.  Dr. Julien Nordmann recommends that we refer the patient to pulmonology for consideration of navigational bronchoscopy and endobronchial ultrasound for tissue diagnosis of her condition.   I have placed the referral.  We will see the patient back in approximately 2 weeks for evaluation and to review the final pathology of the right upper lobe mass.  The patient will follow-up with gastroenterology regarding her abdominal discomfort from her recent hospitalization.  The patient was advised to call immediately if she has any concerning symptoms in the interval. The patient voices understanding of current disease status and treatment options and is in agreement with the current care plan. All questions were answered. The patient knows to call the clinic with any problems, questions or concerns. We can certainly see the patient much sooner if necessary  Orders Placed This Encounter  Procedures  . Ambulatory referral to Pulmonology    Referral Priority:   Urgent    Referral Type:   Consultation  Referral Reason:   Specialty Services Required    Requested Specialty:   Pulmonary Disease    Number of Visits Requested:   Victoria Vera, PA-C 11/21/18   ADDENDUM: Hematology/Oncology Attending: I had a face-to-face encounter with the patient today.  I recommended her care plan.  Her daughter was available by phone during the visit.  This is a very pleasant 68 years old African-American female with highly suspicious lung cancer that was initially evaluated in January 2020 with imaging studies including PET scan that showed right upper lobe lung nodule.  Unfortunately the patient was lost to follow-up before coming back to the clinic for evaluation.  We repeated CT scan of the chest  recently and it showed enlargement of the right upper lobe lung mass in addition to development of mediastinal lymphadenopathy. I recommended for the patient to see Dr. Valeta Harms from pulmonary medicine for consideration of bronchoscopy with endobronchial ultrasound and biopsy of her lung mass as well as the mediastinal lymph nodes. We will see sedation after the biopsy for further evaluation and recommendation regarding treatment of her condition.  She may require a course of concurrent chemoradiation. For the end-stage renal disease, she will continue on hemodialysis as planned by nephrology. She was advised to call immediately if she has any concerning symptoms in the interval.  Disclaimer: This note was dictated with voice recognition software. Similar sounding words can inadvertently be transcribed and may be missed upon review. Eilleen Kempf, MD 11/21/18

## 2018-11-21 NOTE — Patient Instructions (Signed)
-  Referral made to [facility] for a biposy. We need a piece of the tissue to confirm what type of cancer it is so that we know what treatment is the best for you.  -We will see you back in two weeks once we have all of the results to discuss the treatment plan -Please call us if you do not hear from pulmonology soon. I put the referral in to be urgent.

## 2018-11-22 ENCOUNTER — Telehealth: Payer: Self-pay | Admitting: Nurse Practitioner

## 2018-11-22 DIAGNOSIS — N186 End stage renal disease: Secondary | ICD-10-CM | POA: Diagnosis not present

## 2018-11-22 DIAGNOSIS — D631 Anemia in chronic kidney disease: Secondary | ICD-10-CM | POA: Diagnosis not present

## 2018-11-22 DIAGNOSIS — Z992 Dependence on renal dialysis: Secondary | ICD-10-CM | POA: Diagnosis not present

## 2018-11-22 DIAGNOSIS — N2581 Secondary hyperparathyroidism of renal origin: Secondary | ICD-10-CM | POA: Diagnosis not present

## 2018-11-22 NOTE — Telephone Encounter (Signed)
Called patient to discuss labs and her Hgb slightly dropped from 7.8 to 7.5, she was at 6 1 month ago.  No answer, left message on voicemail to return call to office.  She will likely need to go to the ER for possible blood transfusion.

## 2018-11-25 ENCOUNTER — Ambulatory Visit (INDEPENDENT_AMBULATORY_CARE_PROVIDER_SITE_OTHER): Payer: Medicare Other

## 2018-11-25 DIAGNOSIS — N186 End stage renal disease: Secondary | ICD-10-CM | POA: Diagnosis not present

## 2018-11-25 DIAGNOSIS — N2581 Secondary hyperparathyroidism of renal origin: Secondary | ICD-10-CM | POA: Diagnosis not present

## 2018-11-25 DIAGNOSIS — D631 Anemia in chronic kidney disease: Secondary | ICD-10-CM | POA: Diagnosis not present

## 2018-11-25 DIAGNOSIS — Z992 Dependence on renal dialysis: Secondary | ICD-10-CM | POA: Diagnosis not present

## 2018-11-25 DIAGNOSIS — R918 Other nonspecific abnormal finding of lung field: Secondary | ICD-10-CM

## 2018-11-25 DIAGNOSIS — I10 Essential (primary) hypertension: Secondary | ICD-10-CM | POA: Diagnosis not present

## 2018-11-25 NOTE — Patient Instructions (Signed)
Social Worker Visit Information  Goals we discussed today:  Goals Addressed            This Visit's Progress     Patient Stated   . "I need a biopsy to determine what medication will help" (pt-stated)       Current Barriers:  . Limited testing to identify lesion in L lung  Clinical Social Work Clinical Goal(s):  Marland Kitchen Over the next 30 days the patient will attend lung biopsy as scheduled on 12/04/18 to assist with tissue identification . Over the next 60 days the patient will become more knowledgeable of diagnosis as evidenced by ability to verbalize treatment plan  CCM SW Interventions: Completed 11/25/2018 with the patient and her daughter [name] . Patient interviewed and appropriate assessments performed . Determined the patient has an [facility] appointment on 12/04/2018 for a left lung biopsy "they need to get a sample so we know what medicine to be on" . Assessed for transportation barriers to appointment- none identified . Encouraged the patient and her daughter to stay in contact with CM team to assist with care coordination . Collaboration with RN Case Manager regarding update to patients plan of care  Patient Self Care Activities:  . Self administers medications as prescribed . Attends all scheduled provider appointments . Calls provider office for new concerns or questions   Initial goal documentation       Other   . Assist with chronic care management enrollment and conduction of SDOH screen   On track    Current Barriers:  Marland Kitchen Knowledge Barriers related to resources and support available to address needs related to Chronic Care Management and challenges surrounding Social Determinants of Health  Clinical Social Work Clinical Goal(s):   Over the next 14 days the patienand her daughter will follow up with the [facility] as directed by SW. Completed  Over the next 20 days, the patient will understand the role of the CCM team and work with SW to complete SDOH  (Social Determinants of Health) screen.  Over the next 5 days the patient will speak with a member of her primary care team regarding recent lab results  CCM SW Interventions: Completed 11/25/2018 with patient and daughter [name] call placed to the patient and her daughter to assess progression of patient goal  Determined the patient was seen at the [facility] and was instructed she would need a lung biopsy to assist with identifying type of cancer and which medication would be most beneficial (see separate goal documentation for further intervention regarding biopsy)  Performed chart review to note attempted outreach by [name] FNP on 11/22/2018 to provide patient with recent lab results  Encouraged both the patient and her daughter to return this call promptly.   Informed by the patient she was getting ready for dialysis but would have her daughter contact the practice  Collaboration with [name] FNP to inform of SW encouragement to return Mrs. Moore's call  Scheduled follow up appointment over the next two weeks to conduct SDOH screen as the patient is out of time due to dialysis appointment  Patient Self Care Activities:   Patient currently unable to independently manage chronic conditions   Please see past updates related to this goal.        Follow Up Plan: SW will follow up with patient by phone over the next two weeks, Please contact me if assistance is needed prior to the next scheduled call.  Daneen Schick, BSW,  CDP Social Worker, Transport planner [facility] [facility] [phone]

## 2018-11-25 NOTE — Chronic Care Management (AMB) (Signed)
Chronic Care Management   Social Work Follow Up Note  11/25/2018 Name: Jessica Clarke MRN: [id number] DOB: [birth year]  Jessica Clarke is a 68 y.o. year old female who is a primary care patient of [name], [place]. The CCM team was consulted for assistance with care coordination.  Review of patient status, including review of consultants reports, other relevant assessments, and collaboration with appropriate care team members and the patient's provider was performed as part of comprehensive patient evaluation and provision of chronic care management services.    SW placed a follow up call to the patient to assess progression of goals and assist with SDOH screen. Unfortunately the patient was unable to participate in screen today due to a standing dialysis appointment. See care plan below for today's interventions.  Outpatient Encounter Medications as of 11/25/2018  Medication Sig Note  . acetaminophen (TYLENOL) 325 MG tablet Take 325-650 mg by mouth every 8 (eight) hours as needed for mild pain.    Marland Kitchen allopurinol (ZYLOPRIM) 100 MG tablet Take 100 mg by mouth daily.     . Amino Acids-Protein Hydrolys (FEEDING SUPPLEMENT, PRO-STAT SUGAR FREE 64,) LIQD Take 30 mLs by mouth 2 (two) times daily.   Marland Kitchen aspirin EC 325 MG tablet Take 1 tablet (325 mg total) by mouth daily. 10/17/2018: Regimen confirmed to be accurate by the patient  . atenolol (TENORMIN) 100 MG tablet Take by mouth.   Marland Kitchen atorvastatin (LIPITOR) 10 MG tablet Take 10 mg by mouth at bedtime.    . B Complex-Folic Acid (B COMPLEX FORMULA 1) TABS Take by mouth.   . calcitRIOL (ROCALTROL) 0.5 MCG capsule Take 2 capsules (1 mcg total) by mouth every Monday, Wednesday, and Friday with hemodialysis.   Marland Kitchen cinacalcet (SENSIPAR) 30 MG tablet Take 1 tablet (30 mg total) by mouth daily with supper.   . colchicine 0.6 MG tablet Take 0.6 mg by mouth daily as needed (as directed for gout flares or pain).    . Darbepoetin Alfa (ARANESP) 60 MCG/0.3ML SOSY  injection Inject 0.3 mLs (60 mcg total) into the vein every Friday with hemodialysis.   . heparin 1000 UNIT/ML injection Heparin Sodium (Porcine) 1,000 Units/mL Systemic   . hydrocerin (EUCERIN) CREA Apply 1 application topically 2 (two) times daily.   Marland Kitchen loperamide (IMODIUM) 2 MG capsule Take by mouth.   . midodrine (PROAMATINE) 10 MG tablet Take 1 tablet (10 mg total) by mouth 2 (two) times daily with a meal.   . Multiple Vitamin (MULTIVITAMIN) capsule Take by mouth.   . multivitamin (RENA-VIT) TABS tablet Take 1 tablet by mouth at bedtime.   . nicotine (NICODERM CQ - DOSED IN MG/24 HOURS) 14 mg/24hr patch Place 1 patch (14 mg total) onto the skin daily as needed (smoking craving).   . thiamine (VITAMIN B-1) 100 MG tablet Take 100 mg by mouth daily.   . VELPHORO 500 MG chewable tablet Chew 2 tablets by mouth 3 (three) times daily with meals.    Marland Kitchen VITAMIN D, CHOLECALCIFEROL, PO Take by mouth.    No facility-administered encounter medications on file as of 11/25/2018.      Goals Addressed            This Visit's Progress     Patient Stated   . "I need a biopsy to determine what medication will help" (pt-stated)       Current Barriers:  . Limited testing to identify lesion in L lung  Clinical Social Work Clinical Goal(s):  Marland Kitchen Over the next 30  days the patient will attend lung biopsy as scheduled on 12/04/18 to assist with tissue identification . Over the next 60 days the patient will become more knowledgeable of diagnosis as evidenced by ability to verbalize treatment plan  CCM SW Interventions: Completed 11/25/2018 with the patient and her daughter Jessica Clarke . Patient interviewed and appropriate assessments performed . Determined the patient has an [place] appointment on 12/04/2018 for a left lung biopsy "they need to get a sample so we know what medicine to be on" . Assessed for transportation barriers to appointment- none identified . Encouraged the patient and her daughter to stay  in contact with CM team to assist with care coordination . Collaboration with RN Case Manager regarding update to patients plan of care  Patient Self Care Activities:  . Self administers medications as prescribed . Attends all scheduled provider appointments . Calls provider office for new concerns or questions   Initial goal documentation       Other   . Assist with chronic care management enrollment and conduction of SDOH screen   On track    Current Barriers:  Marland Kitchen Knowledge Barriers related to resources and support available to address needs related to Chronic Care Management and challenges surrounding Social Determinants of Health  Clinical Social Work Clinical Goal(s):   Over the next 14 days the patienand her daughter will follow up with the [place] as directed by SW. Completed  Over the next 20 days, the patient will understand the role of the CCM team and work with SW to complete SDOH (Social Determinants of Health) screen.  Over the next 5 days the patient will speak with a member of her primary care team regarding recent lab results  CCM SW Interventions: Completed 11/25/2018 with patient and daughter Jessica Clarke call placed to the patient and her daughter to assess progression of patient goal  Determined the patient was seen at the [facility] and was instructed she would need a lung biopsy to assist with identifying type of cancer and which medication would be most beneficial (see separate goal documentation for further intervention regarding biopsy)  Performed chart review to note attempted outreach by [name] FNP on 11/22/2018 to provide patient with recent lab results  Encouraged both the patient and her daughter to return this call promptly.   Informed by the patient she was getting ready for dialysis but would have her daughter contact the practice  Collaboration with [name] FNP to inform of SW encouragement to return Mrs. Moore's call   Scheduled follow up appointment over the next two weeks to conduct SDOH screen as the patient is out of time due to dialysis appointment  Patient Self Care Activities:   Patient currently unable to independently manage chronic conditions   Please see past updates related to this goal.        Follow Up Plan: SW will follow up with patient by phone over the next two weeks.   Daneen Schick, BSW, CDP Social Worker, Certified Dementia Practitioner [place] [facility] [phone]  Total time spent performing care coordination and/or care management activities with the patient by phone or face to face = 9 minutes.

## 2018-11-26 ENCOUNTER — Ambulatory Visit: Payer: Self-pay

## 2018-11-26 DIAGNOSIS — N186 End stage renal disease: Secondary | ICD-10-CM

## 2018-11-26 DIAGNOSIS — Z992 Dependence on renal dialysis: Secondary | ICD-10-CM | POA: Diagnosis not present

## 2018-11-26 DIAGNOSIS — I10 Essential (primary) hypertension: Secondary | ICD-10-CM

## 2018-11-26 DIAGNOSIS — I5030 Unspecified diastolic (congestive) heart failure: Secondary | ICD-10-CM | POA: Diagnosis not present

## 2018-11-26 DIAGNOSIS — I132 Hypertensive heart and chronic kidney disease with heart failure and with stage 5 chronic kidney disease, or end stage renal disease: Secondary | ICD-10-CM | POA: Diagnosis not present

## 2018-11-26 DIAGNOSIS — R918 Other nonspecific abnormal finding of lung field: Secondary | ICD-10-CM

## 2018-11-26 DIAGNOSIS — D500 Iron deficiency anemia secondary to blood loss (chronic): Secondary | ICD-10-CM | POA: Diagnosis not present

## 2018-11-26 DIAGNOSIS — K921 Melena: Secondary | ICD-10-CM | POA: Diagnosis not present

## 2018-11-26 DIAGNOSIS — A040 Enteropathogenic Escherichia coli infection: Secondary | ICD-10-CM | POA: Diagnosis not present

## 2018-11-27 ENCOUNTER — Telehealth: Payer: Self-pay

## 2018-11-27 DIAGNOSIS — D631 Anemia in chronic kidney disease: Secondary | ICD-10-CM | POA: Diagnosis not present

## 2018-11-27 DIAGNOSIS — N186 End stage renal disease: Secondary | ICD-10-CM | POA: Diagnosis not present

## 2018-11-27 DIAGNOSIS — Z992 Dependence on renal dialysis: Secondary | ICD-10-CM | POA: Diagnosis not present

## 2018-11-27 DIAGNOSIS — N2581 Secondary hyperparathyroidism of renal origin: Secondary | ICD-10-CM | POA: Diagnosis not present

## 2018-11-28 DIAGNOSIS — E785 Hyperlipidemia, unspecified: Secondary | ICD-10-CM | POA: Diagnosis not present

## 2018-11-28 DIAGNOSIS — Z9181 History of falling: Secondary | ICD-10-CM | POA: Diagnosis not present

## 2018-11-28 DIAGNOSIS — Z992 Dependence on renal dialysis: Secondary | ICD-10-CM | POA: Diagnosis not present

## 2018-11-28 DIAGNOSIS — M109 Gout, unspecified: Secondary | ICD-10-CM | POA: Diagnosis not present

## 2018-11-28 DIAGNOSIS — K5790 Diverticulosis of intestine, part unspecified, without perforation or abscess without bleeding: Secondary | ICD-10-CM | POA: Diagnosis not present

## 2018-11-28 DIAGNOSIS — K921 Melena: Secondary | ICD-10-CM | POA: Diagnosis not present

## 2018-11-28 DIAGNOSIS — I252 Old myocardial infarction: Secondary | ICD-10-CM | POA: Diagnosis not present

## 2018-11-28 DIAGNOSIS — Z6824 Body mass index (BMI) 24.0-24.9, adult: Secondary | ICD-10-CM | POA: Diagnosis not present

## 2018-11-28 DIAGNOSIS — Z86010 Personal history of colonic polyps: Secondary | ICD-10-CM | POA: Diagnosis not present

## 2018-11-28 DIAGNOSIS — E669 Obesity, unspecified: Secondary | ICD-10-CM | POA: Diagnosis not present

## 2018-11-28 DIAGNOSIS — I959 Hypotension, unspecified: Secondary | ICD-10-CM | POA: Diagnosis not present

## 2018-11-28 DIAGNOSIS — Z85118 Personal history of other malignant neoplasm of bronchus and lung: Secondary | ICD-10-CM | POA: Diagnosis not present

## 2018-11-28 DIAGNOSIS — A040 Enteropathogenic Escherichia coli infection: Secondary | ICD-10-CM | POA: Diagnosis not present

## 2018-11-28 DIAGNOSIS — I2510 Atherosclerotic heart disease of native coronary artery without angina pectoris: Secondary | ICD-10-CM | POA: Diagnosis not present

## 2018-11-28 DIAGNOSIS — D500 Iron deficiency anemia secondary to blood loss (chronic): Secondary | ICD-10-CM | POA: Diagnosis not present

## 2018-11-28 DIAGNOSIS — I5030 Unspecified diastolic (congestive) heart failure: Secondary | ICD-10-CM | POA: Diagnosis not present

## 2018-11-28 DIAGNOSIS — K219 Gastro-esophageal reflux disease without esophagitis: Secondary | ICD-10-CM | POA: Diagnosis not present

## 2018-11-28 DIAGNOSIS — I132 Hypertensive heart and chronic kidney disease with heart failure and with stage 5 chronic kidney disease, or end stage renal disease: Secondary | ICD-10-CM | POA: Diagnosis not present

## 2018-11-28 DIAGNOSIS — F17210 Nicotine dependence, cigarettes, uncomplicated: Secondary | ICD-10-CM | POA: Diagnosis not present

## 2018-11-28 DIAGNOSIS — N186 End stage renal disease: Secondary | ICD-10-CM | POA: Diagnosis not present

## 2018-11-28 NOTE — Patient Instructions (Signed)
Visit Information  Goals Addressed      Patient Stated   . "I didn't know my blood count was low again" (pt-stated)       Current Barriers:  Marland Kitchen Knowledge Deficits related to treatment management and Care Coordination for low Hgb and HCT and possible blood transfusion . ESRD . Recent Hx of GI bleed  Nurse Case Manager Clinical Goal(s):  Marland Kitchen Over the next 7 days, patient will verbalize understanding of plan for H&H lab redraw and treatment plan for blood transfusion  CCM RN CM Interventions:  11/26/18 call completed with patient  . Evaluation of current treatment plan related to low Hgb & HCT and patient's adherence to plan as established by provider. . Advised patient to seek urgent medical attention for s/s of internal bleeding such as blood noted to vomit or stool, severe abdominal pain, bloating and unable to pass gas; discussed if shortness of breath worsens or if patient experiences chest pain to see medical attention by calling 911 and or patient becomes severely weak and or experiences syncope to see medical attention  . Provided education to patient re: recent Hgb & HCT is critically low from lab draw on 11/22/18; assessed for symptoms related to low H&H; patient report having no new symptoms, has shortness of breath on exertion but is unchanged from previous reports of shortness of breath; patient denies having any other symptoms . Reviewed medications with patient and discussed patient should continue taking her medications exactly as prescribed unless otherwise directed by her MD . Collaborated with Aldona Bar PCT regarding patient's critically low H&H; discussed the patient received hemodialysis yesterday and was seen by a Mariann Barter, PA; discussed she will notify the Laurel Dimmer, PA making rounds today of patient's recent H&H; discussed the patient is scheduled to have her labs checked during dialysis tomorrow and if a blood transfusion is ordered, Aldona Bar will coordinate per established  procedure- patient informed of such and verbalizes understanding; sent an in basket message to notify [name], FNP as well  . Discussed plans with patient for ongoing care management follow up and provided patient with direct contact information for care management team  Patient Self Care Activities with assistance of daughter . Self administers medications as prescribed . Attends all scheduled provider appointments . Calls pharmacy for medication refills . Attends church or other social activities . Performs ADL's independently . Performs IADL's independently . Calls provider office for new concerns or questions   Initial goal documentation       Other   . COMPLETED: Assist with Chronic Care Management and Care Coordination needs       Current Barriers:  Marland Kitchen Knowledge Barriers related to resources and support available to address needs related to Chronic disease management  Case Manager Clinical Goal(s):  Marland Kitchen Over the next 30 days, patient will work with the CCM team to address needs related to Chronic disease management and Care Coordination  Interventions:  . Collaborated with patient to assess for CCM RN  CM needs. See care plan for new goal.  Patient Self Care Activities:  . Attends all scheduled provider appointments . Calls pharmacy for medication refills . Calls provider office for new concerns or questions  Please see past updates related to this goal by clicking on the "Past Updates" button in the selected goal         The patient verbalized understanding of instructions provided today and declined a print copy of patient instruction materials.   Telephone follow up  appointment with care management team member scheduled for: 11/29/18  Barb Merino, RN, BSN, CCM Care Management Coordinator [facility]  Direct Phone: [phone]

## 2018-11-28 NOTE — Chronic Care Management (AMB) (Signed)
Chronic Care Management   Initial Visit Note  11/26/2018 Name: Jessica Clarke MRN: [id number] DOB: 10-18-1950  Referred by: Jessica Brine, FNP Reason for referral : Chronic Care Management (CCM RNCM Telephone Outreach )   Jessica Clarke is a 68 y.o. year old female who is a primary care patient of Jessica Clarke, [place]. The CCM team was consulted for assistance with chronic disease management and care coordination needs related to ESRD  Review of patient status, including review of consultants reports, relevant laboratory and other test results, and collaboration with appropriate care team members and the patient's provider was performed as part of comprehensive patient evaluation and provision of chronic care management services.    SDOH (Social Determinants of Health) screening performed today: None. See Care Plan for related entries.   Advanced Directives Status: N See Care Plan and [employer]yn[employer] application for related entries.   I spoke with Jessica Clarke by telephone today for care coordination related to having a low H&H follow up and possible blood transfusion.   Medications: Outpatient Encounter Medications as of 11/26/2018  Medication Sig Note  . acetaminophen (TYLENOL) 325 MG tablet Take 325-650 mg by mouth every 8 (eight) hours as needed for mild pain.    Marland Kitchen allopurinol (ZYLOPRIM) 100 MG tablet Take 100 mg by mouth daily.     . Amino Acids-Protein Hydrolys (FEEDING SUPPLEMENT, PRO-STAT SUGAR FREE 64,) LIQD Take 30 mLs by mouth 2 (two) times daily.   Marland Kitchen aspirin EC 325 MG tablet Take 1 tablet (325 mg total) by mouth daily. 10/17/2018: Regimen confirmed to be accurate by the patient  . atenolol (TENORMIN) 100 MG tablet Take by mouth.   Marland Kitchen atorvastatin (LIPITOR) 10 MG tablet Take 10 mg by mouth at bedtime.    . B Complex-Folic Acid (B COMPLEX FORMULA 1) TABS Take by mouth.   . calcitRIOL (ROCALTROL) 0.5 MCG capsule Take 2 capsules (1 mcg total) by mouth every Monday, Wednesday, and Friday with  hemodialysis.   Marland Kitchen cinacalcet (SENSIPAR) 30 MG tablet Take 1 tablet (30 mg total) by mouth daily with supper.   . colchicine 0.6 MG tablet Take 0.6 mg by mouth daily as needed (as directed for gout flares or pain).    . Darbepoetin Alfa (ARANESP) 60 MCG/0.3ML SOSY injection Inject 0.3 mLs (60 mcg total) into the vein every Friday with hemodialysis.   . heparin 1000 UNIT/ML injection Heparin Sodium (Porcine) 1,000 Units/mL Systemic   . hydrocerin (EUCERIN) CREA Apply 1 application topically 2 (two) times daily.   Marland Kitchen loperamide (IMODIUM) 2 MG capsule Take by mouth.   . midodrine (PROAMATINE) 10 MG tablet Take 1 tablet (10 mg total) by mouth 2 (two) times daily with a meal.   . Multiple Vitamin (MULTIVITAMIN) capsule Take by mouth.   . multivitamin (RENA-VIT) TABS tablet Take 1 tablet by mouth at bedtime.   . nicotine (NICODERM CQ - DOSED IN MG/24 HOURS) 14 mg/24hr patch Place 1 patch (14 mg total) onto the skin daily as needed (smoking craving).   . thiamine (VITAMIN B-1) 100 MG tablet Take 100 mg by mouth daily.   . VELPHORO 500 MG chewable tablet Chew 2 tablets by mouth 3 (three) times daily with meals.    Marland Kitchen VITAMIN D, CHOLECALCIFEROL, PO Take by mouth.    No facility-administered encounter medications on file as of 11/26/2018.      Objective:  Lab Results  Component Value Date   HGBA1C 4.9 10/23/2018   Lab Results  Component Value Date  LDLCALC 41 10/19/2018   CREATININE 3.73 (H) 11/19/2018   BP Readings from Last 3 Encounters:  11/21/18 (!) 143/76  11/19/18 132/70  11/05/18 134/74    Goals Addressed      Patient Stated   . "I didn't know my blood count was low again" (pt-stated)       Current Barriers:  Marland Kitchen Knowledge Deficits related to treatment management and Care Coordination for low Hgb and HCT and possible blood transfusion . ESRD . Recent Hx of GI bleed  Nurse Case Manager Clinical Goal(s):  Marland Kitchen Over the next 7 days, patient will verbalize understanding of plan for H&H  lab redraw and treatment plan for blood transfusion  CCM RN CM Interventions:  11/26/18 call completed with patient  . Evaluation of current treatment plan related to low Hgb & HCT and patient's adherence to plan as established by provider. . Advised patient to seek urgent medical attention for s/s of internal bleeding such as blood noted to vomit or stool, severe abdominal pain, bloating and unable to pass gas; discussed if shortness of breath worsens or if patient experiences chest pain to see medical attention by calling 911 and or patient becomes severely weak and or experiences syncope to see medical attention  . Provided education to patient re: recent Hgb & HCT is critically low from lab draw on 11/22/18; assessed for symptoms related to low H&H; patient report having no new symptoms, has shortness of breath on exertion but is unchanged from previous reports of shortness of breath; patient denies having any other symptoms . Reviewed medications with patient and discussed patient should continue taking her medications exactly as prescribed unless otherwise directed by her MD . Collaborated with Jessica Clarke PCT regarding patient's critically low H&H; discussed the patient received hemodialysis yesterday and was seen by a Jessica Barter, PA; discussed she will notify the Jessica Dimmer, PA making rounds today of patient's recent H&H; discussed the patient is scheduled to have her labs checked during dialysis tomorrow and if a blood transfusion is ordered, Jessica Clarke will coordinate per established procedure- patient informed of such and verbalizes understanding; sent an in basket message to notify Jessica Brine, FNP as well  . Discussed plans with patient for ongoing care management follow up and provided patient with direct contact information for care management team  Patient Self Care Activities with assistance of daughter . Self administers medications as prescribed . Attends all scheduled provider  appointments . Calls pharmacy for medication refills . Attends church or other social activities . Performs ADL's independently . Performs IADL's independently . Calls provider office for new concerns or questions   Initial goal documentation       Other   . COMPLETED: Assist with Chronic Care Management and Care Coordination needs       Current Barriers:  Marland Kitchen Knowledge Barriers related to resources and support available to address needs related to Chronic disease management  Case Manager Clinical Goal(s):  Marland Kitchen Over the next 30 days, patient will work with the CCM team to address needs related to Chronic disease management and Care Coordination  Interventions:  . Collaborated with patient to assess for CCM RN  CM needs. See care plan for new goal.  Patient Self Care Activities:  . Attends all scheduled provider appointments . Calls pharmacy for medication refills . Calls provider office for new concerns or questions  Please see past updates related to this goal by clicking on the "Past Updates" button in the selected goal  Plan:   Telephone follow up appointment with care management team member scheduled for: 11/29/18  Barb Merino, RN, BSN, CCM Care Management Coordinator [place] [facility]  Direct Phone: [phone]

## 2018-11-29 ENCOUNTER — Telehealth: Payer: Medicare Other

## 2018-11-29 ENCOUNTER — Ambulatory Visit: Payer: Self-pay

## 2018-11-29 ENCOUNTER — Other Ambulatory Visit: Payer: Self-pay

## 2018-11-29 DIAGNOSIS — N2581 Secondary hyperparathyroidism of renal origin: Secondary | ICD-10-CM | POA: Diagnosis not present

## 2018-11-29 DIAGNOSIS — I10 Essential (primary) hypertension: Secondary | ICD-10-CM

## 2018-11-29 DIAGNOSIS — Z992 Dependence on renal dialysis: Secondary | ICD-10-CM | POA: Diagnosis not present

## 2018-11-29 DIAGNOSIS — D631 Anemia in chronic kidney disease: Secondary | ICD-10-CM | POA: Diagnosis not present

## 2018-11-29 DIAGNOSIS — R918 Other nonspecific abnormal finding of lung field: Secondary | ICD-10-CM

## 2018-11-29 DIAGNOSIS — N186 End stage renal disease: Secondary | ICD-10-CM

## 2018-12-02 DIAGNOSIS — N186 End stage renal disease: Secondary | ICD-10-CM | POA: Diagnosis not present

## 2018-12-02 DIAGNOSIS — N2581 Secondary hyperparathyroidism of renal origin: Secondary | ICD-10-CM | POA: Diagnosis not present

## 2018-12-02 DIAGNOSIS — Z992 Dependence on renal dialysis: Secondary | ICD-10-CM | POA: Diagnosis not present

## 2018-12-02 DIAGNOSIS — D631 Anemia in chronic kidney disease: Secondary | ICD-10-CM | POA: Diagnosis not present

## 2018-12-03 ENCOUNTER — Telehealth: Payer: Self-pay

## 2018-12-03 DIAGNOSIS — A040 Enteropathogenic Escherichia coli infection: Secondary | ICD-10-CM | POA: Diagnosis not present

## 2018-12-03 DIAGNOSIS — N186 End stage renal disease: Secondary | ICD-10-CM | POA: Diagnosis not present

## 2018-12-03 DIAGNOSIS — I132 Hypertensive heart and chronic kidney disease with heart failure and with stage 5 chronic kidney disease, or end stage renal disease: Secondary | ICD-10-CM | POA: Diagnosis not present

## 2018-12-03 DIAGNOSIS — D500 Iron deficiency anemia secondary to blood loss (chronic): Secondary | ICD-10-CM | POA: Diagnosis not present

## 2018-12-03 DIAGNOSIS — I5030 Unspecified diastolic (congestive) heart failure: Secondary | ICD-10-CM | POA: Diagnosis not present

## 2018-12-03 DIAGNOSIS — K921 Melena: Secondary | ICD-10-CM | POA: Diagnosis not present

## 2018-12-03 NOTE — Patient Instructions (Signed)
Visit Information  Goals Addressed      Patient Stated   . "I didn't know my blood count was low again" (pt-stated)       Current Barriers:  Marland Kitchen Knowledge Deficits related to treatment management and Care Coordination for low Hgb and HCT and possible blood transfusion . ESRD . Recent Hx of GI bleed  Nurse Case Manager Clinical Goal(s):  Marland Kitchen Over the next 7 days, patient will verbalize understanding of plan for H&H lab redraw and treatment plan for blood transfusion   . Over the next 30 days, patient will experience no complications and or hospitalizations secondary to Anemia   CCM RN CM Interventions:  11/29/18 call completed with patient & daughter [name]  . Evaluation of current treatment plan related to low Hgb & HCT and patient's adherence to plan as established by provider. Marland Kitchen Spoke with patient briefly, she requested I contact her daughter due to she is getting on the bus for dialysis . Placed outbound call to daughter [name]; discussed the purpose of my call to f/u on patient's blood transfusion at last dialysis treatment; discussed [name] is unsure if her mother received blood following her last HD txt . Discussed her mother is feeling "about the same" and has been w/o complaints . Discussed plans with patient for ongoing care management follow up and provided patient with direct contact information for care management team  Patient Self Care Activities with assistance of daughter . Self administers medications as prescribed . Attends all scheduled provider appointments . Calls pharmacy for medication refills . Attends church or other social activities . Performs ADL's independently . Performs IADL's independently . Calls provider office for new concerns or questions   Please see past updates related to this goal by clicking on the "Past Updates" button in the selected goal      . "I need a biopsy to determine what medication will help" (pt-stated)       Current Barriers:   . Limited testing to identify lesion in L lung  Clinical Social Work Clinical Goal(s):  Marland Kitchen Over the next 30 days the patient will attend lung biopsy as scheduled on 12/04/18 to assist with tissue identification . Over the next 60 days the patient will become more knowledgeable of diagnosis as evidenced by ability to verbalize treatment plan  CCM SW Interventions: Completed 11/25/2018 with the patient and her daughter [name] . Patient interviewed and appropriate assessments performed . Determined the patient has an [place] appointment on 12/04/2018 for a left lung biopsy "they need to get a sample so we know what medicine to be on" . Assessed for transportation barriers to appointment- none identified . Encouraged the patient and her daughter to stay in contact with CM team to assist with care coordination . Collaboration with RN Case Manager regarding update to patients plan of care  CCM RN CM Interventions: 11/29/18 call completed with patient/daughter  . Evaluation of current treatment plan related to Lung Biopsy and patient's adherence to plan as established by provider. . Discussed plans with patient for ongoing care management follow up and provided patient with direct contact information for care management team . Discussed patient's daughter [name], pt's lung bx is scheduled on 12/04/18 which is normally her dialysis time; encouraged daughter to try to reschedule the lung bx or [facility] to try to reschedule patient's HD txt . Educated daughter on importance of patient not missing her HD txt's due to risk of having Pulmonary edema from  fluid overload or increased K+ which can cause a fatal heart arrhythmia . Offered to assist daughter with Care Coordination related to rescheduling the lung bx and or HD appt, daughter declines stating "I'll figure something out"  . Discussed plans with patient for ongoing care management follow up and provided patient with direct  contact information for care management team  Patient Self Care Activities:  . Self administers medications as prescribed . Attends all scheduled provider appointments . Calls provider office for new concerns or questions   Please see past updates related to this goal by clicking on the "Past Updates" button in the selected goal        Other   . To assist patient with obtaining an Oxygen Concentrator       Current Barriers:  Marland Kitchen Knowledge Deficits related to how to obtain an Oxygen concentrator for continuous Oxygen use . Non adherence to continuous daytime Oxygen therapy  Nurse Case Manager Clinical Goal(s):  Marland Kitchen Over the next 30 days, patient will work with CCM RN and PCP provider to address needs related to obtaining an order and Care Coordination for an Oxygen Concentrator for continuous Oxygen therapy . Over the next 90 days, patient will report 100% of adherence to wearing her Oxygen exactly as prescribed  CCM RN CM Interventions:  12/03/18 internal collaboration with embedded [employer]   . Evaluation of current treatment plan related to continuous Oxygen therapy and patient's adherence to plan as established by provider. Marland Kitchen Collaborated with embedded BSW Daneen Schick regarding patient is not always adhering to her continuous Oxygen therapy due to having portable Oxygen tanks only verses having an Oxygen concentrator  . Plan to discuss with patient/daughter at next f/u call and offer assistance with helping patient obtain an order for an Oxygen Concentrator to help improve compliance  Patient Self Care Activities:  . Self administers medications as prescribed . Attends all scheduled provider appointments . Calls pharmacy for medication refills . Performs ADL's independently . Performs IADL's independently . Calls provider office for new concerns or questions   Initial goal documentation        The patient verbalized understanding of instructions provided today  and declined a print copy of patient instruction materials.   Telephone follow up appointment with care management team member scheduled for: 12/05/18  Barb Merino, RN, BSN, CCM Care Management Coordinator [place] [facility]  Direct Phone: [phone]

## 2018-12-03 NOTE — Chronic Care Management (AMB) (Signed)
Chronic Care Management   Follow Up Note   11/29/2018 Name: Jessica Clarke MRN: [id number] DOB: 1950/06/28  Referred by: [name], FNP Reason for referral : Chronic Care Management (CCM RNCM Telephone Follow up)   Jessica Clarke is a 68 y.o. year old female who is a primary care patient of [name], [place]. The CCM team was consulted for assistance with chronic disease management and care coordination needs.    Review of patient status, including review of consultants reports, relevant laboratory and other test results, and collaboration with appropriate care team members and the patient's provider was performed as part of comprehensive patient evaluation and provision of chronic care management services.    SDOH (Social Determinants of Health) screening performed today: None. See Care Plan for related entries.   Advanced Directives Status: N See Care Plan and Vynca application for related entries.  I spoke with Jessica Clarke briefly and her daughter Jessica Clarke today by telephone for a CCM follow up.   Outpatient Encounter Medications as of 11/29/2018  Medication Sig Note  . acetaminophen (TYLENOL) 325 MG tablet Take 325-650 mg by mouth every 8 (eight) hours as needed for mild pain.    Marland Kitchen allopurinol (ZYLOPRIM) 100 MG tablet Take 100 mg by mouth daily.     . Amino Acids-Protein Hydrolys (FEEDING SUPPLEMENT, PRO-STAT SUGAR FREE 64,) LIQD Take 30 mLs by mouth 2 (two) times daily.   Marland Kitchen aspirin EC 325 MG tablet Take 1 tablet (325 mg total) by mouth daily. 10/17/2018: Regimen confirmed to be accurate by the patient  . atenolol (TENORMIN) 100 MG tablet Take by mouth.   Marland Kitchen atorvastatin (LIPITOR) 10 MG tablet Take 10 mg by mouth at bedtime.    . B Complex-Folic Acid (B COMPLEX FORMULA 1) TABS Take by mouth.   . calcitRIOL (ROCALTROL) 0.5 MCG capsule Take 2 capsules (1 mcg total) by mouth every Monday, Wednesday, and Friday with hemodialysis.   Marland Kitchen cinacalcet (SENSIPAR) 30 MG tablet Take 1 tablet (30  mg total) by mouth daily with supper.   . colchicine 0.6 MG tablet Take 0.6 mg by mouth daily as needed (as directed for gout flares or pain).    . Darbepoetin Alfa (ARANESP) 60 MCG/0.3ML SOSY injection Inject 0.3 mLs (60 mcg total) into the vein every Friday with hemodialysis.   . heparin 1000 UNIT/ML injection Heparin Sodium (Porcine) 1,000 Units/mL Systemic   . hydrocerin (EUCERIN) CREA Apply 1 application topically 2 (two) times daily.   Marland Kitchen loperamide (IMODIUM) 2 MG capsule Take by mouth.   . midodrine (PROAMATINE) 10 MG tablet Take 1 tablet (10 mg total) by mouth 2 (two) times daily with a meal.   . Multiple Vitamin (MULTIVITAMIN) capsule Take by mouth.   . multivitamin (RENA-VIT) TABS tablet Take 1 tablet by mouth at bedtime.   . nicotine (NICODERM CQ - DOSED IN MG/24 HOURS) 14 mg/24hr patch Place 1 patch (14 mg total) onto the skin daily as needed (smoking craving).   . thiamine (VITAMIN B-1) 100 MG tablet Take 100 mg by mouth daily.   . VELPHORO 500 MG chewable tablet Chew 2 tablets by mouth 3 (three) times daily with meals.    Marland Kitchen VITAMIN D, CHOLECALCIFEROL, PO Take by mouth.    No facility-administered encounter medications on file as of 11/29/2018.      Goals Addressed      Patient Stated   . "I didn't know my blood count was low again" (pt-stated)       Current Barriers:  .  Knowledge Deficits related to treatment management and Care Coordination for low Hgb and HCT and possible blood transfusion . ESRD . Recent Hx of GI bleed  Nurse Case Manager Clinical Goal(s):  Marland Kitchen Over the next 7 days, patient will verbalize understanding of plan for H&H lab redraw and treatment plan for blood transfusion   . Over the next 30 days, patient will experience no complications and or hospitalizations secondary to Anemia   CCM RN CM Interventions:  11/29/18 call completed with patient & daughter Jessica Clarke  . Evaluation of current treatment plan related to low Hgb & HCT and patient's adherence to  plan as established by provider. Marland Kitchen Spoke with patient briefly, she requested I contact her daughter due to she is getting on the bus for dialysis . Placed outbound call to daughter Jessica Clarke; discussed the purpose of my call to f/u on patient's blood transfusion at last dialysis treatment; discussed Jessica Clarke is unsure if her mother received blood following her last HD txt . Discussed her mother is feeling "about the same" and has been w/o complaints . Discussed plans with patient for ongoing care management follow up and provided patient with direct contact information for care management team  Patient Self Care Activities with assistance of daughter . Self administers medications as prescribed . Attends all scheduled provider appointments . Calls pharmacy for medication refills . Attends church or other social activities . Performs ADL's independently . Performs IADL's independently . Calls provider office for new concerns or questions   Please see past updates related to this goal by clicking on the "Past Updates" button in the selected goal      . "I need a biopsy to determine what medication will help" (pt-stated)       Current Barriers:  . Limited testing to identify lesion in L lung  Clinical Social Work Clinical Goal(s):  Marland Kitchen Over the next 30 days the patient will attend lung biopsy as scheduled on 12/04/18 to assist with tissue identification . Over the next 60 days the patient will become more knowledgeable of diagnosis as evidenced by ability to verbalize treatment plan  CCM SW Interventions: Completed 11/25/2018 with the patient and her daughter Jessica Clarke . Patient interviewed and appropriate assessments performed . Determined the patient has an [place] appointment on 12/04/2018 for a left lung biopsy "they need to get a sample so we know what medicine to be on" . Assessed for transportation barriers to appointment- none identified . Encouraged the patient and her daughter to stay  in contact with CM team to assist with care coordination . Collaboration with RN Case Manager regarding update to patients plan of care  CCM RN CM Interventions: 11/29/18 call completed with patient/daughter  . Evaluation of current treatment plan related to Lung Biopsy and patient's adherence to plan as established by provider. . Discussed plans with patient for ongoing care management follow up and provided patient with direct contact information for care management team . Discussed patient's daughter Jessica Clarke, pt's lung bx is scheduled on 12/04/18 which is normally her dialysis time; encouraged daughter to try to reschedule the lung bx or communicate with the [facility] to try to reschedule patient's HD txt . Educated daughter on importance of patient not missing her HD txt's due to risk of having Pulmonary edema from fluid overload or increased K+ which can cause a fatal heart arrhythmia . Offered to assist daughter with Care Coordination related to rescheduling the lung bx and or HD appt, daughter declines stating "I'll  figure something out"  . Discussed plans with patient for ongoing care management follow up and provided patient with direct contact information for care management team  Patient Self Care Activities:  . Self administers medications as prescribed . Attends all scheduled provider appointments . Calls provider office for new concerns or questions  Please see past updates related to this goal by clicking on the "Past Updates" button in the selected goal       Other   . To assist patient with obtaining an Oxygen Concentrator       Current Barriers:  Marland Kitchen Knowledge Deficits related to how to obtain an Oxygen concentrator for continuous Oxygen use . Non adherence to continuous daytime Oxygen therapy  Nurse Case Manager Clinical Goal(s):  Marland Kitchen Over the next 30 days, patient will work with CCM RN and PCP provider to address needs related to obtaining an order and Care Coordination  for an Oxygen Concentrator for continuous Oxygen therapy . Over the next 90 days, patient will report 100% of adherence to wearing her Oxygen exactly as prescribed  CCM RN CM Interventions:  12/03/18 internal collaboration with embedded [employer]   . Evaluation of current treatment plan related to continuous Oxygen therapy and patient's adherence to plan as established by provider. Marland Kitchen Collaborated with embedded BSW Daneen Schick regarding patient is not always adhering to her continuous Oxygen therapy due to having portable Oxygen tanks only verses having an Oxygen concentrator  . Plan to discuss with patient/daughter at next f/u call and offer assistance with helping patient obtain an order for an Oxygen Concentrator to help improve compliance  Patient Self Care Activities:  . Self administers medications as prescribed . Attends all scheduled provider appointments . Calls pharmacy for medication refills . Performs ADL's independently . Performs IADL's independently . Calls provider office for new concerns or questions   Initial goal documentation         Telephone follow up appointment with care management team member scheduled for: 12/05/18   Barb Merino, RN, BSN, CCM Care Management Coordinator [facility]  Direct Phone: [phone]

## 2018-12-03 NOTE — Telephone Encounter (Signed)
I returned a call to the clinical manager at [facility] Gevena Cotton [phone] and left a message that the [name], NP gave a verbal ok for the pt to have skilled nursing and that the MA's said that the call was returned when the request was left at the office on 11/04/2018 from a nurse Willis Modena.

## 2018-12-04 ENCOUNTER — Telehealth: Payer: Self-pay

## 2018-12-04 ENCOUNTER — Inpatient Hospital Stay: Payer: Medicare Other | Admitting: Internal Medicine

## 2018-12-04 ENCOUNTER — Telehealth: Payer: Self-pay | Admitting: Medical Oncology

## 2018-12-04 ENCOUNTER — Ambulatory Visit: Payer: Self-pay

## 2018-12-04 DIAGNOSIS — N186 End stage renal disease: Secondary | ICD-10-CM | POA: Diagnosis not present

## 2018-12-04 DIAGNOSIS — I10 Essential (primary) hypertension: Secondary | ICD-10-CM | POA: Diagnosis not present

## 2018-12-04 DIAGNOSIS — Z992 Dependence on renal dialysis: Secondary | ICD-10-CM | POA: Diagnosis not present

## 2018-12-04 DIAGNOSIS — I5030 Unspecified diastolic (congestive) heart failure: Secondary | ICD-10-CM | POA: Diagnosis not present

## 2018-12-04 DIAGNOSIS — D500 Iron deficiency anemia secondary to blood loss (chronic): Secondary | ICD-10-CM | POA: Diagnosis not present

## 2018-12-04 DIAGNOSIS — I132 Hypertensive heart and chronic kidney disease with heart failure and with stage 5 chronic kidney disease, or end stage renal disease: Secondary | ICD-10-CM | POA: Diagnosis not present

## 2018-12-04 DIAGNOSIS — A040 Enteropathogenic Escherichia coli infection: Secondary | ICD-10-CM | POA: Diagnosis not present

## 2018-12-04 DIAGNOSIS — R918 Other nonspecific abnormal finding of lung field: Secondary | ICD-10-CM

## 2018-12-04 DIAGNOSIS — K921 Melena: Secondary | ICD-10-CM | POA: Diagnosis not present

## 2018-12-04 NOTE — Telephone Encounter (Signed)
Per Dorothyann Peng at [facility], , pt has not been called. She will call dtr and set up appt with pulmonary.

## 2018-12-04 NOTE — Telephone Encounter (Signed)
I called Gracee with [facility] and gave verbal ok to extend pt 1 time a week for 2 weeks

## 2018-12-04 NOTE — Telephone Encounter (Signed)
askign about

## 2018-12-04 NOTE — Telephone Encounter (Signed)
Biopsy-Appt mix up. Pt at [facility] for bx, however she is not scheduled for anything at [facility]. She has not had bx or seen pulmonary. I told her she has an appt today with Julien Nordmann, but it is cancelled since she has not had bx.    Called pulmonary re referral.

## 2018-12-05 ENCOUNTER — Telehealth: Payer: Self-pay

## 2018-12-05 ENCOUNTER — Encounter: Payer: Self-pay | Admitting: *Deleted

## 2018-12-05 NOTE — Progress Notes (Signed)
Oncology Nurse Navigator Documentation  Oncology Nurse Navigator Flowsheets 12/05/2018  Navigator Location [facility]  Navigator Encounter Type Other/I checked with Dr. Juline Patch nurse to see if Jessica Clarke could be seen sooner than next week.   Telephone -  Treatment Phase Abnormal Scans  Barriers/Navigation Needs Coordination of Care  Education -  Interventions Coordination of Care  Coordination of Care -  Education Method -  Acuity Level 2-Minimal Needs (1-2 Barriers Identified)  Time Spent with Patient 15

## 2018-12-05 NOTE — Patient Instructions (Signed)
Visit Information  Goals Addressed      Patient Stated   . "I didn't know my blood count was low again" (pt-stated)       Current Barriers:  Marland Kitchen Knowledge Deficits related to treatment management and Care Coordination for low Hgb and HCT and possible blood transfusion . ESRD . Recent Hx of GI bleed  Nurse Case Manager Clinical Goal(s):  Marland Kitchen Over the next 7 days, patient will verbalize understanding of plan for H&H lab redraw and treatment plan for blood transfusion  COMPLETED . Over the next 30 days, patient will experience no complications and or hospitalizations secondary to Anemia   CCM RN CM Interventions:  12/04/18 call completed with patients daughter [name]  . Evaluation of current treatment plan related to low Hgb & HCT and patient's adherence to plan as established by provider . Spoke with patients daughter [name], she confirmed Ms. Samara did not receive a blood transfusion, however, the kidney center is closely monitoring this condition . Sent in basket message to provider [name], FNP with an update . Discussed plans with patient for ongoing care management follow up and provided patient with direct contact information for care management team  Patient Self Care Activities with assistance of daughter . Self administers medications as prescribed . Attends all scheduled provider appointments . Calls pharmacy for medication refills . Attends church or other social activities . Performs ADL's independently . Performs IADL's independently . Calls provider office for new concerns or questions   Please see past updates related to this goal by clicking on the "Past Updates" button in the selected goal      . "I need a biopsy to determine what medication will help" (pt-stated)       Current Barriers:  . Limited testing to identify lesion in L lung  Clinical Social Work Clinical Goal(s):  Marland Kitchen Over the next 30 days the patient will attend lung biopsy as scheduled on 12/04/18  to assist with tissue identification . Over the next 60 days the patient will become more knowledgeable of diagnosis as evidenced by ability to verbalize treatment plan  CCM RN CM Interventions: 12/04/18 call completed with patients daughter [name]  . Received an inbound call from daughter [name] stating she needs help coordinating her mother's lung biopsy . Discussed [name] was under the impression her mother's appt scheduled for today with Dr. Inda Merlin was for the lung biopsy, however, she learned it was not . Reviewed chart and noted referral was placed to [place] with request for Dr. Valeta Harms to perform the lung biopsy . Completed joint call with daughter [name] and Kenney Houseman from [place]; discussed a new patient consultation is needed before the biopsy can be completed; Lung Biopsy was scheduled with daughter for 12/12/18 @11 :15 am . Evaluation of current treatment plan related to Lung Biopsy and patient's adherence to plan as established by provider. . Discussed plans with patient for ongoing care management follow up and provided patient with direct contact information for care management team  Patient Self Care Activities:  . Self administers medications as prescribed . Attends all scheduled provider appointments . Calls provider office for new concerns or questions   Please see past updates related to this goal by clicking on the "Past Updates" button in the selected goal        Other   . "We haven't heard from the stomach doctor"       Daughter stated Current Barriers:  Marland Kitchen Knowledge Deficits related to treatment  management of Gastrointestinal hemorrhage and suspicious Colon polyp  Nurse Case Manager Clinical Goal(s):  Marland Kitchen Over the next 30 days, patient will have a new patient appointment with Gastroenterology for evaluation and treatment of recent GI hemorrhage and Colon polyp  CCM RN CM Interventions:  12/04/18 call completed with patients daughter  [name]  . Evaluation of current treatment plan related to recent GI hemorrhage and Colon polyp and patient's adherence to plan as established by provider. . Advised patient to contact Dr. Thornton Park at [facility] as referred following last ED/IP visit; provided the office contact number for Dr. Tarri Glenn; discussed importance of patient seeking medical attention promptly for s/s suggestive of recurrent GI bleed and discussed symptoms  . Discussed plans with patient for ongoing care management follow up and provided patient with direct contact information for care management team  Patient Self Care Activities:  . Unable to independently to manage Chronic conditions  Initial goal documentation        The patient verbalized understanding of instructions provided today and declined a print copy of patient instruction materials.   Telephone follow up appointment with care management team member scheduled for: 12/17/18  Barb Merino, RN, BSN, CCM Care Management Coordinator [facility]  Direct Phone: [phone]

## 2018-12-05 NOTE — Chronic Care Management (AMB) (Signed)
Chronic Care Management   Follow Up Note   12/04/2018 Name: Jessica Clarke MRN: [id number] DOB: 05-23-1950  Referred by: [name], FNP Reason for referral : Chronic Care Management (CCM RNCM Telephone Follow up )   Tassie Pollett is a 68 y.o. year old female who is a primary care patient of [name], [place]. The CCM team was consulted for assistance with chronic disease management and care coordination needs.    Review of patient status, including review of consultants reports, relevant laboratory and other test results, and collaboration with appropriate care team members and the patient's provider was performed as part of comprehensive patient evaluation and provision of chronic care management services.    SDOH (Social Determinants of Health) screening performed today: None. See Care Plan for related entries.   Advanced Directives Status: N See Care Plan and Vynca application for related entries.  Inbound call received from patients daughter [name] today requesting assistance with Care Coordination of her mother's lung biopsy.   Outpatient Encounter Medications as of 12/04/2018  Medication Sig Note  . acetaminophen (TYLENOL) 325 MG tablet Take 325-650 mg by mouth every 8 (eight) hours as needed for mild pain.    Marland Kitchen allopurinol (ZYLOPRIM) 100 MG tablet Take 100 mg by mouth daily.     . Amino Acids-Protein Hydrolys (FEEDING SUPPLEMENT, PRO-STAT SUGAR FREE 64,) LIQD Take 30 mLs by mouth 2 (two) times daily.   Marland Kitchen aspirin EC 325 MG tablet Take 1 tablet (325 mg total) by mouth daily. 10/17/2018: Regimen confirmed to be accurate by the patient  . atenolol (TENORMIN) 100 MG tablet Take by mouth.   Marland Kitchen atorvastatin (LIPITOR) 10 MG tablet Take 10 mg by mouth at bedtime.    . B Complex-Folic Acid (B COMPLEX FORMULA 1) TABS Take by mouth.   . calcitRIOL (ROCALTROL) 0.5 MCG capsule Take 2 capsules (1 mcg total) by mouth every Monday, Wednesday, and Friday with hemodialysis.   Marland Kitchen cinacalcet  (SENSIPAR) 30 MG tablet Take 1 tablet (30 mg total) by mouth daily with supper.   . colchicine 0.6 MG tablet Take 0.6 mg by mouth daily as needed (as directed for gout flares or pain).    . Darbepoetin Alfa (ARANESP) 60 MCG/0.3ML SOSY injection Inject 0.3 mLs (60 mcg total) into the vein every Friday with hemodialysis.   . heparin 1000 UNIT/ML injection Heparin Sodium (Porcine) 1,000 Units/mL Systemic   . hydrocerin (EUCERIN) CREA Apply 1 application topically 2 (two) times daily.   Marland Kitchen loperamide (IMODIUM) 2 MG capsule Take by mouth.   . midodrine (PROAMATINE) 10 MG tablet Take 1 tablet (10 mg total) by mouth 2 (two) times daily with a meal.   . Multiple Vitamin (MULTIVITAMIN) capsule Take by mouth.   . multivitamin (RENA-VIT) TABS tablet Take 1 tablet by mouth at bedtime.   . nicotine (NICODERM CQ - DOSED IN MG/24 HOURS) 14 mg/24hr patch Place 1 patch (14 mg total) onto the skin daily as needed (smoking craving).   . thiamine (VITAMIN B-1) 100 MG tablet Take 100 mg by mouth daily.   . VELPHORO 500 MG chewable tablet Chew 2 tablets by mouth 3 (three) times daily with meals.    Marland Kitchen VITAMIN D, CHOLECALCIFEROL, PO Take by mouth.    No facility-administered encounter medications on file as of 12/04/2018.      Goals Addressed      Patient Stated   . "I didn't know my blood count was low again" (pt-stated)       Current  Barriers:  Marland Kitchen Knowledge Deficits related to treatment management and Care Coordination for low Hgb and HCT and possible blood transfusion . ESRD . Recent Hx of GI bleed  Nurse Case Manager Clinical Goal(s):  Marland Kitchen Over the next 7 days, patient will verbalize understanding of plan for H&H lab redraw and treatment plan for blood transfusion  COMPLETED . Over the next 30 days, patient will experience no complications and or hospitalizations secondary to Anemia   CCM RN CM Interventions:  12/04/18 call completed with patients daughter [name]  . Evaluation of current treatment plan  related to low Hgb & HCT and patient's adherence to plan as established by provider . Spoke with patients daughter [name], she confirmed Ms. Flores did not receive a blood transfusion, however, the kidney center is closely monitoring this condition . Sent in basket message to provider [name], FNP with an update . Discussed plans with patient for ongoing care management follow up and provided patient with direct contact information for care management team  Patient Self Care Activities with assistance of daughter . Self administers medications as prescribed . Attends all scheduled provider appointments . Calls pharmacy for medication refills . Attends church or other social activities . Performs ADL's independently . Performs IADL's independently . Calls provider office for new concerns or questions  Please see past updates related to this goal by clicking on the "Past Updates" button in the selected goal      . "I need a biopsy to determine what medication will help" (pt-stated)       Current Barriers:  . Limited testing to identify lesion in L lung  Clinical Social Work Clinical Goal(s):  Marland Kitchen Over the next 30 days the patient will attend lung biopsy as scheduled on 12/04/18 to assist with tissue identification . Over the next 60 days the patient will become more knowledgeable of diagnosis as evidenced by ability to verbalize treatment plan  CCM RN CM Interventions: 12/04/18 call completed with patients daughter [name]  . Received an inbound call from daughter [name] stating she needs help coordinating her mother's lung biopsy . Discussed [name] was under the impression her mother's appt scheduled for today with Dr. Inda Merlin was for the lung biopsy, however, she learned it was not . Reviewed chart and noted referral was placed to [place] with request for Dr. Valeta Harms to perform the lung biopsy . Completed joint call with daughter [name] and Kenney Houseman from [place];  discussed a new patient consultation is needed before the biopsy can be completed; Lung Biopsy was scheduled with daughter for 12/12/18 @11 :15 am . Evaluation of current treatment plan related to Lung Biopsy and patient's adherence to plan as established by provider. . Discussed plans with patient for ongoing care management follow up and provided patient with direct contact information for care management team  Patient Self Care Activities:  . Self administers medications as prescribed . Attends all scheduled provider appointments . Calls provider office for new concerns or questions   Please see past updates related to this goal by clicking on the "Past Updates" button in the selected goal        Other   . "We haven't heard from the stomach doctor"       Daughter stated Current Barriers:  Marland Kitchen Knowledge Deficits related to treatment management of Gastrointestinal hemorrhage and suspicious Colon polyp  Nurse Case Manager Clinical Goal(s):  Marland Kitchen Over the next 30 days, patient will have a new patient appointment with Gastroenterology for evaluation  and treatment of recent GI hemorrhage and Colon polyp  CCM RN CM Interventions:  12/04/18 call completed with patients daughter [name]  . Evaluation of current treatment plan related to recent GI hemorrhage and Colon polyp and patient's adherence to plan as established by provider. . Advised patient to contact Dr. Thornton Park at [facility] as referred following last ED/IP visit; provided the office contact number for Dr. Tarri Glenn; discussed importance of patient seeking medical attention promptly for s/s suggestive of recurrent GI bleed and discussed symptoms  . Discussed plans with patient for ongoing care management follow up and provided patient with direct contact information for care management team  Patient Self Care Activities:  . Unable to independently to manage Chronic conditions  Initial goal documentation          Telephone follow up appointment with care management team member scheduled for: 12/17/18   Barb Merino, RN, BSN, CCM Care Management Coordinator [facility]  Direct Phone: [phone]

## 2018-12-06 ENCOUNTER — Telehealth: Payer: Self-pay | Admitting: *Deleted

## 2018-12-06 DIAGNOSIS — N186 End stage renal disease: Secondary | ICD-10-CM | POA: Diagnosis not present

## 2018-12-06 DIAGNOSIS — D631 Anemia in chronic kidney disease: Secondary | ICD-10-CM | POA: Diagnosis not present

## 2018-12-06 DIAGNOSIS — Z992 Dependence on renal dialysis: Secondary | ICD-10-CM | POA: Diagnosis not present

## 2018-12-06 DIAGNOSIS — N2581 Secondary hyperparathyroidism of renal origin: Secondary | ICD-10-CM | POA: Diagnosis not present

## 2018-12-06 NOTE — Telephone Encounter (Signed)
Contacted and spoke with patient's daughter, [name], to arrange a home visit. Visit scheduled for 12/10/18 at 2:00 pm.

## 2018-12-09 ENCOUNTER — Encounter: Payer: Self-pay | Admitting: Gastroenterology

## 2018-12-09 ENCOUNTER — Other Ambulatory Visit (INDEPENDENT_AMBULATORY_CARE_PROVIDER_SITE_OTHER): Payer: Medicare Other

## 2018-12-09 ENCOUNTER — Ambulatory Visit (INDEPENDENT_AMBULATORY_CARE_PROVIDER_SITE_OTHER): Payer: Medicare Other | Admitting: Gastroenterology

## 2018-12-09 VITALS — BP 116/64 | HR 60 | Temp 97.7°F | Ht 68.0 in | Wt 157.0 lb

## 2018-12-09 DIAGNOSIS — A09 Infectious gastroenteritis and colitis, unspecified: Secondary | ICD-10-CM | POA: Diagnosis not present

## 2018-12-09 DIAGNOSIS — K922 Gastrointestinal hemorrhage, unspecified: Secondary | ICD-10-CM | POA: Diagnosis not present

## 2018-12-09 LAB — COMPREHENSIVE METABOLIC PANEL
ALT: 8 U/L (ref 0–35)
AST: 16 U/L (ref 0–37)
Albumin: 3.1 g/dL — ABNORMAL LOW (ref 3.5–5.2)
Alkaline Phosphatase: 179 U/L — ABNORMAL HIGH (ref 39–117)
BUN: 33 mg/dL — ABNORMAL HIGH (ref 6–23)
CO2: 28 mEq/L (ref 19–32)
Calcium: 9.1 mg/dL (ref 8.4–10.5)
Chloride: 97 mEq/L (ref 96–112)
Creatinine, Ser: 6.27 mg/dL (ref 0.40–1.20)
GFR: 7.98 mL/min — CL (ref >60.00)
Glucose, Bld: 83 mg/dL (ref 70–99)
Potassium: 4.1 mEq/L (ref 3.5–5.1)
Sodium: 135 mEq/L (ref 135–145)
Total Bilirubin: 0.7 mg/dL (ref 0.2–1.2)
Total Protein: 7.4 g/dL (ref 6.0–8.3)

## 2018-12-09 LAB — CBC
HCT: 27.9 % — ABNORMAL LOW (ref 36.0–46.0)
Hemoglobin: 9.2 g/dL — ABNORMAL LOW (ref 12.0–15.0)
MCHC: 32.5 g/dL (ref 30.0–36.0)
MCV: 102.4 fl — ABNORMAL HIGH (ref 78.0–100.0)
Platelets: 197 10*3/uL (ref 150.0–400.0)
RBC: 2.72 Mil/uL — ABNORMAL LOW (ref 3.87–5.11)
RDW: 18.1 % — ABNORMAL HIGH (ref 11.5–15.5)
WBC: 4.6 10*3/uL (ref 4.0–10.5)

## 2018-12-09 NOTE — Patient Instructions (Signed)
Your provider has requested that you go to the basement level for lab work before leaving today. Press "B" on the elevator. The lab is located at the first door on the left as you exit the elevator.  Fiber Chart  You should be consuming 25-30g of fiber per day and drinking 8 glasses of water to help your bowels move regularly.  In the chart below you can look up how much fiber you are getting in an average day.  If you are not getting enough fiber, you should add a fiber supplement to your diet.  Examples of this include Metamucil, FiberCon and Citrucel.  These can be purchased at your local grocery store or pharmacy.      [email or web address]

## 2018-12-09 NOTE — Progress Notes (Signed)
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 Infectious etiology suspected. Post-infectious diarrhea-IBS is also a possibility.  Labs today to follow-up on anemia and monitor for electrolyte disturbances related to ongoing diarrhea.   PLAN: CMP, CBC Stool pathogen panel Add daily stool bulking agent like Metamucil Followup in the office with me or an APP in 2 weeks  Please see the "Patient Instructions" section for addition details about the plan.  HPI: Jessica Clarke is a 68 y.o. female who returns in hospital follow-up. The patient has ESRD and is on dialysis, hypertension, CAD, diastolic CHF. She was recently hospitalized for bloody diarrhea found to be due to enteropathogenic E Coli. The interval history is obtained through the patient and review of her electronic health record.   Colonoscopy 10/20/18: - Diverticulosis in the sigmoid colon, in the descending colon and in the ascending colon. - Two 2 to 3 mm cecal tubular adenomas - Patches of very mild erythematous, friable (with contact bleeding) and granular mucosa were seen throughout examined colon. Biopsied. Histologically normal - The examination was otherwise normal on direct and retroflexion views.  Stools positive for C diff and Enteropathogenic E coli (EPEC). She was treated with 5 days of ciprofloxacin.   C Diff antigen was positive  but PCR was negative.   Required 3 units of PRBC prior to her diagnosis of colitis.   She continues to have diarrhea with frequent accidents. Have 3-4 BM daily. No further bleeding. Some mucous. Stools are intermittently black.   Hemoglobin has declined from 7.8 to 7.5. She was 6.1 at the time of her last hospital.  BUN was 74 in September and declined to 19 earlier this morning.   No known family history of colon cancer or polyps. No family history of uterine/endometrial cancer, pancreatic cancer or gastric/stomach cancer.   Past Medical History:  Diagnosis Date  . Abnormal Pap smear    ASCUS ?HGSIL  . Anemia   . Cervical dysplasia   . CHF (congestive heart failure) ([place])   . Chronic kidney disease   . Constipation   . Constipation   . GERD (gastroesophageal reflux disease)   . Gout   . Hyperlipidemia   . Hypertension   . Myocardial infarction ([place]) 2015  . Obesity   . Pneumonia   . Uterine polyp     Past Surgical History:  Procedure Laterality Date  . AV FISTULA PLACEMENT  01/18/2012   Procedure: ARTERIOVENOUS (AV) FISTULA CREATION;  Surgeon: Angelia Mould, MD;  Location: [place];  Service: Vascular;  Laterality: Left;  . BIOPSY  10/20/2018   Procedure: BIOPSY;  Surgeon: Thornton Park, MD;  Location: [place];  Service: Gastroenterology;;  . COLONOSCOPY WITH PROPOFOL N/A 10/20/2018   Procedure: COLONOSCOPY WITH PROPOFOL;  Surgeon: Thornton Park, MD;  Location: [place];  Service: Gastroenterology;  Laterality: N/A;  . DILATION AND CURETTAGE OF UTERUS    .  HYSTEROSCOPY    . LEEP    . LEFT HEART CATHETERIZATION WITH CORONARY ANGIOGRAM N/A 08/26/2013   Procedure: LEFT HEART CATHETERIZATION WITH CORONARY ANGIOGRAM;  Surgeon: Leonie Man, MD;  Location: [facility] CATH LAB;  Service: Cardiovascular;  Laterality: N/A;  . POLYPECTOMY  10/20/2018   Procedure: POLYPECTOMY;  Surgeon: Thornton Park, MD;  Location: [place];  Service: Gastroenterology;;  .  REVISON OF ARTERIOVENOUS FISTULA Left 2/40/9735   Procedure: PLICATION OF ARTERIOVENOUS FISTULA;  Surgeon: Conrad Sharon, MD;  Location: [place];  Service: Vascular;  Laterality: Left;    Current Outpatient Medications  Medication Sig Dispense Refill  . acetaminophen (TYLENOL) 325 MG tablet Take 325-650 mg by mouth every 8 (eight) hours as needed for mild pain.     Marland Kitchen allopurinol (ZYLOPRIM) 100 MG tablet Take 100 mg by mouth daily.      . Amino Acids-Protein Hydrolys (FEEDING SUPPLEMENT, PRO-STAT SUGAR FREE 64,) LIQD Take 30 mLs by mouth 2 (two) times daily. 887 mL 0  . aspirin EC 325 MG tablet Take 1 tablet (325 mg total) by mouth daily. 30 tablet 0  . atenolol (TENORMIN) 100 MG tablet Take by mouth.    Marland Kitchen atorvastatin (LIPITOR) 10 MG tablet Take 10 mg by mouth at bedtime.   0  . B Complex-Folic Acid (B COMPLEX FORMULA 1) TABS Take by mouth.    . calcitRIOL (ROCALTROL) 0.5 MCG capsule Take 2 capsules (1 mcg total) by mouth every Monday, Wednesday, and Friday with hemodialysis. 30 capsule 0  . cinacalcet (SENSIPAR) 30 MG tablet Take 1 tablet (30 mg total) by mouth daily with supper. 60 tablet 0  . colchicine 0.6 MG tablet Take 0.6 mg by mouth daily as needed (as directed for gout flares or pain).     . Darbepoetin Alfa (ARANESP) 60 MCG/0.3ML SOSY injection Inject 0.3 mLs (60 mcg total) into the vein every Friday with hemodialysis. 4.2 mL   . heparin 1000 UNIT/ML injection Heparin Sodium (Porcine) 1,000 Units/mL Systemic    . hydrocerin (EUCERIN) CREA Apply 1 application topically 2 (two) times daily. 228 g 0  . loperamide (IMODIUM) 2 MG capsule Take by mouth.    . midodrine (PROAMATINE) 10 MG tablet Take 1 tablet (10 mg total) by mouth 2 (two) times daily with a meal. 60 tablet 0  . Multiple Vitamin (MULTIVITAMIN) capsule Take by mouth.    . multivitamin (RENA-VIT) TABS tablet Take 1 tablet by mouth at bedtime. 30 tablet 0  . nicotine (NICODERM CQ - DOSED IN MG/24 HOURS) 14 mg/24hr patch Place 1  patch (14 mg total) onto the skin daily as needed (smoking craving). 28 patch 0  . thiamine (VITAMIN B-1) 100 MG tablet Take 100 mg by mouth daily.    . VELPHORO 500 MG chewable tablet Chew 2 tablets by mouth 3 (three) times daily with meals.     Marland Kitchen VITAMIN D, CHOLECALCIFEROL, PO Take by mouth.     No current facility-administered medications for this visit.     Allergies as of 12/09/2018  . (No Known Allergies)    Family History  Problem Relation Age of Onset  . Esophageal cancer Other   . Pancreatic cancer Other   . Heart disease Mother   . Hypertension Mother   . Breast cancer Sister   . Esophageal cancer Sister   . Cancer Sister   . Deep vein thrombosis Sister   . Diabetes Sister   . Hypertension Sister   . Hypertension Daughter   .  Colon cancer Neg Hx     Social History   Socioeconomic History  . Marital status: Married    Spouse name: Not on file  . Number of children: 1  . Years of education: Not on file  . Highest education level: Not on file  Occupational History    Employer: UNEMPLOYED    Comment: Unemployed   Social Needs  . Financial resource strain: Not on file  . Food insecurity    Worry: Not on file    Inability: Not on file  . Transportation needs    Medical: Not on file    Non-medical: Not on file  Tobacco Use  . Smoking status: Current Every Day Smoker    Packs/day: 0.50    Years: 30.00    Pack years: 15.00    Types: Cigarettes  . Smokeless tobacco: Never Used  Substance and Sexual Activity  . Alcohol use: Yes    Alcohol/week: 6.0 standard drinks    Types: 6 Cans of beer per week  . Drug use: No  . Sexual activity: Never    Birth control/protection: Post-menopausal    Comment: Did not ask  Lifestyle  . Physical activity    Days per week: Not on file    Minutes per session: Not on file  . Stress: Not on file  Relationships  . Social Herbalist on phone: Not on file    Gets together: Not on file    Attends religious  service: Not on file    Active member of club or organization: Not on file    Attends meetings of clubs or organizations: Not on file    Relationship status: Not on file  . Intimate partner violence    Fear of current or ex partner: Not on file    Emotionally abused: Not on file    Physically abused: Not on file    Forced sexual activity: Not on file  Other Topics Concern  . Not on file  Social History Narrative  . Not on file    Review of Systems: 12 system ROS is negative except as noted above.   Physical Exam: General:   Alert,  well-nourished, pleasant and cooperative in NAD Head:  Normocephalic and atraumatic. Eyes:  Sclera clear, no icterus.   Conjunctiva pink. Ears:  Normal auditory acuity. Nose:  No deformity, discharge,  or lesions. Mouth:  No deformity or lesions.   Neck:  Supple; no masses or thyromegaly. Lungs:  Clear throughout to auscultation.   No wheezes. Heart:  Regular rate and rhythm; no murmurs. Abdomen:  Soft,nontender, nondistended, normal bowel sounds, no rebound or guarding. No hepatosplenomegaly.   Rectal:  Deferred  Msk:  Symmetrical. No boney deformities LAD: No inguinal or umbilical LAD Extremities:  No clubbing or edema. Neurologic:  Alert and  oriented x4;  grossly nonfocal Skin:  Intact without significant lesions or rashes. Psych:  Alert and cooperative. Normal mood and affect.    Jessica Clarke L. Tarri Glenn, MD, MPH 12/09/2018, 3:01 PM

## 2018-12-10 ENCOUNTER — Other Ambulatory Visit: Payer: Self-pay

## 2018-12-10 ENCOUNTER — Telehealth: Payer: Self-pay | Admitting: Nurse Practitioner

## 2018-12-10 ENCOUNTER — Other Ambulatory Visit: Payer: Medicare Other | Admitting: *Deleted

## 2018-12-10 ENCOUNTER — Telehealth: Payer: Self-pay

## 2018-12-10 DIAGNOSIS — N186 End stage renal disease: Secondary | ICD-10-CM | POA: Diagnosis not present

## 2018-12-10 DIAGNOSIS — I5030 Unspecified diastolic (congestive) heart failure: Secondary | ICD-10-CM | POA: Diagnosis not present

## 2018-12-10 DIAGNOSIS — Z515 Encounter for palliative care: Secondary | ICD-10-CM

## 2018-12-10 DIAGNOSIS — D500 Iron deficiency anemia secondary to blood loss (chronic): Secondary | ICD-10-CM | POA: Diagnosis not present

## 2018-12-10 DIAGNOSIS — K921 Melena: Secondary | ICD-10-CM | POA: Diagnosis not present

## 2018-12-10 DIAGNOSIS — A040 Enteropathogenic Escherichia coli infection: Secondary | ICD-10-CM | POA: Diagnosis not present

## 2018-12-10 DIAGNOSIS — I132 Hypertensive heart and chronic kidney disease with heart failure and with stage 5 chronic kidney disease, or end stage renal disease: Secondary | ICD-10-CM | POA: Diagnosis not present

## 2018-12-10 NOTE — Telephone Encounter (Signed)
I called the patient to schedule her AWV with [place].  She asked me to call back in 15-20 minutes.  When I called back, there was no answer and the voicemail was full.

## 2018-12-11 DIAGNOSIS — N2581 Secondary hyperparathyroidism of renal origin: Secondary | ICD-10-CM | POA: Diagnosis not present

## 2018-12-11 DIAGNOSIS — Z992 Dependence on renal dialysis: Secondary | ICD-10-CM | POA: Diagnosis not present

## 2018-12-11 DIAGNOSIS — N186 End stage renal disease: Secondary | ICD-10-CM | POA: Diagnosis not present

## 2018-12-11 DIAGNOSIS — D631 Anemia in chronic kidney disease: Secondary | ICD-10-CM | POA: Diagnosis not present

## 2018-12-11 NOTE — H&P (View-Only) (Signed)
Synopsis: Referred in October 2020 for mediastinal adenopathy, lung mass by [name], FNP  Subjective:   PATIENT ID: [name] GENDER: female DOB: 1950/08/23, MRN: [id number]  Chief Complaint  Patient presents with  . Consult    Abnormal CT. CT 9.29. She reports having increased SOB and chest pain for months. Diarrhea started last night. She saw GI monday. Dialysis MWF.     68 year old female with past medical history of tobacco abuse, history of hyperlipidemia, hypertension, chronic kidney disease, gastroesophageal reflux disease.  Patient was found to have abnormal chest imaging in November 12, 2018.  She was found to have a large lung mass as well as mediastinal adenopathy concerning for an advanced stage lung cancer.  Patient was referred to Dr. Lew Dawes office at medical oncology who has referred the patient for [place] for evaluation of bronchoscopy.  Patient here today with no significant symptoms except fatigue and weight loss.  She is unfortunately still smoking.  We talked about this today in the office.  Patient denies hemoptysis.  She overall is doing okay.  Her daughter is present with her today in the office.      Past Medical History:  Diagnosis Date  . Abnormal Pap smear    ASCUS ?HGSIL  . Anemia   . Cervical dysplasia   . CHF (congestive heart failure) ([place])   . Chronic kidney disease   . Constipation   . Constipation   . GERD (gastroesophageal reflux disease)   . Gout   . Hyperlipidemia   . Hypertension   . Myocardial infarction ([place]) 2015  . Obesity   . Pneumonia   . Uterine polyp      Family History  Problem Relation Age of Onset  . Esophageal cancer Other   . Pancreatic cancer Other   . Heart disease Mother   . Hypertension Mother   . Breast cancer Sister   . Esophageal cancer Sister   . Cancer Sister   . Deep vein thrombosis Sister   . Diabetes Sister   . Hypertension Sister   . Hypertension Daughter   . Colon cancer Neg Hx      Past  Surgical History:  Procedure Laterality Date  . AV FISTULA PLACEMENT  01/18/2012   Procedure: ARTERIOVENOUS (AV) FISTULA CREATION;  Surgeon: Angelia Mould, MD;  Location: [place];  Service: Vascular;  Laterality: Left;  . BIOPSY  10/20/2018   Procedure: BIOPSY;  Surgeon: Thornton Park, MD;  Location: [place];  Service: Gastroenterology;;  . COLONOSCOPY WITH PROPOFOL N/A 10/20/2018   Procedure: COLONOSCOPY WITH PROPOFOL;  Surgeon: Thornton Park, MD;  Location: [place];  Service: Gastroenterology;  Laterality: N/A;  . DILATION AND CURETTAGE OF UTERUS    . HYSTEROSCOPY    . LEEP    . LEFT HEART CATHETERIZATION WITH CORONARY ANGIOGRAM N/A 08/26/2013   Procedure: LEFT HEART CATHETERIZATION WITH CORONARY ANGIOGRAM;  Surgeon: Leonie Man, MD;  Location: [facility] CATH LAB;  Service: Cardiovascular;  Laterality: N/A;  . POLYPECTOMY  10/20/2018   Procedure: POLYPECTOMY;  Surgeon: Thornton Park, MD;  Location: [place];  Service: Gastroenterology;;  . REVISON OF ARTERIOVENOUS FISTULA Left 08/09/9483   Procedure: PLICATION OF ARTERIOVENOUS FISTULA;  Surgeon: Conrad Sedalia, MD;  Location: [place];  Service: Vascular;  Laterality: Left;    Social History   Socioeconomic History  . Marital status: Married    Spouse name: Not on file  . Number of children: 1  . Years of education: Not on file  .  Highest education level: Not on file  Occupational History    Employer: UNEMPLOYED    Comment: Unemployed   Social Needs  . Financial resource strain: Not on file  . Food insecurity    Worry: Not on file    Inability: Not on file  . Transportation needs    Medical: Not on file    Non-medical: Not on file  Tobacco Use  . Smoking status: Current Every Day Smoker    Packs/day: 0.50    Years: 30.00    Pack years: 15.00    Types: Cigarettes  . Smokeless tobacco: Never Used  Substance and Sexual Activity  . Alcohol use: Yes    Alcohol/week: 6.0 standard drinks    Types: 6 Cans of  beer per week  . Drug use: No  . Sexual activity: Never    Birth control/protection: Post-menopausal    Comment: Did not ask  Lifestyle  . Physical activity    Days per week: Not on file    Minutes per session: Not on file  . Stress: Not on file  Relationships  . Social Herbalist on phone: Not on file    Gets together: Not on file    Attends religious service: Not on file    Active member of club or organization: Not on file    Attends meetings of clubs or organizations: Not on file    Relationship status: Not on file  . Intimate partner violence    Fear of current or ex partner: Not on file    Emotionally abused: Not on file    Physically abused: Not on file    Forced sexual activity: Not on file  Other Topics Concern  . Not on file  Social History Narrative  . Not on file     No Known Allergies   Outpatient Medications Prior to Visit  Medication Sig Dispense Refill  . acetaminophen (TYLENOL) 325 MG tablet Take 325-650 mg by mouth every 8 (eight) hours as needed for mild pain.     Marland Kitchen allopurinol (ZYLOPRIM) 100 MG tablet Take 100 mg by mouth daily.      . Amino Acids-Protein Hydrolys (FEEDING SUPPLEMENT, PRO-STAT SUGAR FREE 64,) LIQD Take 30 mLs by mouth 2 (two) times daily. 887 mL 0  . aspirin EC 325 MG tablet Take 1 tablet (325 mg total) by mouth daily. 30 tablet 0  . atenolol (TENORMIN) 100 MG tablet Take by mouth.    Marland Kitchen atorvastatin (LIPITOR) 10 MG tablet Take 10 mg by mouth at bedtime.   0  . B Complex-Folic Acid (B COMPLEX FORMULA 1) TABS Take by mouth.    . calcitRIOL (ROCALTROL) 0.5 MCG capsule Take 2 capsules (1 mcg total) by mouth every Monday, Wednesday, and Friday with hemodialysis. 30 capsule 0  . cinacalcet (SENSIPAR) 30 MG tablet Take 1 tablet (30 mg total) by mouth daily with supper. 60 tablet 0  . colchicine 0.6 MG tablet Take 0.6 mg by mouth daily as needed (as directed for gout flares or pain).     . Darbepoetin Alfa (ARANESP) 60 MCG/0.3ML SOSY  injection Inject 0.3 mLs (60 mcg total) into the vein every Friday with hemodialysis. 4.2 mL   . heparin 1000 UNIT/ML injection Heparin Sodium (Porcine) 1,000 Units/mL Systemic    . hydrocerin (EUCERIN) CREA Apply 1 application topically 2 (two) times daily. 228 g 0  . loperamide (IMODIUM) 2 MG capsule Take by mouth.    . midodrine (PROAMATINE) 10  MG tablet Take 1 tablet (10 mg total) by mouth 2 (two) times daily with a meal. 60 tablet 0  . multivitamin (RENA-VIT) TABS tablet Take 1 tablet by mouth at bedtime. 30 tablet 0  . nicotine (NICODERM CQ - DOSED IN MG/24 HOURS) 14 mg/24hr patch Place 1 patch (14 mg total) onto the skin daily as needed (smoking craving). 28 patch 0  . thiamine (VITAMIN B-1) 100 MG tablet Take 100 mg by mouth daily.    . VELPHORO 500 MG chewable tablet Chew 2 tablets by mouth 3 (three) times daily with meals.     Marland Kitchen VITAMIN D, CHOLECALCIFEROL, PO Take by mouth.    . Multiple Vitamin (MULTIVITAMIN) capsule Take by mouth.     No facility-administered medications prior to visit.     Review of Systems  Constitutional: Positive for malaise/fatigue and weight loss. Negative for chills and fever.  HENT: Negative for hearing loss, sore throat and tinnitus.   Eyes: Negative for blurred vision and double vision.  Respiratory: Positive for cough and shortness of breath. Negative for hemoptysis, sputum production, wheezing and stridor.   Cardiovascular: Negative for chest pain, palpitations, orthopnea, leg swelling and PND.  Gastrointestinal: Negative for abdominal pain, constipation, diarrhea, heartburn, nausea and vomiting.  Genitourinary: Negative for dysuria, hematuria and urgency.  Musculoskeletal: Negative for joint pain and myalgias.  Skin: Negative for itching and rash.  Neurological: Negative for dizziness, tingling, weakness and headaches.  Endo/Heme/Allergies: Negative for environmental allergies. Does not bruise/bleed easily.  Psychiatric/Behavioral: Negative for  depression. The patient is not nervous/anxious and does not have insomnia.   All other systems reviewed and are negative.    Objective:  Physical Exam Vitals signs reviewed.  Constitutional:      General: She is not in acute distress.    Appearance: She is well-developed.  HENT:     Head: Normocephalic and atraumatic.     Mouth/Throat:     Pharynx: No oropharyngeal exudate.  Eyes:     Conjunctiva/sclera: Conjunctivae normal.     Pupils: Pupils are equal, round, and reactive to light.  Neck:     Vascular: No JVD.     Trachea: No tracheal deviation.     Comments: Loss of supraclavicular fat Cardiovascular:     Rate and Rhythm: Normal rate and regular rhythm.     Heart sounds: S1 normal and S2 normal.     Comments: Distant heart tones Pulmonary:     Effort: No tachypnea or accessory muscle usage.     Breath sounds: No stridor. Decreased breath sounds (throughout all lung fields) present. No wheezing, rhonchi or rales.  Abdominal:     General: Bowel sounds are normal. There is no distension.     Palpations: Abdomen is soft.     Tenderness: There is no abdominal tenderness.  Musculoskeletal:        General: Deformity (muscle wasting ) present.  Skin:    General: Skin is warm and dry.     Capillary Refill: Capillary refill takes less than 2 seconds.     Findings: No rash.  Neurological:     Mental Status: She is alert and oriented to person, place, and time.  Psychiatric:        Behavior: Behavior normal.      Vitals:   12/12/18 1131  BP: 110/72  Pulse: 71  Temp: 98 F (36.7 C)  TempSrc: Temporal  SpO2: 95%  Weight: 149 lb 3.2 oz (67.7 kg)  Height: 5\' 10"  (1.778 m)  95% on RA BMI Readings from Last 3 Encounters:  12/12/18 21.41 kg/m  12/09/18 23.87 kg/m  11/21/18 22.92 kg/m   Wt Readings from Last 3 Encounters:  12/12/18 149 lb 3.2 oz (67.7 kg)  12/09/18 157 lb (71.2 kg)  11/21/18 154 lb 4.8 oz (70 kg)     CBC    Component Value Date/Time   WBC  4.6 12/09/2018 1535   RBC 2.72 (L) 12/09/2018 1535   HGB 9.2 Repeated and verified X2. (L) 12/09/2018 1535   HGB 7.5 (L) 11/19/2018 1640   HCT 27.9 (L) 12/09/2018 1535   HCT 23.0 (L) 11/19/2018 1640   PLT 197.0 12/09/2018 1535   PLT 216 11/19/2018 1640   MCV 102.4 (H) 12/09/2018 1535   MCV 100 (H) 11/19/2018 1640   MCH 32.6 11/19/2018 1640   MCH 31.9 10/28/2018 1347   MCHC 32.5 12/09/2018 1535   RDW 18.1 (H) 12/09/2018 1535   RDW 17.0 (H) 11/19/2018 1640   LYMPHSABS 1.2 10/28/2018 1347   MONOABS 0.5 10/28/2018 1347   EOSABS 0.1 10/28/2018 1347   BASOSABS 0.0 10/28/2018 1347    Chest Imaging: 11/12/2018: CT chest Interval growth of a right upper lobe 3.1 cm right upper lobe lung mass concerning for bronchogenic carcinoma with interval progression of the right paratracheal lymphadenopathy concerning for metastatic disease. The patient's images have been independently reviewed by me.    Pulmonary Functions Testing Results: No flowsheet data found.  FeNO: None   Pathology: None   Echocardiogram: None   Heart Catheterization: None     Assessment & Plan:     ICD-10-CM   1. Mass of upper lobe of right lung  R91.8 Ambulatory referral to Pulmonology  2. Mediastinal adenopathy  R59.0 Ambulatory referral to Pulmonology  3. Hilar adenopathy  R59.0 Ambulatory referral to Pulmonology    Discussion:  This is a 68 year old female with a CT scan concerning for a right upper lobe lung mass and mediastinal and hilar adenopathy concerning for advanced age bronchogenic carcinoma.  Plan: Today in the office we discussed the risk benefits and alternatives of proceeding with bronchoscopy tissue sampling and staging of her lung cancer.  Today in the office we discussed in detail the procedure of endobronchial ultrasound with video bronchoscopy.  We talked about the need of sampling the lymph nodes that are present within the mediastinum.  We discussed the risk benefits and alternatives of  proceeding with this procedure.  She understands the risk of potential bleeding from a biopsy.  There is also very low risk of bronchoscopy with endobronchial ultrasound to cause a pneumothorax however it is possible.  We discussed this today in the office.  Patient is agreeable to this procedure.  She currently takes a 325 mg aspirin we have asked her to stop this.  She can drop down to an 81 mg daily aspirin.  Between now and the procedure and can continue this.  Patient is currently ESRD on dialysis Monday Wednesday Friday.  We will plan to have the patient's procedure to complete on next Thursday did not interfere with her dialysis schedule.  Orders placed to schedule procedure as well as preoperative Covid testing for next week.  Greater than 50% of this patient's 60-minute office visit was spent face-to-face discussing above recommendations and treatment plan.     Current Outpatient Medications:  .  acetaminophen (TYLENOL) 325 MG tablet, Take 325-650 mg by mouth every 8 (eight) hours as needed for mild pain. , Disp: , Rfl:  .  allopurinol (ZYLOPRIM) 100 MG tablet, Take 100 mg by mouth daily.  , Disp: , Rfl:  .  Amino Acids-Protein Hydrolys (FEEDING SUPPLEMENT, PRO-STAT SUGAR FREE 64,) LIQD, Take 30 mLs by mouth 2 (two) times daily., Disp: 887 mL, Rfl: 0 .  aspirin EC 325 MG tablet, Take 1 tablet (325 mg total) by mouth daily., Disp: 30 tablet, Rfl: 0 .  atenolol (TENORMIN) 100 MG tablet, Take by mouth., Disp: , Rfl:  .  atorvastatin (LIPITOR) 10 MG tablet, Take 10 mg by mouth at bedtime. , Disp: , Rfl: 0 .  B Complex-Folic Acid (B COMPLEX FORMULA 1) TABS, Take by mouth., Disp: , Rfl:  .  calcitRIOL (ROCALTROL) 0.5 MCG capsule, Take 2 capsules (1 mcg total) by mouth every Monday, Wednesday, and Friday with hemodialysis., Disp: 30 capsule, Rfl: 0 .  cinacalcet (SENSIPAR) 30 MG tablet, Take 1 tablet (30 mg total) by mouth daily with supper., Disp: 60 tablet, Rfl: 0 .  colchicine 0.6 MG  tablet, Take 0.6 mg by mouth daily as needed (as directed for gout flares or pain). , Disp: , Rfl:  .  Darbepoetin Alfa (ARANESP) 60 MCG/0.3ML SOSY injection, Inject 0.3 mLs (60 mcg total) into the vein every Friday with hemodialysis., Disp: 4.2 mL, Rfl:  .  heparin 1000 UNIT/ML injection, Heparin Sodium (Porcine) 1,000 Units/mL Systemic, Disp: , Rfl:  .  hydrocerin (EUCERIN) CREA, Apply 1 application topically 2 (two) times daily., Disp: 228 g, Rfl: 0 .  loperamide (IMODIUM) 2 MG capsule, Take by mouth., Disp: , Rfl:  .  midodrine (PROAMATINE) 10 MG tablet, Take 1 tablet (10 mg total) by mouth 2 (two) times daily with a meal., Disp: 60 tablet, Rfl: 0 .  multivitamin (RENA-VIT) TABS tablet, Take 1 tablet by mouth at bedtime., Disp: 30 tablet, Rfl: 0 .  nicotine (NICODERM CQ - DOSED IN MG/24 HOURS) 14 mg/24hr patch, Place 1 patch (14 mg total) onto the skin daily as needed (smoking craving)., Disp: 28 patch, Rfl: 0 .  thiamine (VITAMIN B-1) 100 MG tablet, Take 100 mg by mouth daily., Disp: , Rfl:  .  VELPHORO 500 MG chewable tablet, Chew 2 tablets by mouth 3 (three) times daily with meals. , Disp: , Rfl:  .  VITAMIN D, CHOLECALCIFEROL, PO, Take by mouth., Disp: , Rfl:    Garner Nash, DO [facility] 12/13/2018 3:33 PM

## 2018-12-11 NOTE — Progress Notes (Signed)
Synopsis: Referred in October 2020 for mediastinal adenopathy, lung mass by [name], FNP  Subjective:   PATIENT ID: Jessica Clarke GENDER: female DOB: [birth year], MRN: [id number]  Chief Complaint  Patient presents with  . Consult    Abnormal CT. CT 9.29. She reports having increased SOB and chest pain for months. Diarrhea started last night. She saw GI monday. Dialysis MWF.     68 year old female with past medical history of tobacco abuse, history of hyperlipidemia, hypertension, chronic kidney disease, gastroesophageal reflux disease.  Patient was found to have abnormal chest imaging in November 12, 2018.  She was found to have a large lung mass as well as mediastinal adenopathy concerning for an advanced stage lung cancer.  Patient was referred to Dr. Lew Dawes office at medical oncology who has referred the patient for [place] for evaluation of bronchoscopy.  Patient here today with no significant symptoms except fatigue and weight loss.  She is unfortunately still smoking.  We talked about this today in the office.  Patient denies hemoptysis.  She overall is doing okay.  Her daughter is present with her today in the office.      Past Medical History:  Diagnosis Date  . Abnormal Pap smear    ASCUS ?HGSIL  . Anemia   . Cervical dysplasia   . CHF (congestive heart failure) ([place])   . Chronic kidney disease   . Constipation   . Constipation   . GERD (gastroesophageal reflux disease)   . Gout   . Hyperlipidemia   . Hypertension   . Myocardial infarction ([place]) 2015  . Obesity   . Pneumonia   . Uterine polyp      Family History  Problem Relation Age of Onset  . Esophageal cancer Other   . Pancreatic cancer Other   . Heart disease Mother   . Hypertension Mother   . Breast cancer Sister   . Esophageal cancer Sister   . Cancer Sister   . Deep vein thrombosis Sister   . Diabetes Sister   . Hypertension Sister   . Hypertension Daughter   . Colon cancer Neg Hx      Past  Surgical History:  Procedure Laterality Date  . AV FISTULA PLACEMENT  01/18/2012   Procedure: ARTERIOVENOUS (AV) FISTULA CREATION;  Surgeon: Angelia Mould, MD;  Location: [facility];  Service: Vascular;  Laterality: Left;  . BIOPSY  10/20/2018   Procedure: BIOPSY;  Surgeon: Thornton Park, MD;  Location: [facility];  Service: Gastroenterology;;  . COLONOSCOPY WITH PROPOFOL N/A 10/20/2018   Procedure: COLONOSCOPY WITH PROPOFOL;  Surgeon: Thornton Park, MD;  Location: [facility];  Service: Gastroenterology;  Laterality: N/A;  . DILATION AND CURETTAGE OF UTERUS    . HYSTEROSCOPY    . LEEP    . LEFT HEART CATHETERIZATION WITH CORONARY ANGIOGRAM N/A 08/26/2013   Procedure: LEFT HEART CATHETERIZATION WITH CORONARY ANGIOGRAM;  Surgeon: Leonie Man, MD;  Location: [facility] CATH LAB;  Service: Cardiovascular;  Laterality: N/A;  . POLYPECTOMY  10/20/2018   Procedure: POLYPECTOMY;  Surgeon: Thornton Park, MD;  Location: [facility];  Service: Gastroenterology;;  . REVISON OF ARTERIOVENOUS FISTULA Left 05/22/8117   Procedure: PLICATION OF ARTERIOVENOUS FISTULA;  Surgeon: Conrad Vineyard, MD;  Location: [place];  Service: Vascular;  Laterality: Left;    Social History   Socioeconomic History  . Marital status: Married    Spouse name: Not on file  . Number of children: 1  . Years of education: Not on file  .  Highest education level: Not on file  Occupational History    Employer: UNEMPLOYED    Comment: Unemployed   Social Needs  . Financial resource strain: Not on file  . Food insecurity    Worry: Not on file    Inability: Not on file  . Transportation needs    Medical: Not on file    Non-medical: Not on file  Tobacco Use  . Smoking status: Current Every Day Smoker    Packs/day: 0.50    Years: 30.00    Pack years: 15.00    Types: Cigarettes  . Smokeless tobacco: Never Used  Substance and Sexual Activity  . Alcohol use: Yes    Alcohol/week: 6.0 standard drinks    Types: 6 Cans of  beer per week  . Drug use: No  . Sexual activity: Never    Birth control/protection: Post-menopausal    Comment: Did not ask  Lifestyle  . Physical activity    Days per week: Not on file    Minutes per session: Not on file  . Stress: Not on file  Relationships  . Social Herbalist on phone: Not on file    Gets together: Not on file    Attends religious service: Not on file    Active member of club or organization: Not on file    Attends meetings of clubs or organizations: Not on file    Relationship status: Not on file  . Intimate partner violence    Fear of current or ex partner: Not on file    Emotionally abused: Not on file    Physically abused: Not on file    Forced sexual activity: Not on file  Other Topics Concern  . Not on file  Social History Narrative  . Not on file     No Known Allergies   Outpatient Medications Prior to Visit  Medication Sig Dispense Refill  . acetaminophen (TYLENOL) 325 MG tablet Take 325-650 mg by mouth every 8 (eight) hours as needed for mild pain.     Marland Kitchen allopurinol (ZYLOPRIM) 100 MG tablet Take 100 mg by mouth daily.      . Amino Acids-Protein Hydrolys (FEEDING SUPPLEMENT, PRO-STAT SUGAR FREE 64,) LIQD Take 30 mLs by mouth 2 (two) times daily. 887 mL 0  . aspirin EC 325 MG tablet Take 1 tablet (325 mg total) by mouth daily. 30 tablet 0  . atenolol (TENORMIN) 100 MG tablet Take by mouth.    Marland Kitchen atorvastatin (LIPITOR) 10 MG tablet Take 10 mg by mouth at bedtime.   0  . B Complex-Folic Acid (B COMPLEX FORMULA 1) TABS Take by mouth.    . calcitRIOL (ROCALTROL) 0.5 MCG capsule Take 2 capsules (1 mcg total) by mouth every Monday, Wednesday, and Friday with hemodialysis. 30 capsule 0  . cinacalcet (SENSIPAR) 30 MG tablet Take 1 tablet (30 mg total) by mouth daily with supper. 60 tablet 0  . colchicine 0.6 MG tablet Take 0.6 mg by mouth daily as needed (as directed for gout flares or pain).     . Darbepoetin Alfa (ARANESP) 60 MCG/0.3ML SOSY  injection Inject 0.3 mLs (60 mcg total) into the vein every Friday with hemodialysis. 4.2 mL   . heparin 1000 UNIT/ML injection Heparin Sodium (Porcine) 1,000 Units/mL Systemic    . hydrocerin (EUCERIN) CREA Apply 1 application topically 2 (two) times daily. 228 g 0  . loperamide (IMODIUM) 2 MG capsule Take by mouth.    . midodrine (PROAMATINE) 10  MG tablet Take 1 tablet (10 mg total) by mouth 2 (two) times daily with a meal. 60 tablet 0  . multivitamin (RENA-VIT) TABS tablet Take 1 tablet by mouth at bedtime. 30 tablet 0  . nicotine (NICODERM CQ - DOSED IN MG/24 HOURS) 14 mg/24hr patch Place 1 patch (14 mg total) onto the skin daily as needed (smoking craving). 28 patch 0  . thiamine (VITAMIN B-1) 100 MG tablet Take 100 mg by mouth daily.    . VELPHORO 500 MG chewable tablet Chew 2 tablets by mouth 3 (three) times daily with meals.     Marland Kitchen VITAMIN D, CHOLECALCIFEROL, PO Take by mouth.    . Multiple Vitamin (MULTIVITAMIN) capsule Take by mouth.     No facility-administered medications prior to visit.     Review of Systems  Constitutional: Positive for malaise/fatigue and weight loss. Negative for chills and fever.  HENT: Negative for hearing loss, sore throat and tinnitus.   Eyes: Negative for blurred vision and double vision.  Respiratory: Positive for cough and shortness of breath. Negative for hemoptysis, sputum production, wheezing and stridor.   Cardiovascular: Negative for chest pain, palpitations, orthopnea, leg swelling and PND.  Gastrointestinal: Negative for abdominal pain, constipation, diarrhea, heartburn, nausea and vomiting.  Genitourinary: Negative for dysuria, hematuria and urgency.  Musculoskeletal: Negative for joint pain and myalgias.  Skin: Negative for itching and rash.  Neurological: Negative for dizziness, tingling, weakness and headaches.  Endo/Heme/Allergies: Negative for environmental allergies. Does not bruise/bleed easily.  Psychiatric/Behavioral: Negative for  depression. The patient is not nervous/anxious and does not have insomnia.   All other systems reviewed and are negative.    Objective:  Physical Exam Vitals signs reviewed.  Constitutional:      General: She is not in acute distress.    Appearance: She is well-developed.  HENT:     Head: Normocephalic and atraumatic.     Mouth/Throat:     Pharynx: No oropharyngeal exudate.  Eyes:     Conjunctiva/sclera: Conjunctivae normal.     Pupils: Pupils are equal, round, and reactive to light.  Neck:     Vascular: No JVD.     Trachea: No tracheal deviation.     Comments: Loss of supraclavicular fat Cardiovascular:     Rate and Rhythm: Normal rate and regular rhythm.     Heart sounds: S1 normal and S2 normal.     Comments: Distant heart tones Pulmonary:     Effort: No tachypnea or accessory muscle usage.     Breath sounds: No stridor. Decreased breath sounds (throughout all lung fields) present. No wheezing, rhonchi or rales.  Abdominal:     General: Bowel sounds are normal. There is no distension.     Palpations: Abdomen is soft.     Tenderness: There is no abdominal tenderness.  Musculoskeletal:        General: Deformity (muscle wasting ) present.  Skin:    General: Skin is warm and dry.     Capillary Refill: Capillary refill takes less than 2 seconds.     Findings: No rash.  Neurological:     Mental Status: She is alert and oriented to person, place, and time.  Psychiatric:        Behavior: Behavior normal.      Vitals:   12/12/18 1131  BP: 110/72  Pulse: 71  Temp: 98 F (36.7 C)  TempSrc: Temporal  SpO2: 95%  Weight: 149 lb 3.2 oz (67.7 kg)  Height: 5\' 10"  (1.778 m)  95% on RA BMI Readings from Last 3 Encounters:  12/12/18 21.41 kg/m  12/09/18 23.87 kg/m  11/21/18 22.92 kg/m   Wt Readings from Last 3 Encounters:  12/12/18 149 lb 3.2 oz (67.7 kg)  12/09/18 157 lb (71.2 kg)  11/21/18 154 lb 4.8 oz (70 kg)     CBC    Component Value Date/Time   WBC  4.6 12/09/2018 1535   RBC 2.72 (L) 12/09/2018 1535   HGB 9.2 Repeated and verified X2. (L) 12/09/2018 1535   HGB 7.5 (L) 11/19/2018 1640   HCT 27.9 (L) 12/09/2018 1535   HCT 23.0 (L) 11/19/2018 1640   PLT 197.0 12/09/2018 1535   PLT 216 11/19/2018 1640   MCV 102.4 (H) 12/09/2018 1535   MCV 100 (H) 11/19/2018 1640   MCH 32.6 11/19/2018 1640   MCH 31.9 10/28/2018 1347   MCHC 32.5 12/09/2018 1535   RDW 18.1 (H) 12/09/2018 1535   RDW 17.0 (H) 11/19/2018 1640   LYMPHSABS 1.2 10/28/2018 1347   MONOABS 0.5 10/28/2018 1347   EOSABS 0.1 10/28/2018 1347   BASOSABS 0.0 10/28/2018 1347    Chest Imaging: 11/12/2018: CT chest Interval growth of a right upper lobe 3.1 cm right upper lobe lung mass concerning for bronchogenic carcinoma with interval progression of the right paratracheal lymphadenopathy concerning for metastatic disease. The patient's images have been independently reviewed by me.    Pulmonary Functions Testing Results: No flowsheet data found.  FeNO: None   Pathology: None   Echocardiogram: None   Heart Catheterization: None     Assessment & Plan:     ICD-10-CM   1. Mass of upper lobe of right lung  R91.8 Ambulatory referral to Pulmonology  2. Mediastinal adenopathy  R59.0 Ambulatory referral to Pulmonology  3. Hilar adenopathy  R59.0 Ambulatory referral to Pulmonology    Discussion:  This is a 69 year old female with a CT scan concerning for a right upper lobe lung mass and mediastinal and hilar adenopathy concerning for advanced age bronchogenic carcinoma.  Plan: Today in the office we discussed the risk benefits and alternatives of proceeding with bronchoscopy tissue sampling and staging of her lung cancer.  Today in the office we discussed in detail the procedure of endobronchial ultrasound with video bronchoscopy.  We talked about the need of sampling the lymph nodes that are present within the mediastinum.  We discussed the risk benefits and alternatives of  proceeding with this procedure.  She understands the risk of potential bleeding from a biopsy.  There is also very low risk of bronchoscopy with endobronchial ultrasound to cause a pneumothorax however it is possible.  We discussed this today in the office.  Patient is agreeable to this procedure.  She currently takes a 325 mg aspirin we have asked her to stop this.  She can drop down to an 81 mg daily aspirin.  Between now and the procedure and can continue this.  Patient is currently ESRD on dialysis Monday Wednesday Friday.  We will plan to have the patient's procedure to complete on next Thursday did not interfere with her dialysis schedule.  Orders placed to schedule procedure as well as preoperative Covid testing for next week.  Greater than 50% of this patient's 60-minute office visit was spent face-to-face discussing above recommendations and treatment plan.     Current Outpatient Medications:  .  acetaminophen (TYLENOL) 325 MG tablet, Take 325-650 mg by mouth every 8 (eight) hours as needed for mild pain. , Disp: , Rfl:  .  allopurinol (ZYLOPRIM) 100 MG tablet, Take 100 mg by mouth daily.  , Disp: , Rfl:  .  Amino Acids-Protein Hydrolys (FEEDING SUPPLEMENT, PRO-STAT SUGAR FREE 64,) LIQD, Take 30 mLs by mouth 2 (two) times daily., Disp: 887 mL, Rfl: 0 .  aspirin EC 325 MG tablet, Take 1 tablet (325 mg total) by mouth daily., Disp: 30 tablet, Rfl: 0 .  atenolol (TENORMIN) 100 MG tablet, Take by mouth., Disp: , Rfl:  .  atorvastatin (LIPITOR) 10 MG tablet, Take 10 mg by mouth at bedtime. , Disp: , Rfl: 0 .  B Complex-Folic Acid (B COMPLEX FORMULA 1) TABS, Take by mouth., Disp: , Rfl:  .  calcitRIOL (ROCALTROL) 0.5 MCG capsule, Take 2 capsules (1 mcg total) by mouth every Monday, Wednesday, and Friday with hemodialysis., Disp: 30 capsule, Rfl: 0 .  cinacalcet (SENSIPAR) 30 MG tablet, Take 1 tablet (30 mg total) by mouth daily with supper., Disp: 60 tablet, Rfl: 0 .  colchicine 0.6 MG  tablet, Take 0.6 mg by mouth daily as needed (as directed for gout flares or pain). , Disp: , Rfl:  .  Darbepoetin Alfa (ARANESP) 60 MCG/0.3ML SOSY injection, Inject 0.3 mLs (60 mcg total) into the vein every Friday with hemodialysis., Disp: 4.2 mL, Rfl:  .  heparin 1000 UNIT/ML injection, Heparin Sodium (Porcine) 1,000 Units/mL Systemic, Disp: , Rfl:  .  hydrocerin (EUCERIN) CREA, Apply 1 application topically 2 (two) times daily., Disp: 228 g, Rfl: 0 .  loperamide (IMODIUM) 2 MG capsule, Take by mouth., Disp: , Rfl:  .  midodrine (PROAMATINE) 10 MG tablet, Take 1 tablet (10 mg total) by mouth 2 (two) times daily with a meal., Disp: 60 tablet, Rfl: 0 .  multivitamin (RENA-VIT) TABS tablet, Take 1 tablet by mouth at bedtime., Disp: 30 tablet, Rfl: 0 .  nicotine (NICODERM CQ - DOSED IN MG/24 HOURS) 14 mg/24hr patch, Place 1 patch (14 mg total) onto the skin daily as needed (smoking craving)., Disp: 28 patch, Rfl: 0 .  thiamine (VITAMIN B-1) 100 MG tablet, Take 100 mg by mouth daily., Disp: , Rfl:  .  VELPHORO 500 MG chewable tablet, Chew 2 tablets by mouth 3 (three) times daily with meals. , Disp: , Rfl:  .  VITAMIN D, CHOLECALCIFEROL, PO, Take by mouth., Disp: , Rfl:    Garner Nash, DO [facility] 12/13/2018 3:33 PM

## 2018-12-11 NOTE — Progress Notes (Signed)
COMMUNITY PALLIATIVE CARE RN NOTE  PATIENT NAME: Jessica Clarke DOB: [birth year] MRN: [id number]  PRIMARY CARE PROVIDER: [name], FNP  RESPONSIBLE PARTY:  Acct ID - Guarantor Home Phone Work Phone Relationship Acct Type  [id number] Jessica [id number]  Self P/F     [address], [place], [place] [address]   Covid-19 Pre-screening Negative  PLAN OF CARE and INTERVENTION:  1. ADVANCE CARE PLANNING/GOALS OF CARE: Goal is to get stronger and hopefully return to her own home. 2. PATIENT/CAREGIVER EDUCATION: Disease Management 3. DISEASE STATUS: Met with patient outside on the porch. She reports that she has been sitting outside for about 2 hours. She enjoys outside and says it helps keep her warm. She denies pain, but says that she feels "wiped out" today. During the night, she woke up d/t her feet being very cold. It took her some time for her feet to warm up and she eventually fell back asleep. She says that ever since she woke up she has felt more tired than usual. PT/OT both visited with her earlier today. She was able to perform some exercises but says it was difficult, but she did what she could. She has another therapy session on 12/12/18. She had an appointment  with her GI doctor yesterday and says that her Hgb is low, but unsure of the number. According to records, Hgb is 9.2. She has to give a stool sample when able. She has been experiencing ongoing diarrhea. It was recommended that she start a bulking agent such as Metamucil. Her appetite is fair. She does drink Glucerna for additional nutritional supplementation. She denies dysphagia. She reports not going to dialysis yesterday as scheduled d/t her GI appointment, but will go tomorrow. She continues to have dialysis on Mondays, Wednesdays, Fridays. She remains ambulatory and able to give herself a sponge bath. She requires minimal assistance with dressing. Provided active listening and support as she spoke of her difficult  journey with this illness. She spoke of the "spots" that have been found on her lung and says she is awaiting a biopsy. She is appreciative of visit. Will continue to monitor.   HISTORY OF PRESENT ILLNESS: This is a 68 yo female who currently resides with her daughter, Jessica Clarke. Palliative care team continues to follow patient. Will continue to visit monthly and PRN.   CODE STATUS: Full Code ADVANCED DIRECTIVES: N MOST FORM: no PPS: 50%   PHYSICAL EXAM:    LUNGS: clear to auscultation  CARDIAC: Cor RRR EXTREMITIES: Edema to bilateral lower extremities SKIN: Dark skin, no open areas   NEURO: Alert and oriented x 3, HOH, generalized weakness, ambulatory   (Duration of visit and documentation 45 minutes)   Jessica Eastern, RN BSN

## 2018-12-12 ENCOUNTER — Telehealth: Payer: Self-pay

## 2018-12-12 ENCOUNTER — Other Ambulatory Visit: Payer: Self-pay

## 2018-12-12 ENCOUNTER — Ambulatory Visit (INDEPENDENT_AMBULATORY_CARE_PROVIDER_SITE_OTHER): Payer: Medicare Other | Admitting: Pulmonary Disease

## 2018-12-12 ENCOUNTER — Encounter: Payer: Self-pay | Admitting: Pulmonary Disease

## 2018-12-12 VITALS — BP 110/72 | HR 71 | Temp 98.0°F | Ht 70.0 in | Wt 149.2 lb

## 2018-12-12 DIAGNOSIS — R918 Other nonspecific abnormal finding of lung field: Secondary | ICD-10-CM | POA: Diagnosis not present

## 2018-12-12 DIAGNOSIS — R590 Localized enlarged lymph nodes: Secondary | ICD-10-CM | POA: Diagnosis not present

## 2018-12-12 NOTE — Telephone Encounter (Signed)
Call made to patient daughter [name] (dpr, emergency contact), made aware of the below:  [facility] 11/5 at 10:30am at [facility]. Arrive by 9:30am. Go to central entrance and they will take her to admitting from there. Aware patient is to stop her Aspirin. NPO after midnight and only take medications with a sip of water.   Covid Test is 11/2 at 10:30am at [place] location.   Voiced understanding. Nothing further needed at this time. Aware I will call next week to confirm all this info as well.

## 2018-12-12 NOTE — Patient Instructions (Addendum)
Thank you for visiting Dr. Valeta Harms at [facility]. Today we recommend the following:  Orders Placed This Encounter  Procedures  . Ambulatory referral to Pulmonology   Please stop the use of 325 mg aspirin until after your procedure.  Return in about 4 weeks (around 01/09/2019).    Please do your part to reduce the spread of COVID-19.

## 2018-12-13 DIAGNOSIS — D631 Anemia in chronic kidney disease: Secondary | ICD-10-CM | POA: Diagnosis not present

## 2018-12-13 DIAGNOSIS — N2581 Secondary hyperparathyroidism of renal origin: Secondary | ICD-10-CM | POA: Diagnosis not present

## 2018-12-13 DIAGNOSIS — N186 End stage renal disease: Secondary | ICD-10-CM | POA: Diagnosis not present

## 2018-12-13 DIAGNOSIS — Z992 Dependence on renal dialysis: Secondary | ICD-10-CM | POA: Diagnosis not present

## 2018-12-14 DIAGNOSIS — I132 Hypertensive heart and chronic kidney disease with heart failure and with stage 5 chronic kidney disease, or end stage renal disease: Secondary | ICD-10-CM | POA: Diagnosis not present

## 2018-12-14 DIAGNOSIS — A040 Enteropathogenic Escherichia coli infection: Secondary | ICD-10-CM | POA: Diagnosis not present

## 2018-12-14 DIAGNOSIS — K921 Melena: Secondary | ICD-10-CM | POA: Diagnosis not present

## 2018-12-14 DIAGNOSIS — I5030 Unspecified diastolic (congestive) heart failure: Secondary | ICD-10-CM | POA: Diagnosis not present

## 2018-12-14 DIAGNOSIS — D500 Iron deficiency anemia secondary to blood loss (chronic): Secondary | ICD-10-CM | POA: Diagnosis not present

## 2018-12-14 DIAGNOSIS — N186 End stage renal disease: Secondary | ICD-10-CM | POA: Diagnosis not present

## 2018-12-15 DIAGNOSIS — N186 End stage renal disease: Secondary | ICD-10-CM | POA: Diagnosis not present

## 2018-12-15 DIAGNOSIS — N2889 Other specified disorders of kidney and ureter: Secondary | ICD-10-CM | POA: Diagnosis not present

## 2018-12-15 DIAGNOSIS — Z992 Dependence on renal dialysis: Secondary | ICD-10-CM | POA: Diagnosis not present

## 2018-12-16 ENCOUNTER — Ambulatory Visit: Payer: Medicare Other

## 2018-12-16 ENCOUNTER — Other Ambulatory Visit (HOSPITAL_COMMUNITY)
Admission: RE | Admit: 2018-12-16 | Discharge: 2018-12-16 | Disposition: A | Discharge status: Home / Self Care | Payer: Medicare Other | Source: Ambulatory Visit | Attending: Pulmonary Disease | Admitting: Pulmonary Disease

## 2018-12-16 ENCOUNTER — Other Ambulatory Visit: Payer: Medicare Other

## 2018-12-16 DIAGNOSIS — A09 Infectious gastroenteritis and colitis, unspecified: Secondary | ICD-10-CM

## 2018-12-16 DIAGNOSIS — Z992 Dependence on renal dialysis: Secondary | ICD-10-CM | POA: Diagnosis not present

## 2018-12-16 DIAGNOSIS — Z01812 Encounter for preprocedural laboratory examination: Secondary | ICD-10-CM | POA: Insufficient documentation

## 2018-12-16 DIAGNOSIS — N186 End stage renal disease: Secondary | ICD-10-CM | POA: Diagnosis not present

## 2018-12-16 DIAGNOSIS — Z20828 Contact with and (suspected) exposure to other viral communicable diseases: Secondary | ICD-10-CM | POA: Diagnosis not present

## 2018-12-16 DIAGNOSIS — K922 Gastrointestinal hemorrhage, unspecified: Secondary | ICD-10-CM | POA: Diagnosis not present

## 2018-12-16 DIAGNOSIS — N2581 Secondary hyperparathyroidism of renal origin: Secondary | ICD-10-CM | POA: Diagnosis not present

## 2018-12-16 DIAGNOSIS — D631 Anemia in chronic kidney disease: Secondary | ICD-10-CM | POA: Diagnosis not present

## 2018-12-16 DIAGNOSIS — R918 Other nonspecific abnormal finding of lung field: Secondary | ICD-10-CM

## 2018-12-16 NOTE — Chronic Care Management (AMB) (Signed)
Chronic Care Management     Social Work Follow Up Note  12/16/2018 Name: Jessica Clarke MRN: [id number] DOB: 11/25/1950  Jessica Clarke is a 68 y.o. year old female who is a primary care patient of [name], [place]. The CCM team was consulted for assistance with care coordination.   Review of patient status, including review of consultants reports, other relevant assessments, and collaboration with appropriate care team members and the patient's provider was performed as part of comprehensive patient evaluation and provision of chronic care management services.    SDOH (Social Determinants of Health) screening performed today: None. The patients daughter does identify desire to obtain a caregiver in the home. See Care Plan for related entries.    Outpatient Encounter Medications as of 12/16/2018  Medication Sig Note  . acetaminophen (TYLENOL) 325 MG tablet Take 325-650 mg by mouth every 8 (eight) hours as needed for mild pain.    Marland Kitchen allopurinol (ZYLOPRIM) 100 MG tablet Take 100 mg by mouth daily.     . Amino Acids-Protein Hydrolys (FEEDING SUPPLEMENT, PRO-STAT SUGAR FREE 64,) LIQD Take 30 mLs by mouth 2 (two) times daily.   Marland Kitchen aspirin EC 325 MG tablet Take 1 tablet (325 mg total) by mouth daily. 10/17/2018: Regimen confirmed to be accurate by the patient  . atenolol (TENORMIN) 100 MG tablet Take by mouth.   Marland Kitchen atorvastatin (LIPITOR) 10 MG tablet Take 10 mg by mouth at bedtime.    . B Complex-Folic Acid (B COMPLEX FORMULA 1) TABS Take by mouth.   . calcitRIOL (ROCALTROL) 0.5 MCG capsule Take 2 capsules (1 mcg total) by mouth every Monday, Wednesday, and Friday with hemodialysis.   Marland Kitchen cinacalcet (SENSIPAR) 30 MG tablet Take 1 tablet (30 mg total) by mouth daily with supper.   . colchicine 0.6 MG tablet Take 0.6 mg by mouth daily as needed (as directed for gout flares or pain).    . Darbepoetin Alfa (ARANESP) 60 MCG/0.3ML SOSY injection Inject 0.3 mLs (60 mcg total) into the vein every Friday with  hemodialysis.   . heparin 1000 UNIT/ML injection Heparin Sodium (Porcine) 1,000 Units/mL Systemic   . hydrocerin (EUCERIN) CREA Apply 1 application topically 2 (two) times daily.   Marland Kitchen loperamide (IMODIUM) 2 MG capsule Take by mouth.   . midodrine (PROAMATINE) 10 MG tablet Take 1 tablet (10 mg total) by mouth 2 (two) times daily with a meal.   . multivitamin (RENA-VIT) TABS tablet Take 1 tablet by mouth at bedtime.   . nicotine (NICODERM CQ - DOSED IN MG/24 HOURS) 14 mg/24hr patch Place 1 patch (14 mg total) onto the skin daily as needed (smoking craving).   . thiamine (VITAMIN B-1) 100 MG tablet Take 100 mg by mouth daily.   . VELPHORO 500 MG chewable tablet Chew 2 tablets by mouth 3 (three) times daily with meals.    Marland Kitchen VITAMIN D, CHOLECALCIFEROL, PO Take by mouth.    No facility-administered encounter medications on file as of 12/16/2018.      Goals Addressed            This Visit's Progress   . "I would like my mom to have a CNA in her home"       Daughter stated:  Current Barriers:  . Limited knowledge of health plan benefits  Social Work Clinical Goal(s):  Marland Kitchen Over the next 20 days, the patient will work with SW to determined health plan benefits. . Over the next 45 days the patient and her daughter will  become more knowledgeable of community resources available to assist with care needs  CCM SW Interventions: Completed 12/16/2018 with daughter [name] . Outbound call placed to [name] to assist with care coordination in regards to patient care needs . Confirmed patient is covered by both Medicare and Medicaid . Discussed opportunity to apply for PCS benefit under the patients Medicaid plan . Thompson Caul SW would communicate with [facility] to confirm PCS benefit prior to initiating application  Patient Self Care Activities:  . Self administers medications as prescribed . Attends all scheduled provider appointments . Calls provider office for new concerns or questions   Initial goal documentation     . COMPLETED: Assist with chronic care management enrollment and conduction of SDOH screen       Current Barriers:  Marland Kitchen Knowledge Barriers related to resources and support available to address needs related to Chronic Care Management and challenges surrounding Social Determinants of Health  Clinical Social Work Clinical Goal(s):   Over the next 14 days the patienand her daughter will follow up with the [place] as directed by SW. Completed  Over the next 20 days, the patient will understand the role of the CCM team and work with SW to complete SDOH (Social Determinants of Health) screen.  Over the next 5 days the patient will speak with a member of her primary care team regarding recent lab results  CCM SW Interventions: Completed 12/16/2018 with patient and daughter [name] call placed to the patient's daughter [name] to conduct SDOH screen  Discussed patients plan to move into her own place "this week" as she was only living with [name] temporarily since recent hospitalization  Assessed for patient care needs   Identified [name]'s desire to obtain a caregiver to assist with the patients ADL needs in the home - see alternate goal  Patient Self Care Activities:   Patient currently unable to independently manage chronic conditions   Please see past updates related to this goal.        Follow Up Plan: SW will follow up with the patient and her daughter upon PCS benefit determination.   Daneen Schick, BSW, CDP Social Worker, Certified Dementia Practitioner [place] / [place] Management [phone]  Total time spent performing care coordination and/or care management activities with the patient by phone or face to face = 9 minutes.

## 2018-12-16 NOTE — Patient Instructions (Signed)
Social Worker Visit Information  Goals we discussed today:  Goals Addressed            This Visit's Progress   . "I would like my mom to have a CNA in her home"       Daughter stated:  Current Barriers:  . Limited knowledge of health plan benefits  Social Work Clinical Goal(s):  Marland Kitchen Over the next 20 days, the patient will work with SW to determined health plan benefits. . Over the next 45 days the patient and her daughter will become more knowledgeable of community resources available to assist with care needs  CCM SW Interventions: Completed 12/16/2018 with daughter [name] . Outbound call placed to [name] to assist with care coordination in regards to patient care needs . Confirmed patient is covered by both Medicare and Medicaid . Discussed opportunity to apply for PCS benefit under the patients Medicaid plan . Thompson Caul SW would communicate with [facility] to confirm PCS benefit prior to initiating application  Patient Self Care Activities:  . Self administers medications as prescribed . Attends all scheduled provider appointments . Calls provider office for new concerns or questions  Initial goal documentation     . COMPLETED: Assist with chronic care management enrollment and conduction of SDOH screen       Current Barriers:  Marland Kitchen Knowledge Barriers related to resources and support available to address needs related to Chronic Care Management and challenges surrounding Social Determinants of Health  Clinical Social Work Clinical Goal(s):   Over the next 14 days the patienand her daughter will follow up with the [place] as directed by SW. Completed  Over the next 20 days, the patient will understand the role of the CCM team and work with SW to complete SDOH (Social Determinants of Health) screen.  Over the next 5 days the patient will speak with a member of her primary care team regarding recent lab results  CCM SW Interventions: Completed 12/16/2018  with patient and daughter [name] call placed to the patient's daughter [name] to conduct SDOH screen  Discussed patients plan to move into her own place "this week" as she was only living with [name] temporarily since recent hospitalization  Assessed for patient care needs   Identified [name]'s desire to obtain a caregiver to assist with the patients ADL needs in the home - see alternate goal  Patient Self Care Activities:   Patient currently unable to independently manage chronic conditions   Please see past updates related to this goal.        Materials Provided: Verbal education about PCS benefit provided by phone  Follow Up Plan: SW will follow up with the patient and her daughter upon determining PCS benefit coverage.   Daneen Schick, BSW, CDP Social Worker, Certified Dementia Practitioner [place] / [place] Management [phone]

## 2018-12-17 ENCOUNTER — Ambulatory Visit: Payer: Self-pay

## 2018-12-17 ENCOUNTER — Other Ambulatory Visit: Payer: Self-pay

## 2018-12-17 ENCOUNTER — Telehealth: Payer: Self-pay

## 2018-12-17 ENCOUNTER — Encounter (HOSPITAL_COMMUNITY): Payer: Self-pay | Admitting: *Deleted

## 2018-12-17 DIAGNOSIS — I10 Essential (primary) hypertension: Secondary | ICD-10-CM

## 2018-12-17 DIAGNOSIS — K921 Melena: Secondary | ICD-10-CM | POA: Diagnosis not present

## 2018-12-17 DIAGNOSIS — N186 End stage renal disease: Secondary | ICD-10-CM | POA: Diagnosis not present

## 2018-12-17 DIAGNOSIS — A040 Enteropathogenic Escherichia coli infection: Secondary | ICD-10-CM | POA: Diagnosis not present

## 2018-12-17 DIAGNOSIS — I5030 Unspecified diastolic (congestive) heart failure: Secondary | ICD-10-CM | POA: Diagnosis not present

## 2018-12-17 DIAGNOSIS — D500 Iron deficiency anemia secondary to blood loss (chronic): Secondary | ICD-10-CM | POA: Diagnosis not present

## 2018-12-17 DIAGNOSIS — I132 Hypertensive heart and chronic kidney disease with heart failure and with stage 5 chronic kidney disease, or end stage renal disease: Secondary | ICD-10-CM | POA: Diagnosis not present

## 2018-12-17 DIAGNOSIS — R918 Other nonspecific abnormal finding of lung field: Secondary | ICD-10-CM

## 2018-12-17 LAB — NOVEL CORONAVIRUS, NAA (HOSP ORDER, SEND-OUT TO REF LAB; TAT 18-24 HRS): SARS-CoV-2, NAA: NOT DETECTED

## 2018-12-17 NOTE — Patient Instructions (Signed)
Social Worker Visit Information  Goals we discussed today:  Goals Addressed            This Visit's Progress   . "I would like my mom to have a CNA in her home"       Daughter stated:  Current Barriers:  . Limited knowledge of health plan benefits  Social Work Clinical Goal(s):  Marland Kitchen Over the next 20 days, the patient will work with SW to determined health plan benefits. . Over the next 45 days the patient and her daughter will become more knowledgeable of community resources available to assist with care needs  CCM SW Interventions: Completed 12/17/2018 . Initiated PCS application on behalf of the patient . Collaboration with the patients provider for completion of application process . Scheduled follow up call to the patient to assist with navigation of PCS application process  Patient Self Care Activities:  . Self administers medications as prescribed . Attends all scheduled provider appointments . Calls provider office for new concerns or questions  Please see past updates related to this goal by clicking on the "Past Updates" button in the selected goal          Follow Up Plan: SW will follow up with patient by phone over the next two weeks   Daneen Schick, BSW, CDP Social Worker, Certified Dementia Practitioner [place] / [place] Management [phone]

## 2018-12-17 NOTE — Chronic Care Management (AMB) (Signed)
Chronic Care Management    Social Work Follow Up Note  12/17/2018 Name: Jessica Clarke MRN: [id number] DOB: 1950-07-10  Jessica Clarke is a 68 y.o. year old female who is a primary care patient of [name], [place]. The CCM team was consulted for assistance with care coordination.   Review of patient status, including review of consultants reports, other relevant assessments, and collaboration with appropriate care team members and the patient's provider was performed as part of comprehensive patient evaluation and provision of chronic care management services.    SW initiated [facility] application to submit to [facility].  Outpatient Encounter Medications as of 12/17/2018  Medication Sig Note  . acetaminophen (TYLENOL) 325 MG tablet Take 325-650 mg by mouth every 8 (eight) hours as needed for mild pain.    Marland Kitchen allopurinol (ZYLOPRIM) 100 MG tablet Take 100 mg by mouth daily.     . Amino Acids-Protein Hydrolys (FEEDING SUPPLEMENT, PRO-STAT SUGAR FREE 64,) LIQD Take 30 mLs by mouth 2 (two) times daily.   Marland Kitchen aspirin EC 325 MG tablet Take 1 tablet (325 mg total) by mouth daily. 10/17/2018: Regimen confirmed to be accurate by the patient  . atenolol (TENORMIN) 100 MG tablet Take by mouth.   Marland Kitchen atorvastatin (LIPITOR) 10 MG tablet Take 10 mg by mouth at bedtime.    . B Complex-Folic Acid (B COMPLEX FORMULA 1) TABS Take by mouth.   . calcitRIOL (ROCALTROL) 0.5 MCG capsule Take 2 capsules (1 mcg total) by mouth every Monday, Wednesday, and Friday with hemodialysis.   Marland Kitchen cinacalcet (SENSIPAR) 30 MG tablet Take 1 tablet (30 mg total) by mouth daily with supper.   . colchicine 0.6 MG tablet Take 0.6 mg by mouth daily as needed (as directed for gout flares or pain).    . Darbepoetin Alfa (ARANESP) 60 MCG/0.3ML SOSY injection Inject 0.3 mLs (60 mcg total) into the vein every Friday with hemodialysis.   . heparin 1000 UNIT/ML injection Heparin Sodium (Porcine) 1,000 Units/mL Systemic   . hydrocerin (EUCERIN)  CREA Apply 1 application topically 2 (two) times daily.   Marland Kitchen loperamide (IMODIUM) 2 MG capsule Take by mouth.   . midodrine (PROAMATINE) 10 MG tablet Take 1 tablet (10 mg total) by mouth 2 (two) times daily with a meal.   . multivitamin (RENA-VIT) TABS tablet Take 1 tablet by mouth at bedtime.   . nicotine (NICODERM CQ - DOSED IN MG/24 HOURS) 14 mg/24hr patch Place 1 patch (14 mg total) onto the skin daily as needed (smoking craving).   . thiamine (VITAMIN B-1) 100 MG tablet Take 100 mg by mouth daily.   . VELPHORO 500 MG chewable tablet Chew 2 tablets by mouth 3 (three) times daily with meals.    Marland Kitchen VITAMIN D, CHOLECALCIFEROL, PO Take by mouth.    No facility-administered encounter medications on file as of 12/17/2018.      Goals Addressed            This Visit's Progress   . "I would like my mom to have a CNA in her home"       Daughter stated:  Current Barriers:  . Limited knowledge of health plan benefits  Social Work Clinical Goal(s):  Marland Kitchen Over the next 20 days, the patient will work with SW to determined health plan benefits. . Over the next 45 days the patient and her daughter will become more knowledgeable of community resources available to assist with care needs  CCM SW Interventions: Completed 12/17/2018 . Initiated PCS  application on behalf of the patient . Collaboration with the patients provider for completion of application process . Scheduled follow up call to the patient to assist with navigation of PCS application process  Patient Self Care Activities:  . Self administers medications as prescribed . Attends all scheduled provider appointments . Calls provider office for new concerns or questions  Please see past updates related to this goal by clicking on the "Past Updates" button in the selected goal          Follow Up Plan: SW will follow up with patient by phone over the next two weeks.   Daneen Schick, BSW, CDP Social Worker, Certified Dementia  Practitioner [place] / [place] Management [phone]  Total time spent performing care coordination and/or care management activities with the patient by phone or face to face = 12 minutes.

## 2018-12-17 NOTE — Progress Notes (Signed)
SDW-pre-op call completed by pt daughter, Jessica Clarke, per pt request. Daughter stated that mother has SOB " because of lung cancer  and she had diarrhea since she left the hospital. " Daughter denies that pt has chest pain. Daughter denies that pt is under the care of a cardiologist. Daughter atted that pt PCP is [name], [place]. Daughter stated that pt last dose of Aspirin was " Sunday" as instructed by MD. Daughter made aware to have pt stop taking  vitamins, fish oil and herbal medications. Do not take any NSAIDs ie: Ibuprofen, Advil, Naproxen (Aleve), Motrin, BC and Goody Powder. Daughter verbalized understanding of all pre-op instructions.

## 2018-12-17 NOTE — Progress Notes (Signed)
PA, Anesthesiology, asked to review pt history.

## 2018-12-18 ENCOUNTER — Telehealth: Payer: Self-pay

## 2018-12-18 DIAGNOSIS — D631 Anemia in chronic kidney disease: Secondary | ICD-10-CM | POA: Diagnosis not present

## 2018-12-18 DIAGNOSIS — Z992 Dependence on renal dialysis: Secondary | ICD-10-CM | POA: Diagnosis not present

## 2018-12-18 DIAGNOSIS — N2581 Secondary hyperparathyroidism of renal origin: Secondary | ICD-10-CM | POA: Diagnosis not present

## 2018-12-18 DIAGNOSIS — N186 End stage renal disease: Secondary | ICD-10-CM | POA: Diagnosis not present

## 2018-12-18 NOTE — Anesthesia Preprocedure Evaluation (Addendum)
Anesthesia Evaluation  Patient identified by MRN, date of birth, ID band Patient awake    Reviewed: Allergy & Precautions, NPO status , Patient's Chart, lab work & pertinent test results  Airway Mallampati: II  TM Distance: >3 FB Neck ROM: Full    Dental  (+) Edentulous Upper, Dental Advisory Given, Poor Dentition   Pulmonary Current Smoker and Patient abstained from smoking.,    breath sounds clear to auscultation       Cardiovascular hypertension, + Past MI   Rhythm:Regular Rate:Normal     Neuro/Psych    GI/Hepatic   Endo/Other    Renal/GU DialysisRenal diseaseLast HD Wednesday 11/4 per patient     Musculoskeletal   Abdominal   Peds  Hematology   Anesthesia Other Findings   Reproductive/Obstetrics                           Anesthesia Physical Anesthesia Plan  ASA: III  Anesthesia Plan: General   Post-op Pain Management:    Induction: Intravenous  PONV Risk Score and Plan: Ondansetron and Dexamethasone  Airway Management Planned: Oral ETT  Additional Equipment:   Intra-op Plan:   Post-operative Plan: Extubation in OR  Informed Consent: I have reviewed the patients History and Physical, chart, labs and discussed the procedure including the risks, benefits and alternatives for the proposed anesthesia with the patient or authorized representative who has indicated his/her understanding and acceptance.     Dental advisory given  Plan Discussed with: CRNA  Anesthesia Plan Comments: (Recently admitted 9/3-9/12 for symptomatic anemia likely 2/2 lower GI bleed and syncope. Hgb 6.6 on admission. Recv'd 3 units PRBC and hgb stabilized. Syncope felt likely related to anemia. EKG with no acute ST changes. Echo with EF 76-16%, diastolic dysfunction, severe TR.  She continues to follow outpatient with GI for persistent diarrhea and anemia. Last labs 12/09/18 with Hgb 9.2.  History of  NSTEMI with nonobstructive CAD in 2015. Per cardiology note at the time "NSTEMI with acute on chronic systolic heart failure. All likely due to strain from hypertensive crisis, status post left heart cath which was unremarkable with nonobstructive CAD."  ESRD on HD. Nephrologist is Dr. Justin Mend.  Will need DOS labs and eval.  CT Chest 11/12/18: IMPRESSION: 1. Interval growth of spiculated cavitary 3.1 cm right upper lobe lung mass, compatible with primary bronchogenic carcinoma. 2. Interval progression of right paratracheal lymphadenopathy, suspicious for metastatic disease. 3. Additional mildly enlarged right retropectoral, left supraclavicular and left mediastinal lymph nodes are unchanged and nonspecific, potentially reactive. 4. Small dependent right pleural effusion. 5. Small volume upper abdominal ascites. 6. Anasarca. 7. Cardiomegaly. Three-vessel coronary atherosclerosis. Dilated main pulmonary artery suggests pulmonary hypertension. 8. Renal osteodystrophy.  TTE 10/18/18:  1. The left ventricle has normal systolic function with an ejection fraction of 60-65%. The cavity size was normal. There is moderately increased left ventricular wall thickness. Left ventricular diastolic Doppler parameters are consistent with impaired  relaxation. Elevated left atrial and left ventricular end-diastolic pressures The E/e' is >15. There is right ventricular volume and pressure overload.  2. The right ventricle has mildly reduced systolic function. The cavity was mildly enlarged. There is mildly increased right ventricular wall thickness.  3. Left atrial size was moderately dilated.  4. Right atrial size was severely dilated.  5. The pericardial effusion is posterior to the left ventricle.  6. Trivial pericardial effusion is present.  7. The mitral valve is abnormal. Moderate thickening of the mitral  valve leaflet.  8. The tricuspid valve is abnormal. Tricuspid valve regurgitation is severe.  9.  The aortic valve is tricuspid. Aortic valve regurgitation is trivial by color flow Doppler. No stenosis of the aortic valve. 10. The aorta is normal unless otherwise noted. 11. The inferior vena cava was dilated in size with <50% respiratory variability. 12. Evidence of atrial level shunting detected by color flow Doppler. 13. There is left bowing of the interatrial septum, suggestive of elevated right atrial pressure.)      Anesthesia Quick Evaluation

## 2018-12-18 NOTE — Telephone Encounter (Signed)
Jenn with [facility] called stating she received a referral for pt and she wanted to know if the pt is going to be on hospice or if it is Palliative care. [phone]  I RETURNED HER CALL AND ADVISED HER THE REFERRAL IS FOR PALLIATIVE. JENN STATED SHE THINKS SHE IS ALREADY ON THAT SO WE TOLD HER TO DISREGARD THE REFERRAL PER JANECE MOORE FNP-BC . YRL,RMA

## 2018-12-18 NOTE — Progress Notes (Signed)
Anesthesia Chart Review: Same day workup  Recently admitted 9/3-9/12 for symptomatic anemia likely 2/2 lower GI bleed and syncope. Hgb 6.6 on admission. Recv'd 3 units PRBC and hgb stabilized. Syncope felt likely related to anemia. EKG with no acute ST changes. Echo with EF 24-82%, diastolic dysfunction, severe TR.  She continues to follow outpatient with GI for persistent diarrhea and anemia. Last labs 12/09/18 with Hgb 9.2.  History of NSTEMI with nonobstructive CAD in 2015. Per cardiology note at the time "NSTEMI with acute on chronic systolic heart failure. All likely due to strain from hypertensive crisis, status post left heart cath which was unremarkable with nonobstructive CAD."  ESRD on HD. Nephrologist is Dr. Justin Mend.  Will need DOS labs and eval.  CT Chest 11/12/18: IMPRESSION: 1. Interval growth of spiculated cavitary 3.1 cm right upper lobe lung mass, compatible with primary bronchogenic carcinoma. 2. Interval progression of right paratracheal lymphadenopathy, suspicious for metastatic disease. 3. Additional mildly enlarged right retropectoral, left supraclavicular and left mediastinal lymph nodes are unchanged and nonspecific, potentially reactive. 4. Small dependent right pleural effusion. 5. Small volume upper abdominal ascites. 6. Anasarca. 7. Cardiomegaly. Three-vessel coronary atherosclerosis. Dilated main pulmonary artery suggests pulmonary hypertension. 8. Renal osteodystrophy.  TTE 10/18/18:  1. The left ventricle has normal systolic function with an ejection fraction of 60-65%. The cavity size was normal. There is moderately increased left ventricular wall thickness. Left ventricular diastolic Doppler parameters are consistent with impaired  relaxation. Elevated left atrial and left ventricular end-diastolic pressures The E/e' is >15. There is right ventricular volume and pressure overload.  2. The right ventricle has mildly reduced systolic function. The cavity was  mildly enlarged. There is mildly increased right ventricular wall thickness.  3. Left atrial size was moderately dilated.  4. Right atrial size was severely dilated.  5. The pericardial effusion is posterior to the left ventricle.  6. Trivial pericardial effusion is present.  7. The mitral valve is abnormal. Moderate thickening of the mitral valve leaflet.  8. The tricuspid valve is abnormal. Tricuspid valve regurgitation is severe.  9. The aortic valve is tricuspid. Aortic valve regurgitation is trivial by color flow Doppler. No stenosis of the aortic valve. 10. The aorta is normal unless otherwise noted. 11. The inferior vena cava was dilated in size with <50% respiratory variability. 12. Evidence of atrial level shunting detected by color flow Doppler. 13. There is left bowing of the interatrial septum, suggestive of elevated right atrial pressure.   Wynonia Musty [facility] Short Stay Center/Anesthesiology Phone [phone] 12/18/2018 11:23 AM

## 2018-12-19 ENCOUNTER — Encounter (HOSPITAL_COMMUNITY)
Admission: RE | Disposition: A | Discharge status: Home / Self Care | Payer: Self-pay | Source: Home / Self Care | Attending: Pulmonary Disease

## 2018-12-19 ENCOUNTER — Ambulatory Visit (HOSPITAL_COMMUNITY)
Admission: RE | Admit: 2018-12-19 | Discharge: 2018-12-19 | Disposition: A | Discharge status: Home / Self Care | Payer: Medicare Other | Attending: Pulmonary Disease | Admitting: Pulmonary Disease

## 2018-12-19 ENCOUNTER — Other Ambulatory Visit: Payer: Self-pay

## 2018-12-19 ENCOUNTER — Ambulatory Visit (HOSPITAL_COMMUNITY): Payer: Medicare Other | Admitting: Physician Assistant

## 2018-12-19 ENCOUNTER — Ambulatory Visit: Payer: Self-pay

## 2018-12-19 ENCOUNTER — Encounter (HOSPITAL_COMMUNITY): Payer: Self-pay | Admitting: *Deleted

## 2018-12-19 DIAGNOSIS — Z992 Dependence on renal dialysis: Secondary | ICD-10-CM | POA: Diagnosis not present

## 2018-12-19 DIAGNOSIS — C771 Secondary and unspecified malignant neoplasm of intrathoracic lymph nodes: Secondary | ICD-10-CM | POA: Diagnosis not present

## 2018-12-19 DIAGNOSIS — C801 Malignant (primary) neoplasm, unspecified: Secondary | ICD-10-CM | POA: Diagnosis not present

## 2018-12-19 DIAGNOSIS — I120 Hypertensive chronic kidney disease with stage 5 chronic kidney disease or end stage renal disease: Secondary | ICD-10-CM | POA: Diagnosis not present

## 2018-12-19 DIAGNOSIS — Z7982 Long term (current) use of aspirin: Secondary | ICD-10-CM | POA: Insufficient documentation

## 2018-12-19 DIAGNOSIS — I252 Old myocardial infarction: Secondary | ICD-10-CM | POA: Diagnosis not present

## 2018-12-19 DIAGNOSIS — F17210 Nicotine dependence, cigarettes, uncomplicated: Secondary | ICD-10-CM | POA: Insufficient documentation

## 2018-12-19 DIAGNOSIS — E785 Hyperlipidemia, unspecified: Secondary | ICD-10-CM | POA: Diagnosis not present

## 2018-12-19 DIAGNOSIS — M109 Gout, unspecified: Secondary | ICD-10-CM | POA: Insufficient documentation

## 2018-12-19 DIAGNOSIS — R918 Other nonspecific abnormal finding of lung field: Secondary | ICD-10-CM | POA: Diagnosis not present

## 2018-12-19 DIAGNOSIS — N189 Chronic kidney disease, unspecified: Secondary | ICD-10-CM | POA: Diagnosis not present

## 2018-12-19 DIAGNOSIS — Z79899 Other long term (current) drug therapy: Secondary | ICD-10-CM | POA: Diagnosis not present

## 2018-12-19 DIAGNOSIS — I130 Hypertensive heart and chronic kidney disease with heart failure and stage 1 through stage 4 chronic kidney disease, or unspecified chronic kidney disease: Secondary | ICD-10-CM | POA: Diagnosis not present

## 2018-12-19 DIAGNOSIS — I10 Essential (primary) hypertension: Secondary | ICD-10-CM

## 2018-12-19 DIAGNOSIS — C781 Secondary malignant neoplasm of mediastinum: Secondary | ICD-10-CM | POA: Insufficient documentation

## 2018-12-19 DIAGNOSIS — I509 Heart failure, unspecified: Secondary | ICD-10-CM | POA: Diagnosis not present

## 2018-12-19 DIAGNOSIS — R590 Localized enlarged lymph nodes: Secondary | ICD-10-CM

## 2018-12-19 DIAGNOSIS — N186 End stage renal disease: Secondary | ICD-10-CM

## 2018-12-19 HISTORY — DX: Malignant (primary) neoplasm, unspecified: C80.1

## 2018-12-19 HISTORY — PX: VIDEO BRONCHOSCOPY WITH ENDOBRONCHIAL ULTRASOUND: SHX6177

## 2018-12-19 HISTORY — DX: Dyspnea, unspecified: R06.00

## 2018-12-19 HISTORY — DX: Unspecified osteoarthritis, unspecified site: M19.90

## 2018-12-19 LAB — CBC
HCT: 32.3 % — ABNORMAL LOW (ref 36.0–46.0)
Hemoglobin: 10.3 g/dL — ABNORMAL LOW (ref 12.0–15.0)
MCH: 32.5 pg (ref 26.0–34.0)
MCHC: 31.9 g/dL (ref 30.0–36.0)
MCV: 101.9 fL — ABNORMAL HIGH (ref 80.0–100.0)
Platelets: 150 10*3/uL (ref 150–400)
RBC: 3.17 MIL/uL — ABNORMAL LOW (ref 3.87–5.11)
RDW: 16.6 % — ABNORMAL HIGH (ref 11.5–15.5)
WBC: 4.3 10*3/uL (ref 4.0–10.5)
nRBC: 0 % (ref 0.0–0.2)

## 2018-12-19 LAB — PROTIME-INR
INR: 1.2 (ref 0.8–1.2)
Prothrombin Time: 14.9 seconds (ref 11.4–15.2)

## 2018-12-19 LAB — COMPREHENSIVE METABOLIC PANEL
ALT: 12 U/L (ref 0–44)
AST: 23 U/L (ref 15–41)
Albumin: 2.7 g/dL — ABNORMAL LOW (ref 3.5–5.0)
Alkaline Phosphatase: 166 U/L — ABNORMAL HIGH (ref 38–126)
Anion gap: 11 (ref 5–15)
BUN: 17 mg/dL (ref 8–23)
CO2: 26 mmol/L (ref 22–32)
Calcium: 9.3 mg/dL (ref 8.9–10.3)
Chloride: 94 mmol/L — ABNORMAL LOW (ref 98–111)
Creatinine, Ser: 3.61 mg/dL — ABNORMAL HIGH (ref 0.44–1.00)
GFR calc Af Amer: 14 mL/min — ABNORMAL LOW (ref >60)
GFR calc non Af Amer: 12 mL/min — ABNORMAL LOW (ref >60)
Glucose, Bld: 79 mg/dL (ref 70–99)
Potassium: 3.6 mmol/L (ref 3.5–5.1)
Sodium: 131 mmol/L — ABNORMAL LOW (ref 135–145)
Total Bilirubin: 0.8 mg/dL (ref 0.3–1.2)
Total Protein: 7.1 g/dL (ref 6.5–8.1)

## 2018-12-19 LAB — APTT: aPTT: 41 seconds — ABNORMAL HIGH (ref 24–36)

## 2018-12-19 SURGERY — BRONCHOSCOPY, WITH EBUS
Anesthesia: General

## 2018-12-19 MED ORDER — ONDANSETRON HCL 4 MG/2ML IJ SOLN: 4.0000 mg | Freq: Once | INTRAMUSCULAR | Status: DC | PRN

## 2018-12-19 MED ORDER — OXYCODONE HCL 5 MG PO TABS: 5.0000 mg | ORAL_TABLET | Freq: Once | ORAL | Status: DC | PRN

## 2018-12-19 MED ORDER — SUGAMMADEX SODIUM 200 MG/2ML IV SOLN
INTRAVENOUS | Status: DC | PRN
  Administered 2018-12-19: 200 mg via INTRAVENOUS

## 2018-12-19 MED ORDER — DEXAMETHASONE SODIUM PHOSPHATE 10 MG/ML IJ SOLN
INTRAMUSCULAR | Status: AC
  Filled 2018-12-19: qty 1

## 2018-12-19 MED ORDER — LIDOCAINE 2% (20 MG/ML) 5 ML SYRINGE
INTRAMUSCULAR | Status: DC | PRN
  Administered 2018-12-19: 60 mg via INTRAVENOUS

## 2018-12-19 MED ORDER — FENTANYL CITRATE (PF) 250 MCG/5ML IJ SOLN
INTRAMUSCULAR | Status: AC
  Filled 2018-12-19: qty 5

## 2018-12-19 MED ORDER — PHENYLEPHRINE HCL-NACL 10-0.9 MG/250ML-% IV SOLN
INTRAVENOUS | Status: DC | PRN
  Administered 2018-12-19: 50 ug/min via INTRAVENOUS

## 2018-12-19 MED ORDER — ONDANSETRON HCL 4 MG/2ML IJ SOLN
INTRAMUSCULAR | Status: DC | PRN
  Administered 2018-12-19: 4 mg via INTRAVENOUS

## 2018-12-19 MED ORDER — EPHEDRINE 5 MG/ML INJ
INTRAVENOUS | Status: AC
  Filled 2018-12-19: qty 10

## 2018-12-19 MED ORDER — FENTANYL CITRATE (PF) 100 MCG/2ML IJ SOLN: 25.0000 ug | INTRAMUSCULAR | Status: DC | PRN

## 2018-12-19 MED ORDER — OXYCODONE HCL 5 MG/5ML PO SOLN: 5.0000 mg | Freq: Once | ORAL | Status: DC | PRN

## 2018-12-19 MED ORDER — SODIUM CHLORIDE 0.9 % IV SOLN
INTRAVENOUS | Status: DC | PRN
  Administered 2018-12-19: 10:00:00 via INTRAVENOUS

## 2018-12-19 MED ORDER — LIDOCAINE 2% (20 MG/ML) 5 ML SYRINGE
INTRAMUSCULAR | Status: AC
  Filled 2018-12-19: qty 5

## 2018-12-19 MED ORDER — MIDAZOLAM HCL 2 MG/2ML IJ SOLN
INTRAMUSCULAR | Status: AC
  Filled 2018-12-19: qty 2

## 2018-12-19 MED ORDER — PROPOFOL 10 MG/ML IV BOLUS
INTRAVENOUS | Status: DC | PRN
  Administered 2018-12-19: 130 mg via INTRAVENOUS

## 2018-12-19 MED ORDER — ONDANSETRON HCL 4 MG/2ML IJ SOLN
INTRAMUSCULAR | Status: AC
  Filled 2018-12-19: qty 2

## 2018-12-19 MED ORDER — EPHEDRINE SULFATE-NACL 50-0.9 MG/10ML-% IV SOSY
PREFILLED_SYRINGE | INTRAVENOUS | Status: DC | PRN
  Administered 2018-12-19: 10 mg via INTRAVENOUS

## 2018-12-19 MED ORDER — EPINEPHRINE PF 1 MG/ML IJ SOLN
INTRAMUSCULAR | Status: AC
  Filled 2018-12-19: qty 1

## 2018-12-19 MED ORDER — ROCURONIUM BROMIDE 10 MG/ML (PF) SYRINGE
PREFILLED_SYRINGE | INTRAVENOUS | Status: AC
  Filled 2018-12-19: qty 10

## 2018-12-19 MED ORDER — LIDOCAINE HCL 1 % IJ SOLN
INTRAMUSCULAR | Status: AC
  Filled 2018-12-19: qty 20

## 2018-12-19 MED ORDER — ROCURONIUM BROMIDE 10 MG/ML (PF) SYRINGE
PREFILLED_SYRINGE | INTRAVENOUS | Status: DC | PRN
  Administered 2018-12-19: 40 mg via INTRAVENOUS
  Administered 2018-12-19: 10 mg via INTRAVENOUS

## 2018-12-19 SURGICAL SUPPLY — 36 items
ADAPTER VALVE BIOPSY EBUS (MISCELLANEOUS) IMPLANT
ADPTR VALVE BIOPSY EBUS (MISCELLANEOUS)
BRUSH CYTOL CELLEBRITY 1.5X140 (MISCELLANEOUS) IMPLANT
CANISTER SUCT 3000ML PPV (MISCELLANEOUS) ×3 IMPLANT
CONT SPEC 4OZ CLIKSEAL STRL BL (MISCELLANEOUS) ×3 IMPLANT
COVER BACK TABLE 60X90IN (DRAPES) ×3 IMPLANT
FILTER STRAW FLUID ASPIR (MISCELLANEOUS) IMPLANT
FORCEPS BIOP RJ4 1.8 (CUTTING FORCEPS) IMPLANT
GAUZE SPONGE 4X4 12PLY STRL (GAUZE/BANDAGES/DRESSINGS) ×3 IMPLANT
GLOVE SURG SS PI 7.5 STRL IVOR (GLOVE) ×6 IMPLANT
GOWN STRL REUS W/ TWL LRG LVL3 (GOWN DISPOSABLE) ×2 IMPLANT
GOWN STRL REUS W/TWL LRG LVL3 (GOWN DISPOSABLE) ×6
KIT CLEAN ENDO COMPLIANCE (KITS) ×6 IMPLANT
KIT TURNOVER KIT B (KITS) ×3 IMPLANT
MARKER SKIN DUAL TIP RULER LAB (MISCELLANEOUS) ×3 IMPLANT
NDL ASPIRATION VIZISHOT 19G (NEEDLE) IMPLANT
NDL ASPIRATION VIZISHOT 21G (NEEDLE) IMPLANT
NEEDLE ASPIRATION VIZISHOT 19G (NEEDLE) ×3 IMPLANT
NEEDLE ASPIRATION VIZISHOT 21G (NEEDLE) IMPLANT
NS IRRIG 1000ML POUR BTL (IV SOLUTION) ×3 IMPLANT
OIL SILICONE PENTAX (PARTS (SERVICE/REPAIRS)) ×3 IMPLANT
PAD ARMBOARD 7.5X6 YLW CONV (MISCELLANEOUS) ×6 IMPLANT
STOPCOCK 4 WAY LG BORE MALE ST (IV SETS) ×3 IMPLANT
SYR 20ML ECCENTRIC (SYRINGE) ×9 IMPLANT
SYR 20ML LL LF (SYRINGE) ×3 IMPLANT
SYR 3ML LL SCALE MARK (SYRINGE) IMPLANT
SYR 50ML SLIP (SYRINGE) ×3 IMPLANT
SYR 5ML LL (SYRINGE) ×6 IMPLANT
TRAP SPECIMEN MUCOUS 40CC (MISCELLANEOUS) IMPLANT
TUBE CONNECTING 20'X1/4 (TUBING) ×1
TUBE CONNECTING 20X1/4 (TUBING) ×2 IMPLANT
TUBING EXTENTION W/L.L. (IV SETS) ×3 IMPLANT
VALVE BIOPSY  SINGLE USE (MISCELLANEOUS) ×2
VALVE BIOPSY SINGLE USE (MISCELLANEOUS) ×1 IMPLANT
VALVE SUCTION BRONCHIO DISP (MISCELLANEOUS) ×3 IMPLANT
WATER STERILE IRR 1000ML POUR (IV SOLUTION) ×3 IMPLANT

## 2018-12-19 NOTE — Patient Instructions (Signed)
Visit Information  Goals Addressed      Patient Stated   . "I need a biopsy to determine what medication will help" (pt-stated)       Current Barriers:  . Limited testing to identify lesion in L lung  Clinical Social Work Clinical Goal(s):  Marland Kitchen Over the next 30 days the patient will attend lung biopsy as scheduled on 12/04/18 to assist with tissue identification . Over the next 60 days the patient will become more knowledgeable of diagnosis as evidenced by ability to verbalize treatment plan  CCM RN CM Interventions: 12/19/18 call completed with patients daughter [name]  . Received an inbound call from daughter [name] advising her mother is having an outpatient bronchoscopy today at [facility]; discussed [name] and Ms. Posa have been instructed by the Pulmonologist of next steps and what to expect following the pathology results from the biopsy; discussed patient is expected to be d/c home today and will keep her dialysis appointment tomorrow . Discussed plans with patient for ongoing care management follow up and provided patient with direct contact information for care management team  Patient Self Care Activities:  . Self administers medications as prescribed . Attends all scheduled provider appointments . Calls provider office for new concerns or questions   Please see past updates related to this goal by clicking on the "Past Updates" button in the selected goal        Other   . "I would like my mom to have a CNA in her home"       Daughter stated:  Current Barriers:  . Limited knowledge of health plan benefits  Social Work Clinical Goal(s):  Marland Kitchen Over the next 20 days, the patient will work with SW to determined health plan benefits. . Over the next 45 days the patient and her daughter will become more knowledgeable of community resources available to assist with care needs  CCM SW Interventions: Completed 12/17/2018 . Initiated PCS application on behalf of the  patient . Collaboration with the patients provider for completion of application process . Scheduled follow up call to the patient to assist with navigation of PCS application process  CCM RN CM Interventions:  12/19/18 call completed with daughter [name] call received from daughter [name] to advise Ms. Regner has been approved for her own apartment; discussed Ms. Gravette will require a form to be completed by PCP provider indicating the patient will require 24 hour supervised care Provided [name] with the fax # for [facility]; she will fax this form to [name], FNP attention Sent in basket message to embedded [place] with an update  Patient Self Care Activities:  . Self administers medications as prescribed . Attends all scheduled provider appointments . Calls provider office for new concerns or questions  Please see past updates related to this goal by clicking on the "Past Updates" button in the selected goal      . "We haven't heard from the stomach doctor"       Daughter stated Current Barriers:  Marland Kitchen Knowledge Deficits related to treatment management of Gastrointestinal hemorrhage and suspicious Colon polyp  Nurse Case Manager Clinical Goal(s):  Marland Kitchen Over the next 30 days, patient will have a new patient appointment with Gastroenterology for evaluation and treatment of recent GI hemorrhage and Colon polyp COMPLETED . New 12/19/18 - Over the next 30 days, patient will have a good understanding of the plan for ongoing treatment management of her GI symptoms and will  adhere to those recommendations  CCM RN CM Interventions:  12/19/18 call completed with patients daughter [name]  Received inbound call from daughter [name], patient completed her a GI consultation with Dr. 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 Infectious etiology suspected. Post-infectious diarrhea-IBS is also a possibility. Labs today to follow-up on anemia and monitor for electrolyte disturbances related to ongoing diarrhea  PLAN: CMP, CBC Stool pathogen panel Add daily stool bulking agent like Metamucil Followup in the office with me or an APP in 2 weeks" . Discussed plans with patient for ongoing care management follow up and provided patient with direct contact information for care management team  Patient Self Care Activities:  . Unable to independently to manage Chronic conditions  Please see past updates related to this goal by clicking on the "Past Updates" button in the selected goal         The patient verbalized understanding of instructions provided today and declined a print copy of patient instruction materials.   Telephone follow up appointment with care management team member scheduled for: 12/20/18  Barb Merino, RN, BSN, CCM Care Management Coordinator [place] [facility]  Direct Phone: [phone]

## 2018-12-19 NOTE — Anesthesia Postprocedure Evaluation (Signed)
Anesthesia Post Note  Patient: Jessica Clarke  Procedure(s) Performed: VIDEO BRONCHOSCOPY WITH ENDOBRONCHIAL ULTRASOUND (N/A )     Patient location during evaluation: PACU Anesthesia Type: General Level of consciousness: awake and alert Pain management: pain level controlled Vital Signs Assessment: post-procedure vital signs reviewed and stable Respiratory status: spontaneous breathing, nonlabored ventilation, respiratory function stable and patient connected to nasal cannula oxygen Cardiovascular status: blood pressure returned to baseline and stable Postop Assessment: no apparent nausea or vomiting Anesthetic complications: no    Last Vitals:  Vitals:   12/19/18 1241 12/19/18 1254  BP: 112/66 105/64  Pulse:  62  Resp:  18  Temp:  (!) 36.1 C  SpO2:  92%    Last Pain:  Vitals:   12/19/18 1254  TempSrc:   PainSc: 0-No pain                 Marius Betts COKER

## 2018-12-19 NOTE — Chronic Care Management (AMB) (Signed)
Chronic Care Management   Follow Up Note   12/19/2018 Name: Jessica Clarke MRN: [id number] DOB: [birth year]  Referred by: [name], FNP Reason for referral : Chronic Care Management (CCM RNCM Inbound Call)   Jessica Clarke is a 68 y.o. year old female who is a primary care patient of [name], [place]. The CCM team was consulted for assistance with chronic disease management and care coordination needs.    Review of patient status, including review of consultants reports, relevant laboratory and other test results, and collaboration with appropriate care team members and the patient's provider was performed as part of comprehensive patient evaluation and provision of chronic care management services.    SDOH (Social Determinants of Health) screening performed today: None. See Care Plan for related entries.   Inbound call received from daughter [name] to advise Ms. Langhans is having her bronchoscopy completed at [facility] today as an outpatient procedure.   Outpatient Encounter Medications as of 12/19/2018  Medication Sig Note  . acetaminophen (TYLENOL) 325 MG tablet Take 325-650 mg by mouth every 8 (eight) hours as needed for mild pain.    Marland Kitchen allopurinol (ZYLOPRIM) 100 MG tablet Take 100 mg by mouth daily.     . Amino Acids-Protein Hydrolys (FEEDING SUPPLEMENT, PRO-STAT SUGAR FREE 64,) LIQD Take 30 mLs by mouth 2 (two) times daily.   Marland Kitchen aspirin EC 325 MG tablet Take 1 tablet (325 mg total) by mouth daily. 10/17/2018: Regimen confirmed to be accurate by the patient  . atenolol (TENORMIN) 100 MG tablet Take by mouth.   Marland Kitchen atorvastatin (LIPITOR) 10 MG tablet Take 10 mg by mouth at bedtime.    . B Complex-Folic Acid (B COMPLEX FORMULA 1) TABS Take by mouth.   . calcitRIOL (ROCALTROL) 0.5 MCG capsule Take 2 capsules (1 mcg total) by mouth every Monday, Wednesday, and Friday with hemodialysis.   Marland Kitchen cinacalcet (SENSIPAR) 30 MG tablet Take 1 tablet (30 mg total) by mouth daily with supper.    . colchicine 0.6 MG tablet Take 0.6 mg by mouth daily as needed (as directed for gout flares or pain).    . Darbepoetin Alfa (ARANESP) 60 MCG/0.3ML SOSY injection Inject 0.3 mLs (60 mcg total) into the vein every Friday with hemodialysis.   . heparin 1000 UNIT/ML injection Heparin Sodium (Porcine) 1,000 Units/mL Systemic   . hydrocerin (EUCERIN) CREA Apply 1 application topically 2 (two) times daily.   Marland Kitchen loperamide (IMODIUM) 2 MG capsule Take by mouth.   . midodrine (PROAMATINE) 10 MG tablet Take 1 tablet (10 mg total) by mouth 2 (two) times daily with a meal.   . multivitamin (RENA-VIT) TABS tablet Take 1 tablet by mouth at bedtime.   . nicotine (NICODERM CQ - DOSED IN MG/24 HOURS) 14 mg/24hr patch Place 1 patch (14 mg total) onto the skin daily as needed (smoking craving).   . thiamine (VITAMIN B-1) 100 MG tablet Take 100 mg by mouth daily.   . VELPHORO 500 MG chewable tablet Chew 2 tablets by mouth 3 (three) times daily with meals.    Marland Kitchen VITAMIN D, CHOLECALCIFEROL, PO Take by mouth.    No facility-administered encounter medications on file as of 12/19/2018.      Goals Addressed      Patient Stated   . "I need a biopsy to determine what medication will help" (pt-stated)       Current Barriers:  . Limited testing to identify lesion in L lung  Clinical Social Work Clinical Goal(s):  .  Over the next 30 days the patient will attend lung biopsy as scheduled on 12/04/18 to assist with tissue identification . Over the next 60 days the patient will become more knowledgeable of diagnosis as evidenced by ability to verbalize treatment plan  CCM RN CM Interventions: 12/19/18 call completed with patients daughter [name]  . Received an inbound call from daughter [name] advising her mother is having an outpatient bronchoscopy today at [facility]; discussed [name] and Ms. Frees have been instructed by the Pulmonologist of next steps and what to expect following the pathology results from the  biopsy; discussed patient is expected to be d/c home today and will keep her dialysis appointment tomorrow . Discussed plans with patient for ongoing care management follow up and provided patient with direct contact information for care management team  Patient Self Care Activities:  . Self administers medications as prescribed . Attends all scheduled provider appointments . Calls provider office for new concerns or questions   Please see past updates related to this goal by clicking on the "Past Updates" button in the selected goal        Other   . "I would like my mom to have a CNA in her home"       Daughter stated:  Current Barriers:  . Limited knowledge of health plan benefits  Social Work Clinical Goal(s):  Marland Kitchen Over the next 20 days, the patient will work with SW to determined health plan benefits. . Over the next 45 days the patient and her daughter will become more knowledgeable of community resources available to assist with care needs  CCM SW Interventions: Completed 12/17/2018 . Initiated PCS application on behalf of the patient . Collaboration with the patients provider for completion of application process . Scheduled follow up call to the patient to assist with navigation of PCS application process  CCM RN CM Interventions:  12/19/18 call completed with daughter [name] call received from daughter [name] to advise Ms. Million has been approved for her own apartment; discussed Ms. Gatchel will require a form to be completed by PCP provider indicating the patient will require 24 hour supervised care Provided [name] with the fax # for [facility]; she will fax this form to [name], FNP attention Sent in basket message to embedded [place] with an update  Patient Self Care Activities:  . Self administers medications as prescribed . Attends all scheduled provider appointments . Calls provider office for new concerns or questions  Please see past updates  related to this goal by clicking on the "Past Updates" button in the selected goal      . "We haven't heard from the stomach doctor"       Daughter stated Current Barriers:  Marland Kitchen Knowledge Deficits related to treatment management of Gastrointestinal hemorrhage and suspicious Colon polyp  Nurse Case Manager Clinical Goal(s):  Marland Kitchen Over the next 30 days, patient will have a new patient appointment with Gastroenterology for evaluation and treatment of recent GI hemorrhage and Colon polyp COMPLETED . New 12/19/18 - Over the next 30 days, patient will have a good understanding of the plan for ongoing treatment management of her GI symptoms and will adhere to those recommendations  CCM RN CM Interventions:  12/19/18 call completed with patients daughter [name]  Received inbound call from daughter [name], patient completed her a GI consultation with Dr. 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 Infectious etiology suspected. Post-infectious diarrhea-IBS is also a possibility. Labs today to follow-up on anemia and monitor for electrolyte disturbances related to ongoing diarrhea  PLAN: CMP, CBC Stool pathogen panel Add daily stool bulking agent like Metamucil Followup in the office with me or an APP in 2 weeks" . Discussed plans with patient for ongoing care management follow up and provided patient with direct contact information for care management team   Patient Self Care Activities:  . Unable to independently to manage Chronic conditions  Please see past updates related to this goal by clicking on the "Past Updates" button in the selected goal          Telephone follow up appointment with care management team member scheduled for: 12/20/18  Barb Merino, RN, BSN, CCM Care Management Coordinator [place] [facility]  Direct Phone: [phone]

## 2018-12-19 NOTE — Interval H&P Note (Signed)
History and Physical Interval Note:  12/19/2018 10:15 AM  Jessica Clarke  has presented today for surgery, with the diagnosis of MASS OF UPPER LOBE OF RIGHT LUNG, MEDIASTINAL ADENOPATHY,HILAR ADENOPATHY.  The various methods of treatment have been discussed with the patient and family. After consideration of risks, benefits and other options for treatment, the patient has consented to  Procedure(s): [place] (N/A) as a surgical intervention.  The patient's history has been reviewed, patient examined, no change in status, stable for surgery.  I have reviewed the patient's chart and labs.  Questions were answered to the patient's satisfaction.    Patient seen in pre-op. All questions answered. No barriers to proceed.   [place]

## 2018-12-19 NOTE — Op Note (Addendum)
Video Bronchoscopy with Endobronchial Ultrasound Procedure Note  Date of Operation: 12/19/2018  Pre-op Diagnosis: Mediastinal adenopathy, lung mass  Post-op Diagnosis: Mediastinal adenopathy, lung mass  Surgeon: Garner Nash, DO, Lenice Llamas, MD   Assistants: None   Anesthesia: General endotracheal anesthesia  Operation: Flexible video fiberoptic bronchoscopy with endobronchial ultrasound and biopsies.  Estimated Blood Loss: Minimal, <3KG   Complications: None   Indications and History: Jessica Clarke is a 68 y.o. female with mediastinal adenopathy, lung mass.  The risks, benefits, complications, treatment options and expected outcomes were discussed with the patient.  The possibilities of pneumothorax, pneumonia, reaction to medication, pulmonary aspiration, perforation of a viscus, bleeding, failure to diagnose a condition and creating a complication requiring transfusion or operation were discussed with the patient who freely signed the consent.    Description of Procedure: The patient was examined in the preoperative area and history and data from the preprocedure consultation were reviewed. It was deemed appropriate to proceed.  The patient was taken to [facility] R 11, identified as [name] and the procedure verified as Flexible Video Fiberoptic Bronchoscopy.  A Time Out was held and the above information confirmed. After being taken to the operating room general anesthesia was initiated and the patient  was orally intubated. The video fiberoptic bronchoscope was introduced via the endotracheal tube and a general inspection was performed which showed normal right and left lung anatomy areas of bronchial pitting, no evidence of endobronchial lesion. The standard scope was then withdrawn and the endobronchial ultrasound was used to identify and characterize the peritracheal, hilar and bronchial lymph nodes. Inspection showed enlarged station 7 and station 4R lymph node. Using real-time  ultrasound guidance transbronchial needle biopsies were take from Station 7, 4R nodes and were sent for cytology. The patient tolerated the procedure well without apparent complications. There was no significant blood loss. The bronchoscope was withdrawn. Anesthesia was reversed and the patient was taken to the PACU for recovery.   Samples: 1. Wang needle biopsies from station 7 node, 6 needle passes 2. Wang needle biopsies from station 4R node, 6 needle passes  Plans:  The patient will be discharged from the PACU to home when recovered from anesthesia. We will review the cytology, pathology and microbiology results with the patient when they become available. Outpatient followup will be with Octavio Graves Viney Acocella, DO.   Preliminary pathology report: Station 4 are adequate for tissue diagnosis.  Garner Nash, DO [facility] 12/19/2018 12:18 PM

## 2018-12-19 NOTE — Transfer of Care (Signed)
Immediate Anesthesia Transfer of Care Note  Patient: Jessica Clarke  Procedure(s) Performed: VIDEO BRONCHOSCOPY WITH ENDOBRONCHIAL ULTRASOUND (N/A )  Patient Location: PACU  Anesthesia Type:General  Level of Consciousness: awake, oriented and patient cooperative  Airway & Oxygen Therapy: Patient Spontanous Breathing and Patient connected to face mask oxygen  Post-op Assessment: Report given to RN and Post -op Vital signs reviewed and stable  Post vital signs: Reviewed  Last Vitals:  Vitals Value Taken Time  BP 105/82 12/19/18 1226  Temp    Pulse 61 12/19/18 1231  Resp 15 12/19/18 1231  SpO2 96 % 12/19/18 1231  Vitals shown include unvalidated device data.  Last Pain:  Vitals:   12/19/18 1016  TempSrc:   PainSc: 0-No pain      Patients Stated Pain Goal: 5 (43/56/86 [id number])  Complications: No apparent anesthesia complications

## 2018-12-19 NOTE — Discharge Instructions (Signed)
Flexible Bronchoscopy, Care After This sheet gives you information about how to care for yourself after your test. Your doctor may also give you more specific instructions. If you have problems or questions, contact your doctor. Follow these instructions at home: Eating and drinking:  The day after the test, go back to your normal diet. Driving  Do not drive for 24 hours if you were given a medicine to help you relax (sedative).  Do not drive or use heavy machinery while taking prescription pain medicine. General instructions   Take over-the-counter and prescription medicines only as told by your doctor.  Return to your normal activities as told. Ask what activities are safe for you.  Do not use any products that have nicotine or tobacco in them. This includes cigarettes and e-cigarettes. If you need help quitting, ask your doctor.  Keep all follow-up visits as told by your doctor. This is important. It is very important if you had a tissue sample (biopsy) taken. Get help right away if:  You have shortness of breath that gets worse.  You get light-headed.  You feel like you are going to pass out (faint).  You have chest pain.  You cough up: ? More than a little blood. ? More blood than before. Summary  Do not eat or drink anything (not even water) for 2 hours after your test, or until your numbing medicine wears off.  Do not use cigarettes. Do not use e-cigarettes.  Get help right away if you have chest pain. This information is not intended to replace advice given to you by your health care provider. Make sure you discuss any questions you have with your health care provider. Document Released: 11/27/2008 Document Revised: 01/12/2017 Document Reviewed: 02/18/2016 Elsevier Patient Education  2020 [employer].

## 2018-12-19 NOTE — Anesthesia Procedure Notes (Signed)
Procedure Name: Intubation Date/Time: 12/19/2018 11:05 AM Performed by: Jenne Campus, CRNA Pre-anesthesia Checklist: Patient identified, Emergency Drugs available, Suction available and Patient being monitored Patient Re-evaluated:Patient Re-evaluated prior to induction Oxygen Delivery Method: Circle System Utilized Preoxygenation: Pre-oxygenation with 100% oxygen Induction Type: IV induction Ventilation: Mask ventilation without difficulty Laryngoscope Size: Miller and 2 Grade View: Grade I Tube type: Oral Tube size: 8.5 mm Number of attempts: 1 Airway Equipment and Method: Stylet and Oral airway Placement Confirmation: ETT inserted through vocal cords under direct vision,  positive ETCO2 and breath sounds checked- equal and bilateral Secured at: 21 cm Tube secured with: Tape Dental Injury: Teeth and Oropharynx as per pre-operative assessment

## 2018-12-20 ENCOUNTER — Telehealth: Payer: Medicare Other

## 2018-12-20 ENCOUNTER — Ambulatory Visit (INDEPENDENT_AMBULATORY_CARE_PROVIDER_SITE_OTHER): Payer: Medicare Other

## 2018-12-20 ENCOUNTER — Encounter (HOSPITAL_COMMUNITY): Payer: Self-pay | Admitting: Pulmonary Disease

## 2018-12-20 DIAGNOSIS — R918 Other nonspecific abnormal finding of lung field: Secondary | ICD-10-CM

## 2018-12-20 DIAGNOSIS — N2581 Secondary hyperparathyroidism of renal origin: Secondary | ICD-10-CM | POA: Diagnosis not present

## 2018-12-20 DIAGNOSIS — Z992 Dependence on renal dialysis: Secondary | ICD-10-CM | POA: Diagnosis not present

## 2018-12-20 DIAGNOSIS — I10 Essential (primary) hypertension: Secondary | ICD-10-CM

## 2018-12-20 DIAGNOSIS — N186 End stage renal disease: Secondary | ICD-10-CM | POA: Diagnosis not present

## 2018-12-20 DIAGNOSIS — D631 Anemia in chronic kidney disease: Secondary | ICD-10-CM | POA: Diagnosis not present

## 2018-12-20 LAB — GASTROINTESTINAL PATHOGEN PANEL PCR
C. difficile Tox A/B, PCR: UNDETERMINED — AB
Campylobacter, PCR: UNDETERMINED — AB
Cryptosporidium, PCR: UNDETERMINED — AB
E coli (ETEC) LT/ST PCR: UNDETERMINED — AB
E coli (STEC) stx1/stx2, PCR: UNDETERMINED — AB
E coli 0157, PCR: UNDETERMINED — AB
Giardia lamblia, PCR: UNDETERMINED — AB
Norovirus, PCR: UNDETERMINED — AB
Rotavirus A, PCR: UNDETERMINED — AB
Salmonella, PCR: UNDETERMINED — AB
Shigella, PCR: UNDETERMINED — AB

## 2018-12-23 DIAGNOSIS — N2581 Secondary hyperparathyroidism of renal origin: Secondary | ICD-10-CM | POA: Diagnosis not present

## 2018-12-23 DIAGNOSIS — N186 End stage renal disease: Secondary | ICD-10-CM | POA: Diagnosis not present

## 2018-12-23 DIAGNOSIS — Z992 Dependence on renal dialysis: Secondary | ICD-10-CM | POA: Diagnosis not present

## 2018-12-23 DIAGNOSIS — D631 Anemia in chronic kidney disease: Secondary | ICD-10-CM | POA: Diagnosis not present

## 2018-12-23 NOTE — Chronic Care Management (AMB) (Signed)
Chronic Care Management   Follow Up Note   12/20/2018 Name: Jessica Clarke MRN: [id number] DOB: Jul 08, 1950  Referred by: Jessica Brine, FNP Reason for referral : Chronic Care Management (CCM RNCM Telephone Follow up )   Jessica Clarke is a 68 y.o. year old female who is a primary care patient of Jessica Clarke, [place]. The CCM team was consulted for assistance with chronic disease management and care coordination needs.    Review of patient status, including review of consultants reports, relevant laboratory and other test results, and collaboration with appropriate care team members and the patient's provider was performed as part of comprehensive patient evaluation and provision of chronic care management services.    SDOH (Social Determinants of Health) screening performed today: None. See Care Plan for related entries.   Outbound call placed to Jessica Clarke daughter Jessica Clarke today for a CCM RN CM follow up.   Outpatient Encounter Medications as of 12/20/2018  Medication Sig Note  . acetaminophen (TYLENOL) 325 MG tablet Take 325-650 mg by mouth every 8 (eight) hours as needed for mild pain.    Marland Kitchen allopurinol (ZYLOPRIM) 100 MG tablet Take 100 mg by mouth daily.     . Amino Acids-Protein Hydrolys (FEEDING SUPPLEMENT, PRO-STAT SUGAR FREE 64,) LIQD Take 30 mLs by mouth 2 (two) times daily. (Patient not taking: Reported on 12/19/2018)   . aspirin EC 325 MG tablet Take 1 tablet (325 mg total) by mouth daily. 12/19/2018: On hold until after procedure  . atenolol (TENORMIN) 100 MG tablet Take 100 mg by mouth daily.    Marland Kitchen atorvastatin (LIPITOR) 10 MG tablet Take 10 mg by mouth at bedtime.    . B Complex-Folic Acid (B COMPLEX FORMULA 1) TABS Take 1 tablet by mouth daily.    . calcitRIOL (ROCALTROL) 0.5 MCG capsule Take 2 capsules (1 mcg total) by mouth every Monday, Wednesday, and Friday with hemodialysis. (Patient not taking: Reported on 12/19/2018)   . cinacalcet (SENSIPAR) 30 MG tablet Take 1 tablet (30  mg total) by mouth daily with supper.   . colchicine 0.6 MG tablet Take 0.6 mg by mouth daily as needed (as directed for gout flares or pain).    . Darbepoetin Alfa (ARANESP) 60 MCG/0.3ML SOSY injection Inject 0.3 mLs (60 mcg total) into the vein every Friday with hemodialysis. (Patient not taking: Reported on 12/19/2018)   . hydrocerin (EUCERIN) CREA Apply 1 application topically 2 (two) times daily.   Marland Kitchen loperamide (IMODIUM) 2 MG capsule Take 2 mg by mouth daily as needed for diarrhea or loose stools.    . midodrine (PROAMATINE) 10 MG tablet Take 1 tablet (10 mg total) by mouth 2 (two) times daily with a meal.   . multivitamin (RENA-VIT) TABS tablet Take 1 tablet by mouth at bedtime.   . nicotine (NICODERM CQ - DOSED IN MG/24 HOURS) 14 mg/24hr patch Place 1 patch (14 mg total) onto the skin daily as needed (smoking craving).   . thiamine (VITAMIN B-1) 100 MG tablet Take 100 mg by mouth daily.   . VELPHORO 500 MG chewable tablet Chew 1,000 mg by mouth 3 (three) times daily with meals.    . Vitamin D, Cholecalciferol, 25 MCG (1000 UT) TABS Take 1,000 Units by mouth daily.     No facility-administered encounter medications on file as of 12/20/2018.      Goals Addressed      Patient Stated   . COMPLETED: "I didn't know my blood count was low again" (pt-stated)  Current Barriers:  Marland Kitchen Knowledge Deficits related to treatment management and Care Coordination for low Hgb and HCT and possible blood transfusion . ESRD . Recent Hx of GI bleed  Nurse Case Manager Clinical Goal(s):  Marland Kitchen Over the next 7 days, patient will verbalize understanding of plan for H&H lab redraw and treatment plan for blood transfusion  COMPLETED . Over the next 30 days, patient will experience no complications and or hospitalizations secondary to Anemia   CCM RN CM Interventions:  12/20/18 call completed with patients daughter Jessica Clarke  . Evaluation of current treatment plan related to low Hgb & HCT and patient's adherence  to plan as established by provider . Discussed the dialysis center continues to monitor weekly Hgb & HCT; discussed patient's H&H has improved slightly without receiving a blood transfusion; Discussed patient continues to receive Epogen q 2 weeks as prescribed by her Nephrologist ; discussed patient continues to be asymptomatic in regards to the low blood count . Discussed plans with patient for ongoing care management follow up and provided patient with direct contact information for care management team  Patient Self Care Activities with assistance of daughter . Self administers medications as prescribed . Attends all scheduled provider appointments . Calls pharmacy for medication refills . Attends church or other social activities . Performs ADL's independently . Performs IADL's independently . Calls provider office for new concerns or questions  Please see past updates related to this goal by clicking on the "Past Updates" button in the selected goal     . "I need a biopsy to determine what medication will help" (pt-stated)       Current Barriers:  . Limited testing to identify lesion in L lung  Clinical Social Work Clinical Goal(s):  Marland Kitchen Over the next 30 days the patient will attend lung biopsy as scheduled on 12/04/18 to assist with tissue identification . Over the next 60 days the patient will become more knowledgeable of diagnosis as evidenced by ability to verbalize treatment plan  CCM RN CM Interventions: 12/20/18 call completed with patients daughter Jessica Clarke  . Evaluation of current treatment plan related to Bronchoscopy for Lung Bx and patient's adherence to plan as established by provider. . Discussed plans with patient for ongoing care management follow up and provided patient with direct contact information for care management team . Reviewed scheduled/upcoming provider appointments including: f/u with  GI, [facility] and PCP Jessica Brine, FNP  Patient Self  Care Activities:  . Self administers medications as prescribed . Attends all scheduled provider appointments . Calls provider office for new concerns or questions  Please see past updates related to this goal by clicking on the "Past Updates" button in the selected goal       Other   . To assist patient with obtaining an Oxygen Concentrator       Current Barriers:  Marland Kitchen Knowledge Deficits related to how to obtain an Oxygen concentrator for continuous Oxygen use . Non adherence to continuous daytime Oxygen therapy  Nurse Case Manager Clinical Goal(s):  Marland Kitchen Over the next 30 days, patient will work with CCM RN and PCP provider to address needs related to obtaining an order and Care Coordination for an Oxygen Concentrator for continuous Oxygen therapy . Over the next 90 days, patient will report 100% of adherence to wearing her Oxygen exactly as prescribed  CCM RN CM Interventions:  12/20/18 call completed with daughter Jessica Clarke   . Evaluation of current treatment plan related to continuous Oxygen therapy and  patient's adherence to plan as established by provider. Nash Dimmer with PCP Jessica Brine, FNP regarding Rx is needed for an Oxygen concentrator; requested the Rx be sent to [facility] and provided the fax # . Discussed plans with patient for ongoing care management follow up and provided patient with direct contact information for care management team  Patient Self Care Activities:  . Self administers medications as prescribed . Attends all scheduled provider appointments . Calls pharmacy for medication refills . Performs ADL's independently . Performs IADL's independently . Calls provider office for new concerns or questions  Please see past updates related to this goal by clicking on the "Past Updates" button in the selected goal         Telephone follow up appointment with care management team member scheduled for: 12/27/18  Barb Merino, RN, BSN, CCM Care Management  Coordinator [facility]  Direct Phone: [phone]

## 2018-12-23 NOTE — Patient Instructions (Signed)
Visit Information  Goals Addressed      Patient Stated   . COMPLETED: "I didn't know my blood count was low again" (pt-stated)       Current Barriers:  Marland Kitchen Knowledge Deficits related to treatment management and Care Coordination for low Hgb and HCT and possible blood transfusion . ESRD . Recent Hx of GI bleed  Nurse Case Manager Clinical Goal(s):  Marland Kitchen Over the next 7 days, patient will verbalize understanding of plan for H&H lab redraw and treatment plan for blood transfusion  COMPLETED . Over the next 30 days, patient will experience no complications and or hospitalizations secondary to Anemia   CCM RN CM Interventions:  12/20/18 call completed with patients daughter [name]  . Evaluation of current treatment plan related to low Hgb & HCT and patient's adherence to plan as established by provider . Discussed the dialysis center continues to monitor weekly Hgb & HCT; discussed patient's H&H has improved slightly without receiving a blood transfusion; Discussed patient continues to receive Epogen q 2 weeks as prescribed by her Nephrologist ; discussed patient continues to be asymptomatic in regards to the low blood count . Discussed plans with patient for ongoing care management follow up and provided patient with direct contact information for care management team  Patient Self Care Activities with assistance of daughter . Self administers medications as prescribed . Attends all scheduled provider appointments . Calls pharmacy for medication refills . Attends church or other social activities . Performs ADL's independently . Performs IADL's independently . Calls provider office for new concerns or questions  Please see past updates related to this goal by clicking on the "Past Updates" button in the selected goal     . "I need a biopsy to determine what medication will help" (pt-stated)       Current Barriers:  . Limited testing to identify lesion in L lung  Clinical Social Work  Clinical Goal(s):  Marland Kitchen Over the next 30 days the patient will attend lung biopsy as scheduled on 12/04/18 to assist with tissue identification . Over the next 60 days the patient will become more knowledgeable of diagnosis as evidenced by ability to verbalize treatment plan  CCM RN CM Interventions: 12/20/18 call completed with patients daughter [name]  . Evaluation of current treatment plan related to Bronchoscopy for Lung Bx and patient's adherence to plan as established by provider. . Discussed plans with patient for ongoing care management follow up and provided patient with direct contact information for care management team . Reviewed scheduled/upcoming provider appointments including: f/u with [facility], [facility] and PCP [name], FNP  Patient Self Care Activities:  . Self administers medications as prescribed . Attends all scheduled provider appointments . Calls provider office for new concerns or questions  Please see past updates related to this goal by clicking on the "Past Updates" button in the selected goal       Other   . To assist patient with obtaining an Oxygen Concentrator       Current Barriers:  Marland Kitchen Knowledge Deficits related to how to obtain an Oxygen concentrator for continuous Oxygen use . Non adherence to continuous daytime Oxygen therapy  Nurse Case Manager Clinical Goal(s):  Marland Kitchen Over the next 30 days, patient will work with CCM RN and PCP provider to address needs related to obtaining an order and Care Coordination for an Oxygen Concentrator for continuous Oxygen therapy . Over the next 90 days, patient will report 100% of adherence to wearing  her Oxygen exactly as prescribed  CCM RN CM Interventions:  12/20/18 call completed with daughter [name]   . Evaluation of current treatment plan related to continuous Oxygen therapy and patient's adherence to plan as established by provider. Nash Dimmer with PCP [name], FNP regarding Rx is needed  for an Oxygen concentrator; requested the Rx be sent to [place] and provided the fax # . Discussed plans with patient for ongoing care management follow up and provided patient with direct contact information for care management team  Patient Self Care Activities:  . Self administers medications as prescribed . Attends all scheduled provider appointments . Calls pharmacy for medication refills . Performs ADL's independently . Performs IADL's independently . Calls provider office for new concerns or questions  Please see past updates related to this goal by clicking on the "Past Updates" button in the selected goal        The patient verbalized understanding of instructions provided today and declined a print copy of patient instruction materials.   Telephone follow up appointment with care management team member scheduled for: 12/27/18  Barb Merino, RN, BSN, CCM Care Management Coordinator [facility]  Direct Phone: [phone]

## 2018-12-24 ENCOUNTER — Telehealth: Payer: Self-pay | Admitting: *Deleted

## 2018-12-24 DIAGNOSIS — K921 Melena: Secondary | ICD-10-CM | POA: Diagnosis not present

## 2018-12-24 DIAGNOSIS — I132 Hypertensive heart and chronic kidney disease with heart failure and with stage 5 chronic kidney disease, or end stage renal disease: Secondary | ICD-10-CM | POA: Diagnosis not present

## 2018-12-24 DIAGNOSIS — N186 End stage renal disease: Secondary | ICD-10-CM | POA: Diagnosis not present

## 2018-12-24 DIAGNOSIS — A040 Enteropathogenic Escherichia coli infection: Secondary | ICD-10-CM | POA: Diagnosis not present

## 2018-12-24 DIAGNOSIS — R918 Other nonspecific abnormal finding of lung field: Secondary | ICD-10-CM

## 2018-12-24 DIAGNOSIS — I5030 Unspecified diastolic (congestive) heart failure: Secondary | ICD-10-CM | POA: Diagnosis not present

## 2018-12-24 DIAGNOSIS — D500 Iron deficiency anemia secondary to blood loss (chronic): Secondary | ICD-10-CM | POA: Diagnosis not present

## 2018-12-24 LAB — CYTOLOGY - NON PAP

## 2018-12-24 NOTE — Telephone Encounter (Signed)
Oncology Nurse Navigator Documentation  Oncology Nurse Navigator Flowsheets 12/24/2018  Abnormal Finding Date 10/22/2018  Confirmed Diagnosis Date 12/19/2018  Diagnosis Status Confirmed Diagnosis Complete  Navigator Follow Up Date: 12/26/2018  Navigator Follow Up Reason: Follow-up Appointment  Navigator Location [facility]  Navigator Encounter Type Telephone/I called Jessica Clarke daughter to get her a follow up appt.  Daughter verbalized understanding of appt time and place this week.    Telephone Outgoing Call  Treatment Phase Pre-Tx/Tx Discussion  Barriers/Navigation Needs Coordination of Care;Education  Education Other  Interventions Coordination of Care;Education  Acuity Level 2-Minimal Needs (1-2 Barriers Identified)  Coordination of Care Appts;Other  Education Method Verbal  Time Spent with Patient 45

## 2018-12-25 ENCOUNTER — Telehealth: Payer: Self-pay | Admitting: Nurse Practitioner

## 2018-12-25 DIAGNOSIS — N2581 Secondary hyperparathyroidism of renal origin: Secondary | ICD-10-CM | POA: Diagnosis not present

## 2018-12-25 DIAGNOSIS — Z992 Dependence on renal dialysis: Secondary | ICD-10-CM | POA: Diagnosis not present

## 2018-12-25 DIAGNOSIS — N186 End stage renal disease: Secondary | ICD-10-CM | POA: Diagnosis not present

## 2018-12-25 DIAGNOSIS — D631 Anemia in chronic kidney disease: Secondary | ICD-10-CM | POA: Diagnosis not present

## 2018-12-25 NOTE — Telephone Encounter (Signed)
Called to schedule AWV. No answer

## 2018-12-26 ENCOUNTER — Inpatient Hospital Stay: Payer: Medicare Other | Attending: Internal Medicine

## 2018-12-26 ENCOUNTER — Inpatient Hospital Stay (HOSPITAL_BASED_OUTPATIENT_CLINIC_OR_DEPARTMENT_OTHER): Payer: Medicare Other | Admitting: Internal Medicine

## 2018-12-26 ENCOUNTER — Other Ambulatory Visit: Payer: Self-pay

## 2018-12-26 ENCOUNTER — Telehealth: Payer: Self-pay

## 2018-12-26 ENCOUNTER — Other Ambulatory Visit: Payer: Self-pay | Admitting: *Deleted

## 2018-12-26 ENCOUNTER — Encounter: Payer: Self-pay | Admitting: Internal Medicine

## 2018-12-26 ENCOUNTER — Encounter: Payer: Self-pay | Admitting: *Deleted

## 2018-12-26 DIAGNOSIS — C3490 Malignant neoplasm of unspecified part of unspecified bronchus or lung: Secondary | ICD-10-CM

## 2018-12-26 DIAGNOSIS — Z992 Dependence on renal dialysis: Secondary | ICD-10-CM | POA: Diagnosis not present

## 2018-12-26 DIAGNOSIS — I5030 Unspecified diastolic (congestive) heart failure: Secondary | ICD-10-CM | POA: Diagnosis not present

## 2018-12-26 DIAGNOSIS — C3411 Malignant neoplasm of upper lobe, right bronchus or lung: Secondary | ICD-10-CM | POA: Diagnosis present

## 2018-12-26 DIAGNOSIS — A040 Enteropathogenic Escherichia coli infection: Secondary | ICD-10-CM | POA: Diagnosis not present

## 2018-12-26 DIAGNOSIS — C3491 Malignant neoplasm of unspecified part of right bronchus or lung: Secondary | ICD-10-CM | POA: Diagnosis not present

## 2018-12-26 DIAGNOSIS — Z7189 Other specified counseling: Secondary | ICD-10-CM | POA: Diagnosis not present

## 2018-12-26 DIAGNOSIS — N186 End stage renal disease: Secondary | ICD-10-CM | POA: Insufficient documentation

## 2018-12-26 DIAGNOSIS — K921 Melena: Secondary | ICD-10-CM | POA: Diagnosis not present

## 2018-12-26 DIAGNOSIS — D500 Iron deficiency anemia secondary to blood loss (chronic): Secondary | ICD-10-CM | POA: Diagnosis not present

## 2018-12-26 DIAGNOSIS — I132 Hypertensive heart and chronic kidney disease with heart failure and with stage 5 chronic kidney disease, or end stage renal disease: Secondary | ICD-10-CM | POA: Diagnosis not present

## 2018-12-26 DIAGNOSIS — R918 Other nonspecific abnormal finding of lung field: Secondary | ICD-10-CM

## 2018-12-26 LAB — CBC WITH DIFFERENTIAL (CANCER CENTER ONLY)
Abs Immature Granulocytes: 0.01 10*3/uL (ref 0.00–0.07)
Basophils Absolute: 0 10*3/uL (ref 0.0–0.1)
Basophils Relative: 1 %
Eosinophils Absolute: 0.1 10*3/uL (ref 0.0–0.5)
Eosinophils Relative: 2 %
HCT: 30.6 % — ABNORMAL LOW (ref 36.0–46.0)
Hemoglobin: 9.8 g/dL — ABNORMAL LOW (ref 12.0–15.0)
Immature Granulocytes: 0 %
Lymphocytes Relative: 18 %
Lymphs Abs: 0.8 10*3/uL (ref 0.7–4.0)
MCH: 32.1 pg (ref 26.0–34.0)
MCHC: 32 g/dL (ref 30.0–36.0)
MCV: 100.3 fL — ABNORMAL HIGH (ref 80.0–100.0)
Monocytes Absolute: 0.5 10*3/uL (ref 0.1–1.0)
Monocytes Relative: 12 %
Neutro Abs: 3 10*3/uL (ref 1.7–7.7)
Neutrophils Relative %: 67 %
Platelet Count: 115 10*3/uL — ABNORMAL LOW (ref 150–400)
RBC: 3.05 MIL/uL — ABNORMAL LOW (ref 3.87–5.11)
RDW: 16 % — ABNORMAL HIGH (ref 11.5–15.5)
WBC Count: 4.5 10*3/uL (ref 4.0–10.5)
nRBC: 0 % (ref 0.0–0.2)

## 2018-12-26 LAB — CMP (CANCER CENTER ONLY)
ALT: 9 U/L (ref 0–44)
AST: 19 U/L (ref 15–41)
Albumin: 2.7 g/dL — ABNORMAL LOW (ref 3.5–5.0)
Alkaline Phosphatase: 203 U/L — ABNORMAL HIGH (ref 38–126)
Anion gap: 10 (ref 5–15)
BUN: 23 mg/dL (ref 8–23)
CO2: 29 mmol/L (ref 22–32)
Calcium: 9 mg/dL (ref 8.9–10.3)
Chloride: 94 mmol/L — ABNORMAL LOW (ref 98–111)
Creatinine: 3.43 mg/dL (ref 0.44–1.00)
GFR, Est AFR Am: 15 mL/min — ABNORMAL LOW (ref >60)
GFR, Estimated: 13 mL/min — ABNORMAL LOW (ref >60)
Glucose, Bld: 83 mg/dL (ref 70–99)
Potassium: 3.8 mmol/L (ref 3.5–5.1)
Sodium: 133 mmol/L — ABNORMAL LOW (ref 135–145)
Total Bilirubin: 0.8 mg/dL (ref 0.3–1.2)
Total Protein: 7.5 g/dL (ref 6.5–8.1)

## 2018-12-26 NOTE — Progress Notes (Signed)
Oncology Nurse Navigator Documentation  Oncology Nurse Navigator Flowsheets 12/26/2018  Abnormal Finding Date -  Confirmed Diagnosis Date -  Diagnosis Status -  Navigator Follow Up Date: -  Navigator Follow Up Reason: -  Navigator Location [facility]  Navigator Encounter Type Clinic/MDC/spoke with patient and daughter today at thoracic clinic.  Gave and explained information on lung cancer and treatment.    Telephone -  [facility] Date 12/26/2018  Multidisiplinary Clinic Type Thoracic  Patient Visit Type MedOnc  Treatment Phase Pre-Tx/Tx Discussion  Barriers/Navigation Needs Education  Education Newly Diagnosed Cancer Education;Other  Interventions Education  Acuity Level 2-Minimal Needs (1-2 Barriers Identified)  Coordination of Care -  Education Method Verbal;Written  Time Spent with Patient 30

## 2018-12-26 NOTE — Telephone Encounter (Signed)
I called patient to see how she is doing I was unable to leave her a v/m. YRL,RMA

## 2018-12-26 NOTE — Progress Notes (Signed)
Per Dr. Julien Nordmann, foundation one and PDL 1 testing requested to pathology dept.

## 2018-12-26 NOTE — Telephone Encounter (Signed)
-----   Message from [name], [place] sent at 12/25/2018  5:18 PM EST ----- Call to check on her and see how she is doing today. ----- Message ----- From: Michelle Nasuti, [employer]: 12/24/2018   4:12 PM EST To: [name], FNP  Gracie the physical therapist left an FYI that the pt was feeling really weak and running a temperature of 99.9 and that all her other vitals were normal.  Gracie said that she advised the pt's family that if the pt starts to get worse to notify her PCP or got to the ER.

## 2018-12-26 NOTE — Progress Notes (Signed)
The proposed treatment plan is for discussion purpose only and is not a binding recommendation.  The patient was not physically examined nor present for their treatment options. Therefore, final treatment plan cannot be decided.

## 2018-12-26 NOTE — Progress Notes (Signed)
[facility] [phone]   [phone]  OFFICE PROGRESS NOTE  [name], [address] [place] [address]  DIAGNOSIS: Stage IIIb/IV (T2a, N3, M0/M1a) non-small cell lung cancer, adenocarcinoma presented with right upper lobe lung mass in addition to mediastinal and right supraclavicular lymphadenopathy and suspicious right pleural effusion diagnosed in November 2020.   PRIOR THERAPY: None  CURRENT THERAPY: None  INTERVAL HISTORY: Jessica Clarke 68 y.o. female returns to the clinic today for follow-up visit accompanied by her daughter.  The patient is feeling fine today with no concerning complaints except for the fatigue.  She is currently on dialysis on Monday, Wednesday and Friday weekly.  She denied having any current chest pain, shortness of breath, cough or hemoptysis.  She denied having any fever or chills.  She has no nausea, vomiting, diarrhea or constipation.  She has no headache or visual changes.  She was recently seen by Dr. Valeta Harms and underwent video bronchoscopy with endobronchial ultrasound on December 19, 2018.  The final pathology ([id number] ) showed metastatic adenocarcinoma.The neoplastic cells are positive for TTF-1 but negative for cytokeratin  5/6 and p40. Overall, the features are consistent with a clinical impression of metastatic lung adenocarcinoma.  The tissue block was sent to [facility] for molecular studies.  The patient is here today for evaluation and recommendation regarding treatment of her condition.  MEDICAL HISTORY: Past Medical History:  Diagnosis Date  . Abnormal Pap smear    ASCUS ?HGSIL  . Anemia   . Arthritis   . Cancer ([place])    lung ( per daughter)  . Cervical dysplasia   . CHF (congestive heart failure) ([place])   . Chronic kidney disease   . Constipation   . Constipation   . Dyspnea   . GERD (gastroesophageal reflux disease)   . Gout   . Hyperlipidemia   . Hypertension   .  Myocardial infarction ([place]) 2015  . Obesity   . Pneumonia   . Uterine polyp     ALLERGIES:  has No Known Allergies.  MEDICATIONS:  Current Outpatient Medications  Medication Sig Dispense Refill  . acetaminophen (TYLENOL) 325 MG tablet Take 325-650 mg by mouth every 8 (eight) hours as needed for mild pain.     Marland Kitchen allopurinol (ZYLOPRIM) 100 MG tablet Take 100 mg by mouth daily.      . Amino Acids-Protein Hydrolys (FEEDING SUPPLEMENT, PRO-STAT SUGAR FREE 64,) LIQD Take 30 mLs by mouth 2 (two) times daily. (Patient not taking: Reported on 12/19/2018) 887 mL 0  . aspirin EC 325 MG tablet Take 1 tablet (325 mg total) by mouth daily. 30 tablet 0  . atenolol (TENORMIN) 100 MG tablet Take 100 mg by mouth daily.     Marland Kitchen atorvastatin (LIPITOR) 10 MG tablet Take 10 mg by mouth at bedtime.   0  . B Complex-Folic Acid (B COMPLEX FORMULA 1) TABS Take 1 tablet by mouth daily.     . calcitRIOL (ROCALTROL) 0.5 MCG capsule Take 2 capsules (1 mcg total) by mouth every Monday, Wednesday, and Friday with hemodialysis. (Patient not taking: Reported on 12/19/2018) 30 capsule 0  . cinacalcet (SENSIPAR) 30 MG tablet Take 1 tablet (30 mg total) by mouth daily with supper. 60 tablet 0  . colchicine 0.6 MG tablet Take 0.6 mg by mouth daily as needed (as directed for gout flares or pain).     . Darbepoetin Alfa (ARANESP) 60 MCG/0.3ML SOSY injection Inject 0.3  mLs (60 mcg total) into the vein every Friday with hemodialysis. (Patient not taking: Reported on 12/19/2018) 4.2 mL   . hydrocerin (EUCERIN) CREA Apply 1 application topically 2 (two) times daily. 228 g 0  . loperamide (IMODIUM) 2 MG capsule Take 2 mg by mouth daily as needed for diarrhea or loose stools.     . midodrine (PROAMATINE) 10 MG tablet Take 1 tablet (10 mg total) by mouth 2 (two) times daily with a meal. 60 tablet 0  . multivitamin (RENA-VIT) TABS tablet Take 1 tablet by mouth at bedtime. 30 tablet 0  . nicotine (NICODERM CQ - DOSED IN MG/24 HOURS) 14 mg/24hr  patch Place 1 patch (14 mg total) onto the skin daily as needed (smoking craving). 28 patch 0  . thiamine (VITAMIN B-1) 100 MG tablet Take 100 mg by mouth daily.    . VELPHORO 500 MG chewable tablet Chew 1,000 mg by mouth 3 (three) times daily with meals.     . Vitamin D, Cholecalciferol, 25 MCG (1000 UT) TABS Take 1,000 Units by mouth daily.      No current facility-administered medications for this visit.     SURGICAL HISTORY:  Past Surgical History:  Procedure Laterality Date  . AV FISTULA PLACEMENT  01/18/2012   Procedure: ARTERIOVENOUS (AV) FISTULA CREATION;  Surgeon: Angelia Mould, MD;  Location: [facility];  Service: Vascular;  Laterality: Left;  . BIOPSY  10/20/2018   Procedure: BIOPSY;  Surgeon: Thornton Park, MD;  Location: [facility];  Service: Gastroenterology;;  . COLONOSCOPY WITH PROPOFOL N/A 10/20/2018   Procedure: COLONOSCOPY WITH PROPOFOL;  Surgeon: Thornton Park, MD;  Location: [facility];  Service: Gastroenterology;  Laterality: N/A;  . DILATION AND CURETTAGE OF UTERUS    . HYSTEROSCOPY    . LEEP    . LEFT HEART CATHETERIZATION WITH CORONARY ANGIOGRAM N/A 08/26/2013   Procedure: LEFT HEART CATHETERIZATION WITH CORONARY ANGIOGRAM;  Surgeon: Leonie Man, MD;  Location: [facility] CATH LAB;  Service: Cardiovascular;  Laterality: N/A;  . POLYPECTOMY  10/20/2018   Procedure: POLYPECTOMY;  Surgeon: Thornton Park, MD;  Location: [facility] ENDOSCOPY;  Service: Gastroenterology;;  . REVISON OF ARTERIOVENOUS FISTULA Left 5/00/9381   Procedure: PLICATION OF ARTERIOVENOUS FISTULA;  Surgeon: Conrad [place] Washington, MD;  Location: [facility];  Service: Vascular;  Laterality: Left;  Marland Kitchen VIDEO BRONCHOSCOPY WITH ENDOBRONCHIAL ULTRASOUND N/A 12/19/2018   Procedure: VIDEO BRONCHOSCOPY WITH ENDOBRONCHIAL ULTRASOUND;  Surgeon: Garner Nash, DO;  Location: [facility] OR;  Service: Thoracic;  Laterality: N/A;    REVIEW OF SYSTEMS:  Constitutional: positive for fatigue Eyes: negative Ears, nose, mouth, throat,  and face: negative Respiratory: positive for dyspnea on exertion Cardiovascular: negative Gastrointestinal: negative Genitourinary:negative Integument/breast: negative Hematologic/lymphatic: negative Musculoskeletal:positive for muscle weakness Neurological: negative Behavioral/Psych: negative Endocrine: negative Allergic/Immunologic: negative   PHYSICAL EXAMINATION: General appearance: alert, cooperative, fatigued and no distress Head: Normocephalic, without obvious abnormality, atraumatic Neck: no adenopathy, no JVD, supple, symmetrical, trachea midline and thyroid not enlarged, symmetric, no tenderness/mass/nodules Lymph nodes: Cervical, supraclavicular, and axillary nodes normal. Resp: clear to auscultation bilaterally Back: symmetric, no curvature. ROM normal. No CVA tenderness. Cardio: regular rate and rhythm, S1, S2 normal, no murmur, click, rub or gallop GI: soft, non-tender; bowel sounds normal; no masses,  no organomegaly Extremities: extremities normal, atraumatic, no cyanosis or edema Neurologic: Alert and oriented X 3, normal strength and tone. Normal symmetric reflexes. Normal coordination and gait  ECOG PERFORMANCE STATUS: 1 - Symptomatic but completely ambulatory  Blood pressure 125/64, pulse 67, temperature  98.9 F (37.2 C), temperature source Temporal, resp. rate 17, height 5\' 10"  (1.778 m), weight 153 lb 1.6 oz (69.4 kg), SpO2 92 %.  LABORATORY DATA: Lab Results  Component Value Date   WBC 4.5 12/26/2018   HGB 9.8 (L) 12/26/2018   HCT 30.6 (L) 12/26/2018   MCV 100.3 (H) 12/26/2018   PLT 115 (L) 12/26/2018      Chemistry      Component Value Date/Time   NA 131 (L) 12/19/2018 0900   NA 136 11/19/2018 1640   K 3.6 12/19/2018 0900   CL 94 (L) 12/19/2018 0900   CO2 26 12/19/2018 0900   BUN 17 12/19/2018 0900   BUN 19 11/19/2018 1640   CREATININE 3.61 (H) 12/19/2018 0900   CREATININE 4.50 (HH) 10/28/2018 1347      Component Value Date/Time   CALCIUM  9.3 12/19/2018 0900   CALCIUM 9.0 10/22/2009 2253   ALKPHOS 166 (H) 12/19/2018 0900   AST 23 12/19/2018 0900   AST 22 10/28/2018 1347   ALT 12 12/19/2018 0900   ALT 9 10/28/2018 1347   BILITOT 0.8 12/19/2018 0900   BILITOT 0.5 10/28/2018 1347       RADIOGRAPHIC STUDIES: No results found.  ASSESSMENT AND PLAN: This is a very pleasant 68 years old African-American female with at least stage IIIc/IV (T2a, N3, M0/M1a) presented with right upper lobe lung mass in addition to mediastinal and right supraclavicular lymphadenopathy and suspicious right pleural effusion diagnosed in November 2020. I had a lengthy discussion with the patient and her daughter today about her current condition and treatment options. It is not clear if the patient has any evidence of metastatic disease at this point because her last PET scan was performed in January 2020 before her diagnosis. I recommended for the patient to have repeat PET scan next week for further evaluation of her condition. If the PET scan is negative for metastatic disease outside the chest, will consider the patient for ultrasound-guided right thoracentesis to rule out the presence of malignant pleural effusion. If the pleural fluid is negative for malignancy and no evidence of metastatic disease outside the chest, the patient will be treated for stage IIIc non-small cell lung cancer with concurrent chemoradiation with weekly carboplatin and paclitaxel. I will arrange for the patient to come back for follow-up visit in 2 weeks for evaluation and more detailed discussion of her treatment options after the staging work-up. For the end-stage renal disease, she is currently on hemodialysis Monday, Wednesday and Friday by Dr. Justin Mend. The patient was advised to call immediately if she has any concerning symptoms in the interval. The patient voices understanding of current disease status and treatment options and is in agreement with the current care plan.   All questions were answered. The patient knows to call the clinic with any problems, questions or concerns. We can certainly see the patient much sooner if necessary.  I spent 20 minutes counseling the patient face to face. The total time spent in the appointment was 30 minutes.  Disclaimer: This note was dictated with voice recognition software. Similar sounding words can inadvertently be transcribed and may not be corrected upon review.

## 2018-12-26 NOTE — Patient Instructions (Signed)
Steps to Quit Smoking Smoking tobacco is the leading cause of preventable death. It can affect almost every organ in the body. Smoking puts you and people around you at risk for many serious, long-lasting (chronic) diseases. Quitting smoking can be hard, but it is one of the best things that you can do for your health. It is never too late to quit. How do I get ready to quit? When you decide to quit smoking, make a plan to help you succeed. Before you quit:  Pick a date to quit. Set a date within the next 2 weeks to give you time to prepare.  Write down the reasons why you are quitting. Keep this list in places where you will see it often.  Tell your family, friends, and co-workers that you are quitting. Their support is important.  Talk with your doctor about the choices that may help you quit.  Find out if your health insurance will pay for these treatments.  Know the people, places, things, and activities that make you want to smoke (triggers). Avoid them. What first steps can I take to quit smoking?  Throw away all cigarettes at home, at work, and in your car.  Throw away the things that you use when you smoke, such as ashtrays and lighters.  Clean your car. Make sure to empty the ashtray.  Clean your home, including curtains and carpets. What can I do to help me quit smoking? Talk with your doctor about taking medicines and seeing a counselor at the same time. You are more likely to succeed when you do both.  If you are pregnant or breastfeeding, talk with your doctor about counseling or other ways to quit smoking. Do not take medicine to help you quit smoking unless your doctor tells you to do so. To quit smoking: Quit right away  Quit smoking totally, instead of slowly cutting back on how much you smoke over a period of time.  Go to counseling. You are more likely to quit if you go to counseling sessions regularly. Take medicine You may take medicines to help you quit. Some  medicines need a prescription, and some you can buy over-the-counter. Some medicines may contain a drug called nicotine to replace the nicotine in cigarettes. Medicines may:  Help you to stop having the desire to smoke (cravings).  Help to stop the problems that come when you stop smoking (withdrawal symptoms). Your doctor may ask you to use:  Nicotine patches, gum, or lozenges.  Nicotine inhalers or sprays.  Non-nicotine medicine that is taken by mouth. Find resources Find resources and other ways to help you quit smoking and remain smoke-free after you quit. These resources are most helpful when you use them often. They include:  Online chats with a counselor.  Phone quitlines.  Printed self-help materials.  Support groups or group counseling.  Text messaging programs.  Mobile phone apps. Use apps on your mobile phone or tablet that can help you stick to your quit plan. There are many free apps for mobile phones and tablets as well as websites. Examples include Quit Guide from the CDC and smokefree.gov  What things can I do to make it easier to quit?   Talk to your family and friends. Ask them to support and encourage you.  Call a phone quitline (1-800-QUIT-NOW), reach out to support groups, or work with a counselor.  Ask people who smoke to not smoke around you.  Avoid places that make you want to smoke,   such as: ? Bars. ? Parties. ? Smoke-break areas at work.  Spend time with people who do not smoke.  Lower the stress in your life. Stress can make you want to smoke. Try these things to help your stress: ? Getting regular exercise. ? Doing deep-breathing exercises. ? Doing yoga. ? Meditating. ? Doing a body scan. To do this, close your eyes, focus on one area of your body at a time from head to toe. Notice which parts of your body are tense. Try to relax the muscles in those areas. How will I feel when I quit smoking? Day 1 to 3 weeks Within the first 24 hours,  you may start to have some problems that come from quitting tobacco. These problems are very bad 2-3 days after you quit, but they do not often last for more than 2-3 weeks. You may get these symptoms:  Mood swings.  Feeling restless, nervous, angry, or annoyed.  Trouble concentrating.  Dizziness.  Strong desire for high-sugar foods and nicotine.  Weight gain.  Trouble pooping (constipation).  Feeling like you may vomit (nausea).  Coughing or a sore throat.  Changes in how the medicines that you take for other issues work in your body.  Depression.  Trouble sleeping (insomnia). Week 3 and afterward After the first 2-3 weeks of quitting, you may start to notice more positive results, such as:  Better sense of smell and taste.  Less coughing and sore throat.  Slower heart rate.  Lower blood pressure.  Clearer skin.  Better breathing.  Fewer sick days. Quitting smoking can be hard. Do not give up if you fail the first time. Some people need to try a few times before they succeed. Do your best to stick to your quit plan, and talk with your doctor if you have any questions or concerns. Summary  Smoking tobacco is the leading cause of preventable death. Quitting smoking can be hard, but it is one of the best things that you can do for your health.  When you decide to quit smoking, make a plan to help you succeed.  Quit smoking right away, not slowly over a period of time.  When you start quitting, seek help from your doctor, family, or friends. This information is not intended to replace advice given to you by your health care provider. Make sure you discuss any questions you have with your health care provider. Document Released: 11/26/2008 Document Revised: 04/19/2018 Document Reviewed: 04/20/2018 Elsevier Patient Education  2020 Elsevier Inc.  

## 2018-12-26 NOTE — Telephone Encounter (Signed)
Lab called with creatinine result of 3.43. Dr. Julien Nordmann currently with another patient. Dr. Worthy Flank nurse Rosine Abe, RN made aware and stated she will make Dr. Julien Nordmann aware.

## 2018-12-26 NOTE — Progress Notes (Signed)
Oncology Nurse Navigator Documentation  Oncology Nurse Navigator Flowsheets 12/24/2018  Abnormal Finding Date 10/22/2018  Confirmed Diagnosis Date 12/19/2018  Diagnosis Status Confirmed Diagnosis Complete  Navigator Follow Up Date: 12/26/2018  Navigator Follow Up Reason: Follow-up Appointment  Navigator Location [facility]  Navigator Encounter Type Telephone  Telephone Outgoing Call  Treatment Phase Pre-Tx/Tx Discussion  Barriers/Navigation Needs Coordination of Care;Education  Education Other  Interventions Coordination of Care;Education  Acuity Level 2-Minimal Needs (1-2 Barriers Identified)  Coordination of Care Appts;Other  Education Method Verbal  Time Spent with Patient 45

## 2018-12-27 ENCOUNTER — Other Ambulatory Visit: Payer: Medicare Other

## 2018-12-27 ENCOUNTER — Telehealth: Payer: Medicare Other

## 2018-12-27 ENCOUNTER — Ambulatory Visit (INDEPENDENT_AMBULATORY_CARE_PROVIDER_SITE_OTHER): Payer: Medicare Other | Admitting: Nurse Practitioner

## 2018-12-27 ENCOUNTER — Ambulatory Visit: Payer: Self-pay

## 2018-12-27 ENCOUNTER — Encounter: Payer: Self-pay | Admitting: Nurse Practitioner

## 2018-12-27 ENCOUNTER — Telehealth: Payer: Self-pay | Admitting: Internal Medicine

## 2018-12-27 ENCOUNTER — Encounter: Payer: Self-pay | Admitting: *Deleted

## 2018-12-27 VITALS — BP 116/64 | HR 64 | Temp 97.9°F | Ht 68.0 in | Wt 156.4 lb

## 2018-12-27 DIAGNOSIS — R195 Other fecal abnormalities: Secondary | ICD-10-CM

## 2018-12-27 DIAGNOSIS — R918 Other nonspecific abnormal finding of lung field: Secondary | ICD-10-CM

## 2018-12-27 DIAGNOSIS — N186 End stage renal disease: Secondary | ICD-10-CM

## 2018-12-27 DIAGNOSIS — R140 Abdominal distension (gaseous): Secondary | ICD-10-CM

## 2018-12-27 DIAGNOSIS — I10 Essential (primary) hypertension: Secondary | ICD-10-CM

## 2018-12-27 DIAGNOSIS — Z992 Dependence on renal dialysis: Secondary | ICD-10-CM

## 2018-12-27 NOTE — Progress Notes (Signed)
Chief Complaint:    Follow up  IMPRESSION and PLAN:     12. 68 year old female with stage IIIb/IV ( T2a, N3, MOM1a) non-small lung cancer hospitalized in September with enteropathic E. Coli / anemia, received three uPRBC. Hgb improved but declined again following discharge, requiring 2 additional uPRBC on 10/29/18 ( in absence of overt bleeding).  Hgb has since stabilized at 9.8. Stools dark iron today but heme negative.    2. Persistent loose stools / bloating. Imodium causes constipation, even once daily dosing. -Stool studies obtained at last visit for call deemed indeterminate.  We will repeat GI pathogen panel -Trial of FD guard for bloating  3. History of colon polyps, due for colonoscopy twenty twenty-seven (if medically appropriate)  HPI:     Patient is a 68 year old female with multiple medical problems not limited to HTN, ESRD on HD, and recently diagnosed stage IIIb/IV ( T2a, N3, MOM1a) non-small lung cancer hospitalized in September with enteropathic E. Coli and severe anemia.  Inpatient colonoscopy showed friable patches of mucosa throughout the colon but biopsies were negative.  Following inpatient blood transfusion her hemoglobin improved but dropped again 8.5 to 6.0.  I saw her in clinic mid-September, repeated labs and hemoglobin was stable at 7.8.    Patient came back for another follow-up on 12/09/18.  We repeated labs , stool studies, added Metamucil.  Hemoglobin was better at 9.2.  Stool studies were all indeterminate for unclear reasons. She cannot tolerate Imodium ( not even one dose) as it causes constipation. Still having loose stools 3-4 times a day and complains of bloating.   Review of systems:     No chest pain, no SOB, no fevers, no urinary sx   Past Medical History:  Diagnosis Date  . Abnormal Pap smear    ASCUS ?HGSIL  . Anemia   . Arthritis   . Cancer ([place])    lung ( per daughter)  . Cervical dysplasia   . CHF (congestive heart failure) ([place])    . Chronic kidney disease   . Constipation   . Dyspnea   . GERD (gastroesophageal reflux disease)   . Gout   . Hyperlipidemia   . Hypertension   . Myocardial infarction ([place]) 2015  . Obesity   . Pneumonia   . Uterine polyp     Patient's surgical history, family medical history, social history, medications and allergies were all reviewed in Epic   Serum creatinine: 3.43 mg/dL [facility]) 12/26/18 1339 Estimated creatinine clearance: 16.7 mL/min (A)  Current Outpatient Medications  Medication Sig Dispense Refill  . acetaminophen (TYLENOL) 325 MG tablet Take 325-650 mg by mouth every 8 (eight) hours as needed for mild pain.     Marland Kitchen allopurinol (ZYLOPRIM) 100 MG tablet Take 100 mg by mouth daily.      Marland Kitchen atenolol (TENORMIN) 100 MG tablet Take 100 mg by mouth daily.     Marland Kitchen atorvastatin (LIPITOR) 10 MG tablet Take 10 mg by mouth at bedtime.   0  . B Complex-Folic Acid (B COMPLEX FORMULA 1) TABS Take 1 tablet by mouth daily.     . cinacalcet (SENSIPAR) 30 MG tablet Take 1 tablet (30 mg total) by mouth daily with supper. 60 tablet 0  . colchicine 0.6 MG tablet Take 0.6 mg by mouth daily as needed (as directed for gout flares or pain).     . hydrocerin (EUCERIN) CREA Apply 1 application topically 2 (two) times daily. 228 g 0  .  loperamide (IMODIUM) 2 MG capsule Take 2 mg by mouth daily as needed for diarrhea or loose stools.     . midodrine (PROAMATINE) 10 MG tablet Take 1 tablet (10 mg total) by mouth 2 (two) times daily with a meal. 60 tablet 0  . multivitamin (RENA-VIT) TABS tablet Take 1 tablet by mouth at bedtime. 30 tablet 0  . nicotine (NICODERM CQ - DOSED IN MG/24 HOURS) 14 mg/24hr patch Place 1 patch (14 mg total) onto the skin daily as needed (smoking craving). 28 patch 0  . thiamine (VITAMIN B-1) 100 MG tablet Take 100 mg by mouth daily.    . VELPHORO 500 MG chewable tablet Chew 1,000 mg by mouth 3 (three) times daily with meals.     . Vitamin D, Cholecalciferol, 25 MCG (1000 UT) TABS Take  1,000 Units by mouth daily.     . Amino Acids-Protein Hydrolys (FEEDING SUPPLEMENT, PRO-STAT SUGAR FREE 64,) LIQD Take 30 mLs by mouth 2 (two) times daily. (Patient not taking: Reported on 12/19/2018) 887 mL 0  . aspirin EC 325 MG tablet Take 1 tablet (325 mg total) by mouth daily. (Patient not taking: Reported on 12/27/2018) 30 tablet 0  . calcitRIOL (ROCALTROL) 0.5 MCG capsule Take 2 capsules (1 mcg total) by mouth every Monday, Wednesday, and Friday with hemodialysis. (Patient not taking: Reported on 12/19/2018) 30 capsule 0  . Darbepoetin Alfa (ARANESP) 60 MCG/0.3ML SOSY injection Inject 0.3 mLs (60 mcg total) into the vein every Friday with hemodialysis. (Patient not taking: Reported on 12/19/2018) 4.2 mL    No current facility-administered medications for this visit.     Physical Exam:     There were no vitals taken for this visit.  GENERAL:  Pleasant female in NAD PSYCH: : Cooperative, normal affect EENT:  conjunctiva pink, mucous membranes moist, neck supple without masses CARDIAC:  RRR,  no peripheral edema PULM: Normal respiratory effort, lungs CTA bilaterally, no wheezing ABDOMEN:  Nondistended, soft, nontender. No obvious masses, no hepatomegaly,  normal bowel sounds SKIN:  turgor, no lesions seen Musculoskeletal:  Normal muscle tone, normal strength NEURO: Alert and oriented x 3, no focal neurologic deficits   Tye Savoy , NP 12/27/2018, 1:45 PM

## 2018-12-27 NOTE — Telephone Encounter (Signed)
Scheduled per los. Called, no answer, not able to leave message. Mailed printout

## 2018-12-27 NOTE — Patient Instructions (Signed)
If you are age 70 or older, your body mass index should be between 23-30. Your Body mass index is 23.78 kg/m. If this is out of the aforementioned range listed, please consider follow up with your Primary Care Provider.  If you are age 11 or younger, your body mass index should be between 19-25. Your Body mass index is 23.78 kg/m. If this is out of the aformentioned range listed, please consider follow up with your Primary Care Provider.   Your provider has requested that you go to the basement level for lab work before leaving today. Press "B" on the elevator. The lab is located at the first door on the left as you exit the elevator.  You have been given samples of IBgard for bloating.  You can purchase this over-the-counter.  We will call you with results.  Thank you for choosing me and [facility].   Tye Savoy, NP

## 2018-12-27 NOTE — Progress Notes (Signed)
Oncology Nurse Navigator Documentation  Oncology Nurse Navigator Flowsheets 12/27/2018  Abnormal Finding Date -  Confirmed Diagnosis Date -  Diagnosis Status -  Navigator Follow Up Date: -  Navigator Follow Up Reason: -  Navigator Location [facility]  Navigator Encounter Type Other/I followed up on Jessica Clarke's PET scan.  This is scheduled for 01/07/19.  I updated scheduling to call and schedule a follow up with Jessica Clarke or [name] one or two days after this.    Telephone -  [facility] Date -  Multidisiplinary Clinic Type -  Patient Visit Type -  Treatment Phase Pre-Tx/Tx Discussion  Barriers/Navigation Needs Coordination of Care  Education -  Interventions Coordination of Care  Acuity Level 2-Minimal Needs (1-2 Barriers Identified)  Coordination of Care Other  Education Method -  Time Spent with Patient 15

## 2018-12-28 DIAGNOSIS — I959 Hypotension, unspecified: Secondary | ICD-10-CM | POA: Diagnosis not present

## 2018-12-28 DIAGNOSIS — I132 Hypertensive heart and chronic kidney disease with heart failure and with stage 5 chronic kidney disease, or end stage renal disease: Secondary | ICD-10-CM | POA: Diagnosis not present

## 2018-12-28 DIAGNOSIS — K921 Melena: Secondary | ICD-10-CM | POA: Diagnosis not present

## 2018-12-28 DIAGNOSIS — E669 Obesity, unspecified: Secondary | ICD-10-CM

## 2018-12-28 DIAGNOSIS — F17210 Nicotine dependence, cigarettes, uncomplicated: Secondary | ICD-10-CM

## 2018-12-28 DIAGNOSIS — I252 Old myocardial infarction: Secondary | ICD-10-CM

## 2018-12-28 DIAGNOSIS — E785 Hyperlipidemia, unspecified: Secondary | ICD-10-CM

## 2018-12-28 DIAGNOSIS — K219 Gastro-esophageal reflux disease without esophagitis: Secondary | ICD-10-CM | POA: Diagnosis not present

## 2018-12-28 DIAGNOSIS — M109 Gout, unspecified: Secondary | ICD-10-CM

## 2018-12-28 DIAGNOSIS — D500 Iron deficiency anemia secondary to blood loss (chronic): Secondary | ICD-10-CM | POA: Diagnosis not present

## 2018-12-28 DIAGNOSIS — Z9181 History of falling: Secondary | ICD-10-CM | POA: Diagnosis not present

## 2018-12-28 DIAGNOSIS — Z85118 Personal history of other malignant neoplasm of bronchus and lung: Secondary | ICD-10-CM | POA: Diagnosis not present

## 2018-12-28 DIAGNOSIS — I2510 Atherosclerotic heart disease of native coronary artery without angina pectoris: Secondary | ICD-10-CM | POA: Diagnosis not present

## 2018-12-28 DIAGNOSIS — Z86010 Personal history of colonic polyps: Secondary | ICD-10-CM | POA: Diagnosis not present

## 2018-12-28 DIAGNOSIS — K5790 Diverticulosis of intestine, part unspecified, without perforation or abscess without bleeding: Secondary | ICD-10-CM | POA: Diagnosis not present

## 2018-12-28 DIAGNOSIS — A040 Enteropathogenic Escherichia coli infection: Secondary | ICD-10-CM | POA: Diagnosis not present

## 2018-12-28 DIAGNOSIS — Z6824 Body mass index (BMI) 24.0-24.9, adult: Secondary | ICD-10-CM | POA: Diagnosis not present

## 2018-12-28 DIAGNOSIS — N186 End stage renal disease: Secondary | ICD-10-CM

## 2018-12-28 DIAGNOSIS — Z992 Dependence on renal dialysis: Secondary | ICD-10-CM | POA: Diagnosis not present

## 2018-12-28 DIAGNOSIS — I5030 Unspecified diastolic (congestive) heart failure: Secondary | ICD-10-CM | POA: Diagnosis not present

## 2018-12-30 ENCOUNTER — Ambulatory Visit: Payer: Self-pay

## 2018-12-30 ENCOUNTER — Telehealth: Payer: Self-pay | Admitting: Licensed Clinical Social Worker

## 2018-12-30 DIAGNOSIS — I10 Essential (primary) hypertension: Secondary | ICD-10-CM

## 2018-12-30 DIAGNOSIS — N2581 Secondary hyperparathyroidism of renal origin: Secondary | ICD-10-CM | POA: Diagnosis not present

## 2018-12-30 DIAGNOSIS — N186 End stage renal disease: Secondary | ICD-10-CM | POA: Diagnosis not present

## 2018-12-30 DIAGNOSIS — D631 Anemia in chronic kidney disease: Secondary | ICD-10-CM | POA: Diagnosis not present

## 2018-12-30 DIAGNOSIS — Z992 Dependence on renal dialysis: Secondary | ICD-10-CM | POA: Diagnosis not present

## 2018-12-30 NOTE — Chronic Care Management (AMB) (Signed)
Chronic Care Management   Follow Up Note   12/27/2018 Name: Glady Ouderkirk MRN: [id number] DOB: 21-May-1950  Referred by: [name], FNP Reason for referral : Chronic Care Management (CCM RNCM Telephone Follow up)   Turner Kunzman is a 68 y.o. year old female who is a primary care patient of [name], [place]. The CCM team was consulted for assistance with chronic disease management and care coordination needs.    Review of patient status, including review of consultants reports, relevant laboratory and other test results, and collaboration with appropriate care team members and the patient's provider was performed as part of comprehensive patient evaluation and provision of chronic care management services.    SDOH (Social Determinants of Health) screening performed today: Stress None. See Care Plan for related entries.   Placed outbound call to patient's daughter [name] for a CCM follow up.   Outpatient Encounter Medications as of 12/27/2018  Medication Sig Note  . acetaminophen (TYLENOL) 325 MG tablet Take 325-650 mg by mouth every 8 (eight) hours as needed for mild pain.    Marland Kitchen allopurinol (ZYLOPRIM) 100 MG tablet Take 100 mg by mouth daily.     . Amino Acids-Protein Hydrolys (FEEDING SUPPLEMENT, PRO-STAT SUGAR FREE 64,) LIQD Take 30 mLs by mouth 2 (two) times daily. (Patient not taking: Reported on 12/19/2018)   . aspirin EC 325 MG tablet Take 1 tablet (325 mg total) by mouth daily. (Patient not taking: Reported on 12/27/2018) 12/19/2018: On hold until after procedure  . atenolol (TENORMIN) 100 MG tablet Take 100 mg by mouth daily.    Marland Kitchen atorvastatin (LIPITOR) 10 MG tablet Take 10 mg by mouth at bedtime.    . B Complex-Folic Acid (B COMPLEX FORMULA 1) TABS Take 1 tablet by mouth daily.    . calcitRIOL (ROCALTROL) 0.5 MCG capsule Take 2 capsules (1 mcg total) by mouth every Monday, Wednesday, and Friday with hemodialysis. (Patient not taking: Reported on 12/19/2018)   . cinacalcet  (SENSIPAR) 30 MG tablet Take 1 tablet (30 mg total) by mouth daily with supper.   . colchicine 0.6 MG tablet Take 0.6 mg by mouth daily as needed (as directed for gout flares or pain).    . Darbepoetin Alfa (ARANESP) 60 MCG/0.3ML SOSY injection Inject 0.3 mLs (60 mcg total) into the vein every Friday with hemodialysis. (Patient not taking: Reported on 12/19/2018)   . hydrocerin (EUCERIN) CREA Apply 1 application topically 2 (two) times daily.   Marland Kitchen loperamide (IMODIUM) 2 MG capsule Take 2 mg by mouth daily as needed for diarrhea or loose stools.    . midodrine (PROAMATINE) 10 MG tablet Take 1 tablet (10 mg total) by mouth 2 (two) times daily with a meal.   . multivitamin (RENA-VIT) TABS tablet Take 1 tablet by mouth at bedtime.   . nicotine (NICODERM CQ - DOSED IN MG/24 HOURS) 14 mg/24hr patch Place 1 patch (14 mg total) onto the skin daily as needed (smoking craving).   . thiamine (VITAMIN B-1) 100 MG tablet Take 100 mg by mouth daily.   . VELPHORO 500 MG chewable tablet Chew 1,000 mg by mouth 3 (three) times daily with meals.    . Vitamin D, Cholecalciferol, 25 MCG (1000 UT) TABS Take 1,000 Units by mouth daily.     No facility-administered encounter medications on file as of 12/27/2018.      Goals Addressed      Patient Stated   . "I need a biopsy to determine what medication will help" (pt-stated)  Current Barriers:  . Limited testing to identify lesion in L lung  Clinical Social Work Clinical Goal(s):  Marland Kitchen Over the next 30 days the patient will attend lung biopsy as scheduled on 12/04/18 to assist with tissue identification . Over the next 60 days the patient will become more knowledgeable of diagnosis as evidenced by ability to verbalize treatment plan  CCM RN CM Interventions: 12/27/18 call completed with patients daughter [name]  . Evaluation of current treatment plan related to Bronchoscopy for Lung Bx and patient's adherence to plan as established by provider . 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 Reviewed and discussed the following Oncology impression and plan as noted; o "It is not clear if the patient has any evidence of metastatic disease at this point because her last PET scan was performed in January 2020 before her diagnosis. o I recommended for the patient to have repeat PET scan next week for further evaluation of her condition. o If the PET scan is negative for metastatic disease outside the chest, will consider the patient for ultrasound-guided right thoracentesis to rule out the presence of malignant pleural effusion. o If the pleural fluid is negative for malignancy and no evidence of metastatic disease outside the chest, the patient will be treated for stage IIIc non-small cell lung cancer with concurrent chemoradiation with weekly carboplatin and paclitaxel. o I will arrange for the patient to come back for follow-up visit in 2 weeks for evaluation and more detailed discussion of her treatment options after the staging work-up." . Discussed plans with patient for ongoing care management follow up and provided patient with direct contact information for care management team . Reviewed scheduled/upcoming provider appointments including: PET scan, f/u with Dr. Valeta Harms and PCP provider [name], FNP  Patient Self Care Activities:  . Self administers medications as prescribed . Attends all scheduled provider appointments . Calls provider office for new concerns or questions  Please see past updates related to this goal by clicking on the "Past Updates" button in the selected goal       Other   . "We haven't heard from the stomach doctor"       Daughter stated Current Barriers:  Marland Kitchen Knowledge Deficits related to treatment management of Gastrointestinal hemorrhage and suspicious Colon polyp  Nurse Case Manager Clinical Goal(s):  Marland Kitchen Over the  next 30 days, patient will have a new patient appointment with Gastroenterology for evaluation and treatment of recent GI hemorrhage and Colon polyp COMPLETED . New 12/19/18 - Over the next 30 days, patient will have a good understanding of the plan for ongoing treatment management of her GI symptoms and will adhere to those recommendations  CCM RN CM Interventions:  12/27/18 call completed with patients daughter [name]   . Discussed patient's recent GI f/u to evaluate the prescribed plan of care; daughter [name] advised the patient had to provide new stool samples, she is unsure why due to not entering the OV with her mother . Reviewed and discussed it appears the previous stool samples were inconclusive therefore requiring new samples in order to determine if disease is present contributing to patient's symptoms . Discussed patient's symptoms have improved but have not resolved  . Reviewed the patient's next steps are to try Ibgard, in which samples were provided for bloating and can be purchased over-the-counter; the GI office will call you with results . Evaluation of current treatment  plan related to GI symptoms and patient's adherence to plan as established by provider. . Discussed plans with patient for ongoing care management follow up and provided patient with direct contact information for care management team  Patient Self Care Activities:  . Unable to independently to manage Chronic conditions  Please see past updates related to this goal by clicking on the "Past Updates" button in the selected goal      . To assist patient with obtaining an Oxygen Concentrator       Current Barriers:  Marland Kitchen Knowledge Deficits related to how to obtain an Oxygen concentrator for continuous Oxygen use . Non adherence to continuous daytime Oxygen therapy  Nurse Case Manager Clinical Goal(s):  Marland Kitchen Over the next 30 days, patient will work with CCM RN and PCP provider to address needs related to obtaining an  order and Care Coordination for an Oxygen Concentrator for continuous Oxygen therapy . Over the next 90 days, patient will report 100% of adherence to wearing her Oxygen exactly as prescribed  CCM RN CM Interventions:  12/27/18 call completed with daughter [name]   . Evaluation of current treatment plan related to continuous Oxygen therapy and patient's adherence to plan as established by provider. Nash Dimmer with [facility], spoke with [facility] regarding Rx status for patient's Oxygen concentrator and rollator walker; Rep advised an Rx was not received for these items; She advised to refax to [place] fax # [phone]  . Discussed plans with patient for ongoing care management follow up and provided patient with direct contact information for care management team . Sent in basket message to provider [name], FNP with request to resend the Rx for the Oxygen concentrator and rollator walker, providing patient's weight/height, provided the fax # for [facility]   Patient Self Care Activities:  . Self administers medications as prescribed . Attends all scheduled provider appointments . Calls pharmacy for medication refills . Performs ADL's independently . Performs IADL's independently . Calls provider office for new concerns or questions  Please see past updates related to this goal by clicking on the "Past Updates" button in the selected goal         Telephone follow up appointment with care management team member scheduled for: 01/01/19   Barb Merino, RN, BSN, CCM Care Management Coordinator [facility]  Direct Phone: [phone]

## 2018-12-30 NOTE — Patient Instructions (Signed)
Social Worker Visit Information  Goals we discussed today:  Goals Addressed            This Visit's Progress   . "I would like my mom to have a CNA in her home"   On track    Daughter stated:  Current Barriers:  . Limited knowledge of health plan benefits  Social Work Clinical Goal(s):  Marland Kitchen Over the next 20 days, the patient will work with SW to determined health plan benefits. . Over the next 45 days the patient and her daughter will become more knowledgeable of community resources available to assist with care needs  CCM SW Interventions: Completed 12/30/2018 . Outbound call to the patients daughter to assess progression of patient goal. [name] reports she has not yet heard from [facility] regarding assessment . Outbound call to [facility], spoke with [facility] who reports patient assessment completed on 11/12. Patient PCS hours accepted by [facility] on 12/30/2018 . Contacted [name] to confirm patient caregiver hours accepted by [facility] . Thompson Caul to contact SW if she has not heard from [place] by Thursday 11/19  Patient Self Care Activities:  . Self administers medications as prescribed . Attends all scheduled provider appointments . Calls provider office for new concerns or questions  Please see past updates related to this goal by clicking on the "Past Updates" button in the selected goal         Follow Up Plan: SW will follow up with the patient over the next 3-4 weeks   Daneen Schick, BSW, CDP Social Worker, Certified Dementia Practitioner [place] [facility] [facility] [phone]

## 2018-12-30 NOTE — Telephone Encounter (Signed)
Palliative Care SW spoke with patient's daughter, [name], and scheduled a home visit for Thursday, 11/19, at 12:30.

## 2018-12-30 NOTE — Chronic Care Management (AMB) (Signed)
Chronic Care Management   Social Work Follow Up Note  12/30/2018 Name: Jessica Clarke MRN: [id number] DOB: 07-17-1950  Jessica Clarke is a 68 y.o. year old female who is a primary care patient of Jessica Clarke, Jessica Clarke. The CCM team was consulted for assistance with care coordination.   Review of patient status, including review of consultants reports, other relevant assessments, and collaboration with appropriate care team members and the patient's provider was performed as part of comprehensive patient evaluation and provision of chronic care management services.    SW placed an outbound call to the patients daughter and caregiver, Jessica Clarke, to assist with patient goal to obtain a caregiver in the home.   Outpatient Encounter Medications as of 12/30/2018  Medication Sig Note  . acetaminophen (TYLENOL) 325 MG tablet Take 325-650 mg by mouth every 8 (eight) hours as needed for mild pain.    Marland Kitchen allopurinol (ZYLOPRIM) 100 MG tablet Take 100 mg by mouth daily.     . Amino Acids-Protein Hydrolys (FEEDING SUPPLEMENT, PRO-STAT SUGAR FREE 64,) LIQD Take 30 mLs by mouth 2 (two) times daily. (Patient not taking: Reported on 12/19/2018)   . aspirin EC 325 MG tablet Take 1 tablet (325 mg total) by mouth daily. (Patient not taking: Reported on 12/27/2018) 12/19/2018: On hold until after procedure  . atenolol (TENORMIN) 100 MG tablet Take 100 mg by mouth daily.    Marland Kitchen atorvastatin (LIPITOR) 10 MG tablet Take 10 mg by mouth at bedtime.    . B Complex-Folic Acid (B COMPLEX FORMULA 1) TABS Take 1 tablet by mouth daily.    . calcitRIOL (ROCALTROL) 0.5 MCG capsule Take 2 capsules (1 mcg total) by mouth every Monday, Wednesday, and Friday with hemodialysis. (Patient not taking: Reported on 12/19/2018)   . cinacalcet (SENSIPAR) 30 MG tablet Take 1 tablet (30 mg total) by mouth daily with supper.   . colchicine 0.6 MG tablet Take 0.6 mg by mouth daily as needed (as directed for gout flares or pain).    . Darbepoetin Alfa  (ARANESP) 60 MCG/0.3ML SOSY injection Inject 0.3 mLs (60 mcg total) into the vein every Friday with hemodialysis. (Patient not taking: Reported on 12/19/2018)   . hydrocerin (EUCERIN) CREA Apply 1 application topically 2 (two) times daily.   Marland Kitchen loperamide (IMODIUM) 2 MG capsule Take 2 mg by mouth daily as needed for diarrhea or loose stools.    . midodrine (PROAMATINE) 10 MG tablet Take 1 tablet (10 mg total) by mouth 2 (two) times daily with a meal.   . multivitamin (RENA-VIT) TABS tablet Take 1 tablet by mouth at bedtime.   . nicotine (NICODERM CQ - DOSED IN MG/24 HOURS) 14 mg/24hr patch Place 1 patch (14 mg total) onto the skin daily as needed (smoking craving).   . thiamine (VITAMIN B-1) 100 MG tablet Take 100 mg by mouth daily.   . VELPHORO 500 MG chewable tablet Chew 1,000 mg by mouth 3 (three) times daily with meals.    . Vitamin D, Cholecalciferol, 25 MCG (1000 UT) TABS Take 1,000 Units by mouth daily.     No facility-administered encounter medications on file as of 12/30/2018.      Goals Addressed            This Visit's Progress   . "I would like my mom to have a CNA in her home"   On track    Daughter stated:  Current Barriers:  . Limited knowledge of health plan benefits  Social Work Clinical Goal(s):  .  Over the next 20 days, the patient will work with SW to determined health plan benefits. . Over the next 45 days the patient and her daughter will become more knowledgeable of community resources available to assist with care needs  CCM SW Interventions: Completed 12/30/2018 . Outbound call to the patients daughter to assess progression of patient goal. Jessica Clarke reports she has not yet heard from [facility] regarding assessment . Outbound call to [facility], spoke with [facility] who reports patient assessment completed on 11/12. Patient PCS hours accepted by [facility] on 12/30/2018 . Contacted Jessica Clarke to confirm patient caregiver hours accepted by  [facility] . Thompson Caul to contact SW if she has not heard from [place] by Thursday 11/19  Patient Self Care Activities:  . Self administers medications as prescribed . Attends all scheduled provider appointments . Calls provider office for new concerns or questions  Please see past updates related to this goal by clicking on the "Past Updates" button in the selected goal          Follow Up Plan: SW will follow up with the patient over the next three weeks to confirm no other caregiver resource needs prior to completing goal.   Jessica Clarke, BSW, CDP Social Worker, Certified Dementia Practitioner [place] [facility] [facility] [phone]  Total time spent performing care coordination and/or care management activities with the patient by phone or face to face = 10 minutes.

## 2018-12-30 NOTE — Patient Instructions (Signed)
Visit Information  Goals Addressed      Patient Stated   . "I need a biopsy to determine what medication will help" (pt-stated)       Current Barriers:  . Limited testing to identify lesion in L lung  Clinical Social Work Clinical Goal(s):  Marland Kitchen Over the next 30 days the patient will attend lung biopsy as scheduled on 12/04/18 to assist with tissue identification . Over the next 60 days the patient will become more knowledgeable of diagnosis as evidenced by ability to verbalize treatment plan  CCM RN CM Interventions: 12/27/18 call completed with patients daughter [name]  . Evaluation of current treatment plan related to Bronchoscopy for Lung Bx and patient's adherence to plan as established by provider . 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 Reviewed and discussed the following Oncology impression and plan as noted; o "It is not clear if the patient has any evidence of metastatic disease at this point because her last PET scan was performed in January 2020 before her diagnosis. o I recommended for the patient to have repeat PET scan next week for further evaluation of her condition. o If the PET scan is negative for metastatic disease outside the chest, will consider the patient for ultrasound-guided right thoracentesis to rule out the presence of malignant pleural effusion. o If the pleural fluid is negative for malignancy and no evidence of metastatic disease outside the chest, the patient will be treated for stage IIIc non-small cell lung cancer with concurrent chemoradiation with weekly carboplatin and paclitaxel. o I will arrange for the patient to come back for follow-up visit in 2 weeks for evaluation and more detailed discussion of her treatment options after the staging work-up." . Discussed plans with patient for ongoing care management follow up and provided patient with direct  contact information for care management team . Reviewed scheduled/upcoming provider appointments including: PET scan, f/u with Dr. Valeta Harms and PCP provider [name], FNP  Patient Self Care Activities:  . Self administers medications as prescribed . Attends all scheduled provider appointments . Calls provider office for new concerns or questions   Please see past updates related to this goal by clicking on the "Past Updates" button in the selected goal        Other   . "We haven't heard from the stomach doctor"       Daughter stated Current Barriers:  Marland Kitchen Knowledge Deficits related to treatment management of Gastrointestinal hemorrhage and suspicious Colon polyp  Nurse Case Manager Clinical Goal(s):  Marland Kitchen Over the next 30 days, patient will have a new patient appointment with Gastroenterology for evaluation and treatment of recent GI hemorrhage and Colon polyp COMPLETED . New 12/19/18 - Over the next 30 days, patient will have a good understanding of the plan for ongoing treatment management of her GI symptoms and will adhere to those recommendations  CCM RN CM Interventions:  12/27/18 call completed with patients daughter [name]   . Discussed patient's recent GI f/u to evaluate the prescribed plan of care; daughter [name] advised the patient had to provide new stool samples, she is unsure why due to not entering the OV with her mother . Reviewed and discussed it appears the previous stool samples were inconclusive therefore requiring new samples in order to determine if disease is present contributing to patient's symptoms . Discussed patient's symptoms have improved but have not resolved  . Reviewed  the patient's next steps are to try Ibgard, in which samples were provided for bloating and can be purchased over-the-counter; the GI office will call you with results . Evaluation of current treatment plan related to GI symptoms and patient's adherence to plan as established by  provider. . Discussed plans with patient for ongoing care management follow up and provided patient with direct contact information for care management team  Patient Self Care Activities:  . Unable to independently to manage Chronic conditions  Please see past updates related to this goal by clicking on the "Past Updates" button in the selected goal      . To assist patient with obtaining an Oxygen Concentrator       Current Barriers:  Marland Kitchen Knowledge Deficits related to how to obtain an Oxygen concentrator for continuous Oxygen use . Non adherence to continuous daytime Oxygen therapy  Nurse Case Manager Clinical Goal(s):  Marland Kitchen Over the next 30 days, patient will work with CCM RN and PCP provider to address needs related to obtaining an order and Care Coordination for an Oxygen Concentrator for continuous Oxygen therapy . Over the next 90 days, patient will report 100% of adherence to wearing her Oxygen exactly as prescribed  CCM RN CM Interventions:  12/27/18 call completed with daughter [name]   . Evaluation of current treatment plan related to continuous Oxygen therapy and patient's adherence to plan as established by provider. Nash Dimmer with [facility], spoke with [facility] regarding Rx status for patient's Oxygen concentrator and rollator walker; Rep advised an Rx was not received for these items; She advised to refax to [place] fax # [phone]  . Discussed plans with patient for ongoing care management follow up and provided patient with direct contact information for care management team . Sent in basket message to provider [name], FNP with request to resend the Rx for the Oxygen concentrator and rollator walker, providing patient's weight/height, provided the fax # for [facility]   Patient Self Care Activities:  . Self administers medications as prescribed . Attends all scheduled provider appointments . Calls pharmacy for medication refills . Performs ADL's  independently . Performs IADL's independently . Calls provider office for new concerns or questions  Please see past updates related to this goal by clicking on the "Past Updates" button in the selected goal        The patient verbalized understanding of instructions provided today and declined a print copy of patient instruction materials.   Telephone follow up appointment with care management team member scheduled for: 01/01/19  Barb Merino, RN, BSN, CCM Care Management Coordinator [place] [facility]  Direct Phone: [phone]

## 2018-12-31 ENCOUNTER — Encounter: Payer: Self-pay | Admitting: Nurse Practitioner

## 2018-12-31 ENCOUNTER — Telehealth: Payer: Self-pay

## 2018-12-31 DIAGNOSIS — A040 Enteropathogenic Escherichia coli infection: Secondary | ICD-10-CM | POA: Diagnosis not present

## 2018-12-31 DIAGNOSIS — D500 Iron deficiency anemia secondary to blood loss (chronic): Secondary | ICD-10-CM | POA: Diagnosis not present

## 2018-12-31 DIAGNOSIS — I132 Hypertensive heart and chronic kidney disease with heart failure and with stage 5 chronic kidney disease, or end stage renal disease: Secondary | ICD-10-CM | POA: Diagnosis not present

## 2018-12-31 DIAGNOSIS — I5030 Unspecified diastolic (congestive) heart failure: Secondary | ICD-10-CM | POA: Diagnosis not present

## 2018-12-31 DIAGNOSIS — N186 End stage renal disease: Secondary | ICD-10-CM | POA: Diagnosis not present

## 2018-12-31 DIAGNOSIS — K921 Melena: Secondary | ICD-10-CM | POA: Diagnosis not present

## 2018-12-31 NOTE — Progress Notes (Signed)
Reviewed and agree with management plans. ? ?Jessica Clarke L. Indira Sorenson, MD, MPH  ?

## 2018-12-31 NOTE — Telephone Encounter (Signed)
Dyana from [facility] called stating they received our referral for pt but havent been able to reach the patient she wanted to know if we have an emergency contact on file and their number.  Provided her with the patient's daughter [name]'s number. YRL,RMA

## 2019-01-01 ENCOUNTER — Other Ambulatory Visit: Payer: Self-pay

## 2019-01-01 ENCOUNTER — Telehealth: Payer: Medicare Other

## 2019-01-01 ENCOUNTER — Ambulatory Visit: Payer: Self-pay

## 2019-01-01 DIAGNOSIS — N186 End stage renal disease: Secondary | ICD-10-CM

## 2019-01-01 DIAGNOSIS — D631 Anemia in chronic kidney disease: Secondary | ICD-10-CM | POA: Diagnosis not present

## 2019-01-01 DIAGNOSIS — Z992 Dependence on renal dialysis: Secondary | ICD-10-CM

## 2019-01-01 DIAGNOSIS — N2581 Secondary hyperparathyroidism of renal origin: Secondary | ICD-10-CM | POA: Diagnosis not present

## 2019-01-01 DIAGNOSIS — I10 Essential (primary) hypertension: Secondary | ICD-10-CM

## 2019-01-01 DIAGNOSIS — R918 Other nonspecific abnormal finding of lung field: Secondary | ICD-10-CM

## 2019-01-02 ENCOUNTER — Other Ambulatory Visit: Payer: Medicare Other | Admitting: Licensed Clinical Social Worker

## 2019-01-02 ENCOUNTER — Other Ambulatory Visit: Payer: Self-pay

## 2019-01-02 ENCOUNTER — Other Ambulatory Visit: Payer: Medicare Other | Admitting: *Deleted

## 2019-01-02 DIAGNOSIS — Z515 Encounter for palliative care: Secondary | ICD-10-CM

## 2019-01-02 LAB — GASTROINTESTINAL PATHOGEN PANEL PCR
C. difficile Tox A/B, PCR: NOT DETECTED
Campylobacter, PCR: NOT DETECTED
Cryptosporidium, PCR: NOT DETECTED
E coli (ETEC) LT/ST PCR: NOT DETECTED
E coli (STEC) stx1/stx2, PCR: NOT DETECTED
E coli 0157, PCR: NOT DETECTED
Giardia lamblia, PCR: NOT DETECTED
Norovirus, PCR: NOT DETECTED
Rotavirus A, PCR: NOT DETECTED
Salmonella, PCR: NOT DETECTED
Shigella, PCR: NOT DETECTED

## 2019-01-02 NOTE — Chronic Care Management (AMB) (Signed)
Chronic Care Management   Follow Up Note   01/01/2019 Name: Shonica Weier MRN: [id number] DOB: August 03, 1950  Referred by: [name], FNP Reason for referral : Chronic Care Management (CCM RNCM Telephone Outreach )   Weston Fulco is a 68 y.o. year old female who is a primary care patient of [name], [place]. The CCM team was consulted for assistance with chronic disease management and care coordination needs.    Review of patient status, including review of consultants reports, relevant laboratory and other test results, and collaboration with appropriate care team members and the patient's provider was performed as part of comprehensive patient evaluation and provision of chronic care management services.    Placed outbound call to [facility] to check the status of DME requested; Oxygen concentrator and rollator.   Outpatient Encounter Medications as of 01/01/2019  Medication Sig Note  . acetaminophen (TYLENOL) 325 MG tablet Take 325-650 mg by mouth every 8 (eight) hours as needed for mild pain.    Marland Kitchen allopurinol (ZYLOPRIM) 100 MG tablet Take 100 mg by mouth daily.     . Amino Acids-Protein Hydrolys (FEEDING SUPPLEMENT, PRO-STAT SUGAR FREE 64,) LIQD Take 30 mLs by mouth 2 (two) times daily. (Patient not taking: Reported on 12/19/2018)   . aspirin EC 325 MG tablet Take 1 tablet (325 mg total) by mouth daily. (Patient not taking: Reported on 12/27/2018) 12/19/2018: On hold until after procedure  . atenolol (TENORMIN) 100 MG tablet Take 100 mg by mouth daily.    Marland Kitchen atorvastatin (LIPITOR) 10 MG tablet Take 10 mg by mouth at bedtime.    . B Complex-Folic Acid (B COMPLEX FORMULA 1) TABS Take 1 tablet by mouth daily.    . calcitRIOL (ROCALTROL) 0.5 MCG capsule Take 2 capsules (1 mcg total) by mouth every Monday, Wednesday, and Friday with hemodialysis. (Patient not taking: Reported on 12/19/2018)   . cinacalcet (SENSIPAR) 30 MG tablet Take 1 tablet (30 mg total) by mouth daily with supper.   .  colchicine 0.6 MG tablet Take 0.6 mg by mouth daily as needed (as directed for gout flares or pain).    . Darbepoetin Alfa (ARANESP) 60 MCG/0.3ML SOSY injection Inject 0.3 mLs (60 mcg total) into the vein every Friday with hemodialysis. (Patient not taking: Reported on 12/19/2018)   . hydrocerin (EUCERIN) CREA Apply 1 application topically 2 (two) times daily.   Marland Kitchen loperamide (IMODIUM) 2 MG capsule Take 2 mg by mouth daily as needed for diarrhea or loose stools.    . midodrine (PROAMATINE) 10 MG tablet Take 1 tablet (10 mg total) by mouth 2 (two) times daily with a meal.   . multivitamin (RENA-VIT) TABS tablet Take 1 tablet by mouth at bedtime.   . nicotine (NICODERM CQ - DOSED IN MG/24 HOURS) 14 mg/24hr patch Place 1 patch (14 mg total) onto the skin daily as needed (smoking craving).   . thiamine (VITAMIN B-1) 100 MG tablet Take 100 mg by mouth daily.   . VELPHORO 500 MG chewable tablet Chew 1,000 mg by mouth 3 (three) times daily with meals.    . Vitamin D, Cholecalciferol, 25 MCG (1000 UT) TABS Take 1,000 Units by mouth daily.     No facility-administered encounter medications on file as of 01/01/2019.      Goals Addressed    . To assist patient with obtaining an Oxygen Concentrator       Current Barriers:  Marland Kitchen Knowledge Deficits related to how to obtain an Oxygen concentrator for continuous Oxygen  use . Non adherence to continuous daytime Oxygen therapy  Nurse Case Manager Clinical Goal(s):  Marland Kitchen Over the next 30 days, patient will work with CCM RN and PCP provider to address needs related to obtaining an order and Care Coordination for an Oxygen Concentrator for continuous Oxygen therapy . Over the next 90 days, patient will report 100% of adherence to wearing her Oxygen exactly as prescribed  CCM RN CM Interventions:  12/27/18 call completed with daughter [name]   . Evaluation of current treatment plan related to continuous Oxygen therapy and patient's adherence to plan as established by  provider. Nash Dimmer with [facility], spoke with [facility] regarding Rx status for patient's Oxygen concentrator and rollator walker; Rep advised an Rx was not received for these items; She advised to refax to [facility] fax # [phone]  . Discussed plans with patient for ongoing care management follow up and provided patient with direct contact information for care management team . Sent in basket message to provider [name], FNP with request to resend the Rx for the Oxygen concentrator and rollator walker, providing patient's weight/height, provided the fax # for [facility]   01/01/19 Placed outbound call to [facility]  . Determined orders have not been received for the Oxygen Concentrator for this patient . Discussed the orders should be refaxed to [facility] [phone] and [phone] . Sent in basket message to provider [name], FNP with request to resend the orders for the Oxygen concentrator, provided the fax information needed  Patient Self Care Activities:  . Self administers medications as prescribed . Attends all scheduled provider appointments . Calls pharmacy for medication refills . Performs ADL's independently . Performs IADL's independently . Calls provider office for new concerns or questions  Please see past updates related to this goal by clicking on the "Past Updates" button in the selected goal          Telephone follow up appointment with care management team member scheduled for: 01/03/19  Barb Merino, RN, BSN, CCM Care Management Coordinator [facility]  Direct Phone: [phone]

## 2019-01-02 NOTE — Progress Notes (Signed)
COMMUNITY PALLIATIVE CARE RN NOTE  PATIENT NAME: Jessica Clarke DOB: 1950/03/18 MRN: [id number]  PRIMARY CARE PROVIDER: [name], FNP  RESPONSIBLE PARTY:  Acct ID - Guarantor Home Phone Work Phone Relationship Acct Type  [id number] Jessica [phone]  Self P/F     [address], [place], [place] [address]   Covid-19 Pre-screening Negative  PLAN OF CARE and INTERVENTION:  1. ADVANCE CARE PLANNING/GOALS OF CARE: Goal is for patient to remain in the home with her daughter until she is strong enough to live on her own again.  2. PATIENT/CAREGIVER EDUCATION: Safe Mobility 3. DISEASE STATUS: Joint visit made with Palliative care SW, Jessica Clarke. Met with patient and her daughter outside. Patient reports that after visit she is going to get a pedicure because of her long toe nails. She feels that this is affecting her mobility and causes some pain. She is alert and oriented and able to engage in appropriate conversation. No dyspnea noted during with conversation. She had an appointment with a Pulmonologist on 12/12/18 to discuss a bronchoscopy tissue sampling and staging of her lung cancer. She has a right upper lobe lung mass. They are working on scheduling this soon. She had a session with her PT this am for strengthening. She ambulates without use of assistive devices. Mainly holds onto furniture, walls, etc. She is awaiting DME, rollator and oxygen concentrator. She continues with fatigue. Her appetite is variable. She is losing weight. She continues to drink Glucerna for nutritional supplementation. She continues with dialysis Mon, Wed, Fri. 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 She remains able to give herself a sponge bath and requires minimal assistance with dressing. Will continue to monitor.   HISTORY OF PRESENT ILLNESS:  This is a 68 yo female who resides with her daughter. Palliative care team continue to follow patient. Will continue to visit  monthly and PRN.  CODE STATUS: Full Code  ADVANCED DIRECTIVES: N MOST FORM: no PPS: 50%   (Duration of visit and documentation 45 minutes)   Jessica Eastern, RN BSN

## 2019-01-03 ENCOUNTER — Ambulatory Visit: Payer: Self-pay

## 2019-01-03 ENCOUNTER — Other Ambulatory Visit: Payer: Self-pay

## 2019-01-03 ENCOUNTER — Telehealth: Payer: Medicare Other

## 2019-01-03 DIAGNOSIS — N186 End stage renal disease: Secondary | ICD-10-CM | POA: Diagnosis not present

## 2019-01-03 DIAGNOSIS — I10 Essential (primary) hypertension: Secondary | ICD-10-CM

## 2019-01-03 DIAGNOSIS — D631 Anemia in chronic kidney disease: Secondary | ICD-10-CM | POA: Diagnosis not present

## 2019-01-03 DIAGNOSIS — R918 Other nonspecific abnormal finding of lung field: Secondary | ICD-10-CM

## 2019-01-03 DIAGNOSIS — N2581 Secondary hyperparathyroidism of renal origin: Secondary | ICD-10-CM | POA: Diagnosis not present

## 2019-01-03 DIAGNOSIS — Z992 Dependence on renal dialysis: Secondary | ICD-10-CM | POA: Diagnosis not present

## 2019-01-03 NOTE — Progress Notes (Signed)
COMMUNITY PALLIATIVE CARE SW NOTE  PATIENT NAME: Jessica Clarke DOB: [birth year] MRN: [id number]  PRIMARY CARE PROVIDER: [name], FNP  RESPONSIBLE PARTY:  Acct ID - Guarantor Home Phone Work Phone Relationship Acct Type  [id number] Jessica Mcardle775 [phone]  Self P/F     [address], [place],  [address]     PLAN OF CARE and INTERVENTIONS:             1. GOALS OF CARE/ ADVANCE CARE PLANNING:  Patient does not wish to be hospitalized.  She wants to get stronger and be pain free.  Patient is a full code. 2. SOCIAL/EMOTIONAL/SPIRITUAL ASSESSMENT/ INTERVENTIONS:  SW and Palliative Care RN, Jessica Clarke, met with patient and her daughter, Jessica Clarke.  Patient was getting ready to go to have a pedicure due to her long toenails.  She said it hurt to walk.  SW reviewed the Living Will and HCPOA document daughter had requested.  Instructed her on completing the forms and she stated she understood.  SW provided active listening and supportive counseling while Jessica Clarke discussed her parents, as well as her own, poor health. 3. PATIENT/CAREGIVER EDUCATION/ COPING:  Patient and her daughter cope by problem-solving. 4. PERSONAL EMERGENCY PLAN:  Jessica Clarke will contact EMS. 5. COMMUNITY RESOURCES COORDINATION/ HEALTH CARE NAVIGATION:  Patient is still receiving PT. 6. FINANCIAL/LEGAL CONCERNS/INTERVENTIONS:  Patient is on a fixed income.     SOCIAL HX:  Social History   Tobacco Use  . Smoking status: Current Every Day Smoker    Packs/day: 0.25    Years: 30.00    Pack years: 7.50    Types: Cigarettes  . Smokeless tobacco: Never Used  . Tobacco comment: smoking cessation  Substance Use Topics  . Alcohol use: Not Currently    Alcohol/week: 6.0 standard drinks    Types: 6 Cans of beer per week    CODE STATUS:  Full Code ADVANCED DIRECTIVES: No MOST FORM COMPLETE:  No HOSPICE EDUCATION PROVIDED:  N PPS:  Appetite varies.  She ambulates independently. Duration of visit and  documentation:  45 minutes.      Jessica Corn Leotha Voeltz, LCSW

## 2019-01-03 NOTE — Chronic Care Management (AMB) (Signed)
Chronic Care Management   Follow Up Note   01/03/2019 Name: Kimball Manske MRN: [id number] DOB: 1950/06/19  Referred by: [name], FNP Reason for referral : Chronic Care Management (CCM RNCM Telephone Outreach )   Jessica Clarke is a 68 y.o. year old female who is a primary care patient of [name], [place]. The CCM team was consulted for assistance with chronic disease management and care coordination needs.    Review of patient status, including review of consultants reports, relevant laboratory and other test results, and collaboration with appropriate care team members and the patient's provider was performed as part of comprehensive patient evaluation and provision of chronic care management services.    Placed outbound call to [facility] to f/u on DME status for Oxygen Concentrator and [place].   Outpatient Encounter Medications as of 01/03/2019  Medication Sig Note  . acetaminophen (TYLENOL) 325 MG tablet Take 325-650 mg by mouth every 8 (eight) hours as needed for mild pain.    Marland Kitchen allopurinol (ZYLOPRIM) 100 MG tablet Take 100 mg by mouth daily.     . Amino Acids-Protein Hydrolys (FEEDING SUPPLEMENT, PRO-STAT SUGAR FREE 64,) LIQD Take 30 mLs by mouth 2 (two) times daily. (Patient not taking: Reported on 12/19/2018)   . aspirin EC 325 MG tablet Take 1 tablet (325 mg total) by mouth daily. (Patient not taking: Reported on 12/27/2018) 12/19/2018: On hold until after procedure  . atenolol (TENORMIN) 100 MG tablet Take 100 mg by mouth daily.    Marland Kitchen atorvastatin (LIPITOR) 10 MG tablet Take 10 mg by mouth at bedtime.    . B Complex-Folic Acid (B COMPLEX FORMULA 1) TABS Take 1 tablet by mouth daily.    . calcitRIOL (ROCALTROL) 0.5 MCG capsule Take 2 capsules (1 mcg total) by mouth every Monday, Wednesday, and Friday with hemodialysis. (Patient not taking: Reported on 12/19/2018)   . cinacalcet (SENSIPAR) 30 MG tablet Take 1 tablet (30 mg total) by mouth daily with supper.   .  colchicine 0.6 MG tablet Take 0.6 mg by mouth daily as needed (as directed for gout flares or pain).    . Darbepoetin Alfa (ARANESP) 60 MCG/0.3ML SOSY injection Inject 0.3 mLs (60 mcg total) into the vein every Friday with hemodialysis. (Patient not taking: Reported on 12/19/2018)   . hydrocerin (EUCERIN) CREA Apply 1 application topically 2 (two) times daily.   Marland Kitchen loperamide (IMODIUM) 2 MG capsule Take 2 mg by mouth daily as needed for diarrhea or loose stools.    . midodrine (PROAMATINE) 10 MG tablet Take 1 tablet (10 mg total) by mouth 2 (two) times daily with a meal.   . multivitamin (RENA-VIT) TABS tablet Take 1 tablet by mouth at bedtime.   . nicotine (NICODERM CQ - DOSED IN MG/24 HOURS) 14 mg/24hr patch Place 1 patch (14 mg total) onto the skin daily as needed (smoking craving).   . thiamine (VITAMIN B-1) 100 MG tablet Take 100 mg by mouth daily.   . VELPHORO 500 MG chewable tablet Chew 1,000 mg by mouth 3 (three) times daily with meals.    . Vitamin D, Cholecalciferol, 25 MCG (1000 UT) TABS Take 1,000 Units by mouth daily.     No facility-administered encounter medications on file as of 01/03/2019.      Goals Addressed    . To assist patient with obtaining an Oxygen Concentrator       Current Barriers:  Marland Kitchen Knowledge Deficits related to how to obtain an Oxygen concentrator for continuous Oxygen  use . Non adherence to continuous daytime Oxygen therapy  Nurse Case Manager Clinical Goal(s):  Marland Kitchen Over the next 30 days, patient will work with CCM RN and PCP provider to address needs related to obtaining an order and Care Coordination for an Oxygen Concentrator for continuous Oxygen therapy . Over the next 90 days, patient will report 100% of adherence to wearing her Oxygen exactly as prescribed  CCM RN CM Interventions:  12/27/18 call completed with daughter [name]   . Evaluation of current treatment plan related to continuous Oxygen therapy and patient's adherence to plan as established by  provider. Nash Dimmer with [facility], spoke with [facility] regarding Rx status for patient's Oxygen concentrator and rollator walker; Rep advised an Rx was not received for these items; She advised to refax to [facility] fax # [phone]  . Discussed plans with patient for ongoing care management follow up and provided patient with direct contact information for care management team . Sent in basket message to provider [name], FNP with request to resend the Rx for the Oxygen concentrator and rollator walker, providing patient's weight/height, provided the fax # for [facility]   01/03/19 Placed outbound call to [facility] [phone]  Spoke to [facility] who advised this patient receives DME from [facility] in [place]  . Determined orders have not been received for the Oxygen Concentrator for this patient . Discussed the orders should be refaxed to [facility] [phone] . Sent in basket message to provider [name], FNP with request to resend the orders for the Oxygen concentrator, provided the fax information needed  Patient Self Care Activities:  . Self administers medications as prescribed . Attends all scheduled provider appointments . Calls pharmacy for medication refills . Performs ADL's independently . Performs IADL's independently . Calls provider office for new concerns or questions   Please see past updates related to this goal by clicking on the "Past Updates" button in the selected goal          Telephone follow up appointment with care management team member scheduled for: 01/08/19   Barb Merino, RN, BSN, CCM Care Management Coordinator [facility]  Direct Phone: [phone]

## 2019-01-06 ENCOUNTER — Encounter (HOSPITAL_COMMUNITY): Payer: Self-pay | Admitting: Internal Medicine

## 2019-01-07 ENCOUNTER — Telehealth: Payer: Self-pay

## 2019-01-07 ENCOUNTER — Ambulatory Visit (HOSPITAL_COMMUNITY): Payer: Medicare Other

## 2019-01-07 DIAGNOSIS — N2581 Secondary hyperparathyroidism of renal origin: Secondary | ICD-10-CM | POA: Diagnosis not present

## 2019-01-07 DIAGNOSIS — D631 Anemia in chronic kidney disease: Secondary | ICD-10-CM | POA: Diagnosis not present

## 2019-01-07 DIAGNOSIS — N186 End stage renal disease: Secondary | ICD-10-CM | POA: Diagnosis not present

## 2019-01-07 DIAGNOSIS — Z992 Dependence on renal dialysis: Secondary | ICD-10-CM | POA: Diagnosis not present

## 2019-01-07 NOTE — Telephone Encounter (Signed)
I have faxed over patient's prescription, demographics and [facility]. YRL,RMA

## 2019-01-07 NOTE — Telephone Encounter (Signed)
-----   Message from [name], [place] sent at 01/07/2019  1:21 PM EST ----- Regarding: FW: Re: need orders resent for Oxygen Concentrator and Rollator Walker Hi Taksh Hjort  Can you send this to [facility], we can use the old script ----- Message ----- From: Lynne Logan, RN Sent: 01/03/2019  10:41 AM EST To: [name], FNP Subject: Re: need orders resent for Oxygen Concentrat#  Hello Doreene Burke,   Checked with [facility]... [name] gets her DME from [facility] in [place] and this is initiated through [facility]. Please refax the orders for an Oxygen concentrator and a Rollator walker including patient's ht/wt to fax # [phone].   Thanks, [employer]

## 2019-01-08 DIAGNOSIS — C3491 Malignant neoplasm of unspecified part of right bronchus or lung: Secondary | ICD-10-CM | POA: Diagnosis not present

## 2019-01-10 DIAGNOSIS — D631 Anemia in chronic kidney disease: Secondary | ICD-10-CM | POA: Diagnosis not present

## 2019-01-10 DIAGNOSIS — N186 End stage renal disease: Secondary | ICD-10-CM | POA: Diagnosis not present

## 2019-01-10 DIAGNOSIS — Z992 Dependence on renal dialysis: Secondary | ICD-10-CM | POA: Diagnosis not present

## 2019-01-10 DIAGNOSIS — N2581 Secondary hyperparathyroidism of renal origin: Secondary | ICD-10-CM | POA: Diagnosis not present

## 2019-01-13 DIAGNOSIS — N2581 Secondary hyperparathyroidism of renal origin: Secondary | ICD-10-CM | POA: Diagnosis not present

## 2019-01-13 DIAGNOSIS — Z992 Dependence on renal dialysis: Secondary | ICD-10-CM | POA: Diagnosis not present

## 2019-01-13 DIAGNOSIS — D631 Anemia in chronic kidney disease: Secondary | ICD-10-CM | POA: Diagnosis not present

## 2019-01-13 DIAGNOSIS — N186 End stage renal disease: Secondary | ICD-10-CM | POA: Diagnosis not present

## 2019-01-14 ENCOUNTER — Ambulatory Visit (INDEPENDENT_AMBULATORY_CARE_PROVIDER_SITE_OTHER): Payer: Medicare Other

## 2019-01-14 ENCOUNTER — Other Ambulatory Visit: Payer: Self-pay

## 2019-01-14 VITALS — Ht 71.0 in | Wt 150.0 lb

## 2019-01-14 DIAGNOSIS — Z0000 Encounter for general adult medical examination without abnormal findings: Secondary | ICD-10-CM

## 2019-01-14 NOTE — Patient Instructions (Signed)
Jessica Clarke , Thank you for taking time to come for your Medicare Wellness Visit. I appreciate your ongoing commitment to your health goals. Please review the following plan we discussed and let me know if I can assist you in the future.   Screening recommendations/referrals: Colonoscopy: 10/2018 Mammogram: declines Bone Density: declines Recommended yearly ophthalmology/optometry visit for glaucoma screening and checkup Recommended yearly dental visit for hygiene and checkup  Vaccinations: Influenza vaccine: 10/2018 Pneumococcal vaccine: 11/2016 Tdap vaccine: 07/2017 Shingles vaccine: discussed    Advanced directives: Advance directive discussed with you today.   Conditions/risks identified: HTN  Next appointment: 01/20/2019 at 3:30   Preventive Care 11 Years and Older, Female Preventive care refers to lifestyle choices and visits with your health care provider that can promote health and wellness. What does preventive care include?  A yearly physical exam. This is also called an annual well check.  Dental exams once or twice a year.  Routine eye exams. Ask your health care provider how often you should have your eyes checked.  Personal lifestyle choices, including:  Daily care of your teeth and gums.  Regular physical activity.  Eating a healthy diet.  Avoiding tobacco and drug use.  Limiting alcohol use.  Practicing safe sex.  Taking low-dose aspirin every day.  Taking vitamin and mineral supplements as recommended by your health care provider. What happens during an annual well check? The services and screenings done by your health care provider during your annual well check will depend on your age, overall health, lifestyle risk factors, and family history of disease. Counseling  Your health care provider may ask you questions about your:  Alcohol use.  Tobacco use.  Drug use.  Emotional well-being.  Home and relationship well-being.  Sexual activity.   Eating habits.  History of falls.  Memory and ability to understand (cognition).  Work and work Statistician.  Reproductive health. Screening  You may have the following tests or measurements:  Height, weight, and BMI.  Blood pressure.  Lipid and cholesterol levels. These may be checked every 5 years, or more frequently if you are over 68 years old.  Skin check.  Lung cancer screening. You may have this screening every year starting at age 17 if you have a 30-pack-year history of smoking and currently smoke or have quit within the past 15 years.  Fecal occult blood test (FOBT) of the stool. You may have this test every year starting at age 65.  Flexible sigmoidoscopy or colonoscopy. You may have a sigmoidoscopy every 5 years or a colonoscopy every 10 years starting at age 37.  Hepatitis C blood test.  Hepatitis B blood test.  Sexually transmitted disease (STD) testing.  Diabetes screening. This is done by checking your blood sugar (glucose) after you have not eaten for a while (fasting). You may have this done every 1-3 years.  Bone density scan. This is done to screen for osteoporosis. You may have this done starting at age 59.  Mammogram. This may be done every 1-2 years. Talk to your health care provider about how often you should have regular mammograms. Talk with your health care provider about your test results, treatment options, and if necessary, the need for more tests. Vaccines  Your health care provider may recommend certain vaccines, such as:  Influenza vaccine. This is recommended every year.  Tetanus, diphtheria, and acellular pertussis (Tdap, Td) vaccine. You may need a Td booster every 10 years.  Zoster vaccine. You may need this after age  60.  Pneumococcal 13-valent conjugate (PCV13) vaccine. One dose is recommended after age 64.  Pneumococcal polysaccharide (PPSV23) vaccine. One dose is recommended after age 43. Talk to your health care provider  about which screenings and vaccines you need and how often you need them. This information is not intended to replace advice given to you by your health care provider. Make sure you discuss any questions you have with your health care provider. Document Released: 02/26/2015 Document Revised: 10/20/2015 Document Reviewed: 12/01/2014 Elsevier Interactive Patient Education  2017 [place] Prevention in the Home Falls can cause injuries. They can happen to people of all ages. There are many things you can do to make your home safe and to help prevent falls. What can I do on the outside of my home?  Regularly fix the edges of walkways and driveways and fix any cracks.  Remove anything that might make you trip as you walk through a door, such as a raised step or threshold.  Trim any bushes or trees on the path to your home.  Use bright outdoor lighting.  Clear any walking paths of anything that might make someone trip, such as rocks or tools.  Regularly check to see if handrails are loose or broken. Make sure that both sides of any steps have handrails.  Any raised decks and porches should have guardrails on the edges.  Have any leaves, snow, or ice cleared regularly.  Use sand or salt on walking paths during winter.  Clean up any spills in your garage right away. This includes oil or grease spills. What can I do in the bathroom?  Use night lights.  Install grab bars by the toilet and in the tub and shower. Do not use towel bars as grab bars.  Use non-skid mats or decals in the tub or shower.  If you need to sit down in the shower, use a plastic, non-slip stool.  Keep the floor dry. Clean up any water that spills on the floor as soon as it happens.  Remove soap buildup in the tub or shower regularly.  Attach bath mats securely with double-sided non-slip rug tape.  Do not have throw rugs and other things on the floor that can make you trip. What can I do in the  bedroom?  Use night lights.  Make sure that you have a light by your bed that is easy to reach.  Do not use any sheets or blankets that are too big for your bed. They should not hang down onto the floor.  Have a firm chair that has side arms. You can use this for support while you get dressed.  Do not have throw rugs and other things on the floor that can make you trip. What can I do in the kitchen?  Clean up any spills right away.  Avoid walking on wet floors.  Keep items that you use a lot in easy-to-reach places.  If you need to reach something above you, use a strong step stool that has a grab bar.  Keep electrical cords out of the way.  Do not use floor polish or wax that makes floors slippery. If you must use wax, use non-skid floor wax.  Do not have throw rugs and other things on the floor that can make you trip. What can I do with my stairs?  Do not leave any items on the stairs.  Make sure that there are handrails on both sides of the stairs and  use them. Fix handrails that are broken or loose. Make sure that handrails are as long as the stairways.  Check any carpeting to make sure that it is firmly attached to the stairs. Fix any carpet that is loose or worn.  Avoid having throw rugs at the top or bottom of the stairs. If you do have throw rugs, attach them to the floor with carpet tape.  Make sure that you have a light switch at the top of the stairs and the bottom of the stairs. If you do not have them, ask someone to add them for you. What else can I do to help prevent falls?  Wear shoes that:  Do not have high heels.  Have rubber bottoms.  Are comfortable and fit you well.  Are closed at the toe. Do not wear sandals.  If you use a stepladder:  Make sure that it is fully opened. Do not climb a closed stepladder.  Make sure that both sides of the stepladder are locked into place.  Ask someone to hold it for you, if possible.  Clearly mark and make  sure that you can see:  Any grab bars or handrails.  First and last steps.  Where the edge of each step is.  Use tools that help you move around (mobility aids) if they are needed. These include:  Canes.  Walkers.  Scooters.  Crutches.  Turn on the lights when you go into a dark area. Replace any light bulbs as soon as they burn out.  Set up your furniture so you have a clear path. Avoid moving your furniture around.  If any of your floors are uneven, fix them.  If there are any pets around you, be aware of where they are.  Review your medicines with your doctor. Some medicines can make you feel dizzy. This can increase your chance of falling. Ask your doctor what other things that you can do to help prevent falls. This information is not intended to replace advice given to you by your health care provider. Make sure you discuss any questions you have with your health care provider. Document Released: 11/26/2008 Document Revised: 07/08/2015 Document Reviewed: 03/06/2014 Elsevier Interactive Patient Education  2017 [employer].

## 2019-01-14 NOTE — Progress Notes (Signed)
This visit type was conducted due to national recommendations for restrictions regarding the COVID-19 Pandemic (e.g. social distancing). This format is felt to be most appropriate for this patient at this time. All issues noted in this document were discussed and addressed. No physical exam was performed (except for noted visual exam findings with Video Visits). This patient, Ms. Jessica Clarke and her daughter Jessica Clarke, has given permission to perform this visit via telephone. Vital signs may be absent or patient reported.  Patient location:  At home  Nurse location:  [place] office    Subjective:   Jessica Clarke is a 68 y.o. female who presents for Medicare Annual (Subsequent) preventive examination.  Review of Systems:  n/a Cardiac Risk Factors include: advanced age (>70men, >73 women);hypertension;sedentary lifestyle;smoking/ tobacco exposure     Objective:     Vitals: Ht 5\' 11"  (1.803 m) Comment: per daughter   Wt 150 lb (68 kg) Comment: per daughter   BMI 20.92 kg/m   Body mass index is 20.92 kg/m.  Advanced Directives 01/14/2019 12/19/2018 10/25/2018 08/26/2016 08/26/2016 03/09/2016 11/18/2015  Does Patient Have a Medical Advance Directive? No No Unable to assess, patient is non-responsive or altered mental status No No No No  Would patient like information on creating a medical advance directive? - No - Patient declined - No - Patient declined - No - Patient declined No - patient declined information    Tobacco Social History   Tobacco Use  Smoking Status Current Every Day Smoker   Packs/day: 0.25   Years: 30.00   Pack years: 7.50   Types: Cigarettes  Smokeless Tobacco Never Used  Tobacco Comment   smoking cessation     Ready to quit: Yes Counseling given: Yes Comment: smoking cessation   Clinical Intake:  Pre-visit preparation completed: Yes  Pain : 0-10 Pain Score: 5  Pain Type: Chronic pain Pain Location: Abdomen Pain Orientation: Right Pain Radiating  Towards: moves to rib cage and back Pain Descriptors / Indicators: Nagging, Discomfort Pain Onset: More than a month ago Pain Frequency: Intermittent Pain Relieving Factors: nothing helps pain  Pain Relieving Factors: nothing helps pain  Nutritional Status: BMI of 19-24  Normal Nutritional Risks: Nausea/ vomitting/ diarrhea(spitting up brown phlegm) Diabetes: No  How often do you need to have someone help you when you read instructions, pamphlets, or other written materials from your doctor or pharmacy?: 1 - Never What is the last grade level you completed in school?: 12th grade  Interpreter Needed?: No  Information entered by :: NAllen LPN  Past Medical History:  Diagnosis Date   Abnormal Pap smear    ASCUS ?HGSIL   Anemia    Arthritis    Cancer ([place])    lung ( per daughter)   Cervical dysplasia    CHF (congestive heart failure) (HCC)    Chronic kidney disease    Constipation    Constipation    Dyspnea    GERD (gastroesophageal reflux disease)    Gout    Hyperlipidemia    Hypertension    Myocardial infarction ([place]) 2015   Obesity    Pneumonia    Uterine polyp    Past Surgical History:  Procedure Laterality Date   AV FISTULA PLACEMENT  01/18/2012   Procedure: ARTERIOVENOUS (AV) FISTULA CREATION;  Surgeon: Angelia Mould, MD;  Location: [facility];  Service: Vascular;  Laterality: Left;   BIOPSY  10/20/2018   Procedure: BIOPSY;  Surgeon: Thornton Park, MD;  Location: [facility];  Service: Gastroenterology;;  COLONOSCOPY WITH PROPOFOL N/A 10/20/2018   Procedure: COLONOSCOPY WITH PROPOFOL;  Surgeon: Thornton Park, MD;  Location: [facility];  Service: Gastroenterology;  Laterality: N/A;   DILATION AND CURETTAGE OF UTERUS     HYSTEROSCOPY     LEEP     LEFT HEART CATHETERIZATION WITH CORONARY ANGIOGRAM N/A 08/26/2013   Procedure: LEFT HEART CATHETERIZATION WITH CORONARY ANGIOGRAM;  Surgeon: Leonie Man, MD;  Location: [facility] CATH LAB;   Service: Cardiovascular;  Laterality: N/A;   POLYPECTOMY  10/20/2018   Procedure: POLYPECTOMY;  Surgeon: Thornton Park, MD;  Location: [facility] ENDOSCOPY;  Service: Gastroenterology;;   REVISON OF ARTERIOVENOUS FISTULA Left 0/11/9321   Procedure: PLICATION OF ARTERIOVENOUS FISTULA;  Surgeon: Conrad Panama, MD;  Location: [place];  Service: Vascular;  Laterality: Left;   VIDEO BRONCHOSCOPY WITH ENDOBRONCHIAL ULTRASOUND N/A 12/19/2018   Procedure: VIDEO BRONCHOSCOPY WITH ENDOBRONCHIAL ULTRASOUND;  Surgeon: Garner Nash, DO;  Location: [place];  Service: Thoracic;  Laterality: N/A;   Family History  Problem Relation Age of Onset   Esophageal cancer Other    Pancreatic cancer Other    Heart disease Mother    Hypertension Mother    Breast cancer Sister    Esophageal cancer Sister    Cancer Sister    Deep vein thrombosis Sister    Diabetes Sister    Hypertension Sister    Hypertension Daughter    Colon cancer Neg Hx    Social History   Socioeconomic History   Marital status: Married    Spouse name: Not on file   Number of children: 1   Years of education: Not on file   Highest education level: Not on file  Occupational History    Employer: UNEMPLOYED    Comment: Unemployed   Social Designer, fashion/clothing strain: Not hard at all   Food insecurity    Worry: Never true    Inability: Never true   Transportation needs    Medical: No    Non-medical: No  Tobacco Use   Smoking status: Current Every Day Smoker    Packs/day: 0.25    Years: 30.00    Pack years: 7.50    Types: Cigarettes   Smokeless tobacco: Never Used   Tobacco comment: smoking cessation  Substance and Sexual Activity   Alcohol use: Not Currently   Drug use: No   Sexual activity: Not Currently    Birth control/protection: Post-menopausal    Comment: Did not ask  Lifestyle   Physical activity    Days per week: 0 days    Minutes per session: 0 min   Stress: Not at all    Relationships   Social connections    Talks on phone: Not on file    Gets together: Not on file    Attends religious service: Not on file    Active member of club or organization: Not on file    Attends meetings of clubs or organizations: Not on file    Relationship status: Not on file  Other Topics Concern   Not on file  Social History Narrative   Not on file    Outpatient Encounter Medications as of 01/14/2019  Medication Sig   acetaminophen (TYLENOL) 325 MG tablet Take 325-650 mg by mouth every 8 (eight) hours as needed for mild pain.    allopurinol (ZYLOPRIM) 100 MG tablet Take 100 mg by mouth daily.     atenolol (TENORMIN) 100 MG tablet Take 100 mg by mouth daily.  atorvastatin (LIPITOR) 10 MG tablet Take 10 mg by mouth at bedtime.    B Complex-Folic Acid (B COMPLEX FORMULA 1) TABS Take 1 tablet by mouth daily.    cinacalcet (SENSIPAR) 30 MG tablet Take 1 tablet (30 mg total) by mouth daily with supper.   colchicine 0.6 MG tablet Take 0.6 mg by mouth daily as needed (as directed for gout flares or pain).    loperamide (IMODIUM) 2 MG capsule Take 2 mg by mouth daily as needed for diarrhea or loose stools.    midodrine (PROAMATINE) 10 MG tablet Take 1 tablet (10 mg total) by mouth 2 (two) times daily with a meal.   multivitamin (RENA-VIT) TABS tablet Take 1 tablet by mouth at bedtime.   thiamine (VITAMIN B-1) 100 MG tablet Take 100 mg by mouth daily.   VELPHORO 500 MG chewable tablet Chew 1,000 mg by mouth 3 (three) times daily with meals.    Vitamin D, Cholecalciferol, 25 MCG (1000 UT) TABS Take 1,000 Units by mouth daily.    Amino Acids-Protein Hydrolys (FEEDING SUPPLEMENT, PRO-STAT SUGAR FREE 64,) LIQD Take 30 mLs by mouth 2 (two) times daily. (Patient not taking: Reported on 12/19/2018)   aspirin EC 325 MG tablet Take 1 tablet (325 mg total) by mouth daily. (Patient not taking: Reported on 12/27/2018)   calcitRIOL (ROCALTROL) 0.5 MCG capsule Take 2 capsules  (1 mcg total) by mouth every Monday, Wednesday, and Friday with hemodialysis. (Patient not taking: Reported on 12/19/2018)   Darbepoetin Alfa (ARANESP) 60 MCG/0.3ML SOSY injection Inject 0.3 mLs (60 mcg total) into the vein every Friday with hemodialysis. (Patient not taking: Reported on 12/19/2018)   hydrocerin (EUCERIN) CREA Apply 1 application topically 2 (two) times daily. (Patient not taking: Reported on 01/14/2019)   nicotine (NICODERM CQ - DOSED IN MG/24 HOURS) 14 mg/24hr patch Place 1 patch (14 mg total) onto the skin daily as needed (smoking craving). (Patient not taking: Reported on 01/14/2019)   No facility-administered encounter medications on file as of 01/14/2019.     Activities of Daily Living In your present state of health, do you have any difficulty performing the following activities: 01/14/2019 12/19/2018  Hearing? Y Y  Comment decreased hearing in right ear -  Vision? Y Y  Comment blurry vision -  Difficulty concentrating or making decisions? N N  Walking or climbing stairs? Y Y  Dressing or bathing? Tempie Donning  Comment takes longer time -  Doing errands, shopping? Y -  Comment always someone present -  Preparing Food and eating ? Y -  Comment daughter is preparing -  Using the Toilet? N -  In the past six months, have you accidently leaked urine? Y -  Comment all the time, sometimes wears a pad -  Do you have problems with loss of bowel control? Y -  Managing your Medications? Y -  Managing your Finances? Y -  Housekeeping or managing your Housekeeping? Y -  Some recent data might be hidden    Patient Care Team: [name], FNP as PCP - General ([place]) Rexene Agent, MD as Attending Physician (Nephrology) [facility], [facility] [place], Tillie Rung as Social Worker [place], Claudette Stapler, RN as Case Manager    Assessment:   This is a routine wellness examination for [place].  Exercise Activities and Dietary recommendations Current Exercise  Habits: The patient does not participate in regular exercise at present  Goals     "I need a biopsy to determine what  medication will help" (pt-stated)     Current Barriers:   Limited testing to identify lesion in L lung  Clinical Social Work Clinical Goal(s):   Over the next 30 days the patient will attend lung biopsy as scheduled on 12/04/18 to assist with tissue identification  Over the next 60 days the patient will become more knowledgeable of diagnosis as evidenced by ability to verbalize treatment plan  CCM RN CM Interventions: 12/27/18 call completed with patients daughter Jessica Clarke   Evaluation of current treatment plan related to Bronchoscopy for Lung Bx and patient's adherence to plan as established by provider  Discussed patient completed her Oncology consultation on 12/26/18 and has been given a 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 o I recommended for the patient to have repeat PET scan next week for further evaluation of her condition. o If the PET scan is negative for metastatic disease outside the chest, will consider the patient for ultrasound-guided right thoracentesis to rule out the presence of malignant pleural effusion. o If the pleural fluid is negative for malignancy and no evidence of metastatic disease outside the chest, the patient will be treated for stage IIIc non-small cell lung cancer with concurrent chemoradiation with weekly carboplatin and paclitaxel. o I will arrange for the patient to come back for follow-up visit in 2 weeks for evaluation and more detailed discussion of her treatment options after the staging work-up."  Discussed plans with patient for ongoing care management follow up and  provided patient with direct contact information for care management team  Reviewed scheduled/upcoming provider appointments including: PET scan, f/u with Dr. Valeta Harms and PCP provider [name], FNP  Patient Self Care Activities:   Self administers medications as prescribed  Attends all scheduled provider appointments  Calls provider office for new concerns or questions   Please see past updates related to this goal by clicking on the "Past Updates" button in the selected goal       "I would like my mom to have a CNA in her home"     Daughter stated:  Current Barriers:   Limited knowledge of health plan benefits  Social Work Clinical Goal(s):   Over the next 20 days, the patient will work with SW to determined health plan benefits.  Over the next 45 days the patient and her daughter will become more knowledgeable of community resources available to assist with care needs  CCM SW Interventions: Completed 12/30/2018  Outbound call to the patients daughter to assess progression of patient goal. Jessica Clarke reports she has not yet heard from [facility] regarding assessment  Outbound call to [facility], [facility] with [facility] who reports patient assessment completed on 11/12. Patient PCS hours accepted by [facility] on 12/30/2018  Contacted Jessica Clarke to confirm patient caregiver hours accepted by [facility] to contact SW if she has not heard from [place] by Thursday 11/19  CCM RN CM Interventions:  12/19/18 call completed with daughter [name] call received from daughter Jessica Clarke to advise Ms. Crooke has been approved for her own apartment; discussed Ms. Kreiser will require a form to be completed by PCP provider indicating the patient will require 24 hour supervised care Provided Jessica Clarke with the  fax # for [facility]; she will fax this form to [name], FNP attention Sent in basket message to embedded [place] with an  update  Patient Self Care Activities:   Self administers medications as prescribed  Attends all scheduled provider appointments  Calls provider office for new concerns or questions  Please see past updates related to this goal by clicking on the "Past Updates" button in the selected goal       "We haven't heard from the stomach doctor"     Daughter stated Current Barriers:   Knowledge Deficits related to treatment management of Gastrointestinal hemorrhage and suspicious Colon polyp  Nurse Case Manager Clinical Goal(s):   Over the next 30 days, patient will have a new patient appointment with Gastroenterology for evaluation and treatment of recent GI hemorrhage and Colon polyp COMPLETED  New 12/19/18 - Over the next 30 days, patient will have a good understanding of the plan for ongoing treatment management of her GI symptoms and will adhere to those recommendations  CCM RN CM Interventions:  12/27/18 call completed with patients daughter Jessica Clarke    Discussed patient's recent GI f/u to evaluate the prescribed plan of care; daughter Jessica Clarke advised the patient had to provide new stool samples, she is unsure why due to not entering the [place] with her mother  Reviewed and discussed it appears the previous stool samples were inconclusive therefore requiring new samples in order to determine if disease is present contributing to patient's symptoms  Discussed patient's symptoms have improved but have not resolved   Reviewed the patient's next steps are to try Ibgard, in which samples were provided for bloating and can be purchased over-the-counter; the GI office will call you with results  Evaluation of current treatment plan related to GI symptoms and patient's adherence to plan as established by provider.  Discussed plans with patient for ongoing care management follow up and provided patient with direct contact information for care management team  Patient Self Care Activities:    Unable to independently to manage Chronic conditions  Please see past updates related to this goal by clicking on the "Past Updates" button in the selected goal       Patient Stated     01/14/2019, no goals set     To assist patient with obtaining an Oxygen Concentrator     Current Barriers:   Knowledge Deficits related to how to obtain an Oxygen concentrator for continuous Oxygen use  Non adherence to continuous daytime Oxygen therapy  Nurse Case Manager Clinical Goal(s):   Over the next 30 days, patient will work with CCM RN and PCP provider to address needs related to obtaining an order and Care Coordination for an Oxygen Concentrator for continuous Oxygen therapy  Over the next 90 days, patient will report 100% of adherence to wearing her Oxygen exactly as prescribed  CCM RN CM Interventions:  12/27/18 call completed with daughter Jessica Clarke    Evaluation of current treatment plan related to continuous Oxygen therapy and patient's adherence to plan as established by provider.  Collaborated with [facility], spoke with [facility] regarding Rx status for patient's Oxygen concentrator and rollator walker; Rep advised an Rx was not received for these items; She advised to refax to [facility] fax # [phone]   Discussed plans with patient for ongoing care management follow up and provided patient with direct contact information for care management team  Sent in basket message to provider [name], FNP with request to resend  the Rx for the Oxygen concentrator and rollator walker, providing patient's weight/height, provided the fax # for [facility]   01/03/19 Placed outbound call to [place] [phone]  Spoke to [place] who advised this patient receives DME from [facility] in [place]   Determined orders have not been received for the Oxygen Concentrator for this patient  Discussed the orders should be refaxed to [place] [phone]  Sent in basket  message to provider [name], FNP with request to resend the orders for the Oxygen concentrator, provided the fax information needed  Patient Self Care Activities:   Self administers medications as prescribed  Attends all scheduled provider appointments  Calls pharmacy for medication refills  Performs ADL's independently  Performs IADL's independently  Calls provider office for new concerns or questions   Please see past updates related to this goal by clicking on the "Past Updates" button in the selected goal         Fall Risk Fall Risk  01/14/2019 11/19/2018 09/11/2018  Falls in the past year? 1 1 (No Data)  Comment lost balance - Emmi Telephone Survey: data to providers prior to load  Number falls in past yr: 1 0 (No Data)  Comment - - Emmi Telephone Survey Actual Response =   Injury with Fall? 0 1 -  Comment laceration near eye - -  Risk for fall due to : Impaired balance/gait;Impaired mobility;Medication side effect - -  Follow up Falls evaluation completed;Falls prevention discussed - -   Is the patient's home free of loose throw rugs in walkways, pet beds, electrical cords, etc?   yes      Grab bars in the bathroom? yes      Handrails on the stairs?   n/a      Adequate lighting?   yes  Timed Get Up and Go performed: n/a  Depression Screen PHQ 2/9 Scores 01/14/2019 11/19/2018  PHQ - 2 Score 0 0  PHQ- 9 Score 6 -     Cognitive Function     6CIT Screen 01/14/2019  What Year? 0 points  What month? 0 points  What time? 0 points  Count back from 20 0 points  Months in reverse 4 points  Repeat phrase 10 points  Total Score 14    Immunization History  Administered Date(s) Administered   Hepatitis B, adult 11/28/2013, 01/02/2014, 01/30/2014, 05/25/2014   Influenza, High Dose Seasonal PF 11/07/2017, 11/11/2018   Pneumococcal Conjugate-13 12/08/2016   Pneumococcal Polysaccharide-23 11/26/2015   Td 07/18/2017    Qualifies for Shingles Vaccine?  yes  Screening Tests Health Maintenance  Topic Date Due   MAMMOGRAM  03/03/2012   DEXA SCAN  01/14/2020 (Originally 02/03/2014)   COLONOSCOPY  10/20/2023   TETANUS/TDAP  07/19/2027   INFLUENZA VACCINE  Completed   Hepatitis C Screening  Completed   PNA vac Low Risk Adult  Completed    Cancer Screenings: Lung: Low Dose CT Chest recommended if Age 63-80 years, 30 pack-year currently smoking OR have quit w/in 15years. Patient does not qualify. Breast:  Up to date on Mammogram? No   Up to date of Bone Density/Dexa? No Colorectal: up to date  Additional Screenings: : Hepatitis C Screening: 11/19/2018     Plan:    Patient has no goals set at this time.   I have personally reviewed and noted the following in the patients chart:    Medical and social history  Use of alcohol, tobacco or illicit drugs  Current medications and supplements  Functional ability and status  Nutritional status  Physical activity  Advanced directives  List of other physicians  Hospitalizations, surgeries, and ER visits in previous 12 months  Vitals  Screenings to include cognitive, depression, and falls  Referrals and appointments  In addition, I have reviewed and discussed with patient certain preventive protocols, quality metrics, and best practice recommendations. A written personalized care plan for preventive services as well as general preventive health recommendations were provided to patient.     Kellie Simmering, LPN  [id number]/02/[id number]

## 2019-01-15 ENCOUNTER — Other Ambulatory Visit: Payer: Self-pay

## 2019-01-15 ENCOUNTER — Encounter: Payer: Self-pay | Admitting: Pulmonary Disease

## 2019-01-15 ENCOUNTER — Ambulatory Visit (INDEPENDENT_AMBULATORY_CARE_PROVIDER_SITE_OTHER): Payer: Medicare Other | Admitting: Pulmonary Disease

## 2019-01-15 DIAGNOSIS — F17200 Nicotine dependence, unspecified, uncomplicated: Secondary | ICD-10-CM | POA: Diagnosis not present

## 2019-01-15 DIAGNOSIS — R918 Other nonspecific abnormal finding of lung field: Secondary | ICD-10-CM | POA: Diagnosis not present

## 2019-01-15 DIAGNOSIS — R590 Localized enlarged lymph nodes: Secondary | ICD-10-CM | POA: Diagnosis not present

## 2019-01-15 DIAGNOSIS — C3491 Malignant neoplasm of unspecified part of right bronchus or lung: Secondary | ICD-10-CM | POA: Diagnosis not present

## 2019-01-15 MED ORDER — IPRATROPIUM-ALBUTEROL 0.5-2.5 (3) MG/3ML IN SOLN: 3.0000 mL | Freq: Four times a day (QID) | RESPIRATORY_TRACT | 5 refills | Status: DC | PRN

## 2019-01-15 NOTE — Patient Instructions (Signed)
Thank you for visiting Dr. Quintell Bonnin at  Pulmonary. Today we recommend the following: No orders of the defined types were placed in this encounter.  No orders of the defined types were placed in this encounter.  No follow-ups on file.    Please do your part to reduce the spread of COVID-19.    

## 2019-01-15 NOTE — Progress Notes (Signed)
Synopsis: Referred in October 2020 for mediastinal adenopathy, lung mass by [name], FNP  Subjective:   PATIENT ID: Jessica Clarke GENDER: female DOB: 18-Jun-1950, MRN: [id number]  Chief Complaint  Patient presents with  . Follow-up    4wk f/u. Increased cough and stomach pain. Clear mucus.     Previous office visit: 68 year old female with past medical history of tobacco abuse, history of hyperlipidemia, hypertension, chronic kidney disease, gastroesophageal reflux disease.  Patient was found to have abnormal chest imaging in November 12, 2018.  She was found to have a large lung mass as well as mediastinal adenopathy concerning for an advanced stage lung cancer.  Patient was referred to Dr. Lew Dawes office at medical oncology who has referred the patient for [place] for evaluation of bronchoscopy.  Patient here today with no significant symptoms except fatigue and weight loss.  She is unfortunately still smoking.  We talked about this today in the office.  Patient denies hemoptysis.  She overall is doing okay.  Her daughter is present with her today in the office.    OV 01/15/2019:  ______________________________________________________  Virtual Visit via Telephone Note  I connected withLeronia Clarke on 01/15/19 at 11:45 AM EST by telephoneand verified that I am speaking with the correct person using two identifiers.  Location: [facility]  Patient: Jessica Clarke  Provider: Garner Nash, DO   I discussed the limitations, risks, security and privacy concerns of performing an evaluation and management service by telephone and the availability of in person appointments. I also discussed with the patient that there may be a patient responsible charge related to this service. The patient expressed understanding and agreed to proceed.  History of Present Illness:  68 year old female since last office visit was taken for video bronchoscopy with transbronchial needle aspiration  biopsies.  Station 4 R+ for adenocarcinoma.  Patient had follow-up with Dr. Earlie Server on 12/26/2018.  Clinical diagnosis of stage IIIb/IV possible M1 a disease of right supraclavicular node and she does have a suspicious small pleural effusion consistent with potential stage IV disease.  She has a PET scan pending.  She has not started therapy.  She has been seen by home palliative care already.  Her breathing is stable.  Unfortunately she is continuing to smoke.  She would like to have a nebulizer.  Observations/Objective: Patient able to speak in full sentences.  Nonlabored breathing.  She occasionally does have coughing while we speak.  Previous pathology results reviewed, 4R tissue biopsy positive adenocarcinoma  Assessment and Plan:  Advanced stage bronchogenic carcinoma, adenocarcinoma of the lung, likely stage IV disease, supraclavicular adenopathy as well as small pleural effusion.  Pleural effusion has not been tapped. Likely COPD Current smoker No PFTs  Plan: If patient develops malignant pleural effusion we would be happy to place Pleurx catheter in the future if needed. We will start her on nebulized albuterol plus ipratropium every 6 hours as needed for shortness of breath and wheezing. Agree with ongoing palliative discussions at home. Encouraged patient to keep follow-up appointment with Dr. Earlie Server which is scheduled for tomorrow.  Follow Up Instructions:  Return to clinic in 6 to 8 weeks.  Or as needed.  Telephone visit would be fine for check-in with myself or app.  I discussed the assessment and treatment plan with the patient. The patient was provided an opportunity to ask questions and all were answered. The patient agreed with the plan and demonstrated an understanding of the instructions.  The patient was advised  to call back or seek an in-person evaluation if the symptoms worsen or if the condition fails to improve as anticipated.  I provided 17 minutes of  non-face-to-face time during this encounter.   Garner Nash, DO      Past Medical History:  Diagnosis Date  . Abnormal Pap smear    ASCUS ?HGSIL  . Anemia   . Arthritis   . Cancer ([place])    lung ( per daughter)  . Cervical dysplasia   . CHF (congestive heart failure) ([place])   . Chronic kidney disease   . Constipation   . Constipation   . Dyspnea   . GERD (gastroesophageal reflux disease)   . Gout   . Hyperlipidemia   . Hypertension   . Myocardial infarction ([place]) 2015  . Obesity   . Pneumonia   . Uterine polyp      Family History  Problem Relation Age of Onset  . Esophageal cancer Other   . Pancreatic cancer Other   . Heart disease Mother   . Hypertension Mother   . Breast cancer Sister   . Esophageal cancer Sister   . Cancer Sister   . Deep vein thrombosis Sister   . Diabetes Sister   . Hypertension Sister   . Hypertension Daughter   . Colon cancer Neg Hx      Past Surgical History:  Procedure Laterality Date  . AV FISTULA PLACEMENT  01/18/2012   Procedure: ARTERIOVENOUS (AV) FISTULA CREATION;  Surgeon: Angelia Mould, MD;  Location: [place] [facility];  Service: Vascular;  Laterality: Left;  . BIOPSY  10/20/2018   Procedure: BIOPSY;  Surgeon: Thornton Park, MD;  Location: [place];  Service: Gastroenterology;;  . COLONOSCOPY WITH PROPOFOL N/A 10/20/2018   Procedure: COLONOSCOPY WITH PROPOFOL;  Surgeon: Thornton Park, MD;  Location: [place];  Service: Gastroenterology;  Laterality: N/A;  . DILATION AND CURETTAGE OF UTERUS    . HYSTEROSCOPY    . LEEP    . LEFT HEART CATHETERIZATION WITH CORONARY ANGIOGRAM N/A 08/26/2013   Procedure: LEFT HEART CATHETERIZATION WITH CORONARY ANGIOGRAM;  Surgeon: Leonie Man, MD;  Location: [facility] CATH LAB;  Service: Cardiovascular;  Laterality: N/A;  . POLYPECTOMY  10/20/2018   Procedure: POLYPECTOMY;  Surgeon: Thornton Park, MD;  Location: [facility] ENDOSCOPY;  Service: Gastroenterology;;  . REVISON OF ARTERIOVENOUS  FISTULA Left 05/31/4079   Procedure: PLICATION OF ARTERIOVENOUS FISTULA;  Surgeon: Conrad Greenbush, MD;  Location: [place];  Service: Vascular;  Laterality: Left;  Marland Kitchen VIDEO BRONCHOSCOPY WITH ENDOBRONCHIAL ULTRASOUND N/A 12/19/2018   Procedure: VIDEO BRONCHOSCOPY WITH ENDOBRONCHIAL ULTRASOUND;  Surgeon: Garner Nash, DO;  Location: [facility] OR;  Service: Thoracic;  Laterality: N/A;    Social History   Socioeconomic History  . Marital status: Married    Spouse name: Not on file  . Number of children: 1  . Years of education: Not on file  . Highest education level: Not on file  Occupational History    Employer: UNEMPLOYED    Comment: Unemployed   Social Needs  . Financial resource strain: Not hard at all  . Food insecurity    Worry: Never true    Inability: Never true  . Transportation needs    Medical: No    Non-medical: No  Tobacco Use  . Smoking status: Current Every Day Smoker    Packs/day: 0.25    Years: 30.00    Pack years: 7.50    Types: Cigarettes  . Smokeless tobacco: Never Used  .  Tobacco comment: smoking cessation  Substance and Sexual Activity  . Alcohol use: Not Currently  . Drug use: No  . Sexual activity: Not Currently    Birth control/protection: Post-menopausal    Comment: Did not ask  Lifestyle  . Physical activity    Days per week: 0 days    Minutes per session: 0 min  . Stress: Not at all  Relationships  . Social Herbalist on phone: Not on file    Gets together: Not on file    Attends religious service: Not on file    Active member of club or organization: Not on file    Attends meetings of clubs or organizations: Not on file    Relationship status: Not on file  . Intimate partner violence    Fear of current or ex partner: Not on file    Emotionally abused: Not on file    Physically abused: Not on file    Forced sexual activity: Not on file  Other Topics Concern  . Not on file  Social History Narrative  . Not on file     No Known  Allergies   Outpatient Medications Prior to Visit  Medication Sig Dispense Refill  . acetaminophen (TYLENOL) 325 MG tablet Take 325-650 mg by mouth every 8 (eight) hours as needed for mild pain.     Marland Kitchen allopurinol (ZYLOPRIM) 100 MG tablet Take 100 mg by mouth daily.      . Amino Acids-Protein Hydrolys (FEEDING SUPPLEMENT, PRO-STAT SUGAR FREE 64,) LIQD Take 30 mLs by mouth 2 (two) times daily. 887 mL 0  . aspirin EC 325 MG tablet Take 1 tablet (325 mg total) by mouth daily. 30 tablet 0  . atenolol (TENORMIN) 100 MG tablet Take 100 mg by mouth daily.     Marland Kitchen atorvastatin (LIPITOR) 10 MG tablet Take 10 mg by mouth at bedtime.   0  . B Complex-Folic Acid (B COMPLEX FORMULA 1) TABS Take 1 tablet by mouth daily.     . calcitRIOL (ROCALTROL) 0.5 MCG capsule Take 2 capsules (1 mcg total) by mouth every Monday, Wednesday, and Friday with hemodialysis. 30 capsule 0  . cinacalcet (SENSIPAR) 30 MG tablet Take 1 tablet (30 mg total) by mouth daily with supper. 60 tablet 0  . colchicine 0.6 MG tablet Take 0.6 mg by mouth daily as needed (as directed for gout flares or pain).     . Darbepoetin Alfa (ARANESP) 60 MCG/0.3ML SOSY injection Inject 0.3 mLs (60 mcg total) into the vein every Friday with hemodialysis. 4.2 mL   . hydrocerin (EUCERIN) CREA Apply 1 application topically 2 (two) times daily. 228 g 0  . loperamide (IMODIUM) 2 MG capsule Take 2 mg by mouth daily as needed for diarrhea or loose stools.     . midodrine (PROAMATINE) 10 MG tablet Take 1 tablet (10 mg total) by mouth 2 (two) times daily with a meal. 60 tablet 0  . multivitamin (RENA-VIT) TABS tablet Take 1 tablet by mouth at bedtime. 30 tablet 0  . nicotine (NICODERM CQ - DOSED IN MG/24 HOURS) 14 mg/24hr patch Place 1 patch (14 mg total) onto the skin daily as needed (smoking craving). 28 patch 0  . thiamine (VITAMIN B-1) 100 MG tablet Take 100 mg by mouth daily.    . VELPHORO 500 MG chewable tablet Chew 1,000 mg by mouth 3 (three) times daily with  meals.     . Vitamin D, Cholecalciferol, 25 MCG (1000 UT) TABS  Take 1,000 Units by mouth daily.      No facility-administered medications prior to visit.     Review of Systems     Objective:  Physical Exam   There were no vitals filed for this visit.   on RA BMI Readings from Last 3 Encounters:  01/14/19 20.92 kg/m  12/27/18 23.78 kg/m  12/26/18 21.97 kg/m   Wt Readings from Last 3 Encounters:  01/14/19 150 lb (68 kg)  12/27/18 156 lb 6.4 oz (70.9 kg)  12/26/18 153 lb 1.6 oz (69.4 kg)     CBC    Component Value Date/Time   WBC 4.5 12/26/2018 1339   WBC 4.3 12/19/2018 0900   RBC 3.05 (L) 12/26/2018 1339   HGB 9.8 (L) 12/26/2018 1339   HGB 7.5 (L) 11/19/2018 1640   HCT 30.6 (L) 12/26/2018 1339   HCT 23.0 (L) 11/19/2018 1640   PLT 115 (L) 12/26/2018 1339   PLT 216 11/19/2018 1640   MCV 100.3 (H) 12/26/2018 1339   MCV 100 (H) 11/19/2018 1640   MCH 32.1 12/26/2018 1339   MCHC 32.0 12/26/2018 1339   RDW 16.0 (H) 12/26/2018 1339   RDW 17.0 (H) 11/19/2018 1640   LYMPHSABS 0.8 12/26/2018 1339   MONOABS 0.5 12/26/2018 1339   EOSABS 0.1 12/26/2018 1339   BASOSABS 0.0 12/26/2018 1339    Chest Imaging: 11/12/2018: CT chest Interval growth of a right upper lobe 3.1 cm right upper lobe lung mass concerning for bronchogenic carcinoma with interval progression of the right paratracheal lymphadenopathy concerning for metastatic disease. The patient's images have been independently reviewed by me.    Pulmonary Functions Testing Results: No flowsheet data found.  FeNO: None   Pathology:   Bronchoscopy 12/19/2018: B. LYMPH NODE, 4R, FINE NEEDLE ASPIRATION:  - Metastatic carcinoma  COMMENT: The neoplastic cells are positive for TTF-1 but negative for cytokeratin  5/6 and p40. Overall, the features are consistent with a clinical  impression of metastatic lung adenocarcinoma.   Echocardiogram: None   Heart Catheterization: None     Assessment & Plan:      ICD-10-CM   1. Adenocarcinoma of right lung, stage 3 (HCC)  C34.91   2. Mass of upper lobe of right lung  R91.8   3. Mediastinal adenopathy  R59.0   4. Hilar adenopathy  R59.0    Discussion:    Current Outpatient Medications:  .  acetaminophen (TYLENOL) 325 MG tablet, Take 325-650 mg by mouth every 8 (eight) hours as needed for mild pain. , Disp: , Rfl:  .  allopurinol (ZYLOPRIM) 100 MG tablet, Take 100 mg by mouth daily.  , Disp: , Rfl:  .  Amino Acids-Protein Hydrolys (FEEDING SUPPLEMENT, PRO-STAT SUGAR FREE 64,) LIQD, Take 30 mLs by mouth 2 (two) times daily., Disp: 887 mL, Rfl: 0 .  aspirin EC 325 MG tablet, Take 1 tablet (325 mg total) by mouth daily., Disp: 30 tablet, Rfl: 0 .  atenolol (TENORMIN) 100 MG tablet, Take 100 mg by mouth daily. , Disp: , Rfl:  .  atorvastatin (LIPITOR) 10 MG tablet, Take 10 mg by mouth at bedtime. , Disp: , Rfl: 0 .  B Complex-Folic Acid (B COMPLEX FORMULA 1) TABS, Take 1 tablet by mouth daily. , Disp: , Rfl:  .  calcitRIOL (ROCALTROL) 0.5 MCG capsule, Take 2 capsules (1 mcg total) by mouth every Monday, Wednesday, and Friday with hemodialysis., Disp: 30 capsule, Rfl: 0 .  cinacalcet (SENSIPAR) 30 MG tablet, Take 1 tablet (30 mg total)  by mouth daily with supper., Disp: 60 tablet, Rfl: 0 .  colchicine 0.6 MG tablet, Take 0.6 mg by mouth daily as needed (as directed for gout flares or pain). , Disp: , Rfl:  .  Darbepoetin Alfa (ARANESP) 60 MCG/0.3ML SOSY injection, Inject 0.3 mLs (60 mcg total) into the vein every Friday with hemodialysis., Disp: 4.2 mL, Rfl:  .  hydrocerin (EUCERIN) CREA, Apply 1 application topically 2 (two) times daily., Disp: 228 g, Rfl: 0 .  loperamide (IMODIUM) 2 MG capsule, Take 2 mg by mouth daily as needed for diarrhea or loose stools. , Disp: , Rfl:  .  midodrine (PROAMATINE) 10 MG tablet, Take 1 tablet (10 mg total) by mouth 2 (two) times daily with a meal., Disp: 60 tablet, Rfl: 0 .  multivitamin (RENA-VIT) TABS tablet, Take 1  tablet by mouth at bedtime., Disp: 30 tablet, Rfl: 0 .  nicotine (NICODERM CQ - DOSED IN MG/24 HOURS) 14 mg/24hr patch, Place 1 patch (14 mg total) onto the skin daily as needed (smoking craving)., Disp: 28 patch, Rfl: 0 .  thiamine (VITAMIN B-1) 100 MG tablet, Take 100 mg by mouth daily., Disp: , Rfl:  .  VELPHORO 500 MG chewable tablet, Chew 1,000 mg by mouth 3 (three) times daily with meals. , Disp: , Rfl:  .  Vitamin D, Cholecalciferol, 25 MCG (1000 UT) TABS, Take 1,000 Units by mouth daily. , Disp: , Rfl:    Garner Nash, DO [facility] 01/15/2019 9:59 AM

## 2019-01-16 ENCOUNTER — Telehealth: Payer: Self-pay | Admitting: Pulmonary Disease

## 2019-01-16 ENCOUNTER — Encounter (HOSPITAL_COMMUNITY)
Admission: RE | Admit: 2019-01-16 | Discharge: 2019-01-16 | Disposition: A | Discharge status: Home / Self Care | Payer: Medicare Other | Source: Ambulatory Visit | Attending: Internal Medicine | Admitting: Internal Medicine

## 2019-01-16 ENCOUNTER — Inpatient Hospital Stay: Payer: Medicare Other

## 2019-01-16 ENCOUNTER — Other Ambulatory Visit: Payer: Self-pay

## 2019-01-16 ENCOUNTER — Telehealth: Payer: Self-pay | Admitting: Internal Medicine

## 2019-01-16 ENCOUNTER — Telehealth: Payer: Self-pay | Admitting: *Deleted

## 2019-01-16 ENCOUNTER — Inpatient Hospital Stay: Payer: Medicare Other | Attending: Internal Medicine | Admitting: Physician Assistant

## 2019-01-16 ENCOUNTER — Telehealth: Payer: Self-pay

## 2019-01-16 DIAGNOSIS — J90 Pleural effusion, not elsewhere classified: Secondary | ICD-10-CM | POA: Diagnosis not present

## 2019-01-16 DIAGNOSIS — I2510 Atherosclerotic heart disease of native coronary artery without angina pectoris: Secondary | ICD-10-CM | POA: Insufficient documentation

## 2019-01-16 DIAGNOSIS — K5730 Diverticulosis of large intestine without perforation or abscess without bleeding: Secondary | ICD-10-CM | POA: Diagnosis not present

## 2019-01-16 DIAGNOSIS — C3490 Malignant neoplasm of unspecified part of unspecified bronchus or lung: Secondary | ICD-10-CM

## 2019-01-16 DIAGNOSIS — R188 Other ascites: Secondary | ICD-10-CM | POA: Insufficient documentation

## 2019-01-16 DIAGNOSIS — C3411 Malignant neoplasm of upper lobe, right bronchus or lung: Secondary | ICD-10-CM | POA: Insufficient documentation

## 2019-01-16 DIAGNOSIS — J438 Other emphysema: Secondary | ICD-10-CM | POA: Diagnosis not present

## 2019-01-16 LAB — CBC WITH DIFFERENTIAL (CANCER CENTER ONLY)
Abs Immature Granulocytes: 0.01 10*3/uL (ref 0.00–0.07)
Basophils Absolute: 0 10*3/uL (ref 0.0–0.1)
Basophils Relative: 1 %
Eosinophils Absolute: 0 10*3/uL (ref 0.0–0.5)
Eosinophils Relative: 1 %
HCT: 30.9 % — ABNORMAL LOW (ref 36.0–46.0)
Hemoglobin: 10 g/dL — ABNORMAL LOW (ref 12.0–15.0)
Immature Granulocytes: 0 %
Lymphocytes Relative: 18 %
Lymphs Abs: 0.8 10*3/uL (ref 0.7–4.0)
MCH: 31.1 pg (ref 26.0–34.0)
MCHC: 32.4 g/dL (ref 30.0–36.0)
MCV: 96 fL (ref 80.0–100.0)
Monocytes Absolute: 0.6 10*3/uL (ref 0.1–1.0)
Monocytes Relative: 14 %
Neutro Abs: 2.9 10*3/uL (ref 1.7–7.7)
Neutrophils Relative %: 66 %
Platelet Count: 141 10*3/uL — ABNORMAL LOW (ref 150–400)
RBC: 3.22 MIL/uL — ABNORMAL LOW (ref 3.87–5.11)
RDW: 16.8 % — ABNORMAL HIGH (ref 11.5–15.5)
WBC Count: 4.4 10*3/uL (ref 4.0–10.5)
nRBC: 0 % (ref 0.0–0.2)

## 2019-01-16 LAB — CMP (CANCER CENTER ONLY)
ALT: 7 U/L (ref 0–44)
AST: 18 U/L (ref 15–41)
Albumin: 2.7 g/dL — ABNORMAL LOW (ref 3.5–5.0)
Alkaline Phosphatase: 175 U/L — ABNORMAL HIGH (ref 38–126)
Anion gap: 10 (ref 5–15)
BUN: 25 mg/dL — ABNORMAL HIGH (ref 8–23)
CO2: 29 mmol/L (ref 22–32)
Calcium: 9 mg/dL (ref 8.9–10.3)
Chloride: 96 mmol/L — ABNORMAL LOW (ref 98–111)
Creatinine: 3.76 mg/dL (ref 0.44–1.00)
GFR, Est AFR Am: 13 mL/min — ABNORMAL LOW (ref >60)
GFR, Estimated: 12 mL/min — ABNORMAL LOW (ref >60)
Glucose, Bld: 74 mg/dL (ref 70–99)
Potassium: 3.9 mmol/L (ref 3.5–5.1)
Sodium: 135 mmol/L (ref 135–145)
Total Bilirubin: 0.9 mg/dL (ref 0.3–1.2)
Total Protein: 7.5 g/dL (ref 6.5–8.1)

## 2019-01-16 LAB — GLUCOSE, CAPILLARY: Glucose-Capillary: 76 mg/dL (ref 70–99)

## 2019-01-16 MED ORDER — FLUDEOXYGLUCOSE F - 18 (FDG) INJECTION
8.8000 | Freq: Once | INTRAVENOUS | Status: AC
  Administered 2019-01-16: 8.8 via INTRAVENOUS

## 2019-01-16 NOTE — Telephone Encounter (Signed)
Confirmed 12/8 appointment with dtr.

## 2019-01-16 NOTE — Telephone Encounter (Signed)
Oncology Nurse Navigator Documentation  Oncology Nurse Navigator Flowsheets 01/16/2019  Abnormal Finding Date -  Confirmed Diagnosis Date -  Diagnosis Status -  Navigator Follow Up Date: -  Navigator Follow Up Reason: -  Navigator Location [facility]  Navigator Encounter Type Telephone/[name] updated me that patient was a no show to her appt with [name] today.  I called the daughter and she states she was not aware of her appt with [name] today.  I did remind the daughter of patient's PET scan today and she verbalized understanding of that appt.  Telephone Outgoing Call  [facility] Date -  Multidisiplinary Clinic Type -  Patient Visit Type -  Treatment Phase -  Barriers/Navigation Needs Education  Education Other  Interventions Education  Acuity Level 2-Minimal Needs (1-2 Barriers Identified)  Coordination of Care -  Education Method -  Time Spent with Patient 15

## 2019-01-16 NOTE — Telephone Encounter (Signed)
Spoke with Tiffany and gave all of the pulmonary dx under problem list in her chart  Nothing further needed

## 2019-01-16 NOTE — Telephone Encounter (Signed)
Critical Value:  Creatinine 3.76

## 2019-01-20 ENCOUNTER — Other Ambulatory Visit: Payer: Self-pay

## 2019-01-20 ENCOUNTER — Encounter: Payer: Self-pay | Admitting: Nurse Practitioner

## 2019-01-20 ENCOUNTER — Ambulatory Visit (INDEPENDENT_AMBULATORY_CARE_PROVIDER_SITE_OTHER): Payer: Medicare Other | Admitting: Nurse Practitioner

## 2019-01-20 VITALS — BP 118/70 | HR 63 | Temp 98.1°F | Ht 68.8 in | Wt 153.2 lb

## 2019-01-20 DIAGNOSIS — I120 Hypertensive chronic kidney disease with stage 5 chronic kidney disease or end stage renal disease: Secondary | ICD-10-CM

## 2019-01-20 DIAGNOSIS — H538 Other visual disturbances: Secondary | ICD-10-CM

## 2019-01-20 DIAGNOSIS — R040 Epistaxis: Secondary | ICD-10-CM

## 2019-01-20 DIAGNOSIS — Z992 Dependence on renal dialysis: Secondary | ICD-10-CM

## 2019-01-20 DIAGNOSIS — R05 Cough: Secondary | ICD-10-CM | POA: Diagnosis not present

## 2019-01-20 DIAGNOSIS — Z20828 Contact with and (suspected) exposure to other viral communicable diseases: Secondary | ICD-10-CM

## 2019-01-20 DIAGNOSIS — R0981 Nasal congestion: Secondary | ICD-10-CM

## 2019-01-20 DIAGNOSIS — C3490 Malignant neoplasm of unspecified part of unspecified bronchus or lung: Secondary | ICD-10-CM

## 2019-01-20 DIAGNOSIS — N186 End stage renal disease: Secondary | ICD-10-CM | POA: Diagnosis not present

## 2019-01-20 DIAGNOSIS — C3491 Malignant neoplasm of unspecified part of right bronchus or lung: Secondary | ICD-10-CM | POA: Diagnosis not present

## 2019-01-20 DIAGNOSIS — I10 Essential (primary) hypertension: Secondary | ICD-10-CM

## 2019-01-20 DIAGNOSIS — R0602 Shortness of breath: Secondary | ICD-10-CM | POA: Diagnosis not present

## 2019-01-20 DIAGNOSIS — R059 Cough, unspecified: Secondary | ICD-10-CM

## 2019-01-20 NOTE — Progress Notes (Signed)
This visit occurred during the SARS-CoV-2 public health emergency.  Safety protocols were in place, including screening questions prior to the visit, additional usage of staff PPE, and extensive cleaning of exam room while observing appropriate contact time as indicated for disinfecting solutions.  Subjective:     Patient ID: Jessica Clarke , female    DOB: 1950-05-29 , 68 y.o.   MRN: [id number]   Chief Complaint  Patient presents with  . Hypertension    HPI  She has been diagnosed with Lung Cancer and is to see Dr. Inda Merlin to discuss treatment options.    Continues to get MWF - she rescheduled her dialysis for Wednesday to come to this appt.    Her eyes stay cloudy all the time.  She feels like they get milky.  This has been ongoing. She will sometimes have a nose bleed at times.  She has also mentioned this to dialysis.  She had a heavy nose bleed about 2 weeks ago. None since that time.    Hypertension This is a chronic problem. The current episode started more than 1 year ago. The problem is controlled. Pertinent negatives include no anxiety, chest pain or palpitations. There are no associated agents to hypertension. Risk factors for coronary artery disease include sedentary lifestyle. Past treatments include beta blockers. There are no compliance problems.  There is no history of angina. Identifiable causes of hypertension include chronic renal disease (dialysis).  URI  Pertinent negatives include no chest pain.     Past Medical History:  Diagnosis Date  . Abnormal Pap smear    ASCUS ?HGSIL  . Anemia   . Arthritis   . Cancer ([place])    lung ( per daughter)  . Cervical dysplasia   . CHF (congestive heart failure) ([place])   . Chronic kidney disease   . Constipation   . Constipation   . Dyspnea   . GERD (gastroesophageal reflux disease)   . Gout   . Hyperlipidemia   . Hypertension   . Myocardial infarction ([place]) 2015  . Obesity   . Pneumonia   . Uterine polyp       Family History  Problem Relation Age of Onset  . Esophageal cancer Other   . Pancreatic cancer Other   . Heart disease Mother   . Hypertension Mother   . Breast cancer Sister   . Esophageal cancer Sister   . Cancer Sister   . Deep vein thrombosis Sister   . Diabetes Sister   . Hypertension Sister   . Hypertension Daughter   . Colon cancer Neg Hx      Current Outpatient Medications:  .  acetaminophen (TYLENOL) 325 MG tablet, Take 325-650 mg by mouth every 8 (eight) hours as needed for mild pain. , Disp: , Rfl:  .  allopurinol (ZYLOPRIM) 100 MG tablet, Take 100 mg by mouth daily.  , Disp: , Rfl:  .  Amino Acids-Protein Hydrolys (FEEDING SUPPLEMENT, PRO-STAT SUGAR FREE 64,) LIQD, Take 30 mLs by mouth 2 (two) times daily., Disp: 887 mL, Rfl: 0 .  aspirin EC 325 MG tablet, Take 1 tablet (325 mg total) by mouth daily., Disp: 30 tablet, Rfl: 0 .  atenolol (TENORMIN) 100 MG tablet, Take 100 mg by mouth daily. , Disp: , Rfl:  .  atorvastatin (LIPITOR) 10 MG tablet, Take 10 mg by mouth at bedtime. , Disp: , Rfl: 0 .  B Complex-Folic Acid (B COMPLEX FORMULA 1) TABS, Take 1 tablet by mouth daily. , Disp: ,  Rfl:  .  calcitRIOL (ROCALTROL) 0.5 MCG capsule, Take 2 capsules (1 mcg total) by mouth every Monday, Wednesday, and Friday with hemodialysis., Disp: 30 capsule, Rfl: 0 .  cinacalcet (SENSIPAR) 30 MG tablet, Take 1 tablet (30 mg total) by mouth daily with supper., Disp: 60 tablet, Rfl: 0 .  colchicine 0.6 MG tablet, Take 0.6 mg by mouth daily as needed (as directed for gout flares or pain). , Disp: , Rfl:  .  Darbepoetin Alfa (ARANESP) 60 MCG/0.3ML SOSY injection, Inject 0.3 mLs (60 mcg total) into the vein every Friday with hemodialysis., Disp: 4.2 mL, Rfl:  .  ipratropium-albuterol (DUONEB) 0.5-2.5 (3) MG/3ML SOLN, Take 3 mLs by nebulization every 6 (six) hours as needed., Disp: 120 mL, Rfl: 5 .  loperamide (IMODIUM) 2 MG capsule, Take 2 mg by mouth daily as needed for diarrhea or loose  stools. , Disp: , Rfl:  .  midodrine (PROAMATINE) 10 MG tablet, Take 1 tablet (10 mg total) by mouth 2 (two) times daily with a meal., Disp: 60 tablet, Rfl: 0 .  multivitamin (RENA-VIT) TABS tablet, Take 1 tablet by mouth at bedtime., Disp: 30 tablet, Rfl: 0 .  nicotine (NICODERM CQ - DOSED IN MG/24 HOURS) 14 mg/24hr patch, Place 1 patch (14 mg total) onto the skin daily as needed (smoking craving)., Disp: 28 patch, Rfl: 0 .  thiamine (VITAMIN B-1) 100 MG tablet, Take 100 mg by mouth daily., Disp: , Rfl:  .  VELPHORO 500 MG chewable tablet, Chew 1,000 mg by mouth 3 (three) times daily with meals. , Disp: , Rfl:  .  Vitamin D, Cholecalciferol, 25 MCG (1000 UT) TABS, Take 1,000 Units by mouth daily. , Disp: , Rfl:  .  hydrocerin (EUCERIN) CREA, Apply 1 application topically 2 (two) times daily. (Patient not taking: Reported on 01/20/2019), Disp: 228 g, Rfl: 0   No Known Allergies   Review of Systems  Respiratory: Negative.   Cardiovascular: Negative for chest pain, palpitations and leg swelling.     Today's Vitals   01/20/19 1545  BP: 118/70  Pulse: 63  Temp: 98.1 F (36.7 C)  TempSrc: Oral  SpO2: (!) 79%  Weight: 153 lb 3.2 oz (69.5 kg)  Height: 5' 8.8" (1.748 m)  PainSc: 0-No pain   Body mass index is 22.76 kg/m.   Objective:  Physical Exam Constitutional:      General: She is not in acute distress.    Appearance: Normal appearance.  HENT:     Nose: Congestion present.  Eyes:     Pupils: Pupils are equal, round, and reactive to light.  Cardiovascular:     Rate and Rhythm: Normal rate and regular rhythm.     Pulses: Normal pulses.     Heart sounds: Normal heart sounds. No murmur.  Pulmonary:     Effort: Pulmonary effort is normal. No respiratory distress.     Breath sounds: Normal breath sounds.  Skin:    General: Skin is warm and dry.     Capillary Refill: Capillary refill takes less than 2 seconds.  Neurological:     General: No focal deficit present.     Mental  Status: She is alert and oriented to person, place, and time.         Assessment And Plan:  1. Adenocarcinoma of right lung, stage 3 (HCC) Newly diagnosed and is awaiting a plan from Dr. Earlie Server  2. Essential hypertension Chronic, fair control Continue with current medications  3. Epistaxis  Reports intermittent  nose bleed couple days in row, no issues now but will refer to ENT for further evaluation - Ambulatory referral to ENT  4. Blurred vision, bilateral  Will refer to opthalmology  5. Cloudy vision  Concerning for glaucoma or cataracts will refer to eye doctor - Ambulatory referral to Ophthalmology  6. Cough  Productive cough, will check for covid and chest xray, this could be related to newly diagnosis of lung cancer. - DG Chest 2 View; Future - Novel Coronavirus, NAA ([facility])  7. Shortness of breath  She continues to have intermittent shortness of breath.  Walk test done with RA pulse ox 88%, then walking pulse ox 87%, then with oxygen at 2 l/m up to 91%  She would benefit from supplemental oxygen therapy at 2 l/m      [name], FNP    THE PATIENT IS ENCOURAGED TO PRACTICE SOCIAL DISTANCING DUE TO THE COVID-19 PANDEMIC.

## 2019-01-21 ENCOUNTER — Other Ambulatory Visit: Payer: Self-pay

## 2019-01-21 ENCOUNTER — Encounter: Payer: Self-pay | Admitting: Physician Assistant

## 2019-01-21 ENCOUNTER — Inpatient Hospital Stay: Payer: Medicare Other

## 2019-01-21 ENCOUNTER — Other Ambulatory Visit: Payer: Self-pay | Admitting: Internal Medicine

## 2019-01-21 ENCOUNTER — Ambulatory Visit: Payer: Medicare Other

## 2019-01-21 ENCOUNTER — Inpatient Hospital Stay (HOSPITAL_BASED_OUTPATIENT_CLINIC_OR_DEPARTMENT_OTHER): Payer: Medicare Other | Admitting: Physician Assistant

## 2019-01-21 VITALS — BP 126/67 | HR 68 | Temp 98.3°F | Resp 18 | Ht 68.8 in | Wt 154.9 lb

## 2019-01-21 DIAGNOSIS — C3491 Malignant neoplasm of unspecified part of right bronchus or lung: Secondary | ICD-10-CM

## 2019-01-21 DIAGNOSIS — C3411 Malignant neoplasm of upper lobe, right bronchus or lung: Secondary | ICD-10-CM | POA: Diagnosis not present

## 2019-01-21 DIAGNOSIS — Z7189 Other specified counseling: Secondary | ICD-10-CM | POA: Diagnosis not present

## 2019-01-21 DIAGNOSIS — C3490 Malignant neoplasm of unspecified part of unspecified bronchus or lung: Secondary | ICD-10-CM

## 2019-01-21 DIAGNOSIS — N186 End stage renal disease: Secondary | ICD-10-CM

## 2019-01-21 LAB — NOVEL CORONAVIRUS, NAA: SARS-CoV-2, NAA: NOT DETECTED

## 2019-01-21 LAB — CMP (CANCER CENTER ONLY)
ALT: 9 U/L (ref 0–44)
AST: 17 U/L (ref 15–41)
Albumin: 2.7 g/dL — ABNORMAL LOW (ref 3.5–5.0)
Alkaline Phosphatase: 169 U/L — ABNORMAL HIGH (ref 38–126)
Anion gap: 14 (ref 5–15)
BUN: 43 mg/dL — ABNORMAL HIGH (ref 8–23)
CO2: 24 mmol/L (ref 22–32)
Calcium: 8.6 mg/dL — ABNORMAL LOW (ref 8.9–10.3)
Chloride: 99 mmol/L (ref 98–111)
Creatinine: 6.12 mg/dL (ref 0.44–1.00)
GFR, Est AFR Am: 7 mL/min — ABNORMAL LOW (ref >60)
GFR, Estimated: 6 mL/min — ABNORMAL LOW (ref >60)
Glucose, Bld: 72 mg/dL (ref 70–99)
Potassium: 3.5 mmol/L (ref 3.5–5.1)
Sodium: 137 mmol/L (ref 135–145)
Total Bilirubin: 0.6 mg/dL (ref 0.3–1.2)
Total Protein: 7.4 g/dL (ref 6.5–8.1)

## 2019-01-21 LAB — CBC WITH DIFFERENTIAL (CANCER CENTER ONLY)
Abs Immature Granulocytes: 0.01 10*3/uL (ref 0.00–0.07)
Basophils Absolute: 0 10*3/uL (ref 0.0–0.1)
Basophils Relative: 1 %
Eosinophils Absolute: 0.1 10*3/uL (ref 0.0–0.5)
Eosinophils Relative: 1 %
HCT: 31.6 % — ABNORMAL LOW (ref 36.0–46.0)
Hemoglobin: 10 g/dL — ABNORMAL LOW (ref 12.0–15.0)
Immature Granulocytes: 0 %
Lymphocytes Relative: 23 %
Lymphs Abs: 1.1 10*3/uL (ref 0.7–4.0)
MCH: 31 pg (ref 26.0–34.0)
MCHC: 31.6 g/dL (ref 30.0–36.0)
MCV: 97.8 fL (ref 80.0–100.0)
Monocytes Absolute: 0.5 10*3/uL (ref 0.1–1.0)
Monocytes Relative: 10 %
Neutro Abs: 3.1 10*3/uL (ref 1.7–7.7)
Neutrophils Relative %: 65 %
Platelet Count: 134 10*3/uL — ABNORMAL LOW (ref 150–400)
RBC: 3.23 MIL/uL — ABNORMAL LOW (ref 3.87–5.11)
RDW: 16.6 % — ABNORMAL HIGH (ref 11.5–15.5)
WBC Count: 4.8 10*3/uL (ref 4.0–10.5)
nRBC: 0 % (ref 0.0–0.2)

## 2019-01-21 MED ORDER — PROCHLORPERAZINE MALEATE 10 MG PO TABS: 10.0000 mg | ORAL_TABLET | Freq: Four times a day (QID) | ORAL | 1 refills | Status: DC | PRN

## 2019-01-21 NOTE — Progress Notes (Signed)
[facility] OFFICE PROGRESS NOTE  [name], [address] [place] [address]  DIAGNOSIS: Stage IIIa non-small cell lung cancer, adenocarcinoma presented with right upper lobe lung mass in addition to right hilar and mediastinal lymphadenopathy. Diagnosed in November 2020.   PRIOR THERAPY: None  CURRENT THERAPY: Weekly concurrent chemoradiation with carboplatin for an AUC of 2 and Paclitaxel 45 mg/m2. First dose expected on 02/04/2019.   INTERVAL HISTORY: Jessica Clarke 68 y.o. female returns to the clinic for a follow up visit accompanied by her daughter. The patient is feeling fairly well today without any concerning complaints except mild diarrhea for which she takes imodium. She is currently on dialysis on Monday, Wednesday, and Friday. She denies any recent fever, chills, night sweats, or weight loss. She denies any chest pain, shortness of breath, cough, or hemoptysis. She denies any nausea, vomiting, diarrhea, or constipation. She denies any headaches or visual changes. She was recently seen by Dr. Valeta Harms and underwent video bronchoscopy with endobronchial ultrasound on December 19, 2018.  The final pathology ([id number] ) showed metastatic adenocarcinoma.The neoplastic cells are positive for TTF-1 but negative for cytokeratin 5/6 and p40. The patient unfortunately continues to smoke cigarettes. Overall, the features are consistent with a clinical impression of metastatic lung adenocarcinoma.  The tissue block was sent to [facility] for molecular studies. The patient recently had a PET scan performed to complete the staging workup. She is here today for evaluation and to discuss treatment options.   MEDICAL HISTORY: Past Medical History:  Diagnosis Date  . Abnormal Pap smear    ASCUS ?HGSIL  . Anemia   . Arthritis   . Cancer ([place])    lung ( per daughter)  . Cervical dysplasia   . CHF (congestive heart failure) ([place])   . Chronic kidney disease    . Constipation   . Constipation   . Dyspnea   . GERD (gastroesophageal reflux disease)   . Gout   . Hyperlipidemia   . Hypertension   . Myocardial infarction ([place]) 2015  . Obesity   . Pneumonia   . Uterine polyp     ALLERGIES:  has No Known Allergies.  MEDICATIONS:  Current Outpatient Medications  Medication Sig Dispense Refill  . acetaminophen (TYLENOL) 325 MG tablet Take 325-650 mg by mouth every 8 (eight) hours as needed for mild pain.     Marland Kitchen allopurinol (ZYLOPRIM) 100 MG tablet Take 100 mg by mouth daily.      . Amino Acids-Protein Hydrolys (FEEDING SUPPLEMENT, PRO-STAT SUGAR FREE 64,) LIQD Take 30 mLs by mouth 2 (two) times daily. 887 mL 0  . atenolol (TENORMIN) 100 MG tablet Take 100 mg by mouth daily.     Marland Kitchen atorvastatin (LIPITOR) 10 MG tablet Take 10 mg by mouth at bedtime.   0  . B Complex-Folic Acid (B COMPLEX FORMULA 1) TABS Take 1 tablet by mouth daily.     . calcitRIOL (ROCALTROL) 0.5 MCG capsule Take 2 capsules (1 mcg total) by mouth every Monday, Wednesday, and Friday with hemodialysis. 30 capsule 0  . cinacalcet (SENSIPAR) 30 MG tablet Take 1 tablet (30 mg total) by mouth daily with supper. 60 tablet 0  . Darbepoetin Alfa (ARANESP) 60 MCG/0.3ML SOSY injection Inject 0.3 mLs (60 mcg total) into the vein every Friday with hemodialysis. 4.2 mL   . midodrine (PROAMATINE) 10 MG tablet Take 1 tablet (10 mg total) by mouth 2 (two) times daily with a meal. 60 tablet 0  .  multivitamin (RENA-VIT) TABS tablet Take 1 tablet by mouth at bedtime. 30 tablet 0  . thiamine (VITAMIN B-1) 100 MG tablet Take 100 mg by mouth daily.    . VELPHORO 500 MG chewable tablet Chew 1,000 mg by mouth 3 (three) times daily with meals.     . Vitamin D, Cholecalciferol, 25 MCG (1000 UT) TABS Take 1,000 Units by mouth daily.     Marland Kitchen aspirin EC 325 MG tablet Take 1 tablet (325 mg total) by mouth daily. (Patient not taking: Reported on 01/21/2019) 30 tablet 0  . colchicine 0.6 MG tablet Take 0.6 mg by mouth  daily as needed (as directed for gout flares or pain).     . hydrocerin (EUCERIN) CREA Apply 1 application topically 2 (two) times daily. (Patient not taking: Reported on 01/20/2019) 228 g 0  . ipratropium-albuterol (DUONEB) 0.5-2.5 (3) MG/3ML SOLN Take 3 mLs by nebulization every 6 (six) hours as needed. (Patient not taking: Reported on 01/21/2019) 120 mL 5  . loperamide (IMODIUM) 2 MG capsule Take 2 mg by mouth daily as needed for diarrhea or loose stools.     . nicotine (NICODERM CQ - DOSED IN MG/24 HOURS) 14 mg/24hr patch Place 1 patch (14 mg total) onto the skin daily as needed (smoking craving). (Patient not taking: Reported on 01/21/2019) 28 patch 0  . prochlorperazine (COMPAZINE) 10 MG tablet Take 1 tablet (10 mg total) by mouth every 6 (six) hours as needed for nausea or vomiting. 30 tablet 1   No current facility-administered medications for this visit.     SURGICAL HISTORY:  Past Surgical History:  Procedure Laterality Date  . AV FISTULA PLACEMENT  01/18/2012   Procedure: ARTERIOVENOUS (AV) FISTULA CREATION;  Surgeon: Angelia Mould, MD;  Location: [facility];  Service: Vascular;  Laterality: Left;  . BIOPSY  10/20/2018   Procedure: BIOPSY;  Surgeon: Thornton Park, MD;  Location: [facility];  Service: Gastroenterology;;  . COLONOSCOPY WITH PROPOFOL N/A 10/20/2018   Procedure: COLONOSCOPY WITH PROPOFOL;  Surgeon: Thornton Park, MD;  Location: [facility];  Service: Gastroenterology;  Laterality: N/A;  . DILATION AND CURETTAGE OF UTERUS    . HYSTEROSCOPY    . LEEP    . LEFT HEART CATHETERIZATION WITH CORONARY ANGIOGRAM N/A 08/26/2013   Procedure: LEFT HEART CATHETERIZATION WITH CORONARY ANGIOGRAM;  Surgeon: Leonie Man, MD;  Location[facility] CATH LAB;  Service: Cardiovascular;  Laterality: N/A;  . POLYPECTOMY  10/20/2018   Procedure: POLYPECTOMY;  Surgeon: Thornton Park, MD;  Location: [facility] ENDOSCOPY;  Service: Gastroenterology;;  . REVISON OF ARTERIOVENOUS FISTULA Left  04/28/4006   Procedure: PLICATION OF ARTERIOVENOUS FISTULA;  Surgeon: Conrad North Weeki [place]e, MD;  Location: [facility];  Service: Vascular;  Laterality: Left;  Marland Kitchen VIDEO BRONCHOSCOPY WITH ENDOBRONCHIAL ULTRASOUND N/A 12/19/2018   Procedure: VIDEO BRONCHOSCOPY WITH ENDOBRONCHIAL ULTRASOUND;  Surgeon: Garner Nash, DO;  Location: [facility] OR;  Service: Thoracic;  Laterality: N/A;    REVIEW OF SYSTEMS:   Review of Systems  Constitutional: Positive for fatigue. Negative for appetite change, chills, fever and unexpected weight change.  HENT: Negative for mouth sores, nosebleeds, sore throat and trouble swallowing.   Eyes: Negative for eye problems and icterus.  Respiratory: Negative for cough, hemoptysis, shortness of breath and wheezing.   Cardiovascular: Negative for chest pain and leg swelling.  Gastrointestinal: Positive for mild diarrhea. Negative for abdominal pain, constipation, nausea and vomiting.  Genitourinary: Negative for bladder incontinence, difficulty urinating, dysuria, frequency and hematuria.   Musculoskeletal: Negative for back  pain, gait problem, neck pain and neck stiffness.  Skin: Negative for itching and rash.  Neurological: Negative for dizziness, extremity weakness, gait problem, headaches, light-headedness and seizures.  Hematological: Negative for adenopathy. Does not bruise/bleed easily.  Psychiatric/Behavioral: Negative for confusion, depression and sleep disturbance. The patient is not nervous/anxious.     PHYSICAL EXAMINATION:  Blood pressure 126/67, pulse 68, temperature 98.3 F (36.8 C), temperature source Oral, resp. rate 18, height 5' 8.8" (1.748 m), weight 154 lb 14.4 oz (70.3 kg), SpO2 95 %.  ECOG PERFORMANCE STATUS: 1 - Symptomatic but completely ambulatory  Physical Exam  Constitutional: Oriented to person, place, and time and chronically ill appears and in no distress.   HENT:  Head: Normocephalic and atraumatic.  Mouth/Throat: Oropharynx is clear and moist. No  oropharyngeal exudate.  Eyes: Conjunctivae are normal. Right eye exhibits no discharge. Left eye exhibits no discharge. No scleral icterus. Arcus senilis noted bilaterally. Neck: Normal range of motion. Neck supple.  Cardiovascular: Normal rate, regular rhythm, normal heart sounds and intact distal pulses.   Pulmonary/Chest: Effort normal. No respiratory distress. No wheezes.Crackles noted bilaterally at the base of the lungs  Abdominal: Soft. Bowel sounds are normal. Exhibits no distension and no mass. There is no tenderness.  Musculoskeletal: Normal range of motion. Exhibits no edema.  Lymphadenopathy:    No cervical adenopathy.  Neurological: Alert and oriented to person, place, and time. Exhibits normal muscle tone. Gait normal. Coordination normal.  Skin: Skin is warm and dry. No rash noted. Not diaphoretic. No erythema. No pallor.  Psychiatric: Mood, memory and judgment normal.  Vitals reviewed.  LABORATORY DATA: Lab Results  Component Value Date   WBC 4.8 01/21/2019   HGB 10.0 (L) 01/21/2019   HCT 31.6 (L) 01/21/2019   MCV 97.8 01/21/2019   PLT 134 (L) 01/21/2019      Chemistry      Component Value Date/Time   NA 137 01/21/2019 1122   NA 136 11/19/2018 1640   K 3.5 01/21/2019 1122   CL 99 01/21/2019 1122   CO2 24 01/21/2019 1122   BUN 43 (H) 01/21/2019 1122   BUN 19 11/19/2018 1640   CREATININE 6.12 (HH) 01/21/2019 1122      Component Value Date/Time   CALCIUM 8.6 (L) 01/21/2019 1122   CALCIUM 9.0 10/22/2009 2253   ALKPHOS 169 (H) 01/21/2019 1122   AST 17 01/21/2019 1122   ALT 9 01/21/2019 1122   BILITOT 0.6 01/21/2019 1122       RADIOGRAPHIC STUDIES:  Nm Pet Image Restag (ps) Skull Base To Thigh  Result Date: 01/16/2019 CLINICAL DATA:  Subsequent treatment strategy for non-small cell lung cancer. EXAM: NUCLEAR MEDICINE PET SKULL BASE TO THIGH TECHNIQUE: 8.8 mCi F-18 FDG was injected intravenously. Full-ring PET imaging was performed from the skull base to  thigh after the radiotracer. CT data was obtained and used for attenuation correction and anatomic localization. Fasting blood glucose: 76 mg/dl COMPARISON:  CT chest 11/12/2018 and PET-CT from 02/16/2018 FINDINGS: Mediastinal blood pool activity: SUV max 1.8 Liver activity: SUV max 2.5 NECK: No significant abnormal hypermetabolic activity in this region. Incidental CT findings: None CHEST: Right paratracheal node 2.1 cm in short axis on image 58/4 with maximum SUV 11.7, previous measurements 1.0 cm on 02/26/2018 and 1.9 cm on 11/12/2018 with previous maximum SUV 5.6. Right hilar lymph node 2.0 cm in short axis on image 67/4, previously 1.8 cm on 11/12/2018 and previously 1.5 cm on 02/26/2018. Current maximum SUV 12.2, previously 4.4 on  02/26/2018. Right upper lobe pulmonary nodule with spiculated margins measuring 2.3 by 1.6 cm on image 16/8, previously cavitary and measuring 2.5 by 1.9 cm on 11/12/2018 and 2.0 by 1.6 cm on 02/26/2018. Current maximum SUV 6.0, previous maximum SUV 9.9. Faintly accentuated activity along AP window lymph nodes, difficult to measure due to indistinct margins but up to about 1.1 cm in thickness on image 63/4, maximum SUV 4.4, previously 3.5. Incidental CT findings: Coronary, aortic arch, and branch vessel atherosclerotic vascular disease. Moderate cardiomegaly. Mitral valve calcification. Small right pleural effusion similar to 11/12/2018. Paraseptal emphysema. Mild interstitial accentuation in both lungs. ABDOMEN/PELVIS: Sigmoid diverticulosis with local likely physiologic activity along the sigmoid colon with maximum SUV 7.4, previously 4.6 in this region. Incidental CT findings: Ascites with diffuse third spacing of fluid in the subcutaneous tissues and mesentery. Severe bilateral renal atrophy. Aortoiliac atherosclerotic vascular disease. Sigmoid colon diverticulosis. Low-grade accentuated metabolic activity associated with the diffuse third spacing of fluid. SKELETON: Faintly  accentuated activity along the left facet joint at about the C3-4 level, probably from facet arthropathy, the maximum SUV is only 4.2. No definite worrisome activity identified. Incidental CT findings: Cervical and lumbar spondylosis. IMPRESSION: 1. Although the primary right upper lobe lesion is smaller than it was on 11/12/2018 at current measurement of 2.3 by 1.6 cm, the lesion is hypermetabolic with maximum SUV of 6.0 compatible with malignancy. Prior SUV was 9.9. 2. In contrast to the reduction in activity in the nodule, there is significant increased size and activity in the right paratracheal and right hilar adenopathy compatible with progressive malignancy. 3. Continued low-grade activity along borderline enlarged AP window lymph nodes. Current maximum SUV is 4.4, previously 3.5. Contralateral mediastinal nodal metastatic disease is not excluded given this appearance. 4. No persuasive evidence of metastatic disease to the neck, abdomen/pelvis, or skeleton. 5. Other imaging findings of potential clinical significance: Aortic Atherosclerosis (ICD10-I70.0). Coronary atherosclerosis. Mild cardiomegaly. Mitral valve calcification. Stable small right pleural effusion. Paraseptal emphysema. Chronic interstitial accentuation in the lungs. Sigmoid diverticulosis. Ascites. Third spacing of fluid in the subcutaneous tissues and mesentery. Severe bilateral renal atrophy. Sigmoid colon diverticulosis. Electronically Signed   By: Van Clines M.D.   On: 01/16/2019 17:19     ASSESSMENT/PLAN:  Stage IIIa non-small cell lung cancer, adenocarcinoma presented with right upper lobe lung mass in addition to right hilar and mediastinal lymphadenopathy. Diagnosed in November 2020.   The patient recently had an initial staging PET scan performed. Dr. Julien Nordmann personally and independently reviewed the scan and discussed the results with the patient. The scan showed primary right upper lobe lesion as well as right  paratracheal as well as hilar lymphadenopathy.  Dr. Julien Nordmann had a lengthy discussion with the patient and her daughter about her current condition and treatment options. Dr. Julien Nordmann recommends that the patient undergo weekly concurrent chemoradiation with carboplatin for an AUC of 2 and Paclitaxel 45 mg/m2 for about 6-7 weeks. The patient is interested in this option and is expected to start on 02/04/2019. The patient will receive her chemotherapy on Tuesday's due to her dialysis being M, W, and F.   We discussed with the patient the adverse effect of this treatment including but not limited to alopecia, myelosuppression, nausea and vomiting, peripheral neuropathy, liver or renal dysfunction.  We will see the patient back for a follow up visit in 2 weeks with the cycle of #1.   I will arrange for the patient to have a chemoradiation class prior to receiving his first  cycle of treatment.   I have sent a prescription for compazine 10 mg p.o. every 6 hours to her pharmacy.   I have placed a referral to radiation oncology for consultation to discuss weekly radiation.   I have placed a referral to social work to assess the patient's needs/challenges at home.   The patient was advised to call immediately if she has any concerning symptoms in the interval. The patient voices understanding of current disease status and treatment options and is in agreement with the current care plan. All questions were answered. The patient knows to call the clinic with any problems, questions or concerns. We can certainly see the patient much sooner if necessary   Orders Placed This Encounter  Procedures  . CBC with Differential (Cancer Center Only)    Standing Status:   Standing    Number of Occurrences:   6    Standing Expiration Date:   01/21/2020  . CMP ([facility] only)    Standing Status:   Standing    Number of Occurrences:   6    Standing Expiration Date:   01/21/2020  . Ambulatory referral to  Radiation Oncology    Referral Priority:   Routine    Referral Type:   Consultation    Referral Reason:   Specialty Services Required    Requested Specialty:   Radiation Oncology    Number of Visits Requested:   [place], PA-C 01/21/19  ADDENDUM: Hematology/Oncology Attending: I had a face-to-face encounter with the patient today.  I recommended her care plan.  This is a very pleasant 68 years old African-American female recently diagnosed with stage IIIa (T1b, N2, M0) non-small cell lung cancer, adenocarcinoma presented with right upper lobe lung nodule in addition to mediastinal lymphadenopathy.  The patient had a recent PET scan performed.  I personally and independently reviewed the scan images and discussed the result and showed the images to the patient and her daughter today. I discussed with the patient her treatment options and I recommended for her a course of concurrent chemoradiation with weekly carboplatin for AUC of 2 and paclitaxel 45 mg/M2. I discussed with the patient the adverse effect of this treatment including but not limited to alopecia, myelosuppression, nausea and vomiting, peripheral neuropathy, liver or renal dysfunction. The patient is currently on hemodialysis on Monday, Wednesday and Friday.  I will arrange for her to receive her weekly chemotherapy on Tuesday. I will refer the patient to radiation oncology for evaluation and discussion of the radiotherapy option. She is expected to start the first cycle of this treatment on February 03, 2019. The patient will come back for follow-up visit at that time. For the end-stage renal disease, she will continue her current hemodialysis under the care of her nephrologist by [facility]. She was advised to call immediately if she has any concerning symptoms in the interval.  Disclaimer: This note was dictated with voice recognition software. Similar sounding words can inadvertently be  transcribed and may be missed upon review. Eilleen Kempf, MD 01/21/19

## 2019-01-21 NOTE — Patient Instructions (Signed)
-  There are two main categories of lung cancer, they are named based on the size of the cancer cell. One is called Non-Small cell lung cancer. The other type is Small Cell Lung Cancer -The sample (biopsy) that they took of your tumor was consistent with a subtype of Non-small cell lung cancer called adenocarcinoma -We covered a lot of important information at your appointment today regarding what the treatment plan is moving forward. Here are the the main points that were discussed at your office visit with [place] today:  -The treatment that you will receive consists of two chemotherapy drugs called Carboplatin and Paclitaxel (also called Taxol) -We are planning on starting your treatment next week on 02/04/2019 but before your start your treatment, I would like you to attend a Chemotherapy Education Class. This involves having you sit down with one of our nurse educators. She will discuss with you one-on-one more details about your treatment as well as general information about resources here at the [facility] treatment will be given every week for about 6 weeks or so (as long as you are also receiving radiation). We will check your labs (blood work) once a week . Dr. Julien Nordmann or I will see you every other treatment just to make sure that you are doing well and that everything is on track. -We will get a CT scan about 3 weeks after you complete your treatment  Medications:  -Compazine was sent to your pharmacy. This medication is for nausea. You may take this every 6 hours as needed if you feel nausous.   Referrals -Radiation oncology-Please be on the lookout for a phone call from the radiation oncologist to schedule your initial visit with one of the doctors down there.   Follow Up:  -We will see you back for a follow up visit in 2 weeks with the first dose of chemotherapy. Then we will see you every other treatment there after.  -Our main office number is [phone], you will get the  operator, please ask to speak to Dr. Julien Nordmann or [place] nurse if you have questions or concerns about chemotherapy treatment

## 2019-01-21 NOTE — Progress Notes (Signed)
START ON PATHWAY REGIMEN - Non-Small Cell Lung     Administer weekly:     Paclitaxel      Carboplatin   **Always confirm dose/schedule in your pharmacy ordering system**  Patient Characteristics: Stage III - Unresectable, PS = 0, 1 AJCC T Category: T1c Current Disease Status: No Distant Mets or Local Recurrence AJCC N Category: N2 AJCC M Category: M0 AJCC 8 Stage Grouping: IIIA ECOG Performance Status: 1 Intent of Therapy: Curative Intent, Discussed with Patient

## 2019-01-22 NOTE — Chronic Care Management (AMB) (Signed)
Chronic Care Management    Social Work Follow Up Note  01/21/2019 Name: Jessica Clarke MRN: [id number] DOB: 17-Apr-1950  Jessica Clarke is a 68 y.o. year old female who is a primary care patient of [name], [place]. The CCM team was consulted for assistance with care coordination.   Review of patient status, including review of consultants reports, other relevant assessments, and collaboration with appropriate care team members and the patient's provider was performed as part of comprehensive patient evaluation and provision of chronic care management services.    SW placed an outbound call to the patient and her daughter to review care coordination needs.  Outpatient Encounter Medications as of 01/21/2019  Medication Sig Note  . acetaminophen (TYLENOL) 325 MG tablet Take 325-650 mg by mouth every 8 (eight) hours as needed for mild pain.    Marland Kitchen allopurinol (ZYLOPRIM) 100 MG tablet Take 100 mg by mouth daily.     . Amino Acids-Protein Hydrolys (FEEDING SUPPLEMENT, PRO-STAT SUGAR FREE 64,) LIQD Take 30 mLs by mouth 2 (two) times daily.   Marland Kitchen aspirin EC 325 MG tablet Take 1 tablet (325 mg total) by mouth daily. (Patient not taking: Reported on 01/21/2019) 12/19/2018: On hold until after procedure  . atenolol (TENORMIN) 100 MG tablet Take 100 mg by mouth daily.    Marland Kitchen atorvastatin (LIPITOR) 10 MG tablet Take 10 mg by mouth at bedtime.    . B Complex-Folic Acid (B COMPLEX FORMULA 1) TABS Take 1 tablet by mouth daily.    . calcitRIOL (ROCALTROL) 0.5 MCG capsule Take 2 capsules (1 mcg total) by mouth every Monday, Wednesday, and Friday with hemodialysis.   Marland Kitchen cinacalcet (SENSIPAR) 30 MG tablet Take 1 tablet (30 mg total) by mouth daily with supper.   . colchicine 0.6 MG tablet Take 0.6 mg by mouth daily as needed (as directed for gout flares or pain).    . Darbepoetin Alfa (ARANESP) 60 MCG/0.3ML SOSY injection Inject 0.3 mLs (60 mcg total) into the vein every Friday with hemodialysis.   . hydrocerin  (EUCERIN) CREA Apply 1 application topically 2 (two) times daily. (Patient not taking: Reported on 01/20/2019)   . ipratropium-albuterol (DUONEB) 0.5-2.5 (3) MG/3ML SOLN Take 3 mLs by nebulization every 6 (six) hours as needed. (Patient not taking: Reported on 01/21/2019)   . loperamide (IMODIUM) 2 MG capsule Take 2 mg by mouth daily as needed for diarrhea or loose stools.    . midodrine (PROAMATINE) 10 MG tablet Take 1 tablet (10 mg total) by mouth 2 (two) times daily with a meal.   . multivitamin (RENA-VIT) TABS tablet Take 1 tablet by mouth at bedtime.   . nicotine (NICODERM CQ - DOSED IN MG/24 HOURS) 14 mg/24hr patch Place 1 patch (14 mg total) onto the skin daily as needed (smoking craving). (Patient not taking: Reported on 01/21/2019)   . prochlorperazine (COMPAZINE) 10 MG tablet Take 1 tablet (10 mg total) by mouth every 6 (six) hours as needed for nausea or vomiting.   . thiamine (VITAMIN B-1) 100 MG tablet Take 100 mg by mouth daily.   . VELPHORO 500 MG chewable tablet Chew 1,000 mg by mouth 3 (three) times daily with meals.    . Vitamin D, Cholecalciferol, 25 MCG (1000 UT) TABS Take 1,000 Units by mouth daily.     No facility-administered encounter medications on file as of 01/21/2019.      Goals Addressed            This Visit's Progress   .  COMPLETED: "I would like my mom to have a CNA in her home"       Daughter stated:  Current Barriers:  . Limited knowledge of health plan benefits  Social Work Clinical Goal(s):  Marland Kitchen Over the next 20 days, the patient will work with SW to determined health plan benefits. . Over the next 45 days the patient and her daughter will become more knowledgeable of community resources available to assist with care needs  CCM SW Interventions: Completed 01/21/2019 . Outbound call to the patients daughter to assess progression of patient goal.  . Confirmed the patient has been receiving caregiver under [employer] benefit . Discussed the benefit of patient  caregiver in the home to assist with ADL needs . Reviewed current ADL barriers due to ESRD . Goal Met  CCM RN CM Interventions:  12/19/18 call completed with daughter [name] call received from daughter [name] to advise [name] has been approved for her own apartment; discussed [name] will require a form to be completed by PCP provider indicating the patient will require 24 hour supervised care Provided [name] with the fax # for [facility]; she will fax this form to [name], FNP attention Sent in basket message to embedded [place] with an update  Patient Self Care Activities:  . Self administers medications as prescribed . Attends all scheduled provider appointments . Calls provider office for new concerns or questions  Please see past updates related to this goal by clicking on the "Past Updates" button in the selected goal          Follow Up Plan: No SW follow up planned at this time. The patient will remain active with RN Case Manager.   Daneen Schick, BSW, CDP Social Worker, Certified Dementia Practitioner [place] [facility] [phone]  Total time spent performing care coordination and/or care management activities with the patient by phone or face to face = 10 minutes.

## 2019-01-22 NOTE — Patient Instructions (Signed)
Social Worker Visit Information  Goals we discussed today:  Goals Addressed            This Visit's Progress   . COMPLETED: "I would like my mom to have a CNA in her home"       Daughter stated:  Current Barriers:  . Limited knowledge of health plan benefits  Social Work Clinical Goal(s):  Marland Kitchen Over the next 20 days, the patient will work with SW to determined health plan benefits. . Over the next 45 days the patient and her daughter will become more knowledgeable of community resources available to assist with care needs  CCM SW Interventions: Completed 01/21/2019 . Outbound call to the patients daughter to assess progression of patient goal.  . Confirmed the patient has been receiving caregiver under [employer] benefit . Discussed the benefit of patient caregiver in the home to assist with ADL needs . Reviewed current ADL barriers due to ESRD . Goal Met  Patient Self Care Activities:  . Self administers medications as prescribed . Attends all scheduled provider appointments . Calls provider office for new concerns or questions  Please see past updates related to this goal by clicking on the "Past Updates" button in the selected goal          Follow Up Plan: No SW follow up planned at this time. Please contact me with future resource needs!  Daneen Schick, BSW, CDP Social Worker, Certified Dementia Practitioner [place] [facility] [phone]

## 2019-01-28 ENCOUNTER — Other Ambulatory Visit: Payer: Self-pay

## 2019-01-28 ENCOUNTER — Inpatient Hospital Stay: Payer: Medicare Other

## 2019-01-30 ENCOUNTER — Other Ambulatory Visit: Payer: Self-pay

## 2019-01-30 ENCOUNTER — Other Ambulatory Visit: Payer: Medicare Other | Admitting: *Deleted

## 2019-01-30 DIAGNOSIS — Z515 Encounter for palliative care: Secondary | ICD-10-CM

## 2019-01-30 NOTE — Progress Notes (Signed)
COMMUNITY PALLIATIVE CARE RN NOTE  PATIENT NAME: Jessica Clarke Needs DOB: 1950-05-10 MRN: [id number]  PRIMARY CARE PROVIDER: [name], FNP  RESPONSIBLE PARTY: [name] (daughter) Acct ID - Guarantor Home Phone Work Phone Relationship Acct Type  [id number] Wilhelmina [id number]  Self P/F     [address], [place], [place] [address]   Due to the COVID-19 crisis, this virtual check-in visit was done via telephone from my office and it was initiated and consent by this patient and or family.  PLAN OF CARE and INTERVENTION:  1. ADVANCE CARE PLANNING/GOALS OF CARE: Goal is for patient to eventually move back to her own home if possible.  2. PATIENT/CAREGIVER EDUCATION: Symptom Management 3. DISEASE STATUS: Virtual check-in visit completed via telephone. Spoke with both patient and her daughter, [name]. Patient denies pain at this time. She had a recent lung biopsy which showed Stage III Lung cancer in her right upper lobe. Treatment plan is chemotherapy for 6-8 weeks each week on Tuesdays, and radiation daily. Patient and daughter is unsure as to how many radiation treatments she will receive. She starts next week on 02/04/19. She continues on dialysis on Mon-Wed-Fri. She says that during treatment that she may have to stop or place therapies through [facility] on hold because she feels that she may not be able to participate having to be at the cancer center daily. She is ambulatory without assistive devices. She does hold onto walls, furniture, etc to steady herself. She reports dizziness with standing and having to be careful. No recent falls. She fatigues easily with activity. [name] says that [facility] is looking into getting a Rollator walker for patient. Patient says her appetite is ok, and she continues to drink her Glucerna for nutritional supplementation. She is continent of both bowel and bladder. She remains able to give herself a sponge bath. Daughter helps minimally with  dressing.They are working on completing a Living Will. Will continue to monitor.    HISTORY OF PRESENT ILLNESS:  This is a 68 yo female who currently resides with her daughter. Palliative care team continues to follow patient. Will continue with monthly and PRN visits.  CODE STATUS: Full Code  ADVANCED DIRECTIVES: N MOST FORM: no PPS: 50%   (Duration of visit and documentation 30 minutes)   Daryl Eastern, RN BSN

## 2019-02-03 ENCOUNTER — Telehealth: Payer: Self-pay

## 2019-02-03 NOTE — Progress Notes (Signed)
[facility] OFFICE PROGRESS NOTE  [name], [place] [address]  DIAGNOSIS: Stage IIIanon-small cell lung cancer, adenocarcinoma presented with right upper lobe lung mass in addition to right hilar and mediastinal lymphadenopathy. Diagnosed in November 2020.  PDL1: 1%  Molecular Biomarkers: No actionable mutations by foundation 1  PRIOR THERAPY: None  CURRENT THERAPY: Weekly concurrent chemoradiation with carboplatin for an AUC of 2 and Paclitaxel 45 mg/m2. First dose expected on 02/18/2019  INTERVAL HISTORY: Jessica Clarke 68 y.o. female returns to the clinic today for a follow-up visit.  The patient is feeling fairly well today without any concerning complaints except for a nose bleed a few days ago. She has an appointment with ENT next week on 02/11/2019. She is not on any blood thinners. She denies taking aspirin. The patient denies any fever, chills, or night sweats. She lost weight since her last appointment although she states that she is eating well.  She denies any chest pain,  cough, shortness of breath, or hemoptysis.  She denies any nausea, vomiting, or constipation. She still reports diarrhea. She has imodium at home but states that she was "taking it wrong". She denies any headache or visual changes. The patient has her initial consult visit with Dr. Sondra Come to from radiation oncology on 02/13/2019.  She is here today for evaluation before starting her first cycle of chemotherapy.   MEDICAL HISTORY: Past Medical History:  Diagnosis Date  . Abnormal Pap smear    ASCUS ?HGSIL  . Anemia   . Arthritis   . Cancer ([place])    lung ( per daughter)  . Cervical dysplasia   . CHF (congestive heart failure) ([place])   . Chronic kidney disease   . Constipation   . Constipation   . Dyspnea   . GERD (gastroesophageal reflux disease)   . Gout   . Hyperlipidemia   . Hypertension   . Myocardial infarction ([place]) 2015  . Obesity   .  Pneumonia   . Uterine polyp     ALLERGIES:  has No Known Allergies.  MEDICATIONS:  Current Outpatient Medications  Medication Sig Dispense Refill  . acetaminophen (TYLENOL) 325 MG tablet Take 325-650 mg by mouth every 8 (eight) hours as needed for mild pain.     Marland Kitchen allopurinol (ZYLOPRIM) 100 MG tablet Take 100 mg by mouth daily.      . Amino Acids-Protein Hydrolys (FEEDING SUPPLEMENT, PRO-STAT SUGAR FREE 64,) LIQD Take 30 mLs by mouth 2 (two) times daily. 887 mL 0  . aspirin EC 325 MG tablet Take 1 tablet (325 mg total) by mouth daily. (Patient not taking: Reported on 01/21/2019) 30 tablet 0  . atenolol (TENORMIN) 100 MG tablet Take 100 mg by mouth daily.     Marland Kitchen atorvastatin (LIPITOR) 10 MG tablet Take 10 mg by mouth at bedtime.   0  . B Complex-Folic Acid (B COMPLEX FORMULA 1) TABS Take 1 tablet by mouth daily.     . calcitRIOL (ROCALTROL) 0.5 MCG capsule Take 2 capsules (1 mcg total) by mouth every Monday, Wednesday, and Friday with hemodialysis. 30 capsule 0  . cinacalcet (SENSIPAR) 30 MG tablet Take 1 tablet (30 mg total) by mouth daily with supper. 60 tablet 0  . colchicine 0.6 MG tablet Take 0.6 mg by mouth daily as needed (as directed for gout flares or pain).     . Darbepoetin Alfa (ARANESP) 60 MCG/0.3ML SOSY injection Inject 0.3 mLs (60 mcg total) into the  vein every Friday with hemodialysis. 4.2 mL   . hydrocerin (EUCERIN) CREA Apply 1 application topically 2 (two) times daily. (Patient not taking: Reported on 01/20/2019) 228 g 0  . ipratropium-albuterol (DUONEB) 0.5-2.5 (3) MG/3ML SOLN Take 3 mLs by nebulization every 6 (six) hours as needed. (Patient not taking: Reported on 01/21/2019) 120 mL 5  . loperamide (IMODIUM) 2 MG capsule Take 2 mg by mouth daily as needed for diarrhea or loose stools.     . midodrine (PROAMATINE) 10 MG tablet Take 1 tablet (10 mg total) by mouth 2 (two) times daily with a meal. 60 tablet 0  . multivitamin (RENA-VIT) TABS tablet Take 1 tablet by mouth at  bedtime. 30 tablet 0  . nicotine (NICODERM CQ - DOSED IN MG/24 HOURS) 14 mg/24hr patch Place 1 patch (14 mg total) onto the skin daily as needed (smoking craving). (Patient not taking: Reported on 01/21/2019) 28 patch 0  . prochlorperazine (COMPAZINE) 10 MG tablet Take 1 tablet (10 mg total) by mouth every 6 (six) hours as needed for nausea or vomiting. 30 tablet 1  . thiamine (VITAMIN B-1) 100 MG tablet Take 100 mg by mouth daily.    . VELPHORO 500 MG chewable tablet Chew 1,000 mg by mouth 3 (three) times daily with meals.     . Vitamin D, Cholecalciferol, 25 MCG (1000 UT) TABS Take 1,000 Units by mouth daily.      No current facility-administered medications for this visit.    SURGICAL HISTORY:  Past Surgical History:  Procedure Laterality Date  . AV FISTULA PLACEMENT  01/18/2012   Procedure: ARTERIOVENOUS (AV) FISTULA CREATION;  Surgeon: Angelia Mould, MD;  Location: [place];  Service: Vascular;  Laterality: Left;  . BIOPSY  10/20/2018   Procedure: BIOPSY;  Surgeon: Thornton Park, MD;  Location: No Name;  Service: Gastroenterology;;  . COLONOSCOPY WITH PROPOFOL N/A 10/20/2018   Procedure: COLONOSCOPY WITH PROPOFOL;  Surgeon: Thornton Park, MD;  Location: [place];  Service: Gastroenterology;  Laterality: N/A;  . DILATION AND CURETTAGE OF UTERUS    . HYSTEROSCOPY    . LEEP    . LEFT HEART CATHETERIZATION WITH CORONARY ANGIOGRAM N/A 08/26/2013   Procedure: LEFT HEART CATHETERIZATION WITH CORONARY ANGIOGRAM;  Surgeon: Leonie Man, MD;  Location: [facility] CATH LAB;  Service: Cardiovascular;  Laterality: N/A;  . POLYPECTOMY  10/20/2018   Procedure: POLYPECTOMY;  Surgeon: Thornton Park, MD;  Location: [facility] ENDOSCOPY;  Service: Gastroenterology;;  . REVISON OF ARTERIOVENOUS FISTULA Left 1/61/0960   Procedure: PLICATION OF ARTERIOVENOUS FISTULA;  Surgeon: Conrad Edgerton, MD;  Location: [place];  Service: Vascular;  Laterality: Left;  Marland Kitchen VIDEO BRONCHOSCOPY WITH ENDOBRONCHIAL ULTRASOUND  N/A 12/19/2018   Procedure: VIDEO BRONCHOSCOPY WITH ENDOBRONCHIAL ULTRASOUND;  Surgeon: Garner Nash, DO;  Location: [facility] OR;  Service: Thoracic;  Laterality: N/A;    REVIEW OF SYSTEMS:   Review of Systems  Constitutional: Positive for fatigue. Negative for appetite change, chills, fever and unexpected weight change.  HENT: Positive for nose bleed. Negative for mouth sores, sore throat and trouble swallowing.   Eyes: Negative for eye problems and icterus.  Respiratory: Negative for cough, hemoptysis, shortness of breath and wheezing.   Cardiovascular: Negative for chest pain and leg swelling.  Gastrointestinal: Positive for mild diarrhea. Negative for abdominal pain, constipation, nausea and vomiting.  Genitourinary: Negative for bladder incontinence, difficulty urinating, dysuria, frequency and hematuria.   Musculoskeletal: Negative for back pain, gait problem, neck pain and neck stiffness.  Skin: Negative for  itching and rash.  Neurological: Negative for dizziness, extremity weakness, gait problem, headaches, light-headedness and seizures.  Hematological: Negative for adenopathy. Does not bruise/bleed easily.  Psychiatric/Behavioral: Negative for confusion, depression and sleep disturbance. The patient is not nervous/anxious.      PHYSICAL EXAMINATION:  Blood pressure 115/69, pulse 70, temperature 98.3 F (36.8 C), temperature source Temporal, resp. rate 17, height 5' 8.8" (1.748 m), weight 143 lb 14.4 oz (65.3 kg), SpO2 96 %.  ECOG PERFORMANCE STATUS: 1 - Symptomatic but completely ambulatory  Physical Exam  Constitutional: Oriented to person, place, and time and chronically ill appears and in no distress.  HENT:  Head: Normocephalic and atraumatic.  Mouth/Throat: Oropharynx is clear and moist. No oropharyngeal exudate.  Eyes: Conjunctivae are normal. Right eye exhibits no discharge. Left eye exhibits no discharge. No scleral icterus.  Neck: Normal range of motion. Neck supple.   Cardiovascular: Normal rate, regular rhythm, normal heart sounds and intact distal pulses.   Pulmonary/Chest: Effort normal. Crackles noted at the lung bases bilaterally. No respiratory distress. No wheezes.   Abdominal: Soft. Bowel sounds are normal. Exhibits no distension and no mass. There is no tenderness.  Musculoskeletal: Normal range of motion. Exhibits no edema.  Lymphadenopathy:    No cervical adenopathy.  Neurological: Alert and oriented to person, place, and time. Exhibits normal muscle tone. Gait normal. Coordination normal.  Skin: Skin is warm and dry. No rash noted. Not diaphoretic. No erythema. No pallor.  Psychiatric: Mood, memory and judgment normal.  Vitals reviewed.  LABORATORY DATA: Lab Results  Component Value Date   WBC 4.7 02/04/2019   HGB 9.0 (L) 02/04/2019   HCT 28.4 (L) 02/04/2019   MCV 96.3 02/04/2019   PLT 137 (L) 02/04/2019      Chemistry      Component Value Date/Time   NA 140 02/04/2019 1029   NA 136 11/19/2018 1640   K 4.1 02/04/2019 1029   CL 100 02/04/2019 1029   CO2 26 02/04/2019 1029   BUN 29 (H) 02/04/2019 1029   BUN 19 11/19/2018 1640   CREATININE 4.81 (HH) 02/04/2019 1029      Component Value Date/Time   CALCIUM 8.8 (L) 02/04/2019 1029   CALCIUM 9.0 10/22/2009 2253   ALKPHOS 167 (H) 02/04/2019 1029   AST 15 02/04/2019 1029   ALT 8 02/04/2019 1029   BILITOT 0.8 02/04/2019 1029       RADIOGRAPHIC STUDIES:  NM PET Image Restag (PS) Skull Base To Thigh  Result Date: 01/16/2019 CLINICAL DATA:  Subsequent treatment strategy for non-small cell lung cancer. EXAM: NUCLEAR MEDICINE PET SKULL BASE TO THIGH TECHNIQUE: 8.8 mCi F-18 FDG was injected intravenously. Full-ring PET imaging was performed from the skull base to thigh after the radiotracer. CT data was obtained and used for attenuation correction and anatomic localization. Fasting blood glucose: 76 mg/dl COMPARISON:  CT chest 11/12/2018 and PET-CT from 02/16/2018 FINDINGS:  Mediastinal blood pool activity: SUV max 1.8 Liver activity: SUV max 2.5 NECK: No significant abnormal hypermetabolic activity in this region. Incidental CT findings: None CHEST: Right paratracheal node 2.1 cm in short axis on image 58/4 with maximum SUV 11.7, previous measurements 1.0 cm on 02/26/2018 and 1.9 cm on 11/12/2018 with previous maximum SUV 5.6. Right hilar lymph node 2.0 cm in short axis on image 67/4, previously 1.8 cm on 11/12/2018 and previously 1.5 cm on 02/26/2018. Current maximum SUV 12.2, previously 4.4 on 02/26/2018. Right upper lobe pulmonary nodule with spiculated margins measuring 2.3 by 1.6 cm on  image 16/8, previously cavitary and measuring 2.5 by 1.9 cm on 11/12/2018 and 2.0 by 1.6 cm on 02/26/2018. Current maximum SUV 6.0, previous maximum SUV 9.9. Faintly accentuated activity along AP window lymph nodes, difficult to measure due to indistinct margins but up to about 1.1 cm in thickness on image 63/4, maximum SUV 4.4, previously 3.5. Incidental CT findings: Coronary, aortic arch, and branch vessel atherosclerotic vascular disease. Moderate cardiomegaly. Mitral valve calcification. Small right pleural effusion similar to 11/12/2018. Paraseptal emphysema. Mild interstitial accentuation in both lungs. ABDOMEN/PELVIS: Sigmoid diverticulosis with local likely physiologic activity along the sigmoid colon with maximum SUV 7.4, previously 4.6 in this region. Incidental CT findings: Ascites with diffuse third spacing of fluid in the subcutaneous tissues and mesentery. Severe bilateral renal atrophy. Aortoiliac atherosclerotic vascular disease. Sigmoid colon diverticulosis. Low-grade accentuated metabolic activity associated with the diffuse third spacing of fluid. SKELETON: Faintly accentuated activity along the left facet joint at about the C3-4 level, probably from facet arthropathy, the maximum SUV is only 4.2. No definite worrisome activity identified. Incidental CT findings: Cervical and  lumbar spondylosis. IMPRESSION: 1. Although the primary right upper lobe lesion is smaller than it was on 11/12/2018 at current measurement of 2.3 by 1.6 cm, the lesion is hypermetabolic with maximum SUV of 6.0 compatible with malignancy. Prior SUV was 9.9. 2. In contrast to the reduction in activity in the nodule, there is significant increased size and activity in the right paratracheal and right hilar adenopathy compatible with progressive malignancy. 3. Continued low-grade activity along borderline enlarged AP window lymph nodes. Current maximum SUV is 4.4, previously 3.5. Contralateral mediastinal nodal metastatic disease is not excluded given this appearance. 4. No persuasive evidence of metastatic disease to the neck, abdomen/pelvis, or skeleton. 5. Other imaging findings of potential clinical significance: Aortic Atherosclerosis (ICD10-I70.0). Coronary atherosclerosis. Mild cardiomegaly. Mitral valve calcification. Stable small right pleural effusion. Paraseptal emphysema. Chronic interstitial accentuation in the lungs. Sigmoid diverticulosis. Ascites. Third spacing of fluid in the subcutaneous tissues and mesentery. Severe bilateral renal atrophy. Sigmoid colon diverticulosis. Electronically Signed   By: Van Clines M.D.   On: 01/16/2019 17:19     ASSESSMENT/PLAN:  Stage IIIanon-small cell lung cancer, adenocarcinoma presented with right upper lobe lung mass in addition to right hilar and mediastinal lymphadenopathy. Diagnosed in November 2020. Her PDL1 is 1% and she has no actionable mutations  She is currently undergoing weekly concurrent chemoradiation with carboplatin for an AUC of 2 and paclitaxel 45 mg/m.  She is scheduled to start her first dose of treatment today.  She has her initial consult visit with Dr. Sondra Come and on 02/13/2019.   The patient was seen with Dr. Julien Nordmann today.  Labs were reviewed.  Dr. Julien Nordmann recommends delaying treatment until her concurrent radiation can be  initiated. We will delay her chemotherapy until the week of 02/18/2019.  We will see her back for follow-up visit in 2 weeks for evaluation before starting cycle #1.  The patient was encouraged to keep her appointment with ENT next week as scheduled for her nose bleeds. In the meantime, she was encouraged to use saline nasal sprays to keep it moist. If the patient experiences significant nasal bleeding, she was advised to seek emergency evaluation for uncontrolled  bleeding.   She was encouraged to use imodium as instructed for diarrhea.   The patient was advised to call immediately if she has any concerning symptoms in the interval. The patient voices understanding of current disease status and treatment options and is  in agreement with the current care plan. All questions were answered. The patient knows to call the clinic with any problems, questions or concerns. We can certainly see the patient much sooner if necessary  No orders of the defined types were placed in this encounter.    Krystina Strieter L Noboru Bidinger, PA-C 02/04/19  ADDENDUM: Hematology/Oncology Attending: I had a face-to-face encounter with the patient today.  I recommended her care plan.  This is a very pleasant 68 years old African-American female with end-stage renal disease and currently on hemodialysis.  The patient was also recently diagnosed with a stage IIIa non-small cell lung cancer, adenocarcinoma.  She was supposed to start a course of concurrent chemoradiation but unfortunately she has not seen the radiation oncologist yet and she is scheduled to have an appointment with Dr. Lisbeth Renshaw next week. She was supposed to start the first cycle of her treatment with chemotherapy today. I recommended for the patient to delay the start of her chemotherapy until she is ready to start radiation therapy in early January 2020. For the end-stage renal disease, she will continue on hemodialysis. For the epistaxis, she is scheduled to see  ENT for evaluation. The patient was advised to call immediately if she has any concerning symptoms in the interval.  Disclaimer: This note was dictated with voice recognition software. Similar sounding words can inadvertently be transcribed and may be missed upon review. Eilleen Kempf, MD 02/04/19

## 2019-02-04 ENCOUNTER — Encounter (HOSPITAL_COMMUNITY): Payer: Self-pay | Admitting: Internal Medicine

## 2019-02-04 ENCOUNTER — Inpatient Hospital Stay: Payer: Medicare Other

## 2019-02-04 ENCOUNTER — Ambulatory Visit (INDEPENDENT_AMBULATORY_CARE_PROVIDER_SITE_OTHER): Payer: Medicare Other

## 2019-02-04 ENCOUNTER — Encounter: Payer: Self-pay | Admitting: Internal Medicine

## 2019-02-04 ENCOUNTER — Inpatient Hospital Stay (HOSPITAL_BASED_OUTPATIENT_CLINIC_OR_DEPARTMENT_OTHER): Payer: Medicare Other | Admitting: Physician Assistant

## 2019-02-04 ENCOUNTER — Encounter: Payer: Self-pay | Admitting: Physician Assistant

## 2019-02-04 ENCOUNTER — Other Ambulatory Visit: Payer: Self-pay

## 2019-02-04 VITALS — BP 115/69 | HR 70 | Temp 98.3°F | Resp 17 | Ht 68.8 in | Wt 143.9 lb

## 2019-02-04 DIAGNOSIS — Z992 Dependence on renal dialysis: Secondary | ICD-10-CM

## 2019-02-04 DIAGNOSIS — I10 Essential (primary) hypertension: Secondary | ICD-10-CM | POA: Diagnosis not present

## 2019-02-04 DIAGNOSIS — N186 End stage renal disease: Secondary | ICD-10-CM | POA: Diagnosis not present

## 2019-02-04 DIAGNOSIS — C3491 Malignant neoplasm of unspecified part of right bronchus or lung: Secondary | ICD-10-CM

## 2019-02-04 DIAGNOSIS — R112 Nausea with vomiting, unspecified: Secondary | ICD-10-CM | POA: Diagnosis not present

## 2019-02-04 DIAGNOSIS — J810 Acute pulmonary edema: Secondary | ICD-10-CM | POA: Diagnosis not present

## 2019-02-04 LAB — CMP (CANCER CENTER ONLY)
ALT: 8 U/L (ref 0–44)
AST: 15 U/L (ref 15–41)
Albumin: 2.7 g/dL — ABNORMAL LOW (ref 3.5–5.0)
Alkaline Phosphatase: 167 U/L — ABNORMAL HIGH (ref 38–126)
Anion gap: 14 (ref 5–15)
BUN: 29 mg/dL — ABNORMAL HIGH (ref 8–23)
CO2: 26 mmol/L (ref 22–32)
Calcium: 8.8 mg/dL — ABNORMAL LOW (ref 8.9–10.3)
Chloride: 100 mmol/L (ref 98–111)
Creatinine: 4.81 mg/dL (ref 0.44–1.00)
GFR, Est AFR Am: 10 mL/min — ABNORMAL LOW (ref >60)
GFR, Estimated: 9 mL/min — ABNORMAL LOW (ref >60)
Glucose, Bld: 83 mg/dL (ref 70–99)
Potassium: 4.1 mmol/L (ref 3.5–5.1)
Sodium: 140 mmol/L (ref 135–145)
Total Bilirubin: 0.8 mg/dL (ref 0.3–1.2)
Total Protein: 7.5 g/dL (ref 6.5–8.1)

## 2019-02-04 LAB — CBC WITH DIFFERENTIAL (CANCER CENTER ONLY)
Abs Immature Granulocytes: 0.01 10*3/uL (ref 0.00–0.07)
Basophils Absolute: 0 10*3/uL (ref 0.0–0.1)
Basophils Relative: 1 %
Eosinophils Absolute: 0.1 10*3/uL (ref 0.0–0.5)
Eosinophils Relative: 2 %
HCT: 28.4 % — ABNORMAL LOW (ref 36.0–46.0)
Hemoglobin: 9 g/dL — ABNORMAL LOW (ref 12.0–15.0)
Immature Granulocytes: 0 %
Lymphocytes Relative: 19 %
Lymphs Abs: 0.9 10*3/uL (ref 0.7–4.0)
MCH: 30.5 pg (ref 26.0–34.0)
MCHC: 31.7 g/dL (ref 30.0–36.0)
MCV: 96.3 fL (ref 80.0–100.0)
Monocytes Absolute: 0.5 10*3/uL (ref 0.1–1.0)
Monocytes Relative: 10 %
Neutro Abs: 3.2 10*3/uL (ref 1.7–7.7)
Neutrophils Relative %: 68 %
Platelet Count: 137 10*3/uL — ABNORMAL LOW (ref 150–400)
RBC: 2.95 MIL/uL — ABNORMAL LOW (ref 3.87–5.11)
RDW: 17 % — ABNORMAL HIGH (ref 11.5–15.5)
WBC Count: 4.7 10*3/uL (ref 4.0–10.5)
nRBC: 0 % (ref 0.0–0.2)

## 2019-02-04 NOTE — Progress Notes (Signed)
Met with patient at registration to introduce myself as Financial Resource Specialist and to offer available resources. ° °Discussed one-time $700 [facility] grant and qualifications to assist with personal expenses while going through treatment. ° °Gave her my card if interested in applying and for any additional financial questions or concerns. °

## 2019-02-04 NOTE — Chronic Care Management (AMB) (Signed)
  Chronic Care Management   Outreach Note  02/04/2019 Name: Emillia Weatherly MRN: [id number] DOB: 12-Feb-1951  Referred by: [name], FNP Reason for referral : Chronic Care Management (CCM RNCM Telephone Follow up )   An unsuccessful telephone outreach was attempted today. The patient was referred to the case management team by [name] FNP for assistance with care management and care coordination.   Follow Up Plan: Telephone follow up appointment with care management team member scheduled for: 02/10/19  Barb Merino, RN, BSN, CCM Care Management Coordinator [facility]  Direct Phone: [phone]

## 2019-02-04 NOTE — Progress Notes (Signed)
Received call from lab for critical Crea level  4.81 ;  Message relayed to Maudie Mercury, Therapist, sports for [place], [place].

## 2019-02-05 ENCOUNTER — Emergency Department (HOSPITAL_COMMUNITY): Payer: Medicare Other

## 2019-02-05 ENCOUNTER — Telehealth: Payer: Self-pay | Admitting: Internal Medicine

## 2019-02-05 ENCOUNTER — Encounter (HOSPITAL_COMMUNITY): Payer: Self-pay | Admitting: Internal Medicine

## 2019-02-05 ENCOUNTER — Inpatient Hospital Stay (HOSPITAL_COMMUNITY)
Admission: EM | Admit: 2019-02-05 | Discharge: 2019-02-08 | DRG: 189 | Disposition: A | Discharge status: Home / Self Care | Payer: Medicare Other | Source: Other Acute Inpatient Hospital | Attending: Internal Medicine | Admitting: Internal Medicine

## 2019-02-05 ENCOUNTER — Other Ambulatory Visit: Payer: Self-pay

## 2019-02-05 DIAGNOSIS — J9601 Acute respiratory failure with hypoxia: Secondary | ICD-10-CM | POA: Diagnosis present

## 2019-02-05 DIAGNOSIS — Z20828 Contact with and (suspected) exposure to other viral communicable diseases: Secondary | ICD-10-CM | POA: Diagnosis present

## 2019-02-05 DIAGNOSIS — Z992 Dependence on renal dialysis: Secondary | ICD-10-CM | POA: Diagnosis not present

## 2019-02-05 DIAGNOSIS — I120 Hypertensive chronic kidney disease with stage 5 chronic kidney disease or end stage renal disease: Secondary | ICD-10-CM | POA: Diagnosis not present

## 2019-02-05 DIAGNOSIS — I5042 Chronic combined systolic (congestive) and diastolic (congestive) heart failure: Secondary | ICD-10-CM | POA: Diagnosis present

## 2019-02-05 DIAGNOSIS — I10 Essential (primary) hypertension: Secondary | ICD-10-CM | POA: Diagnosis present

## 2019-02-05 DIAGNOSIS — N186 End stage renal disease: Secondary | ICD-10-CM | POA: Diagnosis present

## 2019-02-05 DIAGNOSIS — Z8249 Family history of ischemic heart disease and other diseases of the circulatory system: Secondary | ICD-10-CM

## 2019-02-05 DIAGNOSIS — Z743 Need for continuous supervision: Secondary | ICD-10-CM | POA: Diagnosis not present

## 2019-02-05 DIAGNOSIS — E785 Hyperlipidemia, unspecified: Secondary | ICD-10-CM | POA: Diagnosis present

## 2019-02-05 DIAGNOSIS — Z833 Family history of diabetes mellitus: Secondary | ICD-10-CM

## 2019-02-05 DIAGNOSIS — I2510 Atherosclerotic heart disease of native coronary artery without angina pectoris: Secondary | ICD-10-CM | POA: Diagnosis present

## 2019-02-05 DIAGNOSIS — K219 Gastro-esophageal reflux disease without esophagitis: Secondary | ICD-10-CM | POA: Diagnosis present

## 2019-02-05 DIAGNOSIS — C3491 Malignant neoplasm of unspecified part of right bronchus or lung: Secondary | ICD-10-CM | POA: Diagnosis present

## 2019-02-05 DIAGNOSIS — R0902 Hypoxemia: Secondary | ICD-10-CM

## 2019-02-05 DIAGNOSIS — K529 Noninfective gastroenteritis and colitis, unspecified: Secondary | ICD-10-CM

## 2019-02-05 DIAGNOSIS — I252 Old myocardial infarction: Secondary | ICD-10-CM | POA: Diagnosis not present

## 2019-02-05 DIAGNOSIS — I132 Hypertensive heart and chronic kidney disease with heart failure and with stage 5 chronic kidney disease, or end stage renal disease: Secondary | ICD-10-CM | POA: Diagnosis present

## 2019-02-05 DIAGNOSIS — R112 Nausea with vomiting, unspecified: Secondary | ICD-10-CM | POA: Diagnosis not present

## 2019-02-05 DIAGNOSIS — R5381 Other malaise: Secondary | ICD-10-CM | POA: Diagnosis not present

## 2019-02-05 DIAGNOSIS — R040 Epistaxis: Secondary | ICD-10-CM | POA: Diagnosis not present

## 2019-02-05 DIAGNOSIS — K5730 Diverticulosis of large intestine without perforation or abscess without bleeding: Secondary | ICD-10-CM | POA: Diagnosis present

## 2019-02-05 DIAGNOSIS — Z7982 Long term (current) use of aspirin: Secondary | ICD-10-CM | POA: Diagnosis not present

## 2019-02-05 DIAGNOSIS — Z803 Family history of malignant neoplasm of breast: Secondary | ICD-10-CM

## 2019-02-05 DIAGNOSIS — Z800 Family history of malignant neoplasm of digestive organs: Secondary | ICD-10-CM

## 2019-02-05 DIAGNOSIS — C787 Secondary malignant neoplasm of liver and intrahepatic bile duct: Secondary | ICD-10-CM | POA: Diagnosis present

## 2019-02-05 DIAGNOSIS — M1990 Unspecified osteoarthritis, unspecified site: Secondary | ICD-10-CM | POA: Diagnosis present

## 2019-02-05 DIAGNOSIS — J810 Acute pulmonary edema: Principal | ICD-10-CM | POA: Diagnosis present

## 2019-02-05 DIAGNOSIS — F17210 Nicotine dependence, cigarettes, uncomplicated: Secondary | ICD-10-CM | POA: Diagnosis present

## 2019-02-05 DIAGNOSIS — M109 Gout, unspecified: Secondary | ICD-10-CM | POA: Diagnosis present

## 2019-02-05 DIAGNOSIS — R918 Other nonspecific abnormal finding of lung field: Secondary | ICD-10-CM | POA: Diagnosis present

## 2019-02-05 DIAGNOSIS — I214 Non-ST elevation (NSTEMI) myocardial infarction: Secondary | ICD-10-CM | POA: Diagnosis present

## 2019-02-05 DIAGNOSIS — R1011 Right upper quadrant pain: Secondary | ICD-10-CM

## 2019-02-05 LAB — COMPREHENSIVE METABOLIC PANEL
ALT: 12 U/L (ref 0–44)
AST: 15 U/L (ref 15–41)
Albumin: 2.5 g/dL — ABNORMAL LOW (ref 3.5–5.0)
Alkaline Phosphatase: 152 U/L — ABNORMAL HIGH (ref 38–126)
Anion gap: 14 (ref 5–15)
BUN: 39 mg/dL — ABNORMAL HIGH (ref 8–23)
CO2: 25 mmol/L (ref 22–32)
Calcium: 8.7 mg/dL — ABNORMAL LOW (ref 8.9–10.3)
Chloride: 100 mmol/L (ref 98–111)
Creatinine, Ser: 5.84 mg/dL — ABNORMAL HIGH (ref 0.44–1.00)
GFR calc Af Amer: 8 mL/min — ABNORMAL LOW (ref >60)
GFR calc non Af Amer: 7 mL/min — ABNORMAL LOW (ref >60)
Glucose, Bld: 79 mg/dL (ref 70–99)
Potassium: 4.1 mmol/L (ref 3.5–5.1)
Sodium: 139 mmol/L (ref 135–145)
Total Bilirubin: 0.9 mg/dL (ref 0.3–1.2)
Total Protein: 7.3 g/dL (ref 6.5–8.1)

## 2019-02-05 LAB — POCT I-STAT 7, (LYTES, BLD GAS, ICA,H+H)
Acid-Base Excess: 3 mmol/L — ABNORMAL HIGH (ref 0.0–2.0)
Bicarbonate: 27.5 mmol/L (ref 20.0–28.0)
Calcium, Ion: 1.15 mmol/L (ref 1.15–1.40)
HCT: 27 % — ABNORMAL LOW (ref 36.0–46.0)
Hemoglobin: 9.2 g/dL — ABNORMAL LOW (ref 12.0–15.0)
O2 Saturation: 84 %
Patient temperature: 98
Potassium: 4.2 mmol/L (ref 3.5–5.1)
Sodium: 139 mmol/L (ref 135–145)
TCO2: 29 mmol/L (ref 22–32)
pCO2 arterial: 39.4 mmHg (ref 32.0–48.0)
pH, Arterial: 7.45 (ref 7.350–7.450)
pO2, Arterial: 45 mmHg — ABNORMAL LOW (ref 83.0–108.0)

## 2019-02-05 LAB — LIPASE, BLOOD: Lipase: 24 U/L (ref 11–51)

## 2019-02-05 LAB — CBC WITH DIFFERENTIAL/PLATELET
Abs Immature Granulocytes: 0.01 10*3/uL (ref 0.00–0.07)
Basophils Absolute: 0 10*3/uL (ref 0.0–0.1)
Basophils Relative: 1 %
Eosinophils Absolute: 0.1 10*3/uL (ref 0.0–0.5)
Eosinophils Relative: 1 %
HCT: 28.1 % — ABNORMAL LOW (ref 36.0–46.0)
Hemoglobin: 9 g/dL — ABNORMAL LOW (ref 12.0–15.0)
Immature Granulocytes: 0 %
Lymphocytes Relative: 15 %
Lymphs Abs: 1 10*3/uL (ref 0.7–4.0)
MCH: 30.7 pg (ref 26.0–34.0)
MCHC: 32 g/dL (ref 30.0–36.0)
MCV: 95.9 fL (ref 80.0–100.0)
Monocytes Absolute: 0.7 10*3/uL (ref 0.1–1.0)
Monocytes Relative: 10 %
Neutro Abs: 4.8 10*3/uL (ref 1.7–7.7)
Neutrophils Relative %: 73 %
Platelets: 148 10*3/uL — ABNORMAL LOW (ref 150–400)
RBC: 2.93 MIL/uL — ABNORMAL LOW (ref 3.87–5.11)
RDW: 17 % — ABNORMAL HIGH (ref 11.5–15.5)
WBC: 6.5 10*3/uL (ref 4.0–10.5)
nRBC: 0 % (ref 0.0–0.2)

## 2019-02-05 LAB — I-STAT CHEM 8, ED
BUN: 49 mg/dL — ABNORMAL HIGH (ref 8–23)
Calcium, Ion: 1.08 mmol/L — ABNORMAL LOW (ref 1.15–1.40)
Chloride: 100 mmol/L (ref 98–111)
Creatinine, Ser: 5.5 mg/dL — ABNORMAL HIGH (ref 0.44–1.00)
Glucose, Bld: 72 mg/dL (ref 70–99)
HCT: 28 % — ABNORMAL LOW (ref 36.0–46.0)
Hemoglobin: 9.5 g/dL — ABNORMAL LOW (ref 12.0–15.0)
Potassium: 4.4 mmol/L (ref 3.5–5.1)
Sodium: 140 mmol/L (ref 135–145)
TCO2: 29 mmol/L (ref 22–32)

## 2019-02-05 LAB — TROPONIN I (HIGH SENSITIVITY)
Troponin I (High Sensitivity): 25 ng/L — ABNORMAL HIGH (ref <18)
Troponin I (High Sensitivity): 23 ng/L — ABNORMAL HIGH (ref <18)

## 2019-02-05 LAB — POC SARS CORONAVIRUS 2 AG -  ED: SARS Coronavirus 2 Ag: NEGATIVE

## 2019-02-05 MED ORDER — CINACALCET HCL 30 MG PO TABS
30.0000 mg | ORAL_TABLET | Freq: Every day | ORAL | Status: DC
  Administered 2019-02-06 – 2019-02-08 (×3): 30 mg via ORAL
  Filled 2019-02-05 (×3): qty 1

## 2019-02-05 MED ORDER — PRO-STAT SUGAR FREE PO LIQD
30.0000 mL | Freq: Two times a day (BID) | ORAL | Status: DC
  Administered 2019-02-06 (×2): 30 mL via ORAL
  Filled 2019-02-05 (×4): qty 30

## 2019-02-05 MED ORDER — HEPARIN SODIUM (PORCINE) 5000 UNIT/ML IJ SOLN
5000.0000 [IU] | Freq: Three times a day (TID) | INTRAMUSCULAR | Status: DC
  Administered 2019-02-06 – 2019-02-08 (×8): 5000 [IU] via SUBCUTANEOUS
  Filled 2019-02-05 (×8): qty 1

## 2019-02-05 MED ORDER — ONDANSETRON HCL 4 MG/2ML IJ SOLN: 4.0000 mg | Freq: Four times a day (QID) | INTRAMUSCULAR | Status: DC | PRN

## 2019-02-05 MED ORDER — THIAMINE HCL 100 MG PO TABS
100.0000 mg | ORAL_TABLET | Freq: Every day | ORAL | Status: DC
  Administered 2019-02-06 – 2019-02-08 (×3): 100 mg via ORAL
  Filled 2019-02-05 (×3): qty 1

## 2019-02-05 MED ORDER — ACETAMINOPHEN 650 MG RE SUPP: 650.0000 mg | Freq: Four times a day (QID) | RECTAL | Status: DC | PRN

## 2019-02-05 MED ORDER — ATORVASTATIN CALCIUM 10 MG PO TABS
10.0000 mg | ORAL_TABLET | Freq: Every day | ORAL | Status: DC
  Administered 2019-02-06 – 2019-02-07 (×3): 10 mg via ORAL
  Filled 2019-02-05 (×3): qty 1

## 2019-02-05 MED ORDER — ONDANSETRON 4 MG PO TBDP: ORAL_TABLET | ORAL | 0 refills | Status: DC

## 2019-02-05 MED ORDER — RENA-VITE PO TABS
1.0000 | ORAL_TABLET | Freq: Every day | ORAL | Status: DC
  Administered 2019-02-06 – 2019-02-07 (×3): 1 via ORAL
  Filled 2019-02-05 (×3): qty 1

## 2019-02-05 MED ORDER — ONDANSETRON HCL 4 MG/2ML IJ SOLN
4.0000 mg | Freq: Once | INTRAMUSCULAR | Status: AC
  Administered 2019-02-05: 4 mg via INTRAVENOUS
  Filled 2019-02-05: qty 2

## 2019-02-05 MED ORDER — ACETAMINOPHEN 325 MG PO TABS: 650.0000 mg | ORAL_TABLET | Freq: Four times a day (QID) | ORAL | Status: DC | PRN

## 2019-02-05 MED ORDER — SUCROFERRIC OXYHYDROXIDE 500 MG PO CHEW
1000.0000 mg | CHEWABLE_TABLET | Freq: Three times a day (TID) | ORAL | Status: DC
  Administered 2019-02-06 – 2019-02-07 (×3): 1000 mg via ORAL
  Filled 2019-02-05 (×4): qty 2

## 2019-02-05 MED ORDER — VITAMIN D 25 MCG (1000 UNIT) PO TABS
1000.0000 [IU] | ORAL_TABLET | Freq: Every day | ORAL | Status: DC
  Administered 2019-02-06 – 2019-02-08 (×3): 1000 [IU] via ORAL
  Filled 2019-02-05 (×3): qty 1

## 2019-02-05 MED ORDER — CALCITRIOL 0.25 MCG PO CAPS: 1.0000 ug | ORAL_CAPSULE | ORAL | Status: DC

## 2019-02-05 MED ORDER — ATENOLOL 25 MG PO TABS: 100.0000 mg | ORAL_TABLET | Freq: Every day | ORAL | Status: DC

## 2019-02-05 MED ORDER — AMOXICILLIN-POT CLAVULANATE 875-125 MG PO TABS: 1.0000 | ORAL_TABLET | Freq: Once | ORAL | Status: DC

## 2019-02-05 MED ORDER — ONDANSETRON HCL 4 MG PO TABS: 4.0000 mg | ORAL_TABLET | Freq: Four times a day (QID) | ORAL | Status: DC | PRN

## 2019-02-05 MED ORDER — MIDODRINE HCL 5 MG PO TABS
10.0000 mg | ORAL_TABLET | Freq: Two times a day (BID) | ORAL | Status: DC
  Administered 2019-02-06 – 2019-02-08 (×6): 10 mg via ORAL
  Filled 2019-02-05 (×6): qty 2

## 2019-02-05 MED ORDER — ALLOPURINOL 100 MG PO TABS
100.0000 mg | ORAL_TABLET | Freq: Every day | ORAL | Status: DC
  Administered 2019-02-06 – 2019-02-08 (×3): 100 mg via ORAL
  Filled 2019-02-05 (×3): qty 1

## 2019-02-05 NOTE — ED Provider Notes (Signed)
[facility] EMERGENCY DEPARTMENT Provider Note   CSN: [id number] Arrival date & time: 02/05/19  1156     History Chief Complaint  Patient presents with  . Emesis  . Weakness    Jessica Clarke is a 68 y.o. female history of CHF, non small cell lung cancer with mets to the liver, here presenting with nausea and vomiting and abdominal pain. Patient saw her oncologist yesterday was at baseline health.  She states that she has right-sided abdominal pain since yesterday.  It is associated with nausea vomiting as well.  She states that the pain radiated to her right side of her chest as well .  She went to dialysis did not appear well so she was sent here for further evaluation .  Patient did not get dialyzed today but last dialysis was 2 days ago.   Non   The history is provided by the patient.       Past Medical History:  Diagnosis Date  . Abnormal Pap smear    ASCUS ?HGSIL  . Anemia   . Arthritis   . Cancer ([place])    lung ( per daughter)  . Cervical dysplasia   . CHF (congestive heart failure) ([place])   . Chronic kidney disease   . Constipation   . Constipation   . Dyspnea   . GERD (gastroesophageal reflux disease)   . Gout   . Hyperlipidemia   . Hypertension   . Myocardial infarction ([place]) 2015  . Obesity   . Pneumonia   . Uterine polyp     Patient Active Problem List   Diagnosis Date Noted  . Adenocarcinoma of right lung, stage 3 ([place]) 12/26/2018  . Goals of care, counseling/discussion 12/26/2018  . Mediastinal adenopathy 12/19/2018  . Lung mass 11/05/2018  . Polyp of colon   . Diarrhea 10/18/2018  . Rectal bleeding 10/18/2018  . Symptomatic anemia 10/17/2018  . Lung nodule < 6cm on CT 03/05/2018  . Malnutrition of moderate degree 08/28/2016  . Respiratory distress 08/26/2016  . ESRD on dialysis ([place]) 06/08/2015  . CAD- non obstructive RI disease 08/26/13 08/27/2013  . NSTEMI-08/25/13- non obstructive CAD at cath 08/26/13 08/26/2013  . Chronic  combined systolic and diastolic heart failure ([place]) 08/26/2013  . Alcohol abuse 08/25/2013  . Respiratory failure with hypoxia and hypercapnia ([place]) 08/25/2013  . Flash pulmonary edema ([place]) 08/25/2013  . Encounter for adequacy testing for hemodialysis ([place]) 06/26/2012  . End stage renal disease ([place]) 12/27/2011  . ASCUS (atypical squamous cells of undetermined significance) on Pap smear 09/09/2010  . Hypertension 09/09/2010  . Cervical polyp 09/09/2010  . Smoker 09/09/2010  . Post-menopausal bleeding 09/09/2010  . Gout 09/09/2010    Past Surgical History:  Procedure Laterality Date  . AV FISTULA PLACEMENT  01/18/2012   Procedure: ARTERIOVENOUS (AV) FISTULA CREATION;  Surgeon: Angelia Mould, MD;  Location: [facility];  Service: Vascular;  Laterality: Left;  . BIOPSY  10/20/2018   Procedure: BIOPSY;  Surgeon: Thornton Park, MD;  Location: [facility];  Service: Gastroenterology;;  . COLONOSCOPY WITH PROPOFOL N/A 10/20/2018   Procedure: COLONOSCOPY WITH PROPOFOL;  Surgeon: Thornton Park, MD;  Location: [facility];  Service: Gastroenterology;  Laterality: N/A;  . DILATION AND CURETTAGE OF UTERUS    . HYSTEROSCOPY    . LEEP    . LEFT HEART CATHETERIZATION WITH CORONARY ANGIOGRAM N/A 08/26/2013   Procedure: LEFT HEART CATHETERIZATION WITH CORONARY ANGIOGRAM;  Surgeon: Leonie Man, MD;  Location: [facility] CATH LAB;  Service: Cardiovascular;  Laterality: N/A;  . POLYPECTOMY  10/20/2018   Procedure: POLYPECTOMY;  Surgeon: Thornton Park, MD;  Location: [facility] ENDOSCOPY;  Service: Gastroenterology;;  . REVISON OF ARTERIOVENOUS FISTULA Left 2/42/3536   Procedure: PLICATION OF ARTERIOVENOUS FISTULA;  Surgeon: Conrad , MD;  Location: [facility];  Service: Vascular;  Laterality: Left;  Marland Kitchen VIDEO BRONCHOSCOPY WITH ENDOBRONCHIAL ULTRASOUND N/A 12/19/2018   Procedure: VIDEO BRONCHOSCOPY WITH ENDOBRONCHIAL ULTRASOUND;  Surgeon: Garner Nash, DO;  Location: [facility] OR;  Service: Thoracic;  Laterality:  N/A;     OB History    Gravida  2   Para  1   Term      Preterm      AB  1   Living  1     SAB      TAB  1   Ectopic      Multiple      Live Births              Family History  Problem Relation Age of Onset  . Esophageal cancer Other   . Pancreatic cancer Other   . Heart disease Mother   . Hypertension Mother   . Breast cancer Sister   . Esophageal cancer Sister   . Cancer Sister   . Deep vein thrombosis Sister   . Diabetes Sister   . Hypertension Sister   . Hypertension Daughter   . Colon cancer Neg Hx     Social History   Tobacco Use  . Smoking status: Current Every Day Smoker    Packs/day: 0.25    Years: 30.00    Pack years: 7.50    Types: Cigarettes  . Smokeless tobacco: Never Used  . Tobacco comment: smoking cessation  Substance Use Topics  . Alcohol use: Not Currently  . Drug use: No    Home Medications Prior to Admission medications   Medication Sig Start Date End Date Taking? Authorizing Provider  acetaminophen (TYLENOL) 325 MG tablet Take 325-650 mg by mouth every 8 (eight) hours as needed for mild pain.     [provider]  allopurinol (ZYLOPRIM) 100 MG tablet Take 100 mg by mouth daily.      [provider]  Amino Acids-Protein Hydrolys (FEEDING SUPPLEMENT, PRO-STAT SUGAR FREE 64,) LIQD Take 30 mLs by mouth 2 (two) times daily. 10/26/18   Hosie Poisson, MD  aspirin EC 325 MG tablet Take 1 tablet (325 mg total) by mouth daily. Patient not taking: Reported on 01/21/2019 08/27/13   Thurnell Lose, MD  atenolol (TENORMIN) 100 MG tablet Take 100 mg by mouth daily.  08/29/13   [provider]  atorvastatin (LIPITOR) 10 MG tablet Take 10 mg by mouth at bedtime.  01/30/16   [provider]  B Complex-Folic Acid (B COMPLEX FORMULA 1) TABS Take 1 tablet by mouth daily.  07/11/17   [provider]  calcitRIOL (ROCALTROL) 0.5 MCG capsule Take 2 capsules (1 mcg total) by mouth every Monday, Wednesday,  and Friday with hemodialysis. 10/28/18   Hosie Poisson, MD  cinacalcet (SENSIPAR) 30 MG tablet Take 1 tablet (30 mg total) by mouth daily with supper. 10/26/18   Hosie Poisson, MD  colchicine 0.6 MG tablet Take 0.6 mg by mouth daily as needed (as directed for gout flares or pain).  08/14/18   [provider]  Darbepoetin Alfa (ARANESP) 60 MCG/0.3ML SOSY injection Inject 0.3 mLs (60 mcg total) into the vein every Friday with hemodialysis. 11/01/18   Hosie Poisson,  MD  hydrocerin (EUCERIN) CREA Apply 1 application topically 2 (two) times daily. Patient not taking: Reported on 01/20/2019 10/26/18   Hosie Poisson, MD  ipratropium-albuterol (DUONEB) 0.5-2.5 (3) MG/3ML SOLN Take 3 mLs by nebulization every 6 (six) hours as needed. Patient not taking: Reported on 01/21/2019 01/15/19   June Leap L, DO  loperamide (IMODIUM) 2 MG capsule Take 2 mg by mouth daily as needed for diarrhea or loose stools.  06/24/18 06/23/19  [provider]  midodrine (PROAMATINE) 10 MG tablet Take 1 tablet (10 mg total) by mouth 2 (two) times daily with a meal. 10/26/18   Hosie Poisson, MD  multivitamin (RENA-VIT) TABS tablet Take 1 tablet by mouth at bedtime. 10/26/18   Hosie Poisson, MD  nicotine (NICODERM CQ - DOSED IN MG/24 HOURS) 14 mg/24hr patch Place 1 patch (14 mg total) onto the skin daily as needed (smoking craving). Patient not taking: Reported on 01/21/2019 10/26/18   Hosie Poisson, MD  prochlorperazine (COMPAZINE) 10 MG tablet Take 1 tablet (10 mg total) by mouth every 6 (six) hours as needed for nausea or vomiting. 01/21/19   Heilingoetter, Cassandra L, PA-C  thiamine (VITAMIN B-1) 100 MG tablet Take 100 mg by mouth daily.    [provider]  VELPHORO 500 MG chewable tablet Chew 1,000 mg by mouth 3 (three) times daily with meals.  06/30/16   [provider]  Vitamin D, Cholecalciferol, 25 MCG (1000 UT) TABS Take 1,000 Units by mouth daily.  08/12/18 08/11/19  [provider]     Allergies    Patient has no known allergies.  Review of Systems   Review of Systems  Gastrointestinal: Positive for vomiting.  Neurological: Positive for weakness.  All other systems reviewed and are negative.   Physical Exam Updated Vital Signs BP 122/70 (BP Location: Left Arm)   Pulse 70   Temp 97.9 F (36.6 C) (Oral)   Resp 16   SpO2 90%   Physical Exam Vitals reviewed.  Constitutional:      Comments: Chronically ill   HENT:     Head: Normocephalic.     Nose: Nose normal.     Mouth/Throat:     Mouth: Mucous membranes are moist.  Eyes:     Extraocular Movements: Extraocular movements intact.     Pupils: Pupils are equal, round, and reactive to light.  Cardiovascular:     Rate and Rhythm: Normal rate and regular rhythm.     Pulses: Normal pulses.  Pulmonary:     Comments: Crackles bilateral bases  Abdominal:     Comments: + RUQ tenderness, ? Ascites   Musculoskeletal:        General: Normal range of motion.     Cervical back: Normal range of motion.  Skin:    General: Skin is warm.     Capillary Refill: Capillary refill takes less than 2 seconds.  Neurological:     General: No focal deficit present.     Mental Status: She is alert.  Psychiatric:        Mood and Affect: Mood normal.        Behavior: Behavior normal.     ED Results / Procedures / Treatments   Labs (all labs ordered are listed, but only abnormal results are displayed) Labs Reviewed  CBC WITH DIFFERENTIAL/PLATELET - Abnormal; Notable for the following components:      Result Value   RBC 2.93 (*)    Hemoglobin 9.0 (*)    HCT 28.1 (*)  RDW 17.0 (*)    Platelets 148 (*)    All other components within normal limits  COMPREHENSIVE METABOLIC PANEL - Abnormal; Notable for the following components:   BUN 39 (*)    Creatinine, Ser 5.84 (*)    Calcium 8.7 (*)    Albumin 2.5 (*)    Alkaline Phosphatase 152 (*)    GFR calc non Af Amer 7 (*)    GFR calc Af Amer 8 (*)    All other  components within normal limits  TROPONIN I (HIGH SENSITIVITY) - Abnormal; Notable for the following components:   Troponin I (High Sensitivity) 25 (*)    All other components within normal limits  LIPASE, BLOOD  I-STAT CHEM 8, ED  POC SARS CORONAVIRUS 2 AG -  ED  TROPONIN I (HIGH SENSITIVITY)    EKG None  Radiology DG Chest 2 View  Result Date: 02/05/2019 CLINICAL DATA:  68 year old female with nausea vomiting and pleuritic chest pain. EXAM: CHEST - 2 VIEW COMPARISON:  Portable chest 10/22/2018 and earlier. CT chest 11/12/2018. FINDINGS: Spiculated right upper lobe lung mass demonstrated by CT has been poorly visible radiographically but might have regressed. Right pleural effusion probably persists, small. Stable cardiomegaly and mediastinal contours. No pneumothorax. No definite left pleural effusion. Pulmonary vascularity is unchanged with symmetric vascular congestion suspected. No new confluent opacity. Negative visible bowel gas pattern. No acute osseous abnormality identified. IMPRESSION: 1. Spiculated right upper lobe lung mass has been better demonstrated by CT. 2. Cardiomegaly with continued pulmonary vascular congestion suspicious for mild interstitial edema. Small right pleural effusion suspected. 3. No pneumothorax or other acute cardiopulmonary abnormality. Electronically Signed   By: Genevie Ann M.D.   On: 02/05/2019 13:24    Procedures Procedures (including critical care time)  Medications Ordered in ED Medications  ondansetron (ZOFRAN) injection 4 mg (4 mg Intravenous Given 02/05/19 1323)    ED Course  I have reviewed the triage vital signs and the nursing notes.  Pertinent labs & imaging results that were available during my care of the patient were reviewed by me and considered in my medical decision making (see chart for details).    MDM Rules/Calculators/A&P                      Lynea Rollison is a 68 y.o. female here with R ab pain.  Patient has a history of  lung cancer with mets to the liver .  Patient is also on dialysis. Last dialysis was 2 days ago .  Patient appears fluid overloaded.  Will get chest x-ray and CT abdomen pelvis and labs.  Patient just saw oncology yesterday.  3:13 PM Labs showed baseline renal failure. CXR showed known lung mass. CT showed known anasarca and possible enteritis. Afebrile, no leukocytosis to suggest bacterial process. I have difficulty picking up her O2 sat with pulse ox. Will get ABG, COVID. Signed out to Dr. Johnney Killian to check COVID testing and ABG. If ABG reassuring and COVID antigen negative, she can go to dialysis tomorrow for make up session. Dr. Justin Mend from nephrology consulted.   Final Clinical Impression(s) / ED Diagnoses Final diagnoses:  None    Rx / DC Orders ED Discharge Orders    None       Drenda Freeze, MD 02/05/19 1515

## 2019-02-05 NOTE — Discharge Instructions (Signed)
You have known lung mass on xray. Your CT showed viral enteritis   Take zofran for nausea   Go to dialysis tomorrow to make up your dialysis today   See your doctor  Return to ER if you have worse nausea, vomiting, abdominal pain, fever

## 2019-02-05 NOTE — Progress Notes (Signed)
Pt arrived to 2 west 21, alert and oriented x 4, pt identified appropriately. VS stable, denied chest pain, SOB and no acute distress noted.  Pt placed on progressive care bedside monitor, assessment completed and admission hx obtained. Pt oriented to room and equipment, instructed to call for assistance and how to use call bell, and call bell left within pt reach. Bed alarm in place. Will continue to monitor pt and treat per MD orders.

## 2019-02-05 NOTE — Telephone Encounter (Signed)
Cancelled 12/29 appts per los. Other appts already scheduled. Called and left msg about cancelled appts

## 2019-02-05 NOTE — ED Notes (Signed)
Back from xray

## 2019-02-05 NOTE — ED Triage Notes (Signed)
Pt arrives via EMS from dialysis, with generalized weakness, Nausea, vomiting, abdominal pain since yesterday. Pt has MWF dialysis sessions. Alert, oriented x4, 118/64, HR 80, 16RR, 97.2 temp, 98% RA.

## 2019-02-05 NOTE — ED Notes (Signed)
Daughter Mrs. [name] updated about POC, pt to be admitted to the hospital. Visitor hours reviewed with the daughter.

## 2019-02-05 NOTE — Progress Notes (Signed)
ABG drawn per ED MD order for SAT correlation. PaO2 45 on RA. RT placed pt on 4L [place] and repositioned SAT probe. SATs reading 100% on 4L, pt is in no acute respiratory distress. She is able to answer questions appropriately and follow commands. Vitals are stable at this time.   Results for DESTENI, PISCOPO (MRN [id number]) as of 02/05/2019 15:53  Ref. Range 02/05/2019 15:41  Sample type Unknown ARTERIAL  pH, Arterial Latest Ref Range: 7.350 - 7.450  7.450  pCO2 arterial Latest Ref Range: 32.0 - 48.0 mmHg 39.4  pO2, Arterial Latest Ref Range: 83.0 - 108.0 mmHg 45.0 (L)  TCO2 Latest Ref Range: 22 - 32 mmol/L 29  Acid-Base Excess Latest Ref Range: 0.0 - 2.0 mmol/L 3.0 (H)  Bicarbonate Latest Ref Range: 20.0 - 28.0 mmol/L 27.5  O2 Saturation Latest Units: % 84.0  Patient temperature Unknown 98.0 F

## 2019-02-05 NOTE — ED Provider Notes (Signed)
Sat readings varible, ABG pending. Abdo pain, CP. If 2nd trop, Covid and ABG OK. Reassess for possible discharge.  Patient reports that she has having problems with shortness of breath, nausea and weakness.  Reports she has abdominal discomfort that seems to migrate around and radiate up to her right upper quadrant.  Patient reports she does not wear any home oxygen.   Physical Exam  BP 122/70 (BP Location: Left Arm)   Pulse 70   Temp 97.9 F (36.6 C) (Oral)   Resp 16   SpO2 90%   Physical Exam  ED Course/Procedures     Procedures  MDM  Consult: Reviewed with Dr. Carolin Sicks of nephrology.  Advised to the patient is hypoxic on room air and does not wear home oxygen.  She does have volume overload clinically as well as on chest x-ray and CT.  We will plan to admit for observation and continued supplemental oxygen with dialysis ASAP.  Consult: Dr. Hal Hope for admission  Patient presented as outlined above.  ABG obtained to better quantify degree of hypoxia.  Patient has PaO2 of 45 mmHg on room air.  This correlates to approximately 84% saturation.  Patient has been maintained throughout her stay in the emergency department on 4 L of oxygen.  I do not feel the patient will be stable overnight off of oxygen to await dialysis in the morning.  I feel she is at high risk of decompensating.  Plan will be for observation on oxygen and dialysis ASAP.       Charlesetta Shanks, MD 02/05/19 [phone]

## 2019-02-05 NOTE — ED Notes (Signed)
Daughter Mr. [name] will like to be call in the morning with updates about room placement, please call [phone].

## 2019-02-05 NOTE — H&P (Signed)
History and Physical    Jessica Clarke VEL:[id number] DOB: 1951-01-30 DOA: 02/05/2019  PCP: [name], FNP  Patient coming from: Home.  History obtained from patient's daughter.  Chief Complaint: Abdominal pain.  HPI: Jessica Clarke is a 68 y.o. female with history of ESRD on hemodialysis on Monday Wednesday Friday, recently diagnosed non-small cell lung cancer planning to have chemo/radiation next month, hypertension, anemia who was admitted in September 2020 for anemia at that time colonoscopy was showing only diverticulosis and also had E. coli colitis for which patient was treated with Cipro at that same admission was brought to the ER from the dialysis center after patient was not feeling well.  Per daughter patient has been having chronic diarrhea for last 2 to 3 months with at times having vomiting.  Has been having abdominal pain mostly in the upper quadrants.  Yesterday patient was transferred before dialysis.  ED Course: In the ER patient had labs drawn which showed alkaline phosphatase of 152 albumin 2.5 lipase 24 AST ALT were normal.  High sensitive troponin were 25 and 23.  EKG shows normal sinus rhythm.  CT of the abdomen done without contrast showed features concerning for enteritis.  CT also showed pleural effusion.  Chest x-ray showed pleural effusion and congestion.  COVID-19 was negative.  Patient required 4 L oxygen at this time and nephrology was notified.  Patient admitted for abdominal pain with enteritis and chronic diarrhea with acute respiratory failure likely from fluid overload.  Review of Systems: As per HPI, rest all negative.   Past Medical History:  Diagnosis Date  . Abnormal Pap smear    ASCUS ?HGSIL  . Anemia   . Arthritis   . Cancer ([place])    lung ( per daughter)  . Cervical dysplasia   . CHF (congestive heart failure) ([place])   . Chronic kidney disease   . Constipation   . Constipation   . Dyspnea   . GERD (gastroesophageal reflux disease)   .  Gout   . Hyperlipidemia   . Hypertension   . Myocardial infarction ([place]) 2015  . Obesity   . Pneumonia   . Uterine polyp     Past Surgical History:  Procedure Laterality Date  . AV FISTULA PLACEMENT  01/18/2012   Procedure: ARTERIOVENOUS (AV) FISTULA CREATION;  Surgeon: Angelia Mould, MD;  Location: [place];  Service: Vascular;  Laterality: Left;  . BIOPSY  10/20/2018   Procedure: BIOPSY;  Surgeon: Thornton Park, MD;  Location: [place];  Service: Gastroenterology;;  . COLONOSCOPY WITH PROPOFOL N/A 10/20/2018   Procedure: COLONOSCOPY WITH PROPOFOL;  Surgeon: Thornton Park, MD;  Location: [place];  Service: Gastroenterology;  Laterality: N/A;  . DILATION AND CURETTAGE OF UTERUS    . HYSTEROSCOPY    . LEEP    . LEFT HEART CATHETERIZATION WITH CORONARY ANGIOGRAM N/A 08/26/2013   Procedure: LEFT HEART CATHETERIZATION WITH CORONARY ANGIOGRAM;  Surgeon: Leonie Man, MD;  Location: [facility] CATH LAB;  Service: Cardiovascular;  Laterality: N/A;  . POLYPECTOMY  10/20/2018   Procedure: POLYPECTOMY;  Surgeon: Thornton Park, MD;  Location: [facility] ENDOSCOPY;  Service: Gastroenterology;;  . REVISON OF ARTERIOVENOUS FISTULA Left 2/58/5277   Procedure: PLICATION OF ARTERIOVENOUS FISTULA;  Surgeon: Conrad [place], MD;  Location: [place];  Service: Vascular;  Laterality: Left;  Marland Kitchen VIDEO BRONCHOSCOPY WITH ENDOBRONCHIAL ULTRASOUND N/A 12/19/2018   Procedure: VIDEO BRONCHOSCOPY WITH ENDOBRONCHIAL ULTRASOUND;  Surgeon: Garner Nash, DO;  Location: [place];  Service: Thoracic;  Laterality:  N/A;     reports that she has been smoking cigarettes. She has a 7.50 pack-year smoking history. She has never used smokeless tobacco. She reports previous alcohol use. She reports that she does not use drugs.  No Known Allergies  Family History  Problem Relation Age of Onset  . Esophageal cancer Other   . Pancreatic cancer Other   . Heart disease Mother   . Hypertension Mother   . Breast cancer Sister     . Esophageal cancer Sister   . Cancer Sister   . Deep vein thrombosis Sister   . Diabetes Sister   . Hypertension Sister   . Hypertension Daughter   . Colon cancer Neg Hx     Prior to Admission medications   Medication Sig Start Date End Date Taking? Authorizing Provider  acetaminophen (TYLENOL) 325 MG tablet Take 325-650 mg by mouth every 8 (eight) hours as needed for mild pain.     [provider]  allopurinol (ZYLOPRIM) 100 MG tablet Take 100 mg by mouth daily.      [provider]  Amino Acids-Protein Hydrolys (FEEDING SUPPLEMENT, PRO-STAT SUGAR FREE 64,) LIQD Take 30 mLs by mouth 2 (two) times daily. 10/26/18   Hosie Poisson, MD  aspirin EC 325 MG tablet Take 1 tablet (325 mg total) by mouth daily. Patient not taking: Reported on 01/21/2019 08/27/13   Thurnell Lose, MD  atenolol (TENORMIN) 100 MG tablet Take 100 mg by mouth daily.  08/29/13   [provider]  atorvastatin (LIPITOR) 10 MG tablet Take 10 mg by mouth at bedtime.  01/30/16   [provider]  B Complex-Folic Acid (B COMPLEX FORMULA 1) TABS Take 1 tablet by mouth daily.  07/11/17   [provider]  calcitRIOL (ROCALTROL) 0.5 MCG capsule Take 2 capsules (1 mcg total) by mouth every Monday, Wednesday, and Friday with hemodialysis. 10/28/18   Hosie Poisson, MD  cinacalcet (SENSIPAR) 30 MG tablet Take 1 tablet (30 mg total) by mouth daily with supper. 10/26/18   Hosie Poisson, MD  colchicine 0.6 MG tablet Take 0.6 mg by mouth daily as needed (as directed for gout flares or pain).  08/14/18   [provider]  Darbepoetin Alfa (ARANESP) 60 MCG/0.3ML SOSY injection Inject 0.3 mLs (60 mcg total) into the vein every Friday with hemodialysis. 11/01/18   Hosie Poisson, MD  hydrocerin (EUCERIN) CREA Apply 1 application topically 2 (two) times daily. Patient not taking: Reported on 01/20/2019 10/26/18   Hosie Poisson, MD  ipratropium-albuterol (DUONEB) 0.5-2.5 (3) MG/3ML SOLN Take 3 mLs by  nebulization every 6 (six) hours as needed. Patient not taking: Reported on 01/21/2019 01/15/19   June Leap L, DO  loperamide (IMODIUM) 2 MG capsule Take 2 mg by mouth daily as needed for diarrhea or loose stools.  06/24/18 06/23/19  [provider]  midodrine (PROAMATINE) 10 MG tablet Take 1 tablet (10 mg total) by mouth 2 (two) times daily with a meal. 10/26/18   Hosie Poisson, MD  multivitamin (RENA-VIT) TABS tablet Take 1 tablet by mouth at bedtime. 10/26/18   Hosie Poisson, MD  nicotine (NICODERM CQ - DOSED IN MG/24 HOURS) 14 mg/24hr patch Place 1 patch (14 mg total) onto the skin daily as needed (smoking craving). Patient not taking: Reported on 01/21/2019 10/26/18   Hosie Poisson, MD  ondansetron (ZOFRAN ODT) 4 MG disintegrating tablet 4mg  ODT q4 hours prn nausea/vomit 02/05/19   Drenda Freeze, MD  prochlorperazine (COMPAZINE) 10 MG tablet Take 1  tablet (10 mg total) by mouth every 6 (six) hours as needed for nausea or vomiting. 01/21/19   Heilingoetter, Cassandra L, PA-C  thiamine (VITAMIN B-1) 100 MG tablet Take 100 mg by mouth daily.    [provider]  VELPHORO 500 MG chewable tablet Chew 1,000 mg by mouth 3 (three) times daily with meals.  06/30/16   [provider]  Vitamin D, Cholecalciferol, 25 MCG (1000 UT) TABS Take 1,000 Units by mouth daily.  08/12/18 08/11/19  [provider]    Physical Exam: Constitutional: Moderately built and nourished. Vitals:   02/05/19 2114 02/05/19 2115 02/05/19 2130 02/05/19 2300  BP: 102/64 104/62 116/77 119/80  Pulse: 64 64 75 81  Resp: 14 19 18 15   Temp: 98.2 F (36.8 C)   98.2 F (36.8 C)  TempSrc: Oral     SpO2: 97% 97% 90% 94%   Eyes: Anicteric no pallor. ENMT: No discharge from the ears eyes nose or mouth. Neck: No mass felt.  No neck rigidity. Respiratory: No rhonchi or crepitations. Cardiovascular: S1-S2 heard. Abdomen: Soft mild tenderness in the upper quadrant.  No guarding or  rigidity. Musculoskeletal: No edema. Skin: No rash. Neurologic: Alert awake oriented to her name and follows commands moves all extremities but appears mild confused. Psychiatric: Appears mildly confused.   Labs on Admission: I have personally reviewed following labs and imaging studies  CBC: Recent Labs  Lab 02/04/19 1029 02/05/19 1325 02/05/19 1452 02/05/19 1541  WBC 4.7 6.5  --   --   NEUTROABS 3.2 4.8  --   --   HGB 9.0* 9.0* 9.5* 9.2*  HCT 28.4* 28.1* 28.0* 27.0*  MCV 96.3 95.9  --   --   PLT 137* 148*  --   --    Basic Metabolic Panel: Recent Labs  Lab 02/04/19 1029 02/05/19 1325 02/05/19 1452 02/05/19 1541  NA 140 139 140 139  K 4.1 4.1 4.4 4.2  CL 100 100 100  --   CO2 26 25  --   --   GLUCOSE 83 79 72  --   BUN 29* 39* 49*  --   CREATININE 4.81* 5.84* 5.50*  --   CALCIUM 8.8* 8.7*  --   --    GFR: Estimated Creatinine Clearance: 9.8 mL/min (A) (by C-G formula based on SCr of 5.5 mg/dL (H)). Liver Function Tests: Recent Labs  Lab 02/04/19 1029 02/05/19 1325  AST 15 15  ALT 8 12  ALKPHOS 167* 152*  BILITOT 0.8 0.9  PROT 7.5 7.3  ALBUMIN 2.7* 2.5*   Recent Labs  Lab 02/05/19 1325  LIPASE 24   No results for input(s): AMMONIA in the last 168 hours. Coagulation Profile: No results for input(s): INR, PROTIME in the last 168 hours. Cardiac Enzymes: No results for input(s): CKTOTAL, CKMB, CKMBINDEX, TROPONINI in the last 168 hours. BNP (last 3 results) No results for input(s): PROBNP in the last 8760 hours. HbA1C: No results for input(s): HGBA1C in the last 72 hours. CBG: No results for input(s): GLUCAP in the last 168 hours. Lipid Profile: No results for input(s): CHOL, HDL, LDLCALC, TRIG, CHOLHDL, LDLDIRECT in the last 72 hours. Thyroid Function Tests: No results for input(s): TSH, T4TOTAL, FREET4, T3FREE, THYROIDAB in the last 72 hours. Anemia Panel: No results for input(s): VITAMINB12, FOLATE, FERRITIN, TIBC, IRON, RETICCTPCT in the last  72 hours. Urine analysis:    Component Value Date/Time   COLORURINE YELLOW 08/27/2013 0546   APPEARANCEUR CLOUDY (A) 08/27/2013 6811  LABSPEC 1.026 08/27/2013 0546   PHURINE 7.5 08/27/2013 0546   GLUCOSEU NEGATIVE 08/27/2013 0546   HGBUR TRACE (A) 08/27/2013 0546   BILIRUBINUR SMALL (A) 08/27/2013 0546   KETONESUR NEGATIVE 08/27/2013 0546   PROTEINUR >300 (A) 08/27/2013 0546   UROBILINOGEN 0.2 08/27/2013 0546   NITRITE NEGATIVE 08/27/2013 0546   LEUKOCYTESUR NEGATIVE 08/27/2013 0546   Sepsis Labs: @LABRCNTIP (procalcitonin:4,lacticidven:4) )No results found for this or any previous visit (from the past 240 hour(s)).   Radiological Exams on Admission: DG Chest 2 View  Result Date: 02/05/2019 CLINICAL DATA:  68 year old female with nausea vomiting and pleuritic chest pain. EXAM: CHEST - 2 VIEW COMPARISON:  Portable chest 10/22/2018 and earlier. CT chest 11/12/2018. FINDINGS: Spiculated right upper lobe lung mass demonstrated by CT has been poorly visible radiographically but might have regressed. Right pleural effusion probably persists, small. Stable cardiomegaly and mediastinal contours. No pneumothorax. No definite left pleural effusion. Pulmonary vascularity is unchanged with symmetric vascular congestion suspected. No new confluent opacity. Negative visible bowel gas pattern. No acute osseous abnormality identified. IMPRESSION: 1. Spiculated right upper lobe lung mass has been better demonstrated by CT. 2. Cardiomegaly with continued pulmonary vascular congestion suspicious for mild interstitial edema. Small right pleural effusion suspected. 3. No pneumothorax or other acute cardiopulmonary abnormality. Electronically Signed   By: Genevie Ann M.D.   On: 02/05/2019 13:24   CT Renal Stone Study  Result Date: 02/05/2019 CLINICAL DATA:  Flank pain, generalized weakness EXAM: CT ABDOMEN AND PELVIS WITHOUT CONTRAST TECHNIQUE: Multidetector CT imaging of the abdomen and pelvis was performed  following the standard protocol without IV contrast. COMPARISON:  None. FINDINGS: Lower chest: Small right pleural effusion. No left pleural effusion. Cardiomegaly. Hepatobiliary: No focal hepatic mass. Calcification along the inferior margin of the gallbladder which may reflect a gallstone or volume averaging from the adjacent small bowel. No intrahepatic biliary ductal dilatation. Pancreas: Unremarkable. No pancreatic ductal dilatation or surrounding inflammatory changes. Spleen: Normal in size without focal abnormality. Adrenals/Urinary Tract: Indeterminate 9 mm left adrenal nodule. Severe atrophy of bilateral kidneys. No urolithiasis or obstructive uropathy. Decompressed bladder. Stomach/Bowel: No bowel dilatation to suggest obstruction. Mild small bowel wall thickening in the left side of the abdomen as can be seen with enteritis secondary to an infectious or inflammatory etiology. No pneumatosis, pneumoperitoneum or portal venous gas. Diverticulosis without evidence of diverticulitis. Vascular/Lymphatic: Normal caliber abdominal aorta with mild atherosclerosis. No lymphadenopathy. Reproductive: Status post hysterectomy. No adnexal masses. Other: No fluid collection or hematoma.  Anasarca is present. Musculoskeletal: No acute osseous abnormality. No aggressive osseous lesion. Degenerative disease with disc height loss throughout the lumbar spine. Schmorl's node at L3 and L4. IMPRESSION: 1. Mild small bowel wall thickening in the left side of the abdomen as can be seen with enteritis secondary to an infectious or inflammatory etiology. 2. Generalized anasarca. 3. Small right pleural effusion. 4. Diverticulosis without evidence of diverticulitis. 5. No bowel obstruction. Electronically Signed   By: Kathreen Devoid   On: 02/05/2019 14:51    EKG: Independently reviewed.  Normal sinus rhythm.  Assessment/Plan Principal Problem:   Acute pulmonary edema (HCC) Active Problems:   Hypertension   NSTEMI-08/25/13-  non obstructive CAD at cath 08/26/13   ESRD (end stage renal disease) on dialysis [facility]   Lung mass   Adenocarcinoma of right lung, stage 3 (HCC)   Enteritis   Acute respiratory failure with hypoxia ([place])    1. Acute pulmonary edema likely from fluid overload patient did not have dialysis yesterday  nephrology has been notified.  Will closely observe.  I think patient's respiratory status will improve with dialysis. 2. Abdominal pain with CT scan showing enteritis and chronic diarrhea we will keep patient empirically on Cipro and Flagyl check stool studies. 3. Recently diagnosed non-small cell lung cancer being followed by oncologist plan to have chemoradiation next month. 4. Hypertension on atenolol patient is also on midodrine. 5. Chronic anemia follow CBC.  Has had colonoscopy in September.  Showed diverticulosis. 6. Has some memory issues which patient's daughter states that she also has noticed.  Closely observe. 7. Hyperlipidemia on statins. 8. History of gout on allopurinol.  Given the acute respiratory failure with abdominal pain enteritis will need inpatient status.   DVT prophylaxis: Heparin. Code Status: Full code confirmed with patient's daughter. Family Communication: Patient's daughter. Disposition Plan: Home. Consults called: ER physician discussed with nephrologist. Admission status: Inpatient.   Rise Patience MD [facility] Pager [phone].  If 7PM-7AM, please contact night-coverage [email or web address] Password [facility]  02/05/2019, 11:49 PM

## 2019-02-06 ENCOUNTER — Other Ambulatory Visit: Payer: Self-pay

## 2019-02-06 DIAGNOSIS — C3491 Malignant neoplasm of unspecified part of right bronchus or lung: Secondary | ICD-10-CM

## 2019-02-06 LAB — IRON AND TIBC
Iron: 39 ug/dL (ref 28–170)
Saturation Ratios: 19 % (ref 10.4–31.8)
TIBC: 206 ug/dL — ABNORMAL LOW (ref 250–450)
UIBC: 167 ug/dL

## 2019-02-06 LAB — CBC
HCT: 24.1 % — ABNORMAL LOW (ref 36.0–46.0)
Hemoglobin: 7.9 g/dL — ABNORMAL LOW (ref 12.0–15.0)
MCH: 31.1 pg (ref 26.0–34.0)
MCHC: 32.8 g/dL (ref 30.0–36.0)
MCV: 94.9 fL (ref 80.0–100.0)
Platelets: 148 10*3/uL — ABNORMAL LOW (ref 150–400)
RBC: 2.54 MIL/uL — ABNORMAL LOW (ref 3.87–5.11)
RDW: 16.7 % — ABNORMAL HIGH (ref 11.5–15.5)
WBC: 4.9 10*3/uL (ref 4.0–10.5)
nRBC: 0 % (ref 0.0–0.2)

## 2019-02-06 LAB — BASIC METABOLIC PANEL
Anion gap: 14 (ref 5–15)
BUN: 47 mg/dL — ABNORMAL HIGH (ref 8–23)
CO2: 25 mmol/L (ref 22–32)
Calcium: 8.2 mg/dL — ABNORMAL LOW (ref 8.9–10.3)
Chloride: 102 mmol/L (ref 98–111)
Creatinine, Ser: 6.14 mg/dL — ABNORMAL HIGH (ref 0.44–1.00)
GFR calc Af Amer: 7 mL/min — ABNORMAL LOW (ref >60)
GFR calc non Af Amer: 6 mL/min — ABNORMAL LOW (ref >60)
Glucose, Bld: 97 mg/dL (ref 70–99)
Potassium: 4.3 mmol/L (ref 3.5–5.1)
Sodium: 141 mmol/L (ref 135–145)

## 2019-02-06 LAB — LACTIC ACID, PLASMA
Lactic Acid, Venous: 2.4 mmol/L (ref 0.5–1.9)
Lactic Acid, Venous: 1.6 mmol/L (ref 0.5–1.9)

## 2019-02-06 LAB — SARS CORONAVIRUS 2 (TAT 6-24 HRS): SARS Coronavirus 2: NEGATIVE

## 2019-02-06 LAB — MRSA PCR SCREENING: MRSA by PCR: NEGATIVE

## 2019-02-06 MED ORDER — CALCITRIOL 0.25 MCG PO CAPS: 2.0000 ug | ORAL_CAPSULE | ORAL | Status: DC

## 2019-02-06 MED ORDER — DARBEPOETIN ALFA 150 MCG/0.3ML IJ SOSY
PREFILLED_SYRINGE | INTRAMUSCULAR | Status: AC
  Filled 2019-02-06: qty 0.3

## 2019-02-06 MED ORDER — CHLORHEXIDINE GLUCONATE CLOTH 2 % EX PADS
6.0000 | MEDICATED_PAD | Freq: Every day | CUTANEOUS | Status: DC
  Administered 2019-02-07 – 2019-02-08 (×2): 6 via TOPICAL

## 2019-02-06 MED ORDER — DARBEPOETIN ALFA 150 MCG/0.3ML IJ SOSY
150.0000 ug | PREFILLED_SYRINGE | INTRAMUSCULAR | Status: DC
  Administered 2019-02-06: 150 ug via INTRAVENOUS
  Filled 2019-02-06: qty 0.3

## 2019-02-06 MED ORDER — METRONIDAZOLE IN NACL 5-0.79 MG/ML-% IV SOLN
500.0000 mg | Freq: Three times a day (TID) | INTRAVENOUS | Status: DC
  Administered 2019-02-06 – 2019-02-08 (×8): 500 mg via INTRAVENOUS
  Filled 2019-02-06 (×8): qty 100

## 2019-02-06 MED ORDER — ORAL CARE MOUTH RINSE
15.0000 mL | Freq: Two times a day (BID) | OROMUCOSAL | Status: DC
  Administered 2019-02-06 – 2019-02-08 (×4): 15 mL via OROMUCOSAL

## 2019-02-06 MED ORDER — PROCHLORPERAZINE MALEATE 10 MG PO TABS: 10.0000 mg | ORAL_TABLET | Freq: Four times a day (QID) | ORAL | 1 refills | Status: AC | PRN

## 2019-02-06 MED ORDER — CIPROFLOXACIN IN D5W 400 MG/200ML IV SOLN
400.0000 mg | INTRAVENOUS | Status: DC
  Administered 2019-02-06 – 2019-02-08 (×3): 400 mg via INTRAVENOUS
  Filled 2019-02-06 (×4): qty 200

## 2019-02-06 NOTE — Consult Note (Signed)
Reason for Consult: To manage dialysis and dialysis related needs Referring Physician: Dr Elizebeth Brooking is an 68 y.o. female.  HPI: History of end-stage renal disease Monday Wednesday Friday dialysis was recently diagnosed with non-small cell lung cancer and planning to undergo chemotherapy/radiation treatment next month.  She has a history of hypertension anemia admitted in September 2020 for anemia.  At that time she underwent colonoscopy the and it demonstrated diverticulitis.  She also has a history of E. coli colitis.  She presented to the emergency room with chronic diarrhea over the past 2 to 3 months.  She is also complaining of some diffuse abdominal pain.  CT of abdomen without contrast showed features of enteritis.  CT scan of chest showed pleural effusion chest x-ray pleural effusion and congestion.  She required 4 L nasal cannula.  Blood pressure 107/66 pulse 75 temperature 98.9 O2 sats 94% 4 L nasal cannula  Sodium 139 potassium 4.1 chloride 100 CO2 25 glucose 79 BUN 39 creatinine 5.5 calcium 8.7 albumin 2.5 lipase 24 AST 12 ALT 15 WBC 4.9 hemoglobin 7.9 platelets 148  Dialyzes at [facility] EDW 65 kg HD [place] calcium 2 potassium 3 sodium 137 bicarbonate 35, Dialyzer 180 and hour Optiflex, no heparin.  Prescribed time 4 hours.  Access left AV fistula  Atenolol 100 mg daily allopurinol 100 mg daily atorvastatin 10 mg daily calcitriol 1 mcg Monday Wednesday Friday vitamin D3 1000 units, cinacalcet 30 mg nightly, midodrine 10 mg twice daily, VELPHORO 1 g 3 times daily with meals  Past Medical History:  Diagnosis Date  . Abnormal Pap smear    ASCUS ?HGSIL  . Anemia   . Arthritis   . Cancer ([place])    lung ( per daughter)  . Cervical dysplasia   . CHF (congestive heart failure) ([place])   . Chronic kidney disease   . Constipation   . Constipation   . Dyspnea   . GERD (gastroesophageal reflux disease)   . Gout   . Hyperlipidemia   . Hypertension   .  Myocardial infarction ([place]) 2015  . Obesity   . Pneumonia   . Uterine polyp     Past Surgical History:  Procedure Laterality Date  . AV FISTULA PLACEMENT  01/18/2012   Procedure: ARTERIOVENOUS (AV) FISTULA CREATION;  Surgeon: Angelia Mould, MD;  Location: [place];  Service: Vascular;  Laterality: Left;  . BIOPSY  10/20/2018   Procedure: BIOPSY;  Surgeon: Thornton Park, MD;  Location: [place];  Service: Gastroenterology;;  . COLONOSCOPY WITH PROPOFOL N/A 10/20/2018   Procedure: COLONOSCOPY WITH PROPOFOL;  Surgeon: Thornton Park, MD;  Location: [place];  Service: Gastroenterology;  Laterality: N/A;  . DILATION AND CURETTAGE OF UTERUS    . HYSTEROSCOPY    . LEEP    . LEFT HEART CATHETERIZATION WITH CORONARY ANGIOGRAM N/A 08/26/2013   Procedure: LEFT HEART CATHETERIZATION WITH CORONARY ANGIOGRAM;  Surgeon: Leonie Man, MD;  Location: [facility] CATH LAB;  Service: Cardiovascular;  Laterality: N/A;  . POLYPECTOMY  10/20/2018   Procedure: POLYPECTOMY;  Surgeon: Thornton Park, MD;  Location: [facility] ENDOSCOPY;  Service: Gastroenterology;;  . REVISON OF ARTERIOVENOUS FISTULA Left 05/04/2246   Procedure: PLICATION OF ARTERIOVENOUS FISTULA;  Surgeon: Conrad Aldan, MD;  Location: [place];  Service: Vascular;  Laterality: Left;  Marland Kitchen VIDEO BRONCHOSCOPY WITH ENDOBRONCHIAL ULTRASOUND N/A 12/19/2018   Procedure: VIDEO BRONCHOSCOPY WITH ENDOBRONCHIAL ULTRASOUND;  Surgeon: Garner Nash, DO;  Location: [place];  Service: Thoracic;  Laterality:  N/A;    Family History  Problem Relation Age of Onset  . Esophageal cancer Other   . Pancreatic cancer Other   . Heart disease Mother   . Hypertension Mother   . Breast cancer Sister   . Esophageal cancer Sister   . Cancer Sister   . Deep vein thrombosis Sister   . Diabetes Sister   . Hypertension Sister   . Hypertension Daughter   . Colon cancer Neg Hx     Social History:  reports that she has been smoking cigarettes. She has a 7.50 pack-year  smoking history. She has never used smokeless tobacco. She reports previous alcohol use. She reports that she does not use drugs.  Allergies: No Known Allergies  Medications:  Micera 150 mcg IV push every 2 weeks Calcitriol 2 mcg 3 times weekly with dialysis  Results for orders placed or performed during the hospital encounter of 02/05/19 (from the past 48 hour(s))  CBC with Differential     Status: Abnormal   Collection Time: 02/05/19  1:25 PM  Result Value Ref Range   WBC 6.5 4.0 - 10.5 K/uL   RBC 2.93 (L) 3.87 - 5.11 MIL/uL   Hemoglobin 9.0 (L) 12.0 - 15.0 g/dL   HCT 28.1 (L) 36.0 - 46.0 %   MCV 95.9 80.0 - 100.0 fL   MCH 30.7 26.0 - 34.0 pg   MCHC 32.0 30.0 - 36.0 g/dL   RDW 17.0 (H) 11.5 - 15.5 %   Platelets 148 (L) 150 - 400 K/uL   nRBC 0.0 0.0 - 0.2 %   Neutrophils Relative % 73 %   Neutro Abs 4.8 1.7 - 7.7 K/uL   Lymphocytes Relative 15 %   Lymphs Abs 1.0 0.7 - 4.0 K/uL   Monocytes Relative 10 %   Monocytes Absolute 0.7 0.1 - 1.0 K/uL   Eosinophils Relative 1 %   Eosinophils Absolute 0.1 0.0 - 0.5 K/uL   Basophils Relative 1 %   Basophils Absolute 0.0 0.0 - 0.1 K/uL   Immature Granulocytes 0 %   Abs Immature Granulocytes 0.01 0.00 - 0.07 K/uL    Comment: Performed at [facility], [address] [place], [place] [address]  Comprehensive metabolic panel     Status: Abnormal   Collection Time: 02/05/19  1:25 PM  Result Value Ref Range   Sodium 139 135 - 145 mmol/L   Potassium 4.1 3.5 - 5.1 mmol/L   Chloride 100 98 - 111 mmol/L   CO2 25 22 - 32 mmol/L   Glucose, Bld 79 70 - 99 mg/dL   BUN 39 (H) 8 - 23 mg/dL   Creatinine, Ser 5.84 (H) 0.44 - 1.00 mg/dL   Calcium 8.7 (L) 8.9 - 10.3 mg/dL   Total Protein 7.3 6.5 - 8.1 g/dL   Albumin 2.5 (L) 3.5 - 5.0 g/dL   AST 15 15 - 41 U/L   ALT 12 0 - 44 U/L   Alkaline Phosphatase 152 (H) 38 - 126 U/L   Total Bilirubin 0.9 0.3 - 1.2 mg/dL   GFR calc non Af Amer 7 (L) >60 mL/min   GFR calc Af Amer 8 (L) >60 mL/min    Anion gap 14 5 - 15    Comment: Performed at [facility], [place] [address] [place], [place] [address]  Troponin I (High Sensitivity)     Status: Abnormal   Collection Time: 02/05/19  1:25 PM  Result Value Ref Range   Troponin I (  High Sensitivity) 25 (H) <18 ng/L    Comment: (NOTE) Elevated high sensitivity troponin I (hsTnI) values and significant  changes across serial measurements may suggest ACS but many other  chronic and acute conditions are known to elevate hsTnI results.  Refer to the "Links" section for chest pain algorithms and additional  guidance. Performed at [facility], [place] [address], [place], [place] [address]   Lipase, blood     Status: None   Collection Time: 02/05/19  1:25 PM  Result Value Ref Range   Lipase 24 11 - 51 U/L    Comment: Performed at [facility], [place] [address], [place], [place] [address]  Troponin I (High Sensitivity)     Status: Abnormal   Collection Time: 02/05/19  2:23 PM  Result Value Ref Range   Troponin I (High Sensitivity) 23 (H) <18 ng/L    Comment: (NOTE) Elevated high sensitivity troponin I (hsTnI) values and significant  changes across serial measurements may suggest ACS but many other  chronic and acute conditions are known to elevate hsTnI results.  Refer to the "Links" section for chest pain algorithms and additional  guidance. Performed at [name], [place] [address] [place], [place] [address]   I-stat chem 8, ED (not at [facility] or [facility])     Status: Abnormal   Collection Time: 02/05/19  2:52 PM  Result Value Ref Range   Sodium 140 135 - 145 mmol/L   Potassium 4.4 3.5 - 5.1 mmol/L   Chloride 100 98 - 111 mmol/L   BUN 49 (H) 8 - 23 mg/dL   Creatinine, Ser 5.50 (H) 0.44 - 1.00 mg/dL   Glucose, Bld 72 70 - 99 mg/dL   Calcium, Ion 1.08 (L) 1.15 - 1.40 mmol/L   TCO2 29 22 - 32 mmol/L   Hemoglobin 9.5 (L) 12.0 - 15.0 g/dL   HCT 28.0 (L) 36.0 - 46.0 %  I-STAT 7, (LYTES, BLD GAS, ICA, H+H)     Status:  Abnormal   Collection Time: 02/05/19  3:41 PM  Result Value Ref Range   pH, Arterial 7.450 7.350 - 7.450   pCO2 arterial 39.4 32.0 - 48.0 mmHg   pO2, Arterial 45.0 (L) 83.0 - 108.0 mmHg   Bicarbonate 27.5 20.0 - 28.0 mmol/L   TCO2 29 22 - 32 mmol/L   O2 Saturation 84.0 %   Acid-Base Excess 3.0 (H) 0.0 - 2.0 mmol/L   Sodium 139 135 - 145 mmol/L   Potassium 4.2 3.5 - 5.1 mmol/L   Calcium, Ion 1.15 1.15 - 1.40 mmol/L   HCT 27.0 (L) 36.0 - 46.0 %   Hemoglobin 9.2 (L) 12.0 - 15.0 g/dL   Patient temperature 98.0 F    Sample type ARTERIAL   POC SARS Coronavirus 2 Ag-ED - Nasal Swab (BD Veritor Kit)     Status: None   Collection Time: 02/05/19  5:12 PM  Result Value Ref Range   SARS Coronavirus 2 Ag NEGATIVE NEGATIVE    Comment: (NOTE) SARS-CoV-2 antigen NOT DETECTED.  Negative results are presumptive.  Negative results do not preclude SARS-CoV-2 infection and should not be used as the sole basis for treatment or other patient management decisions, including infection  control decisions, particularly in the presence of clinical signs and  symptoms consistent with COVID-19, or in those who have been in contact with the virus.  Negative results must be combined with clinical observations, patient history, and epidemiological information. The expected result is Negative.  Fact Sheet for Patients: PodPark.tn Fact Sheet for Healthcare Providers: GiftContent.is This test is not yet approved or cleared by the [place] FDA and  has been authorized for detection and/or diagnosis of SARS-CoV-2 by FDA under an Emergency Use Authorization (EUA).  This EUA will remain in effect (meaning this test can be used) for the duration of  the COVID-19 de claration under Section 564(b)(1) of the Act, 21 U.S.C. section 360bbb-3(b)(1), unless the authorization is terminated or revoked sooner.   SARS CORONAVIRUS 2 (TAT 6-24 HRS) Nasopharyngeal  Nasopharyngeal Swab     Status: None   Collection Time: 02/05/19  8:04 PM   Specimen: Nasopharyngeal Swab  Result Value Ref Range   SARS Coronavirus 2 NEGATIVE NEGATIVE    Comment: (NOTE) SARS-CoV-2 target nucleic acids are NOT DETECTED. The SARS-CoV-2 RNA is generally detectable in upper and lower respiratory specimens during the acute phase of infection. Negative results do not preclude SARS-CoV-2 infection, do not rule out co-infections with other pathogens, and should not be used as the sole basis for treatment or other patient management decisions. Negative results must be combined with clinical observations, patient history, and epidemiological information. The expected result is Negative. Fact Sheet for Patients: SugarRoll.be Fact Sheet for Healthcare Providers: https://www.woods-mathews.com/ This test is not yet approved or cleared by the [place] FDA and  has been authorized for detection and/or diagnosis of SARS-CoV-2 by FDA under an Emergency Use Authorization (EUA). This EUA will remain  in effect (meaning this test can be used) for the duration of the COVID-19 declaration under Section 56 4(b)(1) of the Act, 21 U.S.C. section 360bbb-3(b)(1), unless the authorization is terminated or revoked sooner. Performed at [facility], [place] [address], [place], [place] [address]   MRSA PCR Screening     Status: None   Collection Time: 02/06/19 12:52 AM   Specimen: Nasal Mucosa; Nasopharyngeal  Result Value Ref Range   MRSA by PCR NEGATIVE NEGATIVE    Comment:        The GeneXpert MRSA Assay (FDA approved for NASAL specimens only), is one component of a comprehensive MRSA colonization surveillance program. It is not intended to diagnose MRSA infection nor to guide or monitor treatment for MRSA infections. Performed at [facility], [place] [address] [place], [place] [address]   Lactic acid, plasma     Status:  Abnormal   Collection Time: 02/06/19 12:53 AM  Result Value Ref Range   Lactic Acid, Venous 2.4 (HH) 0.5 - 1.9 mmol/L    Comment: CRITICAL RESULT CALLED TO, READ BACK BY AND VERIFIED WITH: RN I MASLENJAK @0210  02/06/19 BY S GEZAHEGN Performed at [facility], [place] [address] [place], [place] [address]   Basic metabolic panel     Status: Abnormal   Collection Time: 02/06/19  3:12 AM  Result Value Ref Range   Sodium 141 135 - 145 mmol/L   Potassium 4.3 3.5 - 5.1 mmol/L   Chloride 102 98 - 111 mmol/L   CO2 25 22 - 32 mmol/L   Glucose, Bld 97 70 - 99 mg/dL   BUN 47 (H) 8 - 23 mg/dL   Creatinine, Ser 6.14 (H) 0.44 - 1.00 mg/dL   Calcium 8.2 (L) 8.9 - 10.3 mg/dL   GFR calc non Af Amer 6 (L) >60 mL/min   GFR calc Af Amer 7 (L) >60 mL/min   Anion gap 14 5 - 15    Comment: Performed at [facility], [address] [place], [place] [address]  CBC     Status: Abnormal   Collection Time: 02/06/19  3:12 AM  Result Value Ref Range   WBC 4.9 4.0 - 10.5 K/uL   RBC 2.54 (L) 3.87 - 5.11 MIL/uL   Hemoglobin 7.9 (L) 12.0 - 15.0 g/dL   HCT 24.1 (L) 36.0 - 46.0 %   MCV 94.9 80.0 - 100.0 fL   MCH 31.1 26.0 - 34.0 pg   MCHC 32.8 30.0 - 36.0 g/dL   RDW 16.7 (H) 11.5 - 15.5 %   Platelets 148 (L) 150 - 400 K/uL   nRBC 0.0 0.0 - 0.2 %    Comment: Performed at [facility], [place] [address] [place], [place] [address]  Lactic acid, plasma     Status: None   Collection Time: 02/06/19  3:12 AM  Result Value Ref Range   Lactic Acid, Venous 1.6 0.5 - 1.9 mmol/L    Comment: Performed at [facility] [address] [place], [place] [address]    DG Chest 2 View  Result Date: 02/05/2019 CLINICAL DATA:  68 year old female with nausea vomiting and pleuritic chest pain. EXAM: CHEST - 2 VIEW COMPARISON:  Portable chest 10/22/2018 and earlier. CT chest 11/12/2018. FINDINGS: Spiculated right upper lobe lung mass demonstrated by CT has been poorly visible radiographically but might have  regressed. Right pleural effusion probably persists, small. Stable cardiomegaly and mediastinal contours. No pneumothorax. No definite left pleural effusion. Pulmonary vascularity is unchanged with symmetric vascular congestion suspected. No new confluent opacity. Negative visible bowel gas pattern. No acute osseous abnormality identified. IMPRESSION: 1. Spiculated right upper lobe lung mass has been better demonstrated by CT. 2. Cardiomegaly with continued pulmonary vascular congestion suspicious for mild interstitial edema. Small right pleural effusion suspected. 3. No pneumothorax or other acute cardiopulmonary abnormality. Electronically Signed   By: Genevie Ann M.D.   On: 02/05/2019 13:24   CT Renal Stone Study  Result Date: 02/05/2019 CLINICAL DATA:  Flank pain, generalized weakness EXAM: CT ABDOMEN AND PELVIS WITHOUT CONTRAST TECHNIQUE: Multidetector CT imaging of the abdomen and pelvis was performed following the standard protocol without IV contrast. COMPARISON:  None. FINDINGS: Lower chest: Small right pleural effusion. No left pleural effusion. Cardiomegaly. Hepatobiliary: No focal hepatic mass. Calcification along the inferior margin of the gallbladder which may reflect a gallstone or volume averaging from the adjacent small bowel. No intrahepatic biliary ductal dilatation. Pancreas: Unremarkable. No pancreatic ductal dilatation or surrounding inflammatory changes. Spleen: Normal in size without focal abnormality. Adrenals/Urinary Tract: Indeterminate 9 mm left adrenal nodule. Severe atrophy of bilateral kidneys. No urolithiasis or obstructive uropathy. Decompressed bladder. Stomach/Bowel: No bowel dilatation to suggest obstruction. Mild small bowel wall thickening in the left side of the abdomen as can be seen with enteritis secondary to an infectious or inflammatory etiology. No pneumatosis, pneumoperitoneum or portal venous gas. Diverticulosis without evidence of diverticulitis. Vascular/Lymphatic:  Normal caliber abdominal aorta with mild atherosclerosis. No lymphadenopathy. Reproductive: Status post hysterectomy. No adnexal masses. Other: No fluid collection or hematoma.  Anasarca is present. Musculoskeletal: No acute osseous abnormality. No aggressive osseous lesion. Degenerative disease with disc height loss throughout the lumbar spine. Schmorl's node at L3 and L4. IMPRESSION: 1. Mild small bowel wall thickening in the left side of the abdomen as can be seen with enteritis secondary to an infectious or inflammatory etiology. 2. Generalized anasarca. 3. Small right pleural effusion. 4. Diverticulosis without evidence of diverticulitis. 5. No bowel obstruction. Electronically Signed  By: Kathreen Devoid   On: 02/05/2019 14:51    ROS:   General fatigue and weakness Eyes no visual loss blurred vision Ears nose mouth throat no hearing loss epistaxis sore throat Cardiovascular no angina no orthopnea no ankle or leg swelling requiring 4 L nasal cannula Respiratory system denies cough wheeze hemoptysis stage III adenocarcinoma of lung Abdominal system abdominal pain no vomiting chronic diarrhea 2 to 3 weeks enteritis per CT scan Urogenital end-stage renal disease Neurologic history of memory loss no stroke seizures Immunologic/oncology history of small cell lung cancer   Blood pressure 107/66, pulse 76, temperature 98.9 F (37.2 C), temperature source Oral, resp. rate 15, height 5' 11"  (1.803 m), weight 68.5 kg, SpO2 95 %.  Physical examination  Frail nondistressed 4 L nasal cannula Head/eyes normocephalic atraumatic pupils round and reactive Ears nose mouth throat external appearance is normal Neck supple no JVP no thyromegaly Cardiovascular regular rate and rhythm systolic murmur 3/6 Respiratory system diminished air entry bases diffuse rales rhonchi Abdominal system nontender bowel sounds present but slightly hypoactive Extremities no cyanosis clubbing or edema AV fistula left with  thrill and bruit Neurologic grossly intact Dermatologic no issues   Assessment/Plan: 1 ESRD dialysis dependent Monday Wednesday Friday [facility] has missed dialysis treatment 02/05/2019 we will plan for dialysis on 02/06/2019. 2.  Hypertension/volume appears to be some volume overload with use of 4 L of oxygen she uses oxygen in center.  We will continue dialysis for volume removal.  Continues on midodrine for blood pressure support.  Will discontinue atenolol at this time. 3. Anemia noted drop in hemoglobin.  Usual hemoglobin above 10.  Would recommend checking iron studies and continuing ESA. 4.  Bones will continue to follow calcium and phosphorus.  Uses calcitriol 2 mcg Monday Wednesday Friday in center. 5.  Respiratory distress secondary to volume overload will improve with dialysis hopefully.  We will continue to follow 6.  Abdominal pain with CT showing enteritis and chronic diarrhea awaiting stool studies appreciate assistance from hospitalist.  Sherril Croon 02/06/2019, 10:12 AM

## 2019-02-06 NOTE — Progress Notes (Signed)
Daughter [name] stopped by to visit mother. The patient is currently off the floor and in HD. Patient's daughter took some of her mother's belonging. Dark blue purse and black duffel bag. Will tell patient when she comes back from HD.

## 2019-02-06 NOTE — Progress Notes (Signed)
Pharmacy Antibiotic Note  Jessica Clarke is a 68 y.o. female admitted on 02/05/2019 with intra-abdominal infection.  Pharmacy has been consulted for Cipro dosing.  Plan: Cipro 400mg  IV Q24H. Also started on Flagyl appropriately per MD.  Height: 5\' 11"  (180.3 cm) Weight: 151 lb 0.2 oz (68.5 kg) IBW/kg (Calculated) : 70.8  Temp (24hrs), Avg:98.3 F (36.8 C), Min:97.9 F (36.6 C), Max:98.6 F (37 C)  Recent Labs  Lab 02/04/19 1029 02/05/19 1325 02/05/19 1452 02/06/19 0053 02/06/19 0312  WBC 4.7 6.5  --   --  4.9  CREATININE 4.81* 5.84* 5.50*  --  6.14*  LATICACIDVEN  --   --   --  2.4* 1.6    Estimated Creatinine Clearance: 9.2 mL/min (A) (by C-G formula based on SCr of 6.14 mg/dL (H)).    No Known Allergies   Thank you for allowing pharmacy to be a part of this patient's care.  Wynona Neat, PharmD, BCPS  02/06/2019 5:26 AM

## 2019-02-06 NOTE — Progress Notes (Signed)
CRITICAL VALUE ALERT  Critical Value:  Lactic acid 2.4  Date & Time Notied:  02/06/19 at 0211  Provider Notified: Kennon Holter, NP at [place]  Orders Received/Actions taken: No new orders at this time Pt asymptomatic, no distress noted, VS stable.

## 2019-02-06 NOTE — Progress Notes (Signed)
PROGRESS NOTE    Jessica Clarke  HYQ:[id number] DOB: [birth year] DOA: 02/05/2019 PCP: Arnette Felts, FNP    Brief Narrative:68 y.o. female with history of ESRD on hemodialysis on Monday Wednesday Friday, recently diagnosed non-small cell lung cancer planning to have chemo/radiation next month, hypertension, anemia who was admitted in September 2020 for anemia at that time colonoscopy was showing only diverticulosis and also had E. coli colitis for which patient was treated with Cipro at that same admission was brought to the ER from the dialysis center after patient was not feeling well.  Per daughter patient has been having chronic diarrhea for last 2 to 3 months with at times having vomiting.  Has been having abdominal pain mostly in the upper quadrants.  Yesterday patient was transferred before dialysis.  ED Course: In the ER patient had labs drawn which showed alkaline phosphatase of 152 albumin 2.5 lipase 24 AST ALT were normal.  High sensitive troponin were 25 and 23.  EKG shows normal sinus rhythm.  CT of the abdomen done without contrast showed features concerning for enteritis.  CT also showed pleural effusion.  Chest x-ray showed pleural effusion and congestion.  COVID-19 was negative.  Patient required 4 L oxygen at this time and nephrology was notified.  Patient admitted for abdominal pain with enteritis and chronic diarrhea with acute respiratory failure likely from fluid overload.   Assessment & Plan:   Principal Problem:   Acute pulmonary edema (HCC) Active Problems:   Hypertension   NSTEMI-08/25/13- non obstructive CAD at cath 08/26/13   ESRD (end stage renal disease) on dialysis [facility])   Lung mass   Adenocarcinoma of right lung, stage 3 (HCC)   Enteritis   Acute respiratory failure with hypoxia (HCC)  #1 acute pulmonary edema-patient missed 1 dialysis session.  Nephrology consulted for dialysis.  #2 non-small cell lung cancer-followed by oncology  #3 hypertension on  atenolol midodrine for blood pressure support during dialysis.  #5 anemia of chronic renal disease iron studies pending hemoglobin at baseline 10 or above.  Epogen per renal.  #6 hyperlipidemia on statin  #7 gout on allopurinol  #8 abdominal pain exam of admission CT scan of the abdomen shows enteritis patient complaining of diarrhea on Cipro and Flagyl.  Estimated body mass index is 20.82 kg/m as calculated from the following:   Height as of this encounter: 5\' 11"  (1.803 m).   Weight as of this encounter: 67.7 kg.  Subjective: Patient seen before hd.missed one hd session.c/o sob  Objective: Vitals:   02/06/19 1530 02/06/19 1545 02/06/19 1600 02/06/19 1615  BP: (!) 97/49 (!) 93/50 (!) 94/45 (!) 95/49  Pulse: 63 64 60 61  Resp:      Temp:      TempSrc:      SpO2:      Weight:      Height:        Intake/Output Summary (Last 24 hours) at 02/06/2019 1621 Last data filed at 02/06/2019 9528 Gross per 24 hour  Intake 716.67 ml  Output --  Net 716.67 ml   Filed Weights   02/06/19 0005 02/06/19 0329 02/06/19 1155  Weight: 65 kg 68.5 kg 67.7 kg    Examination:  General exam: Appears calm and comfortable  Respiratory system: coarse breath sounds to auscultation. Respiratory effort normal. Cardiovascular system: S1 & S2 heard, RRR. No JVD, murmurs, rubs, gallops or clicks. No pedal edema. Gastrointestinal system: Abdomen is nondistended, soft and nontender. No organomegaly or masses felt. Normal bowel sounds  heard. Central nervous system: Alert and oriented. No focal neurological deficits. Extremities: Symmetric 5 x 5 power. Skin: No rashes, lesions or ulcers Psychiatry: Judgement and insight appear normal. Mood & affect appropriate.     Data Reviewed: I have personally reviewed following labs and imaging studies  CBC: Recent Labs  Lab 02/04/19 1029 02/05/19 1325 02/05/19 1452 02/05/19 1541 02/06/19 0312  WBC 4.7 6.5  --   --  4.9  NEUTROABS 3.2 4.8  --   --    --   HGB 9.0* 9.0* 9.5* 9.2* 7.9*  HCT 28.4* 28.1* 28.0* 27.0* 24.1*  MCV 96.3 95.9  --   --  94.9  PLT 137* 148*  --   --  148*   Basic Metabolic Panel: Recent Labs  Lab 02/04/19 1029 02/05/19 1325 02/05/19 1452 02/05/19 1541 02/06/19 0312  NA 140 139 140 139 141  K 4.1 4.1 4.4 4.2 4.3  CL 100 100 100  --  102  CO2 26 25  --   --  25  GLUCOSE 83 79 72  --  97  BUN 29* 39* 49*  --  47*  CREATININE 4.81* 5.84* 5.50*  --  6.14*  CALCIUM 8.8* 8.7*  --   --  8.2*   GFR: Estimated Creatinine Clearance: 9.1 mL/min (A) (by C-G formula based on SCr of 6.14 mg/dL (H)). Liver Function Tests: Recent Labs  Lab 02/04/19 1029 02/05/19 1325  AST 15 15  ALT 8 12  ALKPHOS 167* 152*  BILITOT 0.8 0.9  PROT 7.5 7.3  ALBUMIN 2.7* 2.5*   Recent Labs  Lab 02/05/19 1325  LIPASE 24   No results for input(s): AMMONIA in the last 168 hours. Coagulation Profile: No results for input(s): INR, PROTIME in the last 168 hours. Cardiac Enzymes: No results for input(s): CKTOTAL, CKMB, CKMBINDEX, TROPONINI in the last 168 hours. BNP (last 3 results) No results for input(s): PROBNP in the last 8760 hours. HbA1C: No results for input(s): HGBA1C in the last 72 hours. CBG: No results for input(s): GLUCAP in the last 168 hours. Lipid Profile: No results for input(s): CHOL, HDL, LDLCALC, TRIG, CHOLHDL, LDLDIRECT in the last 72 hours. Thyroid Function Tests: No results for input(s): TSH, T4TOTAL, FREET4, T3FREE, THYROIDAB in the last 72 hours. Anemia Panel: Recent Labs    02/06/19 1442  TIBC 206*  IRON 39   Sepsis Labs: Recent Labs  Lab 02/06/19 0053 02/06/19 0312  LATICACIDVEN 2.4* 1.6    Recent Results (from the past 240 hour(s))  SARS CORONAVIRUS 2 (TAT 6-24 HRS) Nasopharyngeal Nasopharyngeal Swab     Status: None   Collection Time: 02/05/19  8:04 PM   Specimen: Nasopharyngeal Swab  Result Value Ref Range Status   SARS Coronavirus 2 NEGATIVE NEGATIVE Final    Comment:  (NOTE) SARS-CoV-2 target nucleic acids are NOT DETECTED. The SARS-CoV-2 RNA is generally detectable in upper and lower respiratory specimens during the acute phase of infection. Negative results do not preclude SARS-CoV-2 infection, do not rule out co-infections with other pathogens, and should not be used as the sole basis for treatment or other patient management decisions. Negative results must be combined with clinical observations, patient history, and epidemiological information. The expected result is Negative. Fact Sheet for Patients: HairSlick.no Fact Sheet for Healthcare Providers: quierodirigir.com This test is not yet approved or cleared by the [place] FDA and  has been authorized for detection and/or diagnosis of SARS-CoV-2 by FDA under an Emergency Use Authorization (EUA). This EUA  will remain  in effect (meaning this test can be used) for the duration of the COVID-19 declaration under Section 56 4(b)(1) of the Act, 21 U.S.C. section 360bbb-3(b)(1), unless the authorization is terminated or revoked sooner. Performed at [facility], [address] [place], [place] [address]   MRSA PCR Screening     Status: None   Collection Time: 02/06/19 12:52 AM   Specimen: Nasal Mucosa; Nasopharyngeal  Result Value Ref Range Status   MRSA by PCR NEGATIVE NEGATIVE Final    Comment:        The GeneXpert MRSA Assay (FDA approved for NASAL specimens only), is one component of a comprehensive MRSA colonization surveillance program. It is not intended to diagnose MRSA infection nor to guide or monitor treatment for MRSA infections. Performed at [facility], [address], [place], [place] [address]          Radiology Studies: DG Chest 2 View  Result Date: 02/05/2019 CLINICAL DATA:  68 year old female with nausea vomiting and pleuritic chest pain. EXAM: CHEST - 2 VIEW COMPARISON:  Portable chest  10/22/2018 and earlier. CT chest 11/12/2018. FINDINGS: Spiculated right upper lobe lung mass demonstrated by CT has been poorly visible radiographically but might have regressed. Right pleural effusion probably persists, small. Stable cardiomegaly and mediastinal contours. No pneumothorax. No definite left pleural effusion. Pulmonary vascularity is unchanged with symmetric vascular congestion suspected. No new confluent opacity. Negative visible bowel gas pattern. No acute osseous abnormality identified. IMPRESSION: 1. Spiculated right upper lobe lung mass has been better demonstrated by CT. 2. Cardiomegaly with continued pulmonary vascular congestion suspicious for mild interstitial edema. Small right pleural effusion suspected. 3. No pneumothorax or other acute cardiopulmonary abnormality. Electronically Signed   By: Odessa Fleming M.D.   On: 02/05/2019 13:24   CT Renal Stone Study  Result Date: 02/05/2019 CLINICAL DATA:  Flank pain, generalized weakness EXAM: CT ABDOMEN AND PELVIS WITHOUT CONTRAST TECHNIQUE: Multidetector CT imaging of the abdomen and pelvis was performed following the standard protocol without IV contrast. COMPARISON:  None. FINDINGS: Lower chest: Small right pleural effusion. No left pleural effusion. Cardiomegaly. Hepatobiliary: No focal hepatic mass. Calcification along the inferior margin of the gallbladder which may reflect a gallstone or volume averaging from the adjacent small bowel. No intrahepatic biliary ductal dilatation. Pancreas: Unremarkable. No pancreatic ductal dilatation or surrounding inflammatory changes. Spleen: Normal in size without focal abnormality. Adrenals/Urinary Tract: Indeterminate 9 mm left adrenal nodule. Severe atrophy of bilateral kidneys. No urolithiasis or obstructive uropathy. Decompressed bladder. Stomach/Bowel: No bowel dilatation to suggest obstruction. Mild small bowel wall thickening in the left side of the abdomen as can be seen with enteritis secondary to  an infectious or inflammatory etiology. No pneumatosis, pneumoperitoneum or portal venous gas. Diverticulosis without evidence of diverticulitis. Vascular/Lymphatic: Normal caliber abdominal aorta with mild atherosclerosis. No lymphadenopathy. Reproductive: Status post hysterectomy. No adnexal masses. Other: No fluid collection or hematoma.  Anasarca is present. Musculoskeletal: No acute osseous abnormality. No aggressive osseous lesion. Degenerative disease with disc height loss throughout the lumbar spine. Schmorl's node at L3 and L4. IMPRESSION: 1. Mild small bowel wall thickening in the left side of the abdomen as can be seen with enteritis secondary to an infectious or inflammatory etiology. 2. Generalized anasarca. 3. Small right pleural effusion. 4. Diverticulosis without evidence of diverticulitis. 5. No bowel obstruction. Electronically Signed   By: Elige Ko   On: 02/05/2019 14:51        Scheduled Meds: . allopurinol  100 mg Oral Daily  . atorvastatin  10 mg Oral QHS  . [START ON 02/08/2019] calcitRIOL  2 mcg Oral Q M,W,F-HD  . Chlorhexidine Gluconate Cloth  6 each Topical Q0600  . cholecalciferol  1,000 Units Oral Daily  . cinacalcet  30 mg Oral Q supper  . Darbepoetin Alfa      . darbepoetin (ARANESP) injection - DIALYSIS  150 mcg Intravenous Q Thu-HD  . feeding supplement (PRO-STAT SUGAR FREE 64)  30 mL Oral BID  . heparin  5,000 Units Subcutaneous Q8H  . mouth rinse  15 mL Mouth Rinse BID  . midodrine  10 mg Oral BID WC  . multivitamin  1 tablet Oral QHS  . sucroferric oxyhydroxide  1,000 mg Oral TID WC  . thiamine  100 mg Oral Daily   Continuous Infusions: . ciprofloxacin 400 mg (02/06/19 0649)  . metronidazole 500 mg (02/06/19 0538)     LOS: 1 day     Alwyn Ren, MD [facility]  If 7PM-7AM, please contact night-coverage [email or web address] Password [email or web address] 02/06/2019, 4:21 PM

## 2019-02-07 LAB — HEPATITIS B E ANTIGEN: Hep B E Ag: NEGATIVE

## 2019-02-07 LAB — FERRITIN: Ferritin: 513 ng/mL — ABNORMAL HIGH (ref 11–307)

## 2019-02-07 NOTE — Progress Notes (Signed)
PROGRESS NOTE    Jessica Clarke  GMW:[id number] DOB: 1950-08-10 DOA: 02/05/2019 PCP: Arnette Felts, FNP  Brief Narrative: :68 y.o.femalewithhistory of ESRD on hemodialysis on Monday Wednesday Friday, recently diagnosed non-small cell lung cancer planning to have chemo/radiation next month, hypertension, anemia who was admitted in September 2020 for anemia at that time colonoscopy was showing only diverticulosis and also had E. coli colitis for which patient was treated with Cipro at that same admission was brought to the ER from the dialysis center after patient was not feeling well. Per daughter patient has been having chronic diarrhea for last 2 to 3 months with at times having vomiting. Has been having abdominal pain mostly in the upper quadrants. Yesterday patient was transferred before dialysis.  ED Course:In the ER patient had labs drawn which showed alkaline phosphatase of 152 albumin 2.5 lipase 24 AST ALT were normal. High sensitive troponin were 25 and 23. EKG shows normal sinus rhythm. CT of the abdomen done without contrast showed features concerning for enteritis. CT also showed pleural effusion. Chest x-ray showed pleural effusion and congestion. COVID-19 was negative. Patient required 4 L oxygen at this time and nephrology was notified. Patient admitted for abdominal pain with enteritis and chronic diarrhea with acute respiratory failure likely from fluid overload. Assessment & Plan:   Principal Problem:   Acute pulmonary edema (HCC) Active Problems:   Hypertension   NSTEMI-08/25/13- non obstructive CAD at cath 08/26/13   ESRD (end stage renal disease) on dialysis [facility])   Lung mass   Adenocarcinoma of right lung, stage 3 (HCC)   Enteritis   Acute respiratory failure with hypoxia (HCC)  #1  Acute hypoxic respiratory failure secondary to acute pulmonary edema-patient missed 1 dialysis session.  Patient had 1 dialysis session yesterday.   Nephrology planning for  another session tomorrow.  Her usual days are Monday Wednesday and Friday.  She is a newly diagnosed  non-small cell lung cancer.  Her saturation is 93% on 3 L of oxygen.  Patient does not have oxygen prior to admission to hospital.  #2 non-small cell lung cancer-followed by oncology  #3 hypertension blood pressure soft 100/56.  DC atenolol.  Continue midodrine 10 mg twice a day.  For blood pressure support during dialysis.    #5 anemia of chronic renal disease- iron studies pending hemoglobin at baseline 10 or above.  Aranesp per renal.  Possible iron transfusion per renal.  #6 hyperlipidemia on statin  #7 gout on allopurinol  #8 abdominal pain exam of admission CT scan of the abdomen shows enteritis patient complaining of diarrhea on Cipro and Flagyl.  She has chronic diarrhea I doubt she has enteritis.  Her abdomen is soft and nontender.   Estimated body mass index is 20.57 kg/m as calculated from the following:   Height as of this encounter: 5\' 11"  (1.803 m).   Weight as of this encounter: 66.9 kg.  DVT prophylaxis Heparin CODE STATUS full code Family communication discussed with patient's daughter Disposition hopefully home tomorrow after dialysis Consultation nephrology  Subjective:  Patient is resting in bed she feels her breathing is better but not back to baseline.  Has chronic diarrhea denies abdominal pain. Objective: Vitals:   02/07/19 0358 02/07/19 0500 02/07/19 0653 02/07/19 0817  BP:   (!) 102/57 (!) 100/56  Pulse: 61  60   Resp: 12  (!) 22   Temp:    98.1 F (36.7 C)  TempSrc:    Oral  SpO2: 93%  93%  Weight:  66.9 kg    Height:        Intake/Output Summary (Last 24 hours) at 02/07/2019 1322 Last data filed at 02/07/2019 7829 Gross per 24 hour  Intake 833.33 ml  Output 2200 ml  Net -1366.67 ml   Filed Weights   02/06/19 1155 02/06/19 1621 02/07/19 0500  Weight: 67.7 kg 65.8 kg 66.9 kg    Examination:  General exam: Appears calm and  comfortable  Respiratory system: Coarse breath sounds to auscultation. Respiratory effort normal. Cardiovascular system: S1 & S2 heard, RRR. No JVD, murmurs, rubs, gallops or clicks. No pedal edema. Gastrointestinal system: Abdomen is nondistended, soft and nontender. No organomegaly or masses felt. Normal bowel sounds heard. Central nervous system: Alert and oriented. No focal neurological deficits. Extremities: Symmetric 5 x 5 power. Skin: No rashes, lesions or ulcers Psychiatry: Judgement and insight appear normal. Mood & affect appropriate.     Data Reviewed: I have personally reviewed following labs and imaging studies  CBC: Recent Labs  Lab 02/04/19 1029 02/05/19 1325 02/05/19 1452 02/05/19 1541 02/06/19 0312  WBC 4.7 6.5  --   --  4.9  NEUTROABS 3.2 4.8  --   --   --   HGB 9.0* 9.0* 9.5* 9.2* 7.9*  HCT 28.4* 28.1* 28.0* 27.0* 24.1*  MCV 96.3 95.9  --   --  94.9  PLT 137* 148*  --   --  148*   Basic Metabolic Panel: Recent Labs  Lab 02/04/19 1029 02/05/19 1325 02/05/19 1452 02/05/19 1541 02/06/19 0312  NA 140 139 140 139 141  K 4.1 4.1 4.4 4.2 4.3  CL 100 100 100  --  102  CO2 26 25  --   --  25  GLUCOSE 83 79 72  --  97  BUN 29* 39* 49*  --  47*  CREATININE 4.81* 5.84* 5.50*  --  6.14*  CALCIUM 8.8* 8.7*  --   --  8.2*   GFR: Estimated Creatinine Clearance: 9 mL/min (A) (by C-G formula based on SCr of 6.14 mg/dL (H)). Liver Function Tests: Recent Labs  Lab 02/04/19 1029 02/05/19 1325  AST 15 15  ALT 8 12  ALKPHOS 167* 152*  BILITOT 0.8 0.9  PROT 7.5 7.3  ALBUMIN 2.7* 2.5*   Recent Labs  Lab 02/05/19 1325  LIPASE 24   No results for input(s): AMMONIA in the last 168 hours. Coagulation Profile: No results for input(s): INR, PROTIME in the last 168 hours. Cardiac Enzymes: No results for input(s): CKTOTAL, CKMB, CKMBINDEX, TROPONINI in the last 168 hours. BNP (last 3 results) No results for input(s): PROBNP in the last 8760 hours. HbA1C: No  results for input(s): HGBA1C in the last 72 hours. CBG: No results for input(s): GLUCAP in the last 168 hours. Lipid Profile: No results for input(s): CHOL, HDL, LDLCALC, TRIG, CHOLHDL, LDLDIRECT in the last 72 hours. Thyroid Function Tests: No results for input(s): TSH, T4TOTAL, FREET4, T3FREE, THYROIDAB in the last 72 hours. Anemia Panel: Recent Labs    02/06/19 1442  TIBC 206*  IRON 39   Sepsis Labs: Recent Labs  Lab 02/06/19 0053 02/06/19 0312  LATICACIDVEN 2.4* 1.6    Recent Results (from the past 240 hour(s))  SARS CORONAVIRUS 2 (TAT 6-24 HRS) Nasopharyngeal Nasopharyngeal Swab     Status: None   Collection Time: 02/05/19  8:04 PM   Specimen: Nasopharyngeal Swab  Result Value Ref Range Status   SARS Coronavirus 2 NEGATIVE NEGATIVE Final    Comment: (  NOTE) SARS-CoV-2 target nucleic acids are NOT DETECTED. The SARS-CoV-2 RNA is generally detectable in upper and lower respiratory specimens during the acute phase of infection. Negative results do not preclude SARS-CoV-2 infection, do not rule out co-infections with other pathogens, and should not be used as the sole basis for treatment or other patient management decisions. Negative results must be combined with clinical observations, patient history, and epidemiological information. The expected result is Negative. Fact Sheet for Patients: HairSlick.no Fact Sheet for Healthcare Providers: quierodirigir.com This test is not yet approved or cleared by the [place] FDA and  has been authorized for detection and/or diagnosis of SARS-CoV-2 by FDA under an Emergency Use Authorization (EUA). This EUA will remain  in effect (meaning this test can be used) for the duration of the COVID-19 declaration under Section 56 4(b)(1) of the Act, 21 U.S.C. section 360bbb-3(b)(1), unless the authorization is terminated or revoked sooner. Performed at [facility],  [address] [place], [place] [address]   MRSA PCR Screening     Status: None   Collection Time: 02/06/19 12:52 AM   Specimen: Nasal Mucosa; Nasopharyngeal  Result Value Ref Range Status   MRSA by PCR NEGATIVE NEGATIVE Final    Comment:        The GeneXpert MRSA Assay (FDA approved for NASAL specimens only), is one component of a comprehensive MRSA colonization surveillance program. It is not intended to diagnose MRSA infection nor to guide or monitor treatment for MRSA infections. Performed at [facility], [address] [place], [place] [address]          Radiology Studies: CT Renal Stone Study  Result Date: 02/05/2019 CLINICAL DATA:  Flank pain, generalized weakness EXAM: CT ABDOMEN AND PELVIS WITHOUT CONTRAST TECHNIQUE: Multidetector CT imaging of the abdomen and pelvis was performed following the standard protocol without IV contrast. COMPARISON:  None. FINDINGS: Lower chest: Small right pleural effusion. No left pleural effusion. Cardiomegaly. Hepatobiliary: No focal hepatic mass. Calcification along the inferior margin of the gallbladder which may reflect a gallstone or volume averaging from the adjacent small bowel. No intrahepatic biliary ductal dilatation. Pancreas: Unremarkable. No pancreatic ductal dilatation or surrounding inflammatory changes. Spleen: Normal in size without focal abnormality. Adrenals/Urinary Tract: Indeterminate 9 mm left adrenal nodule. Severe atrophy of bilateral kidneys. No urolithiasis or obstructive uropathy. Decompressed bladder. Stomach/Bowel: No bowel dilatation to suggest obstruction. Mild small bowel wall thickening in the left side of the abdomen as can be seen with enteritis secondary to an infectious or inflammatory etiology. No pneumatosis, pneumoperitoneum or portal venous gas. Diverticulosis without evidence of diverticulitis. Vascular/Lymphatic: Normal caliber abdominal aorta with mild atherosclerosis. No lymphadenopathy.  Reproductive: Status post hysterectomy. No adnexal masses. Other: No fluid collection or hematoma.  Anasarca is present. Musculoskeletal: No acute osseous abnormality. No aggressive osseous lesion. Degenerative disease with disc height loss throughout the lumbar spine. Schmorl's node at L3 and L4. IMPRESSION: 1. Mild small bowel wall thickening in the left side of the abdomen as can be seen with enteritis secondary to an infectious or inflammatory etiology. 2. Generalized anasarca. 3. Small right pleural effusion. 4. Diverticulosis without evidence of diverticulitis. 5. No bowel obstruction. Electronically Signed   By: Elige Ko   On: 02/05/2019 14:51        Scheduled Meds: . allopurinol  100 mg Oral Daily  . atorvastatin  10 mg Oral QHS  . [START ON 02/08/2019] calcitRIOL  2 mcg Oral Q M,W,F-HD  . Chlorhexidine Gluconate Cloth  6 each Topical Q0600  . cholecalciferol  1,000 Units Oral Daily  . cinacalcet  30 mg Oral Q supper  . darbepoetin (ARANESP) injection - DIALYSIS  150 mcg Intravenous Q Thu-HD  . feeding supplement (PRO-STAT SUGAR FREE 64)  30 mL Oral BID  . heparin  5,000 Units Subcutaneous Q8H  . mouth rinse  15 mL Mouth Rinse BID  . midodrine  10 mg Oral BID WC  . multivitamin  1 tablet Oral QHS  . sucroferric oxyhydroxide  1,000 mg Oral TID WC  . thiamine  100 mg Oral Daily   Continuous Infusions: . ciprofloxacin 400 mg (02/07/19 0650)  . metronidazole 500 mg (02/07/19 0532)     LOS: 2 days     Alwyn Ren, MD [facility]  If 7PM-7AM, please contact night-coverage [email or web address] Password [email or web address] 02/07/2019, 1:22 PM

## 2019-02-07 NOTE — Progress Notes (Addendum)
Patient ID: Jessica Clarke, female   DOB: [birth year], 68 y.o.   MRN: [id number]  [facility] Progress Note   Assessment/ Plan:   1.  Hypoxic respiratory failure with acute pulmonary edema: Suspected to be likely from fluid overload from missed hemodialysis in the setting of new non-small cell lung cancer diagnosis (plans for chemotherapy and radiation treatments to begin next month).  Underwent hemodialysis yesterday but remains borderline hypoxic on oxygen via nasal cannula-we will order for additional hemodialysis again tomorrow for continued efforts at volume unloading if she is still here in the hospital. 2. ESRD: Underwent hemodialysis yesterday for pulmonary edema; usually on a Monday/Wednesday/Friday schedule with next hemodialysis planned for tomorrow to accommodate for holiday scheduling if she is still here in the hospital. 3. Anemia: Acute hemoglobin drop noted without overt loss in the setting of low iron saturation, will redose intravenous iron after confirming ferritin level.  On high-dose Aranesp. 4. CKD-MBD: Continue Velphoro for phosphorus binding (will recheck labs again to monitor phosphorus trend) along with calcitriol/cinacalcet for PTH suppression. 5. Nutrition: Continue renal diet with ongoing protein supplementation.  She has abdominal pain and chronic diarrhea raising concerns for enteritis for which she is on IV metronidazole. 6. Hypertension: Blood pressure acceptable on midodrine 10 mg twice a day.  Subjective:   She reports no further abdominal pain/diarrhea and remarks that her shortness of breath has resolved.  "They need to let me go home today".   Objective:   BP (!) 102/57   Pulse 60   Temp 98.5 F (36.9 C) (Oral)   Resp (!) 22   Ht 5\' 11"  (1.803 m)   Wt 66.9 kg   SpO2 93%   BMI 20.57 kg/m   Physical Exam: Gen: Appears comfortable resting in bed, on oxygen via nasal cannula CVS: Pulse regular rhythm, normal rate, S1 and S2 normal Resp:  Coarse breath sounds bilaterally with fine rales right base.  No wheeze Abd: Soft, flat, nontender Ext: No lower extremity edema.  Left upper arm arteriovenous fistula pulsatile.  Labs: BMET Recent Labs  Lab 02/04/19 1029 02/05/19 1325 02/05/19 1452 02/05/19 1541 02/06/19 0312  NA 140 139 140 139 141  K 4.1 4.1 4.4 4.2 4.3  CL 100 100 100  --  102  CO2 26 25  --   --  25  GLUCOSE 83 79 72  --  97  BUN 29* 39* 49*  --  47*  CREATININE 4.81* 5.84* 5.50*  --  6.14*  CALCIUM 8.8* 8.7*  --   --  8.2*   CBC Recent Labs  Lab 02/04/19 1029 02/05/19 1325 02/05/19 1452 02/05/19 1541 02/06/19 0312  WBC 4.7 6.5  --   --  4.9  NEUTROABS 3.2 4.8  --   --   --   HGB 9.0* 9.0* 9.5* 9.2* 7.9*  HCT 28.4* 28.1* 28.0* 27.0* 24.1*  MCV 96.3 95.9  --   --  94.9  PLT 137* 148*  --   --  148*      Medications:    . allopurinol  100 mg Oral Daily  . atorvastatin  10 mg Oral QHS  . [START ON 02/08/2019] calcitRIOL  2 mcg Oral Q M,W,F-HD  . Chlorhexidine Gluconate Cloth  6 each Topical Q0600  . cholecalciferol  1,000 Units Oral Daily  . cinacalcet  30 mg Oral Q supper  . darbepoetin (ARANESP) injection - DIALYSIS  150 mcg Intravenous Q Thu-HD  . feeding supplement (PRO-STAT SUGAR FREE 64)  30 mL Oral BID  . heparin  5,000 Units Subcutaneous Q8H  . mouth rinse  15 mL Mouth Rinse BID  . midodrine  10 mg Oral BID WC  . multivitamin  1 tablet Oral QHS  . sucroferric oxyhydroxide  1,000 mg Oral TID WC  . thiamine  100 mg Oral Daily   Elmarie Shiley, MD 02/07/2019, 8:07 AM

## 2019-02-08 DIAGNOSIS — N186 End stage renal disease: Secondary | ICD-10-CM | POA: Diagnosis not present

## 2019-02-08 DIAGNOSIS — Z992 Dependence on renal dialysis: Secondary | ICD-10-CM | POA: Diagnosis not present

## 2019-02-08 DIAGNOSIS — J810 Acute pulmonary edema: Secondary | ICD-10-CM | POA: Diagnosis not present

## 2019-02-08 DIAGNOSIS — I120 Hypertensive chronic kidney disease with stage 5 chronic kidney disease or end stage renal disease: Secondary | ICD-10-CM | POA: Diagnosis not present

## 2019-02-08 LAB — RENAL FUNCTION PANEL
Albumin: 2.2 g/dL — ABNORMAL LOW (ref 3.5–5.0)
Anion gap: 12 (ref 5–15)
BUN: 28 mg/dL — ABNORMAL HIGH (ref 8–23)
CO2: 24 mmol/L (ref 22–32)
Calcium: 7.4 mg/dL — ABNORMAL LOW (ref 8.9–10.3)
Chloride: 93 mmol/L — ABNORMAL LOW (ref 98–111)
Creatinine, Ser: 4.9 mg/dL — ABNORMAL HIGH (ref 0.44–1.00)
GFR calc Af Amer: 10 mL/min — ABNORMAL LOW (ref >60)
GFR calc non Af Amer: 8 mL/min — ABNORMAL LOW (ref >60)
Glucose, Bld: 79 mg/dL (ref 70–99)
Phosphorus: 4.6 mg/dL (ref 2.5–4.6)
Potassium: 3.7 mmol/L (ref 3.5–5.1)
Sodium: 129 mmol/L — ABNORMAL LOW (ref 135–145)

## 2019-02-08 LAB — CBC
HCT: 24.8 % — ABNORMAL LOW (ref 36.0–46.0)
Hemoglobin: 7.9 g/dL — ABNORMAL LOW (ref 12.0–15.0)
MCH: 30.5 pg (ref 26.0–34.0)
MCHC: 31.9 g/dL (ref 30.0–36.0)
MCV: 95.8 fL (ref 80.0–100.0)
Platelets: 155 10*3/uL (ref 150–400)
RBC: 2.59 MIL/uL — ABNORMAL LOW (ref 3.87–5.11)
RDW: 16.4 % — ABNORMAL HIGH (ref 11.5–15.5)
WBC: 4.9 10*3/uL (ref 4.0–10.5)
nRBC: 0 % (ref 0.0–0.2)

## 2019-02-08 MED ORDER — LIDOCAINE HCL (PF) 1 % IJ SOLN: 5.0000 mL | INTRAMUSCULAR | Status: DC | PRN

## 2019-02-08 MED ORDER — SODIUM CHLORIDE 0.9 % IV SOLN: 100.0000 mL | INTRAVENOUS | Status: DC | PRN

## 2019-02-08 MED ORDER — PENTAFLUOROPROP-TETRAFLUOROETH EX AERO: 1.0000 "application " | INHALATION_SPRAY | CUTANEOUS | Status: DC | PRN

## 2019-02-08 MED ORDER — CALCITRIOL 0.5 MCG PO CAPS
ORAL_CAPSULE | ORAL | Status: AC
  Administered 2019-02-08: 2 ug via ORAL
  Filled 2019-02-08: qty 4

## 2019-02-08 MED ORDER — HEPARIN SODIUM (PORCINE) 1000 UNIT/ML DIALYSIS: 1000.0000 [IU] | INTRAMUSCULAR | Status: DC | PRN

## 2019-02-08 MED ORDER — ALTEPLASE 2 MG IJ SOLR: 2.0000 mg | Freq: Once | INTRAMUSCULAR | Status: DC | PRN

## 2019-02-08 MED ORDER — LIDOCAINE-PRILOCAINE 2.5-2.5 % EX CREA: 1.0000 "application " | TOPICAL_CREAM | CUTANEOUS | Status: DC | PRN

## 2019-02-08 NOTE — Discharge Summary (Signed)
Physician Discharge Summary  Jessica Clarke WGN:[id number] DOB: [birth year] DOA: 02/05/2019  PCP: Arnette Felts, FNP  Admit date: 02/05/2019 Discharge date: 02/08/2019  Admitted From:home Disposition: home  Recommendations for Outpatient Follow-up:  1. Follow up with PCP in 1-2 weeks 2. Please obtain BMP/CBC in one week 3. Please follow up at dialysis :  Home Health: Equipment/Devices:none  Discharge Condition:stable CODE STATUS full Diet recommendation:renal/cardiac Brief/Interim Summary:68 y.o.femalewithhistory of ESRD on hemodialysis on Monday Wednesday Friday, recently diagnosed non-small cell lung cancer planning to have chemo/radiation next month, hypertension, anemia who was admitted in September 2020 for anemia at that time colonoscopy was showing only diverticulosis and also had E. coli colitis for which patient was treated with Cipro at that same admission was brought to the ER from the dialysis center after patient was not feeling well. Per daughter patient has been having chronic diarrhea for last 2 to 3 months with at times having vomiting. Has been having abdominal pain mostly in the upper quadrants. Yesterday patient was transferred before dialysis.  ED Course:In the ER patient had labs drawn which showed alkaline phosphatase of 152 albumin 2.5 lipase 24 AST ALT were normal. High sensitive troponin were 25 and 23. EKG shows normal sinus rhythm. CT of the abdomen done without contrast showed features concerning for enteritis. CT also showed pleural effusion. Chest x-ray showed pleural effusion and congestion. COVID-19 was negative. Patient required 4 L oxygen at this time and nephrology was notified. Patient admitted for abdominal pain with enteritis and chronic diarrhea with acute respiratory failure likely from fluid overload. Discharge Diagnoses:  Principal Problem:   Acute pulmonary edema (HCC) Active Problems:   Hypertension   NSTEMI-08/25/13- non  obstructive CAD at cath 08/26/13   ESRD (end stage renal disease) on dialysis [facility])   Lung mass   Adenocarcinoma of right lung, stage 3 (HCC)   Enteritis   Acute respiratory failure with hypoxia (HCC)  #1  Acute hypoxic respiratory failure secondary to acute pulmonary edema-patient missed 1 dialysis session. she had HD 12/25and 12/26 .  Her usual days are Monday Wednesday and Friday.  She is a newly diagnosed  non-small cell lung cancer.   #2 non-small cell lung cancer-followed by oncology  #3 hypertension blood pressure soft 100/56.  DC atenolol.  Continue midodrine 10 mg twice a day  For blood pressure support during dialysis.    #5 anemia of chronic renal disease- hemoglobin at baseline 10 or above.  Aranesp per renal.  #6 hyperlipidemia on statin  #7 gout on allopurinol  #8 abdominal pain exam of admission CT scan of the abdomen-Mild small bowel wall thickening in the left side of the abdomen as can be seen with enteritis secondary to an infectious or inflammatory etiology. Generalized anasarca. Small right pleural effusion. Diverticulosis without evidence of diverticulitis. She has chronic diarrhea and no abdominal tenderness Will dc antibiotics   Estimated body mass index is 21.37 kg/m as calculated from the following:   Height as of this encounter: 5\' 11"  (1.803 m).   Weight as of this encounter: 69.5 kg.  Discharge Instructions  Discharge Instructions    Call MD for:  difficulty breathing, headache or visual disturbances   Complete by: As directed    Call MD for:  persistant nausea and vomiting   Complete by: As directed    Call MD for:  severe uncontrolled pain   Complete by: As directed    Diet - low sodium heart healthy   Complete by: As directed  Increase activity slowly   Complete by: As directed      Allergies as of 02/08/2019   No Known Allergies     Medication List    STOP taking these medications   aspirin EC 325 MG tablet   atenolol  100 MG tablet Commonly known as: TENORMIN   hydrocerin Crea   ipratropium-albuterol 0.5-2.5 (3) MG/3ML Soln Commonly known as: DUONEB   nicotine 14 mg/24hr patch Commonly known as: NICODERM CQ - dosed in mg/24 hours     TAKE these medications   acetaminophen 325 MG tablet Commonly known as: TYLENOL Take 325-650 mg by mouth every 8 (eight) hours as needed for mild pain.   allopurinol 100 MG tablet Commonly known as: ZYLOPRIM Take 100 mg by mouth daily.   atorvastatin 10 MG tablet Commonly known as: LIPITOR Take 10 mg by mouth at bedtime.   B Complex Formula 1 Tabs Take 1 tablet by mouth daily.   calcitRIOL 0.5 MCG capsule Commonly known as: ROCALTROL Take 2 capsules (1 mcg total) by mouth every Monday, Wednesday, and Friday with hemodialysis.   cinacalcet 30 MG tablet Commonly known as: SENSIPAR Take 1 tablet (30 mg total) by mouth daily with supper.   colchicine 0.6 MG tablet Take 0.6 mg by mouth daily as needed (as directed for gout flares or pain).   Darbepoetin Alfa 60 MCG/0.3ML Sosy injection Commonly known as: ARANESP Inject 0.3 mLs (60 mcg total) into the vein every Friday with hemodialysis.   feeding supplement (PRO-STAT SUGAR FREE 64) Liqd Take 30 mLs by mouth 2 (two) times daily.   loperamide 2 MG capsule Commonly known as: IMODIUM Take 2 mg by mouth daily as needed for diarrhea or loose stools.   midodrine 10 MG tablet Commonly known as: PROAMATINE Take 1 tablet (10 mg total) by mouth 2 (two) times daily with a meal.   multivitamin Tabs tablet Take 1 tablet by mouth at bedtime.   ondansetron 4 MG disintegrating tablet Commonly known as: Zofran ODT 4mg  ODT q4 hours prn nausea/vomit   prochlorperazine 10 MG tablet Commonly known as: COMPAZINE Take 1 tablet (10 mg total) by mouth every 6 (six) hours as needed for nausea or vomiting.   thiamine 100 MG tablet Commonly known as: VITAMIN B-1 Take 100 mg by mouth daily.   Velphoro 500 MG chewable  tablet Generic drug: sucroferric oxyhydroxide Chew 1,000 mg by mouth 3 (three) times daily with meals.   Vitamin D (Cholecalciferol) 25 MCG (1000 UT) Tabs Take 1,000 Units by mouth daily.      Follow-up Information    Arnette Felts, FNP Follow up.   Specialty: General Practice Contact information: [address] [place] [address] [phone]          No Known Allergies  Consultations: renal Procedures/Studies: DG Chest 2 View  Result Date: 02/05/2019 CLINICAL DATA:  68 year old female with nausea vomiting and pleuritic chest pain. EXAM: CHEST - 2 VIEW COMPARISON:  Portable chest 10/22/2018 and earlier. CT chest 11/12/2018. FINDINGS: Spiculated right upper lobe lung mass demonstrated by CT has been poorly visible radiographically but might have regressed. Right pleural effusion probably persists, small. Stable cardiomegaly and mediastinal contours. No pneumothorax. No definite left pleural effusion. Pulmonary vascularity is unchanged with symmetric vascular congestion suspected. No new confluent opacity. Negative visible bowel gas pattern. No acute osseous abnormality identified. IMPRESSION: 1. Spiculated right upper lobe lung mass has been better demonstrated by CT. 2. Cardiomegaly with continued pulmonary vascular congestion suspicious for mild  interstitial edema. Small right pleural effusion suspected. 3. No pneumothorax or other acute cardiopulmonary abnormality. Electronically Signed   By: Odessa Fleming M.D.   On: 02/05/2019 13:24   NM PET Image Restag (PS) Skull Base To Thigh  Result Date: 01/16/2019 CLINICAL DATA:  Subsequent treatment strategy for non-small cell lung cancer. EXAM: NUCLEAR MEDICINE PET SKULL BASE TO THIGH TECHNIQUE: 8.8 mCi F-18 FDG was injected intravenously. Full-ring PET imaging was performed from the skull base to thigh after the radiotracer. CT data was obtained and used for attenuation correction and anatomic localization. Fasting blood  glucose: 76 mg/dl COMPARISON:  CT chest 60/45/4098 and PET-CT from 02/16/2018 FINDINGS: Mediastinal blood pool activity: SUV max 1.8 Liver activity: SUV max 2.5 NECK: No significant abnormal hypermetabolic activity in this region. Incidental CT findings: None CHEST: Right paratracheal node 2.1 cm in short axis on image 58/4 with maximum SUV 11.7, previous measurements 1.0 cm on 02/26/2018 and 1.9 cm on 11/12/2018 with previous maximum SUV 5.6. Right hilar lymph node 2.0 cm in short axis on image 67/4, previously 1.8 cm on 11/12/2018 and previously 1.5 cm on 02/26/2018. Current maximum SUV 12.2, previously 4.4 on 02/26/2018. Right upper lobe pulmonary nodule with spiculated margins measuring 2.3 by 1.6 cm on image 16/8, previously cavitary and measuring 2.5 by 1.9 cm on 11/12/2018 and 2.0 by 1.6 cm on 02/26/2018. Current maximum SUV 6.0, previous maximum SUV 9.9. Faintly accentuated activity along AP window lymph nodes, difficult to measure due to indistinct margins but up to about 1.1 cm in thickness on image 63/4, maximum SUV 4.4, previously 3.5. Incidental CT findings: Coronary, aortic arch, and branch vessel atherosclerotic vascular disease. Moderate cardiomegaly. Mitral valve calcification. Small right pleural effusion similar to 11/12/2018. Paraseptal emphysema. Mild interstitial accentuation in both lungs. ABDOMEN/PELVIS: Sigmoid diverticulosis with local likely physiologic activity along the sigmoid colon with maximum SUV 7.4, previously 4.6 in this region. Incidental CT findings: Ascites with diffuse third spacing of fluid in the subcutaneous tissues and mesentery. Severe bilateral renal atrophy. Aortoiliac atherosclerotic vascular disease. Sigmoid colon diverticulosis. Low-grade accentuated metabolic activity associated with the diffuse third spacing of fluid. SKELETON: Faintly accentuated activity along the left facet joint at about the C3-4 level, probably from facet arthropathy, the maximum SUV is only  4.2. No definite worrisome activity identified. Incidental CT findings: Cervical and lumbar spondylosis. IMPRESSION: 1. Although the primary right upper lobe lesion is smaller than it was on 11/12/2018 at current measurement of 2.3 by 1.6 cm, the lesion is hypermetabolic with maximum SUV of 6.0 compatible with malignancy. Prior SUV was 9.9. 2. In contrast to the reduction in activity in the nodule, there is significant increased size and activity in the right paratracheal and right hilar adenopathy compatible with progressive malignancy. 3. Continued low-grade activity along borderline enlarged AP window lymph nodes. Current maximum SUV is 4.4, previously 3.5. Contralateral mediastinal nodal metastatic disease is not excluded given this appearance. 4. No persuasive evidence of metastatic disease to the neck, abdomen/pelvis, or skeleton. 5. Other imaging findings of potential clinical significance: Aortic Atherosclerosis (ICD10-I70.0). Coronary atherosclerosis. Mild cardiomegaly. Mitral valve calcification. Stable small right pleural effusion. Paraseptal emphysema. Chronic interstitial accentuation in the lungs. Sigmoid diverticulosis. Ascites. Third spacing of fluid in the subcutaneous tissues and mesentery. Severe bilateral renal atrophy. Sigmoid colon diverticulosis. Electronically Signed   By: Gaylyn Rong M.D.   On: 01/16/2019 17:19   CT Renal Stone Study  Result Date: 02/05/2019 CLINICAL DATA:  Flank pain, generalized weakness EXAM: CT ABDOMEN AND  PELVIS WITHOUT CONTRAST TECHNIQUE: Multidetector CT imaging of the abdomen and pelvis was performed following the standard protocol without IV contrast. COMPARISON:  None. FINDINGS: Lower chest: Small right pleural effusion. No left pleural effusion. Cardiomegaly. Hepatobiliary: No focal hepatic mass. Calcification along the inferior margin of the gallbladder which may reflect a gallstone or volume averaging from the adjacent small bowel. No intrahepatic  biliary ductal dilatation. Pancreas: Unremarkable. No pancreatic ductal dilatation or surrounding inflammatory changes. Spleen: Normal in size without focal abnormality. Adrenals/Urinary Tract: Indeterminate 9 mm left adrenal nodule. Severe atrophy of bilateral kidneys. No urolithiasis or obstructive uropathy. Decompressed bladder. Stomach/Bowel: No bowel dilatation to suggest obstruction. Mild small bowel wall thickening in the left side of the abdomen as can be seen with enteritis secondary to an infectious or inflammatory etiology. No pneumatosis, pneumoperitoneum or portal venous gas. Diverticulosis without evidence of diverticulitis. Vascular/Lymphatic: Normal caliber abdominal aorta with mild atherosclerosis. No lymphadenopathy. Reproductive: Status post hysterectomy. No adnexal masses. Other: No fluid collection or hematoma.  Anasarca is present. Musculoskeletal: No acute osseous abnormality. No aggressive osseous lesion. Degenerative disease with disc height loss throughout the lumbar spine. Schmorl's node at L3 and L4. IMPRESSION: 1. Mild small bowel wall thickening in the left side of the abdomen as can be seen with enteritis secondary to an infectious or inflammatory etiology. 2. Generalized anasarca. 3. Small right pleural effusion. 4. Diverticulosis without evidence of diverticulitis. 5. No bowel obstruction. Electronically Signed   By: Elige Ko   On: 02/05/2019 14:51    (Echo, Carotid, EGD, Colonoscopy, ERCP)    Subjective: Anxious to go home   Discharge Exam: Vitals:   02/08/19 1244 02/08/19 1342  BP: (!) 96/47 108/60  Pulse: 69 67  Resp: 18 18  Temp: 98.2 F (36.8 C) 97.9 F (36.6 C)  SpO2: 100% 98%   Vitals:   02/08/19 1200 02/08/19 1230 02/08/19 1244 02/08/19 1342  BP: (!) 102/53 (!) 102/47 (!) 96/47 108/60  Pulse: 69 69 69 67  Resp:  18 18 18   Temp:  98.2 F (36.8 C) 98.2 F (36.8 C) 97.9 F (36.6 C)  TempSrc:  Oral Oral Oral  SpO2:  100% 100% 98%  Weight:   69.5  kg   Height:        General: Pt is alert, awake, not in acute distress Cardiovascular: RRR, S1/S2 +, no rubs, no gallops Respiratory: CTA bilaterally, no wheezing, no rhonchi Abdominal: Soft, NT, ND, bowel sounds + Extremities: no edema, no cyanosis    The results of significant diagnostics from this hospitalization (including imaging, microbiology, ancillary and laboratory) are listed below for reference.     Microbiology: Recent Results (from the past 240 hour(s))  SARS CORONAVIRUS 2 (TAT 6-24 HRS) Nasopharyngeal Nasopharyngeal Swab     Status: None   Collection Time: 02/05/19  8:04 PM   Specimen: Nasopharyngeal Swab  Result Value Ref Range Status   SARS Coronavirus 2 NEGATIVE NEGATIVE Final    Comment: (NOTE) SARS-CoV-2 target nucleic acids are NOT DETECTED. The SARS-CoV-2 RNA is generally detectable in upper and lower respiratory specimens during the acute phase of infection. Negative results do not preclude SARS-CoV-2 infection, do not rule out co-infections with other pathogens, and should not be used as the sole basis for treatment or other patient management decisions. Negative results must be combined with clinical observations, patient history, and epidemiological information. The expected result is Negative. Fact Sheet for Patients: HairSlick.no Fact Sheet for Healthcare Providers: quierodirigir.com This test is not yet approved  or cleared by the [place] and  has been authorized for detection and/or diagnosis of SARS-CoV-2 by FDA under an Emergency Use Authorization (EUA). This EUA will remain  in effect (meaning this test can be used) for the duration of the COVID-19 declaration under Section 56 4(b)(1) of the Act, 21 U.S.C. section 360bbb-3(b)(1), unless the authorization is terminated or revoked sooner. Performed at [facility], [address], [place], [place] [address]   MRSA PCR  Screening     Status: None   Collection Time: 02/06/19 12:52 AM   Specimen: Nasal Mucosa; Nasopharyngeal  Result Value Ref Range Status   MRSA by PCR NEGATIVE NEGATIVE Final    Comment:        The GeneXpert MRSA Assay (FDA approved for NASAL specimens only), is one component of a comprehensive MRSA colonization surveillance program. It is not intended to diagnose MRSA infection nor to guide or monitor treatment for MRSA infections. Performed at [facility], [address], [place], [place] [address]      Labs: BNP (last 3 results) No results for input(s): BNP in the last 8760 hours. Basic Metabolic Panel: Recent Labs  Lab 02/04/19 1029 02/05/19 1325 02/05/19 1452 02/05/19 1541 02/06/19 0312 02/08/19 0858  NA 140 139 140 139 141 129*  K 4.1 4.1 4.4 4.2 4.3 3.7  CL 100 100 100  --  102 93*  CO2 26 25  --   --  25 24  GLUCOSE 83 79 72  --  97 79  BUN 29* 39* 49*  --  47* 28*  CREATININE 4.81* 5.84* 5.50*  --  6.14* 4.90*  CALCIUM 8.8* 8.7*  --   --  8.2* 7.4*  PHOS  --   --   --   --   --  4.6   Liver Function Tests: Recent Labs  Lab 02/04/19 1029 02/05/19 1325 02/08/19 0858  AST 15 15  --   ALT 8 12  --   ALKPHOS 167* 152*  --   BILITOT 0.8 0.9  --   PROT 7.5 7.3  --   ALBUMIN 2.7* 2.5* 2.2*   Recent Labs  Lab 02/05/19 1325  LIPASE 24   No results for input(s): AMMONIA in the last 168 hours. CBC: Recent Labs  Lab 02/04/19 1029 02/05/19 1325 02/05/19 1452 02/05/19 1541 02/06/19 0312 02/08/19 0858  WBC 4.7 6.5  --   --  4.9 4.9  NEUTROABS 3.2 4.8  --   --   --   --   HGB 9.0* 9.0* 9.5* 9.2* 7.9* 7.9*  HCT 28.4* 28.1* 28.0* 27.0* 24.1* 24.8*  MCV 96.3 95.9  --   --  94.9 95.8  PLT 137* 148*  --   --  148* 155   Cardiac Enzymes: No results for input(s): CKTOTAL, CKMB, CKMBINDEX, TROPONINI in the last 168 hours. BNP: Invalid input(s): POCBNP CBG: No results for input(s): GLUCAP in the last 168 hours. D-Dimer No results for input(s):  DDIMER in the last 72 hours. Hgb A1c No results for input(s): HGBA1C in the last 72 hours. Lipid Profile No results for input(s): CHOL, HDL, LDLCALC, TRIG, CHOLHDL, LDLDIRECT in the last 72 hours. Thyroid function studies No results for input(s): TSH, T4TOTAL, T3FREE, THYROIDAB in the last 72 hours.  Invalid input(s): FREET3 Anemia work up Recent Labs    02/06/19 1442 02/07/19 1434  FERRITIN  --  513*  TIBC 206*  --   IRON 39  --  Urinalysis    Component Value Date/Time   COLORURINE YELLOW 08/27/2013 0546   APPEARANCEUR CLOUDY (A) 08/27/2013 0546   LABSPEC 1.026 08/27/2013 0546   PHURINE 7.5 08/27/2013 0546   GLUCOSEU NEGATIVE 08/27/2013 0546   HGBUR TRACE (A) 08/27/2013 0546   BILIRUBINUR SMALL (A) 08/27/2013 0546   KETONESUR NEGATIVE 08/27/2013 0546   PROTEINUR >300 (A) 08/27/2013 0546   UROBILINOGEN 0.2 08/27/2013 0546   NITRITE NEGATIVE 08/27/2013 0546   LEUKOCYTESUR NEGATIVE 08/27/2013 0546   Sepsis Labs Invalid input(s): PROCALCITONIN,  WBC,  LACTICIDVEN Microbiology Recent Results (from the past 240 hour(s))  SARS CORONAVIRUS 2 (TAT 6-24 HRS) Nasopharyngeal Nasopharyngeal Swab     Status: None   Collection Time: 02/05/19  8:04 PM   Specimen: Nasopharyngeal Swab  Result Value Ref Range Status   SARS Coronavirus 2 NEGATIVE NEGATIVE Final    Comment: (NOTE) SARS-CoV-2 target nucleic acids are NOT DETECTED. The SARS-CoV-2 RNA is generally detectable in upper and lower respiratory specimens during the acute phase of infection. Negative results do not preclude SARS-CoV-2 infection, do not rule out co-infections with other pathogens, and should not be used as the sole basis for treatment or other patient management decisions. Negative results must be combined with clinical observations, patient history, and epidemiological information. The expected result is Negative. Fact Sheet for Patients: HairSlick.no Fact Sheet for Healthcare  Providers: quierodirigir.com This test is not yet approved or cleared by the [place] FDA and  has been authorized for detection and/or diagnosis of SARS-CoV-2 by FDA under an Emergency Use Authorization (EUA). This EUA will remain  in effect (meaning this test can be used) for the duration of the COVID-19 declaration under Section 56 4(b)(1) of the Act, 21 U.S.C. section 360bbb-3(b)(1), unless the authorization is terminated or revoked sooner. Performed at [facility], [address], [place], [place] [address]   MRSA PCR Screening     Status: None   Collection Time: 02/06/19 12:52 AM   Specimen: Nasal Mucosa; Nasopharyngeal  Result Value Ref Range Status   MRSA by PCR NEGATIVE NEGATIVE Final    Comment:        The GeneXpert MRSA Assay (FDA approved for NASAL specimens only), is one component of a comprehensive MRSA colonization surveillance program. It is not intended to diagnose MRSA infection nor to guide or monitor treatment for MRSA infections. Performed at [facility], [address], [place], [place] [address]      Time coordinating discharge:  39 minutes  SIGNED:   Alwyn Ren, MD  [facility] 02/08/2019, 2:17 PM Pager   If 7PM-7AM, please contact night-coverage [email or web address] Password [email or web address]

## 2019-02-08 NOTE — Procedures (Signed)
Patient seen on Hemodialysis- appears comfortable without oxygen (nasal cannula hanging by her side). BP (!) 102/56   Pulse (!) 57   Temp 98.2 F (36.8 C) (Oral)   Resp 18   Ht 5\' 11"  (1.803 m)   Wt 71 kg   SpO2 100%   BMI 21.83 kg/m   QB 400, UF goal 2L. Tolerating treatment without problems, had some epistaxis earlier with coughing up some blood tinged sputum.   Jessica Shiley MD [facility]. Office # [phone] Pager # [phone] 10:01 AM

## 2019-02-08 NOTE — Care Management (Signed)
Discussed DME needs with RN.  Dr. Rodena Piety advised to keep patient overnight and reassess need for O2 tomorrow.  RN will ambulate patient to see if patient qualifies for DME O2 tomorrow morning.  RW will also be provided for patient tomorrow.

## 2019-02-08 NOTE — Evaluation (Signed)
Physical Therapy Evaluation & Discharge Patient Details Name: Jessica Clarke MRN: [id number] DOB: [birth year] Today's Date: 02/08/2019   History of Present Illness  Pt is a 68 y.o. female admitted 02/05/19 with acute hypoxic respiratory failure secondary to acute pulmonary edema, pt missed 1 HD session. PMH includes new dx of non-small cell lung CA (to start chemo/radiation next month), HTN, ESRD (HD MWF), anemia, tobacco use; recent admission 11/03/18 with diverticulosis and e. coli colitis.    Clinical Impression  Patient evaluated by Physical Therapy with no further acute PT needs identified (pt declining follow-up PT services). PTA, pt independent with ambulation and ADLs, has support from children. Today, pt moving well at supervision-level, stability improved with RW. Despite multiple attempts with various devices, unable to get reliable SpO2 reading (expect 1x brief read of 94% on RA while sitting EOB). Pt asymptomatic with ambulation, denies SOB. Educ re: energy conservation, activity recommendations, importance of mobility. All education has been completed and the patient has no further questions. Acute PT is signing off. Thank you for this referral.    Follow Up Recommendations No PT follow up;Supervision - Intermittent(declined HH services)    Equipment Recommendations  Rolling walker with 5" wheels    Recommendations for Other Services       Precautions / Restrictions Precautions Precautions: Fall Restrictions Weight Bearing Restrictions: No      Mobility  Bed Mobility               General bed mobility comments: Received sitting EOB  Transfers Overall transfer level: Modified independent Equipment used: None;Rolling walker (2 wheeled)             General transfer comment: Reaching to sink to stabilize upon initial standing; mod indep with RW  Ambulation/Gait Ambulation/Gait assistance: Supervision Gait Distance (Feet): 150 Feet Assistive device:  None;Rolling walker (2 wheeled)     Gait velocity interpretation: 1.31 - 2.62 ft/sec, indicative of limited community ambulator General Gait Details: Initial ambulation without DME, pt reaching for UE support on IV pole/furniture; slow, steady gait with RW. Unable to get pulse ox read despite multiple attempts/devices, pt denies SOB  Stairs            Wheelchair Mobility    Modified Rankin (Stroke Patients Only)       Balance Overall balance assessment: Needs assistance   Sitting balance-Leahy Scale: Good Sitting balance - Comments: Indep to don shoes sitting EOB     Standing balance-Leahy Scale: Fair                               Pertinent Vitals/Pain Pain Assessment: Faces Pain Location: Neck, shoulders "from the bed" Pain Descriptors / Indicators: Sore Pain Intervention(s): Monitored during session;Other (comment)(provided cervical stretches)    Home Living Family/patient expects to be discharged to:: Private residence Living Arrangements: Children Available Help at Discharge: Family;Available PRN/intermittently Type of Home: Apartment Home Access: Stairs to enter Entrance Stairs-Rails: None Entrance Stairs-Number of Steps: 2 Home Layout: One level Home Equipment: None      Prior Function Level of Independence: Independent               Hand Dominance   Dominant Hand: Right    Extremity/Trunk Assessment   Upper Extremity Assessment Upper Extremity Assessment: Generalized weakness    Lower Extremity Assessment Lower Extremity Assessment: Generalized weakness       Communication   Communication: HOH  Cognition Arousal/Alertness: Awake/alert Behavior During  Therapy: Flat affect Overall Cognitive Status: No family/caregiver present to determine baseline cognitive functioning Area of Impairment: Attention;Problem solving;Awareness                   Current Attention Level: Selective       Awareness:  Emergent Problem Solving: Slow processing;Requires verbal cues General Comments: Pt initially declining to participate, just wants to go home and focused on this. Seems HOH requiring repetition of questions/cues before pt responding      General Comments General comments (skin integrity, edema, etc.): 1x pulse ox reading of 94% on RA, besides this, unable to get pulse ox to read/register with reliable pleth despite use of multiple devices    Exercises     Assessment/Plan    PT Assessment Patent does not need any further PT services  PT Problem List         PT Treatment Interventions      PT Goals (Current goals can be found in the Care Plan section)  Acute Rehab PT Goals Patient Stated Goal: Home now PT Goal Formulation: All assessment and education complete, DC therapy    Frequency     Barriers to discharge        Co-evaluation               AM-PAC PT "6 Clicks" Mobility  Outcome Measure Help needed turning from your back to your side while in a flat bed without using bedrails?: None Help needed moving from lying on your back to sitting on the side of a flat bed without using bedrails?: None Help needed moving to and from a bed to a chair (including a wheelchair)?: None Help needed standing up from a chair using your arms (e.g., wheelchair or bedside chair)?: None Help needed to walk in hospital room?: None Help needed climbing 3-5 steps with a railing? : A Little 6 Click Score: 23    End of Session   Activity Tolerance: Patient tolerated treatment well Patient left: in bed;with call bell/phone within reach Nurse Communication: Mobility status PT Visit Diagnosis: Other abnormalities of gait and mobility (R26.89)    Time: 0981-1914 PT Time Calculation (min) (ACUTE ONLY): 26 min   Charges:   PT Evaluation $PT Eval Moderate Complexity: [place], PT, DPT Acute Rehabilitation Services  Pager [phone] Office [place] 02/08/2019, 4:40 PM

## 2019-02-08 NOTE — Progress Notes (Signed)
02/08/2019 Patient saturation was 88 to 89. Dr Zigmund Daniel was made aware. Oxygen was order, but patient was not able to get oxygen until in the morning. Physical therapy also recommended a walker for patient. Patient refuse to stay till in the morning. Per Dr Zigmund Daniel if patient saturation is 61 she can be discharge. Patient saturation was retaken again at [place] it was 92% on room air. Rn did contact MD and per MD patient can be discharge. Per MD if patient need walker she need to talk to he primary care physician. Rn went over patient discharge paper, medication and schedule appointment. Patient was stable when she left the unit. [facility] RN.

## 2019-02-09 ENCOUNTER — Telehealth: Payer: Self-pay | Admitting: Nephrology

## 2019-02-09 NOTE — Telephone Encounter (Signed)
Transition of care contact from inpatient facility   Date of discharge: 02/08/19 Date of contact: 02/09/19  Method of contact: Phone, spoke with daughter, [name]  Patient contacted to discuss transition of care from recent inpatient hospitalization. Patient was admitted to [facility] from 12/23 -02/08/19 with discharge diangosis of acute hypoxic respiratory failure secondary to pulmonary edema due to missed dialsysis.   Medication changes reviewed Patient will follow up with her outpatient dialysis center on Monday 12/28.  Other follow up needs include follow up with primary care within 2 weeks.

## 2019-02-10 ENCOUNTER — Telehealth: Payer: Medicare Other

## 2019-02-10 ENCOUNTER — Ambulatory Visit: Payer: Self-pay

## 2019-02-10 ENCOUNTER — Other Ambulatory Visit: Payer: Self-pay

## 2019-02-10 DIAGNOSIS — N186 End stage renal disease: Secondary | ICD-10-CM

## 2019-02-10 DIAGNOSIS — Z992 Dependence on renal dialysis: Secondary | ICD-10-CM

## 2019-02-10 DIAGNOSIS — C3491 Malignant neoplasm of unspecified part of right bronchus or lung: Secondary | ICD-10-CM

## 2019-02-10 DIAGNOSIS — I10 Essential (primary) hypertension: Secondary | ICD-10-CM

## 2019-02-10 NOTE — Chronic Care Management (AMB) (Signed)
Chronic Care Management   Follow Up Note   02/10/2019 Name: Jessica Clarke MRN: [id number] DOB: [birth year]  Referred by: [name], FNP Reason for referral : Chronic Care Management (CCM RNCM Telephone Follow up )   Jessica Clarke is a 68 y.o. year old female who is a primary care patient of [name], [place]. The CCM team was consulted for assistance with chronic disease management and care coordination needs.    Review of patient status, including review of consultants reports, relevant laboratory and other test results, and collaboration with appropriate care team members and the patient's provider was performed as part of comprehensive patient evaluation and provision of chronic care management services.    SDOH (Social Determinants of Health) screening performed today: None. See Care Plan for related entries.   Placed an outbound call to patient/daughter for a CCM RN CM follow up.   Outpatient Encounter Medications as of 02/10/2019  Medication Sig  . acetaminophen (TYLENOL) 325 MG tablet Take 325-650 mg by mouth every 8 (eight) hours as needed for mild pain.   Marland Kitchen allopurinol (ZYLOPRIM) 100 MG tablet Take 100 mg by mouth daily.    . Amino Acids-Protein Hydrolys (FEEDING SUPPLEMENT, PRO-STAT SUGAR FREE 64,) LIQD Take 30 mLs by mouth 2 (two) times daily.  Marland Kitchen atorvastatin (LIPITOR) 10 MG tablet Take 10 mg by mouth at bedtime.   . B Complex-Folic Acid (B COMPLEX FORMULA 1) TABS Take 1 tablet by mouth daily.   . calcitRIOL (ROCALTROL) 0.5 MCG capsule Take 2 capsules (1 mcg total) by mouth every Monday, Wednesday, and Friday with hemodialysis.  Marland Kitchen cinacalcet (SENSIPAR) 30 MG tablet Take 1 tablet (30 mg total) by mouth daily with supper.  . colchicine 0.6 MG tablet Take 0.6 mg by mouth daily as needed (as directed for gout flares or pain).   . Darbepoetin Alfa (ARANESP) 60 MCG/0.3ML SOSY injection Inject 0.3 mLs (60 mcg total) into the vein every Friday with hemodialysis.  Marland Kitchen loperamide  (IMODIUM) 2 MG capsule Take 2 mg by mouth daily as needed for diarrhea or loose stools.   . midodrine (PROAMATINE) 10 MG tablet Take 1 tablet (10 mg total) by mouth 2 (two) times daily with a meal.  . multivitamin (RENA-VIT) TABS tablet Take 1 tablet by mouth at bedtime.  . ondansetron (ZOFRAN ODT) 4 MG disintegrating tablet 62m ODT q4 hours prn nausea/vomit  . prochlorperazine (COMPAZINE) 10 MG tablet Take 1 tablet (10 mg total) by mouth every 6 (six) hours as needed for nausea or vomiting.  . thiamine (VITAMIN B-1) 100 MG tablet Take 100 mg by mouth daily.  . VELPHORO 500 MG chewable tablet Chew 1,000 mg by mouth 3 (three) times daily with meals.   . Vitamin D, Cholecalciferol, 25 MCG (1000 UT) TABS Take 1,000 Units by mouth daily.    No facility-administered encounter medications on file as of 02/10/2019.   Goals Addressed      Patient Stated   . "I need a biopsy to determine what medication will help" (pt-stated)       Current Barriers:  . Limited testing to identify lesion in L lung . Knowledge Deficits related to evaluation and treatment of L lung mass . Chronic Disease Management support and education needs related to End Stage Renal Disease on dialysis, HTN, Lung Mass  Clinical Social Work Clinical Goal(s):  .Marland KitchenOver the next 30 days the patient will attend lung biopsy as scheduled on 12/04/18 to assist with tissue identification . Over the next 60  days the patient will become more knowledgeable of diagnosis as evidenced by ability to verbalize treatment plan  CCM RN CM Interventions: 02/10/19 call completed with patients daughter Jessica Clarke  . Evaluation of current treatment plan related to Bronchoscopy for Lung Bx and patient's adherence to plan as established by provider . Determined the patient and daughter Jessica Clarke have a good understanding of the patient's CA txt plan including radiation therapy and Chemo therapy . Determined the patient's daughter has no questions related to these  recommendations at this time and will contact the Oncologist and or Cancer Ctr with questions . Discussed plans with patient for ongoing care management follow up and provided patient with direct contact information for care management team . Reviewed scheduled/upcoming provider appointments including: all upcoming appointments pertaining to patient's Cancer treatment   Patient Self Care Activities:  . Self administers medications as prescribed . Attends all scheduled provider appointments . Calls provider office for new concerns or questions   Please see past updates related to this goal by clicking on the "Past Updates" button in the selected goal        Other   . "to have fewer trips to the hospital"       Current Barriers:  Marland Kitchen Knowledge Deficits related to ESRD including dietary and fluid restrictions to help maintain homeostasis . Chronic Disease Management support and education needs related to End Stage Renal Disease on dialysis, HTN, Lung Mass  Nurse Case Manager Clinical Goal(s):  Marland Kitchen Over the next 60 days, patient will verbalize understanding of plan for dietary and fluid restrictions as provided and directed by Nephrology and the the Hemodialysis nutrtiionist . Over the next 90 days, patient will not experience a hospital readmission secondary to ESRD and or Cardiopulmonary complication such as Pulmonary edema   CCM RN CM Interventions:  02/10/19 call completed with daughter Jessica Clarke . Evaluation of current treatment plan related to ESRD/hemodialysis and patient's adherence to plan as established by provider. . Provided education to patient re: Pulmonary edema can occur if patient does not adhere to the fluid restriction provided by the dialysis staff/nutritionist . Discussed plans with patient for ongoing care management follow up and provided patient with direct contact information for care management team  Patient Self Care Activities:  . Self administers medications as  prescribed . Attends all scheduled provider appointments . Calls pharmacy for medication refills . Calls provider office for new concerns or questions . Unable to perform ADLs independently . Unable to perform IADLs independently  Initial goal documentation    . "We haven't heard from the stomach doctor"       Daughter stated Current Barriers:  Marland Kitchen Knowledge Deficits related to treatment management of Gastrointestinal hemorrhage and suspicious Colon polyp . Chronic Disease Management support and education needs related to GI hemorrhage, End Stage Renal Disease on dialysis, HTN, Lung Mass  Nurse Case Manager Clinical Goal(s):  Marland Kitchen Over the next 30 days, patient will have a new patient appointment with Gastroenterology for evaluation and treatment of recent GI hemorrhage and Colon polyp COMPLETED . New 12/19/18 - Over the next 30 days, patient will have a good understanding of the plan for ongoing treatment management of her GI symptoms and will adhere to those recommendations  CCM RN CM Interventions:  12/27/18 call completed with patients daughter Jessica Clarke   . Discussed patient's recent GI f/u to evaluate the prescribed plan of care; daughter Jessica Clarke advised the patient had to provide new stool samples, she is unsure why due  to not entering the [place] with her mother . Reviewed and discussed it appears the previous stool samples were inconclusive therefore requiring new samples in order to determine if disease is present contributing to patient's symptoms . Discussed patient's symptoms have improved but have not resolved  . Reviewed the patient's next steps are to try Ibgard, in which samples were provided for bloating and can be purchased over-the-counter; the GI office will call you with results . Evaluation of current treatment plan related to GI symptoms and patient's adherence to plan as established by provider. . Discussed plans with patient for ongoing care management follow up and provided  patient with direct contact information for care management team  Patient Self Care Activities:  . Unable to independently to manage Chronic conditions  Please see past updates related to this goal by clicking on the "Past Updates" button in the selected goal      . To assist patient with obtaining an Oxygen Concentrator       Current Barriers:  Marland Kitchen Knowledge Deficits related to how to obtain an Oxygen concentrator for continuous Oxygen use . Non adherence to continuous daytime Oxygen therapy . Chronic Disease Management support and education needs related to End Stage Renal Disease on dialysis, Hypertension, Lung Mass  Nurse Case Manager Clinical Goal(s):  Marland Kitchen Over the next 30 days, patient will work with CCM RN and PCP provider to address needs related to obtaining an order and Care Coordination for an Oxygen Concentrator for continuous Oxygen therapy Goal Not Met . Over the next 90 days, patient will report 100% of adherence to wearing her Oxygen exactly as prescribed . 02/10/19 New Over the next 14 days, patient will keep appointment with Oncologist, Dr. Julien Nordmann for evaluation of shortness of breath and continuous Oxygen use  CCM RN CM Interventions:  02/10/19 call completed with daughter Jessica Clarke   . Evaluation of current treatment plan related to shortness of breath not related to ESRD and the need for continuous Oxygen therapy and patient's adherence to plan as established by provider. . Advised patient to ask her Oncologist, Dr. Julien Nordmann at next scheduled visit set for 02/18/19 to evaluate and treat patient's ongoing shortness of breath; discussed Ms. Nearhood has not received the Oxygen concentrator from [place]; discussed Ms. Funk had been instructed by another physician in the past to wear her Oxygen continuously, prior to her lung CA diagnosis; discussed this advice was not followed up on and Ms. Nickey continues to have ongoing shortness of breath; determined daughter Jessica Clarke will be  accompanying Ms. Zuch to her Pulmonology appointment and will discuss this further with Dr. Julien Nordmann . Discussed plans with patient for ongoing care management follow up and provided patient with direct contact information for care management team  Patient Self Care Activities:  . Patient verbalizes understanding of plan to ask Oncologist, Dr. Julien Nordmann evaluate and treat patient's ongoing shortness of breath . Self administers medications as prescribed . Attends all scheduled provider appointments . Calls pharmacy for medication refills . Performs ADL's independently . Performs IADL's independently . Calls provider office for new concerns or questions  Please see past updates related to this goal by clicking on the "Past Updates" button in the selected goal        Telephone follow up appointment with care management team member scheduled for: 02/21/19  Barb Merino, RN, BSN, CCM Care Management Coordinator [facility]  Direct Phone: [phone]

## 2019-02-10 NOTE — Patient Instructions (Addendum)
Visit Information Visit Information  Goals Addressed      Patient Stated   . "I need a biopsy to determine what medication will help" (pt-stated)       Current Barriers:  . Limited testing to identify lesion in L lung . Knowledge Deficits related to evaluation and treatment of L lung mass . Chronic Disease Management support and education needs related to End Stage Renal Disease on dialysis, HTN, Lung Mass  Clinical Social Work Clinical Goal(s):  Marland Kitchen Over the next 30 days the patient will attend lung biopsy as scheduled on 12/04/18 to assist with tissue identification . Over the next 60 days the patient will become more knowledgeable of diagnosis as evidenced by ability to verbalize treatment plan  CCM RN CM Interventions: 02/10/19 call completed with patients daughter [name]  . Evaluation of current treatment plan related to Bronchoscopy for Lung Bx and patient's adherence to plan as established by provider . Determined the patient and daughter [name] have a good understanding of the patient's CA txt plan including radiation therapy and Chemo therapy . Determined the patient's daughter has no questions related to these recommendations at this time and will contact the Oncologist and or Cancer Ctr with questions . Discussed plans with patient for ongoing care management follow up and provided patient with direct contact information for care management team . Reviewed scheduled/upcoming provider appointments including: all upcoming appointments pertaining to patient's Cancer treatment   Patient Self Care Activities:  . Self administers medications as prescribed . Attends all scheduled provider appointments . Calls provider office for new concerns or questions  Please see past updates related to this goal by clicking on the "Past Updates" button in the selected goal       Other   . "to have fewer trips to the hospital"       Current Barriers:  Marland Kitchen Knowledge Deficits related to ESRD  including dietary and fluid restrictions to help maintain homeostasis . Chronic Disease Management support and education needs related to End Stage Renal Disease on dialysis, HTN, Lung Mass  Nurse Case Manager Clinical Goal(s):  Marland Kitchen Over the next 60 days, patient will verbalize understanding of plan for dietary and fluid restrictions as provided and directed by Nephrology and the the Hemodialysis nutrtiionist . Over the next 90 days, patient will not experience a hospital readmission secondary to ESRD and or Cardiopulmonary complication such as Pulmonary edema   CCM RN CM Interventions:  02/10/19 call completed with daughter [name] . Evaluation of current treatment plan related to ESRD/hemodialysis and patient's adherence to plan as established by provider. . Provided education to patient re: Pulmonary edema can occur if patient does not adhere to the fluid restriction provided by the dialysis staff/nutritionist . Discussed plans with patient for ongoing care management follow up and provided patient with direct contact information for care management team  Patient Self Care Activities:  . Self administers medications as prescribed . Attends all scheduled provider appointments . Calls pharmacy for medication refills . Calls provider office for new concerns or questions . Unable to perform ADLs independently . Unable to perform IADLs independently  Initial goal documentation    . "We haven't heard from the stomach doctor"       Daughter stated Current Barriers:  Marland Kitchen Knowledge Deficits related to treatment management of Gastrointestinal hemorrhage and suspicious Colon polyp . Chronic Disease Management support and education needs related to GI hemorrhage, End Stage Renal Disease on dialysis, HTN, Lung Mass  Nurse Case  Manager Clinical Goal(s):  Marland Kitchen Over the next 30 days, patient will have a new patient appointment with Gastroenterology for evaluation and treatment of recent GI hemorrhage and  Colon polyp COMPLETED . New 12/19/18 - Over the next 30 days, patient will have a good understanding of the plan for ongoing treatment management of her GI symptoms and will adhere to those recommendations  CCM RN CM Interventions:  12/27/18 call completed with patients daughter [name]   . Discussed patient's recent GI f/u to evaluate the prescribed plan of care; daughter [name] advised the patient had to provide new stool samples, she is unsure why due to not entering the OV with her mother . Reviewed and discussed it appears the previous stool samples were inconclusive therefore requiring new samples in order to determine if disease is present contributing to patient's symptoms . Discussed patient's symptoms have improved but have not resolved  . Reviewed the patient's next steps are to try Ibgard, in which samples were provided for bloating and can be purchased over-the-counter; the GI office will call you with results . Evaluation of current treatment plan related to GI symptoms and patient's adherence to plan as established by provider. . Discussed plans with patient for ongoing care management follow up and provided patient with direct contact information for care management team  Patient Self Care Activities:  . Unable to independently to manage Chronic conditions  Please see past updates related to this goal by clicking on the "Past Updates" button in the selected goal     . To assist patient with obtaining an Oxygen Concentrator       Current Barriers:  Marland Kitchen Knowledge Deficits related to how to obtain an Oxygen concentrator for continuous Oxygen use . Non adherence to continuous daytime Oxygen therapy . Chronic Disease Management support and education needs related to End Stage Renal Disease on dialysis, Hypertension, Lung Mass  Nurse Case Manager Clinical Goal(s):  Marland Kitchen Over the next 30 days, patient will work with CCM RN and PCP provider to address needs related to obtaining an order  and Care Coordination for an Oxygen Concentrator for continuous Oxygen therapy Goal Not Met . Over the next 90 days, patient will report 100% of adherence to wearing her Oxygen exactly as prescribed . 02/10/19 New Over the next 14 days, patient will keep appointment with Oncologist, Dr. Julien Nordmann for evaluation of shortness of breath and continuous Oxygen use  CCM RN CM Interventions:  02/10/19 call completed with daughter [name]   . Evaluation of current treatment plan related to shortness of breath not related to ESRD and the need for continuous Oxygen therapy and patient's adherence to plan as established by provider. . Advised patient to ask her Oncologist, Dr. Julien Nordmann at next scheduled visit set for 02/18/19 to evaluate and treat patient's ongoing shortness of breath; discussed Ms. Kendricks has not received the Oxygen concentrator from [place]; discussed Ms. Coonradt had been instructed by another physician in the past to wear her Oxygen continuously, prior to her lung CA diagnosis; discussed this advice was not followed up on and Ms. Prada continues to have ongoing shortness of breath; determined daughter [name] will be accompanying Ms. Haltiwanger to her Pulmonology appointment and will discuss this further with Dr. Julien Nordmann . Discussed plans with patient for ongoing care management follow up and provided patient with direct contact information for care management team  Patient Self Care Activities:  . Patient verbalizes understanding of plan to ask Oncologist, Dr. Julien Nordmann evaluate and treat patient's  ongoing shortness of breath . Self administers medications as prescribed . Attends all scheduled provider appointments . Calls pharmacy for medication refills . Performs ADL's independently . Performs IADL's independently . Calls provider office for new concerns or questions  Please see past updates related to this goal by clicking on the "Past Updates" button in the selected goal      The patient  verbalized understanding of instructions provided today and declined a print copy of patient instruction materials.   Telephone follow up appointment with care management team member scheduled for: 02/21/19  Barb Merino, RN, BSN, CCM Care Management Coordinator [place] [facility]  Direct Phone: [phone]

## 2019-02-11 ENCOUNTER — Ambulatory Visit: Payer: Medicare Other

## 2019-02-11 ENCOUNTER — Other Ambulatory Visit: Payer: Medicare Other

## 2019-02-11 ENCOUNTER — Telehealth: Payer: Self-pay

## 2019-02-11 NOTE — Telephone Encounter (Signed)
Transition Care Management Follow-up Telephone Call  Date of discharge and from where: 02/08/2019 from [facility]  How have you been since you were released from the hospital? Little better  Any questions or concerns? No  Items Reviewed:  Did the pt receive and understand the discharge instructions provided? Yes   Medications obtained and verified? Yes   Any new allergies since your discharge? No   Dietary orders reviewed? Yes  Do you have support at home? Yes   Other (ie: DME, Home Health, etc) none  Functional Questionnaire: (I = Independent and D = Dependent) ADL's: I  Bathing/Dressing- needs help bathing, but able dress self   Meal Prep- I  Eating- I  Maintaining continence- I  Transferring/Ambulation- I  Managing Meds- I   Follow up appointments reviewed:    PCP Hospital f/u appt confirmed? Yes  Scheduled to see [name] FNP on 02/17/2018 @ 12:00.  Are transportation arrangements needed? No   If their condition worsens, is the pt aware to call  their PCP or go to the ED? Yes  Was the patient provided with contact information for the PCP's office or ED? Yes  Was the pt encouraged to call back with questions or concerns? Yes

## 2019-02-11 NOTE — Chronic Care Management (AMB) (Signed)
  Chronic Care Management   Outreach Note  02/10/2019 Name: Jessica Clarke MRN: [id number] DOB: [birth year]  Referred by: [name], FNP Reason for referral : Care Coordination   SW placed a successful outbound call to the patients daughter and caregiver Jessica Clarke to assist with care coordination needs following hospitalization. Jessica Clarke reports patient doing well and in process of leaving for dialysis, Dialysis schedule remains Monday, Wednesday, Friday. The patient has plans to begin chemotherapy "next week" and will receive therapy every Tuesday for 6 weeks. Jessica Clarke discussed plans for daily radiation but is unclear duration of treatments.   SW reviewed opportunity to consult with cancer center social workers to assist with barriers surrounding transportation to treatment and cost of medications related to cancer diagnosis. Jessica Clarke verbalizes understanding. SW collaborated with RN Case Freight forwarder to provide update on patients plans.  Follow Up Plan: A member of the case management team will outreach the patient over the next 30 days.  Daneen Schick, BSW, CDP Social Worker, Certified Dementia Practitioner [place] [facility] [place] [facility] [phone]

## 2019-02-12 ENCOUNTER — Ambulatory Visit: Payer: Medicare Other | Admitting: Radiation Oncology

## 2019-02-12 ENCOUNTER — Telehealth: Payer: Self-pay

## 2019-02-12 ENCOUNTER — Ambulatory Visit: Payer: Self-pay

## 2019-02-12 ENCOUNTER — Ambulatory Visit: Payer: Medicare Other

## 2019-02-12 DIAGNOSIS — J811 Chronic pulmonary edema: Secondary | ICD-10-CM

## 2019-02-12 NOTE — Telephone Encounter (Signed)
Gave verbal ok to resume home health

## 2019-02-12 NOTE — Patient Instructions (Signed)
Visit Information  Goals Addressed            This Visit's Progress   . "to have fewer trips to the hospital"       Current Barriers:  Marland Kitchen Knowledge Deficits related to ESRD including dietary and fluid restrictions to help maintain homeostasis . Chronic Disease Management support and education needs related to End Stage Renal Disease on dialysis, HTN, Lung Mass  Nurse Case Manager Clinical Goal(s):  Marland Kitchen Over the next 60 days, patient will verbalize understanding of plan for dietary and fluid restrictions as provided and directed by Nephrology and the the Hemodialysis nutrtiionist . Over the next 90 days, patient will not experience a hospital readmission secondary to ESRD and or Cardiopulmonary complication such as Pulmonary edema   CCM RN CM Interventions:  02/10/19 call completed with daughter [name] . Evaluation of current treatment plan related to ESRD/hemodialysis and patient's adherence to plan as established by provider. . Provided education to patient re: Pulmonary edema can occur if patient does not adhere to the fluid restriction provided by the dialysis staff/nutritionist . Discussed plans with patient for ongoing care management follow up and provided patient with direct contact information for care management team 02/12/19 call completed with daughter [name] . Reached daughter [name] explained who I was and the nature of my call. . Discussed scheduled/upcoming appointments with the daughter . Discussed medications with the daughter and she states that she has her medications and she needs to pick  up a one from the pharmacy today. . Patient doing well no issues described at this time.   Patient Self Care Activities:  . Self administers medications as prescribed . Attends all scheduled provider appointments . Calls pharmacy for medication refills . Calls provider office for new concerns or questions . Unable to perform ADLs independently . Unable to perform IADLs  independently  Initial goal documentation        The patient verbalized understanding of instructions provided today and declined a print copy of patient instruction materials.   Face to Face appointment with care management team member RNCM Angel Little scheduled for: 02/21/19 The patient has been provided with contact information for the care management team and has been advised to call with any health related questions or concerns.   Lazaro Arms RN, BSN, [facility] Care Management Coordinator [place] Phone: [phone] Office: [phone] Fax: [phone]

## 2019-02-12 NOTE — Chronic Care Management (AMB) (Signed)
Chronic Care Management   Follow Up Note   02/12/2019 Name: Jessica Clarke MRN: [id number] DOB: 26-Mar-1950  Referred by: [name], FNP Reason for referral : Chronic Care Management (Care Management RNCM EMEI (General Discharge))   Jessica Clarke is a 68 y.o. year old female who is a primary care patient of [name], [place]. The CCM team was consulted for assistance with chronic disease management and care coordination needs.    Review of patient status, including review of consultants reports, relevant laboratory and other test results, and collaboration with appropriate care team members and the patient's provider was performed as part of comprehensive patient evaluation and provision of chronic care management services.    SDOH (Social Determinants of Health) screening performed today: None. See Care Plan for related entries.   Name: Jessica Clarke DOB: 2050-06-17; Age 38 Yesterday's Red Flags: 2   Assigned to [employer], RN w/ECM  GENERAL DISCHARGE FOR FOLLOW-UP [facility]-2W PROGRESSIVE CARE-GD    Encounter ID: [id number] Patient ID: [id number]    Series Access Code: [id number] Enrolled Date: 02/10/19     Call Language: English Start Date: 02/11/19     Details Tue 02/11/19      [phone] Day # 1      PATIENT GENERAL DISCHARGE FOR FOLLOW-UP: DISCHARGE, FOLLOW-UP, MEDS, WOUND CARE      Time of Last Call Attempt 10:16 AM      Who reached Patient      Scheduled follow-up? No      Unfilled prescriptions? Yes      Emmi Program: PATIENT SATISFACTION: AFTER DISCHARGE EXPECTATIONS Issued        Outpatient Encounter Medications as of 02/12/2019  Medication Sig  . acetaminophen (TYLENOL) 325 MG tablet Take 325-650 mg by mouth every 8 (eight) hours as needed for mild pain.   Marland Kitchen allopurinol (ZYLOPRIM) 100 MG tablet Take 100 mg by mouth daily.    . Amino Acids-Protein Hydrolys (FEEDING SUPPLEMENT, PRO-STAT SUGAR FREE 64,) LIQD Take 30 mLs by mouth 2 (two) times daily.  Marland Kitchen atorvastatin  (LIPITOR) 10 MG tablet Take 10 mg by mouth at bedtime.   . B Complex-Folic Acid (B COMPLEX FORMULA 1) TABS Take 1 tablet by mouth daily.   . calcitRIOL (ROCALTROL) 0.5 MCG capsule Take 2 capsules (1 mcg total) by mouth every Monday, Wednesday, and Friday with hemodialysis.  Marland Kitchen cinacalcet (SENSIPAR) 30 MG tablet Take 1 tablet (30 mg total) by mouth daily with supper.  . colchicine 0.6 MG tablet Take 0.6 mg by mouth daily as needed (as directed for gout flares or pain).   . Darbepoetin Alfa (ARANESP) 60 MCG/0.3ML SOSY injection Inject 0.3 mLs (60 mcg total) into the vein every Friday with hemodialysis.  Marland Kitchen loperamide (IMODIUM) 2 MG capsule Take 2 mg by mouth daily as needed for diarrhea or loose stools.   . midodrine (PROAMATINE) 10 MG tablet Take 1 tablet (10 mg total) by mouth 2 (two) times daily with a meal.  . multivitamin (RENA-VIT) TABS tablet Take 1 tablet by mouth at bedtime.  . ondansetron (ZOFRAN ODT) 4 MG disintegrating tablet 4mg  ODT q4 hours prn nausea/vomit  . prochlorperazine (COMPAZINE) 10 MG tablet Take 1 tablet (10 mg total) by mouth every 6 (six) hours as needed for nausea or vomiting.  . thiamine (VITAMIN B-1) 100 MG tablet Take 100 mg by mouth daily.  . VELPHORO 500 MG chewable tablet Chew 1,000 mg by mouth 3 (three) times daily with meals.   . Vitamin D, Cholecalciferol, 25  MCG (1000 UT) TABS Take 1,000 Units by mouth daily.    No facility-administered encounter medications on file as of 02/12/2019.    Goals Addressed            This Visit's Progress   . "to have fewer trips to the hospital"       Current Barriers:  Marland Kitchen Knowledge Deficits related to ESRD including dietary and fluid restrictions to help maintain homeostasis . Chronic Disease Management support and education needs related to End Stage Renal Disease on dialysis, HTN, Lung Mass  Nurse Case Manager Clinical Goal(s):  Marland Kitchen Over the next 60 days, patient will verbalize understanding of plan for dietary and fluid  restrictions as provided and directed by Nephrology and the the Hemodialysis nutrtiionist . Over the next 90 days, patient will not experience a hospital readmission secondary to ESRD and or Cardiopulmonary complication such as Pulmonary edema   CCM RN CM Interventions:  02/10/19 call completed with daughter [name] . Evaluation of current treatment plan related to ESRD/hemodialysis and patient's adherence to plan as established by provider. . Provided education to patient re: Pulmonary edema can occur if patient does not adhere to the fluid restriction provided by the dialysis staff/nutritionist . Discussed plans with patient for ongoing care management follow up and provided patient with direct contact information for care management team 02/12/19 call completed with daughter [name] . Reached daughter [name] explained who I was and the nature of my call. . Discussed scheduled/upcoming appointments with the daughter . Discussed medications with the daughter and she states that she has her medications and she needs to pick  up a one from the pharmacy today. . Patient doing well no issues described at this time.   Patient Self Care Activities:  . Self administers medications as prescribed . Attends all scheduled provider appointments . Calls pharmacy for medication refills . Calls provider office for new concerns or questions . Unable to perform ADLs independently . Unable to perform IADLs independently  Initial goal documentation         Telephone follow up appointment with care management team member [place] scheduled for:02/21/19 The patient has been provided with contact information for the care management team and has been advised to call with any health related questions or concerns.    Lazaro Arms RN, BSN, [facility] Care Management Coordinator [place] Phone: [phone] Office: [phone] Fax: [phone]

## 2019-02-12 NOTE — Progress Notes (Signed)
This encounter was created in error - please disregard.

## 2019-02-13 ENCOUNTER — Other Ambulatory Visit: Payer: Self-pay

## 2019-02-13 ENCOUNTER — Ambulatory Visit
Admission: RE | Admit: 2019-02-13 | Discharge: 2019-02-13 | Disposition: A | Discharge status: Home / Self Care | Payer: Medicare Other | Source: Ambulatory Visit | Attending: Radiation Oncology | Admitting: Radiation Oncology

## 2019-02-13 ENCOUNTER — Encounter: Payer: Self-pay | Admitting: Radiation Oncology

## 2019-02-13 VITALS — BP 119/66 | HR 68 | Temp 98.0°F | Resp 24 | Wt 152.2 lb

## 2019-02-13 DIAGNOSIS — E785 Hyperlipidemia, unspecified: Secondary | ICD-10-CM | POA: Diagnosis not present

## 2019-02-13 DIAGNOSIS — Z79899 Other long term (current) drug therapy: Secondary | ICD-10-CM | POA: Insufficient documentation

## 2019-02-13 DIAGNOSIS — C3491 Malignant neoplasm of unspecified part of right bronchus or lung: Secondary | ICD-10-CM

## 2019-02-13 DIAGNOSIS — N186 End stage renal disease: Secondary | ICD-10-CM | POA: Insufficient documentation

## 2019-02-13 DIAGNOSIS — K5900 Constipation, unspecified: Secondary | ICD-10-CM | POA: Diagnosis not present

## 2019-02-13 DIAGNOSIS — Z800 Family history of malignant neoplasm of digestive organs: Secondary | ICD-10-CM | POA: Diagnosis not present

## 2019-02-13 DIAGNOSIS — I509 Heart failure, unspecified: Secondary | ICD-10-CM | POA: Insufficient documentation

## 2019-02-13 DIAGNOSIS — I252 Old myocardial infarction: Secondary | ICD-10-CM | POA: Insufficient documentation

## 2019-02-13 DIAGNOSIS — I2510 Atherosclerotic heart disease of native coronary artery without angina pectoris: Secondary | ICD-10-CM | POA: Diagnosis not present

## 2019-02-13 DIAGNOSIS — E669 Obesity, unspecified: Secondary | ICD-10-CM | POA: Diagnosis not present

## 2019-02-13 DIAGNOSIS — J90 Pleural effusion, not elsewhere classified: Secondary | ICD-10-CM | POA: Diagnosis not present

## 2019-02-13 DIAGNOSIS — Z803 Family history of malignant neoplasm of breast: Secondary | ICD-10-CM | POA: Diagnosis not present

## 2019-02-13 DIAGNOSIS — C3411 Malignant neoplasm of upper lobe, right bronchus or lung: Secondary | ICD-10-CM | POA: Diagnosis present

## 2019-02-13 DIAGNOSIS — K219 Gastro-esophageal reflux disease without esophagitis: Secondary | ICD-10-CM | POA: Diagnosis not present

## 2019-02-13 DIAGNOSIS — M109 Gout, unspecified: Secondary | ICD-10-CM | POA: Insufficient documentation

## 2019-02-13 DIAGNOSIS — D649 Anemia, unspecified: Secondary | ICD-10-CM | POA: Diagnosis not present

## 2019-02-13 DIAGNOSIS — I132 Hypertensive heart and chronic kidney disease with heart failure and with stage 5 chronic kidney disease, or end stage renal disease: Secondary | ICD-10-CM | POA: Insufficient documentation

## 2019-02-13 DIAGNOSIS — F17210 Nicotine dependence, cigarettes, uncomplicated: Secondary | ICD-10-CM | POA: Insufficient documentation

## 2019-02-13 DIAGNOSIS — K5730 Diverticulosis of large intestine without perforation or abscess without bleeding: Secondary | ICD-10-CM | POA: Insufficient documentation

## 2019-02-13 DIAGNOSIS — Z992 Dependence on renal dialysis: Secondary | ICD-10-CM | POA: Diagnosis not present

## 2019-02-13 LAB — HM DIABETES EYE EXAM

## 2019-02-13 NOTE — Progress Notes (Signed)
Thoracic Location of Tumor / Histology: DIAGNOSIS: Stage IIIanon-small cell lung cancer, adenocarcinoma presented with right upper lobe lung mass in addition to right hilar and mediastinal lymphadenopathy.  Patient presented with symptoms of:The patient was lost to follow-up since January 2020.  She had a PET scan that was suspicious for right upper lobe primary lung malignancy.  The patient was supposed to have pulmonary function test and follow-up after her last visit in January but she was lost to follow-up since that time.     Biopsies revealed: 12/19/18:  CYTOLOGY - NON PAP  CASE: [id number]  PATIENT: Jessica Clarke  Non-Gynecological Cytology Report  Clinical History: Mass of upper lobe of right lung, Mediastinal  adenopathy, Hilar adenopathy  FINAL MICROSCOPIC DIAGNOSIS:   A. LYMPH NODE, 7, FINE NEEDLE ASPIRATION:  - No malignant cells identified   B. LYMPH NODE, 4R, FINE NEEDLE ASPIRATION:  - Metastatic carcinoma  Tobacco/Marijuana/Snuff/ETOH use:  Tobacco Use  . Smoking status: Current Every Day Smoker    Packs/day: 0.50    Years: 30.00    Pack years: 15.00    Types: Cigarettes  . Smokeless tobacco: Never Used  Substance Use Topics  . Alcohol use: Yes    Alcohol/week: 6.0 standard drinks    Types: 6 Cans of beer per week  . Drug use: No      Past/Anticipated interventions by medical oncology, if any: Per Dr. Julien Nordmann 02/04/19:  Hematology/Oncology Attending: I had a face-to-face encounter with the patient today.  I recommended her care plan.  This is a very pleasant 68 years old African-American female with end-stage renal disease and currently on hemodialysis.  The patient was also recently diagnosed with a stage IIIa non-small cell lung cancer, adenocarcinoma.  She was supposed to start a course of concurrent chemoradiation but unfortunately she has not seen the radiation oncologist yet and she is scheduled to have an appointment with Dr. Lisbeth Renshaw next  week. She was supposed to start the first cycle of her treatment with chemotherapy today. I recommended for the patient to delay the start of her chemotherapy until she is ready to start radiation therapy in early January 2020. For the end-stage renal disease, she will continue on hemodialysis. For the epistaxis, she is scheduled to see ENT for evaluation. The patient was advised to call immediately if she has any concerning symptoms in the interval  Signs/Symptoms  Pt reports cough, light brown to clear phlegm. Pt reports occasional nose bleed with "hard" coughing. Denies hemoptysis. Pt reports SOB with exertion. Daughter reports pt with difficulty catching breath. Pt denies difficulty swallowing.  SAFETY ISSUES:  Prior radiation? No  Pacemaker/ICD? No   Possible current pregnancy?No  Is the patient on methotrexate? No  Current Complaints / other details:  Pt presents today for initial consult with Dr. Sondra Come for Radiation Oncology. Pt is accompanied by daughter, [name].  BP 119/66 (BP Location: Right Arm, Patient Position: Sitting, Cuff Size: Normal)   Pulse 68   Temp 98 F (36.7 C)   Resp (!) 24   Wt 152 lb 3.2 oz (69 kg)   SpO2 91%   BMI 21.23 kg/m   Wt Readings from Last 3 Encounters:  02/13/19 152 lb 3.2 oz (69 kg)  02/08/19 153 lb 3.5 oz (69.5 kg)  02/04/19 143 lb 14.4 oz (65.3 kg)   Loma Sousa, RN BSN

## 2019-02-13 NOTE — Progress Notes (Signed)
Radiation Oncology         [phone] ________________________________  Initial Outpatient Consultation  Name: Jessica Clarke MRN: [id number]  Date: 02/13/2019  DOB: Feb 13, 1951  CC:Jessica Brine, FNP  Jessica Clarke*   REFERRING PHYSICIAN: Heilingoetter, Clarke*  DIAGNOSIS: The encounter diagnosis was Adenocarcinoma of right lung, stage 3 ([place]).  Stage IIIanon-small cell lung cancer, adenocarcinoma presented with right upper lobe lung mass in addition toright hilar andmediastinal lymphadenopathy. Diagnosed in November 2020.  HISTORY OF PRESENT ILLNESS::Jessica Clarke is a 68 y.o. female who is accompanied by her daughter, Jessica Clarke. The patient presented to her PCP in 12/2017 with cough and shortness of breath. She underwent chest CT on 12/25/17 that showed a 2.1 cm spiculated nodule in the apical segment right upper lobe with a 1.3 cm mediastinal lymph node. She was referred to Dr. Julien Nordmann on 02/04/2018 and underwent PET scan on 02/26/2018, which showed right upper lobe primary bronchogenic carcinoma and indeterminate hypermetabolic mediastinum lymph nodes. At that point, she was lost to follow up. Of note, she underwent brain MRI on 10/24/2018 following a fall in which she hit her head, which was negative for metastatic disease.  She returned to Dr. Julien Nordmann on 10/28/2018 and underwent repeat chest CT on 11/12/2018, which revealed growth of the RUL mass, now measuring 3.1 cm, and progression of right paratracheal lymphadenopathy. She was referred to Dr. Valeta Harms on 12/12/2018 and proceeded to bronchoscopy on 12/19/2018. Pathology from the procedure revealed metastatic adenocarcinoma, positive for TTF-1 but negative for cytokeratin 5/6 and p40, in a 4R lymph node.  Repeat PET scan performed on 01/16/2019 showed: RUL lesion decreased in size to 2.3 cm remains hypermetabolic; significant increased size and activity in the right paratracheal and hilar adenopathy. She is scheduled to begin  concurrent chemotherapy consisting of carboplatin and paclitaxel on 02/18/2019.   PREVIOUS RADIATION THERAPY: No  PAST MEDICAL HISTORY:  Past Medical History:  Diagnosis Date  . Abnormal Pap smear    ASCUS ?HGSIL  . Anemia   . Arthritis   . Cancer ([place])    lung ( per daughter)  . Cervical dysplasia   . CHF (congestive heart failure) ([place])   . Chronic kidney disease   . Constipation   . Constipation   . Dyspnea   . GERD (gastroesophageal reflux disease)   . Gout   . Hyperlipidemia   . Hypertension   . Myocardial infarction ([place]) 2015  . Obesity   . Pneumonia   . Uterine polyp     PAST SURGICAL HISTORY: Past Surgical History:  Procedure Laterality Date  . AV FISTULA PLACEMENT  01/18/2012   Procedure: ARTERIOVENOUS (AV) FISTULA CREATION;  Surgeon: Angelia Mould, MD;  Location: [place];  Service: Vascular;  Laterality: Left;  . BIOPSY  10/20/2018   Procedure: BIOPSY;  Surgeon: Thornton Park, MD;  Location: [place];  Service: Gastroenterology;;  . COLONOSCOPY WITH PROPOFOL N/A 10/20/2018   Procedure: COLONOSCOPY WITH PROPOFOL;  Surgeon: Thornton Park, MD;  Location: [place];  Service: Gastroenterology;  Laterality: N/A;  . DILATION AND CURETTAGE OF UTERUS    . HYSTEROSCOPY    . LEEP    . LEFT HEART CATHETERIZATION WITH CORONARY ANGIOGRAM N/A 08/26/2013   Procedure: LEFT HEART CATHETERIZATION WITH CORONARY ANGIOGRAM;  Surgeon: Leonie Man, MD;  Location: [facility];  Service: Cardiovascular;  Laterality: N/A;  . POLYPECTOMY  10/20/2018   Procedure: POLYPECTOMY;  Surgeon: Thornton Park, MD;  Location: [place];  Service: Gastroenterology;;  . REVISON OF ARTERIOVENOUS FISTULA  Left 5/64/3329   Procedure: PLICATION OF ARTERIOVENOUS FISTULA;  Surgeon: Conrad  Chapel, MD;  Location: [place];  Service: Vascular;  Laterality: Left;  Marland Kitchen VIDEO BRONCHOSCOPY WITH ENDOBRONCHIAL ULTRASOUND N/A 12/19/2018   Procedure: VIDEO BRONCHOSCOPY WITH ENDOBRONCHIAL ULTRASOUND;   Surgeon: Garner Nash, DO;  Location: [facility];  Service: Thoracic;  Laterality: N/A;    FAMILY HISTORY:  Family History  Problem Relation Age of Onset  . Esophageal cancer Other   . Pancreatic cancer Other   . Heart disease Mother   . Hypertension Mother   . Breast cancer Sister   . Esophageal cancer Sister   . Cancer Sister   . Deep vein thrombosis Sister   . Diabetes Sister   . Hypertension Sister   . Hypertension Daughter   . Colon cancer Neg Hx     SOCIAL HISTORY:  Social History   Tobacco Use  . Smoking status: Current Every Day Smoker    Packs/day: 0.25    Years: 30.00    Pack years: 7.50    Types: Cigarettes  . Smokeless tobacco: Never Used  . Tobacco comment: smoking cessation  Substance Use Topics  . Alcohol use: Not Currently  . Drug use: No    ALLERGIES: No Known Allergies  MEDICATIONS:  Current Outpatient Medications  Medication Sig Dispense Refill  . acetaminophen (TYLENOL) 325 MG tablet Take 325-650 mg by mouth every 8 (eight) hours as needed for mild pain.     Marland Kitchen allopurinol (ZYLOPRIM) 100 MG tablet Take 100 mg by mouth daily.      . Amino Acids-Protein Hydrolys (FEEDING SUPPLEMENT, PRO-STAT SUGAR FREE 64,) LIQD Take 30 mLs by mouth 2 (two) times daily. 887 mL 0  . atorvastatin (LIPITOR) 10 MG tablet Take 10 mg by mouth at bedtime.   0  . B Complex-Folic Acid (B COMPLEX FORMULA 1) TABS Take 1 tablet by mouth daily.     . calcitRIOL (ROCALTROL) 0.5 MCG capsule Take 2 capsules (1 mcg total) by mouth every Monday, Wednesday, and Friday with hemodialysis. 30 capsule 0  . cinacalcet (SENSIPAR) 30 MG tablet Take 1 tablet (30 mg total) by mouth daily with supper. 60 tablet 0  . colchicine 0.6 MG tablet Take 0.6 mg by mouth daily as needed (as directed for gout flares or pain).     . Darbepoetin Alfa (ARANESP) 60 MCG/0.3ML SOSY injection Inject 0.3 mLs (60 mcg total) into the vein every Friday with hemodialysis. 4.2 mL   . loperamide (IMODIUM) 2 MG capsule  Take 2 mg by mouth daily as needed for diarrhea or loose stools.     . midodrine (PROAMATINE) 10 MG tablet Take 1 tablet (10 mg total) by mouth 2 (two) times daily with a meal. 60 tablet 0  . multivitamin (RENA-VIT) TABS tablet Take 1 tablet by mouth at bedtime. 30 tablet 0  . ondansetron (ZOFRAN ODT) 4 MG disintegrating tablet 4mg  ODT q4 hours prn nausea/vomit 10 tablet 0  . prochlorperazine (COMPAZINE) 10 MG tablet Take 1 tablet (10 mg total) by mouth every 6 (six) hours as needed for nausea or vomiting. 30 tablet 1  . thiamine (VITAMIN B-1) 100 MG tablet Take 100 mg by mouth daily.    . VELPHORO 500 MG chewable tablet Chew 1,000 mg by mouth 3 (three) times daily with meals.     . Vitamin D, Cholecalciferol, 25 MCG (1000 UT) TABS Take 1,000 Units by mouth daily.      No current facility-administered medications for this encounter.  REVIEW OF SYSTEMS:  A 10+ POINT REVIEW OF SYSTEMS WAS OBTAINED including neurology, dermatology, psychiatry, cardiac, respiratory, lymph, extremities, GI, GU, musculoskeletal, constitutional, reproductive, HEENT. She reports productive cough with light brown to clear phlegm and occasional nose bleed with "hard" coughing. She denies hemoptysis and difficulty swallowing. She reports shortness of breath with exertion, and her daughter adds the patient has difficulty catching her breath.    PHYSICAL EXAM:  weight is 152 lb 3.2 oz (69 kg). Her temperature is 98 F (36.7 C). Her blood pressure is 119/66 and her pulse is 68. Her respiration is 24 (abnormal) and oxygen saturation is 91%.   General: Alert and oriented, in no acute distress HEENT: Head is normocephalic. Extraocular movements are intact. Oropharynx is clear. Neck: Neck is supple, no palpable cervical or supraclavicular lymphadenopathy. Heart: Regular in rate and rhythm with no murmurs, rubs, or gallops. Chest: Clear to auscultation bilaterally, with no rhonchi, wheezes, or rales. Abdomen: Soft, nontender,  nondistended, with no rigidity or guarding. Extremities: No cyanosis or edema. Lymphatics: see Neck Exam Skin: No concerning lesions. Musculoskeletal: symmetric strength and muscle tone throughout. Neurologic: Cranial nerves II through XII are grossly intact. No obvious focalities. Speech is fluent. Coordination is intact. Psychiatric: Judgment and insight are intact. Affect is appropriate. Patient has a fistula in place in her left mid arm for dialysis access  ECOG = 2  0 - Asymptomatic (Fully active, able to carry on all predisease activities without restriction)  1 - Symptomatic but completely ambulatory (Restricted in physically strenuous activity but ambulatory and able to carry out work of a light or sedentary nature. For example, light housework, office work)  2 - Symptomatic, <50% in bed during the day (Ambulatory and capable of all self care but unable to carry out any work activities. Up and about more than 50% of waking hours)  3 - Symptomatic, >50% in bed, but not bedbound (Capable of only limited self-care, confined to bed or chair 50% or more of waking hours)  4 - Bedbound (Completely disabled. Cannot carry on any self-care. Totally confined to bed or chair)  5 - Death   Eustace Pen MM, Creech RH, Tormey DC, et al. [phone]). "Toxicity and response criteria of the [facility]". [place] Oncol. 5 (6): 649-55  LABORATORY DATA:  Lab Results  Component Value Date   WBC 4.9 02/08/2019   HGB 7.9 (L) 02/08/2019   HCT 24.8 (L) 02/08/2019   MCV 95.8 02/08/2019   PLT 155 02/08/2019   NEUTROABS 4.8 02/05/2019   Lab Results  Component Value Date   NA 129 (L) 02/08/2019   K 3.7 02/08/2019   CL 93 (L) 02/08/2019   CO2 24 02/08/2019   GLUCOSE 79 02/08/2019   CREATININE 4.90 (H) 02/08/2019   CALCIUM 7.4 (L) 02/08/2019      RADIOGRAPHY: DG Chest 2 View  Result Date: 02/05/2019 CLINICAL DATA:  68 year old female with nausea vomiting and pleuritic chest  pain. EXAM: CHEST - 2 VIEW COMPARISON:  Portable chest 10/22/2018 and earlier. CT chest 11/12/2018. FINDINGS: Spiculated right upper lobe lung mass demonstrated by CT has been poorly visible radiographically but might have regressed. Right pleural effusion probably persists, small. Stable cardiomegaly and mediastinal contours. No pneumothorax. No definite left pleural effusion. Pulmonary vascularity is unchanged with symmetric vascular congestion suspected. No new confluent opacity. Negative visible bowel gas pattern. No acute osseous abnormality identified. IMPRESSION: 1. Spiculated right upper lobe lung mass has been better demonstrated by CT. 2. Cardiomegaly  with continued pulmonary vascular congestion suspicious for mild interstitial edema. Small right pleural effusion suspected. 3. No pneumothorax or other acute cardiopulmonary abnormality. Electronically Signed   By: Genevie Ann M.D.   On: 02/05/2019 13:24   NM PET Image Restag (PS) Skull Base To Thigh  Result Date: 01/16/2019 CLINICAL DATA:  Subsequent treatment strategy for non-small cell lung cancer. EXAM: NUCLEAR MEDICINE PET SKULL BASE TO THIGH TECHNIQUE: 8.8 mCi F-18 FDG was injected intravenously. Full-ring PET imaging was performed from the skull base to thigh after the radiotracer. CT data was obtained and used for attenuation correction and anatomic localization. Fasting blood glucose: 76 mg/dl COMPARISON:  CT chest 11/12/2018 and PET-CT from 02/16/2018 FINDINGS: Mediastinal blood pool activity: SUV max 1.8 Liver activity: SUV max 2.5 NECK: No significant abnormal hypermetabolic activity in this region. Incidental CT findings: None CHEST: Right paratracheal node 2.1 cm in short axis on image 58/4 with maximum SUV 11.7, previous measurements 1.0 cm on 02/26/2018 and 1.9 cm on 11/12/2018 with previous maximum SUV 5.6. Right hilar lymph node 2.0 cm in short axis on image 67/4, previously 1.8 cm on 11/12/2018 and previously 1.5 cm on 02/26/2018. Current  maximum SUV 12.2, previously 4.4 on 02/26/2018. Right upper lobe pulmonary nodule with spiculated margins measuring 2.3 by 1.6 cm on image 16/8, previously cavitary and measuring 2.5 by 1.9 cm on 11/12/2018 and 2.0 by 1.6 cm on 02/26/2018. Current maximum SUV 6.0, previous maximum SUV 9.9. Faintly accentuated activity along AP window lymph nodes, difficult to measure due to indistinct margins but up to about 1.1 cm in thickness on image 63/4, maximum SUV 4.4, previously 3.5. Incidental CT findings: Coronary, aortic arch, and branch vessel atherosclerotic vascular disease. Moderate cardiomegaly. Mitral valve calcification. Small right pleural effusion similar to 11/12/2018. Paraseptal emphysema. Mild interstitial accentuation in both lungs. ABDOMEN/PELVIS: Sigmoid diverticulosis with local likely physiologic activity along the sigmoid colon with maximum SUV 7.4, previously 4.6 in this region. Incidental CT findings: Ascites with diffuse third spacing of fluid in the subcutaneous tissues and mesentery. Severe bilateral renal atrophy. Aortoiliac atherosclerotic vascular disease. Sigmoid colon diverticulosis. Low-grade accentuated metabolic activity associated with the diffuse third spacing of fluid. SKELETON: Faintly accentuated activity along the left facet joint at about the C3-4 level, probably from facet arthropathy, the maximum SUV is only 4.2. No definite worrisome activity identified. Incidental CT findings: Cervical and lumbar spondylosis. IMPRESSION: 1. Although the primary right upper lobe lesion is smaller than it was on 11/12/2018 at current measurement of 2.3 by 1.6 cm, the lesion is hypermetabolic with maximum SUV of 6.0 compatible with malignancy. Prior SUV was 9.9. 2. In contrast to the reduction in activity in the nodule, there is significant increased size and activity in the right paratracheal and right hilar adenopathy compatible with progressive malignancy. 3. Continued low-grade activity along  borderline enlarged AP window lymph nodes. Current maximum SUV is 4.4, previously 3.5. Contralateral mediastinal nodal metastatic disease is not excluded given this appearance. 4. No persuasive evidence of metastatic disease to the neck, abdomen/pelvis, or skeleton. 5. Other imaging findings of potential clinical significance: Aortic Atherosclerosis (ICD10-I70.0). Coronary atherosclerosis. Mild cardiomegaly. Mitral valve calcification. Stable small right pleural effusion. Paraseptal emphysema. Chronic interstitial accentuation in the lungs. Sigmoid diverticulosis. Ascites. Third spacing of fluid in the subcutaneous tissues and mesentery. Severe bilateral renal atrophy. Sigmoid colon diverticulosis. Electronically Signed   By: Van Clines M.D.   On: 01/16/2019 17:19   CT Renal Stone Study  Result Date: 02/05/2019 CLINICAL DATA:  Flank pain, generalized weakness EXAM: CT ABDOMEN AND PELVIS WITHOUT CONTRAST TECHNIQUE: Multidetector CT imaging of the abdomen and pelvis was performed following the standard protocol without IV contrast. COMPARISON:  None. FINDINGS: Lower chest: Small right pleural effusion. No left pleural effusion. Cardiomegaly. Hepatobiliary: No focal hepatic mass. Calcification along the inferior margin of the gallbladder which may reflect a gallstone or volume averaging from the adjacent small bowel. No intrahepatic biliary ductal dilatation. Pancreas: Unremarkable. No pancreatic ductal dilatation or surrounding inflammatory changes. Spleen: Normal in size without focal abnormality. Adrenals/Urinary Tract: Indeterminate 9 mm left adrenal nodule. Severe atrophy of bilateral kidneys. No urolithiasis or obstructive uropathy. Decompressed bladder. Stomach/Bowel: No bowel dilatation to suggest obstruction. Mild small bowel wall thickening in the left side of the abdomen as can be seen with enteritis secondary to an infectious or inflammatory etiology. No pneumatosis, pneumoperitoneum or portal  venous gas. Diverticulosis without evidence of diverticulitis. Vascular/Lymphatic: Normal caliber abdominal aorta with mild atherosclerosis. No lymphadenopathy. Reproductive: Status post hysterectomy. No adnexal masses. Other: No fluid collection or hematoma.  Anasarca is present. Musculoskeletal: No acute osseous abnormality. No aggressive osseous lesion. Degenerative disease with disc height loss throughout the lumbar spine. Schmorl's node at L3 and L4. IMPRESSION: 1. Mild small bowel wall thickening in the left side of the abdomen as can be seen with enteritis secondary to an infectious or inflammatory etiology. 2. Generalized anasarca. 3. Small right pleural effusion. 4. Diverticulosis without evidence of diverticulitis. 5. No bowel obstruction. Electronically Signed   By: Kathreen Devoid   On: 02/05/2019 14:51      IMPRESSION: Stage IIIanon-small cell lung cancer, adenocarcinoma presented with right upper lobe lung mass in addition toright hilar andmediastinal lymphadenopathy. Diagnosed in November 2020.  The patient would be a good candidate for definitive course of radiation therapy along with radiosensitizing chemotherapy.  Patient however does have end-stage renal disease and I think fatigue will be a major issue for her especially with ongoing dialysis 3 times a week.  Today, I talked to the patient and family about the findings and work-up thus far.  We discussed the natural history of non-small cell lung cancer and general treatment, highlighting the role of radiotherapy in the management.  We discussed the available radiation techniques, and focused on the details of logistics and delivery.  We reviewed the anticipated acute and late sequelae associated with radiation in this setting.  The patient was encouraged to ask questions that I answered to the best of my ability.  A patient consent form was discussed and signed.  We retained a copy for our records.  The patient would like to proceed with  radiation and will be scheduled for CT simulation.  PLAN: Patient will return on Monday 02/17/2019 for CT simulation with treatments to begin later next week concomitant with radiosensitizing chemotherapy.  Anticipate 6 weeks of radiation therapy.    ------------------------------------------------  Blair Promise, PhD, MD  This document serves as a record of services personally performed by Gery Pray, MD. It was created on his behalf by Wilburn Mylar, a trained medical scribe. The creation of this record is based on the scribe's personal observations and the provider's statements to them. This document has been checked and approved by the attending provider.

## 2019-02-13 NOTE — Patient Instructions (Signed)
Coronavirus (COVID-19) Are you at risk?  Are you at risk for the Coronavirus (COVID-19)?  To be considered HIGH RISK for Coronavirus (COVID-19), you have to meet the following criteria:  . Traveled to [place], [place], [place], [place] or [place]; or in the [place] to [place], [place], [place], or [place]; and have fever, cough, and shortness of breath within the last 2 weeks of travel OR . Been in close contact with a person diagnosed with COVID-19 within the last 2 weeks and have fever, cough, and shortness of breath . IF YOU DO NOT MEET THESE CRITERIA, YOU ARE CONSIDERED LOW RISK FOR COVID-19.  What to do if you are HIGH RISK for COVID-19?  . If you are having a medical emergency, call 911. . Seek medical care right away. Before you go to a doctor's office, urgent care or emergency department, call ahead and tell them about your recent travel, contact with someone diagnosed with COVID-19, and your symptoms. You should receive instructions from your physician's office regarding next steps of care.  . When you arrive at healthcare provider, tell the healthcare staff immediately you have returned from visiting [place], [place], [place], [place] or [place]; or traveled in the [place] to [place], [place], [place], or [place]; in the last two weeks or you have been in close contact with a person diagnosed with COVID-19 in the last 2 weeks.   . Tell the health care staff about your symptoms: fever, cough and shortness of breath. . After you have been seen by a medical provider, you will be either: o Tested for (COVID-19) and discharged home on quarantine except to seek medical care if symptoms worsen, and asked to  - Stay home and avoid contact with others until you get your results (4-5 days)  - Avoid travel on public transportation if possible (such as bus, train, or airplane) or o Sent to the Emergency Department by EMS for evaluation, COVID-19 testing, and possible  admission depending on your condition and test results.  What to do if you are LOW RISK for COVID-19?  Reduce your risk of any infection by using the same precautions used for avoiding the common cold or flu:  . Wash your hands often with soap and warm water for at least 20 seconds.  If soap and water are not readily available, use an alcohol-based hand sanitizer with at least 60% alcohol.  . If coughing or sneezing, cover your mouth and nose by coughing or sneezing into the elbow areas of your shirt or coat, into a tissue or into your sleeve (not your hands). . Avoid shaking hands with others and consider head nods or verbal greetings only. . Avoid touching your eyes, nose, or mouth with unwashed hands.  . Avoid close contact with people who are sick. . Avoid places or events with large numbers of people in one location, like concerts or sporting events. . Carefully consider travel plans you have or are making. . If you are planning any travel outside or inside the [place], visit the CDC's Travelers' Health webpage for the latest health notices. . If you have some symptoms but not all symptoms, continue to monitor at home and seek medical attention if your symptoms worsen. . If you are having a medical emergency, call 911.   ADDITIONAL HEALTHCARE OPTIONS FOR PATIENTS  [place] Tele[facility] / e-Visit: [email or web address]         [facility]: [phone]  [facility]: [phone]                   [facility]: [phone]   

## 2019-02-14 DIAGNOSIS — N186 End stage renal disease: Secondary | ICD-10-CM | POA: Diagnosis not present

## 2019-02-14 DIAGNOSIS — Z992 Dependence on renal dialysis: Secondary | ICD-10-CM | POA: Diagnosis not present

## 2019-02-14 DIAGNOSIS — N2889 Other specified disorders of kidney and ureter: Secondary | ICD-10-CM | POA: Diagnosis not present

## 2019-02-15 DIAGNOSIS — N186 End stage renal disease: Secondary | ICD-10-CM | POA: Diagnosis not present

## 2019-02-15 DIAGNOSIS — D509 Iron deficiency anemia, unspecified: Secondary | ICD-10-CM | POA: Diagnosis not present

## 2019-02-15 DIAGNOSIS — Z992 Dependence on renal dialysis: Secondary | ICD-10-CM | POA: Diagnosis not present

## 2019-02-15 DIAGNOSIS — N2581 Secondary hyperparathyroidism of renal origin: Secondary | ICD-10-CM | POA: Diagnosis not present

## 2019-02-17 ENCOUNTER — Encounter: Payer: Self-pay | Admitting: Nurse Practitioner

## 2019-02-17 ENCOUNTER — Ambulatory Visit: Admission: RE | Admit: 2019-02-17 | Payer: Medicare Other | Source: Ambulatory Visit | Admitting: Radiation Oncology

## 2019-02-17 DIAGNOSIS — N2581 Secondary hyperparathyroidism of renal origin: Secondary | ICD-10-CM | POA: Diagnosis not present

## 2019-02-17 DIAGNOSIS — Z992 Dependence on renal dialysis: Secondary | ICD-10-CM | POA: Diagnosis not present

## 2019-02-17 DIAGNOSIS — N186 End stage renal disease: Secondary | ICD-10-CM | POA: Diagnosis not present

## 2019-02-17 DIAGNOSIS — D509 Iron deficiency anemia, unspecified: Secondary | ICD-10-CM | POA: Diagnosis not present

## 2019-02-18 ENCOUNTER — Inpatient Hospital Stay: Payer: Medicare Other

## 2019-02-18 ENCOUNTER — Other Ambulatory Visit: Payer: Self-pay

## 2019-02-18 ENCOUNTER — Encounter: Payer: Self-pay | Admitting: *Deleted

## 2019-02-18 ENCOUNTER — Telehealth: Payer: Self-pay | Admitting: *Deleted

## 2019-02-18 ENCOUNTER — Inpatient Hospital Stay: Payer: Medicare Other | Attending: Internal Medicine | Admitting: Internal Medicine

## 2019-02-18 ENCOUNTER — Telehealth: Payer: Self-pay | Admitting: Internal Medicine

## 2019-02-18 ENCOUNTER — Telehealth: Payer: Medicare Other | Admitting: Nurse Practitioner

## 2019-02-18 DIAGNOSIS — N186 End stage renal disease: Secondary | ICD-10-CM | POA: Insufficient documentation

## 2019-02-18 DIAGNOSIS — C3411 Malignant neoplasm of upper lobe, right bronchus or lung: Secondary | ICD-10-CM | POA: Insufficient documentation

## 2019-02-18 DIAGNOSIS — D649 Anemia, unspecified: Secondary | ICD-10-CM | POA: Insufficient documentation

## 2019-02-18 DIAGNOSIS — Z992 Dependence on renal dialysis: Secondary | ICD-10-CM | POA: Insufficient documentation

## 2019-02-18 NOTE — Telephone Encounter (Signed)
Oncology Nurse Navigator Documentation  Oncology Nurse Navigator Flowsheets 02/18/2019  Abnormal Finding Date -  Confirmed Diagnosis Date -  Diagnosis Status -  Navigator Follow Up Date: -  Navigator Follow Up Reason: -  Navigator Location [facility]  Navigator Encounter Type Telephone/Ms. Smoker was a no show to see Dr. Julien Nordmann today.  Patient has missed several appts.  I spoke with Dr. Julien Nordmann about her missing appts.  I called patient to check on her but was unable to reach or leave vm message.    Telephone Outgoing Call  [facility] Date -  Multidisiplinary Clinic Type -  Patient Visit Type -  Treatment Phase -  Barriers/Navigation Needs Education  Education Other  Interventions Education  Acuity Level 2-Minimal Needs (1-2 Barriers Identified)  Coordination of Care -  Education Method -  Time Spent with Patient 15

## 2019-02-18 NOTE — Telephone Encounter (Signed)
R/s appt on 1/12 to add MD appt - per 1/5 sch message - pt daughter aware of app times

## 2019-02-18 NOTE — Progress Notes (Signed)
This encounter was created in error - please disregard.

## 2019-02-19 ENCOUNTER — Other Ambulatory Visit: Payer: Self-pay

## 2019-02-19 ENCOUNTER — Telehealth: Payer: Self-pay

## 2019-02-19 ENCOUNTER — Encounter: Payer: Self-pay | Admitting: Nurse Practitioner

## 2019-02-19 ENCOUNTER — Encounter: Payer: Medicare Other | Admitting: Nurse Practitioner

## 2019-02-19 DIAGNOSIS — N2581 Secondary hyperparathyroidism of renal origin: Secondary | ICD-10-CM | POA: Diagnosis not present

## 2019-02-19 DIAGNOSIS — N186 End stage renal disease: Secondary | ICD-10-CM | POA: Diagnosis not present

## 2019-02-19 DIAGNOSIS — D509 Iron deficiency anemia, unspecified: Secondary | ICD-10-CM | POA: Diagnosis not present

## 2019-02-19 DIAGNOSIS — Z992 Dependence on renal dialysis: Secondary | ICD-10-CM | POA: Diagnosis not present

## 2019-02-19 NOTE — Telephone Encounter (Signed)
Attempted to call patient twice to complete her virtual visit. YRL,RMA

## 2019-02-21 ENCOUNTER — Other Ambulatory Visit: Payer: Self-pay

## 2019-02-21 ENCOUNTER — Ambulatory Visit (INDEPENDENT_AMBULATORY_CARE_PROVIDER_SITE_OTHER): Payer: Medicare Other

## 2019-02-21 ENCOUNTER — Telehealth: Payer: Medicare Other

## 2019-02-21 DIAGNOSIS — I10 Essential (primary) hypertension: Secondary | ICD-10-CM | POA: Diagnosis not present

## 2019-02-21 DIAGNOSIS — Z992 Dependence on renal dialysis: Secondary | ICD-10-CM | POA: Diagnosis not present

## 2019-02-21 DIAGNOSIS — N186 End stage renal disease: Secondary | ICD-10-CM

## 2019-02-21 DIAGNOSIS — R918 Other nonspecific abnormal finding of lung field: Secondary | ICD-10-CM

## 2019-02-24 DIAGNOSIS — D509 Iron deficiency anemia, unspecified: Secondary | ICD-10-CM | POA: Diagnosis not present

## 2019-02-24 DIAGNOSIS — N2581 Secondary hyperparathyroidism of renal origin: Secondary | ICD-10-CM | POA: Diagnosis not present

## 2019-02-24 DIAGNOSIS — Z992 Dependence on renal dialysis: Secondary | ICD-10-CM | POA: Diagnosis not present

## 2019-02-24 DIAGNOSIS — N186 End stage renal disease: Secondary | ICD-10-CM | POA: Diagnosis not present

## 2019-02-24 NOTE — Patient Instructions (Signed)
Visit Information  Goals Addressed      Patient Stated   . "I need a biopsy to determine what medication will help" (pt-stated)       Current Barriers:  . Limited testing to identify lesion in L lung . Knowledge Deficits related to evaluation and treatment of L lung mass . Chronic Disease Management support and education needs related to End Stage Renal Disease on dialysis, HTN, Lung Mass  Clinical Social Work Clinical Goal(s):  Marland Kitchen Over the next 30 days the patient will attend lung biopsy as scheduled on 12/04/18 to assist with tissue identification Goal Met . Over the next 60 days the patient will become more knowledgeable of diagnosis as evidenced by ability to verbalize treatment plan Goal Met . New - 02/21/19- Over the next 90 days, patient will keep all MD appointments and Chemo/Radiation treatments as scheduled and or recommended.   CCM RN CM Interventions: 02/21/19 call completed with patients daughter [name]  . Evaluation of current treatment plan related to Lung CA treatment and patient's adherence to plan as established by provider . Determined the patient and daughter [name] have a good understanding of the patient's CA txt plan including radiation therapy and Chemo therapy . Determined the patient's daughter has no questions related to these recommendations at this time and will contact the Oncologist and or Cancer Ctr with questions . Determined Ms. Stifter missed a follow up appointment with Dr. Julien Nordmann last week due to not feeling well; Determined patient is scheduled to f/u with Dr. Julien Nordmann next week following her Radiation treatment  . Assessed for transportation barriers, dtr [name] denies, she states she is able to provide transportation at this time . Discussed plans with patient for ongoing care management follow up and provided patient with direct contact information for care management team  Patient Self Care Activities:  . Self administers medications as  prescribed . Attends all scheduled provider appointments . Calls provider office for new concerns or questions  Please see past updates related to this goal by clicking on the "Past Updates" button in the selected goal       Other   . To assist patient with obtaining an Oxygen Concentrator       Current Barriers:  Marland Kitchen Knowledge Deficits related to how to obtain an Oxygen concentrator for continuous Oxygen use . Non adherence to continuous daytime Oxygen therapy . Chronic Disease Management support and education needs related to End Stage Renal Disease on dialysis, Hypertension, Lung Mass  Nurse Case Manager Clinical Goal(s):  Marland Kitchen Over the next 30 days, patient will work with CCM RN and PCP provider to address needs related to obtaining an order and Care Coordination for an Oxygen Concentrator for continuous Oxygen therapy Goal Not Met . Over the next 90 days, patient will report 100% of adherence to wearing her Oxygen exactly as prescribed . 02/10/19 New Over the next 14 days, patient will keep appointment with Oncologist, Dr. Julien Nordmann for evaluation of shortness of breath and continuous Oxygen use  CCM RN CM Interventions:  02/21/19 call completed with daughter [name]   . Evaluation of current treatment plan related to shortness of breath not related to ESRD and the need for continuous Oxygen therapy and patient's adherence to plan as established by provider. . Discussed plans with patient for ongoing care management follow up and provided patient with direct contact information for care management team . Determined patient did not keep her appointment with Dr. Julien Nordmann or with PCP [name],  FNP last week . Determined patient did not "feel like going to her appointments" per patient's daughter [name] . Encouraged daughter to contact the PCP office to reschedule the appointment, discussed the PCP appointment can be completed as a virtual visit  . Discussed and reviewed upcoming scheduled  Radiation treatments as well as a f/u appointment with Dr. Julien Nordmann, scheduled for 02/25/19 . Assessed for transportation barriers and dtr [name] confirms she will be able to provide transportation for these appointments   Patient Self Care Activities:  . Patient verbalizes understanding of plan to ask Oncologist, Dr. Julien Nordmann evaluate and treat patient's ongoing shortness of breath . Self administers medications as prescribed . Attends all scheduled provider appointments . Calls pharmacy for medication refills . Performs ADL's independently . Performs IADL's independently . Calls provider office for new concerns or questions  Please see past updates related to this goal by clicking on the "Past Updates" button in the selected goal        The patient verbalized understanding of instructions provided today and declined a print copy of patient instruction materials.   Telephone follow up appointment with care management team member scheduled for: 03/07/19  Barb Merino, RN, BSN, CCM Care Management Coordinator [facility]  Direct Phone: [phone]

## 2019-02-24 NOTE — Chronic Care Management (AMB) (Signed)
Chronic Care Management   Follow Up Note   02/21/2019 Name: Jessica Clarke MRN: [id number] DOB: 1950-06-14  Referred by: [name], FNP Reason for referral : Chronic Care Management (CCM RNCM Telephone Follow up )   Jessica Clarke is a 69 y.o. year old female who is a primary care patient of [name], [place]. The CCM team was consulted for assistance with chronic disease management and care coordination needs.    Review of patient status, including review of consultants reports, relevant laboratory and other test results, and collaboration with appropriate care team members and the patient's provider was performed as part of comprehensive patient evaluation and provision of chronic care management services.    SDOH (Social Determinants of Health) screening performed today: None. See Care Plan for related entries.   Placed outbound call to daughter [name] for a CCM RN CM update.   Outpatient Encounter Medications as of 02/21/2019  Medication Sig  . acetaminophen (TYLENOL) 325 MG tablet Take 325-650 mg by mouth every 8 (eight) hours as needed for mild pain.   Marland Kitchen allopurinol (ZYLOPRIM) 100 MG tablet Take 100 mg by mouth daily.    . Amino Acids-Protein Hydrolys (FEEDING SUPPLEMENT, PRO-STAT SUGAR FREE 64,) LIQD Take 30 mLs by mouth 2 (two) times daily.  Marland Kitchen atorvastatin (LIPITOR) 10 MG tablet Take 10 mg by mouth at bedtime.   . B Complex-Folic Acid (B COMPLEX FORMULA 1) TABS Take 1 tablet by mouth daily.   . calcitRIOL (ROCALTROL) 0.5 MCG capsule Take 2 capsules (1 mcg total) by mouth every Monday, Wednesday, and Friday with hemodialysis.  Marland Kitchen cinacalcet (SENSIPAR) 30 MG tablet Take 1 tablet (30 mg total) by mouth daily with supper.  . colchicine 0.6 MG tablet Take 0.6 mg by mouth daily as needed (as directed for gout flares or pain).   . Darbepoetin Alfa (ARANESP) 60 MCG/0.3ML SOSY injection Inject 0.3 mLs (60 mcg total) into the vein every Friday with hemodialysis.  Marland Kitchen loperamide (IMODIUM) 2  MG capsule Take 2 mg by mouth daily as needed for diarrhea or loose stools.   . midodrine (PROAMATINE) 10 MG tablet Take 1 tablet (10 mg total) by mouth 2 (two) times daily with a meal.  . multivitamin (RENA-VIT) TABS tablet Take 1 tablet by mouth at bedtime.  . ondansetron (ZOFRAN ODT) 4 MG disintegrating tablet 29m ODT q4 hours prn nausea/vomit  . prochlorperazine (COMPAZINE) 10 MG tablet Take 1 tablet (10 mg total) by mouth every 6 (six) hours as needed for nausea or vomiting.  . thiamine (VITAMIN B-1) 100 MG tablet Take 100 mg by mouth daily.  . VELPHORO 500 MG chewable tablet Chew 1,000 mg by mouth 3 (three) times daily with meals.   . Vitamin D, Cholecalciferol, 25 MCG (1000 UT) TABS Take 1,000 Units by mouth daily.    No facility-administered encounter medications on file as of 02/21/2019.     Goals Addressed      Patient Stated   . "I need a biopsy to determine what medication will help" (pt-stated)       Current Barriers:  . Limited testing to identify lesion in L lung . Knowledge Deficits related to evaluation and treatment of L lung mass . Chronic Disease Management support and education needs related to End Stage Renal Disease on dialysis, HTN, Lung Mass  Clinical Social Work Clinical Goal(s):  .Marland KitchenOver the next 30 days the patient will attend lung biopsy as scheduled on 12/04/18 to assist with tissue identification Goal Met . Over  the next 60 days the patient will become more knowledgeable of diagnosis as evidenced by ability to verbalize treatment plan Goal Met . New - 02/21/19- Over the next 90 days, patient will keep all MD appointments and Chemo/Radiation treatments as scheduled and or recommended.   CCM RN CM Interventions: 02/21/19 call completed with patients daughter [name]  . Evaluation of current treatment plan related to Lung CA treatment and patient's adherence to plan as established by provider . Determined the patient and daughter [name] have a good understanding  of the patient's CA txt plan including radiation therapy and Chemo therapy . Determined the patient's daughter has no questions related to these recommendations at this time and will contact the Oncologist and or Cancer Ctr with questions . Determined Ms. Kachmar missed a follow up appointment with Dr. Julien Nordmann last week due to not feeling well; Determined patient is scheduled to f/u with Dr. Julien Nordmann next week following her Radiation treatment  . Assessed for transportation barriers, dtr [name] denies, she states she is able to provide transportation at this time . Discussed plans with patient for ongoing care management follow up and provided patient with direct contact information for care management team  Patient Self Care Activities:  . Self administers medications as prescribed . Attends all scheduled provider appointments . Calls provider office for new concerns or questions  Please see past updates related to this goal by clicking on the "Past Updates" button in the selected goal       Other   . To assist patient with obtaining an Oxygen Concentrator       Current Barriers:  Marland Kitchen Knowledge Deficits related to how to obtain an Oxygen concentrator for continuous Oxygen use . Non adherence to continuous daytime Oxygen therapy . Chronic Disease Management support and education needs related to End Stage Renal Disease on dialysis, Hypertension, Lung Mass  Nurse Case Manager Clinical Goal(s):  Marland Kitchen Over the next 30 days, patient will work with CCM RN and PCP provider to address needs related to obtaining an order and Care Coordination for an Oxygen Concentrator for continuous Oxygen therapy Goal Not Met . Over the next 90 days, patient will report 100% of adherence to wearing her Oxygen exactly as prescribed . 02/10/19 New Over the next 14 days, patient will keep appointment with Oncologist, Dr. Julien Nordmann for evaluation of shortness of breath and continuous Oxygen use  CCM RN CM Interventions:    02/21/19 call completed with daughter [name]   . Evaluation of current treatment plan related to shortness of breath not related to ESRD and the need for continuous Oxygen therapy and patient's adherence to plan as established by provider. . Discussed plans with patient for ongoing care management follow up and provided patient with direct contact information for care management team . Determined patient did not keep her appointment with Dr. Julien Nordmann or with PCP [name], FNP last week . Determined patient did not "feel like going to her appointments" per patient's daughter [name] . Encouraged daughter to contact the PCP office to reschedule the appointment, discussed the PCP appointment can be completed as a virtual visit  . Discussed and reviewed upcoming scheduled Radiation treatments as well as a f/u appointment with Dr. Julien Nordmann, scheduled for 02/25/19 . Assessed for transportation barriers and dtr [name] confirms she will be able to provide transportation for these appointments    Patient Self Care Activities:  . Patient verbalizes understanding of plan to ask Oncologist, Dr. Julien Nordmann evaluate and treat patient's ongoing  shortness of breath . Self administers medications as prescribed . Attends all scheduled provider appointments . Calls pharmacy for medication refills . Performs ADL's independently . Performs IADL's independently . Calls provider office for new concerns or questions  Please see past updates related to this goal by clicking on the "Past Updates" button in the selected goal         Telephone follow up appointment with care management team member scheduled for: 03/07/19   Barb Merino, RN, BSN, CCM Care Management Coordinator [facility]  Direct Phone: [phone]

## 2019-02-25 ENCOUNTER — Other Ambulatory Visit: Payer: Self-pay

## 2019-02-25 ENCOUNTER — Telehealth: Payer: Self-pay | Admitting: *Deleted

## 2019-02-25 ENCOUNTER — Inpatient Hospital Stay: Payer: Medicare Other

## 2019-02-25 ENCOUNTER — Encounter: Payer: Self-pay | Admitting: Internal Medicine

## 2019-02-25 ENCOUNTER — Encounter: Payer: Self-pay | Admitting: *Deleted

## 2019-02-25 ENCOUNTER — Other Ambulatory Visit: Payer: Medicare Other

## 2019-02-25 ENCOUNTER — Ambulatory Visit: Payer: Medicare Other

## 2019-02-25 ENCOUNTER — Inpatient Hospital Stay (HOSPITAL_BASED_OUTPATIENT_CLINIC_OR_DEPARTMENT_OTHER): Payer: Medicare Other | Admitting: Internal Medicine

## 2019-02-25 VITALS — BP 130/86 | HR 64 | Temp 98.2°F | Resp 17 | Ht 71.0 in | Wt 152.5 lb

## 2019-02-25 DIAGNOSIS — C3491 Malignant neoplasm of unspecified part of right bronchus or lung: Secondary | ICD-10-CM

## 2019-02-25 DIAGNOSIS — Z992 Dependence on renal dialysis: Secondary | ICD-10-CM | POA: Diagnosis not present

## 2019-02-25 DIAGNOSIS — D649 Anemia, unspecified: Secondary | ICD-10-CM | POA: Diagnosis not present

## 2019-02-25 DIAGNOSIS — N186 End stage renal disease: Secondary | ICD-10-CM | POA: Diagnosis not present

## 2019-02-25 DIAGNOSIS — C3411 Malignant neoplasm of upper lobe, right bronchus or lung: Secondary | ICD-10-CM | POA: Diagnosis not present

## 2019-02-25 DIAGNOSIS — Z510 Encounter for antineoplastic radiation therapy: Secondary | ICD-10-CM | POA: Insufficient documentation

## 2019-02-25 LAB — CBC WITH DIFFERENTIAL (CANCER CENTER ONLY)
Abs Immature Granulocytes: 0.01 K/uL (ref 0.00–0.07)
Basophils Absolute: 0 K/uL (ref 0.0–0.1)
Basophils Relative: 1 %
Eosinophils Absolute: 0 K/uL (ref 0.0–0.5)
Eosinophils Relative: 0 %
HCT: 30.6 % — ABNORMAL LOW (ref 36.0–46.0)
Hemoglobin: 9.9 g/dL — ABNORMAL LOW (ref 12.0–15.0)
Immature Granulocytes: 0 %
Lymphocytes Relative: 13 %
Lymphs Abs: 0.7 K/uL (ref 0.7–4.0)
MCH: 30.7 pg (ref 26.0–34.0)
MCHC: 32.4 g/dL (ref 30.0–36.0)
MCV: 94.7 fL (ref 80.0–100.0)
Monocytes Absolute: 0.5 K/uL (ref 0.1–1.0)
Monocytes Relative: 11 %
Neutro Abs: 3.9 K/uL (ref 1.7–7.7)
Neutrophils Relative %: 75 %
Platelet Count: 135 K/uL — ABNORMAL LOW (ref 150–400)
RBC: 3.23 MIL/uL — ABNORMAL LOW (ref 3.87–5.11)
RDW: 18.4 % — ABNORMAL HIGH (ref 11.5–15.5)
WBC Count: 5.1 K/uL (ref 4.0–10.5)
nRBC: 0 % (ref 0.0–0.2)

## 2019-02-25 LAB — CMP (CANCER CENTER ONLY)
ALT: 8 U/L (ref 0–44)
AST: 15 U/L (ref 15–41)
Albumin: 2.8 g/dL — ABNORMAL LOW (ref 3.5–5.0)
Alkaline Phosphatase: 167 U/L — ABNORMAL HIGH (ref 38–126)
Anion gap: 18 — ABNORMAL HIGH (ref 5–15)
BUN: 38 mg/dL — ABNORMAL HIGH (ref 8–23)
CO2: 24 mmol/L (ref 22–32)
Calcium: 8.8 mg/dL — ABNORMAL LOW (ref 8.9–10.3)
Chloride: 95 mmol/L — ABNORMAL LOW (ref 98–111)
Creatinine: 5.46 mg/dL (ref 0.44–1.00)
GFR, Est AFR Am: 8 mL/min — ABNORMAL LOW (ref >60)
GFR, Estimated: 7 mL/min — ABNORMAL LOW (ref >60)
Glucose, Bld: 80 mg/dL (ref 70–99)
Potassium: 3.6 mmol/L (ref 3.5–5.1)
Sodium: 137 mmol/L (ref 135–145)
Total Bilirubin: 0.7 mg/dL (ref 0.3–1.2)
Total Protein: 8 g/dL (ref 6.5–8.1)

## 2019-02-25 NOTE — Progress Notes (Signed)
Oncology Nurse Navigator Documentation  Oncology Nurse Navigator Flowsheets 02/25/2019  Abnormal Finding Date -  Confirmed Diagnosis Date -  Diagnosis Status -  Navigator Follow Up Date: -  Navigator Follow Up Reason: -  Navigator Location [facility]  Navigator Encounter Type Clinic/MDC/I spoke with Jessica Clarke today.  She was very difficult to talk with. She was angry and closed her eyes during conversation. I explained treatment plan and next steps.  I reached out to rad onc to get her re-scheduled with them.  She asked that they call her daughter and I updated them on this request.   Telephone -  [facility] Date -  Multidisiplinary Clinic Type -  Patient Visit Type MedOnc  Treatment Phase Pre-Tx/Tx Discussion  Barriers/Navigation Needs Coordination of Care;Education  Education Other  Interventions Coordination of Care;Education  Acuity Level 2-Minimal Needs (1-2 Barriers Identified)  Coordination of Care Other  Education Method Verbal  Time Spent with Patient 30

## 2019-02-25 NOTE — Telephone Encounter (Signed)
Received call from [facility] from our lab stating pt has a panic creat of 4/46.  Pt seeing MD today.  Reported to Dr Olene Floss.

## 2019-02-25 NOTE — Progress Notes (Signed)
[place] [phone]   [phone]  OFFICE PROGRESS NOTE  Jessica Clarke, [address] [place] [address]  DIAGNOSIS: Stage IIIb/IV (T2a, N3, M0/M1a) non-small cell lung cancer, adenocarcinoma presented with right upper lobe lung mass in addition to mediastinal and right supraclavicular lymphadenopathy and suspicious right pleural effusion diagnosed in November 2020.   PRIOR THERAPY: None  CURRENT THERAPY: Concurrent chemoradiation with weekly carboplatin for AUC of 2 and paclitaxel 45 NG/M2.  The patient has not started the treatment yet.  INTERVAL HISTORY: Jessica Clarke 69 y.o. female returns to the clinic today for follow-up visit.  The patient continues to complain of increasing fatigue and weakness as well as abdominal pain the next day after dialysis.  She missed several appointment for the simulation of her radiation treatment.  She was supposed to start a course of concurrent chemoradiation last week.  She denied having any current fever or chills.  She has no chest pain but has shortness of breath at baseline increased with exertion with no cough or hemoptysis.  She denied having any headache or visual changes.  She is here today for routine follow-up visit.  MEDICAL HISTORY: Past Medical History:  Diagnosis Date  . Abnormal Pap smear    ASCUS ?HGSIL  . Anemia   . Arthritis   . Cancer ([place])    lung ( per daughter)  . Cervical dysplasia   . CHF (congestive heart failure) ([place])   . Chronic kidney disease   . Constipation   . Constipation   . Dyspnea   . GERD (gastroesophageal reflux disease)   . Gout   . Hyperlipidemia   . Hypertension   . Myocardial infarction ([place]) 2015  . Obesity   . Pneumonia   . Uterine polyp     ALLERGIES:  has No Known Allergies.  MEDICATIONS:  Current Outpatient Medications  Medication Sig Dispense Refill  . acetaminophen (TYLENOL) 325 MG tablet Take 325-650 mg by mouth every  8 (eight) hours as needed for mild pain.     Marland Kitchen allopurinol (ZYLOPRIM) 100 MG tablet Take 100 mg by mouth daily.      . Amino Acids-Protein Hydrolys (FEEDING SUPPLEMENT, PRO-STAT SUGAR FREE 64,) LIQD Take 30 mLs by mouth 2 (two) times daily. 887 mL 0  . atorvastatin (LIPITOR) 10 MG tablet Take 10 mg by mouth at bedtime.   0  . B Complex-Folic Acid (B COMPLEX FORMULA 1) TABS Take 1 tablet by mouth daily.     . calcitRIOL (ROCALTROL) 0.5 MCG capsule Take 2 capsules (1 mcg total) by mouth every Monday, Wednesday, and Friday with hemodialysis. 30 capsule 0  . cinacalcet (SENSIPAR) 30 MG tablet Take 1 tablet (30 mg total) by mouth daily with supper. 60 tablet 0  . colchicine 0.6 MG tablet Take 0.6 mg by mouth daily as needed (as directed for gout flares or pain).     . Darbepoetin Alfa (ARANESP) 60 MCG/0.3ML SOSY injection Inject 0.3 mLs (60 mcg total) into the vein every Friday with hemodialysis. 4.2 mL   . loperamide (IMODIUM) 2 MG capsule Take 2 mg by mouth daily as needed for diarrhea or loose stools.     . midodrine (PROAMATINE) 10 MG tablet Take 1 tablet (10 mg total) by mouth 2 (two) times daily with a meal. 60 tablet 0  . multivitamin (RENA-VIT) TABS tablet Take 1 tablet by mouth at bedtime. 30 tablet 0  . ondansetron (ZOFRAN ODT) 4  MG disintegrating tablet 4mg  ODT q4 hours prn nausea/vomit 10 tablet 0  . prochlorperazine (COMPAZINE) 10 MG tablet Take 1 tablet (10 mg total) by mouth every 6 (six) hours as needed for nausea or vomiting. 30 tablet 1  . thiamine (VITAMIN B-1) 100 MG tablet Take 100 mg by mouth daily.    . VELPHORO 500 MG chewable tablet Chew 1,000 mg by mouth 3 (three) times daily with meals.     . Vitamin D, Cholecalciferol, 25 MCG (1000 UT) TABS Take 1,000 Units by mouth daily.      No current facility-administered medications for this visit.    SURGICAL HISTORY:  Past Surgical History:  Procedure Laterality Date  . AV FISTULA PLACEMENT  01/18/2012   Procedure: ARTERIOVENOUS  (AV) FISTULA CREATION;  Surgeon: Angelia Mould, MD;  Location: [facility];  Service: Vascular;  Laterality: Left;  . BIOPSY  10/20/2018   Procedure: BIOPSY;  Surgeon: Thornton Park, MD;  Location: [facility];  Service: Gastroenterology;;  . COLONOSCOPY WITH PROPOFOL N/A 10/20/2018   Procedure: COLONOSCOPY WITH PROPOFOL;  Surgeon: Thornton Park, MD;  Location: [facility];  Service: Gastroenterology;  Laterality: N/A;  . DILATION AND CURETTAGE OF UTERUS    . HYSTEROSCOPY    . LEEP    . LEFT HEART CATHETERIZATION WITH CORONARY ANGIOGRAM N/A 08/26/2013   Procedure: LEFT HEART CATHETERIZATION WITH CORONARY ANGIOGRAM;  Surgeon: Leonie Man, MD;  Location: [facility] CATH LAB;  Service: Cardiovascular;  Laterality: N/A;  . POLYPECTOMY  10/20/2018   Procedure: POLYPECTOMY;  Surgeon: Thornton Park, MD;  Location: [facility] ENDOSCOPY;  Service: Gastroenterology;;  . REVISON OF ARTERIOVENOUS FISTULA Left 7/37/1062   Procedure: PLICATION OF ARTERIOVENOUS FISTULA;  Surgeon: Conrad Richfield, MD;  Location: [facility];  Service: Vascular;  Laterality: Left;  Marland Kitchen VIDEO BRONCHOSCOPY WITH ENDOBRONCHIAL ULTRASOUND N/A 12/19/2018   Procedure: VIDEO BRONCHOSCOPY WITH ENDOBRONCHIAL ULTRASOUND;  Surgeon: Garner Nash, DO;  Location: [facility] OR;  Service: Thoracic;  Laterality: N/A;    REVIEW OF SYSTEMS:  A comprehensive review of systems was negative except for: Constitutional: positive for fatigue Respiratory: positive for cough and dyspnea on exertion Musculoskeletal: positive for muscle weakness   PHYSICAL EXAMINATION: General appearance: alert, cooperative, fatigued and no distress Head: Normocephalic, without obvious abnormality, atraumatic Neck: no adenopathy, no JVD, supple, symmetrical, trachea midline and thyroid not enlarged, symmetric, no tenderness/mass/nodules Lymph nodes: Cervical, supraclavicular, and axillary nodes normal. Resp: clear to auscultation bilaterally Back: symmetric, no curvature. ROM normal. No  CVA tenderness. Cardio: regular rate and rhythm, S1, S2 normal, no murmur, click, rub or gallop GI: soft, non-tender; bowel sounds normal; no masses,  no organomegaly Extremities: extremities normal, atraumatic, no cyanosis or edema  ECOG PERFORMANCE STATUS: 1 - Symptomatic but completely ambulatory  Blood pressure 130/86, pulse 64, temperature 98.2 F (36.8 C), temperature source Temporal, resp. rate 17, height 5\' 11"  (1.803 m), weight 152 lb 8 oz (69.2 kg), SpO2 97 %.  LABORATORY DATA: Lab Results  Component Value Date   WBC 5.1 02/25/2019   HGB 9.9 (L) 02/25/2019   HCT 30.6 (L) 02/25/2019   MCV 94.7 02/25/2019   PLT 135 (L) 02/25/2019      Chemistry      Component Value Date/Time   NA 129 (L) 02/08/2019 0858   NA 136 11/19/2018 1640   K 3.7 02/08/2019 0858   CL 93 (L) 02/08/2019 0858   CO2 24 02/08/2019 0858   BUN 28 (H) 02/08/2019 0858   BUN 19 11/19/2018 1640  CREATININE 4.90 (H) 02/08/2019 0858   CREATININE 4.81 (HH) 02/04/2019 1029      Component Value Date/Time   CALCIUM 7.4 (L) 02/08/2019 0858   CALCIUM 9.0 10/22/2009 2253   ALKPHOS 152 (H) 02/05/2019 1325   AST 15 02/05/2019 1325   AST 15 02/04/2019 1029   ALT 12 02/05/2019 1325   ALT 8 02/04/2019 1029   BILITOT 0.9 02/05/2019 1325   BILITOT 0.8 02/04/2019 1029       RADIOGRAPHIC STUDIES: DG Chest 2 View  Result Date: 02/05/2019 CLINICAL DATA:  69 year old female with nausea vomiting and pleuritic chest pain. EXAM: CHEST - 2 VIEW COMPARISON:  Portable chest 10/22/2018 and earlier. CT chest 11/12/2018. FINDINGS: Spiculated right upper lobe lung mass demonstrated by CT has been poorly visible radiographically but might have regressed. Right pleural effusion probably persists, small. Stable cardiomegaly and mediastinal contours. No pneumothorax. No definite left pleural effusion. Pulmonary vascularity is unchanged with symmetric vascular congestion suspected. No new confluent opacity. Negative visible bowel  gas pattern. No acute osseous abnormality identified. IMPRESSION: 1. Spiculated right upper lobe lung mass has been better demonstrated by CT. 2. Cardiomegaly with continued pulmonary vascular congestion suspicious for mild interstitial edema. Small right pleural effusion suspected. 3. No pneumothorax or other acute cardiopulmonary abnormality. Electronically Signed   By: Genevie Ann M.D.   On: 02/05/2019 13:24   CT Renal Stone Study  Result Date: 02/05/2019 CLINICAL DATA:  Flank pain, generalized weakness EXAM: CT ABDOMEN AND PELVIS WITHOUT CONTRAST TECHNIQUE: Multidetector CT imaging of the abdomen and pelvis was performed following the standard protocol without IV contrast. COMPARISON:  None. FINDINGS: Lower chest: Small right pleural effusion. No left pleural effusion. Cardiomegaly. Hepatobiliary: No focal hepatic mass. Calcification along the inferior margin of the gallbladder which may reflect a gallstone or volume averaging from the adjacent small bowel. No intrahepatic biliary ductal dilatation. Pancreas: Unremarkable. No pancreatic ductal dilatation or surrounding inflammatory changes. Spleen: Normal in size without focal abnormality. Adrenals/Urinary Tract: Indeterminate 9 mm left adrenal nodule. Severe atrophy of bilateral kidneys. No urolithiasis or obstructive uropathy. Decompressed bladder. Stomach/Bowel: No bowel dilatation to suggest obstruction. Mild small bowel wall thickening in the left side of the abdomen as can be seen with enteritis secondary to an infectious or inflammatory etiology. No pneumatosis, pneumoperitoneum or portal venous gas. Diverticulosis without evidence of diverticulitis. Vascular/Lymphatic: Normal caliber abdominal aorta with mild atherosclerosis. No lymphadenopathy. Reproductive: Status post hysterectomy. No adnexal masses. Other: No fluid collection or hematoma.  Anasarca is present. Musculoskeletal: No acute osseous abnormality. No aggressive osseous lesion. Degenerative  disease with disc height loss throughout the lumbar spine. Schmorl's node at L3 and L4. IMPRESSION: 1. Mild small bowel wall thickening in the left side of the abdomen as can be seen with enteritis secondary to an infectious or inflammatory etiology. 2. Generalized anasarca. 3. Small right pleural effusion. 4. Diverticulosis without evidence of diverticulitis. 5. No bowel obstruction. Electronically Signed   By: Kathreen Devoid   On: 02/05/2019 14:51    ASSESSMENT AND PLAN: This is a very pleasant 69 years old African-American female with at least stage IIIc/IV (T2a, N3, M0/M1a) presented with right upper lobe lung mass in addition to mediastinal and right supraclavicular lymphadenopathy and suspicious right pleural effusion diagnosed in November 2020. I had a lengthy discussion with the patient and her daughter today about her current condition and treatment options. The patient was supposed to start a course of concurrent chemoradiation with weekly carboplatin and paclitaxel but she missed several  appointments for her radiotherapy simulation. I will delay the start of her chemotherapy until she is ready to start radiation therapy.  We will contact radiation oncology to reschedule her simulation. I will see her back for follow-up visit in 2 weeks if she proceed with the treatment as planned. For the end-stage renal disease, she is currently on hemodialysis Monday, Wednesday and Friday by Dr. Justin Mend. The patient was advised to call immediately if she has any concerning symptoms in the interval. The patient voices understanding of current disease status and treatment options and is in agreement with the current care plan.  All questions were answered. The patient knows to call the clinic with any problems, questions or concerns. We can certainly see the patient much sooner if necessary.   Disclaimer: This note was dictated with voice recognition software. Similar sounding words can inadvertently be  transcribed and may not be corrected upon review.

## 2019-02-26 ENCOUNTER — Telehealth: Payer: Self-pay | Admitting: *Deleted

## 2019-02-26 DIAGNOSIS — D509 Iron deficiency anemia, unspecified: Secondary | ICD-10-CM | POA: Diagnosis not present

## 2019-02-26 DIAGNOSIS — Z992 Dependence on renal dialysis: Secondary | ICD-10-CM | POA: Diagnosis not present

## 2019-02-26 DIAGNOSIS — N2581 Secondary hyperparathyroidism of renal origin: Secondary | ICD-10-CM | POA: Diagnosis not present

## 2019-02-26 DIAGNOSIS — N186 End stage renal disease: Secondary | ICD-10-CM | POA: Diagnosis not present

## 2019-02-26 NOTE — Telephone Encounter (Signed)
Oncology Nurse Navigator Documentation  Oncology Nurse Navigator Flowsheets 02/26/2019  Abnormal Finding Date 02/26/2018  Confirmed Diagnosis Date 12/19/2018  Diagnosis Status Confirmed Diagnosis Complete  Planned Course of Treatment Chemo/Radiation Concurrent  Phase of Treatment Chemo/Radiation Concurrent  Chemo/Radiation Concurrent Pending- Reason: Patient Request/Initiated  Chemo/Radiation Concurrent Actual Start Date: 03/04/2019  Navigator Follow Up Date: -  Navigator Follow Up Reason: -  Navigator Location [facility]  Navigator Encounter Type Letter/Fax/Email;Telephone  Telephone Outgoing Call/I called patient's daughter to follow up with her on patient's schedule for treatment.  She verbalized understanding of upcoming appts.  I asked if she would like a copy of schedule mailed to her, she states yes.  I will place in outgoing mail today.    Multidisiplinary Clinic Date -  Multidisiplinary Clinic Type -  Treatment Initiated Date 03/04/2019  Patient Visit Type -  Treatment Phase Pre-Tx/Tx Discussion  Barriers/Navigation Needs Education  Education Other  Interventions Education  Acuity Level 2-Minimal Needs (1-2 Barriers Identified)  Coordination of Care -  Education Method Verbal;Written  Time Spent with Patient 30

## 2019-02-27 DIAGNOSIS — H90A32 Mixed conductive and sensorineural hearing loss, unilateral, left ear with restricted hearing on the contralateral side: Secondary | ICD-10-CM | POA: Diagnosis not present

## 2019-02-27 DIAGNOSIS — H90A21 Sensorineural hearing loss, unilateral, right ear, with restricted hearing on the contralateral side: Secondary | ICD-10-CM | POA: Diagnosis not present

## 2019-02-28 DIAGNOSIS — N186 End stage renal disease: Secondary | ICD-10-CM | POA: Diagnosis not present

## 2019-02-28 DIAGNOSIS — N2581 Secondary hyperparathyroidism of renal origin: Secondary | ICD-10-CM | POA: Diagnosis not present

## 2019-02-28 DIAGNOSIS — Z992 Dependence on renal dialysis: Secondary | ICD-10-CM | POA: Diagnosis not present

## 2019-02-28 DIAGNOSIS — D509 Iron deficiency anemia, unspecified: Secondary | ICD-10-CM | POA: Diagnosis not present

## 2019-03-04 ENCOUNTER — Other Ambulatory Visit: Payer: Self-pay

## 2019-03-04 ENCOUNTER — Inpatient Hospital Stay: Payer: Medicare Other

## 2019-03-04 ENCOUNTER — Encounter: Payer: Self-pay | Admitting: Internal Medicine

## 2019-03-04 ENCOUNTER — Inpatient Hospital Stay (HOSPITAL_BASED_OUTPATIENT_CLINIC_OR_DEPARTMENT_OTHER): Payer: Medicare Other | Admitting: Internal Medicine

## 2019-03-04 VITALS — BP 118/57 | Temp 98.0°F | Resp 17 | Ht 71.0 in | Wt 151.1 lb

## 2019-03-04 DIAGNOSIS — D649 Anemia, unspecified: Secondary | ICD-10-CM | POA: Diagnosis not present

## 2019-03-04 DIAGNOSIS — I10 Essential (primary) hypertension: Secondary | ICD-10-CM | POA: Diagnosis not present

## 2019-03-04 DIAGNOSIS — N186 End stage renal disease: Secondary | ICD-10-CM | POA: Diagnosis not present

## 2019-03-04 DIAGNOSIS — C3491 Malignant neoplasm of unspecified part of right bronchus or lung: Secondary | ICD-10-CM | POA: Diagnosis not present

## 2019-03-04 DIAGNOSIS — C3490 Malignant neoplasm of unspecified part of unspecified bronchus or lung: Secondary | ICD-10-CM

## 2019-03-04 DIAGNOSIS — Z992 Dependence on renal dialysis: Secondary | ICD-10-CM | POA: Diagnosis not present

## 2019-03-04 DIAGNOSIS — C3411 Malignant neoplasm of upper lobe, right bronchus or lung: Secondary | ICD-10-CM | POA: Diagnosis not present

## 2019-03-04 LAB — CMP (CANCER CENTER ONLY)
ALT: 7 U/L (ref 0–44)
AST: 12 U/L — ABNORMAL LOW (ref 15–41)
Albumin: 2.8 g/dL — ABNORMAL LOW (ref 3.5–5.0)
Alkaline Phosphatase: 153 U/L — ABNORMAL HIGH (ref 38–126)
Anion gap: 16 — ABNORMAL HIGH (ref 5–15)
BUN: 46 mg/dL — ABNORMAL HIGH (ref 8–23)
CO2: 26 mmol/L (ref 22–32)
Calcium: 8.7 mg/dL — ABNORMAL LOW (ref 8.9–10.3)
Chloride: 95 mmol/L — ABNORMAL LOW (ref 98–111)
Creatinine: 7.04 mg/dL (ref 0.44–1.00)
GFR, Est AFR Am: 6 mL/min — ABNORMAL LOW (ref >60)
GFR, Estimated: 5 mL/min — ABNORMAL LOW (ref >60)
Glucose, Bld: 74 mg/dL (ref 70–99)
Potassium: 3.6 mmol/L (ref 3.5–5.1)
Sodium: 137 mmol/L (ref 135–145)
Total Bilirubin: 0.9 mg/dL (ref 0.3–1.2)
Total Protein: 7.9 g/dL (ref 6.5–8.1)

## 2019-03-04 LAB — CBC WITH DIFFERENTIAL (CANCER CENTER ONLY)
Abs Immature Granulocytes: 0.01 10*3/uL (ref 0.00–0.07)
Basophils Absolute: 0 10*3/uL (ref 0.0–0.1)
Basophils Relative: 1 %
Eosinophils Absolute: 0.1 10*3/uL (ref 0.0–0.5)
Eosinophils Relative: 1 %
HCT: 31.7 % — ABNORMAL LOW (ref 36.0–46.0)
Hemoglobin: 10.2 g/dL — ABNORMAL LOW (ref 12.0–15.0)
Immature Granulocytes: 0 %
Lymphocytes Relative: 24 %
Lymphs Abs: 0.9 10*3/uL (ref 0.7–4.0)
MCH: 31.1 pg (ref 26.0–34.0)
MCHC: 32.2 g/dL (ref 30.0–36.0)
MCV: 96.6 fL (ref 80.0–100.0)
Monocytes Absolute: 0.4 10*3/uL (ref 0.1–1.0)
Monocytes Relative: 12 %
Neutro Abs: 2.3 10*3/uL (ref 1.7–7.7)
Neutrophils Relative %: 62 %
Platelet Count: 160 10*3/uL (ref 150–400)
RBC: 3.28 MIL/uL — ABNORMAL LOW (ref 3.87–5.11)
RDW: 18.1 % — ABNORMAL HIGH (ref 11.5–15.5)
WBC Count: 3.8 10*3/uL — ABNORMAL LOW (ref 4.0–10.5)
nRBC: 0 % (ref 0.0–0.2)

## 2019-03-04 NOTE — Progress Notes (Signed)
Jessica Clarke called from lab with a critical creatinine of 7.04.  Called Abelina Bachelor to report critical value and she had already noted it and cancelled the patient's chemo.  Chemo was cancelled due to patient not starting radiation and not related to the creatinine.  Gardiner Rhyme, RN

## 2019-03-04 NOTE — Progress Notes (Signed)
[place] Telephone:([phone]) [phone]   Fax:([phone]  OFFICE PROGRESS NOTE  [name], [address] [id number]  DIAGNOSIS: Stage IIIb/IV (T2a, N3, M0/M1a) non-small cell lung cancer, adenocarcinoma presented with right upper lobe lung mass in addition to mediastinal and right supraclavicular lymphadenopathy and suspicious right pleural effusion diagnosed in November 2020.   PRIOR THERAPY: None  CURRENT THERAPY: Concurrent chemoradiation with weekly carboplatin for AUC of 2 and paclitaxel 45 NG/M2.  The patient has not started the treatment yet.  INTERVAL HISTORY: Jessica Clarke 69 y.o. female returns to the clinic today for follow-up visit.  Her daughter [name] was available by phone during the visit.  The patient continues to have generalized fatigue and weakness.  She also has shortness of breath at baseline increased with exertion.  Her oxygen saturation on room air was around 90%.  She denied having any current chest pain but has cough with no hemoptysis.  She was rescheduled for CT simulation for radiotherapy on March 06, 2019.  The patient is not familiar with her appointment.  She denied having any current nausea, vomiting but has few episodes of diarrhea yesterday and she missed her dialysis.  She was here today for reevaluation and repeat blood work.  MEDICAL HISTORY: Past Medical History:  Diagnosis Date  . Abnormal Pap smear    ASCUS ?HGSIL  . Anemia   . Arthritis   . Cancer ([place])    lung ( per daughter)  . Cervical dysplasia   . CHF (congestive heart failure) ([place])   . Chronic kidney disease   . Constipation   . Constipation   . Dyspnea   . GERD (gastroesophageal reflux disease)   . Gout   . Hyperlipidemia   . Hypertension   . Myocardial infarction ([place]) 2015  . Obesity   . Pneumonia   . Uterine polyp     ALLERGIES:  has No Known Allergies.  MEDICATIONS:  Current Outpatient Medications  Medication Sig  Dispense Refill  . acetaminophen (TYLENOL) 325 MG tablet Take 325-650 mg by mouth every 8 (eight) hours as needed for mild pain.     Marland Kitchen allopurinol (ZYLOPRIM) 100 MG tablet Take 100 mg by mouth daily.      . Amino Acids-Protein Hydrolys (FEEDING SUPPLEMENT, PRO-STAT SUGAR FREE 64,) LIQD Take 30 mLs by mouth 2 (two) times daily. 887 mL 0  . atorvastatin (LIPITOR) 10 MG tablet Take 10 mg by mouth at bedtime.   0  . B Complex-Folic Acid (B COMPLEX FORMULA 1) TABS Take 1 tablet by mouth daily.     . calcitRIOL (ROCALTROL) 0.5 MCG capsule Take 2 capsules (1 mcg total) by mouth every Monday, Wednesday, and Friday with hemodialysis. 30 capsule 0  . cinacalcet (SENSIPAR) 30 MG tablet Take 1 tablet (30 mg total) by mouth daily with supper. 60 tablet 0  . colchicine 0.6 MG tablet Take 0.6 mg by mouth daily as needed (as directed for gout flares or pain).     . Darbepoetin Alfa (ARANESP) 60 MCG/0.3ML SOSY injection Inject 0.3 mLs (60 mcg total) into the vein every Friday with hemodialysis. 4.2 mL   . loperamide (IMODIUM) 2 MG capsule Take 2 mg by mouth daily as needed for diarrhea or loose stools.     . midodrine (PROAMATINE) 10 MG tablet Take 1 tablet (10 mg total) by mouth 2 (two) times daily with a meal. 60 tablet 0  . multivitamin (RENA-VIT) TABS tablet  Take 1 tablet by mouth at bedtime. 30 tablet 0  . ondansetron (ZOFRAN ODT) 4 MG disintegrating tablet 4mg  ODT q4 hours prn nausea/vomit 10 tablet 0  . prochlorperazine (COMPAZINE) 10 MG tablet Take 1 tablet (10 mg total) by mouth every 6 (six) hours as needed for nausea or vomiting. 30 tablet 1  . thiamine (VITAMIN B-1) 100 MG tablet Take 100 mg by mouth daily.    . VELPHORO 500 MG chewable tablet Chew 1,000 mg by mouth 3 (three) times daily with meals.     . Vitamin D, Cholecalciferol, 25 MCG (1000 UT) TABS Take 1,000 Units by mouth daily.      No current facility-administered medications for this visit.    SURGICAL HISTORY:  Past Surgical History:   Procedure Laterality Date  . AV FISTULA PLACEMENT  01/18/2012   Procedure: ARTERIOVENOUS (AV) FISTULA CREATION;  Surgeon: Angelia Mould, MD;  Location: [facility];  Service: Vascular;  Laterality: Left;  . BIOPSY  10/20/2018   Procedure: BIOPSY;  Surgeon: Thornton Park, MD;  Location: [facility];  Service: Gastroenterology;;  . COLONOSCOPY WITH PROPOFOL N/A 10/20/2018   Procedure: COLONOSCOPY WITH PROPOFOL;  Surgeon: Thornton Park, MD;  Location: [facility];  Service: Gastroenterology;  Laterality: N/A;  . DILATION AND CURETTAGE OF UTERUS    . HYSTEROSCOPY    . LEEP    . LEFT HEART CATHETERIZATION WITH CORONARY ANGIOGRAM N/A 08/26/2013   Procedure: LEFT HEART CATHETERIZATION WITH CORONARY ANGIOGRAM;  Surgeon: Leonie Man, MD;  Location: [facility] CATH LAB;  Service: Cardiovascular;  Laterality: N/A;  . POLYPECTOMY  10/20/2018   Procedure: POLYPECTOMY;  Surgeon: Thornton Park, MD;  Location[facility] ENDOSCOPY;  Service: Gastroenterology;;  . REVISON OF ARTERIOVENOUS FISTULA Left 03/29/863   Procedure: PLICATION OF ARTERIOVENOUS FISTULA;  Surgeon: Conrad Winston, MD;  Location: [facility];  Service: Vascular;  Laterality: Left;  Marland Kitchen VIDEO BRONCHOSCOPY WITH ENDOBRONCHIAL ULTRASOUND N/A 12/19/2018   Procedure: VIDEO BRONCHOSCOPY WITH ENDOBRONCHIAL ULTRASOUND;  Surgeon: Garner Nash, DO;  Location: [facility] OR;  Service: Thoracic;  Laterality: N/A;    REVIEW OF SYSTEMS:  A comprehensive review of systems was negative except for: Constitutional: positive for fatigue Respiratory: positive for cough and dyspnea on exertion Musculoskeletal: positive for muscle weakness   PHYSICAL EXAMINATION: General appearance: alert, cooperative, fatigued and no distress Head: Normocephalic, without obvious abnormality, atraumatic Neck: no adenopathy, no JVD, supple, symmetrical, trachea midline and thyroid not enlarged, symmetric, no tenderness/mass/nodules Lymph nodes: Cervical, supraclavicular, and axillary nodes  normal. Resp: clear to auscultation bilaterally Back: symmetric, no curvature. ROM normal. No CVA tenderness. Cardio: regular rate and rhythm, S1, S2 normal, no murmur, click, rub or gallop GI: soft, non-tender; bowel sounds normal; no masses,  no organomegaly Extremities: extremities normal, atraumatic, no cyanosis or edema  ECOG PERFORMANCE STATUS: 1 - Symptomatic but completely ambulatory  Blood pressure (!) 118/57, temperature 98 F (36.7 C), temperature source Temporal, resp. rate 17, height 5\' 11"  (1.803 m), weight 151 lb 1.6 oz (68.5 kg).  LABORATORY DATA: Lab Results  Component Value Date   WBC 3.8 (L) 03/04/2019   HGB 10.2 (L) 03/04/2019   HCT 31.7 (L) 03/04/2019   MCV 96.6 03/04/2019   PLT 160 03/04/2019      Chemistry      Component Value Date/Time   NA 137 02/25/2019 1020   NA 136 11/19/2018 1640   K 3.6 02/25/2019 1020   CL 95 (L) 02/25/2019 1020   CO2 24 02/25/2019 1020   BUN  38 (H) 02/25/2019 1020   BUN 19 11/19/2018 1640   CREATININE 5.46 (HH) 02/25/2019 1020      Component Value Date/Time   CALCIUM 8.8 (L) 02/25/2019 1020   CALCIUM 9.0 10/22/2009 2253   ALKPHOS 167 (H) 02/25/2019 1020   AST 15 02/25/2019 1020   ALT 8 02/25/2019 1020   BILITOT 0.7 02/25/2019 1020       RADIOGRAPHIC STUDIES: DG Chest 2 View  Result Date: 02/05/2019 CLINICAL DATA:  69 year old female with nausea vomiting and pleuritic chest pain. EXAM: CHEST - 2 VIEW COMPARISON:  Portable chest 10/22/2018 and earlier. CT chest 11/12/2018. FINDINGS: Spiculated right upper lobe lung mass demonstrated by CT has been poorly visible radiographically but might have regressed. Right pleural effusion probably persists, small. Stable cardiomegaly and mediastinal contours. No pneumothorax. No definite left pleural effusion. Pulmonary vascularity is unchanged with symmetric vascular congestion suspected. No new confluent opacity. Negative visible bowel gas pattern. No acute osseous abnormality  identified. IMPRESSION: 1. Spiculated right upper lobe lung mass has been better demonstrated by CT. 2. Cardiomegaly with continued pulmonary vascular congestion suspicious for mild interstitial edema. Small right pleural effusion suspected. 3. No pneumothorax or other acute cardiopulmonary abnormality. Electronically Signed   By: Genevie Ann M.D.   On: 02/05/2019 13:24   CT Renal Stone Study  Result Date: 02/05/2019 CLINICAL DATA:  Flank pain, generalized weakness EXAM: CT ABDOMEN AND PELVIS WITHOUT CONTRAST TECHNIQUE: Multidetector CT imaging of the abdomen and pelvis was performed following the standard protocol without IV contrast. COMPARISON:  None. FINDINGS: Lower chest: Small right pleural effusion. No left pleural effusion. Cardiomegaly. Hepatobiliary: No focal hepatic mass. Calcification along the inferior margin of the gallbladder which may reflect a gallstone or volume averaging from the adjacent small bowel. No intrahepatic biliary ductal dilatation. Pancreas: Unremarkable. No pancreatic ductal dilatation or surrounding inflammatory changes. Spleen: Normal in size without focal abnormality. Adrenals/Urinary Tract: Indeterminate 9 mm left adrenal nodule. Severe atrophy of bilateral kidneys. No urolithiasis or obstructive uropathy. Decompressed bladder. Stomach/Bowel: No bowel dilatation to suggest obstruction. Mild small bowel wall thickening in the left side of the abdomen as can be seen with enteritis secondary to an infectious or inflammatory etiology. No pneumatosis, pneumoperitoneum or portal venous gas. Diverticulosis without evidence of diverticulitis. Vascular/Lymphatic: Normal caliber abdominal aorta with mild atherosclerosis. No lymphadenopathy. Reproductive: Status post hysterectomy. No adnexal masses. Other: No fluid collection or hematoma.  Anasarca is present. Musculoskeletal: No acute osseous abnormality. No aggressive osseous lesion. Degenerative disease with disc height loss throughout  the lumbar spine. Schmorl's node at L3 and L4. IMPRESSION: 1. Mild small bowel wall thickening in the left side of the abdomen as can be seen with enteritis secondary to an infectious or inflammatory etiology. 2. Generalized anasarca. 3. Small right pleural effusion. 4. Diverticulosis without evidence of diverticulitis. 5. No bowel obstruction. Electronically Signed   By: Kathreen Devoid   On: 02/05/2019 14:51    ASSESSMENT AND PLAN: This is a very pleasant 69 years old African-American female with at least stage IIIc/IV (T2a, N3, M0/M1a) presented with right upper lobe lung mass in addition to mediastinal and right supraclavicular lymphadenopathy and suspicious right pleural effusion diagnosed in November 2020. I had a lengthy discussion with the patient and her daughter today about her current condition and treatment options. The patient was supposed to start a course of concurrent chemoradiation with weekly carboplatin and paclitaxel but she missed several appointments for her radiotherapy simulation.  She is currently rescheduled for CT simulation  on March 06, 2019. I recommended for the patient to keep her appointment with radiation oncology and if she proceed with radiation next week then will start the first dose of chemotherapy.  Otherwise I told the patient not to come unless she is ready to start the concurrent chemoradiation. She has follow-up appointment scheduled for next week for chemo, visit and lab and hopefully she will start her radiation also next week. For the end-stage renal disease, she is currently on hemodialysis Monday, Wednesday and Friday by Dr. Justin Mend. She was advised to call immediately if she has any other concerning symptoms in the interval. The patient voices understanding of current disease status and treatment options and is in agreement with the current care plan.  All questions were answered. The patient knows to call the clinic with any problems, questions or concerns.  We can certainly see the patient much sooner if necessary.   Disclaimer: This note was dictated with voice recognition software. Similar sounding words can inadvertently be transcribed and may not be corrected upon review.

## 2019-03-04 NOTE — Patient Instructions (Signed)
Steps to Quit Smoking Smoking tobacco is the leading cause of preventable death. It can affect almost every organ in the body. Smoking puts you and people around you at risk for many serious, long-lasting (chronic) diseases. Quitting smoking can be hard, but it is one of the best things that you can do for your health. It is never too late to quit. How do I get ready to quit? When you decide to quit smoking, make a plan to help you succeed. Before you quit:  Pick a date to quit. Set a date within the next 2 weeks to give you time to prepare.  Write down the reasons why you are quitting. Keep this list in places where you will see it often.  Tell your family, friends, and co-workers that you are quitting. Their support is important.  Talk with your doctor about the choices that may help you quit.  Find out if your health insurance will pay for these treatments.  Know the people, places, things, and activities that make you want to smoke (triggers). Avoid them. What first steps can I take to quit smoking?  Throw away all cigarettes at home, at work, and in your car.  Throw away the things that you use when you smoke, such as ashtrays and lighters.  Clean your car. Make sure to empty the ashtray.  Clean your home, including curtains and carpets. What can I do to help me quit smoking? Talk with your doctor about taking medicines and seeing a counselor at the same time. You are more likely to succeed when you do both.  If you are pregnant or breastfeeding, talk with your doctor about counseling or other ways to quit smoking. Do not take medicine to help you quit smoking unless your doctor tells you to do so. To quit smoking: Quit right away  Quit smoking totally, instead of slowly cutting back on how much you smoke over a period of time.  Go to counseling. You are more likely to quit if you go to counseling sessions regularly. Take medicine You may take medicines to help you quit. Some  medicines need a prescription, and some you can buy over-the-counter. Some medicines may contain a drug called nicotine to replace the nicotine in cigarettes. Medicines may:  Help you to stop having the desire to smoke (cravings).  Help to stop the problems that come when you stop smoking (withdrawal symptoms). Your doctor may ask you to use:  Nicotine patches, gum, or lozenges.  Nicotine inhalers or sprays.  Non-nicotine medicine that is taken by mouth. Find resources Find resources and other ways to help you quit smoking and remain smoke-free after you quit. These resources are most helpful when you use them often. They include:  Online chats with a counselor.  Phone quitlines.  Printed self-help materials.  Support groups or group counseling.  Text messaging programs.  Mobile phone apps. Use apps on your mobile phone or tablet that can help you stick to your quit plan. There are many free apps for mobile phones and tablets as well as websites. Examples include Quit Guide from the CDC and smokefree.gov  What things can I do to make it easier to quit?   Talk to your family and friends. Ask them to support and encourage you.  Call a phone quitline (1-800-QUIT-NOW), reach out to support groups, or work with a counselor.  Ask people who smoke to not smoke around you.  Avoid places that make you want to smoke,   such as: ? Bars. ? Parties. ? Smoke-break areas at work.  Spend time with people who do not smoke.  Lower the stress in your life. Stress can make you want to smoke. Try these things to help your stress: ? Getting regular exercise. ? Doing deep-breathing exercises. ? Doing yoga. ? Meditating. ? Doing a body scan. To do this, close your eyes, focus on one area of your body at a time from head to toe. Notice which parts of your body are tense. Try to relax the muscles in those areas. How will I feel when I quit smoking? Day 1 to 3 weeks Within the first 24 hours,  you may start to have some problems that come from quitting tobacco. These problems are very bad 2-3 days after you quit, but they do not often last for more than 2-3 weeks. You may get these symptoms:  Mood swings.  Feeling restless, nervous, angry, or annoyed.  Trouble concentrating.  Dizziness.  Strong desire for high-sugar foods and nicotine.  Weight gain.  Trouble pooping (constipation).  Feeling like you may vomit (nausea).  Coughing or a sore throat.  Changes in how the medicines that you take for other issues work in your body.  Depression.  Trouble sleeping (insomnia). Week 3 and afterward After the first 2-3 weeks of quitting, you may start to notice more positive results, such as:  Better sense of smell and taste.  Less coughing and sore throat.  Slower heart rate.  Lower blood pressure.  Clearer skin.  Better breathing.  Fewer sick days. Quitting smoking can be hard. Do not give up if you fail the first time. Some people need to try a few times before they succeed. Do your best to stick to your quit plan, and talk with your doctor if you have any questions or concerns. Summary  Smoking tobacco is the leading cause of preventable death. Quitting smoking can be hard, but it is one of the best things that you can do for your health.  When you decide to quit smoking, make a plan to help you succeed.  Quit smoking right away, not slowly over a period of time.  When you start quitting, seek help from your doctor, family, or friends. This information is not intended to replace advice given to you by your health care provider. Make sure you discuss any questions you have with your health care provider. Document Revised: 10/25/2018 Document Reviewed: 04/20/2018 Elsevier Patient Education  2020 Elsevier Inc.  

## 2019-03-05 DIAGNOSIS — D509 Iron deficiency anemia, unspecified: Secondary | ICD-10-CM | POA: Diagnosis not present

## 2019-03-05 DIAGNOSIS — Z992 Dependence on renal dialysis: Secondary | ICD-10-CM | POA: Diagnosis not present

## 2019-03-05 DIAGNOSIS — N2581 Secondary hyperparathyroidism of renal origin: Secondary | ICD-10-CM | POA: Diagnosis not present

## 2019-03-05 DIAGNOSIS — N186 End stage renal disease: Secondary | ICD-10-CM | POA: Diagnosis not present

## 2019-03-06 ENCOUNTER — Telehealth: Payer: Self-pay

## 2019-03-06 ENCOUNTER — Other Ambulatory Visit: Payer: Self-pay

## 2019-03-06 ENCOUNTER — Ambulatory Visit
Admission: RE | Admit: 2019-03-06 | Discharge: 2019-03-06 | Disposition: A | Discharge status: Home / Self Care | Payer: Medicare Other | Source: Ambulatory Visit | Attending: Radiation Oncology | Admitting: Radiation Oncology

## 2019-03-06 DIAGNOSIS — C3411 Malignant neoplasm of upper lobe, right bronchus or lung: Secondary | ICD-10-CM | POA: Diagnosis not present

## 2019-03-06 DIAGNOSIS — C3491 Malignant neoplasm of unspecified part of right bronchus or lung: Secondary | ICD-10-CM

## 2019-03-06 DIAGNOSIS — Z510 Encounter for antineoplastic radiation therapy: Secondary | ICD-10-CM | POA: Diagnosis not present

## 2019-03-06 NOTE — Progress Notes (Signed)
  Radiation Oncology         [phone] ________________________________  Name: Jessica Clarke MRN: [id number]  Date: 03/06/2019  DOB: May 14, 1950  SIMULATION AND TREATMENT PLANNING NOTE    ICD-10-CM   1. Adenocarcinoma of right lung, stage 3 (HCC)  C34.91     DIAGNOSIS:  Stage IIIanon-small cell lung cancer, adenocarcinoma presented with right upper lobe lung mass in addition toright hilar andmediastinal lymphadenopathy. Diagnosed in November 2020.  NARRATIVE:  The patient was brought to the [facility].  Identity was confirmed.  All relevant records and images related to the planned course of therapy were reviewed.  The patient freely provided informed written consent to proceed with treatment after reviewing the details related to the planned course of therapy. The consent form was witnessed and verified by the simulation staff.  Then, the patient was set-up in a stable reproducible  supine position for radiation therapy.  CT images were obtained.  Surface markings were placed.  The CT images were loaded into the planning software.  Then the target and avoidance structures were contoured.  Treatment planning then occurred.  The radiation prescription was entered and confirmed.  Then, I designed and supervised the construction of a total of 6 medically necessary complex treatment devices.  I have requested : 3D Simulation  I have requested a DVH of the following structures: GTV, CTV, PTV, heart, lungs, spinal cord, esophagus.  I have ordered:dose calc.  PLAN:  The patient will receive 60 Gy in 30 fractions along with radiosensitizing chemotherapy.   Special Treatment Procedure Note: The patient will be receiving radiosensitizing chemotherapy. Given the potential of increased toxicities related to combined therapy and the necessity for close monitoring of the patient and blood work, this constitutes a special treatment procedure. -----------------------------------  Blair Promise, PhD, MD  This document serves as a record of services personally performed by Gery Pray, MD. It was created on his behalf by Wilburn Mylar, a trained medical scribe. The creation of this record is based on the scribe's personal observations and the provider's statements to them. This document has been checked and approved by the attending provider.

## 2019-03-07 ENCOUNTER — Telehealth: Payer: Self-pay

## 2019-03-07 DIAGNOSIS — N186 End stage renal disease: Secondary | ICD-10-CM | POA: Diagnosis not present

## 2019-03-07 DIAGNOSIS — N2581 Secondary hyperparathyroidism of renal origin: Secondary | ICD-10-CM | POA: Diagnosis not present

## 2019-03-07 DIAGNOSIS — Z992 Dependence on renal dialysis: Secondary | ICD-10-CM | POA: Diagnosis not present

## 2019-03-07 DIAGNOSIS — D509 Iron deficiency anemia, unspecified: Secondary | ICD-10-CM | POA: Diagnosis not present

## 2019-03-10 DIAGNOSIS — N2581 Secondary hyperparathyroidism of renal origin: Secondary | ICD-10-CM | POA: Diagnosis not present

## 2019-03-10 DIAGNOSIS — D509 Iron deficiency anemia, unspecified: Secondary | ICD-10-CM | POA: Diagnosis not present

## 2019-03-10 DIAGNOSIS — Z510 Encounter for antineoplastic radiation therapy: Secondary | ICD-10-CM | POA: Diagnosis not present

## 2019-03-10 DIAGNOSIS — N186 End stage renal disease: Secondary | ICD-10-CM | POA: Diagnosis not present

## 2019-03-10 DIAGNOSIS — Z992 Dependence on renal dialysis: Secondary | ICD-10-CM | POA: Diagnosis not present

## 2019-03-10 DIAGNOSIS — C3411 Malignant neoplasm of upper lobe, right bronchus or lung: Secondary | ICD-10-CM | POA: Diagnosis not present

## 2019-03-10 NOTE — Progress Notes (Addendum)
[facility] OFFICE PROGRESS NOTE  [name], [address] [place] [address]  DIAGNOSIS: Stage IIIb/IV (T2a, N3, M0/M1a) non-small cell lung cancer, adenocarcinoma presented with right upper lobe lung mass in addition to mediastinal and right supraclavicular lymphadenopathy and suspicious right pleural effusion diagnosed in November 2020.  PRIOR THERAPY: None  CURRENT THERAPY: Concurrent chemoradiation with weekly carboplatin for AUC of 2 and paclitaxel 45 NG/M2.  First dose expected on 03/11/2019  INTERVAL HISTORY: Jessica Clarke 69 y.o. female returns to the clinic today for a follow-up visit. The patient is a poor historian and the history is somewhat limited. The patient is here today to start her first cycle of concurrent chemoradiation.  She is scheduled to start her radiation treatments next week on 2/1. She completed her CT simulation on 1/21.  There is been some difficulty/delay in initiating this patient's treatments for the last several months secondary to the patient not arriving to several appointments as well as the patient having dialysis appointments 3 days a week for end-stage renal disease.  Today, patient states that she is "doing okay".  She denies any fever, chills, night sweats, or weight loss.  She states that her breathing is "usual" for her for which she typically experiences dyspnea on exertion as well as an occasional cough.  She denies any headache or visual changes.  She denies any nausea, vomiting, or constipation.  She states that she has been experiencing diarrhea frequently for which she has been taking Imodium.  It is unclear if she has been taking imodium more than once a day if needed. She is here for evaluation before starting cycle #1.   MEDICAL HISTORY: Past Medical History:  Diagnosis Date  . Abnormal Pap smear    ASCUS ?HGSIL  . Anemia   . Arthritis   . Cancer ([place])    lung ( per daughter)  . Cervical dysplasia    . CHF (congestive heart failure) ([facility] [place])   . Chronic kidney disease   . Constipation   . Constipation   . Dyspnea   . GERD (gastroesophageal reflux disease)   . Gout   . Hyperlipidemia   . Hypertension   . Myocardial infarction ([place]) 2015  . Obesity   . Pneumonia   . Uterine polyp     ALLERGIES:  has No Known Allergies.  MEDICATIONS:  Current Outpatient Medications  Medication Sig Dispense Refill  . acetaminophen (TYLENOL) 325 MG tablet Take 325-650 mg by mouth every 8 (eight) hours as needed for mild pain.     Marland Kitchen allopurinol (ZYLOPRIM) 100 MG tablet Take 100 mg by mouth daily.      . Amino Acids-Protein Hydrolys (FEEDING SUPPLEMENT, PRO-STAT SUGAR FREE 64,) LIQD Take 30 mLs by mouth 2 (two) times daily. 887 mL 0  . atorvastatin (LIPITOR) 10 MG tablet Take 10 mg by mouth at bedtime.   0  . B Complex-Folic Acid (B COMPLEX FORMULA 1) TABS Take 1 tablet by mouth daily.     . calcitRIOL (ROCALTROL) 0.5 MCG capsule Take 2 capsules (1 mcg total) by mouth every Monday, Wednesday, and Friday with hemodialysis. 30 capsule 0  . cinacalcet (SENSIPAR) 30 MG tablet Take 1 tablet (30 mg total) by mouth daily with supper. 60 tablet 0  . colchicine 0.6 MG tablet Take 0.6 mg by mouth daily as needed (as directed for gout flares or pain).     . Darbepoetin Alfa (ARANESP) 60 MCG/0.3ML SOSY injection Inject 0.3 mLs (60  mcg total) into the vein every Friday with hemodialysis. 4.2 mL   . loperamide (IMODIUM) 2 MG capsule Take 2 mg by mouth daily as needed for diarrhea or loose stools.     . midodrine (PROAMATINE) 10 MG tablet Take 1 tablet (10 mg total) by mouth 2 (two) times daily with a meal. 60 tablet 0  . multivitamin (RENA-VIT) TABS tablet Take 1 tablet by mouth at bedtime. 30 tablet 0  . ondansetron (ZOFRAN ODT) 4 MG disintegrating tablet 71m ODT q4 hours prn nausea/vomit 10 tablet 0  . prochlorperazine (COMPAZINE) 10 MG tablet Take 1 tablet (10 mg total) by mouth every 6 (six) hours as needed for  nausea or vomiting. 30 tablet 1  . thiamine (VITAMIN B-1) 100 MG tablet Take 100 mg by mouth daily.    . VELPHORO 500 MG chewable tablet Chew 1,000 mg by mouth 3 (three) times daily with meals.     . Vitamin D, Cholecalciferol, 25 MCG (1000 UT) TABS Take 1,000 Units by mouth daily.      No current facility-administered medications for this visit.   Facility-Administered Medications Ordered in Other Visits  Medication Dose Route Frequency Provider Last Rate Last Admin  . 0.9 %  sodium chloride infusion   Intravenous Once MCurt Bears MD      . CARBOplatin (PARAPLATIN) 70 mg in sodium chloride 0.9 % 100 mL chemo infusion  70 mg Intravenous Once MCurt Bears MD      . dexamethasone (DECADRON) injection 10 mg  10 mg Intravenous Once MCurt Bears MD      . diphenhydrAMINE (BENADRYL) injection 50 mg  50 mg Intravenous Once MCurt Bears MD      . famotidine (PEPCID) IVPB 20 mg premix  20 mg Intravenous Once MCurt Bears MD      . PACLitaxel (TAXOL) 84 mg in sodium chloride 0.9 % 250 mL chemo infusion (</= 838mm2)  45 mg/m2 (Treatment Plan Recorded) Intravenous Once MoCurt BearsMD      . palonosetron (ALOXI) injection 0.25 mg  0.25 mg Intravenous Once MoCurt BearsMD        SURGICAL HISTORY:  Past Surgical History:  Procedure Laterality Date  . AV FISTULA PLACEMENT  01/18/2012   Procedure: ARTERIOVENOUS (AV) FISTULA CREATION;  Surgeon: ChAngelia MouldMD;  [facility] Service: Vascular;  Laterality: Left;  . BIOPSY  10/20/2018   Procedure: BIOPSY;  Surgeon: BeThornton ParkMD;  Location: [facility] Service: Gastroenterology;;  . COLONOSCOPY WITH PROPOFOL N/A 10/20/2018   Procedure: COLONOSCOPY WITH PROPOFOL;  Surgeon: BeThornton ParkMD;  [facility]: [facility]ology;  Laterality: N/A;  . DILATION AND CURETTAGE OF UTERUS    . HYSTEROSCOPY    . LEEP    . LEFT HEART CATHETERIZATION WITH CORONARY ANGIOGRAM N/A 08/26/2013    Procedure: LEFT HEART CATHETERIZATION WITH CORONARY ANGIOGRAM;  Surgeon: DaLeonie ManMD;  [facility];  Service: Cardiovascular;  Laterality: N/A;  . POLYPECTOMY  10/20/2018   Procedure: POLYPECTOMY;  Surgeon: BeThornton ParkMD;  [facility];  Service: Gastroenterology;;  . REVISON OF ARTERIOVENOUS FISTULA Left 02/16/05/6808 Procedure: PLICATION OF ARTERIOVENOUS FISTULA;  Surgeon: BrConrad BurlingtonMD;  [facility] V[facility];  Laterality: Left;  . Marland KitchenIDEO BRONCHOSCOPY WITH ENDOBRONCHIAL ULTRASOUND N/A 12/19/2018   Procedure: VIDEO BRONCHOSCOPY WITH ENDOBRONCHIAL ULTRASOUND;  Surgeon: IcGarner NashDO;  [facility] Service: Thoracic;  Laterality: N/A;    REVIEW OF SYSTEMS:  Review of Systems  Constitutional: Positive for fatigue and generalized weakness. Negative for appetite change, chills, fever and unexpected weight change.  HENT: Negative for mouth sores, nosebleeds, sore throat and trouble swallowing.   Eyes: Negative for eye problems and icterus.  Respiratory: Positive for dyspnea on exertion and baseline cough. Negative for hemoptysis and wheezing.   Cardiovascular: Negative for chest pain and leg swelling.  Gastrointestinal: Positive for frequent diarrhea. Negative for abdominal pain, constipation, nausea and vomiting.  Genitourinary: Negative for bladder incontinence, difficulty urinating, dysuria, frequency and hematuria.   Musculoskeletal: Negative for back pain, neck pain and neck stiffness.  Skin: Negative for itching and rash.  Neurological: Negative for dizziness, extremity weakness, gait problem, headaches, light-headedness and seizures.  Hematological: Negative for adenopathy. Does not bruise/bleed easily.  Psychiatric/Behavioral: Negative for depression and sleep disturbance. The patient is not nervous/anxious. Positive for confusion regarding schedule/treatment.   PHYSICAL EXAMINATION:  Blood pressure (!) 141/77, pulse 72,  temperature 98.1 F (36.7 C), temperature source Temporal, resp. rate 18, height 5' 11"  (1.803 m), weight 148 lb 3.2 oz (67.2 kg), SpO2 92 %.  ECOG PERFORMANCE STATUS: 2 - Symptomatic, <50% confined to bed  Physical Exam  Constitutional: Oriented to person, place, and time and chronically ill appearing female. Examined in wheelchair. HENT:  Head: Normocephalic and atraumatic.  Mouth/Throat: Oropharynx is clear and moist. No oropharyngeal exudate.  Eyes: Conjunctivae are normal. Right eye exhibits no discharge. Left eye exhibits no discharge. No scleral icterus. Arcus senilis noted bilaterally.  Neck: Normal range of motion. Neck supple.  Cardiovascular: Murmur noted. Normal rate, regular rhythm, and intact distal pulses.   Pulmonary/Chest: Effort normal. Some crackles noted bilaterally at the lung bases. No respiratory distress. No wheezes. No rales.  Abdominal: Soft. Bowel sounds are normal. Exhibits no distension and no mass. There is no tenderness.  Musculoskeletal: Normal range of motion. Exhibits no edema.  Lymphadenopathy:    No cervical adenopathy.  Neurological: Alert and oriented to person, place, and time. Exhibits normal muscle tone. Examined in wheelchair. Skin: Skin is warm and dry. No rash noted. Not diaphoretic. No erythema. No pallor.  Psychiatric: Mood normal. Patient seemed somewhat confused about her treatment plan and upcoming appointments. Poor historian.  Vitals reviewed.  LABORATORY DATA: Lab Results  Component Value Date   WBC 3.9 (L) 03/11/2019   HGB 10.3 (L) 03/11/2019   HCT 32.2 (L) 03/11/2019   MCV 97.0 03/11/2019   PLT 145 (L) 03/11/2019      Chemistry      Component Value Date/Time   NA 133 (L) 03/11/2019 1027   NA 136 11/19/2018 1640   K 3.9 03/11/2019 1027   CL 90 (L) 03/11/2019 1027   CO2 28 03/11/2019 1027   BUN 25 (H) 03/11/2019 1027   BUN 19 11/19/2018 1640   CREATININE 3.95 (HH) 03/11/2019 1027      Component Value Date/Time   CALCIUM  9.4 03/11/2019 1027   CALCIUM 9.0 10/22/2009 2253   ALKPHOS 166 (H) 03/11/2019 1027   AST 18 03/11/2019 1027   ALT 8 03/11/2019 1027   BILITOT 1.0 03/11/2019 1027       RADIOGRAPHIC STUDIES:  No results found.   ASSESSMENT/PLAN:  Stage IIIanon-small cell lung cancer, adenocarcinoma presented with right upper lobe lung mass in addition toright hilar andmediastinal lymphadenopathy. Diagnosed in November 2020.Her PDL1 is 1% and she has no actionable mutations.   She is currently undergoing weekly concurrent chemoradiation with carboplatin for an AUC of 2 and paclitaxel  45 mg/m.  She is scheduled to start her first dose of treatment today.  She is scheduled to start radiation next week. This patient's treatment has been delayed by several months due to several missed appointments as well as scheduling challenges with her dialysis.   Labs were reviewed which shows ESRD for which she is on dialysis. She also has persistent/stable anemia. Patient is uncooperative for chemotherapy today. I spoke to the patient's daughter, [name], about this. [name] reports that the patient had dialysis yesterday and denies any changes in her mentation today. She will come and pick her mother up.   We will see the patient back for a follow up visit in 1 week and to possibly start chemotherapy if the patient is interested in pursuing treatment.  I printed her schedule and reviewed it with the patient today. Strongly encouraged her to keep her schedule at home for her reference. Expressed the importance of starting her radiation next week as scheduled if she is planning to undergo treatment.   Discussed that she may take imodium more than once daily for optimal control of her diarrhea. Reiterated manufacturers label for imodium states "Initial: 4 mg, followed by 2 mg after each loose stool (maximum: 16 mg/day)"  She was instructed to try using delsym OTC for cough.   She will continue with dialysis under the  care of Dr. Justin Mend  The patient was advised to call immediately if she has any concerning symptoms in the interval. The patient voices understanding of current disease status and treatment options and is in agreement with the current care plan. All questions were answered. The patient knows to call the clinic with any problems, questions or concerns. We can certainly see the patient much sooner if necessary    No orders of the defined types were placed in this encounter.    [place], PA-C 03/11/19

## 2019-03-11 ENCOUNTER — Inpatient Hospital Stay (HOSPITAL_BASED_OUTPATIENT_CLINIC_OR_DEPARTMENT_OTHER): Payer: Medicare Other | Admitting: Physician Assistant

## 2019-03-11 ENCOUNTER — Other Ambulatory Visit: Payer: Medicare Other

## 2019-03-11 ENCOUNTER — Ambulatory Visit: Payer: Medicare Other

## 2019-03-11 ENCOUNTER — Other Ambulatory Visit: Payer: Self-pay

## 2019-03-11 ENCOUNTER — Encounter: Payer: Self-pay | Admitting: Physician Assistant

## 2019-03-11 ENCOUNTER — Inpatient Hospital Stay: Payer: Medicare Other

## 2019-03-11 VITALS — BP 141/77 | HR 72 | Temp 98.1°F | Resp 18 | Ht 71.0 in | Wt 148.2 lb

## 2019-03-11 DIAGNOSIS — Z5111 Encounter for antineoplastic chemotherapy: Secondary | ICD-10-CM | POA: Diagnosis not present

## 2019-03-11 DIAGNOSIS — C3491 Malignant neoplasm of unspecified part of right bronchus or lung: Secondary | ICD-10-CM

## 2019-03-11 DIAGNOSIS — N186 End stage renal disease: Secondary | ICD-10-CM

## 2019-03-11 DIAGNOSIS — D649 Anemia, unspecified: Secondary | ICD-10-CM | POA: Diagnosis not present

## 2019-03-11 DIAGNOSIS — Z992 Dependence on renal dialysis: Secondary | ICD-10-CM | POA: Diagnosis not present

## 2019-03-11 DIAGNOSIS — C3411 Malignant neoplasm of upper lobe, right bronchus or lung: Secondary | ICD-10-CM | POA: Diagnosis not present

## 2019-03-11 LAB — CMP (CANCER CENTER ONLY)
ALT: 8 U/L (ref 0–44)
AST: 18 U/L (ref 15–41)
Albumin: 3 g/dL — ABNORMAL LOW (ref 3.5–5.0)
Alkaline Phosphatase: 166 U/L — ABNORMAL HIGH (ref 38–126)
Anion gap: 15 (ref 5–15)
BUN: 25 mg/dL — ABNORMAL HIGH (ref 8–23)
CO2: 28 mmol/L (ref 22–32)
Calcium: 9.4 mg/dL (ref 8.9–10.3)
Chloride: 90 mmol/L — ABNORMAL LOW (ref 98–111)
Creatinine: 3.95 mg/dL (ref 0.44–1.00)
GFR, Est AFR Am: 13 mL/min — ABNORMAL LOW (ref >60)
GFR, Estimated: 11 mL/min — ABNORMAL LOW (ref >60)
Glucose, Bld: 69 mg/dL — ABNORMAL LOW (ref 70–99)
Potassium: 3.9 mmol/L (ref 3.5–5.1)
Sodium: 133 mmol/L — ABNORMAL LOW (ref 135–145)
Total Bilirubin: 1 mg/dL (ref 0.3–1.2)
Total Protein: 8.5 g/dL — ABNORMAL HIGH (ref 6.5–8.1)

## 2019-03-11 LAB — CBC WITH DIFFERENTIAL (CANCER CENTER ONLY)
Abs Immature Granulocytes: 0.01 10*3/uL (ref 0.00–0.07)
Basophils Absolute: 0 10*3/uL (ref 0.0–0.1)
Basophils Relative: 1 %
Eosinophils Absolute: 0 10*3/uL (ref 0.0–0.5)
Eosinophils Relative: 1 %
HCT: 32.2 % — ABNORMAL LOW (ref 36.0–46.0)
Hemoglobin: 10.3 g/dL — ABNORMAL LOW (ref 12.0–15.0)
Immature Granulocytes: 0 %
Lymphocytes Relative: 12 %
Lymphs Abs: 0.5 10*3/uL — ABNORMAL LOW (ref 0.7–4.0)
MCH: 31 pg (ref 26.0–34.0)
MCHC: 32 g/dL (ref 30.0–36.0)
MCV: 97 fL (ref 80.0–100.0)
Monocytes Absolute: 0.1 10*3/uL (ref 0.1–1.0)
Monocytes Relative: 3 %
Neutro Abs: 3.2 10*3/uL (ref 1.7–7.7)
Neutrophils Relative %: 83 %
Platelet Count: 145 10*3/uL — ABNORMAL LOW (ref 150–400)
RBC: 3.32 MIL/uL — ABNORMAL LOW (ref 3.87–5.11)
RDW: 17.5 % — ABNORMAL HIGH (ref 11.5–15.5)
WBC Count: 3.9 10*3/uL — ABNORMAL LOW (ref 4.0–10.5)
nRBC: 0 % (ref 0.0–0.2)

## 2019-03-11 MED ORDER — PALONOSETRON HCL INJECTION 0.25 MG/5ML: 0.2500 mg | Freq: Once | INTRAVENOUS | Status: DC

## 2019-03-11 MED ORDER — FAMOTIDINE IN NACL 20-0.9 MG/50ML-% IV SOLN: 20.0000 mg | Freq: Once | INTRAVENOUS | Status: DC

## 2019-03-11 MED ORDER — DIPHENHYDRAMINE HCL 50 MG/ML IJ SOLN: 50.0000 mg | Freq: Once | INTRAMUSCULAR | Status: DC

## 2019-03-11 MED ORDER — SODIUM CHLORIDE 0.9 % IV SOLN: 69.2000 mg | Freq: Once | INTRAVENOUS | Status: DC

## 2019-03-11 MED ORDER — SODIUM CHLORIDE 0.9 % IV SOLN: 45.0000 mg/m2 | Freq: Once | INTRAVENOUS | Status: DC

## 2019-03-11 MED ORDER — SODIUM CHLORIDE 0.9 % IV SOLN
Freq: Once | INTRAVENOUS | Status: AC
  Filled 2019-03-11: qty 250

## 2019-03-11 MED ORDER — DEXAMETHASONE SODIUM PHOSPHATE 10 MG/ML IJ SOLN: 10.0000 mg | Freq: Once | INTRAMUSCULAR | Status: DC

## 2019-03-11 NOTE — Progress Notes (Signed)
Cr 3.95, patient with ESRD and PA noted labs upon review in note.

## 2019-03-11 NOTE — Progress Notes (Signed)
Patient uncooperative with getting treated today. Blood glucose 69, she refused to eat or drink anything to improve this. Refused to sign consent for treatment. Complaining of chills. Dr. Julien Nordmann informed and he ordered treatment be held today and patient be discharged home. Patient informed and her daughter called and informed, she verbalized understanding, this nurse advised that she calls the MD's office with further questions or clarifications and she verbalized understanding.

## 2019-03-12 ENCOUNTER — Other Ambulatory Visit: Payer: Medicare Other | Admitting: Licensed Clinical Social Worker

## 2019-03-12 DIAGNOSIS — Z515 Encounter for palliative care: Secondary | ICD-10-CM

## 2019-03-12 DIAGNOSIS — D509 Iron deficiency anemia, unspecified: Secondary | ICD-10-CM | POA: Diagnosis not present

## 2019-03-12 DIAGNOSIS — N2581 Secondary hyperparathyroidism of renal origin: Secondary | ICD-10-CM | POA: Diagnosis not present

## 2019-03-12 DIAGNOSIS — N186 End stage renal disease: Secondary | ICD-10-CM | POA: Diagnosis not present

## 2019-03-12 DIAGNOSIS — Z992 Dependence on renal dialysis: Secondary | ICD-10-CM | POA: Diagnosis not present

## 2019-03-15 NOTE — Progress Notes (Signed)
COMM[facility]ITY PALLIATIVE CARE SW NOTE  PATIENT NAME: Jessica Clarke DOB: 12-17-1950 MRN: [id number]  PRIMARY CARE PROVIDER: [name], FNP  RESPONSIBLE PARTY:  Acct ID - Guarantor Home Phone Work Phone Relationship Acct Type  [id number] Wilhelmina [phone]  Self P/F     [address], [place], [place] [address]   Due to the COVID-19 crisis, this virtual check-in visit was done via telephone from my office and it was initiated and consent given by thispatient.  PLAN OF CARE and INTERVENTIONS:             1. GOALS OF CARE/ ADVANCE CARE PLANNING:  Goal is not to be hospitalized.  Patient wants to be pain free and become stronger.  She is a full code. 2. SOCIAL/EMOTIONAL/SPIRITUAL ASSESSMENT/ INTERVENTIONS:  SW conducted a Sales executive visit with patient's daughter, [name].  She reports increased stress while attempting to care for her mother.  Patient cancelled her chemo therapy treatment this week and missed a dialysis treatment last week.  [name] is unclear if patient has early dementia or is manipulating her.  Discussed the MOST form which will be presented during the next home visit.  SW provided active listening and supportive counseling. 3. PATIENT/CAREGIVER EDUCATION/ COPING:  Daughter copes by problem-solving. 4. PERSONAL EMERGENCY PLAN:  EMS is contacted. 5. COMMUNITY RESOURCES COORDINATION/ HEALTH CARE NAVIGATION:  None. 6. FINANCIAL/LEGAL CONCERNS/INTERVENTIONS:  Patient is on a fixed income.     SOCIAL HX:  Social History   Tobacco Use  . Smoking status: Current Every Day Smoker    Packs/day: 0.25    Years: 30.00    Pack years: 7.50    Types: Cigarettes  . Smokeless tobacco: Never Used  . Tobacco comment: smoking cessation  Substance Use Topics  . Alcohol use: Not Currently    CODE STATUS:  Full Code  ADVANCED DIRECTIVES: N MOST FORM COMPLETE:  N HOSPICE EDUCATION PROVIDED:  N PPS:  No change in appetite.  She independently ambulates. Duration of visit  and documentation:  30 minutes.      Creola Corn Sollie Vultaggio, LCSW

## 2019-03-16 NOTE — Progress Notes (Signed)
Not seen

## 2019-03-17 ENCOUNTER — Ambulatory Visit: Payer: Self-pay

## 2019-03-17 ENCOUNTER — Emergency Department (HOSPITAL_COMMUNITY)
Admission: EM | Admit: 2019-03-17 | Discharge: 2019-03-17 | Disposition: A | Discharge status: Home / Self Care | Payer: Medicare Other | Source: Home / Self Care | Attending: Emergency Medicine | Admitting: Emergency Medicine

## 2019-03-17 ENCOUNTER — Emergency Department (HOSPITAL_COMMUNITY): Payer: Medicare Other

## 2019-03-17 ENCOUNTER — Inpatient Hospital Stay (HOSPITAL_COMMUNITY)
Admission: EM | Admit: 2019-03-17 | Discharge: 2019-04-14 | DRG: 640 | Disposition: E | Payer: Medicare Other | Attending: Internal Medicine | Admitting: Internal Medicine

## 2019-03-17 ENCOUNTER — Ambulatory Visit: Admission: RE | Admit: 2019-03-17 | Payer: Medicare Other | Source: Ambulatory Visit | Admitting: Radiation Oncology

## 2019-03-17 ENCOUNTER — Encounter (HOSPITAL_COMMUNITY): Payer: Self-pay

## 2019-03-17 ENCOUNTER — Other Ambulatory Visit: Payer: Self-pay

## 2019-03-17 DIAGNOSIS — Z800 Family history of malignant neoplasm of digestive organs: Secondary | ICD-10-CM

## 2019-03-17 DIAGNOSIS — R1032 Left lower quadrant pain: Secondary | ICD-10-CM | POA: Diagnosis not present

## 2019-03-17 DIAGNOSIS — R0902 Hypoxemia: Secondary | ICD-10-CM | POA: Diagnosis not present

## 2019-03-17 DIAGNOSIS — I132 Hypertensive heart and chronic kidney disease with heart failure and with stage 5 chronic kidney disease, or end stage renal disease: Secondary | ICD-10-CM | POA: Insufficient documentation

## 2019-03-17 DIAGNOSIS — G4489 Other headache syndrome: Secondary | ICD-10-CM | POA: Diagnosis not present

## 2019-03-17 DIAGNOSIS — K5900 Constipation, unspecified: Secondary | ICD-10-CM | POA: Diagnosis present

## 2019-03-17 DIAGNOSIS — R0602 Shortness of breath: Secondary | ICD-10-CM

## 2019-03-17 DIAGNOSIS — Z9115 Patient's noncompliance with renal dialysis: Secondary | ICD-10-CM

## 2019-03-17 DIAGNOSIS — N2581 Secondary hyperparathyroidism of renal origin: Secondary | ICD-10-CM | POA: Diagnosis present

## 2019-03-17 DIAGNOSIS — Z7189 Other specified counseling: Secondary | ICD-10-CM

## 2019-03-17 DIAGNOSIS — R42 Dizziness and giddiness: Secondary | ICD-10-CM | POA: Diagnosis not present

## 2019-03-17 DIAGNOSIS — K621 Rectal polyp: Secondary | ICD-10-CM | POA: Diagnosis present

## 2019-03-17 DIAGNOSIS — D631 Anemia in chronic kidney disease: Secondary | ICD-10-CM

## 2019-03-17 DIAGNOSIS — J810 Acute pulmonary edema: Secondary | ICD-10-CM | POA: Diagnosis present

## 2019-03-17 DIAGNOSIS — I5042 Chronic combined systolic (congestive) and diastolic (congestive) heart failure: Secondary | ICD-10-CM | POA: Insufficient documentation

## 2019-03-17 DIAGNOSIS — Z681 Body mass index (BMI) 19 or less, adult: Secondary | ICD-10-CM

## 2019-03-17 DIAGNOSIS — M1990 Unspecified osteoarthritis, unspecified site: Secondary | ICD-10-CM | POA: Diagnosis present

## 2019-03-17 DIAGNOSIS — Z20822 Contact with and (suspected) exposure to covid-19: Secondary | ICD-10-CM | POA: Diagnosis not present

## 2019-03-17 DIAGNOSIS — R4182 Altered mental status, unspecified: Secondary | ICD-10-CM | POA: Diagnosis not present

## 2019-03-17 DIAGNOSIS — Y9289 Other specified places as the place of occurrence of the external cause: Secondary | ICD-10-CM | POA: Insufficient documentation

## 2019-03-17 DIAGNOSIS — J449 Chronic obstructive pulmonary disease, unspecified: Secondary | ICD-10-CM | POA: Diagnosis present

## 2019-03-17 DIAGNOSIS — E8770 Fluid overload, unspecified: Principal | ICD-10-CM | POA: Diagnosis present

## 2019-03-17 DIAGNOSIS — I214 Non-ST elevation (NSTEMI) myocardial infarction: Secondary | ICD-10-CM | POA: Diagnosis present

## 2019-03-17 DIAGNOSIS — R112 Nausea with vomiting, unspecified: Secondary | ICD-10-CM | POA: Insufficient documentation

## 2019-03-17 DIAGNOSIS — N186 End stage renal disease: Secondary | ICD-10-CM

## 2019-03-17 DIAGNOSIS — E43 Unspecified severe protein-calorie malnutrition: Secondary | ICD-10-CM | POA: Diagnosis present

## 2019-03-17 DIAGNOSIS — Z7401 Bed confinement status: Secondary | ICD-10-CM | POA: Diagnosis not present

## 2019-03-17 DIAGNOSIS — Z66 Do not resuscitate: Secondary | ICD-10-CM | POA: Diagnosis present

## 2019-03-17 DIAGNOSIS — Z992 Dependence on renal dialysis: Secondary | ICD-10-CM | POA: Diagnosis not present

## 2019-03-17 DIAGNOSIS — I509 Heart failure, unspecified: Secondary | ICD-10-CM | POA: Diagnosis not present

## 2019-03-17 DIAGNOSIS — N189 Chronic kidney disease, unspecified: Secondary | ICD-10-CM

## 2019-03-17 DIAGNOSIS — Z91199 Patient's noncompliance with other medical treatment and regimen due to unspecified reason: Secondary | ICD-10-CM

## 2019-03-17 DIAGNOSIS — I5032 Chronic diastolic (congestive) heart failure: Secondary | ICD-10-CM | POA: Diagnosis present

## 2019-03-17 DIAGNOSIS — I252 Old myocardial infarction: Secondary | ICD-10-CM

## 2019-03-17 DIAGNOSIS — E8889 Other specified metabolic disorders: Secondary | ICD-10-CM | POA: Diagnosis present

## 2019-03-17 DIAGNOSIS — I9589 Other hypotension: Secondary | ICD-10-CM | POA: Diagnosis present

## 2019-03-17 DIAGNOSIS — J9601 Acute respiratory failure with hypoxia: Secondary | ICD-10-CM | POA: Diagnosis not present

## 2019-03-17 DIAGNOSIS — W19XXXA Unspecified fall, initial encounter: Secondary | ICD-10-CM | POA: Insufficient documentation

## 2019-03-17 DIAGNOSIS — S299XXA Unspecified injury of thorax, initial encounter: Secondary | ICD-10-CM | POA: Diagnosis not present

## 2019-03-17 DIAGNOSIS — G919 Hydrocephalus, unspecified: Secondary | ICD-10-CM | POA: Diagnosis not present

## 2019-03-17 DIAGNOSIS — R0689 Other abnormalities of breathing: Secondary | ICD-10-CM | POA: Diagnosis not present

## 2019-03-17 DIAGNOSIS — F17210 Nicotine dependence, cigarettes, uncomplicated: Secondary | ICD-10-CM | POA: Diagnosis present

## 2019-03-17 DIAGNOSIS — J9691 Respiratory failure, unspecified with hypoxia: Secondary | ICD-10-CM | POA: Diagnosis present

## 2019-03-17 DIAGNOSIS — A0472 Enterocolitis due to Clostridium difficile, not specified as recurrent: Secondary | ICD-10-CM | POA: Diagnosis present

## 2019-03-17 DIAGNOSIS — E162 Hypoglycemia, unspecified: Secondary | ICD-10-CM | POA: Diagnosis not present

## 2019-03-17 DIAGNOSIS — G9341 Metabolic encephalopathy: Secondary | ICD-10-CM | POA: Diagnosis not present

## 2019-03-17 DIAGNOSIS — M542 Cervicalgia: Secondary | ICD-10-CM | POA: Diagnosis present

## 2019-03-17 DIAGNOSIS — Z87410 Personal history of cervical dysplasia: Secondary | ICD-10-CM

## 2019-03-17 DIAGNOSIS — R5381 Other malaise: Secondary | ICD-10-CM | POA: Diagnosis not present

## 2019-03-17 DIAGNOSIS — Z8701 Personal history of pneumonia (recurrent): Secondary | ICD-10-CM

## 2019-03-17 DIAGNOSIS — Y9389 Activity, other specified: Secondary | ICD-10-CM | POA: Insufficient documentation

## 2019-03-17 DIAGNOSIS — E785 Hyperlipidemia, unspecified: Secondary | ICD-10-CM | POA: Diagnosis present

## 2019-03-17 DIAGNOSIS — K5790 Diverticulosis of intestine, part unspecified, without perforation or abscess without bleeding: Secondary | ICD-10-CM | POA: Diagnosis present

## 2019-03-17 DIAGNOSIS — R188 Other ascites: Secondary | ICD-10-CM | POA: Diagnosis present

## 2019-03-17 DIAGNOSIS — Z743 Need for continuous supervision: Secondary | ICD-10-CM | POA: Diagnosis not present

## 2019-03-17 DIAGNOSIS — Z9119 Patient's noncompliance with other medical treatment and regimen: Secondary | ICD-10-CM

## 2019-03-17 DIAGNOSIS — I120 Hypertensive chronic kidney disease with stage 5 chronic kidney disease or end stage renal disease: Secondary | ICD-10-CM | POA: Diagnosis not present

## 2019-03-17 DIAGNOSIS — M109 Gout, unspecified: Secondary | ICD-10-CM | POA: Diagnosis present

## 2019-03-17 DIAGNOSIS — Z833 Family history of diabetes mellitus: Secondary | ICD-10-CM

## 2019-03-17 DIAGNOSIS — S0990XA Unspecified injury of head, initial encounter: Secondary | ICD-10-CM | POA: Diagnosis not present

## 2019-03-17 DIAGNOSIS — D696 Thrombocytopenia, unspecified: Secondary | ICD-10-CM | POA: Diagnosis present

## 2019-03-17 DIAGNOSIS — C3491 Malignant neoplasm of unspecified part of right bronchus or lung: Secondary | ICD-10-CM | POA: Diagnosis present

## 2019-03-17 DIAGNOSIS — Y999 Unspecified external cause status: Secondary | ICD-10-CM | POA: Insufficient documentation

## 2019-03-17 DIAGNOSIS — E161 Other hypoglycemia: Secondary | ICD-10-CM | POA: Diagnosis not present

## 2019-03-17 DIAGNOSIS — K219 Gastro-esophageal reflux disease without esophagitis: Secondary | ICD-10-CM | POA: Diagnosis present

## 2019-03-17 DIAGNOSIS — M349 Systemic sclerosis, unspecified: Secondary | ICD-10-CM | POA: Diagnosis present

## 2019-03-17 DIAGNOSIS — I2510 Atherosclerotic heart disease of native coronary artery without angina pectoris: Secondary | ICD-10-CM | POA: Diagnosis present

## 2019-03-17 DIAGNOSIS — F1011 Alcohol abuse, in remission: Secondary | ICD-10-CM | POA: Diagnosis present

## 2019-03-17 DIAGNOSIS — R4189 Other symptoms and signs involving cognitive functions and awareness: Secondary | ICD-10-CM | POA: Diagnosis present

## 2019-03-17 DIAGNOSIS — I491 Atrial premature depolarization: Secondary | ICD-10-CM | POA: Diagnosis not present

## 2019-03-17 DIAGNOSIS — Z515 Encounter for palliative care: Secondary | ICD-10-CM

## 2019-03-17 DIAGNOSIS — Z8249 Family history of ischemic heart disease and other diseases of the circulatory system: Secondary | ICD-10-CM

## 2019-03-17 DIAGNOSIS — Z9981 Dependence on supplemental oxygen: Secondary | ICD-10-CM

## 2019-03-17 DIAGNOSIS — F17200 Nicotine dependence, unspecified, uncomplicated: Secondary | ICD-10-CM | POA: Diagnosis present

## 2019-03-17 DIAGNOSIS — Z79899 Other long term (current) drug therapy: Secondary | ICD-10-CM | POA: Insufficient documentation

## 2019-03-17 DIAGNOSIS — W06XXXA Fall from bed, initial encounter: Secondary | ICD-10-CM | POA: Diagnosis present

## 2019-03-17 DIAGNOSIS — S199XXA Unspecified injury of neck, initial encounter: Secondary | ICD-10-CM | POA: Diagnosis not present

## 2019-03-17 DIAGNOSIS — Z803 Family history of malignant neoplasm of breast: Secondary | ICD-10-CM

## 2019-03-17 DIAGNOSIS — M2550 Pain in unspecified joint: Secondary | ICD-10-CM | POA: Diagnosis not present

## 2019-03-17 LAB — CBC WITH DIFFERENTIAL/PLATELET
Abs Immature Granulocytes: 0.08 10*3/uL — ABNORMAL HIGH (ref 0.00–0.07)
Abs Immature Granulocytes: 0.04 10*3/uL (ref 0.00–0.07)
Basophils Absolute: 0 10*3/uL (ref 0.0–0.1)
Basophils Absolute: 0 10*3/uL (ref 0.0–0.1)
Basophils Relative: 0 %
Basophils Relative: 0 %
Eosinophils Absolute: 0 10*3/uL (ref 0.0–0.5)
Eosinophils Absolute: 0 10*3/uL (ref 0.0–0.5)
Eosinophils Relative: 0 %
Eosinophils Relative: 0 %
HCT: 31 % — ABNORMAL LOW (ref 36.0–46.0)
HCT: 40.3 % (ref 36.0–46.0)
Hemoglobin: 10 g/dL — ABNORMAL LOW (ref 12.0–15.0)
Hemoglobin: 12.8 g/dL (ref 12.0–15.0)
Immature Granulocytes: 1 %
Immature Granulocytes: 1 %
Lymphocytes Relative: 9 %
Lymphocytes Relative: 8 %
Lymphs Abs: 0.9 10*3/uL (ref 0.7–4.0)
Lymphs Abs: 0.6 10*3/uL — ABNORMAL LOW (ref 0.7–4.0)
MCH: 31.1 pg (ref 26.0–34.0)
MCH: 30.5 pg (ref 26.0–34.0)
MCHC: 32.3 g/dL (ref 30.0–36.0)
MCHC: 31.8 g/dL (ref 30.0–36.0)
MCV: 96.3 fL (ref 80.0–100.0)
MCV: 96 fL (ref 80.0–100.0)
Monocytes Absolute: 0.7 10*3/uL (ref 0.1–1.0)
Monocytes Absolute: 0.6 10*3/uL (ref 0.1–1.0)
Monocytes Relative: 7 %
Monocytes Relative: 7 %
Neutro Abs: 7.8 10*3/uL — ABNORMAL HIGH (ref 1.7–7.7)
Neutro Abs: 6.6 10*3/uL (ref 1.7–7.7)
Neutrophils Relative %: 83 %
Neutrophils Relative %: 84 %
Platelets: 183 10*3/uL (ref 150–400)
Platelets: UNDETERMINED 10*3/uL (ref 150–400)
RBC: 3.22 MIL/uL — ABNORMAL LOW (ref 3.87–5.11)
RBC: 4.2 MIL/uL (ref 3.87–5.11)
RDW: 17.2 % — ABNORMAL HIGH (ref 11.5–15.5)
RDW: 17.4 % — ABNORMAL HIGH (ref 11.5–15.5)
WBC: 9.4 10*3/uL (ref 4.0–10.5)
WBC: 7.8 10*3/uL (ref 4.0–10.5)
nRBC: 0 % (ref 0.0–0.2)
nRBC: 0 % (ref 0.0–0.2)

## 2019-03-17 LAB — LACTIC ACID, PLASMA
Lactic Acid, Venous: 4.4 mmol/L (ref 0.5–1.9)
Lactic Acid, Venous: 3 mmol/L (ref 0.5–1.9)
Lactic Acid, Venous: 1.7 mmol/L (ref 0.5–1.9)

## 2019-03-17 LAB — COMPREHENSIVE METABOLIC PANEL
ALT: 13 U/L (ref 0–44)
AST: 18 U/L (ref 15–41)
Albumin: 2.6 g/dL — ABNORMAL LOW (ref 3.5–5.0)
Alkaline Phosphatase: 129 U/L — ABNORMAL HIGH (ref 38–126)
Anion gap: 15 (ref 5–15)
BUN: 23 mg/dL (ref 8–23)
CO2: 28 mmol/L (ref 22–32)
Calcium: 9.1 mg/dL (ref 8.9–10.3)
Chloride: 92 mmol/L — ABNORMAL LOW (ref 98–111)
Creatinine, Ser: 4.94 mg/dL — ABNORMAL HIGH (ref 0.44–1.00)
GFR calc Af Amer: 10 mL/min — ABNORMAL LOW (ref >60)
GFR calc non Af Amer: 8 mL/min — ABNORMAL LOW (ref >60)
Glucose, Bld: 94 mg/dL (ref 70–99)
Potassium: 3.3 mmol/L — ABNORMAL LOW (ref 3.5–5.1)
Sodium: 135 mmol/L (ref 135–145)
Total Bilirubin: 0.8 mg/dL (ref 0.3–1.2)
Total Protein: 7.4 g/dL (ref 6.5–8.1)

## 2019-03-17 LAB — BASIC METABOLIC PANEL
Anion gap: 23 — ABNORMAL HIGH (ref 5–15)
BUN: 43 mg/dL — ABNORMAL HIGH (ref 8–23)
CO2: 20 mmol/L — ABNORMAL LOW (ref 22–32)
Calcium: 9 mg/dL (ref 8.9–10.3)
Chloride: 90 mmol/L — ABNORMAL LOW (ref 98–111)
Creatinine, Ser: 7.18 mg/dL — ABNORMAL HIGH (ref 0.44–1.00)
GFR calc Af Amer: 6 mL/min — ABNORMAL LOW (ref >60)
GFR calc non Af Amer: 5 mL/min — ABNORMAL LOW (ref >60)
Glucose, Bld: 128 mg/dL — ABNORMAL HIGH (ref 70–99)
Potassium: 3.5 mmol/L (ref 3.5–5.1)
Sodium: 133 mmol/L — ABNORMAL LOW (ref 135–145)

## 2019-03-17 LAB — HEPATIC FUNCTION PANEL
ALT: 12 U/L (ref 0–44)
AST: 21 U/L (ref 15–41)
Albumin: 2.7 g/dL — ABNORMAL LOW (ref 3.5–5.0)
Alkaline Phosphatase: 140 U/L — ABNORMAL HIGH (ref 38–126)
Bilirubin, Direct: 0.5 mg/dL — ABNORMAL HIGH (ref 0.0–0.2)
Indirect Bilirubin: 0.9 mg/dL (ref 0.3–0.9)
Total Bilirubin: 1.4 mg/dL — ABNORMAL HIGH (ref 0.3–1.2)
Total Protein: 7.8 g/dL (ref 6.5–8.1)

## 2019-03-17 LAB — CK: Total CK: 40 U/L (ref 38–234)

## 2019-03-17 LAB — LIPASE, BLOOD: Lipase: 17 U/L (ref 11–51)

## 2019-03-17 LAB — POC SARS CORONAVIRUS 2 AG -  ED: SARS Coronavirus 2 Ag: NEGATIVE

## 2019-03-17 LAB — MAGNESIUM: Magnesium: 2 mg/dL (ref 1.7–2.4)

## 2019-03-17 MED ORDER — PROMETHAZINE HCL 25 MG/ML IJ SOLN
12.5000 mg | Freq: Once | INTRAMUSCULAR | Status: AC
  Administered 2019-03-17: 12.5 mg via INTRAVENOUS
  Filled 2019-03-17: qty 1

## 2019-03-17 MED ORDER — ONDANSETRON HCL 4 MG/2ML IJ SOLN
4.0000 mg | Freq: Once | INTRAMUSCULAR | Status: AC
  Administered 2019-03-17: 04:00:00 4 mg via INTRAVENOUS
  Filled 2019-03-17: qty 2

## 2019-03-17 MED ORDER — PROMETHAZINE HCL 25 MG RE SUPP: 25.0000 mg | Freq: Four times a day (QID) | RECTAL | 0 refills | Status: AC | PRN

## 2019-03-17 MED ORDER — IOHEXOL 300 MG/ML  SOLN
80.0000 mL | Freq: Once | INTRAMUSCULAR | Status: AC | PRN
  Administered 2019-03-17: 80 mL via INTRAVENOUS

## 2019-03-17 MED ORDER — PROMETHAZINE HCL 25 MG PO TABS: 25.0000 mg | ORAL_TABLET | Freq: Four times a day (QID) | ORAL | 0 refills | Status: AC | PRN

## 2019-03-17 NOTE — Patient Instructions (Signed)
Social Worker Visit Information  Goals we discussed today:  Goals Addressed            This Visit's Progress   . Collaborate with RN Case Manager to assist with care coordination needs in response to recent ED visit       Current Barriers:  . Recent ED visit x 2 on 03/20/2019 . ESRD ; missed HD sessions x 2 in the last week  Social Work Clinical Goal(s):  Marland Kitchen Over the next 30 days the patient will work with RN Case Manager to develop an individualized plan of care related to ESRD in an effort to reduce ED visits and IP admission.  CCM SW Interventions: . Performed chart review to note patient seen in ED twice on 04/05/2019 o Documentation indicates patient missed HD session last week x 2 . Collaboration with RN Case Manager regarding patient current disposition . Scheduled follow up care coordination call over the next 48 hours to assist with acute needs  Patient Self Care Activities:  . Self administers medications as prescribed . Calls pharmacy for medication refills . Supportive daughter to assist with care needs  Initial goal documentation          Follow Up Plan: SW will follow up with patient by phone over the next 48 hours   Daneen Schick, BSW, CDP Social Worker, Certified Dementia Practitioner [place] [facility] [place] [facility] [phone]

## 2019-03-17 NOTE — Discharge Instructions (Addendum)
It is very important that you go for your dialysis session today, and that you not skip any sessions.

## 2019-03-17 NOTE — ED Notes (Signed)
PIVC placed on the RAC with a 20G which had positive blood return and flushed without pain or infiltration. Blood collected, labeled, and sent to lab.

## 2019-03-17 NOTE — ED Notes (Signed)
Pt resting in bed. Pt denies new or worsening complaints. Will continue to monitor. No distress noted. Pt on continuous monitoring via blood pressure, pulse ox, and cardiac monitor.  

## 2019-03-17 NOTE — ED Notes (Signed)
Pt returned from CT via cart. Pt conscious, breathing, and A&Ox4.

## 2019-03-17 NOTE — Chronic Care Management (AMB) (Signed)
Chronic Care Management    Social Work Follow Up Note  03/24/2019 Name: Jessica Clarke MRN: [id number] DOB: Mar 05, 1950  Jessica Clarke is a 69 y.o. year old female who is a primary care patient of [name], [place]. The CCM team was consulted for assistance with care coordination.   The care management team received notification of patient recent ED visits on 04/09/2019. Chart reviewed to note patient currently active with care management program. Collaboration with RN Case Manager regarding patient disposition.  Outpatient Encounter Medications as of 03/19/2019  Medication Sig  . acetaminophen (TYLENOL) 325 MG tablet Take 325-650 mg by mouth every 8 (eight) hours as needed for mild pain.   Marland Kitchen allopurinol (ZYLOPRIM) 100 MG tablet Take 100 mg by mouth daily.    . Amino Acids-Protein Hydrolys (FEEDING SUPPLEMENT, PRO-STAT SUGAR FREE 64,) LIQD Take 30 mLs by mouth 2 (two) times daily.  Marland Kitchen atorvastatin (LIPITOR) 10 MG tablet Take 10 mg by mouth at bedtime.   . B Complex-Folic Acid (B COMPLEX FORMULA 1) TABS Take 1 tablet by mouth daily.   . calcitRIOL (ROCALTROL) 0.5 MCG capsule Take 2 capsules (1 mcg total) by mouth every Monday, Wednesday, and Friday with hemodialysis.  Marland Kitchen cinacalcet (SENSIPAR) 30 MG tablet Take 1 tablet (30 mg total) by mouth daily with supper.  . colchicine 0.6 MG tablet Take 0.6 mg by mouth daily as needed (as directed for gout flares or pain).   . Darbepoetin Alfa (ARANESP) 60 MCG/0.3ML SOSY injection Inject 0.3 mLs (60 mcg total) into the vein every Friday with hemodialysis.  Marland Kitchen loperamide (IMODIUM) 2 MG capsule Take 2 mg by mouth daily as needed for diarrhea or loose stools.   . midodrine (PROAMATINE) 10 MG tablet Take 1 tablet (10 mg total) by mouth 2 (two) times daily with a meal.  . multivitamin (RENA-VIT) TABS tablet Take 1 tablet by mouth at bedtime.  . prochlorperazine (COMPAZINE) 10 MG tablet Take 1 tablet (10 mg total) by mouth every 6 (six) hours as needed for nausea or  vomiting.  . promethazine (PHENERGAN) 25 MG suppository Place 1 suppository (25 mg total) rectally every 6 (six) hours as needed for nausea or vomiting.  . promethazine (PHENERGAN) 25 MG tablet Take 1 tablet (25 mg total) by mouth every 6 (six) hours as needed for nausea or vomiting.  . thiamine (VITAMIN B-1) 100 MG tablet Take 100 mg by mouth daily.  . VELPHORO 500 MG chewable tablet Chew 1,000 mg by mouth 3 (three) times daily with meals.   . Vitamin D, Cholecalciferol, 25 MCG (1000 UT) TABS Take 1,000 Units by mouth daily.    No facility-administered encounter medications on file as of 04/01/2019.     Goals Addressed            This Visit's Progress   . Collaborate with RN Case Manager to assist with care coordination needs in response to recent ED visit       Current Barriers:  . Recent ED visit x 2 on 04/10/2019 . ESRD ; missed HD sessions x 2 in the last week  Social Work Clinical Goal(s):  Marland Kitchen Over the next 30 days the patient will work with RN Case Manager to develop an individualized plan of care related to ESRD in an effort to reduce ED visits and IP admission.  CCM SW Interventions: . Performed chart review to note patient seen in ED twice on 04/05/2019 o Documentation indicates patient missed HD session last week x 2 . Collaboration with  RN Case Manager regarding patient current disposition . Scheduled follow up care coordination call over the next 48 hours to assist with acute needs  Patient Self Care Activities:  . Self administers medications as prescribed . Calls pharmacy for medication refills . Supportive daughter to assist with care needs  Initial goal documentation         Follow Up Plan: SW will follow up with patient by phone over the next 48 hours.  Daneen Schick, BSW, CDP Social Worker, Certified Dementia Practitioner [place] [facility] [place] [facility] [phone]  Total time spent performing care coordination and/or care management activities with the patient by  phone or face to face = 10 minutes.

## 2019-03-17 NOTE — ED Notes (Signed)
Date and time results received: 04/05/2019 0518  Test: Lactic Acid Critical Value: 4.4  Name of Provider Notified: Roxanne Mins, MD  Orders Received? Or Actions Taken?: none at this time

## 2019-03-17 NOTE — ED Provider Notes (Signed)
[place] [facility] EMERGENCY DEPARTMENT Provider Note   CSN: [id number] Arrival date & time: 03/31/2019  0341   History Chief Complaint  Patient presents with  . Fall  . Altered Mental Status    Jessica Clarke is a 69 y.o. female.  The history is provided by the patient.  Fall  Altered Mental Status She has history of hypertension, hyperlipidemia, end-stage renal disease on hemodialysis, heart failure, lung cancer and comes in by ambulance following an unwitnessed fall.  Apparently, she had missed her last 2 dialysis sessions, so last dialysis session was 7 days ago.  EMS thought that she may have been postictal when they arrived.  Patient is complaining of nausea and is vomited several times.  She is also complaining of neck pain.  Otherwise, she is a very poor historian and will not tell us why she is missed dialysis other than she was not feeling well.  Past Medical History:  Diagnosis Date  . Abnormal Pap smear    ASCUS ?HGSIL  . Anemia   . Arthritis   . Cancer ([place])    lung ( per daughter)  . Cervical dysplasia   . CHF (congestive heart failure) ([place])   . Chronic kidney disease   . Constipation   . Constipation   . Dyspnea   . GERD (gastroesophageal reflux disease)   . Gout   . Hyperlipidemia   . Hypertension   . Myocardial infarction ([place]) 2015  . Obesity   . Pneumonia   . Uterine polyp     Patient Active Problem List   Diagnosis Date Noted  . Encounter for antineoplastic chemotherapy 03/11/2019  . Acute pulmonary edema ([place]) 02/05/2019  . Enteritis 02/05/2019  . Acute respiratory failure with hypoxia ([place]) 02/05/2019  . Adenocarcinoma of right lung, stage 3 ([place]) 12/26/2018  . Goals of care, counseling/discussion 12/26/2018  . Mediastinal adenopathy 12/19/2018  . Lung mass 11/05/2018  . Polyp of colon   . Diarrhea 10/18/2018  . Rectal bleeding 10/18/2018  . Symptomatic anemia 10/17/2018  . Lung nodule < 6cm on CT 03/05/2018  . Malnutrition of  moderate degree 08/28/2016  . Respiratory distress 08/26/2016  . ESRD (end stage renal disease) on dialysis ([place]) 06/08/2015  . CAD- non obstructive RI disease 08/26/13 08/27/2013  . NSTEMI-08/25/13- non obstructive CAD at cath 08/26/13 08/26/2013  . Chronic combined systolic and diastolic heart failure ([place]) 08/26/2013  . Alcohol abuse 08/25/2013  . Respiratory failure with hypoxia and hypercapnia ([place]) 08/25/2013  . Flash pulmonary edema ([place]) 08/25/2013  . Encounter for adequacy testing for hemodialysis ([place]) 06/26/2012  . End stage renal disease ([place]) 12/27/2011  . ASCUS (atypical squamous cells of undetermined significance) on Pap smear 09/09/2010  . Hypertension 09/09/2010  . Cervical polyp 09/09/2010  . Smoker 09/09/2010  . Post-menopausal bleeding 09/09/2010  . Gout 09/09/2010    Past Surgical History:  Procedure Laterality Date  . AV FISTULA PLACEMENT  01/18/2012   Procedure: ARTERIOVENOUS (AV) FISTULA CREATION;  Surgeon: Angelia Mould, MD;  Location: [facility];  Service: Vascular;  Laterality: Left;  . BIOPSY  10/20/2018   Procedure: BIOPSY;  Surgeon: Thornton Park, MD;  Location: [facility];  Service: Gastroenterology;;  . COLONOSCOPY WITH PROPOFOL N/A 10/20/2018   Procedure: COLONOSCOPY WITH PROPOFOL;  Surgeon: Thornton Park, MD;  Location: [facility] [place];  Service: Gastroenterology;  Laterality: N/A;  . DILATION AND CURETTAGE OF UTERUS    . HYSTEROSCOPY    . LEEP    . LEFT HEART CATHETERIZATION  WITH CORONARY ANGIOGRAM N/A 08/26/2013   Procedure: LEFT HEART CATHETERIZATION WITH CORONARY ANGIOGRAM;  Surgeon: Leonie Man, MD;  Location: [facility] CATH LAB;  Service: Cardiovascular;  Laterality: N/A;  . POLYPECTOMY  10/20/2018   Procedure: POLYPECTOMY;  Surgeon: Thornton Park, MD;  Location: [facility] ENDOSCOPY;  Service: Gastroenterology;;  . REVISON OF ARTERIOVENOUS FISTULA Left 09/26/4816   Procedure: PLICATION OF ARTERIOVENOUS FISTULA;  Surgeon: Conrad Lavallette, MD;  Location:  [place];  Service: Vascular;  Laterality: Left;  Marland Kitchen VIDEO BRONCHOSCOPY WITH ENDOBRONCHIAL ULTRASOUND N/A 12/19/2018   Procedure: VIDEO BRONCHOSCOPY WITH ENDOBRONCHIAL ULTRASOUND;  Surgeon: Garner Nash, DO;  Location: [facility] OR;  Service: Thoracic;  Laterality: N/A;     OB History    Gravida  2   Para  1   Term      Preterm      AB  1   Living  1     SAB      TAB  1   Ectopic      Multiple      Live Births              Family History  Problem Relation Age of Onset  . Esophageal cancer Other   . Pancreatic cancer Other   . Heart disease Mother   . Hypertension Mother   . Breast cancer Sister   . Esophageal cancer Sister   . Cancer Sister   . Deep vein thrombosis Sister   . Diabetes Sister   . Hypertension Sister   . Hypertension Daughter   . Colon cancer Neg Hx     Social History   Tobacco Use  . Smoking status: Current Every Day Smoker    Packs/day: 0.25    Years: 30.00    Pack years: 7.50    Types: Cigarettes  . Smokeless tobacco: Never Used  . Tobacco comment: smoking cessation  Substance Use Topics  . Alcohol use: Not Currently  . Drug use: No    Home Medications Prior to Admission medications   Medication Sig Start Date End Date Taking? Authorizing Provider  acetaminophen (TYLENOL) 325 MG tablet Take 325-650 mg by mouth every 8 (eight) hours as needed for mild pain.     [provider]  allopurinol (ZYLOPRIM) 100 MG tablet Take 100 mg by mouth daily.      [provider]  Amino Acids-Protein Hydrolys (FEEDING SUPPLEMENT, PRO-STAT SUGAR FREE 64,) LIQD Take 30 mLs by mouth 2 (two) times daily. 10/26/18   Hosie Poisson, MD  atorvastatin (LIPITOR) 10 MG tablet Take 10 mg by mouth at bedtime.  01/30/16   [provider]  B Complex-Folic Acid (B COMPLEX FORMULA 1) TABS Take 1 tablet by mouth daily.  07/11/17   [provider]  calcitRIOL (ROCALTROL) 0.5 MCG capsule Take 2 capsules (1 mcg total) by mouth every Monday,  Wednesday, and Friday with hemodialysis. 10/28/18   Hosie Poisson, MD  cinacalcet (SENSIPAR) 30 MG tablet Take 1 tablet (30 mg total) by mouth daily with supper. 10/26/18   Hosie Poisson, MD  colchicine 0.6 MG tablet Take 0.6 mg by mouth daily as needed (as directed for gout flares or pain).  08/14/18   [provider]  Darbepoetin Alfa (ARANESP) 60 MCG/0.3ML SOSY injection Inject 0.3 mLs (60 mcg total) into the vein every Friday with hemodialysis. 11/01/18   Hosie Poisson, MD  loperamide (IMODIUM) 2 MG capsule Take 2 mg by mouth daily as needed for diarrhea or loose stools.  06/24/18 06/23/19  [provider]  midodrine (PROAMATINE) 10 MG tablet Take 1 tablet (10 mg total) by mouth 2 (two) times daily with a meal. 10/26/18   Hosie Poisson, MD  multivitamin (RENA-VIT) TABS tablet Take 1 tablet by mouth at bedtime. 10/26/18   Hosie Poisson, MD  ondansetron (ZOFRAN ODT) 4 MG disintegrating tablet 4mg  ODT q4 hours prn nausea/vomit 02/05/19   Drenda Freeze, MD  prochlorperazine (COMPAZINE) 10 MG tablet Take 1 tablet (10 mg total) by mouth every 6 (six) hours as needed for nausea or vomiting. 02/06/19   Heilingoetter, Cassandra L, PA-C  thiamine (VITAMIN B-1) 100 MG tablet Take 100 mg by mouth daily.    [provider]  VELPHORO 500 MG chewable tablet Chew 1,000 mg by mouth 3 (three) times daily with meals.  06/30/16   [provider]  Vitamin D, Cholecalciferol, 25 MCG (1000 UT) TABS Take 1,000 Units by mouth daily.  08/12/18 08/11/19  [provider]    Allergies    Patient has no known allergies.  Review of Systems   Review of Systems  All other systems reviewed and are negative.   Physical Exam Updated Vital Signs BP (!) 126/110   Pulse 78   Temp 98.3 F (36.8 C) (Oral)   Resp 14   SpO2 93%   Physical Exam Vitals and nursing note reviewed.   Somewhat emaciated 69 year old female, resting comfortably and in no acute distress. Vital signs are  significant for elevated blood pressure.  Reported respiratory rate is normal but she seems tachypneic on exam. Oxygen saturation is 97%, which is normal. Head is normocephalic and atraumatic. PERRLA, EOMI. Oropharynx is clear. Neck is nontender and supple without adenopathy or JVD. Back is nontender and there is no CVA tenderness. Lungs have decreased airflow with mild expiratory wheezing diffusely.  There are no rales or rhonchi. Chest is nontender. Heart has regular rate and rhythm without murmur. Abdomen is soft, flat, nontender without masses or hepatosplenomegaly and peristalsis is normoactive. Extremities have no cyanosis or edema, full range of motion is present.  AV fistula is present in the left upper arm with thrill present. Skin is warm and dry without rash. Neurologic: Awake and clearly oriented to person and place.  Difficult to assess orientation to time as she will not answer questions, cranial nerves are grossly intact, there are no gross motor or sensory deficits.  ED Results / Procedures / Treatments   Labs (all labs ordered are listed, but only abnormal results are displayed) Labs Reviewed  BASIC METABOLIC PANEL - Abnormal; Notable for the following components:      Result Value   Sodium 133 (*)    Chloride 90 (*)    CO2 20 (*)    Glucose, Bld 128 (*)    BUN 43 (*)    Creatinine, Ser 7.18 (*)    GFR calc non Af Amer 5 (*)    GFR calc Af Amer 6 (*)    Anion gap 23 (*)    All other components within normal limits  CBC WITH DIFFERENTIAL/PLATELET - Abnormal; Notable for the following components:   RBC 3.22 (*)    Hemoglobin 10.0 (*)    HCT 31.0 (*)    RDW 17.2 (*)    Neutro Abs 7.8 (*)    Abs Immature Granulocytes 0.08 (*)    All other components within normal limits  LACTIC ACID, PLASMA - Abnormal; Notable for the following components:   Lactic Acid,  Venous 4.4 (*)    All other components within normal limits  HEPATIC FUNCTION PANEL - Abnormal; Notable for the  following components:   Albumin 2.7 (*)    Alkaline Phosphatase 140 (*)    Total Bilirubin 1.4 (*)    Bilirubin, Direct 0.5 (*)    All other components within normal limits  MAGNESIUM  CK  LACTIC ACID, PLASMA    EKG EKG Interpretation  Date/Time:  Monday March 17 2019 03:47:21 EST Ventricular Rate:  82 PR Interval:    QRS Duration: 125 QT Interval:  437 QTC Calculation: 511 R Axis:   -56 Text Interpretation: Sinus rhythm Prolonged PR interval Nonspecific IVCD with LAD Left ventricular hypertrophy Abnormal T, consider ischemia, lateral leads When compared with ECG of 02/05/2019, T wave inversion Anterolateral leads is slightly more pronounced Confirmed by Delora Fuel ([id number]) on 04/05/2019 3:55:14 AM   Radiology DG Chest 1 View  Result Date: 03/27/2019 CLINICAL DATA:  Initial evaluation for acute altered mental status, unwitnessed fall. EXAM: CHEST  1 VIEW COMPARISON:  Prior radiograph from 02/05/2019. FINDINGS: Moderate cardiomegaly, stable. Mediastinal silhouette within normal limits. Aortic atherosclerosis. Lungs hypoinflated. Diffuse pulmonary vascular congestion with interstitial prominence, compatible with moderate pulmonary interstitial edema. No definite pleural effusion. Superimposed infection would be difficult to exclude, and could be considered in the correct clinical setting. No pneumothorax. No acute osseous finding. IMPRESSION: 1. Cardiomegaly with moderate diffuse pulmonary interstitial edema. Superimposed infection would be difficult to exclude, and could be considered in the correct clinical setting. 2.  Aortic Atherosclerosis (ICD10-I70.0). Electronically Signed   By: Jeannine Boga M.D.   On: 04/01/2019 04:43   CT Head Wo Contrast  Result Date: 04/11/2019 CLINICAL DATA:  Unwitnessed fall with head trauma EXAM: CT HEAD WITHOUT CONTRAST CT CERVICAL SPINE WITHOUT CONTRAST TECHNIQUE: Multidetector CT imaging of the head and cervical spine was performed following the  standard protocol without intravenous contrast. Multiplanar CT image reconstructions of the cervical spine were also generated. COMPARISON:  None. FINDINGS: CT HEAD FINDINGS Brain: No evidence of acute infarction, hemorrhage, hydrocephalus, extra-axial collection or mass lesion/mass effect. Small remote appearing right cerebellar infarct. Age congruent cerebral volume loss. Vascular: No hyperdense vessel or unexpected calcification. Skull: Normal. Negative for fracture or focal lesion. Sinuses/Orbits: No evidence of injury. CT CERVICAL SPINE FINDINGS Alignment: No traumatic malalignment. There is C4-5 anterolisthesis associated with advanced facet arthropathy. Skull base and vertebrae: Negative for acute fracture or aggressive bone lesion. There is limitation by motion artifact Soft tissues and spinal canal: No prevertebral fluid or swelling. No visible canal hematoma. Disc levels: C4-5 and C5-6 disc narrowing and endplate degeneration. Facet osteoarthritis most notable on the left at C4-5 where there is calcified intra and periarticular mass, likely gouty. The associated facet is eroded. This calcified mass encroaches on the left foramen. Upper chest: Apical emphysematous changes Other: Scanogram there is cardiomegaly and interstitial coarsening. Chest x-ray is pending. Motion artifact, but overall diagnostic. IMPRESSION: 1. No evidence of acute intracranial or cervical spine injury. 2. Calcified articular and periarticular mass on the left at C4-5 with erosive arthropathy, a gouty appearance. Electronically Signed   By: Monte Fantasia M.D.   On: 04/02/2019 04:45   CT Cervical Spine Wo Contrast  Result Date: 03/23/2019 CLINICAL DATA:  Unwitnessed fall with head trauma EXAM: CT HEAD WITHOUT CONTRAST CT CERVICAL SPINE WITHOUT CONTRAST TECHNIQUE: Multidetector CT imaging of the head and cervical spine was performed following the standard protocol without intravenous contrast. Multiplanar CT image reconstructions  of  the cervical spine were also generated. COMPARISON:  None. FINDINGS: CT HEAD FINDINGS Brain: No evidence of acute infarction, hemorrhage, hydrocephalus, extra-axial collection or mass lesion/mass effect. Small remote appearing right cerebellar infarct. Age congruent cerebral volume loss. Vascular: No hyperdense vessel or unexpected calcification. Skull: Normal. Negative for fracture or focal lesion. Sinuses/Orbits: No evidence of injury. CT CERVICAL SPINE FINDINGS Alignment: No traumatic malalignment. There is C4-5 anterolisthesis associated with advanced facet arthropathy. Skull base and vertebrae: Negative for acute fracture or aggressive bone lesion. There is limitation by motion artifact Soft tissues and spinal canal: No prevertebral fluid or swelling. No visible canal hematoma. Disc levels: C4-5 and C5-6 disc narrowing and endplate degeneration. Facet osteoarthritis most notable on the left at C4-5 where there is calcified intra and periarticular mass, likely gouty. The associated facet is eroded. This calcified mass encroaches on the left foramen. Upper chest: Apical emphysematous changes Other: Scanogram there is cardiomegaly and interstitial coarsening. Chest x-ray is pending. Motion artifact, but overall diagnostic. IMPRESSION: 1. No evidence of acute intracranial or cervical spine injury. 2. Calcified articular and periarticular mass on the left at C4-5 with erosive arthropathy, a gouty appearance. Electronically Signed   By: Monte Fantasia M.D.   On: 03/30/2019 04:45    Procedures Procedures  Medications Ordered in ED Medications  ondansetron (ZOFRAN) injection 4 mg (4 mg Intravenous Given 04/13/2019 0406)  promethazine (PHENERGAN) injection 12.5 mg (12.5 mg Intravenous Given 04/04/2019 2229)    ED Course  I have reviewed the triage vital signs and the nursing notes.  Pertinent labs & imaging results that were available during my care of the patient were reviewed by me and considered in my  medical decision making (see chart for details).  MDM Rules/Calculators/A&P Unwitnessed fall with complaints of neck pain.  Will send for CT of head and cervical spine.  Possible altered mental status, consider seizure with postictal state.  Will check screening labs and add CK and lactic acid which might be elevated if she had a recent seizure.  End-stage renal disease with noncompliance for dialysis, last dialysis session 6 days ago.  Will need to check for hyperkalemia.  No ECG findings suggestive of hyperkalemia.  Old records are reviewed, and she was diagnosed with lung cancer within the past few months and has just started chemotherapy and radiation therapy.  MRI of the brain September 10 showed no evidence of metastatic disease.  CT scans show no evidence of acute injury.  Chest x-ray is consistent with volume overload, but patient is maintaining adequate oxygen saturation on room air.  Potassium is normal.  No indication for emergent dialysis.  She did continue to have nausea and is given additional promethazine.  She is felt to be safe for discharge.  Her dialysis is scheduled for later this morning.  Importance of going to her dialysis sessions with stressed.  She is given prescription for promethazine tablets and suppositories.  Final Clinical Impression(s) / ED Diagnoses Final diagnoses:  Fall, initial encounter  Non-intractable vomiting with nausea, unspecified vomiting type  End-stage renal disease on hemodialysis ([place])  Anemia associated with chronic renal failure  Noncompliance with treatment regimen    Rx / DC Orders ED Discharge Orders         Ordered    promethazine (PHENERGAN) 25 MG tablet  Every 6 hours PRN     04/06/2019 0643    promethazine (PHENERGAN) 25 MG suppository  Every 6 hours PRN     04/06/2019 7989  Delora Fuel, MD 91/55/02 [phone]

## 2019-03-17 NOTE — ED Notes (Signed)
Pt is complaining of Neck Pain and is Vomiting

## 2019-03-17 NOTE — ED Notes (Signed)
[facility] at bedside to transport patient home. Per previous shift. Pt is to go home via [facility] so she can be home for transport to dialysis at 1000 today.

## 2019-03-17 NOTE — ED Notes (Signed)
Pt to CT via cart. No distress noted.

## 2019-03-17 NOTE — ED Triage Notes (Signed)
Pt BIB [facility] from Dialysis Facility with Headache and malaise. Had fall (mechanical fall in which she states she slid out of bed) yesterday for which she was seen here for and discharged.   Dialysis pt with L. Arm restricted, nearly finished treatment today with 10-15 minutes left of dialysis.    EMS VS: 110/72 BP 92 HR CBG 74

## 2019-03-17 NOTE — ED Notes (Signed)
Patient transported to CT 

## 2019-03-17 NOTE — ED Triage Notes (Signed)
Pt came in [place] post unwitnessed fall, she is experiencing AMS. She is also a dialysis patientt MWF who missed last Wednesday and Friday. EMS reports patient may have been in a postictal state upon arrival.  [id number].

## 2019-03-18 ENCOUNTER — Ambulatory Visit: Payer: Medicare Other

## 2019-03-18 ENCOUNTER — Inpatient Hospital Stay: Payer: Medicare Other

## 2019-03-18 ENCOUNTER — Inpatient Hospital Stay: Payer: Medicare Other | Admitting: Physician Assistant

## 2019-03-18 ENCOUNTER — Inpatient Hospital Stay (HOSPITAL_COMMUNITY)
Admit: 2019-03-18 | Discharge: 2019-03-18 | Disposition: A | Discharge status: Home / Self Care | Payer: Medicare Other | Attending: Family Medicine | Admitting: Family Medicine

## 2019-03-18 ENCOUNTER — Ambulatory Visit: Payer: Self-pay

## 2019-03-18 DIAGNOSIS — E43 Unspecified severe protein-calorie malnutrition: Secondary | ICD-10-CM | POA: Diagnosis present

## 2019-03-18 DIAGNOSIS — N186 End stage renal disease: Secondary | ICD-10-CM

## 2019-03-18 DIAGNOSIS — Z515 Encounter for palliative care: Secondary | ICD-10-CM | POA: Diagnosis not present

## 2019-03-18 DIAGNOSIS — G919 Hydrocephalus, unspecified: Secondary | ICD-10-CM | POA: Diagnosis not present

## 2019-03-18 DIAGNOSIS — Z681 Body mass index (BMI) 19 or less, adult: Secondary | ICD-10-CM | POA: Diagnosis not present

## 2019-03-18 DIAGNOSIS — Z9981 Dependence on supplemental oxygen: Secondary | ICD-10-CM | POA: Diagnosis not present

## 2019-03-18 DIAGNOSIS — I2510 Atherosclerotic heart disease of native coronary artery without angina pectoris: Secondary | ICD-10-CM

## 2019-03-18 DIAGNOSIS — R4182 Altered mental status, unspecified: Secondary | ICD-10-CM | POA: Diagnosis present

## 2019-03-18 DIAGNOSIS — F17210 Nicotine dependence, cigarettes, uncomplicated: Secondary | ICD-10-CM | POA: Diagnosis present

## 2019-03-18 DIAGNOSIS — Z992 Dependence on renal dialysis: Secondary | ICD-10-CM | POA: Diagnosis not present

## 2019-03-18 DIAGNOSIS — E8770 Fluid overload, unspecified: Secondary | ICD-10-CM | POA: Diagnosis present

## 2019-03-18 DIAGNOSIS — N2581 Secondary hyperparathyroidism of renal origin: Secondary | ICD-10-CM | POA: Diagnosis present

## 2019-03-18 DIAGNOSIS — I5032 Chronic diastolic (congestive) heart failure: Secondary | ICD-10-CM | POA: Diagnosis present

## 2019-03-18 DIAGNOSIS — I120 Hypertensive chronic kidney disease with stage 5 chronic kidney disease or end stage renal disease: Secondary | ICD-10-CM | POA: Diagnosis not present

## 2019-03-18 DIAGNOSIS — A0472 Enterocolitis due to Clostridium difficile, not specified as recurrent: Secondary | ICD-10-CM | POA: Diagnosis present

## 2019-03-18 DIAGNOSIS — D696 Thrombocytopenia, unspecified: Secondary | ICD-10-CM

## 2019-03-18 DIAGNOSIS — D631 Anemia in chronic kidney disease: Secondary | ICD-10-CM | POA: Diagnosis not present

## 2019-03-18 DIAGNOSIS — C3491 Malignant neoplasm of unspecified part of right bronchus or lung: Secondary | ICD-10-CM

## 2019-03-18 DIAGNOSIS — N250 Renal osteodystrophy: Secondary | ICD-10-CM | POA: Diagnosis not present

## 2019-03-18 DIAGNOSIS — M1990 Unspecified osteoarthritis, unspecified site: Secondary | ICD-10-CM | POA: Diagnosis present

## 2019-03-18 DIAGNOSIS — R410 Disorientation, unspecified: Secondary | ICD-10-CM

## 2019-03-18 DIAGNOSIS — W06XXXA Fall from bed, initial encounter: Secondary | ICD-10-CM | POA: Diagnosis present

## 2019-03-18 DIAGNOSIS — R0602 Shortness of breath: Secondary | ICD-10-CM | POA: Diagnosis not present

## 2019-03-18 DIAGNOSIS — Z20822 Contact with and (suspected) exposure to covid-19: Secondary | ICD-10-CM | POA: Diagnosis present

## 2019-03-18 DIAGNOSIS — Z7189 Other specified counseling: Secondary | ICD-10-CM

## 2019-03-18 DIAGNOSIS — Z66 Do not resuscitate: Secondary | ICD-10-CM | POA: Diagnosis present

## 2019-03-18 DIAGNOSIS — J9601 Acute respiratory failure with hypoxia: Secondary | ICD-10-CM | POA: Diagnosis present

## 2019-03-18 DIAGNOSIS — J810 Acute pulmonary edema: Secondary | ICD-10-CM | POA: Diagnosis present

## 2019-03-18 DIAGNOSIS — I2583 Coronary atherosclerosis due to lipid rich plaque: Secondary | ICD-10-CM | POA: Diagnosis not present

## 2019-03-18 DIAGNOSIS — G039 Meningitis, unspecified: Secondary | ICD-10-CM | POA: Diagnosis not present

## 2019-03-18 DIAGNOSIS — I132 Hypertensive heart and chronic kidney disease with heart failure and with stage 5 chronic kidney disease, or end stage renal disease: Secondary | ICD-10-CM | POA: Diagnosis present

## 2019-03-18 DIAGNOSIS — I252 Old myocardial infarction: Secondary | ICD-10-CM | POA: Diagnosis not present

## 2019-03-18 DIAGNOSIS — R188 Other ascites: Secondary | ICD-10-CM | POA: Diagnosis present

## 2019-03-18 DIAGNOSIS — G9341 Metabolic encephalopathy: Secondary | ICD-10-CM | POA: Diagnosis present

## 2019-03-18 LAB — BASIC METABOLIC PANEL
Anion gap: 17 — ABNORMAL HIGH (ref 5–15)
BUN: 29 mg/dL — ABNORMAL HIGH (ref 8–23)
CO2: 24 mmol/L (ref 22–32)
Calcium: 8.9 mg/dL (ref 8.9–10.3)
Chloride: 93 mmol/L — ABNORMAL LOW (ref 98–111)
Creatinine, Ser: 5.33 mg/dL — ABNORMAL HIGH (ref 0.44–1.00)
GFR calc Af Amer: 9 mL/min — ABNORMAL LOW (ref >60)
GFR calc non Af Amer: 8 mL/min — ABNORMAL LOW (ref >60)
Glucose, Bld: 81 mg/dL (ref 70–99)
Potassium: 4.2 mmol/L (ref 3.5–5.1)
Sodium: 134 mmol/L — ABNORMAL LOW (ref 135–145)

## 2019-03-18 LAB — AMMONIA: Ammonia: 52 umol/L — ABNORMAL HIGH (ref 9–35)

## 2019-03-18 LAB — CBC
HCT: 30.2 % — ABNORMAL LOW (ref 36.0–46.0)
Hemoglobin: 9.7 g/dL — ABNORMAL LOW (ref 12.0–15.0)
MCH: 30.7 pg (ref 26.0–34.0)
MCHC: 32.1 g/dL (ref 30.0–36.0)
MCV: 95.6 fL (ref 80.0–100.0)
Platelets: 156 10*3/uL (ref 150–400)
RBC: 3.16 MIL/uL — ABNORMAL LOW (ref 3.87–5.11)
RDW: 17.6 % — ABNORMAL HIGH (ref 11.5–15.5)
WBC: 9.3 10*3/uL (ref 4.0–10.5)
nRBC: 0 % (ref 0.0–0.2)

## 2019-03-18 LAB — BRAIN NATRIURETIC PEPTIDE: B Natriuretic Peptide: >4500 pg/mL — ABNORMAL HIGH (ref 0.0–100.0)

## 2019-03-18 LAB — GLUCOSE, CAPILLARY
Glucose-Capillary: 71 mg/dL (ref 70–99)
Glucose-Capillary: 104 mg/dL — ABNORMAL HIGH (ref 70–99)

## 2019-03-18 MED ORDER — MIDODRINE HCL 5 MG PO TABS
5.0000 mg | ORAL_TABLET | Freq: Two times a day (BID) | ORAL | Status: DC
  Administered 2019-03-18 – 2019-03-20 (×5): 5 mg via ORAL
  Filled 2019-03-18 (×5): qty 1

## 2019-03-18 MED ORDER — CALCITRIOL 0.5 MCG PO CAPS
2.0000 ug | ORAL_CAPSULE | ORAL | Status: DC
  Administered 2019-03-19: 2 ug via ORAL
  Filled 2019-03-18: qty 4

## 2019-03-18 MED ORDER — ENOXAPARIN SODIUM 40 MG/0.4ML ~~LOC~~ SOLN
40.0000 mg | Freq: Every day | SUBCUTANEOUS | Status: DC
  Administered 2019-03-18: 40 mg via SUBCUTANEOUS
  Filled 2019-03-18: qty 0.4

## 2019-03-18 MED ORDER — CINACALCET HCL 30 MG PO TABS
30.0000 mg | ORAL_TABLET | Freq: Every day | ORAL | Status: DC
  Administered 2019-03-19 – 2019-03-20 (×2): 30 mg via ORAL
  Filled 2019-03-18 (×2): qty 1

## 2019-03-18 MED ORDER — ATORVASTATIN CALCIUM 10 MG PO TABS
10.0000 mg | ORAL_TABLET | Freq: Every day | ORAL | Status: DC
  Administered 2019-03-18 – 2019-03-20 (×3): 10 mg via ORAL
  Filled 2019-03-18 (×2): qty 1

## 2019-03-18 MED ORDER — ALLOPURINOL 100 MG PO TABS
100.0000 mg | ORAL_TABLET | Freq: Every day | ORAL | Status: DC
  Administered 2019-03-18 – 2019-03-20 (×3): 100 mg via ORAL
  Filled 2019-03-18 (×3): qty 1

## 2019-03-18 MED ORDER — THIAMINE HCL 100 MG PO TABS
100.0000 mg | ORAL_TABLET | Freq: Every day | ORAL | Status: DC
  Administered 2019-03-18 – 2019-03-20 (×3): 100 mg via ORAL
  Filled 2019-03-18 (×3): qty 1

## 2019-03-18 MED ORDER — ENOXAPARIN SODIUM 30 MG/0.3ML ~~LOC~~ SOLN
30.0000 mg | Freq: Every day | SUBCUTANEOUS | Status: DC
  Administered 2019-03-19 – 2019-03-20 (×2): 30 mg via SUBCUTANEOUS
  Filled 2019-03-18 (×2): qty 0.3

## 2019-03-18 NOTE — Progress Notes (Addendum)
PROGRESS NOTE    Taleeya Blondin  YSA:[id number] DOB: 1950/11/19 DOA: 04/10/2019 PCP: [name], FNP  Brief Narrative:Marchella Diver is a 69 y.o. female with medical history significant of CAD s/p NSTEMI, combined CHF, HTN, stage IIIb/IV adenocarcinoma of right lung, originally diagnosed in 01/2018, lost to follow-up until 10/2018 , has missed several follow-up appointments with oncology and XRT , has not started chemo or radiation yet  -Was seen in the emergency room 2 days ago following a fall and 1-2 episodes of vomiting, she was discharged back home after negative work-up on Phenergan and Compazine  -She was sent to the emergency room yesterday after dialysis secondary to some mental status changes, disorientation  she was also noted to be hypoxemic, upon arrival in the emergency room she was mostly nonverbal and unable to provide any history, per family she been missing dialysis due to feeling unwell, daughter reports she has been sick most of the time, unable to keep her chemo or radiation appointments   Assessment & Plan:  Encephalopathy -I suspect missed dialysis and polypharmacy likely contributing, she was just prescribed Phenergan and Compazine following her recent emergency room visit -CT head without acute findings, MRI brain in 10/2018 was negative for metastasis -Mental status has improved from last night first staff nurses, she is awake alert, oriented to self and place this morning, she was able to tell me the year and month -Follow-up EEG -Stopped sedating medications -PT OT  Stage IIIb/IV adenocarcinoma of right lung -This was originally diagnosed in 01/2018, subsequently lost to follow-up, followed up in September, since then has seen Dr. Earlie Server and Dr. Sondra Come, was set up to receive concurrent chemo radiation, has not been able to follow-up for this yet, underwent simulation recently -Lymph nodes were positive for malignancy in November  Volume overload -Mild hypoxemia  with evidence of fluid overload on x-ray -Status post dialysis yesterday -Suspect she needs dry weight lowered with ongoing weight loss  ESRD on hemodialysis -Last HD yesterday -Poor compliance with dialysis, according to daughter misses at least one dialysis treatment week  Severe protein calorie malnutrition -Supplements as tolerated  Anemia of chronic disease -Baseline hemoglobin around 10, this is stable   Chronic diarrhea -? Malabsorption, cdiff ordered by admitting MD will FU  DVT prophylaxis: Lovenox Code Status: Full code Family Communication: No family at bedside, called and updated daughter Disposition Plan: Home possibly tomorrow pending improvement in encephalopathy  Consultants:   Renal  Palliative   Procedures:   Antimicrobials:    Subjective: -No events overnight, mental status has improved per staff, she is eating breakfast this morning, complains of being thirsty, irritated by EEG techs  Objective: Vitals:   03/18/19 0158 03/18/19 0200 03/18/19 0207 03/18/19 0900  BP:  130/71  120/74  Pulse:    65  Resp:  (!) 21  18  Temp: 98.8 F (37.1 C)   98.7 F (37.1 C)  TempSrc: Oral   Oral  SpO2:   100% 98%    Intake/Output Summary (Last 24 hours) at 03/18/2019 1158 Last data filed at 03/18/2019 0900 Gross per 24 hour  Intake 300 ml  Output 0 ml  Net 300 ml   There were no vitals filed for this visit.  Examination:  General exam: Chronically ill elderly female, cachectic, sitting up in bed, awake alert oriented to self, place and partly to time, poor attention Respiratory system: Few basilar rales Cardiovascular system: S1 & S2 heard, RRR. Gastrointestinal system: Abdomen is nondistended, soft and nontender.Normal  bowel sounds heard. Central nervous system: Alert and oriented x2-3. No focal neurological deficits. Extremities: No edema Skin: No rashes Psychiatry: Mood & affect appropriate.     Data Reviewed:   CBC: Recent Labs  Lab  04/03/2019 0402 04/05/2019 1922 03/18/19 0401  WBC 9.4 7.8 9.3  NEUTROABS 7.8* 6.6  --   HGB 10.0* 12.8 9.7*  HCT 31.0* 40.3 30.2*  MCV 96.3 96.0 95.6  PLT 183 PLATELET CLUMPS NOTED ON SMEAR, UNABLE TO ESTIMATE 948   Basic Metabolic Panel: Recent Labs  Lab 03/24/2019 0402 04/11/2019 1922 03/18/19 0401  NA 133* 135 134*  K 3.5 3.3* 4.2  CL 90* 92* 93*  CO2 20* 28 24  GLUCOSE 128* 94 81  BUN 43* 23 29*  CREATININE 7.18* 4.94* 5.33*  CALCIUM 9.0 9.1 8.9  MG 2.0  --   --    GFR: Estimated Creatinine Clearance: 10.4 mL/min (A) (by C-G formula based on SCr of 5.33 mg/dL (H)). Liver Function Tests: Recent Labs  Lab 03/18/2019 0402 04/07/2019 1922  AST 21 18  ALT 12 13  ALKPHOS 140* 129*  BILITOT 1.4* 0.8  PROT 7.8 7.4  ALBUMIN 2.7* 2.6*   Recent Labs  Lab 03/27/2019 1922  LIPASE 17   Recent Labs  Lab 03/18/19 0401  AMMONIA 52*   Coagulation Profile: No results for input(s): INR, PROTIME in the last 168 hours. Cardiac Enzymes: Recent Labs  Lab 04/08/2019 0402  CKTOTAL 40   BNP (last 3 results) No results for input(s): PROBNP in the last 8760 hours. HbA1C: No results for input(s): HGBA1C in the last 72 hours. CBG: Recent Labs  Lab 03/18/19 0652  GLUCAP 71   Lipid Profile: No results for input(s): CHOL, HDL, LDLCALC, TRIG, CHOLHDL, LDLDIRECT in the last 72 hours. Thyroid Function Tests: No results for input(s): TSH, T4TOTAL, FREET4, T3FREE, THYROIDAB in the last 72 hours. Anemia Panel: No results for input(s): VITAMINB12, FOLATE, FERRITIN, TIBC, IRON, RETICCTPCT in the last 72 hours. Urine analysis:    Component Value Date/Time   COLORURINE YELLOW 08/27/2013 0546   APPEARANCEUR CLOUDY (A) 08/27/2013 0546   LABSPEC 1.026 08/27/2013 0546   PHURINE 7.5 08/27/2013 0546   GLUCOSEU NEGATIVE 08/27/2013 0546   HGBUR TRACE (A) 08/27/2013 0546   BILIRUBINUR SMALL (A) 08/27/2013 0546   KETONESUR NEGATIVE 08/27/2013 0546   PROTEINUR >300 (A) 08/27/2013 0546    UROBILINOGEN 0.2 08/27/2013 0546   NITRITE NEGATIVE 08/27/2013 0546   LEUKOCYTESUR NEGATIVE 08/27/2013 0546   Sepsis Labs: @LABRCNTIP (procalcitonin:4,lacticidven:4)  )No results found for this or any previous visit (from the past 240 hour(s)).       Radiology Studies: EEG  Result Date: 03/18/2019 Lora Havens, MD     03/18/2019 10:47 AM Patient Name: Toshika Parrow MRN: [id number] Epilepsy Attending: Lora Havens Referring Physician/Provider: Dr Ileene Musa Date: 03/18/2019 Duration: 25.02 mins Patient history: 69yo F who presented with AMS. EEG to assess for seizure. Level of alertness: awake AEDs during EEG study: None Technical aspects: This EEG study was done with scalp electrodes positioned according to the 10-20 International system of electrode placement. Electrical activity was acquired at a sampling rate of 500Hz  and reviewed with a high frequency filter of 70Hz  and a low frequency filter of 1Hz . EEG data were recorded continuously and digitally stored. DESCRIPTION: During awake state, no clear posterior dominant rhythm was noted. EEG showed continuous generalized 5-7hz  theta slowing as well as intermittent 3 to 4 Hz generalized delta slowing.  Physiologic photic driving was  not seen during photic stimulation.  Hyperventilation was not performed due to SOB. ABNORMALITY - Continuous slow, generalized IMPRESSION: This study is suggestive of mild to moderate diffuse encephalopathy, nonspecific to etiology. No seizures or epileptiform discharges were seen throughout the recording. Lora Havens   DG Chest 1 View  Result Date: 04/11/2019 CLINICAL DATA:  Initial evaluation for acute altered mental status, unwitnessed fall. EXAM: CHEST  1 VIEW COMPARISON:  Prior radiograph from 02/05/2019. FINDINGS: Moderate cardiomegaly, stable. Mediastinal silhouette within normal limits. Aortic atherosclerosis. Lungs hypoinflated. Diffuse pulmonary vascular congestion with interstitial prominence,  compatible with moderate pulmonary interstitial edema. No definite pleural effusion. Superimposed infection would be difficult to exclude, and could be considered in the correct clinical setting. No pneumothorax. No acute osseous finding. IMPRESSION: 1. Cardiomegaly with moderate diffuse pulmonary interstitial edema. Superimposed infection would be difficult to exclude, and could be considered in the correct clinical setting. 2.  Aortic Atherosclerosis (ICD10-I70.0). Electronically Signed   By: Jeannine Boga M.D.   On: 04/01/2019 04:43   CT Head Wo Contrast  Result Date: 04/07/2019 CLINICAL DATA:  Unwitnessed fall with head trauma EXAM: CT HEAD WITHOUT CONTRAST CT CERVICAL SPINE WITHOUT CONTRAST TECHNIQUE: Multidetector CT imaging of the head and cervical spine was performed following the standard protocol without intravenous contrast. Multiplanar CT image reconstructions of the cervical spine were also generated. COMPARISON:  None. FINDINGS: CT HEAD FINDINGS Brain: No evidence of acute infarction, hemorrhage, hydrocephalus, extra-axial collection or mass lesion/mass effect. Small remote appearing right cerebellar infarct. Age congruent cerebral volume loss. Vascular: No hyperdense vessel or unexpected calcification. Skull: Normal. Negative for fracture or focal lesion. Sinuses/Orbits: No evidence of injury. CT CERVICAL SPINE FINDINGS Alignment: No traumatic malalignment. There is C4-5 anterolisthesis associated with advanced facet arthropathy. Skull base and vertebrae: Negative for acute fracture or aggressive bone lesion. There is limitation by motion artifact Soft tissues and spinal canal: No prevertebral fluid or swelling. No visible canal hematoma. Disc levels: C4-5 and C5-6 disc narrowing and endplate degeneration. Facet osteoarthritis most notable on the left at C4-5 where there is calcified intra and periarticular mass, likely gouty. The associated facet is eroded. This calcified mass encroaches on  the left foramen. Upper chest: Apical emphysematous changes Other: Scanogram there is cardiomegaly and interstitial coarsening. Chest x-ray is pending. Motion artifact, but overall diagnostic. IMPRESSION: 1. No evidence of acute intracranial or cervical spine injury. 2. Calcified articular and periarticular mass on the left at C4-5 with erosive arthropathy, a gouty appearance. Electronically Signed   By: Monte Fantasia M.D.   On: 03/27/2019 04:45   CT Angio Chest PE W/Cm &/Or Wo Cm  Result Date: 03/18/2019 CLINICAL DATA:  Hypoxia and left lower quadrant pain, initial encounter EXAM: CT ANGIOGRAPHY CHEST CT ABDOMEN AND PELVIS WITH CONTRAST TECHNIQUE: Multidetector CT imaging of the chest was performed using the standard protocol during bolus administration of intravenous contrast. Multiplanar CT image reconstructions and MIPs were obtained to evaluate the vascular anatomy. Multidetector CT imaging of the abdomen and pelvis was performed using the standard protocol during bolus administration of intravenous contrast. CONTRAST:  37mL OMNIPAQUE IOHEXOL 300 MG/ML  SOLN COMPARISON:  02/05/2019 CT of the abdomen, 01/16/2019 PET-CT FINDINGS: CTA CHEST FINDINGS Cardiovascular: Thoracic aorta demonstrates atherosclerotic calcifications without aneurysmal dilatation or dissection. Coronary calcifications are seen. Mild cardiac enlargement is noted. The right atrium is enlarged and there is significant reflux of contrast material into the IVC and hepatic veins likely related to increased central venous pressure. The pulmonary artery shows  a normal branching pattern. No focal filling defects are identified to suggest pulmonary emboli. Mediastinum/Nodes: Thoracic inlet is within normal limits. Stable adenopathy anterior to the right mainstem bronchus is seen similar to that noted on prior PET-CT. Scattered AP window lymph nodes are again seen and stable. Lungs/Pleura: 16 mm spiculated lesion is noted in the right upper lobe  previously noted to be hypermetabolic on PET-CT consistent with the given clinical history of lung carcinoma. Diffuse emphysematous changes are noted. No other nodules are seen. A right-sided pleural effusion is noted which has increased in the interval from the prior exam. Emphysematous changes are noted on the left without focal nodules. Musculoskeletal: Degenerative changes of the thoracic spine are noted. No acute rib abnormality is seen. No metastatic lesions are noted. Review of the MIP images confirms the above findings. CT ABDOMEN and PELVIS FINDINGS Hepatobiliary: No focal liver abnormality is seen. No gallstones, gallbladder wall thickening, or biliary dilatation. Pancreas: Unremarkable. No pancreatic ductal dilatation or surrounding inflammatory changes. Spleen: Normal in size without focal abnormality. Adrenals/Urinary Tract: Adrenal glands are stable in appearance from the recent PET-CT. No significant increased activity is noted. Kidneys demonstrate cortical thinning with cystic change stable from prior CT examinations. The bladder is decompressed. Stomach/Bowel: Diverticular change of the colon is noted without evidence of diverticulitis. No obstructive or inflammatory changes of the large or small bowel are seen. The stomach is within normal limits. Vascular/Lymphatic: Aortic atherosclerosis. No enlarged abdominal or pelvic lymph nodes. Reproductive: Uterus and bilateral adnexa are unremarkable. Other: Mild ascites is noted similar to that seen on prior PET-CT. Changes of anasarca are again noted. Musculoskeletal: Degenerative changes of the lumbar spine are seen. No acute bony abnormality is noted. Review of the MIP images confirms the above findings. IMPRESSION: CTA of the chest: No evidence of pulmonary emboli. Changes suggestive of increased central venous pressure Right upper lobe spiculated mass with associated lymphadenopathy consistent with the known history of lung carcinoma. This is stable  from the prior exam. CT of the abdomen and pelvis: Diverticulosis without diverticulitis. Stable renal atrophy. Ascites with changes of anasarca. Electronically Signed   By: Inez Catalina M.D.   On: 03/29/2019 22:13   CT Cervical Spine Wo Contrast  Result Date: 03/31/2019 CLINICAL DATA:  Unwitnessed fall with head trauma EXAM: CT HEAD WITHOUT CONTRAST CT CERVICAL SPINE WITHOUT CONTRAST TECHNIQUE: Multidetector CT imaging of the head and cervical spine was performed following the standard protocol without intravenous contrast. Multiplanar CT image reconstructions of the cervical spine were also generated. COMPARISON:  None. FINDINGS: CT HEAD FINDINGS Brain: No evidence of acute infarction, hemorrhage, hydrocephalus, extra-axial collection or mass lesion/mass effect. Small remote appearing right cerebellar infarct. Age congruent cerebral volume loss. Vascular: No hyperdense vessel or unexpected calcification. Skull: Normal. Negative for fracture or focal lesion. Sinuses/Orbits: No evidence of injury. CT CERVICAL SPINE FINDINGS Alignment: No traumatic malalignment. There is C4-5 anterolisthesis associated with advanced facet arthropathy. Skull base and vertebrae: Negative for acute fracture or aggressive bone lesion. There is limitation by motion artifact Soft tissues and spinal canal: No prevertebral fluid or swelling. No visible canal hematoma. Disc levels: C4-5 and C5-6 disc narrowing and endplate degeneration. Facet osteoarthritis most notable on the left at C4-5 where there is calcified intra and periarticular mass, likely gouty. The associated facet is eroded. This calcified mass encroaches on the left foramen. Upper chest: Apical emphysematous changes Other: Scanogram there is cardiomegaly and interstitial coarsening. Chest x-ray is pending. Motion artifact, but overall diagnostic. IMPRESSION:  1. No evidence of acute intracranial or cervical spine injury. 2. Calcified articular and periarticular mass on the  left at C4-5 with erosive arthropathy, a gouty appearance. Electronically Signed   By: Monte Fantasia M.D.   On: 04/13/2019 04:45   CT Abdomen Pelvis W Contrast  Result Date: 03/28/2019 CLINICAL DATA:  Hypoxia and left lower quadrant pain, initial encounter EXAM: CT ANGIOGRAPHY CHEST CT ABDOMEN AND PELVIS WITH CONTRAST TECHNIQUE: Multidetector CT imaging of the chest was performed using the standard protocol during bolus administration of intravenous contrast. Multiplanar CT image reconstructions and MIPs were obtained to evaluate the vascular anatomy. Multidetector CT imaging of the abdomen and pelvis was performed using the standard protocol during bolus administration of intravenous contrast. CONTRAST:  58mL OMNIPAQUE IOHEXOL 300 MG/ML  SOLN COMPARISON:  02/05/2019 CT of the abdomen, 01/16/2019 PET-CT FINDINGS: CTA CHEST FINDINGS Cardiovascular: Thoracic aorta demonstrates atherosclerotic calcifications without aneurysmal dilatation or dissection. Coronary calcifications are seen. Mild cardiac enlargement is noted. The right atrium is enlarged and there is significant reflux of contrast material into the IVC and hepatic veins likely related to increased central venous pressure. The pulmonary artery shows a normal branching pattern. No focal filling defects are identified to suggest pulmonary emboli. Mediastinum/Nodes: Thoracic inlet is within normal limits. Stable adenopathy anterior to the right mainstem bronchus is seen similar to that noted on prior PET-CT. Scattered AP window lymph nodes are again seen and stable. Lungs/Pleura: 16 mm spiculated lesion is noted in the right upper lobe previously noted to be hypermetabolic on PET-CT consistent with the given clinical history of lung carcinoma. Diffuse emphysematous changes are noted. No other nodules are seen. A right-sided pleural effusion is noted which has increased in the interval from the prior exam. Emphysematous changes are noted on the left without  focal nodules. Musculoskeletal: Degenerative changes of the thoracic spine are noted. No acute rib abnormality is seen. No metastatic lesions are noted. Review of the MIP images confirms the above findings. CT ABDOMEN and PELVIS FINDINGS Hepatobiliary: No focal liver abnormality is seen. No gallstones, gallbladder wall thickening, or biliary dilatation. Pancreas: Unremarkable. No pancreatic ductal dilatation or surrounding inflammatory changes. Spleen: Normal in size without focal abnormality. Adrenals/Urinary Tract: Adrenal glands are stable in appearance from the recent PET-CT. No significant increased activity is noted. Kidneys demonstrate cortical thinning with cystic change stable from prior CT examinations. The bladder is decompressed. Stomach/Bowel: Diverticular change of the colon is noted without evidence of diverticulitis. No obstructive or inflammatory changes of the large or small bowel are seen. The stomach is within normal limits. Vascular/Lymphatic: Aortic atherosclerosis. No enlarged abdominal or pelvic lymph nodes. Reproductive: Uterus and bilateral adnexa are unremarkable. Other: Mild ascites is noted similar to that seen on prior PET-CT. Changes of anasarca are again noted. Musculoskeletal: Degenerative changes of the lumbar spine are seen. No acute bony abnormality is noted. Review of the MIP images confirms the above findings. IMPRESSION: CTA of the chest: No evidence of pulmonary emboli. Changes suggestive of increased central venous pressure Right upper lobe spiculated mass with associated lymphadenopathy consistent with the known history of lung carcinoma. This is stable from the prior exam. CT of the abdomen and pelvis: Diverticulosis without diverticulitis. Stable renal atrophy. Ascites with changes of anasarca. Electronically Signed   By: Inez Catalina M.D.   On: 04/06/2019 22:13   DG Chest Port 1 View  Result Date: 04/07/2019 CLINICAL DATA:  69 year old female with hypoxia and fatigue.  EXAM: PORTABLE CHEST 1 VIEW COMPARISON:  Chest radiograph dated 04/09/2019. FINDINGS: There is cardiomegaly with vascular congestion and edema. Superimposed pneumonia is not excluded. Clinical correlation is recommended. No large pleural effusion. There is no pneumothorax. Atherosclerotic calcification of the aorta. No acute osseous pathology. IMPRESSION: Cardiomegaly with findings of CHF relatively similar to the earlier exam. Clinical correlation and follow-up recommended. Electronically Signed   By: Anner Crete M.D.   On: 04/08/2019 18:03        Scheduled Meds: . allopurinol  100 mg Oral Daily  . atorvastatin  10 mg Oral q1800  . [START ON 03/19/2019] cinacalcet  30 mg Oral Q breakfast  . enoxaparin (LOVENOX) injection  40 mg Subcutaneous Daily  . midodrine  5 mg Oral BID WC  . thiamine  100 mg Oral Daily   Continuous Infusions:   LOS: 0 days    Time spent:35min  Domenic Polite, MD [facility]  03/18/2019, 11:58 AM

## 2019-03-18 NOTE — Consult Note (Signed)
[facility] CM Inpatient Consult   03/18/2019  Jessica Clarke [birth year] [id number]    Alerted by the [facility] Embedded Social Worker of patient's admission in the Embedded Chronic Care Management program at [facility].  Patient was screened for [facility] [employer] [facility]  Care Management services. Patient will have the transition of care call conducted by the primary care provider. Chart review reveals and from MD history today as includes but not limited to as follows:  Jessica Clarke a 69 y.o.femalewith medical history significantofCAD s/p NSTEMI, combined CHF, HTN, stage IIIb/IV adenocarcinoma of right lung, originally diagnosed in 01/2018.  Was seen in the emergency room 2 days ago following a fall and 1-2 episodes of vomiting.  Patient admitted with Encephalopathy.  Primary Care Provider: Arnette Felts, FNP  At [facility].  Plan: Will follow up with the Embedded [facility] RN and Social Worker  Care Management and made aware of any follow up needs or changes in disposition.  Please contact for further questions,  Charlesetta Shanks, RN BSN CCM [facility]  [phone] business mobile phone Toll free office [phone]  Fax number: [phone] [email or web address]

## 2019-03-18 NOTE — ED Provider Notes (Signed)
[facility] DEPARTMENT Provider Note   CSN: [id number] Arrival date & time: 03/27/2019  1553     History Chief Complaint  Patient presents with  . Headache  . Fatigue    Jessica Clarke is a 69 y.o. female.  Patient was seen between 3 and 7 this morning.  Discharged home.  Patient has a known history of lung CA.  Followed by oncology.  Patient also has known to dialysis patient.  But has not been very consistent with getting her dialysis done.  However she did go to dialysis after discharge today.  She had her session completed all but 15 minutes and they sent her here because of altered mental status changes.  Patient arrived here requiring oxygen not normally on oxygen.  They had documented an oxygen sat 80% and then another one at 85% patient placed on 3 L.  And oxygen sats were fine.  Patient not very cooperative but not mean but would answer yes or no questions by shaking her head.  Past medical history significant for end-stage renal disease on dialysis congestive heart failure lung cancer currently being treated.  Past history of alcohol abuse.        Past Medical History:  Diagnosis Date  . Abnormal Pap smear    ASCUS ?HGSIL  . Anemia   . Arthritis   . Cancer ([place])    lung ( per daughter)  . Cervical dysplasia   . CHF (congestive heart failure) ([place])   . Chronic kidney disease   . Constipation   . Constipation   . Dyspnea   . GERD (gastroesophageal reflux disease)   . Gout   . Hyperlipidemia   . Hypertension   . Myocardial infarction ([place]) 2015  . Obesity   . Pneumonia   . Uterine polyp     Patient Active Problem List   Diagnosis Date Noted  . Encounter for antineoplastic chemotherapy 03/11/2019  . Acute pulmonary edema ([place]) 02/05/2019  . Enteritis 02/05/2019  . Acute respiratory failure with hypoxia ([place]) 02/05/2019  . Adenocarcinoma of right lung, stage 3 ([place]) 12/26/2018  . Goals of care, counseling/discussion 12/26/2018  .  Mediastinal adenopathy 12/19/2018  . Lung mass 11/05/2018  . Polyp of colon   . Diarrhea 10/18/2018  . Rectal bleeding 10/18/2018  . Symptomatic anemia 10/17/2018  . Lung nodule < 6cm on CT 03/05/2018  . Malnutrition of moderate degree 08/28/2016  . Respiratory distress 08/26/2016  . ESRD (end stage renal disease) on dialysis ([place]) 06/08/2015  . CAD- non obstructive RI disease 08/26/13 08/27/2013  . NSTEMI-08/25/13- non obstructive CAD at cath 08/26/13 08/26/2013  . Chronic combined systolic and diastolic heart failure ([place]) 08/26/2013  . Alcohol abuse 08/25/2013  . Respiratory failure with hypoxia and hypercapnia ([place]) 08/25/2013  . Flash pulmonary edema ([place]) 08/25/2013  . Encounter for adequacy testing for hemodialysis ([place]) 06/26/2012  . End stage renal disease ([place]) 12/27/2011  . ASCUS (atypical squamous cells of undetermined significance) on Pap smear 09/09/2010  . Hypertension 09/09/2010  . Cervical polyp 09/09/2010  . Smoker 09/09/2010  . Post-menopausal bleeding 09/09/2010  . Gout 09/09/2010    Past Surgical History:  Procedure Laterality Date  . AV FISTULA PLACEMENT  01/18/2012   Procedure: ARTERIOVENOUS (AV) FISTULA CREATION;  Surgeon: Angelia Mould, MD;  Location: [facility];  Service: Vascular;  Laterality: Left;  . BIOPSY  10/20/2018   Procedure: BIOPSY;  Surgeon: Thornton Park, MD;  Location: [facility];  Service: Gastroenterology;;  .  COLONOSCOPY WITH PROPOFOL N/A 10/20/2018   Procedure: COLONOSCOPY WITH PROPOFOL;  Surgeon: Thornton Park, MD;  Location: [facility];  Service: Gastroenterology;  Laterality: N/A;  . DILATION AND CURETTAGE OF UTERUS    . HYSTEROSCOPY    . LEEP    . LEFT HEART CATHETERIZATION WITH CORONARY ANGIOGRAM N/A 08/26/2013   Procedure: LEFT HEART CATHETERIZATION WITH CORONARY ANGIOGRAM;  Surgeon: Leonie Man, MD;  Location: [facility] CATH LAB;  Service: Cardiovascular;  Laterality: N/A;  . POLYPECTOMY  10/20/2018   Procedure: POLYPECTOMY;   Surgeon: Thornton Park, MD;  Location: [facility] ENDOSCOPY;  Service: Gastroenterology;;  . REVISON OF ARTERIOVENOUS FISTULA Left 6/81/1572   Procedure: PLICATION OF ARTERIOVENOUS FISTULA;  Surgeon: Conrad Gardner, MD;  Location: [facility];  Service: Vascular;  Laterality: Left;  Marland Kitchen VIDEO BRONCHOSCOPY WITH ENDOBRONCHIAL ULTRASOUND N/A 12/19/2018   Procedure: VIDEO BRONCHOSCOPY WITH ENDOBRONCHIAL ULTRASOUND;  Surgeon: Garner Nash, DO;  Location: [facility] OR;  Service: Thoracic;  Laterality: N/A;     OB History    Gravida  2   Para  1   Term      Preterm      AB  1   Living  1     SAB      TAB  1   Ectopic      Multiple      Live Births              Family History  Problem Relation Age of Onset  . Esophageal cancer Other   . Pancreatic cancer Other   . Heart disease Mother   . Hypertension Mother   . Breast cancer Sister   . Esophageal cancer Sister   . Cancer Sister   . Deep vein thrombosis Sister   . Diabetes Sister   . Hypertension Sister   . Hypertension Daughter   . Colon cancer Neg Hx     Social History   Tobacco Use  . Smoking status: Current Every Day Smoker    Packs/day: 0.25    Years: 30.00    Pack years: 7.50    Types: Cigarettes  . Smokeless tobacco: Never Used  . Tobacco comment: smoking cessation  Substance Use Topics  . Alcohol use: Not Currently  . Drug use: No    Home Medications Prior to Admission medications   Medication Sig Start Date End Date Taking? Authorizing Provider  acetaminophen (TYLENOL) 325 MG tablet Take 325-650 mg by mouth every 8 (eight) hours as needed for mild pain.     [provider]  allopurinol (ZYLOPRIM) 100 MG tablet Take 100 mg by mouth daily.      [provider]  Amino Acids-Protein Hydrolys (FEEDING SUPPLEMENT, PRO-STAT SUGAR FREE 64,) LIQD Take 30 mLs by mouth 2 (two) times daily. 10/26/18   Hosie Poisson, MD  atorvastatin (LIPITOR) 10 MG tablet Take 10 mg by mouth at bedtime.  01/30/16    [provider]  B Complex-Folic Acid (B COMPLEX FORMULA 1) TABS Take 1 tablet by mouth daily.  07/11/17   [provider]  calcitRIOL (ROCALTROL) 0.5 MCG capsule Take 2 capsules (1 mcg total) by mouth every Monday, Wednesday, and Friday with hemodialysis. 10/28/18   Hosie Poisson, MD  cinacalcet (SENSIPAR) 30 MG tablet Take 1 tablet (30 mg total) by mouth daily with supper. 10/26/18   Hosie Poisson, MD  colchicine 0.6 MG tablet Take 0.6 mg by mouth daily as needed (as directed for gout flares or pain).  08/14/18  [provider]  Darbepoetin Alfa (ARANESP) 60 MCG/0.3ML SOSY injection Inject 0.3 mLs (60 mcg total) into the vein every Friday with hemodialysis. 11/01/18   Hosie Poisson, MD  loperamide (IMODIUM) 2 MG capsule Take 2 mg by mouth daily as needed for diarrhea or loose stools.  06/24/18 06/23/19  [provider]  midodrine (PROAMATINE) 10 MG tablet Take 1 tablet (10 mg total) by mouth 2 (two) times daily with a meal. 10/26/18   Hosie Poisson, MD  multivitamin (RENA-VIT) TABS tablet Take 1 tablet by mouth at bedtime. 10/26/18   Hosie Poisson, MD  prochlorperazine (COMPAZINE) 10 MG tablet Take 1 tablet (10 mg total) by mouth every 6 (six) hours as needed for nausea or vomiting. 02/06/19   Heilingoetter, Cassandra L, PA-C  promethazine (PHENERGAN) 25 MG suppository Place 1 suppository (25 mg total) rectally every 6 (six) hours as needed for nausea or vomiting. 10/17/79   Delora Fuel, MD  promethazine (PHENERGAN) 25 MG tablet Take 1 tablet (25 mg total) by mouth every 6 (six) hours as needed for nausea or vomiting. 09/14/97   Delora Fuel, MD  thiamine (VITAMIN B-1) 100 MG tablet Take 100 mg by mouth daily.    [provider]  VELPHORO 500 MG chewable tablet Chew 1,000 mg by mouth 3 (three) times daily with meals.  06/30/16   [provider]  Vitamin D, Cholecalciferol, 25 MCG (1000 UT) TABS Take 1,000 Units by mouth daily.  08/12/18 08/11/19  [provider]    Allergies    Patient has no known allergies.  Review of Systems   Review of Systems  Unable to perform ROS: Mental status change    Physical Exam Updated Vital Signs BP 120/69   Pulse 65   Resp (!) 21   SpO2 100%   Physical Exam Vitals and nursing note reviewed.  Constitutional:      General: She is not in acute distress.    Appearance: Normal appearance. She is well-developed.  HENT:     Head: Normocephalic and atraumatic.  Eyes:     Extraocular Movements: Extraocular movements intact.     Conjunctiva/sclera: Conjunctivae normal.     Pupils: Pupils are equal, round, and reactive to light.  Cardiovascular:     Rate and Rhythm: Normal rate and regular rhythm.     Heart sounds: No murmur.  Pulmonary:     Effort: Pulmonary effort is normal. No respiratory distress.     Breath sounds: Normal breath sounds.  Abdominal:     Palpations: Abdomen is soft.     Tenderness: There is no abdominal tenderness.  Musculoskeletal:        General: Normal range of motion.     Cervical back: Normal range of motion and neck supple.  Skin:    General: Skin is warm and dry.  Neurological:     Mental Status: She is alert.     Comments: Patient is awake and will answer yes or no questions.  But does not want to completely verbalize.  This is the same way she was when she was seen earlier this morning.     ED Results / Procedures / Treatments   Labs (all labs ordered are listed, but only abnormal results are displayed) Labs Reviewed  COMPREHENSIVE METABOLIC PANEL - Abnormal; Notable for the following components:      Result Value   Potassium 3.3 (*)    Chloride 92 (*)    Creatinine, Ser 4.94 (*)    Albumin  2.6 (*)    Alkaline Phosphatase 129 (*)    GFR calc non Af Amer 8 (*)    GFR calc Af Amer 10 (*)    All other components within normal limits  CBC WITH DIFFERENTIAL/PLATELET - Abnormal; Notable for the following components:   RDW 17.4 (*)    Lymphs Abs 0.6  (*)    All other components within normal limits  LACTIC ACID, PLASMA  LIPASE, BLOOD  POC SARS CORONAVIRUS 2 AG -  ED    EKG None  Radiology DG Chest 1 View  Result Date: 04/10/2019 CLINICAL DATA:  Initial evaluation for acute altered mental status, unwitnessed fall. EXAM: CHEST  1 VIEW COMPARISON:  Prior radiograph from 02/05/2019. FINDINGS: Moderate cardiomegaly, stable. Mediastinal silhouette within normal limits. Aortic atherosclerosis. Lungs hypoinflated. Diffuse pulmonary vascular congestion with interstitial prominence, compatible with moderate pulmonary interstitial edema. No definite pleural effusion. Superimposed infection would be difficult to exclude, and could be considered in the correct clinical setting. No pneumothorax. No acute osseous finding. IMPRESSION: 1. Cardiomegaly with moderate diffuse pulmonary interstitial edema. Superimposed infection would be difficult to exclude, and could be considered in the correct clinical setting. 2.  Aortic Atherosclerosis (ICD10-I70.0). Electronically Signed   By: Jeannine Boga M.D.   On: 03/27/2019 04:43   CT Head Wo Contrast  Result Date: 03/24/2019 CLINICAL DATA:  Unwitnessed fall with head trauma EXAM: CT HEAD WITHOUT CONTRAST CT CERVICAL SPINE WITHOUT CONTRAST TECHNIQUE: Multidetector CT imaging of the head and cervical spine was performed following the standard protocol without intravenous contrast. Multiplanar CT image reconstructions of the cervical spine were also generated. COMPARISON:  None. FINDINGS: CT HEAD FINDINGS Brain: No evidence of acute infarction, hemorrhage, hydrocephalus, extra-axial collection or mass lesion/mass effect. Small remote appearing right cerebellar infarct. Age congruent cerebral volume loss. Vascular: No hyperdense vessel or unexpected calcification. Skull: Normal. Negative for fracture or focal lesion. Sinuses/Orbits: No evidence of injury. CT CERVICAL SPINE FINDINGS Alignment: No traumatic malalignment.  There is C4-5 anterolisthesis associated with advanced facet arthropathy. Skull base and vertebrae: Negative for acute fracture or aggressive bone lesion. There is limitation by motion artifact Soft tissues and spinal canal: No prevertebral fluid or swelling. No visible canal hematoma. Disc levels: C4-5 and C5-6 disc narrowing and endplate degeneration. Facet osteoarthritis most notable on the left at C4-5 where there is calcified intra and periarticular mass, likely gouty. The associated facet is eroded. This calcified mass encroaches on the left foramen. Upper chest: Apical emphysematous changes Other: Scanogram there is cardiomegaly and interstitial coarsening. Chest x-ray is pending. Motion artifact, but overall diagnostic. IMPRESSION: 1. No evidence of acute intracranial or cervical spine injury. 2. Calcified articular and periarticular mass on the left at C4-5 with erosive arthropathy, a gouty appearance. Electronically Signed   By: Monte Fantasia M.D.   On: 04/01/2019 04:45   CT Angio Chest PE W/Cm &/Or Wo Cm  Result Date: 04/12/2019 CLINICAL DATA:  Hypoxia and left lower quadrant pain, initial encounter EXAM: CT ANGIOGRAPHY CHEST CT ABDOMEN AND PELVIS WITH CONTRAST TECHNIQUE: Multidetector CT imaging of the chest was performed using the standard protocol during bolus administration of intravenous contrast. Multiplanar CT image reconstructions and MIPs were obtained to evaluate the vascular anatomy. Multidetector CT imaging of the abdomen and pelvis was performed using the standard protocol during bolus administration of intravenous contrast. CONTRAST:  55mL OMNIPAQUE IOHEXOL 300 MG/ML  SOLN COMPARISON:  02/05/2019 CT of the abdomen, 01/16/2019 PET-CT FINDINGS: CTA CHEST FINDINGS Cardiovascular: Thoracic aorta demonstrates atherosclerotic  calcifications without aneurysmal dilatation or dissection. Coronary calcifications are seen. Mild cardiac enlargement is noted. The right atrium is enlarged and there  is significant reflux of contrast material into the IVC and hepatic veins likely related to increased central venous pressure. The pulmonary artery shows a normal branching pattern. No focal filling defects are identified to suggest pulmonary emboli. Mediastinum/Nodes: Thoracic inlet is within normal limits. Stable adenopathy anterior to the right mainstem bronchus is seen similar to that noted on prior PET-CT. Scattered AP window lymph nodes are again seen and stable. Lungs/Pleura: 16 mm spiculated lesion is noted in the right upper lobe previously noted to be hypermetabolic on PET-CT consistent with the given clinical history of lung carcinoma. Diffuse emphysematous changes are noted. No other nodules are seen. A right-sided pleural effusion is noted which has increased in the interval from the prior exam. Emphysematous changes are noted on the left without focal nodules. Musculoskeletal: Degenerative changes of the thoracic spine are noted. No acute rib abnormality is seen. No metastatic lesions are noted. Review of the MIP images confirms the above findings. CT ABDOMEN and PELVIS FINDINGS Hepatobiliary: No focal liver abnormality is seen. No gallstones, gallbladder wall thickening, or biliary dilatation. Pancreas: Unremarkable. No pancreatic ductal dilatation or surrounding inflammatory changes. Spleen: Normal in size without focal abnormality. Adrenals/Urinary Tract: Adrenal glands are stable in appearance from the recent PET-CT. No significant increased activity is noted. Kidneys demonstrate cortical thinning with cystic change stable from prior CT examinations. The bladder is decompressed. Stomach/Bowel: Diverticular change of the colon is noted without evidence of diverticulitis. No obstructive or inflammatory changes of the large or small bowel are seen. The stomach is within normal limits. Vascular/Lymphatic: Aortic atherosclerosis. No enlarged abdominal or pelvic lymph nodes. Reproductive: Uterus and  bilateral adnexa are unremarkable. Other: Mild ascites is noted similar to that seen on prior PET-CT. Changes of anasarca are again noted. Musculoskeletal: Degenerative changes of the lumbar spine are seen. No acute bony abnormality is noted. Review of the MIP images confirms the above findings. IMPRESSION: CTA of the chest: No evidence of pulmonary emboli. Changes suggestive of increased central venous pressure Right upper lobe spiculated mass with associated lymphadenopathy consistent with the known history of lung carcinoma. This is stable from the prior exam. CT of the abdomen and pelvis: Diverticulosis without diverticulitis. Stable renal atrophy. Ascites with changes of anasarca. Electronically Signed   By: Inez Catalina M.D.   On: 04/13/2019 22:13   CT Cervical Spine Wo Contrast  Result Date: 04/13/2019 CLINICAL DATA:  Unwitnessed fall with head trauma EXAM: CT HEAD WITHOUT CONTRAST CT CERVICAL SPINE WITHOUT CONTRAST TECHNIQUE: Multidetector CT imaging of the head and cervical spine was performed following the standard protocol without intravenous contrast. Multiplanar CT image reconstructions of the cervical spine were also generated. COMPARISON:  None. FINDINGS: CT HEAD FINDINGS Brain: No evidence of acute infarction, hemorrhage, hydrocephalus, extra-axial collection or mass lesion/mass effect. Small remote appearing right cerebellar infarct. Age congruent cerebral volume loss. Vascular: No hyperdense vessel or unexpected calcification. Skull: Normal. Negative for fracture or focal lesion. Sinuses/Orbits: No evidence of injury. CT CERVICAL SPINE FINDINGS Alignment: No traumatic malalignment. There is C4-5 anterolisthesis associated with advanced facet arthropathy. Skull base and vertebrae: Negative for acute fracture or aggressive bone lesion. There is limitation by motion artifact Soft tissues and spinal canal: No prevertebral fluid or swelling. No visible canal hematoma. Disc levels: C4-5 and C5-6 disc  narrowing and endplate degeneration. Facet osteoarthritis most notable on the left at C4-5  where there is calcified intra and periarticular mass, likely gouty. The associated facet is eroded. This calcified mass encroaches on the left foramen. Upper chest: Apical emphysematous changes Other: Scanogram there is cardiomegaly and interstitial coarsening. Chest x-ray is pending. Motion artifact, but overall diagnostic. IMPRESSION: 1. No evidence of acute intracranial or cervical spine injury. 2. Calcified articular and periarticular mass on the left at C4-5 with erosive arthropathy, a gouty appearance. Electronically Signed   By: Monte Fantasia M.D.   On: 04/13/2019 04:45   CT Abdomen Pelvis W Contrast  Result Date: 04/05/2019 CLINICAL DATA:  Hypoxia and left lower quadrant pain, initial encounter EXAM: CT ANGIOGRAPHY CHEST CT ABDOMEN AND PELVIS WITH CONTRAST TECHNIQUE: Multidetector CT imaging of the chest was performed using the standard protocol during bolus administration of intravenous contrast. Multiplanar CT image reconstructions and MIPs were obtained to evaluate the vascular anatomy. Multidetector CT imaging of the abdomen and pelvis was performed using the standard protocol during bolus administration of intravenous contrast. CONTRAST:  83mL OMNIPAQUE IOHEXOL 300 MG/ML  SOLN COMPARISON:  02/05/2019 CT of the abdomen, 01/16/2019 PET-CT FINDINGS: CTA CHEST FINDINGS Cardiovascular: Thoracic aorta demonstrates atherosclerotic calcifications without aneurysmal dilatation or dissection. Coronary calcifications are seen. Mild cardiac enlargement is noted. The right atrium is enlarged and there is significant reflux of contrast material into the IVC and hepatic veins likely related to increased central venous pressure. The pulmonary artery shows a normal branching pattern. No focal filling defects are identified to suggest pulmonary emboli. Mediastinum/Nodes: Thoracic inlet is within normal limits. Stable  adenopathy anterior to the right mainstem bronchus is seen similar to that noted on prior PET-CT. Scattered AP window lymph nodes are again seen and stable. Lungs/Pleura: 16 mm spiculated lesion is noted in the right upper lobe previously noted to be hypermetabolic on PET-CT consistent with the given clinical history of lung carcinoma. Diffuse emphysematous changes are noted. No other nodules are seen. A right-sided pleural effusion is noted which has increased in the interval from the prior exam. Emphysematous changes are noted on the left without focal nodules. Musculoskeletal: Degenerative changes of the thoracic spine are noted. No acute rib abnormality is seen. No metastatic lesions are noted. Review of the MIP images confirms the above findings. CT ABDOMEN and PELVIS FINDINGS Hepatobiliary: No focal liver abnormality is seen. No gallstones, gallbladder wall thickening, or biliary dilatation. Pancreas: Unremarkable. No pancreatic ductal dilatation or surrounding inflammatory changes. Spleen: Normal in size without focal abnormality. Adrenals/Urinary Tract: Adrenal glands are stable in appearance from the recent PET-CT. No significant increased activity is noted. Kidneys demonstrate cortical thinning with cystic change stable from prior CT examinations. The bladder is decompressed. Stomach/Bowel: Diverticular change of the colon is noted without evidence of diverticulitis. No obstructive or inflammatory changes of the large or small bowel are seen. The stomach is within normal limits. Vascular/Lymphatic: Aortic atherosclerosis. No enlarged abdominal or pelvic lymph nodes. Reproductive: Uterus and bilateral adnexa are unremarkable. Other: Mild ascites is noted similar to that seen on prior PET-CT. Changes of anasarca are again noted. Musculoskeletal: Degenerative changes of the lumbar spine are seen. No acute bony abnormality is noted. Review of the MIP images confirms the above findings. IMPRESSION: CTA of the  chest: No evidence of pulmonary emboli. Changes suggestive of increased central venous pressure Right upper lobe spiculated mass with associated lymphadenopathy consistent with the known history of lung carcinoma. This is stable from the prior exam. CT of the abdomen and pelvis: Diverticulosis without diverticulitis. Stable renal atrophy. Ascites  with changes of anasarca. Electronically Signed   By: Inez Catalina M.D.   On: 03/29/2019 22:13   DG Chest Port 1 View  Result Date: 04/04/2019 CLINICAL DATA:  69 year old female with hypoxia and fatigue. EXAM: PORTABLE CHEST 1 VIEW COMPARISON:  Chest radiograph dated 04/07/2019. FINDINGS: There is cardiomegaly with vascular congestion and edema. Superimposed pneumonia is not excluded. Clinical correlation is recommended. No large pleural effusion. There is no pneumothorax. Atherosclerotic calcification of the aorta. No acute osseous pathology. IMPRESSION: Cardiomegaly with findings of CHF relatively similar to the earlier exam. Clinical correlation and follow-up recommended. Electronically Signed   By: Anner Crete M.D.   On: 03/31/2019 18:03    Procedures Procedures (including critical care time)  Medications Ordered in ED Medications  iohexol (OMNIPAQUE) 300 MG/ML solution 80 mL (80 mLs Intravenous Contrast Given 03/18/2019 2202)    ED Course  I have reviewed the triage vital signs and the nursing notes.  Pertinent labs & imaging results that were available during my care of the patient were reviewed by me and considered in my medical decision making (see chart for details).    MDM Rules/Calculators/A&P                      Patient has remained hypoxic here on room air sats down to around 86%.  On oxygen she is fine.  Patient also with somewhat of a depressed mental status.  But will answer yes or no questions.  No significant focal deficit.  Extensive work-up to include CT angio chest without evidence of any pulmonary embolus she does have known  lung CA.  Patient also did complete dialysis today except for 15 minutes of it.  Electrolytes look good there was a little bit of vascular congestion but no significant pulmonary edema.  Covid testing here was negative lactic acid now is normal that was elevated when she was discharged home earlier.  Discussed with hospitalist due to the hypoxia patient is going to require admission.     Final Clinical Impression(s) / ED Diagnoses Final diagnoses:  Hypoxia  Altered mental status, unspecified altered mental status type    Rx / DC Orders ED Discharge Orders    None       Fredia Sorrow, MD 03/18/19 0120

## 2019-03-18 NOTE — Progress Notes (Signed)
EEG complete - results pending 

## 2019-03-18 NOTE — Consult Note (Signed)
Consultation Note Date: 03/18/2019   Patient Name: Jessica Clarke  DOB: Jan 23, 1951  MRN: [id number]  Age / Sex: 69 y.o., female  PCP: [name], [place] Referring Physician: Domenic Polite, MD  Reason for Consultation: Establishing goals of care  HPI/Patient Profile: 69 y.o. female  with past medical history of HTN, HLD, ESRD on HD, CAD, NSTEMI, CHF, and lung cancer admitted on 03/26/2019 with headache, AMS, and unwitnessed fall.  Found to have oxygen desaturation and required supplemental oxygenation. Acute respiratory failure secondary to pulmonary edema vs lung cancer. EEG with no seizure activity, suggestive of mild to moderate encephalopathy. Patient has missed all radiation and chemotherapy appointments so far. PMT consulted to assist with [place].   Clinical Assessment and Goals of Care: I have reviewed medical records including EPIC notes, labs and imaging, received report from Dr. Broadus John, assessed the patient and then met with patient to discuss diagnosis prognosis, [place], EOL wishes, disposition and options.  Patient's capacity to participate in goals of care conversation is unclear - she is slow to answer and asks me to repeat myself multiple times. To some of my questions she can only state "I do not know"; however, to other questions she does answer appropriately.   I introduced Palliative Medicine as specialized medical care for people living with serious illness. It focuses on providing relief from the symptoms and stress of a serious illness. The goal is to improve quality of life for both the patient and the family.  [name] tells me she has been living with her daughter; however she is unable to give further details about her functional status at home.    We discussed her current illness and what it means in the larger context of her on-going co-morbidities.  We discussed her dialysis to which Ms. Que makes it clear she is very  interested in continuing. I ask her about recent sessions of missed dialysis and she tells me she is unsure why she did not go. We also discussed her lung cancer and treatment for cancer. At first, she tells me she has started treatment but then says she has not yet started. I ask her about this and she tells me she cannot fit it into her schedule with dialysis.   I attempted to elicit values and goals of care important to the patient.  Patient tells me she does not want to die and she is interested in any measures that will prolong her life.   Advance directives and concepts specific to code status were considered and discussed. When I discussed full code status with [name] she tells me she would like to remain a full code "if it would help". I attempted to explain my concerns to [name] about a poor outcome if she required resuscitation measures. [name] does not respond to this and changes the subject.  I asked [name] permission to discuss the above with her daughter and she agrees. Attempted to call daughter, no answer, left voicemail. Will attempt to reach out to her again tomorrow.   Questions and concerns were addressed.    Primary Decision Maker PATIENT ? Unclear if patient has decision making capacity at this time, though does appear that mental status is improving. Unable to speak with daughter today.     SUMMARY OF RECOMMENDATIONS   - PMT will continue attempts to speak with daughter - At this time [name] expresses wishes to continue all aggressive medical therapies offered - Continue palliative care at home -  already connected to palliative services outpatient  Code Status/Advance Care Planning:  Full code  Prognosis:   Unable to determine  Discharge Planning: Home with Palliative Services - already connected to palliative services outpatient     Primary Diagnoses: Present on Admission: . Altered mental status . End stage renal disease ([place]) . CAD- non  obstructive RI disease 08/26/13 . Acute respiratory failure with hypoxia ([place]) . Adenocarcinoma of right lung, stage 3 ([place])   I have reviewed the medical record, interviewed the patient and family, and examined the patient. The following aspects are pertinent.  Past Medical History:  Diagnosis Date  . Abnormal Pap smear    ASCUS ?HGSIL  . Anemia   . Arthritis   . Cancer ([place])    lung ( per daughter)  . Cervical dysplasia   . CHF (congestive heart failure) ([place])   . Chronic kidney disease   . Constipation   . Constipation   . Dyspnea   . GERD (gastroesophageal reflux disease)   . Gout   . Hyperlipidemia   . Hypertension   . Myocardial infarction ([place]) 2015  . Obesity   . Pneumonia   . Uterine polyp    Social History   Socioeconomic History  . Marital status: Married    Spouse name: Not on file  . Number of children: 1  . Years of education: Not on file  . Highest education level: Not on file  Occupational History    Employer: UNEMPLOYED    Comment: Unemployed   Tobacco Use  . Smoking status: Current Every Day Smoker    Packs/day: 0.25    Years: 30.00    Pack years: 7.50    Types: Cigarettes  . Smokeless tobacco: Never Used  . Tobacco comment: smoking cessation  Substance and Sexual Activity  . Alcohol use: Not Currently  . Drug use: No  . Sexual activity: Not Currently    Birth control/protection: Post-menopausal    Comment: Did not ask  Other Topics Concern  . Not on file  Social History Narrative  . Not on file   Social Determinants of Health   Financial Resource Strain: Low Risk   . Difficulty of Paying Living Expenses: Not hard at all  Food Insecurity: No Food Insecurity  . Worried About Charity fundraiser in the Last Year: Never true  . Ran Out of Food in the Last Year: Never true  Transportation Needs: No Transportation Needs  . Lack of Transportation (Medical): No  . Lack of Transportation (Non-Medical): No  Physical Activity: Inactive  .  Days of Exercise per Week: 0 days  . Minutes of Exercise per Session: 0 min  Stress: No Stress Concern Present  . Feeling of Stress : Not at all  Social Connections:   . Frequency of Communication with Friends and Family: Not on file  . Frequency of Social Gatherings with Friends and Family: Not on file  . Attends Religious Services: Not on file  . Active Member of Clubs or Organizations: Not on file  . Attends Archivist Meetings: Not on file  . Marital Status: Not on file   Family History  Problem Relation Age of Onset  . Esophageal cancer Other   . Pancreatic cancer Other   . Heart disease Mother   . Hypertension Mother   . Breast cancer Sister   . Esophageal cancer Sister   . Cancer Sister   . Deep vein thrombosis Sister   . Diabetes Sister   .  Hypertension Sister   . Hypertension Daughter   . Colon cancer Neg Hx    Scheduled Meds: . allopurinol  100 mg Oral Daily  . atorvastatin  10 mg Oral q1800  . [START ON 03/19/2019] cinacalcet  30 mg Oral Q breakfast  . [START ON 03/19/2019] enoxaparin (LOVENOX) injection  30 mg Subcutaneous Daily  . midodrine  5 mg Oral BID WC  . thiamine  100 mg Oral Daily   Continuous Infusions: PRN Meds:. No Known Allergies Review of Systems  Constitutional: Positive for activity change.  Gastrointestinal: Positive for diarrhea.  Neurological: Positive for weakness.  denies pain  Physical Exam Constitutional:      General: She is not in acute distress. HENT:     Head: Normocephalic and atraumatic.  Cardiovascular:     Rate and Rhythm: Normal rate.  Pulmonary:     Effort: Pulmonary effort is normal.  Musculoskeletal:     Right lower leg: No edema.     Left lower leg: No edema.  Neurological:     Mental Status: She is alert and oriented to person, place, and time.  Psychiatric:        Behavior: Behavior is slowed and withdrawn.     Vital Signs: BP 120/74 (BP Location: Right Arm)   Pulse 65   Temp 98.7 F (37.1 C)  (Oral)   Resp 18   SpO2 98%  Pain Scale: 0-10   Pain Score: 0-No pain   SpO2: SpO2: 98 % O2 Device:SpO2: 98 % O2 Flow Rate: .O2 Flow Rate (L/min): 3 L/min  IO: Intake/output summary:   Intake/Output Summary (Last 24 hours) at 03/18/2019 1226 Last data filed at 03/18/2019 0900 Gross per 24 hour  Intake 300 ml  Output 0 ml  Net 300 ml    LBM:   Baseline Weight:   Most recent weight:       Palliative Assessment/Data: PPS 40%    Time Total: 50 minutes Greater than 50%  of this time was spent counseling and coordinating care related to the above assessment and plan.  Juel Burrow, DNP, AGNP-C Palliative Medicine Team [phone] Pager: [phone]

## 2019-03-18 NOTE — Chronic Care Management (AMB) (Signed)
  Chronic Care Management   Consult Note  03/18/2019 Name: Jessica Clarke MRN: [id number] DOB: 05/03/1950  Referred by: [name], [place] Reason for referral : Care Coordination   Patient is an active patient of [facility] and is eligible for the Chronic Disease Management program provided by the [facility] team.  Patient has been engaged by the embedded case management team.    Primary Care Provider: [name] FNP   Chart review reveals patient was admitted for Hypoxia and altered mental status.   Plan: CM team collaboration with [facility] Liaison notifying of patient current disposition. Embedded Care Management team will follow post discharge.     Daneen Schick, BSW, CDP Social Worker, Certified Dementia Practitioner [place] [facility] [place] [facility] [phone]

## 2019-03-18 NOTE — ED Notes (Signed)
Pt transported to 5M15C via cart by PCT. Pt conscious, breathing, and alert. Pt on 3lpm [place]. All belongings with pt. No distress noted. Pt on continuous monitoring.

## 2019-03-18 NOTE — ED Notes (Signed)
Pt removed from [place] and desaturated to 87% RA. Zackowski MD made aware. Pt placed back on 3lpm [place].

## 2019-03-18 NOTE — Consult Note (Signed)
[name] Renal Consultation Note  Indication for Consultation:  Management of ESRD/hemodialysis; anemia, hypertension/volume and secondary hyperparathyroidism  HPI: Jessica Clarke is a 69 y.o. female  ESRD on HD  MWF at [facility]. PMH: HTN, scleroderma, gout, CAD sp NSTEM , combined CHF ,ho GI bld sessile rectal polyp, , scattered diverticulosis,COPD ( long term smoker up to past yr.)stage IIIb/IV adenocarcinoma of right lung, originally diagnosed in 01/2018, lost to follow-up until 10/2018 , has missed several follow-up appointments with oncology and XRT , has not started chemo or radiation yet. Now admitted  With MS changes ,disoreitation,  Noted hypoxia in ER , but not dyspniec  Noted seen in ER  2 days ago after fall  And episodes vomiting  Was dc home with negative wu  Dc on prn Compazine and Phenergan .  Per ER notes Daughter gave ho pt attending HD yesterday 02/01 And was disoriented and sent to er after 3hr 81mn of her 4 hr tx   / noted she did miss hd 03/14/19 . Also not started chemo  Or radiation appointments  As planned for her  Now  In room 0 % O2 sat on  O2 . Pt . In room not able to give a clear history .does recognize me from outpt hd but generally confused to why she is here and soft spoken nad .   CT head without acute findings, MRI brain in 10/2018 was negative for metastasis    Past Medical History:  Diagnosis Date  . Abnormal Pap smear    ASCUS ?HGSIL  . Anemia   . Arthritis   . Cancer ([place]    lung ( per daughter)  . Cervical dysplasia   . CHF (congestive heart failure) ([place]   . Chronic kidney disease   . Constipation   . Constipation   . Dyspnea   . GERD (gastroesophageal reflux disease)   . Gout   . Hyperlipidemia   . Hypertension   . [place]ocardial infarction ([place]  . Obesity   . Pneumonia   . Uterine polyp     Past Surgical History:  Procedure Laterality Date  . AV FISTULA PLACEMENT  01/18/2012   Procedure:  ARTERIOVENOUS (AV) FISTULA CREATION;  Surgeon: CAngelia Mould MD;  [place]  Service: Vascular;  Laterality: Left;  . BIOPSY  10/20/2018   Procedure: BIOPSY;  Surgeon: BThornton Park MD;  [place]  Service: Gastroenterology;;  . COLONOSCOPY WITH PROPOFOL N/A 10/20/2018   Procedure: COLONOSCOPY WITH PROPOFOL;  Surgeon: BThornton Park MD;  [place]  Service: Gastroenterology;  Laterality: N/A;  . DILATION AND CURETTAGE OF UTERUS    . HYSTEROSCOPY    . LEEP    . LEFT HEART CATHETERIZATION WITH CORONARY ANGIOGRAM N/A 08/26/2013   Procedure: LEFT HEART CATHETERIZATION WITH CORONARY ANGIOGRAM;  Surgeon: DLeonie Man MD;  [facility];  Service: Cardiovascular;  Laterality: N/A;  . POLYPECTOMY  10/20/2018   Procedure: POLYPECTOMY;  Surgeon: BThornton Park MD;  [facility];  Service: Gastroenterology;;  . REVISON OF ARTERIOVENOUS FISTULA Left 19/52/8413  Procedure: PLICATION OF ARTERIOVENOUS FISTULA;  Surgeon: BConrad [place] MD;  [place]  Service: Vascular;  Laterality: Left;  .Marland KitchenVIDEO BRONCHOSCOPY WITH ENDOBRONCHIAL ULTRASOUND N/A 12/19/2018   Procedure: VIDEO BRONCHOSCOPY WITH ENDOBRONCHIAL ULTRASOUND;  Surgeon: IGarner Nash DO;  [facility];  Service: Thoracic;  Laterality: N/A;      Family History  Problem Relation Age  of Onset  . Esophageal cancer Other   . Pancreatic cancer Other   . Heart disease Mother   . Hypertension Mother   . Breast cancer Sister   . Esophageal cancer Sister   . Cancer Sister   . Deep vein thrombosis Sister   . Diabetes Sister   . Hypertension Sister   . Hypertension Daughter   . Colon cancer Neg Hx       reports that she has been smoking cigarettes. She has a 7.50 pack-year smoking history. She has never used smokeless tobacco. She reports previous alcohol use. She reports that she does not use drugs.  No Known Allergies  Prior to Admission medications   Medication  Sig Start Date End Date Taking? Authorizing Provider  acetaminophen (TYLENOL) 325 MG tablet Take 325-650 mg by mouth every 8 (eight) hours as needed for mild pain.     [provider]  allopurinol (ZYLOPRIM) 100 MG tablet Take 100 mg by mouth daily.      [provider]  Amino Acids-Protein Hydrolys (FEEDING SUPPLEMENT, PRO-STAT SUGAR FREE 64,) LIQD Take 30 mLs by mouth 2 (two) times daily. 10/26/18   Hosie Poisson, MD  atorvastatin (LIPITOR) 10 MG tablet Take 10 mg by mouth at bedtime.  01/30/16   [provider]  B Complex-Folic Acid (B COMPLEX FORMULA 1) TABS Take 1 tablet by mouth daily.  07/11/17   [provider]  calcitRIOL (ROCALTROL) 0.5 MCG capsule Take 2 capsules (1 mcg total) by mouth every Monday, Wednesday, and Friday with hemodialysis. 10/28/18   Hosie Poisson, MD  cinacalcet (SENSIPAR) 30 MG tablet Take 1 tablet (30 mg total) by mouth daily with supper. 10/26/18   Hosie Poisson, MD  colchicine 0.6 MG tablet Take 0.6 mg by mouth daily as needed (as directed for gout flares or pain).  08/14/18   [provider]  Darbepoetin Alfa (ARANESP) 60 MCG/0.3ML SOSY injection Inject 0.3 mLs (60 mcg total) into the vein every Friday with hemodialysis. 11/01/18   Hosie Poisson, MD  loperamide (IMODIUM) 2 MG capsule Take 2 mg by mouth daily as needed for diarrhea or loose stools.  06/24/18 06/23/19  [provider]  midodrine (PROAMATINE) 10 MG tablet Take 1 tablet (10 mg total) by mouth 2 (two) times daily with a meal. 10/26/18   Hosie Poisson, MD  multivitamin (RENA-VIT) TABS tablet Take 1 tablet by mouth at bedtime. 10/26/18   Hosie Poisson, MD  prochlorperazine (COMPAZINE) 10 MG tablet Take 1 tablet (10 mg total) by mouth every 6 (six) hours as needed for nausea or vomiting. 02/06/19   Heilingoetter, Cassandra L, PA-C  promethazine (PHENERGAN) 25 MG suppository Place 1 suppository (25 mg total) rectally every 6 (six) hours as needed for nausea or  vomiting. 10/17/79   Delora Fuel, MD  promethazine (PHENERGAN) 25 MG tablet Take 1 tablet (25 mg total) by mouth every 6 (six) hours as needed for nausea or vomiting. 0/1/75   Delora Fuel, MD  thiamine (VITAMIN B-1) 100 MG tablet Take 100 mg by mouth daily.    [provider]  VELPHORO 500 MG chewable tablet Chew 1,000 mg by mouth 3 (three) times daily with meals.  06/30/16   [provider]  Vitamin D, Cholecalciferol, 25 MCG (1000 UT) TABS Take 1,000 Units by mouth daily.  08/12/18 08/11/19  [provider]     Anti-infectives (From admission, onward)   None      Results for orders placed or performed during the  hospital encounter of 03/23/2019 (from the past 48 hour(s))  Comprehensive metabolic panel     Status: Abnormal   Collection Time: 04/13/2019  7:22 PM  Result Value Ref Range   Sodium 135 135 - 145 mmol/L   Potassium 3.3 (L) 3.5 - 5.1 mmol/L   Chloride 92 (L) 98 - 111 mmol/L   CO2 28 22 - 32 mmol/L   Glucose, Bld 94 70 - 99 mg/dL   BUN 23 8 - 23 mg/dL   Creatinine, Ser 4.94 (H) 0.44 - 1.00 mg/dL   Calcium 9.1 8.9 - 10.3 mg/dL   Total Protein 7.4 6.5 - 8.1 g/dL   Albumin 2.6 (L) 3.5 - 5.0 g/dL   AST 18 15 - 41 U/L   ALT 13 0 - 44 U/L   Alkaline Phosphatase 129 (H) 38 - 126 U/L   Total Bilirubin 0.8 0.3 - 1.2 mg/dL   GFR calc non Af Amer 8 (L) >60 mL/min   GFR calc Af Amer 10 (L) >60 mL/min   Anion gap 15 5 - 15    Comment: Performed at [facility], [address], [place], [place] [address]  CBC with Differential/Platelet     Status: Abnormal   Collection Time: 03/29/2019  7:22 PM  Result Value Ref Range   WBC 7.8 4.0 - 10.5 K/uL   RBC 4.20 3.87 - 5.11 MIL/uL   Hemoglobin 12.8 12.0 - 15.0 g/dL    Comment: REPEATED TO VERIFY DELTA CHECK NOTED    HCT 40.3 36.0 - 46.0 %   MCV 96.0 80.0 - 100.0 fL   MCH 30.5 26.0 - 34.0 pg   MCHC 31.8 30.0 - 36.0 g/dL   RDW 17.4 (H) 11.5 - 15.5 %   Platelets PLATELET CLUMPS NOTED ON SMEAR, UNABLE TO  ESTIMATE 150 - 400 K/uL   nRBC 0.0 0.0 - 0.2 %   Neutrophils Relative % 84 %   Neutro Abs 6.6 1.7 - 7.7 K/uL   Lymphocytes Relative 8 %   Lymphs Abs 0.6 (L) 0.7 - 4.0 K/uL   Monocytes Relative 7 %   Monocytes Absolute 0.6 0.1 - 1.0 K/uL   Eosinophils Relative 0 %   Eosinophils Absolute 0.0 0.0 - 0.5 K/uL   Basophils Relative 0 %   Basophils Absolute 0.0 0.0 - 0.1 K/uL   Immature Granulocytes 1 %   Abs Immature Granulocytes 0.04 0.00 - 0.07 K/uL    Comment: Performed at [facility], [place] [address] [place], [place] [address]  Lipase, blood     Status: None   Collection Time: 04/06/2019  7:22 PM  Result Value Ref Range   Lipase 17 11 - 51 U/L    Comment: Performed at [facility] [address], [place], [place] [address]  Lactic acid, plasma     Status: None   Collection Time: 03/25/2019  7:38 PM  Result Value Ref Range   Lactic Acid, Venous 1.7 0.5 - 1.9 mmol/L    Comment: Performed at [facility] [address] [place], [place] [address]  POC SARS Coronavirus 2 Ag-ED - Nasal Swab (BD Veritor Kit)     Status: None   Collection Time: 03/25/2019  8:29 PM  Result Value Ref Range   SARS Coronavirus 2 Ag NEGATIVE NEGATIVE    Comment: (NOTE) SARS-CoV-2 antigen NOT DETECTED.  Negative results are presumptive.  Negative results do not preclude SARS-CoV-2 infection and should not be used as the sole basis  for treatment or other patient management decisions, including infection  control decisions, particularly in the presence of clinical signs and  symptoms consistent with COVID-19, or in those who have been in contact with the virus.  Negative results must be combined with clinical observations, patient history, and epidemiological information. The expected result is Negative. Fact Sheet for Patients: PodPark.tn Fact Sheet for Healthcare Providers: GiftContent.is This test is not yet approved or cleared by the  [place] FDA and  has been authorized for detection and/or diagnosis of SARS-CoV-2 by FDA under an Emergency Use Authorization (EUA).  This EUA will remain in effect (meaning this test can be used) for the duration of  the COVID-19 de claration under Section 564(b)(1) of the Act, 21 U.S.C. section 360bbb-3(b)(1), unless the authorization is terminated or revoked sooner.   Basic metabolic panel     Status: Abnormal   Collection Time: 03/18/19  4:01 AM  Result Value Ref Range   Sodium 134 (L) 135 - 145 mmol/L   Potassium 4.2 3.5 - 5.1 mmol/L   Chloride 93 (L) 98 - 111 mmol/L   CO2 24 22 - 32 mmol/L   Glucose, Bld 81 70 - 99 mg/dL   BUN 29 (H) 8 - 23 mg/dL   Creatinine, Ser 5.33 (H) 0.44 - 1.00 mg/dL   Calcium 8.9 8.9 - 10.3 mg/dL   GFR calc non Af Amer 8 (L) >60 mL/min   GFR calc Af Amer 9 (L) >60 mL/min   Anion gap 17 (H) 5 - 15    Comment: Performed at [facility] [address] [place], [place] [address]  CBC     Status: Abnormal   Collection Time: 03/18/19  4:01 AM  Result Value Ref Range   WBC 9.3 4.0 - 10.5 K/uL   RBC 3.16 (L) 3.87 - 5.11 MIL/uL   Hemoglobin 9.7 (L) 12.0 - 15.0 g/dL    Comment: REPEATED TO VERIFY   HCT 30.2 (L) 36.0 - 46.0 %   MCV 95.6 80.0 - 100.0 fL   MCH 30.7 26.0 - 34.0 pg   MCHC 32.1 30.0 - 36.0 g/dL   RDW 17.6 (H) 11.5 - 15.5 %   Platelets 156 150 - 400 K/uL   nRBC 0.0 0.0 - 0.2 %    Comment: Performed at [facility], [place] [address] [place], [place] [address]  Ammonia     Status: Abnormal   Collection Time: 03/18/19  4:01 AM  Result Value Ref Range   Ammonia 52 (H) 9 - 35 umol/L    Comment: Performed at [facility], [place] [address] [place], [place] [address]  Brain natriuretic peptide     Status: Abnormal   Collection Time: 03/18/19  4:01 AM  Result Value Ref Range   B Natriuretic Peptide >4,500.0 (H) 0.0 - 100.0 pg/mL    Comment: Performed at [facility] [address], [place], [place] [address]   Glucose, capillary     Status: None   Collection Time: 03/18/19  6:52 AM  Result Value Ref Range   Glucose-Capillary 71 70 - 99 mg/dL   ROS: se e hpi    Physical Exam: Vitals:   03/18/19 0207 03/18/19 0900  BP:  120/74  Pulse:  65  Resp:  18  Temp:  98.7 F (37.1 C)  SpO2: 100% 98%     General: thin chronically ill AA female cachetic appearance  , soft spoken ,  ,NAD  HEENT:  [place] ,  [place],  MMdry  ,not icteric  Neck: supple no bruits  Heart: RRR 2/6 sem  ,no rub  Lungs: Unlabored , few bibasilar rales  Abdomen: BS pos , soft, NT, ND  Extremities: no pedal edema  Skin: no pedal ulcers or overt rash Neuro: awake and moves extrem to request but slow movements  Dialysis Access: LUA AVF  Pos bruit   Dialysis Orders: [facility]  on M[facility]. EDW 64kg HD [place] 2k, 2ca  Time 4hr Heparin None . Access LUA AVF      Calcitriol 2 mcg po q hd NO ESA with Ling CA     Assessment/Plan 1. Encephalopathy = Multiple etiologies missed HD , ploy pharmacy (Compazine ,Phenergan ) 2. ESRD - HD MWF  Next hd tomor  k 4.2  BUN 29 cr 5.33 3. Hypertension/volume  - was hypoxic on rm air  Now [place] o2 ok  Chest with  Vas congestion / edema ? Superimposed PNA  - no acute hd need today  Will prob need lower edw at dc  4. Stage IIIb/IV adenocarcinoma of right lung= per admit  Notes= originally diagnosed in 01/2018, subsequently lost to follow-up, followed up in September, since then has seen Dr. Earlie Server and Dr. Sondra Come, was set up to receive concurrent chemo radiation, has not been able to follow-up for this yet, underwent simulation recenty". Palliative CARE  Now seeing  For [place] 5. Anemia  - HGB 9.7  No ESA 2/2 CA  6. Metabolic bone disease -  PO vit d on hd  Fu labs  7. COPD/ Tobacco abuse- per admit  8. Severe protein  Malnutrition -  protein  supplement as tolerate / renal vitamin   Ernest Haber, PA-C [place] [phone] 03/18/2019, 9:50 AM

## 2019-03-18 NOTE — Evaluation (Signed)
Physical Therapy Evaluation Patient Details Name: Jessica Clarke MRN: [id number] DOB: [birth year] Today's Date: 03/18/2019   History of Present Illness  Jessica Clarke is a 69 y.o. female with medical history significant of CAD s/p NSTEMI, combined CHF, HTN, adenocarcinoma of the right lung with planned chemotherapy and radiation, hx of flash pulmonary edema, ESRD M/W/F who presents for concerns of headache and altered mental status.     Clinical Impression  Pt admitted with above. Pt presenting with fatigue and lethargy as well a confusion. Spoke with daughter [name] as pt poor historian. Pt has been physically and cognitively declining over the last few weeks. Pt was functioning at minA level but has had a fall. Dtr is available 24/7. Pt safe to d/c home with HHPT, use of rolling walker, and minA provided for ADLs once medically stable. Acute PT to cont to follow.    Follow Up Recommendations Home health PT;Supervision/Assistance - 24 hour    Equipment Recommendations  Rolling walker with 5" wheels;Wheelchair (transport) (measurements PT)(dtr reports she was suppose to received a rollator)    Recommendations for Other Services       Precautions / Restrictions Precautions Precautions: Fall Precaution Comments: AMS      Mobility  Bed Mobility Overal bed mobility: Needs Assistance Bed Mobility: Supine to Sit     Supine to sit: Min assist     General bed mobility comments: max verbal and tactile cues to initiate transfer, increased time, slow to respond and sequence  Transfers Overall transfer level: Needs assistance Equipment used: 1 person hand held assist Transfers: Sit to/from [facility] Sit to Stand: Mod assist Stand pivot transfers: Mod assist       General transfer comment: modA to power up and steady, pt slow to take steps to chair, dependent on PT, very unsteady, maxA to control descent into chair  Ambulation/Gait             General Gait  Details: deferred due to lethargy and instability, didn't feel comfortable with one person assist  Stairs            Wheelchair Mobility    Modified Rankin (Stroke Patients Only)       Balance Overall balance assessment: Needs assistance Sitting-balance support: Feet supported;No upper extremity supported Sitting balance-Leahy Scale: Fair     Standing balance support: Single extremity supported;During functional activity Standing balance-Leahy Scale: Poor Standing balance comment: dependent on PT to maintain standing                             Pertinent Vitals/Pain Pain Assessment: Faces Pain Score: 6  Pain Location: headache, neck Pain Descriptors / Indicators: Headache    Home Living Family/patient expects to be discharged to:: Private residence Living Arrangements: Children Available Help at Discharge: Family;Available 24 hours/day Type of Home: House Home Access: Stairs to enter Entrance Stairs-Rails: Psychiatric nurse of Steps: 2-3 Home Layout: One level Home Equipment: Bedside commode      Prior Function Level of Independence: Needs assistance   Gait / Transfers Assistance Needed: furniture walking, was suppose to have received rollator 2-3 mo ago but haven't received  ADL's / Homemaking Assistance Needed: assist with IADls, supervision for ADLs        Hand Dominance   Dominant Hand: Right    Extremity/Trunk Assessment   Upper Extremity Assessment Upper Extremity Assessment: Generalized weakness    Lower Extremity Assessment Lower Extremity Assessment: Generalized weakness  Cervical / Trunk Assessment Cervical / Trunk Assessment: Kyphotic  Communication   Communication: HOH  Cognition Arousal/Alertness: Lethargic Behavior During Therapy: Flat affect Overall Cognitive Status: Impaired/Different from baseline Area of Impairment: Orientation;Attention;Memory;Following  commands;Safety/judgement;Awareness;Problem solving                 Orientation Level: Disoriented to;Place;Time;Situation Current Attention Level: Focused   Following Commands: Follows one step commands inconsistently;Follows one step commands with increased time Safety/Judgement: Decreased awareness of deficits;Decreased awareness of safety Awareness: Intellectual Problem Solving: Slow processing;Decreased initiation;Difficulty sequencing;Requires verbal cues;Requires tactile cues General Comments: pt very slow to respond, requires verbal tactile cues to initiate mobility,       General Comments General comments (skin integrity, edema, etc.): skin discoloration    Exercises     Assessment/Plan    PT Assessment Patient needs continued PT services  PT Problem List Decreased strength;Decreased range of motion;Decreased activity tolerance;Decreased balance;Decreased mobility;Decreased coordination;Decreased cognition;Decreased knowledge of use of DME;Decreased safety awareness;Pain       PT Treatment Interventions DME instruction;Gait training;Stair training;Therapeutic activities;Functional mobility training;Therapeutic exercise;Balance training;Neuromuscular re-education;Cognitive remediation;Patient/family education    PT Goals (Current goals can be found in the Care Plan section)  Acute Rehab PT Goals PT Goal Formulation: With patient Time For Goal Achievement: 04/01/19 Potential to Achieve Goals: Good    Frequency Min 3X/week   Barriers to discharge        Co-evaluation               AM-PAC PT "6 Clicks" Mobility  Outcome Measure Help needed turning from your back to your side while in a flat bed without using bedrails?: A Little Help needed moving from lying on your back to sitting on the side of a flat bed without using bedrails?: A Little Help needed moving to and from a bed to a chair (including a wheelchair)?: A Lot Help needed standing up from a  chair using your arms (e.g., wheelchair or bedside chair)?: A Lot Help needed to walk in hospital room?: A Lot Help needed climbing 3-5 steps with a railing? : Total 6 Click Score: 13    End of Session Equipment Utilized During Treatment: Gait belt Activity Tolerance: Patient limited by fatigue;Patient limited by lethargy Patient left: in chair;with call bell/phone within reach;with chair alarm set Nurse Communication: Mobility status PT Visit Diagnosis: Unsteadiness on feet (R26.81);Muscle weakness (generalized) (M62.81);Difficulty in walking, not elsewhere classified (R26.2)    Time: 2229-7989 PT Time Calculation (min) (ACUTE ONLY): 21 min   Charges:   PT Evaluation $PT Eval Moderate Complexity: 1 Mod          Kittie Plater, PT, DPT Acute Rehabilitation Services Pager #: [phone] Office #: [phone]   Berline Lopes 03/18/2019, 1:31 PM

## 2019-03-18 NOTE — Procedures (Addendum)
Patient Name: Jessica Clarke  MRN: [id number]  Epilepsy Attending: Lora Havens  Referring Physician/Provider: Dr Ileene Musa Date: 03/18/2019 Duration: 25.02 mins  Patient history: 69yo F who presented with AMS. EEG to assess for seizure.   Level of alertness: awake  AEDs during EEG study: None  Technical aspects: This EEG study was done with scalp electrodes positioned according to the 10-20 International system of electrode placement. Electrical activity was acquired at a sampling rate of 500Hz  and reviewed with a high frequency filter of 70Hz  and a low frequency filter of 1Hz . EEG data were recorded continuously and digitally stored.   DESCRIPTION: During awake state, no clear posterior dominant rhythm was noted. EEG showed continuous generalized 5-7hz  theta slowing as well as intermittent 3 to 4 Hz generalized delta slowing.  Physiologic photic driving was not seen during photic stimulation.  Hyperventilation was not performed due to SOB.  ABNORMALITY - Continuous slow, generalized   IMPRESSION: This study is suggestive of mild to moderate diffuse encephalopathy, nonspecific to etiology. No seizures or epileptiform discharges were seen throughout the recording.  [name] [name]

## 2019-03-18 NOTE — H&P (Signed)
History and Physical    Jessica Clarke WNU:[id number] DOB: 1950/09/13 DOA: 03-22-2019  PCP: Arnette Felts, FNP  Patient coming from: Home, lives with daughter  I have personally briefly reviewed patient's old medical records in [facility]  Chief Complaint: headache, altered mental status   HPI: Jessica Clarke is a 69 y.o. female with medical history significant of CAD s/p NSTEMI, combined CHF, HTN, adenocarcinoma of the right lung with planned chemotherapy and radiation, hx of flash pulmonary edema, ESRD M/W/F who presents for concerns of headache and altered mental status.   History was obtained in its entirety from daughter over the phone as patient was only able to state her name but unable to provide any other history.  Daughter reports that she fell out of bed yesterday around 4 AM and had vomited and soiled herself.  She thinks that perhaps patient was trying to get out of bed to use the bathroom but tripped on her sheets.  She then called EMS and patient had workup including a negative CT head and CT cervical spine. Initial concerns of possible seizure with postictal state due to possible AMS- CK and lactic acid were obtained by ED physician to evaluate for this-which returned at 44 and lactic acid of 3 . CXR consistent with fluid overload but she had no oxygen requirement. She was discharged home and advised to go to dialysis.  Pt then presented to dialysis around 1030 and then about 1430 to 1500 patient complaint of a headache and seemed disoriented so daughter asked her to be transported directly from dialysis to the ER for further evaluation.  On arrival, she was afebrile and normotensive.  However she had oxygen desaturation requiring up to 2.5 L.  Patient was mostly nonverbal and not able to provide much history.  Per daughter, her mother at baseline is able to carry a conversation and can do most of her ADLs although recently this has been more difficult due to her illness and  they have had to hire an aide in the house. Patient was also advised by her primary care physician to obtain home oxygen months ago but she declined.  Daughter herself wears oxygen and has been giving it to her mother and states that it helps with her persistent shortness of breath.  Patient has been missing dialysis at least once per week due to unwillingness to go.  She has also been sick each time that she had an appointment for either radiation or chemotherapy for lung cancer.  Daughter is unsure if patient is just "giving up."  She thinks her altered mentation could be due to depression.  She also endorsed patient having chronic diarrhea several times a day with black stools.  Also complains of frequent abdominal pain.  However no changes in her appetite.  Continues to use tobacco.  Denies any illicit drug or alcohol use.  Daughter herself has a history of epilepsy that she thinks is congenital.   Review of Systems: Unable to obtain given patient's altered mental status  Past Medical History:  Diagnosis Date  . Abnormal Pap smear    ASCUS ?HGSIL  . Anemia   . Arthritis   . Cancer (HCC)    lung ( per daughter)  . Cervical dysplasia   . CHF (congestive heart failure) (HCC)   . Chronic kidney disease   . Constipation   . Constipation   . Dyspnea   . GERD (gastroesophageal reflux disease)   . Gout   . Hyperlipidemia   .  Hypertension   . Myocardial infarction (HCC) 2015  . Obesity   . Pneumonia   . Uterine polyp     Past Surgical History:  Procedure Laterality Date  . AV FISTULA PLACEMENT  01/18/2012   Procedure: ARTERIOVENOUS (AV) FISTULA CREATION;  Surgeon: Chuck Hint, MD;  Location: [facility] OR;  Service: Vascular;  Laterality: Left;  . BIOPSY  10/20/2018   Procedure: BIOPSY;  Surgeon: Tressia Danas, MD;  Location: [facility] ENDOSCOPY;  Service: Gastroenterology;;  . COLONOSCOPY WITH PROPOFOL N/A 10/20/2018   Procedure: COLONOSCOPY WITH PROPOFOL;  Surgeon: Tressia Danas, MD;  Location: [facility] ENDOSCOPY;  Service: Gastroenterology;  Laterality: N/A;  . DILATION AND CURETTAGE OF UTERUS    . HYSTEROSCOPY    . LEEP    . LEFT HEART CATHETERIZATION WITH CORONARY ANGIOGRAM N/A 08/26/2013   Procedure: LEFT HEART CATHETERIZATION WITH CORONARY ANGIOGRAM;  Surgeon: Marykay Lex, MD;  Location: [facility] CATH LAB;  Service: Cardiovascular;  Laterality: N/A;  . POLYPECTOMY  10/20/2018   Procedure: POLYPECTOMY;  Surgeon: Tressia Danas, MD;  Location: [facility] ENDOSCOPY;  Service: Gastroenterology;;  . REVISON OF ARTERIOVENOUS FISTULA Left 03/14/2016   Procedure: PLICATION OF ARTERIOVENOUS FISTULA;  Surgeon: Fransisco Hertz, MD;  Location: [facility] OR;  Service: Vascular;  Laterality: Left;  Marland Kitchen VIDEO BRONCHOSCOPY WITH ENDOBRONCHIAL ULTRASOUND N/A 12/19/2018   Procedure: VIDEO BRONCHOSCOPY WITH ENDOBRONCHIAL ULTRASOUND;  Surgeon: Josephine Igo, DO;  Location: [facility] OR;  Service: Thoracic;  Laterality: N/A;     reports that she has been smoking cigarettes. She has a 7.50 pack-year smoking history. She has never used smokeless tobacco. She reports previous alcohol use. She reports that she does not use drugs.  No Known Allergies  Family History  Problem Relation Age of Onset  . Esophageal cancer Other   . Pancreatic cancer Other   . Heart disease Mother   . Hypertension Mother   . Breast cancer Sister   . Esophageal cancer Sister   . Cancer Sister   . Deep vein thrombosis Sister   . Diabetes Sister   . Hypertension Sister   . Hypertension Daughter   . Colon cancer Neg Hx      Prior to Admission medications   Medication Sig Start Date End Date Taking? Authorizing Provider  acetaminophen (TYLENOL) 325 MG tablet Take 325-650 mg by mouth every 8 (eight) hours as needed for mild pain.     [provider]  allopurinol (ZYLOPRIM) 100 MG tablet Take 100 mg by mouth daily.      [provider]  Amino Acids-Protein Hydrolys (FEEDING SUPPLEMENT, PRO-STAT SUGAR FREE 64,)  LIQD Take 30 mLs by mouth 2 (two) times daily. 10/26/18   Kathlen Mody, MD  atorvastatin (LIPITOR) 10 MG tablet Take 10 mg by mouth at bedtime.  01/30/16   [provider]  B Complex-Folic Acid (B COMPLEX FORMULA 1) TABS Take 1 tablet by mouth daily.  07/11/17   [provider]  calcitRIOL (ROCALTROL) 0.5 MCG capsule Take 2 capsules (1 mcg total) by mouth every Monday, Wednesday, and Friday with hemodialysis. 10/28/18   Kathlen Mody, MD  cinacalcet (SENSIPAR) 30 MG tablet Take 1 tablet (30 mg total) by mouth daily with supper. 10/26/18   Kathlen Mody, MD  colchicine 0.6 MG tablet Take 0.6 mg by mouth daily as needed (as directed for gout flares or pain).  08/14/18   [provider]  Darbepoetin Alfa (ARANESP) 60 MCG/0.3ML SOSY injection Inject 0.3 mLs (60 mcg total) into the vein every  Friday with hemodialysis. 11/01/18   Kathlen Mody, MD  loperamide (IMODIUM) 2 MG capsule Take 2 mg by mouth daily as needed for diarrhea or loose stools.  06/24/18 06/23/19  [provider]  midodrine (PROAMATINE) 10 MG tablet Take 1 tablet (10 mg total) by mouth 2 (two) times daily with a meal. 10/26/18   Kathlen Mody, MD  multivitamin (RENA-VIT) TABS tablet Take 1 tablet by mouth at bedtime. 10/26/18   Kathlen Mody, MD  prochlorperazine (COMPAZINE) 10 MG tablet Take 1 tablet (10 mg total) by mouth every 6 (six) hours as needed for nausea or vomiting. 02/06/19   Heilingoetter, Cassandra L, PA-C  promethazine (PHENERGAN) 25 MG suppository Place 1 suppository (25 mg total) rectally every 6 (six) hours as needed for nausea or vomiting. 03/28/2019   Dione Booze, MD  promethazine (PHENERGAN) 25 MG tablet Take 1 tablet (25 mg total) by mouth every 6 (six) hours as needed for nausea or vomiting. 03/24/2019   Dione Booze, MD  thiamine (VITAMIN B-1) 100 MG tablet Take 100 mg by mouth daily.    [provider]  VELPHORO 500 MG chewable tablet Chew 1,000 mg by mouth 3 (three) times daily with  meals.  06/30/16   [provider]  Vitamin D, Cholecalciferol, 25 MCG (1000 UT) TABS Take 1,000 Units by mouth daily.  08/12/18 08/11/19  [provider]    Physical Exam: Vitals:   21-Mar-2019 2130 Mar 21, 2019 2300 03/18/19 0000 03/18/19 0100  BP:  118/70 125/76 120/69  Pulse: 71 65    Resp: 17 17 19  (!) 21  SpO2: 100% 100%      Constitutional: NAD, calm, comfortable, nontoxic appearing female laying flat in bed Vitals:   04/09/2019 2130 March 21, 2019 2300 03/18/19 0000 03/18/19 0100  BP:  118/70 125/76 120/69  Pulse: 71 65    Resp: 17 17 19  (!) 21  SpO2: 100% 100%     Eyes: PERRL, lids and conjunctivae normal with some tearing ENMT: Mucous membranes are moist.  Neck: normal, supple Respiratory: Bibasilar crackles with no wheezing. Normal respiratory effort on 2.5 L. No accessory muscle use.  Cardiovascular: Regular rate and rhythm, no murmurs / rubs / gallops. No extremity edema.  Abdomen: no tenderness, no masses palpated.  Bowel sounds positive.  Musculoskeletal: no clubbing / cyanosis. No joint deformity upper and lower extremities. Good ROM, no contractures. Normal muscle tone.  Skin: no rashes, lesions, ulcers. No induration Neurologic: Patient is alert but only oriented to self.  She was only able to answer her name but could not answer any other questions.  Able to follow commands with weak hand grasp.  Unable to follow any other commands.  Patient laying in bed and stare off into the distance. Psychiatric: Patient with flat affect and staring into the distance.   Labs on Admission: I have personally reviewed following labs and imaging studies  CBC: Recent Labs  Lab 03/11/19 1027 Mar 21, 2019 0402 03/27/2019 1922  WBC 3.9* 9.4 7.8  NEUTROABS 3.2 7.8* 6.6  HGB 10.3* 10.0* 12.8  HCT 32.2* 31.0* 40.3  MCV 97.0 96.3 96.0  PLT 145* 183 PLATELET CLUMPS NOTED ON SMEAR, UNABLE TO ESTIMATE   Basic Metabolic Panel: Recent Labs  Lab 03/11/19 1027 04/05/2019 0402  21-Mar-2019 1922  NA 133* 133* 135  K 3.9 3.5 3.3*  CL 90* 90* 92*  CO2 28 20* 28  GLUCOSE 69* 128* 94  BUN 25* 43* 23  CREATININE 3.95* 7.18* 4.94*  CALCIUM 9.4 9.0 9.1  MG  --  2.0  --    GFR: Estimated Creatinine Clearance: 11.2 mL/min (A) (by C-G formula based on SCr of 4.94 mg/dL (H)). Liver Function Tests: Recent Labs  Lab 03/11/19 1027 03/25/2019 0402 03/26/2019 1922  AST 18 21 18   ALT 8 12 13   ALKPHOS 166* 140* 129*  BILITOT 1.0 1.4* 0.8  PROT 8.5* 7.8 7.4  ALBUMIN 3.0* 2.7* 2.6*   Recent Labs  Lab 04/10/2019 1922  LIPASE 17   No results for input(s): AMMONIA in the last 168 hours. Coagulation Profile: No results for input(s): INR, PROTIME in the last 168 hours. Cardiac Enzymes: Recent Labs  Lab 04/02/2019 0402  CKTOTAL 40   BNP (last 3 results) No results for input(s): PROBNP in the last 8760 hours. HbA1C: No results for input(s): HGBA1C in the last 72 hours. CBG: No results for input(s): GLUCAP in the last 168 hours. Lipid Profile: No results for input(s): CHOL, HDL, LDLCALC, TRIG, CHOLHDL, LDLDIRECT in the last 72 hours. Thyroid Function Tests: No results for input(s): TSH, T4TOTAL, FREET4, T3FREE, THYROIDAB in the last 72 hours. Anemia Panel: No results for input(s): VITAMINB12, FOLATE, FERRITIN, TIBC, IRON, RETICCTPCT in the last 72 hours. Urine analysis:    Component Value Date/Time   COLORURINE YELLOW 08/27/2013 0546   APPEARANCEUR CLOUDY (A) 08/27/2013 0546   LABSPEC 1.026 08/27/2013 0546   PHURINE 7.5 08/27/2013 0546   GLUCOSEU NEGATIVE 08/27/2013 0546   HGBUR TRACE (A) 08/27/2013 0546   BILIRUBINUR SMALL (A) 08/27/2013 0546   KETONESUR NEGATIVE 08/27/2013 0546   PROTEINUR >300 (A) 08/27/2013 0546   UROBILINOGEN 0.2 08/27/2013 0546   NITRITE NEGATIVE 08/27/2013 0546   LEUKOCYTESUR NEGATIVE 08/27/2013 0546    Radiological Exams on Admission: DG Chest 1 View  Result Date: 04/11/2019 CLINICAL DATA:  Initial evaluation for acute altered mental  status, unwitnessed fall. EXAM: CHEST  1 VIEW COMPARISON:  Prior radiograph from 02/05/2019. FINDINGS: Moderate cardiomegaly, stable. Mediastinal silhouette within normal limits. Aortic atherosclerosis. Lungs hypoinflated. Diffuse pulmonary vascular congestion with interstitial prominence, compatible with moderate pulmonary interstitial edema. No definite pleural effusion. Superimposed infection would be difficult to exclude, and could be considered in the correct clinical setting. No pneumothorax. No acute osseous finding. IMPRESSION: 1. Cardiomegaly with moderate diffuse pulmonary interstitial edema. Superimposed infection would be difficult to exclude, and could be considered in the correct clinical setting. 2.  Aortic Atherosclerosis (ICD10-I70.0). Electronically Signed   By: Rise Mu M.D.   On: 04/13/2019 04:43   CT Head Wo Contrast  Result Date: 04/04/2019 CLINICAL DATA:  Unwitnessed fall with head trauma EXAM: CT HEAD WITHOUT CONTRAST CT CERVICAL SPINE WITHOUT CONTRAST TECHNIQUE: Multidetector CT imaging of the head and cervical spine was performed following the standard protocol without intravenous contrast. Multiplanar CT image reconstructions of the cervical spine were also generated. COMPARISON:  None. FINDINGS: CT HEAD FINDINGS Brain: No evidence of acute infarction, hemorrhage, hydrocephalus, extra-axial collection or mass lesion/mass effect. Small remote appearing right cerebellar infarct. Age congruent cerebral volume loss. Vascular: No hyperdense vessel or unexpected calcification. Skull: Normal. Negative for fracture or focal lesion. Sinuses/Orbits: No evidence of injury. CT CERVICAL SPINE FINDINGS Alignment: No traumatic malalignment. There is C4-5 anterolisthesis associated with advanced facet arthropathy. Skull base and vertebrae: Negative for acute fracture or aggressive bone lesion. There is limitation by motion artifact Soft tissues and spinal canal: No prevertebral fluid or  swelling. No visible canal hematoma. Disc levels: C4-5 and C5-6 disc narrowing and endplate degeneration. Facet osteoarthritis most notable on the left at C4-5  where there is calcified intra and periarticular mass, likely gouty. The associated facet is eroded. This calcified mass encroaches on the left foramen. Upper chest: Apical emphysematous changes Other: Scanogram there is cardiomegaly and interstitial coarsening. Chest x-ray is pending. Motion artifact, but overall diagnostic. IMPRESSION: 1. No evidence of acute intracranial or cervical spine injury. 2. Calcified articular and periarticular mass on the left at C4-5 with erosive arthropathy, a gouty appearance. Electronically Signed   By: Marnee Spring M.D.   On: Mar 29, 2019 04:45   CT Angio Chest PE W/Cm &/Or Wo Cm  Result Date: March 29, 2019 CLINICAL DATA:  Hypoxia and left lower quadrant pain, initial encounter EXAM: CT ANGIOGRAPHY CHEST CT ABDOMEN AND PELVIS WITH CONTRAST TECHNIQUE: Multidetector CT imaging of the chest was performed using the standard protocol during bolus administration of intravenous contrast. Multiplanar CT image reconstructions and MIPs were obtained to evaluate the vascular anatomy. Multidetector CT imaging of the abdomen and pelvis was performed using the standard protocol during bolus administration of intravenous contrast. CONTRAST:  80mL OMNIPAQUE IOHEXOL 300 MG/ML  SOLN COMPARISON:  02/05/2019 CT of the abdomen, 01/16/2019 PET-CT FINDINGS: CTA CHEST FINDINGS Cardiovascular: Thoracic aorta demonstrates atherosclerotic calcifications without aneurysmal dilatation or dissection. Coronary calcifications are seen. Mild cardiac enlargement is noted. The right atrium is enlarged and there is significant reflux of contrast material into the IVC and hepatic veins likely related to increased central venous pressure. The pulmonary artery shows a normal branching pattern. No focal filling defects are identified to suggest pulmonary emboli.  Mediastinum/Nodes: Thoracic inlet is within normal limits. Stable adenopathy anterior to the right mainstem bronchus is seen similar to that noted on prior PET-CT. Scattered AP window lymph nodes are again seen and stable. Lungs/Pleura: 16 mm spiculated lesion is noted in the right upper lobe previously noted to be hypermetabolic on PET-CT consistent with the given clinical history of lung carcinoma. Diffuse emphysematous changes are noted. No other nodules are seen. A right-sided pleural effusion is noted which has increased in the interval from the prior exam. Emphysematous changes are noted on the left without focal nodules. Musculoskeletal: Degenerative changes of the thoracic spine are noted. No acute rib abnormality is seen. No metastatic lesions are noted. Review of the MIP images confirms the above findings. CT ABDOMEN and PELVIS FINDINGS Hepatobiliary: No focal liver abnormality is seen. No gallstones, gallbladder wall thickening, or biliary dilatation. Pancreas: Unremarkable. No pancreatic ductal dilatation or surrounding inflammatory changes. Spleen: Normal in size without focal abnormality. Adrenals/Urinary Tract: Adrenal glands are stable in appearance from the recent PET-CT. No significant increased activity is noted. Kidneys demonstrate cortical thinning with cystic change stable from prior CT examinations. The bladder is decompressed. Stomach/Bowel: Diverticular change of the colon is noted without evidence of diverticulitis. No obstructive or inflammatory changes of the large or small bowel are seen. The stomach is within normal limits. Vascular/Lymphatic: Aortic atherosclerosis. No enlarged abdominal or pelvic lymph nodes. Reproductive: Uterus and bilateral adnexa are unremarkable. Other: Mild ascites is noted similar to that seen on prior PET-CT. Changes of anasarca are again noted. Musculoskeletal: Degenerative changes of the lumbar spine are seen. No acute bony abnormality is noted. Review of  the MIP images confirms the above findings. IMPRESSION: CTA of the chest: No evidence of pulmonary emboli. Changes suggestive of increased central venous pressure Right upper lobe spiculated mass with associated lymphadenopathy consistent with the known history of lung carcinoma. This is stable from the prior exam. CT of the abdomen and pelvis: Diverticulosis without diverticulitis. Stable  renal atrophy. Ascites with changes of anasarca. Electronically Signed   By: Alcide Clever M.D.   On: April 05, 2019 22:13   CT Cervical Spine Wo Contrast  Result Date: 04/05/2019 CLINICAL DATA:  Unwitnessed fall with head trauma EXAM: CT HEAD WITHOUT CONTRAST CT CERVICAL SPINE WITHOUT CONTRAST TECHNIQUE: Multidetector CT imaging of the head and cervical spine was performed following the standard protocol without intravenous contrast. Multiplanar CT image reconstructions of the cervical spine were also generated. COMPARISON:  None. FINDINGS: CT HEAD FINDINGS Brain: No evidence of acute infarction, hemorrhage, hydrocephalus, extra-axial collection or mass lesion/mass effect. Small remote appearing right cerebellar infarct. Age congruent cerebral volume loss. Vascular: No hyperdense vessel or unexpected calcification. Skull: Normal. Negative for fracture or focal lesion. Sinuses/Orbits: No evidence of injury. CT CERVICAL SPINE FINDINGS Alignment: No traumatic malalignment. There is C4-5 anterolisthesis associated with advanced facet arthropathy. Skull base and vertebrae: Negative for acute fracture or aggressive bone lesion. There is limitation by motion artifact Soft tissues and spinal canal: No prevertebral fluid or swelling. No visible canal hematoma. Disc levels: C4-5 and C5-6 disc narrowing and endplate degeneration. Facet osteoarthritis most notable on the left at C4-5 where there is calcified intra and periarticular mass, likely gouty. The associated facet is eroded. This calcified mass encroaches on the left foramen. Upper  chest: Apical emphysematous changes Other: Scanogram there is cardiomegaly and interstitial coarsening. Chest x-ray is pending. Motion artifact, but overall diagnostic. IMPRESSION: 1. No evidence of acute intracranial or cervical spine injury. 2. Calcified articular and periarticular mass on the left at C4-5 with erosive arthropathy, a gouty appearance. Electronically Signed   By: Marnee Spring M.D.   On: 05-Apr-2019 04:45   CT Abdomen Pelvis W Contrast  Result Date: Apr 05, 2019 CLINICAL DATA:  Hypoxia and left lower quadrant pain, initial encounter EXAM: CT ANGIOGRAPHY CHEST CT ABDOMEN AND PELVIS WITH CONTRAST TECHNIQUE: Multidetector CT imaging of the chest was performed using the standard protocol during bolus administration of intravenous contrast. Multiplanar CT image reconstructions and MIPs were obtained to evaluate the vascular anatomy. Multidetector CT imaging of the abdomen and pelvis was performed using the standard protocol during bolus administration of intravenous contrast. CONTRAST:  80mL OMNIPAQUE IOHEXOL 300 MG/ML  SOLN COMPARISON:  02/05/2019 CT of the abdomen, 01/16/2019 PET-CT FINDINGS: CTA CHEST FINDINGS Cardiovascular: Thoracic aorta demonstrates atherosclerotic calcifications without aneurysmal dilatation or dissection. Coronary calcifications are seen. Mild cardiac enlargement is noted. The right atrium is enlarged and there is significant reflux of contrast material into the IVC and hepatic veins likely related to increased central venous pressure. The pulmonary artery shows a normal branching pattern. No focal filling defects are identified to suggest pulmonary emboli. Mediastinum/Nodes: Thoracic inlet is within normal limits. Stable adenopathy anterior to the right mainstem bronchus is seen similar to that noted on prior PET-CT. Scattered AP window lymph nodes are again seen and stable. Lungs/Pleura: 16 mm spiculated lesion is noted in the right upper lobe previously noted to be  hypermetabolic on PET-CT consistent with the given clinical history of lung carcinoma. Diffuse emphysematous changes are noted. No other nodules are seen. A right-sided pleural effusion is noted which has increased in the interval from the prior exam. Emphysematous changes are noted on the left without focal nodules. Musculoskeletal: Degenerative changes of the thoracic spine are noted. No acute rib abnormality is seen. No metastatic lesions are noted. Review of the MIP images confirms the above findings. CT ABDOMEN and PELVIS FINDINGS Hepatobiliary: No focal liver abnormality is seen. No gallstones,  gallbladder wall thickening, or biliary dilatation. Pancreas: Unremarkable. No pancreatic ductal dilatation or surrounding inflammatory changes. Spleen: Normal in size without focal abnormality. Adrenals/Urinary Tract: Adrenal glands are stable in appearance from the recent PET-CT. No significant increased activity is noted. Kidneys demonstrate cortical thinning with cystic change stable from prior CT examinations. The bladder is decompressed. Stomach/Bowel: Diverticular change of the colon is noted without evidence of diverticulitis. No obstructive or inflammatory changes of the large or small bowel are seen. The stomach is within normal limits. Vascular/Lymphatic: Aortic atherosclerosis. No enlarged abdominal or pelvic lymph nodes. Reproductive: Uterus and bilateral adnexa are unremarkable. Other: Mild ascites is noted similar to that seen on prior PET-CT. Changes of anasarca are again noted. Musculoskeletal: Degenerative changes of the lumbar spine are seen. No acute bony abnormality is noted. Review of the MIP images confirms the above findings. IMPRESSION: CTA of the chest: No evidence of pulmonary emboli. Changes suggestive of increased central venous pressure Right upper lobe spiculated mass with associated lymphadenopathy consistent with the known history of lung carcinoma. This is stable from the prior exam. CT  of the abdomen and pelvis: Diverticulosis without diverticulitis. Stable renal atrophy. Ascites with changes of anasarca. Electronically Signed   By: Alcide Clever M.D.   On: 2019-04-15 22:13   DG Chest Port 1 View  Result Date: 04-15-2019 CLINICAL DATA:  69 year old female with hypoxia and fatigue. EXAM: PORTABLE CHEST 1 VIEW COMPARISON:  Chest radiograph dated 04/15/19. FINDINGS: There is cardiomegaly with vascular congestion and edema. Superimposed pneumonia is not excluded. Clinical correlation is recommended. No large pleural effusion. There is no pneumothorax. Atherosclerotic calcification of the aorta. No acute osseous pathology. IMPRESSION: Cardiomegaly with findings of CHF relatively similar to the earlier exam. Clinical correlation and follow-up recommended. Electronically Signed   By: Elgie Collard M.D.   On: 04-15-2019 18:03    EKG: Independently reviewed.   Assessment/Plan  Acute hypoxic respiratory failure secondary to pulmonary edema/CHF vs lung cancer on 2.5L -reportedly per daughter, her PCP had recommended patient get home oxygen in the past althought I could not find documentation of this. Has received dialysis today but did not appear to have finished the full session due to headache  No urgent dialysis need  No leukocytosis or fever to suggest infection Check BNP. Consider repeat Echo to evaluate for worsening heart failure. Continue to monitor and maintain O2 >92% perhaps patient has chronic hypoxia?  Acute encephalopathy Patient had negative CT head.   Creatinine at baseline of 4 and she did go to dialysis today. No leukocytosis.  No finding on CT abdomen and pelvis. Will check ammonia level.  Will check EEG- daughter endorsed that she herself has epilepsy althought unsure if patient has any hx of seizure -Unclear if this is metabolic or psychological.  Daughter endorse that patient potentially could be depressed from her end-stage renal disease and recent  diagnosis of lung cancer. Daughter open to speaking to pallative care for GOC discussion in the morning  Thrombocytopenia CBC unable to quantify due to clumping.  However, CBC earlier this morning was in the 180s. Suspected could be lab error.  We will repeat stat.  No findings of petechiae on exam to suggest any active bleeding.  Chronic diarrhea will check C.diff althought pt was seen in Dec for this and was treated briefly with a course of antibiotics for enteritis  ESRD on HD M/W/F Will need to consult nephrology for HD   Non-small cell lung cancer Followed by oncology.  Patient  has missed all of her radiation and chemotherapy due to illness so far.  Hx of CAD s/p NSTEMI stable  Hypertension stable  Anemia of chronic renal disease Stable  Hyperlipidemia Resume statin pending med rec   DVT prophylaxis:.Lovenox Code Status: Full Family Communication: Plan discussed with daughter at length over the phone.  All questions and concerns were addressed.   Disposition Plan: Home with at least 2 midnight stays  Consults called:  Admission status: inpatient   Reema Chick T Nasrin Lanzo DO [facility]   If 7PM-7AM, please contact night-coverage [email or web address]   03/18/2019, 1:34 AM

## 2019-03-18 NOTE — ED Notes (Signed)
RN to RN report given to [place] with no questions or concerns.

## 2019-03-18 NOTE — ED Notes (Signed)
Admitting at bedside 

## 2019-03-19 ENCOUNTER — Ambulatory Visit: Payer: Medicare Other

## 2019-03-19 ENCOUNTER — Telehealth: Payer: Self-pay

## 2019-03-19 ENCOUNTER — Other Ambulatory Visit: Payer: Self-pay

## 2019-03-19 ENCOUNTER — Encounter (HOSPITAL_COMMUNITY): Payer: Self-pay | Admitting: Family Medicine

## 2019-03-19 DIAGNOSIS — Z66 Do not resuscitate: Secondary | ICD-10-CM

## 2019-03-19 LAB — CBC
HCT: 29.8 % — ABNORMAL LOW (ref 36.0–46.0)
Hemoglobin: 9.7 g/dL — ABNORMAL LOW (ref 12.0–15.0)
MCH: 31.1 pg (ref 26.0–34.0)
MCHC: 32.6 g/dL (ref 30.0–36.0)
MCV: 95.5 fL (ref 80.0–100.0)
Platelets: 180 10*3/uL (ref 150–400)
RBC: 3.12 MIL/uL — ABNORMAL LOW (ref 3.87–5.11)
RDW: 17.4 % — ABNORMAL HIGH (ref 11.5–15.5)
WBC: 8.6 10*3/uL (ref 4.0–10.5)
nRBC: 0 % (ref 0.0–0.2)

## 2019-03-19 LAB — COMPREHENSIVE METABOLIC PANEL
ALT: 8 U/L (ref 0–44)
AST: 19 U/L (ref 15–41)
Albumin: 2.6 g/dL — ABNORMAL LOW (ref 3.5–5.0)
Alkaline Phosphatase: 118 U/L (ref 38–126)
Anion gap: 21 — ABNORMAL HIGH (ref 5–15)
BUN: 39 mg/dL — ABNORMAL HIGH (ref 8–23)
CO2: 23 mmol/L (ref 22–32)
Calcium: 9.1 mg/dL (ref 8.9–10.3)
Chloride: 87 mmol/L — ABNORMAL LOW (ref 98–111)
Creatinine, Ser: 6.16 mg/dL — ABNORMAL HIGH (ref 0.44–1.00)
GFR calc Af Amer: 7 mL/min — ABNORMAL LOW (ref >60)
GFR calc non Af Amer: 6 mL/min — ABNORMAL LOW (ref >60)
Glucose, Bld: 76 mg/dL (ref 70–99)
Potassium: 4.3 mmol/L (ref 3.5–5.1)
Sodium: 131 mmol/L — ABNORMAL LOW (ref 135–145)
Total Bilirubin: 1.7 mg/dL — ABNORMAL HIGH (ref 0.3–1.2)
Total Protein: 7.1 g/dL (ref 6.5–8.1)

## 2019-03-19 LAB — C DIFFICILE QUICK SCREEN W PCR REFLEX
C Diff antigen: POSITIVE — AB
C Diff interpretation: DETECTED
C Diff toxin: POSITIVE — AB

## 2019-03-19 LAB — GLUCOSE, CAPILLARY: Glucose-Capillary: 74 mg/dL (ref 70–99)

## 2019-03-19 LAB — SARS CORONAVIRUS 2 (TAT 6-24 HRS): SARS Coronavirus 2: NEGATIVE

## 2019-03-19 LAB — HEPATITIS B SURFACE ANTIGEN: Hepatitis B Surface Ag: NONREACTIVE

## 2019-03-19 MED ORDER — VANCOMYCIN 50 MG/ML ORAL SOLUTION
125.0000 mg | Freq: Four times a day (QID) | ORAL | Status: DC
  Administered 2019-03-19 – 2019-03-21 (×8): 125 mg via ORAL
  Filled 2019-03-19 (×11): qty 2.5

## 2019-03-19 MED ORDER — CALCITRIOL 0.5 MCG PO CAPS
ORAL_CAPSULE | ORAL | Status: AC
  Filled 2019-03-19: qty 4

## 2019-03-19 NOTE — Progress Notes (Addendum)
PROGRESS NOTE    Jessica Clarke  ERD:[id number] DOB: [birth year] DOA: 03/29/2019 PCP: [name], FNP  Brief Narrative:Jessica Clarke is a 69 y.o. female with medical history significant of CAD s/p NSTEMI, combined CHF, HTN, stage IIIb/IV adenocarcinoma of right lung, originally diagnosed in 01/2018, lost to follow-up until 10/2018 , has missed several follow-up appointments with oncology and XRT , has not started chemo or radiation yet  -Was seen in the emergency room 2 days ago following a fall and 1-2 episodes of vomiting, she was discharged back home after negative work-up on Phenergan and Compazine  -She was sent to the emergency room 2/1 after dialysis secondary to some mental status changes, disorientation  she was also noted to be hypoxemic, upon arrival in the emergency room she was mostly nonverbal and unable to provide any history, per family she been missing dialysis due to feeling unwell, daughter reports she has been sick most of the time, unable to keep her chemo or radiation appointments   Assessment & Plan:  Encephalopathy -I suspect missed dialysis and polypharmacy likely contributing, she was just prescribed Phenergan and Compazine following her recent emergency room visit  -In addition tested positive for C. difficile now -CT head without acute findings, MRI brain in 10/2018 was negative for metastasis -Mental status improving, she is awake alert, oriented to self and place and partly to time this morning, she was able to tell me the year and month, president etc -EEG negative for epileptiform activities -Stopped sedating medications -PT OT  Fever 2/3 -Could be from C. difficile, also check blood cultures -and check Covid, had Poc which was negative 2/1  Stage IIIb/IV adenocarcinoma of right lung -This was originally diagnosed in 01/2018, subsequently lost to follow-up, followed up in September, since then has seen Dr. Earlie Server and Dr. Sondra Come, was set up to receive  concurrent chemo radiation, has not been able to follow-up for this yet, underwent simulation recently -Lymph nodes were positive for malignancy in November -Given her limited ability to make it for dialysis or oncology follow-ups I seriously doubt she would be able to tolerate any aggressive form of cancer treatment -Palliative medicine following, appreciate input  Volume overload -Mild hypoxemia with evidence of fluid overload on x-ray and ascites on CT abdomen -I think she needs ongoing dry weight reduction as tolerated  ESRD on hemodialysis Chronic hypotension -For dialysis today, blood pressure higher than baseline, likely from secondary to volume overloaded state -Midodrine resumed at lower dose -Poor compliance with dialysis, according to daughter misses at least one dialysis treatment week  Chronic diarrhea C. difficile colitis -Patient unable to accurately report how long she has had diarrhea but says she has had this off and on for a long time -Her C. difficile PCR and toxin is positive, continues to have loose stool here -Will start oral vancomycin for 10 to 14 days  Severe protein calorie malnutrition -Supplements as tolerated  Anemia of chronic disease -Baseline hemoglobin around 10, this is stable   DVT prophylaxis: Lovenox Code Status: Full code, recommended DNR to the patient, she seems to understand limited utility of CPR, mechanical ventilation, will need to discuss this further with daughter Family Communication: No family at bedside, called and updated daughter [name] yesterday Disposition Plan: Home in 1 to 2 days pending improvement in mentation, diarrhea/C. difficile colitis and further palliative discussions  Consultants:   Renal  Palliative   Procedures:   Antimicrobials:    Subjective: -No events overnight, mental status is improving, still  has mild periods of confusion, continues to have chronic diarrhea, no nausea and  vomiting  Objective: Vitals:   03/19/19 0946 03/19/19 0950 03/19/19 1000 03/19/19 1030  BP: (!) 145/94 (!) 154/92 (!) 154/112 (!) 184/86  Pulse: 61 91 74 91  Resp:      Temp:      TempSrc:      SpO2:      Weight:      Height:        Intake/Output Summary (Last 24 hours) at 03/19/2019 1149 Last data filed at 03/18/2019 1400 Gross per 24 hour  Intake 120 ml  Output --  Net 120 ml   Filed Weights   03/19/19 0101 03/19/19 0940  Weight: 67.2 kg 67.2 kg    Examination:  Gen: Elderly frail chronically ill female, cachectic, sitting up in bed, awake alert oriented to self, place partly to time, poor attention HEENT: Oral mucosa moist, pallor Lungs: Few basilar crackles  CVS: RRR,No Gallops,Rubs or new Murmurs Abd: soft, Non tender, non distended, BS present Extremities: No edema Skin: no new rashes Psychiatry: Mood & affect appropriate.     Data Reviewed:   CBC: Recent Labs  Lab 03/23/2019 0402 03/28/2019 1922 03/18/19 0401 03/19/19 0801  WBC 9.4 7.8 9.3 8.6  NEUTROABS 7.8* 6.6  --   --   HGB 10.0* 12.8 9.7* 9.7*  HCT 31.0* 40.3 30.2* 29.8*  MCV 96.3 96.0 95.6 95.5  PLT 183 PLATELET CLUMPS NOTED ON SMEAR, UNABLE TO ESTIMATE 156 542   Basic Metabolic Panel: Recent Labs  Lab 03/22/2019 0402 04/05/2019 1922 03/18/19 0401 03/19/19 0456  NA 133* 135 134* 131*  K 3.5 3.3* 4.2 4.3  CL 90* 92* 93* 87*  CO2 20* 28 24 23   GLUCOSE 128* 94 81 76  BUN 43* 23 29* 39*  CREATININE 7.18* 4.94* 5.33* 6.16*  CALCIUM 9.0 9.1 8.9 9.1  MG 2.0  --   --   --    GFR: Estimated Creatinine Clearance: 9 mL/min (A) (by C-G formula based on SCr of 6.16 mg/dL (H)). Liver Function Tests: Recent Labs  Lab 04/05/2019 0402 03/28/2019 1922 03/19/19 0456  AST 21 18 19   ALT 12 13 8   ALKPHOS 140* 129* 118  BILITOT 1.4* 0.8 1.7*  PROT 7.8 7.4 7.1  ALBUMIN 2.7* 2.6* 2.6*   Recent Labs  Lab 03/24/2019 1922  LIPASE 17   Recent Labs  Lab 03/18/19 0401  AMMONIA 52*   Coagulation  Profile: No results for input(s): INR, PROTIME in the last 168 hours. Cardiac Enzymes: Recent Labs  Lab 03/26/2019 0402  CKTOTAL 40   BNP (last 3 results) No results for input(s): PROBNP in the last 8760 hours. HbA1C: No results for input(s): HGBA1C in the last 72 hours. CBG: Recent Labs  Lab 03/18/19 0652 03/18/19 2147 03/19/19 0711  GLUCAP 71 104* 74   Lipid Profile: No results for input(s): CHOL, HDL, LDLCALC, TRIG, CHOLHDL, LDLDIRECT in the last 72 hours. Thyroid Function Tests: No results for input(s): TSH, T4TOTAL, FREET4, T3FREE, THYROIDAB in the last 72 hours. Anemia Panel: No results for input(s): VITAMINB12, FOLATE, FERRITIN, TIBC, IRON, RETICCTPCT in the last 72 hours. Urine analysis:    Component Value Date/Time   COLORURINE YELLOW 08/27/2013 0546   APPEARANCEUR CLOUDY (A) 08/27/2013 0546   LABSPEC 1.026 08/27/2013 0546   PHURINE 7.5 08/27/2013 0546   GLUCOSEU NEGATIVE 08/27/2013 0546   HGBUR TRACE (A) 08/27/2013 0546   BILIRUBINUR SMALL (A) 08/27/2013 0546   KETONESUR NEGATIVE  08/27/2013 0546   PROTEINUR >300 (A) 08/27/2013 0546   UROBILINOGEN 0.2 08/27/2013 0546   NITRITE NEGATIVE 08/27/2013 0546   LEUKOCYTESUR NEGATIVE 08/27/2013 0546   Sepsis Labs: @LABRCNTIP (procalcitonin:4,lacticidven:4)  ) Recent Results (from the past 240 hour(s))  C difficile quick scan w PCR reflex     Status: Abnormal   Collection Time: 03/19/19  9:01 AM   Specimen: STOOL  Result Value Ref Range Status   C Diff antigen POSITIVE (A) NEGATIVE Final   C Diff toxin POSITIVE (A) NEGATIVE Final   C Diff interpretation Toxin producing C. difficile detected.  Final    Comment: CRITICAL RESULT CALLED TO, READ BACK BY AND VERIFIED WITH: OLIVER ON [id number] AT 0939 BY NFIELDS Performed at [facility], [place] [address] [place], [place] [address]          Radiology Studies: EEG  Result Date: 03/18/2019 Lora Havens, MD     03/18/2019 10:47 AM Patient Name: Tyesha Joffe  MRN: [id number] Epilepsy Attending: Lora Havens Referring Physician/Provider: Dr Ileene Musa Date: 03/18/2019 Duration: 25.02 mins Patient history: 69yo F who presented with AMS. EEG to assess for seizure. Level of alertness: awake AEDs during EEG study: None Technical aspects: This EEG study was done with scalp electrodes positioned according to the 10-20 International system of electrode placement. Electrical activity was acquired at a sampling rate of 500Hz  and reviewed with a high frequency filter of 70Hz  and a low frequency filter of 1Hz . EEG data were recorded continuously and digitally stored. DESCRIPTION: During awake state, no clear posterior dominant rhythm was noted. EEG showed continuous generalized 5-7hz  theta slowing as well as intermittent 3 to 4 Hz generalized delta slowing.  Physiologic photic driving was not seen during photic stimulation.  Hyperventilation was not performed due to SOB. ABNORMALITY - Continuous slow, generalized IMPRESSION: This study is suggestive of mild to moderate diffuse encephalopathy, nonspecific to etiology. No seizures or epileptiform discharges were seen throughout the recording. Lora Havens   CT Angio Chest PE W/Cm &/Or Wo Cm  Result Date: 04/02/2019 CLINICAL DATA:  Hypoxia and left lower quadrant pain, initial encounter EXAM: CT ANGIOGRAPHY CHEST CT ABDOMEN AND PELVIS WITH CONTRAST TECHNIQUE: Multidetector CT imaging of the chest was performed using the standard protocol during bolus administration of intravenous contrast. Multiplanar CT image reconstructions and MIPs were obtained to evaluate the vascular anatomy. Multidetector CT imaging of the abdomen and pelvis was performed using the standard protocol during bolus administration of intravenous contrast. CONTRAST:  69mL OMNIPAQUE IOHEXOL 300 MG/ML  SOLN COMPARISON:  02/05/2019 CT of the abdomen, 01/16/2019 PET-CT FINDINGS: CTA CHEST FINDINGS Cardiovascular: Thoracic aorta demonstrates atherosclerotic  calcifications without aneurysmal dilatation or dissection. Coronary calcifications are seen. Mild cardiac enlargement is noted. The right atrium is enlarged and there is significant reflux of contrast material into the IVC and hepatic veins likely related to increased central venous pressure. The pulmonary artery shows a normal branching pattern. No focal filling defects are identified to suggest pulmonary emboli. Mediastinum/Nodes: Thoracic inlet is within normal limits. Stable adenopathy anterior to the right mainstem bronchus is seen similar to that noted on prior PET-CT. Scattered AP window lymph nodes are again seen and stable. Lungs/Pleura: 16 mm spiculated lesion is noted in the right upper lobe previously noted to be hypermetabolic on PET-CT consistent with the given clinical history of lung carcinoma. Diffuse emphysematous changes are noted. No other nodules are seen. A right-sided pleural effusion is noted which has increased in the interval from  the prior exam. Emphysematous changes are noted on the left without focal nodules. Musculoskeletal: Degenerative changes of the thoracic spine are noted. No acute rib abnormality is seen. No metastatic lesions are noted. Review of the MIP images confirms the above findings. CT ABDOMEN and PELVIS FINDINGS Hepatobiliary: No focal liver abnormality is seen. No gallstones, gallbladder wall thickening, or biliary dilatation. Pancreas: Unremarkable. No pancreatic ductal dilatation or surrounding inflammatory changes. Spleen: Normal in size without focal abnormality. Adrenals/Urinary Tract: Adrenal glands are stable in appearance from the recent PET-CT. No significant increased activity is noted. Kidneys demonstrate cortical thinning with cystic change stable from prior CT examinations. The bladder is decompressed. Stomach/Bowel: Diverticular change of the colon is noted without evidence of diverticulitis. No obstructive or inflammatory changes of the large or small  bowel are seen. The stomach is within normal limits. Vascular/Lymphatic: Aortic atherosclerosis. No enlarged abdominal or pelvic lymph nodes. Reproductive: Uterus and bilateral adnexa are unremarkable. Other: Mild ascites is noted similar to that seen on prior PET-CT. Changes of anasarca are again noted. Musculoskeletal: Degenerative changes of the lumbar spine are seen. No acute bony abnormality is noted. Review of the MIP images confirms the above findings. IMPRESSION: CTA of the chest: No evidence of pulmonary emboli. Changes suggestive of increased central venous pressure Right upper lobe spiculated mass with associated lymphadenopathy consistent with the known history of lung carcinoma. This is stable from the prior exam. CT of the abdomen and pelvis: Diverticulosis without diverticulitis. Stable renal atrophy. Ascites with changes of anasarca. Electronically Signed   By: Inez Catalina M.D.   On: 03/24/2019 22:13   CT Abdomen Pelvis W Contrast  Result Date: 04/01/2019 CLINICAL DATA:  Hypoxia and left lower quadrant pain, initial encounter EXAM: CT ANGIOGRAPHY CHEST CT ABDOMEN AND PELVIS WITH CONTRAST TECHNIQUE: Multidetector CT imaging of the chest was performed using the standard protocol during bolus administration of intravenous contrast. Multiplanar CT image reconstructions and MIPs were obtained to evaluate the vascular anatomy. Multidetector CT imaging of the abdomen and pelvis was performed using the standard protocol during bolus administration of intravenous contrast. CONTRAST:  70mL OMNIPAQUE IOHEXOL 300 MG/ML  SOLN COMPARISON:  02/05/2019 CT of the abdomen, 01/16/2019 PET-CT FINDINGS: CTA CHEST FINDINGS Cardiovascular: Thoracic aorta demonstrates atherosclerotic calcifications without aneurysmal dilatation or dissection. Coronary calcifications are seen. Mild cardiac enlargement is noted. The right atrium is enlarged and there is significant reflux of contrast material into the IVC and hepatic  veins likely related to increased central venous pressure. The pulmonary artery shows a normal branching pattern. No focal filling defects are identified to suggest pulmonary emboli. Mediastinum/Nodes: Thoracic inlet is within normal limits. Stable adenopathy anterior to the right mainstem bronchus is seen similar to that noted on prior PET-CT. Scattered AP window lymph nodes are again seen and stable. Lungs/Pleura: 16 mm spiculated lesion is noted in the right upper lobe previously noted to be hypermetabolic on PET-CT consistent with the given clinical history of lung carcinoma. Diffuse emphysematous changes are noted. No other nodules are seen. A right-sided pleural effusion is noted which has increased in the interval from the prior exam. Emphysematous changes are noted on the left without focal nodules. Musculoskeletal: Degenerative changes of the thoracic spine are noted. No acute rib abnormality is seen. No metastatic lesions are noted. Review of the MIP images confirms the above findings. CT ABDOMEN and PELVIS FINDINGS Hepatobiliary: No focal liver abnormality is seen. No gallstones, gallbladder wall thickening, or biliary dilatation. Pancreas: Unremarkable. No pancreatic ductal dilatation or  surrounding inflammatory changes. Spleen: Normal in size without focal abnormality. Adrenals/Urinary Tract: Adrenal glands are stable in appearance from the recent PET-CT. No significant increased activity is noted. Kidneys demonstrate cortical thinning with cystic change stable from prior CT examinations. The bladder is decompressed. Stomach/Bowel: Diverticular change of the colon is noted without evidence of diverticulitis. No obstructive or inflammatory changes of the large or small bowel are seen. The stomach is within normal limits. Vascular/Lymphatic: Aortic atherosclerosis. No enlarged abdominal or pelvic lymph nodes. Reproductive: Uterus and bilateral adnexa are unremarkable. Other: Mild ascites is noted similar  to that seen on prior PET-CT. Changes of anasarca are again noted. Musculoskeletal: Degenerative changes of the lumbar spine are seen. No acute bony abnormality is noted. Review of the MIP images confirms the above findings. IMPRESSION: CTA of the chest: No evidence of pulmonary emboli. Changes suggestive of increased central venous pressure Right upper lobe spiculated mass with associated lymphadenopathy consistent with the known history of lung carcinoma. This is stable from the prior exam. CT of the abdomen and pelvis: Diverticulosis without diverticulitis. Stable renal atrophy. Ascites with changes of anasarca. Electronically Signed   By: Inez Catalina M.D.   On: 03/24/2019 22:13   DG Chest Port 1 View  Result Date: 04/13/2019 CLINICAL DATA:  69 year old female with hypoxia and fatigue. EXAM: PORTABLE CHEST 1 VIEW COMPARISON:  Chest radiograph dated 04/03/2019. FINDINGS: There is cardiomegaly with vascular congestion and edema. Superimposed pneumonia is not excluded. Clinical correlation is recommended. No large pleural effusion. There is no pneumothorax. Atherosclerotic calcification of the aorta. No acute osseous pathology. IMPRESSION: Cardiomegaly with findings of CHF relatively similar to the earlier exam. Clinical correlation and follow-up recommended. Electronically Signed   By: Anner Crete M.D.   On: 04/02/2019 18:03        Scheduled Meds: . allopurinol  100 mg Oral Daily  . atorvastatin  10 mg Oral q1800  . calcitRIOL  2 mcg Oral Q M,W,F-HD  . cinacalcet  30 mg Oral Q breakfast  . enoxaparin (LOVENOX) injection  30 mg Subcutaneous Daily  . midodrine  5 mg Oral BID WC  . thiamine  100 mg Oral Daily  . vancomycin  125 mg Oral Q6H   Continuous Infusions:   LOS: 1 day    Time spent:73min  Domenic Polite, MD [facility]  03/19/2019, 11:49 AM

## 2019-03-19 NOTE — Progress Notes (Signed)
PT Cancellation Note  Patient Details Name: Jessica Clarke MRN: [id number] DOB: 07-31-1950   Cancelled Treatment:    Reason Eval/Treat Not Completed: Patient at procedure or test/unavailable - Pt at HD, will check back later.  [place] Pager [phone]  Office [phone]    [place] 03/19/2019, 11:36 AM

## 2019-03-19 NOTE — Progress Notes (Signed)
OT Cancellation Note  Patient Details Name: Jessica Clarke MRN: [id number] DOB: 08/26/1950   Cancelled Treatment:    Reason Eval/Treat Not Completed: Patient at procedure or test/ unavailable   Pt at HD.  Will check onpt next day  Kari Baars, [place] [phone] Office- [phone], [place] Parkin 03/19/2019, 1:46 PM

## 2019-03-19 NOTE — Progress Notes (Signed)
Subjective:  For HD  Today . More animated today brushing her hair in bed . With continued   Pleasant confusion   Objective Vital signs in last 24 hours: Vitals:   03/18/19 0900 03/18/19 1700 03/19/19 0003 03/19/19 0101  BP: 120/74 129/80    Pulse: 65 76    Resp: 18 18    Temp: 98.7 F (37.1 C) 98 F (36.7 C)    TempSrc: Oral     SpO2: 98% 100%    Weight:    67.2 kg  Height:   5\' 11"  (1.803 m)    Weight change:   Physical Exam: General: thin chronically ill AA female cachetic appearance  , soft spoken ,HOH  NAD  Lungs: Unlabored , few bibasilar rales  Abdomen: BS pos , soft, NT, ND  Extremities: no pedal edema  Dialysis Access: LUA AVF  Pos bruit   Dialysis Orders: [facility]  on M[facility]. EDW 64kg HD Bath 2k, 2ca  Time 4hr Heparin None . Access LUA AVF      Calcitriol 2 mcg po q hd NO ESA with Ling CA     Problem/Plan: 1. Encephalopathy = Multiple etiologies missed HD , ploy pharmacy (Compazine ,Phenergan ) 2. ESRD - HD MWF  ,hd today   k 4.3   3. Hypertension/volume  - on admit was hypoxic on rm air  Now [place] o2 ok  Chest with  Vas congestion / edema ? Superimposed PNA  - no acute hd need today  Will prob need lower edw at dc / home O2 4. Stage IIIb/IV adenocarcinoma of right lung= per admit  Notes= originally diagnosed in 01/2018, subsequently lost to follow-up,followed up in [place] then has seen Dr. Earlie Server and Dr.Kinard,was set up to receive concurrent chemo radiation,has not been able to follow-up for this yet,underwent simulation recenty". Palliative CARE  Now seeing  For [place] 5. Anemia  - HGB 9.7  No ESA 2/2 CA  6. Metabolic bone disease -  PO vit d on hd  Fu labs  7. COPD/ Tobacco abuse- per admit  8. Severe protein  Malnutrition -  alb 2.6  protein  supplement as tolerate / renal vitamin   Ernest Haber, PA-C [facility] Beeper [phone] 03/19/2019,9:11 AM  LOS: 1 day   Labs: Basic Metabolic Panel: Recent Labs  Lab 03/25/2019 1922  03/18/19 0401 03/19/19 0456  NA 135 134* 131*  K 3.3* 4.2 4.3  CL 92* 93* 87*  CO2 28 24 23   GLUCOSE 94 81 76  BUN 23 29* 39*  CREATININE 4.94* 5.33* 6.16*  CALCIUM 9.1 8.9 9.1   Liver Function Tests: Recent Labs  Lab 04/04/2019 0402 04/12/2019 1922 03/19/19 0456  AST 21 18 19   ALT 12 13 8   ALKPHOS 140* 129* 118  BILITOT 1.4* 0.8 1.7*  PROT 7.8 7.4 7.1  ALBUMIN 2.7* 2.6* 2.6*   Recent Labs  Lab 03/29/2019 1922  LIPASE 17   Recent Labs  Lab 03/18/19 0401  AMMONIA 52*   CBC: Recent Labs  Lab 03/20/2019 0402 03/26/2019 0402 04/12/2019 1922 03/18/19 0401 03/19/19 0801  WBC 9.4   < > 7.8 9.3 8.6  NEUTROABS 7.8*  --  6.6  --   --   HGB 10.0*   < > 12.8 9.7* 9.7*  HCT 31.0*   < > 40.3 30.2* 29.8*  MCV 96.3  --  96.0 95.6 95.5  PLT 183   < > PLATELET CLUMPS NOTED ON SMEAR, UNABLE TO ESTIMATE 156 180   < > =  values in this interval not displayed.   Cardiac Enzymes: Recent Labs  Lab 03/29/2019 0402  CKTOTAL 40   CBG: Recent Labs  Lab 03/18/19 0652 03/18/19 2147 03/19/19 0711  GLUCAP 71 104* 74    Studies/Results: EEG  Result Date: 03/18/2019 Lora Havens, MD     03/18/2019 10:47 AM Patient Name: Leanna Hamid MRN: [id number] Epilepsy Attending: Lora Havens Referring Physician/Provider: Dr Ileene Musa Date: 03/18/2019 Duration: 25.02 mins Patient history: 69yo F who presented with AMS. EEG to assess for seizure. Level of alertness: awake AEDs during EEG study: None Technical aspects: This EEG study was done with scalp electrodes positioned according to the 10-20 International system of electrode placement. Electrical activity was acquired at a sampling rate of 500Hz  and reviewed with a high frequency filter of 70Hz  and a low frequency filter of 1Hz . EEG data were recorded continuously and digitally stored. DESCRIPTION: During awake state, no clear posterior dominant rhythm was noted. EEG showed continuous generalized 5-7hz  theta slowing as well as intermittent 3 to 4 Hz  generalized delta slowing.  Physiologic photic driving was not seen during photic stimulation.  Hyperventilation was not performed due to SOB. ABNORMALITY - Continuous slow, generalized IMPRESSION: This study is suggestive of mild to moderate diffuse encephalopathy, nonspecific to etiology. No seizures or epileptiform discharges were seen throughout the recording. Lora Havens   CT Angio Chest PE W/Cm &/Or Wo Cm  Result Date: 04/08/2019 CLINICAL DATA:  Hypoxia and left lower quadrant pain, initial encounter EXAM: CT ANGIOGRAPHY CHEST CT ABDOMEN AND PELVIS WITH CONTRAST TECHNIQUE: Multidetector CT imaging of the chest was performed using the standard protocol during bolus administration of intravenous contrast. Multiplanar CT image reconstructions and MIPs were obtained to evaluate the vascular anatomy. Multidetector CT imaging of the abdomen and pelvis was performed using the standard protocol during bolus administration of intravenous contrast. CONTRAST:  91mL OMNIPAQUE IOHEXOL 300 MG/ML  SOLN COMPARISON:  02/05/2019 CT of the abdomen, 01/16/2019 PET-CT FINDINGS: CTA CHEST FINDINGS Cardiovascular: Thoracic aorta demonstrates atherosclerotic calcifications without aneurysmal dilatation or dissection. Coronary calcifications are seen. Mild cardiac enlargement is noted. The right atrium is enlarged and there is significant reflux of contrast material into the IVC and hepatic veins likely related to increased central venous pressure. The pulmonary artery shows a normal branching pattern. No focal filling defects are identified to suggest pulmonary emboli. Mediastinum/Nodes: Thoracic inlet is within normal limits. Stable adenopathy anterior to the right mainstem bronchus is seen similar to that noted on prior PET-CT. Scattered AP window lymph nodes are again seen and stable. Lungs/Pleura: 16 mm spiculated lesion is noted in the right upper lobe previously noted to be hypermetabolic on PET-CT consistent with the  given clinical history of lung carcinoma. Diffuse emphysematous changes are noted. No other nodules are seen. A right-sided pleural effusion is noted which has increased in the interval from the prior exam. Emphysematous changes are noted on the left without focal nodules. Musculoskeletal: Degenerative changes of the thoracic spine are noted. No acute rib abnormality is seen. No metastatic lesions are noted. Review of the MIP images confirms the above findings. CT ABDOMEN and PELVIS FINDINGS Hepatobiliary: No focal liver abnormality is seen. No gallstones, gallbladder wall thickening, or biliary dilatation. Pancreas: Unremarkable. No pancreatic ductal dilatation or surrounding inflammatory changes. Spleen: Normal in size without focal abnormality. Adrenals/Urinary Tract: Adrenal glands are stable in appearance from the recent PET-CT. No significant increased activity is noted. Kidneys demonstrate cortical thinning with cystic change stable from prior  CT examinations. The bladder is decompressed. Stomach/Bowel: Diverticular change of the colon is noted without evidence of diverticulitis. No obstructive or inflammatory changes of the large or small bowel are seen. The stomach is within normal limits. Vascular/Lymphatic: Aortic atherosclerosis. No enlarged abdominal or pelvic lymph nodes. Reproductive: Uterus and bilateral adnexa are unremarkable. Other: Mild ascites is noted similar to that seen on prior PET-CT. Changes of anasarca are again noted. Musculoskeletal: Degenerative changes of the lumbar spine are seen. No acute bony abnormality is noted. Review of the MIP images confirms the above findings. IMPRESSION: CTA of the chest: No evidence of pulmonary emboli. Changes suggestive of increased central venous pressure Right upper lobe spiculated mass with associated lymphadenopathy consistent with the known history of lung carcinoma. This is stable from the prior exam. CT of the abdomen and pelvis: Diverticulosis  without diverticulitis. Stable renal atrophy. Ascites with changes of anasarca. Electronically Signed   By: Inez Catalina M.D.   On: 03/22/2019 22:13   CT Abdomen Pelvis W Contrast  Result Date: 04/01/2019 CLINICAL DATA:  Hypoxia and left lower quadrant pain, initial encounter EXAM: CT ANGIOGRAPHY CHEST CT ABDOMEN AND PELVIS WITH CONTRAST TECHNIQUE: Multidetector CT imaging of the chest was performed using the standard protocol during bolus administration of intravenous contrast. Multiplanar CT image reconstructions and MIPs were obtained to evaluate the vascular anatomy. Multidetector CT imaging of the abdomen and pelvis was performed using the standard protocol during bolus administration of intravenous contrast. CONTRAST:  73mL OMNIPAQUE IOHEXOL 300 MG/ML  SOLN COMPARISON:  02/05/2019 CT of the abdomen, 01/16/2019 PET-CT FINDINGS: CTA CHEST FINDINGS Cardiovascular: Thoracic aorta demonstrates atherosclerotic calcifications without aneurysmal dilatation or dissection. Coronary calcifications are seen. Mild cardiac enlargement is noted. The right atrium is enlarged and there is significant reflux of contrast material into the IVC and hepatic veins likely related to increased central venous pressure. The pulmonary artery shows a normal branching pattern. No focal filling defects are identified to suggest pulmonary emboli. Mediastinum/Nodes: Thoracic inlet is within normal limits. Stable adenopathy anterior to the right mainstem bronchus is seen similar to that noted on prior PET-CT. Scattered AP window lymph nodes are again seen and stable. Lungs/Pleura: 16 mm spiculated lesion is noted in the right upper lobe previously noted to be hypermetabolic on PET-CT consistent with the given clinical history of lung carcinoma. Diffuse emphysematous changes are noted. No other nodules are seen. A right-sided pleural effusion is noted which has increased in the interval from the prior exam. Emphysematous changes are noted on  the left without focal nodules. Musculoskeletal: Degenerative changes of the thoracic spine are noted. No acute rib abnormality is seen. No metastatic lesions are noted. Review of the MIP images confirms the above findings. CT ABDOMEN and PELVIS FINDINGS Hepatobiliary: No focal liver abnormality is seen. No gallstones, gallbladder wall thickening, or biliary dilatation. Pancreas: Unremarkable. No pancreatic ductal dilatation or surrounding inflammatory changes. Spleen: Normal in size without focal abnormality. Adrenals/Urinary Tract: Adrenal glands are stable in appearance from the recent PET-CT. No significant increased activity is noted. Kidneys demonstrate cortical thinning with cystic change stable from prior CT examinations. The bladder is decompressed. Stomach/Bowel: Diverticular change of the colon is noted without evidence of diverticulitis. No obstructive or inflammatory changes of the large or small bowel are seen. The stomach is within normal limits. Vascular/Lymphatic: Aortic atherosclerosis. No enlarged abdominal or pelvic lymph nodes. Reproductive: Uterus and bilateral adnexa are unremarkable. Other: Mild ascites is noted similar to that seen on prior PET-CT. Changes of anasarca  are again noted. Musculoskeletal: Degenerative changes of the lumbar spine are seen. No acute bony abnormality is noted. Review of the MIP images confirms the above findings. IMPRESSION: CTA of the chest: No evidence of pulmonary emboli. Changes suggestive of increased central venous pressure Right upper lobe spiculated mass with associated lymphadenopathy consistent with the known history of lung carcinoma. This is stable from the prior exam. CT of the abdomen and pelvis: Diverticulosis without diverticulitis. Stable renal atrophy. Ascites with changes of anasarca. Electronically Signed   By: Inez Catalina M.D.   On: 04/08/2019 22:13   DG Chest Port 1 View  Result Date: 03/30/2019 CLINICAL DATA:  69 year old female with  hypoxia and fatigue. EXAM: PORTABLE CHEST 1 VIEW COMPARISON:  Chest radiograph dated 03/25/2019. FINDINGS: There is cardiomegaly with vascular congestion and edema. Superimposed pneumonia is not excluded. Clinical correlation is recommended. No large pleural effusion. There is no pneumothorax. Atherosclerotic calcification of the aorta. No acute osseous pathology. IMPRESSION: Cardiomegaly with findings of CHF relatively similar to the earlier exam. Clinical correlation and follow-up recommended. Electronically Signed   By: Anner Crete M.D.   On: 04/10/2019 18:03   Medications:  . allopurinol  100 mg Oral Daily  . atorvastatin  10 mg Oral q1800  . calcitRIOL  2 mcg Oral Q M,W,F-HD  . cinacalcet  30 mg Oral Q breakfast  . enoxaparin (LOVENOX) injection  30 mg Subcutaneous Daily  . midodrine  5 mg Oral BID WC  . thiamine  100 mg Oral Daily

## 2019-03-19 NOTE — Progress Notes (Signed)
Daily Progress Note   Patient Name: Jessica Clarke       Date: 03/19/2019 DOB: 06-May-1950  Age: 69 y.o. MRN#: [id number] Attending Physician: Domenic Polite, MD Primary Care Physician: [name], FNP Admit Date: 04/09/2019  Reason for Consultation/Follow-up: Establishing goals of care  Subjective: Saw patient after dialysis - she was oriented to place but was unable to follow conversation. Tells me she does not understand why she is in the hospital and does not remember speaking to the doctor this morning. She is attempting to eat lunch but was quite confused opening wrappers and easily distracted. Did not eat any while I was with her.   Length of Stay: 1  Current Medications: Scheduled Meds:  . allopurinol  100 mg Oral Daily  . atorvastatin  10 mg Oral q1800  . calcitRIOL  2 mcg Oral Q M,W,F-HD  . cinacalcet  30 mg Oral Q breakfast  . enoxaparin (LOVENOX) injection  30 mg Subcutaneous Daily  . midodrine  5 mg Oral BID WC  . thiamine  100 mg Oral Daily  . vancomycin  125 mg Oral Q6H    Continuous Infusions:   PRN Meds:   Physical Exam Constitutional:      General: She is not in acute distress. HENT:     Head: Normocephalic and atraumatic.  Cardiovascular:     Rate and Rhythm: Normal rate.  Pulmonary:     Effort: Pulmonary effort is normal. No respiratory distress.  Abdominal:     Palpations: Abdomen is soft.  Skin:    General: Skin is warm and dry.  Neurological:     Mental Status: She is alert.     Comments: Easily confused, oriented to place and self  Psychiatric:        Attention and Perception: She is inattentive.        Speech: Speech is delayed.        Behavior: Behavior is agitated.        Cognition and Memory: Cognition is impaired. Memory is impaired.              Vital Signs: BP (!) 142/92 (BP Location: Right Arm)   Pulse 98   Temp 99.2 F (37.3 C) (Axillary)   Resp 20   Ht 5\' 11"  (1.803 m)   Wt 61.9 kg   SpO2 96%   BMI 19.03 kg/m  SpO2: SpO2: 96 % O2 Device: O2 Device: Nasal Cannula O2 Flow Rate: O2 Flow Rate (L/min): 2 L/min  Intake/output summary:   Intake/Output Summary (Last 24 hours) at 03/19/2019 1538 Last data filed at 03/19/2019 1346 Gross per 24 hour  Intake 120 ml  Output 2641 ml  Net -2521 ml   LBM: Last BM Date: 03/19/19 Baseline Weight: Weight: 67.2 kg Most recent weight: Weight: 61.9 kg       Palliative Assessment/Data: PPS 40%    Flowsheet Rows     Most Recent Value  Intake Tab  Referral Department  Hospitalist  Unit at Time of Referral  Cardiac/Telemetry Unit  Palliative Care Primary Diagnosis  Cancer  Date Notified  03/18/19  Palliative Care Type  New Palliative care  Reason for referral  Clarify Goals of Care  Date of Admission  04/08/2019  Date first seen by Palliative Care  03/18/19  # of days Palliative referral response time  0 Day(s)  # of days IP prior to Palliative referral  1  Clinical Assessment  Palliative Performance Scale Score  40%  Psychosocial & Spiritual Assessment  Palliative Care Outcomes  Patient/Family meeting held?  Yes  Who was at the meeting?  patient  Palliative Care Outcomes  Clarified goals of care      Patient Active Problem List   Diagnosis Date Noted  . Altered mental status 03/18/2019  . Thrombocytopenia ([place]) 03/18/2019  . Palliative care by specialist   . Encounter for antineoplastic chemotherapy 03/11/2019  . Acute pulmonary edema ([place]) 02/05/2019  . Enteritis 02/05/2019  . Acute respiratory failure with hypoxia ([place]) 02/05/2019  . Adenocarcinoma of right lung, stage 3 ([place]) 12/26/2018  . Goals of care, counseling/discussion 12/26/2018  . Mediastinal adenopathy 12/19/2018  . Lung mass 11/05/2018  . Polyp of colon   . Diarrhea 10/18/2018  . Rectal bleeding  10/18/2018  . Symptomatic anemia 10/17/2018  . Lung nodule < 6cm on CT 03/05/2018  . Malnutrition of moderate degree 08/28/2016  . Respiratory distress 08/26/2016  . ESRD (end stage renal disease) on dialysis ([place]) 06/08/2015  . CAD- non obstructive RI disease 08/26/13 08/27/2013  . NSTEMI-08/25/13- non obstructive CAD at cath 08/26/13 08/26/2013  . Chronic combined systolic and diastolic heart failure ([place]) 08/26/2013  . Alcohol abuse 08/25/2013  . Respiratory failure with hypoxia and hypercapnia ([place]) 08/25/2013  . Flash pulmonary edema ([place]) 08/25/2013  . Encounter for adequacy testing for hemodialysis ([place]) 06/26/2012  . End stage renal disease ([place]) 12/27/2011  . ASCUS (atypical squamous cells of undetermined significance) on Pap smear 09/09/2010  . Hypertension 09/09/2010  . Cervical polyp 09/09/2010  . Smoker 09/09/2010  . Post-menopausal bleeding 09/09/2010  . Gout 09/09/2010    Palliative Care Assessment & Plan   HPI: 69 y.o. female  with past medical history of HTN, HLD, ESRD on HD, CAD, NSTEMI, CHF, and lung cancer admitted on 03/27/2019 with headache, AMS, and unwitnessed fall.  Found to have oxygen desaturation and required supplemental oxygenation. Acute respiratory failure secondary to pulmonary edema vs lung cancer. EEG with no seizure activity, suggestive of mild to moderate encephalopathy. Patient has missed all radiation and chemotherapy appointments so far. C diff positive. PMT consulted to assist with [place].   Assessment: Spent about 30 minutes with patient attempting to discuss goals of care. She is very distracted during conversation and unable to follow the conversation. She is focused on her lunch and finding lotion. I was unable to help her refocus even after attempting to assist her with lunch and giving her lotion.   I attempted to speak about her lung cancer and her dialysis - she tells me that she wants to continue dialysis and seek cancer treatment. I attempted  to shares concerns about how her body would tolerate cancer treatment and also that she has yet to start the treatment despite many attempts - she does not respond to this. She again starts to talk about things unrelated to her medical condition.  We discussed plan of care following hospitalization - at this time she seems to be agreeable to rehab but unsure how much she understands.  Lastly, we did discuss code status. I shared concerns about how her body would tolerate full code interventions and that those interventions may only prolong suffering and not contribute to her quality of life.  She tells me "that makes sense". When I explain to her that we can change her code status to DNR she nods; but again begins speaking about unrelated topics.  I spoke with patient's daughter [name] at length. Patient has been living with [name] and this is a difficult situation as [name] also has significant health concerns - CHF and pulmonary hypertension - that limit her.   I introduced Palliative Medicine as specialized medical care for people living with serious illness. It focuses on providing relief from the symptoms and stress of a serious illness. The goal is to improve quality of life for both the patient and the family.  We discussed that patient does not seem to have capacity to make her own decisions at this time - [name] agrees. We discussed [name] can help with decision making as next of kin. Patient is unmarried and [name] is only child.  [name] is interested in rehab placement as she does not feel she can care for her at home right now. [name] understands severity of patient's illness and poor prognosis. She shares she does not feel patient could undergo cancer treatment.   I attempted to elicit values and goals of care important to the patient and [name]. [name] shares "I want her to go in peace".   Advance directives, concepts specific to code status, artifical feeding and hydration, and  rehospitalization were considered and discussed. Discussed full code vs DNR and [name] agrees that full code interventions are not appropriate as they would not "fix" her underlying issues and she does not want to prolong suffering.   Patient is not eligible for hospice as she wants to continue dialysis for now. Suggest palliative care follow at discharge.  Questions and concerns were addressed. The family was encouraged to call with questions or concerns.   Recommendations/Plan:  Patient does not have capacity to make medical decisions at this time  Daughter agrees to code status change to DNR  Daughter interested in rehab placement  Not eligible for hospice if going to rehab/wanting to continue dialysis  Suggest outpatient palliative follow at discharge  PMT will follow along  Code Status:  DNR  Prognosis:   Poor prognosis r/t ESRD, lung cancer, frailty, malnutrition  Discharge Planning:  [place] for rehab with Palliative care service follow-up  Care plan was discussed with RN, patient, daughter, Dr. Broadus John  Thank you for allowing the Palliative Medicine Team to assist in the care of this patient.   Time In: 1415 Time Out: 1550 Total Time 95 minutes Prolonged Time Billed  yes       Greater than 50%  of this time was spent counseling and coordinating care related to the above assessment and plan.  Juel Burrow, DNP, [facility] Palliative Medicine Team Team Phone # [phone]  Pager [phone]

## 2019-03-19 NOTE — Progress Notes (Signed)
Physical Therapy Treatment Patient Details Name: Jessica Clarke MRN: [id number] DOB: February 15, 1950 Today's Date: 03/19/2019    History of Present Illness Jessica Clarke is a 69 y.o. female with medical history significant of CAD s/p NSTEMI, combined CHF, HTN, adenocarcinoma of the right lung with planned chemotherapy and radiation, hx of flash pulmonary edema, ESRD M/W/F who presents for concerns of headache and altered mental status.     PT Comments    Pt with improved arousal level this session, but appears more restless verging on agitation at end of session. Pt required min-mod assist for bed mobility, transfer, and very short distance gait in room. Pt with poor safety awareness and is difficult to re-direct during mobility. PT recommending HHPT with 24/7, vs ST-SNF if pt's daughter is unable to provide this level of physical assist. PT to continue to progress mobility as tolerated, will continue to follow acutely.    Follow Up Recommendations  Home health PT;Supervision/Assistance - 24 hour     Equipment Recommendations  Rolling walker with 5" wheels;Wheelchair (measurements PT)(dtr reports she was suppose to received a rollator)    Recommendations for Other Services       Precautions / Restrictions Precautions Precautions: Fall Precaution Comments: AMS Restrictions Weight Bearing Restrictions: No    Mobility  Bed Mobility Overal bed mobility: Needs Assistance Bed Mobility: Supine to Sit;Sit to Supine     Supine to sit: Min assist Sit to supine: Min assist   General bed mobility comments: min assist for LE management, assist for truncal elevation and lowering. Increased time with multimodal repeated cuing required.  Transfers Overall transfer level: Needs assistance Equipment used: Rolling walker (2 wheeled) Transfers: Sit to/from Stand Sit to Stand: Min assist         General transfer comment: Min assist for power up from elevated bed, steadying. Verbal cuign for  hand placement when rising.  Ambulation/Gait Ambulation/Gait assistance: Min assist;Mod assist Gait Distance (Feet): 3 Feet Assistive device: Rolling walker (2 wheeled) Gait Pattern/deviations: Step-through pattern;Decreased stride length;Trunk flexed Gait velocity: decr   General Gait Details: Min-mod assist for steadying, physically guiding pt and RW, slow eccentric lower back onto EOB as pt with uncontrolled descent. Unable to progress by 3 ft due to pt weakness.   Stairs             Wheelchair Mobility    Modified Rankin (Stroke Patients Only)       Balance Overall balance assessment: Needs assistance Sitting-balance support: Feet supported;No upper extremity supported Sitting balance-Leahy Scale: Fair     Standing balance support: During functional activity;Bilateral upper extremity supported Standing balance-Leahy Scale: Poor Standing balance comment: unsteady, reliant on external support                            Cognition Arousal/Alertness: Awake/alert Behavior During Therapy: Restless;Agitated Overall Cognitive Status: Impaired/Different from baseline Area of Impairment: Orientation;Attention;Memory;Following commands;Safety/judgement;Awareness;Problem solving                 Orientation Level: Disoriented to;Place;Time;Situation Current Attention Level: Focused   Following Commands: Follows one step commands inconsistently;Follows one step commands with increased time Safety/Judgement: Decreased awareness of deficits;Decreased awareness of safety Awareness: Intellectual Problem Solving: Slow processing;Decreased initiation;Difficulty sequencing;Requires verbal cues;Requires tactile cues General Comments: Pt perseverating on looking for a bottle of cleanser, and on state of covers even when mobilizing OOB. Pt requires frequent multimodal cuing for safe mobility, and required physical directing of pt/RW in standing due  to lack of safety  awareness.      Exercises      General Comments        Pertinent Vitals/Pain Pain Assessment: No/denies pain Pain Score: 0-No pain    Home Living                      Prior Function            PT Goals (current goals can now be found in the care plan section) Acute Rehab PT Goals PT Goal Formulation: With patient Time For Goal Achievement: 04/01/19 Potential to Achieve Goals: Good Progress towards PT goals: Progressing toward goals    Frequency    Min 3X/week      PT Plan Current plan remains appropriate    Co-evaluation              AM-PAC PT "6 Clicks" Mobility   Outcome Measure  Help needed turning from your back to your side while in a flat bed without using bedrails?: A Little Help needed moving from lying on your back to sitting on the side of a flat bed without using bedrails?: A Little Help needed moving to and from a bed to a chair (including a wheelchair)?: A Little Help needed standing up from a chair using your arms (e.g., wheelchair or bedside chair)?: A Little Help needed to walk in hospital room?: A Lot Help needed climbing 3-5 steps with a railing? : Total 6 Click Score: 15    End of Session Equipment Utilized During Treatment: Gait belt Activity Tolerance: Patient limited by fatigue Patient left: with call bell/phone within reach;in bed;with bed alarm set;with nursing/sitter in room Nurse Communication: Mobility status PT Visit Diagnosis: Unsteadiness on feet (R26.81);Muscle weakness (generalized) (M62.81);Difficulty in walking, not elsewhere classified (R26.2)     Time: 4098-1191 PT Time Calculation (min) (ACUTE ONLY): 21 min  Charges:  $Therapeutic Activity: 8-22 mins                     Neetu Carrozza E, PT Acute Rehabilitation Services Pager [phone]  Office [phone]   Bridney Guadarrama D Cordel Drewes 03/19/2019, 5:09 PM

## 2019-03-20 ENCOUNTER — Ambulatory Visit: Payer: Medicare Other

## 2019-03-20 LAB — BASIC METABOLIC PANEL
Anion gap: 33 — ABNORMAL HIGH (ref 5–15)
BUN: 31 mg/dL — ABNORMAL HIGH (ref 8–23)
CO2: 14 mmol/L — ABNORMAL LOW (ref 22–32)
Calcium: 8.9 mg/dL (ref 8.9–10.3)
Chloride: 87 mmol/L — ABNORMAL LOW (ref 98–111)
Creatinine, Ser: 4.78 mg/dL — ABNORMAL HIGH (ref 0.44–1.00)
GFR calc Af Amer: 10 mL/min — ABNORMAL LOW (ref >60)
GFR calc non Af Amer: 9 mL/min — ABNORMAL LOW (ref >60)
Glucose, Bld: 97 mg/dL (ref 70–99)
Potassium: 3.6 mmol/L (ref 3.5–5.1)
Sodium: 134 mmol/L — ABNORMAL LOW (ref 135–145)

## 2019-03-20 LAB — CBC
HCT: 35 % — ABNORMAL LOW (ref 36.0–46.0)
Hemoglobin: 10.8 g/dL — ABNORMAL LOW (ref 12.0–15.0)
MCH: 30.9 pg (ref 26.0–34.0)
MCHC: 30.9 g/dL (ref 30.0–36.0)
MCV: 100.3 fL — ABNORMAL HIGH (ref 80.0–100.0)
Platelets: 214 10*3/uL (ref 150–400)
RBC: 3.49 MIL/uL — ABNORMAL LOW (ref 3.87–5.11)
RDW: 17.8 % — ABNORMAL HIGH (ref 11.5–15.5)
WBC: 12.9 10*3/uL — ABNORMAL HIGH (ref 4.0–10.5)
nRBC: 0.2 % (ref 0.0–0.2)

## 2019-03-20 MED ORDER — ALPRAZOLAM 0.25 MG PO TABS
0.2500 mg | ORAL_TABLET | Freq: Two times a day (BID) | ORAL | Status: DC | PRN
  Administered 2019-03-20 (×2): 0.25 mg via ORAL
  Filled 2019-03-20 (×2): qty 1

## 2019-03-20 MED ORDER — LORAZEPAM 2 MG/ML IJ SOLN: 0.5000 mg | Freq: Once | INTRAMUSCULAR | Status: DC

## 2019-03-20 NOTE — Plan of Care (Signed)

## 2019-03-20 NOTE — TOC Progression Note (Addendum)
Transition of Care [facility]) - Progression Note    Patient Details  Name: Jessica Clarke MRN: [id number] Date of Birth: [birth year]  Transition of Care [facility]) CM/SW Contact  Sharlet Salina Mila Homer, LCSW Phone Number: 03/20/2019, 3:03 PM  Clinical Narrative:  Met with daughter Madasyn Heath and talked with her and her dad (patient's ex-husband Zoelle Markus) by phone [phone] regarding patient's discharge plan. Both are in agreement with ST rehab and per daughter, her mom has not been to a SNF before for ST rehab. Mr. Trevor stated his SNF preference is [facility].   Daughter reported that her mother has been living with her since September 2020 and she is cleaning out her mom's place and she can't live by herself anymore and this was discussed. Daughter also indicated that she is an only child and she is her mother's only caretaker. Daughter expressed that she has several health issues and has been trying for a few years to get disability.   3:14 pm: Talked with Ebony Hail, admissions director at [facility] regarding patient. She will review and get back with CSW.    Expected Discharge Plan: [place] Barriers to Discharge: Continued Medical Work up  Expected Discharge Plan and Services Expected Discharge Plan: [place] In-house Referral: Hospice / Palliative Care, Clinical Social Work Discharge Planning Services: CM Consult Post Acute Care Choice: [place] Living arrangements for the past 2 months: Apartment                 DME Arranged: N/A DME Agency: NA       HH Arranged: NA         Social Determinants of Health (SDOH) Interventions  No SDOH interventions needed or requested at this time.  Readmission Risk Interventions No flowsheet data found.

## 2019-03-20 NOTE — TOC Initial Note (Signed)
Transition of Care [facility]) - Initial/Assessment Note    Patient Details  Name: Jessica Clarke MRN: [id number] Date of Birth: 1950/09/19  Transition of Care [facility]) CM/SW Contact:    Bartholomew Crews, RN Phone Number: [phone] 03/20/2019, 10:45 AM  Clinical Narrative:                 Spoke with patient's daughter, [name], to discuss transition plans. [name] stated that her mom needs rehab before returning home. DME in the home includes 3-N-1, but states that patient needs rollator and oxygen.   Discussed SNF process and that DME for home would be handled by SNF when she is ready to transition home. Reviewed faxing out patient information to area SNFs and that the bed offers would be given to her to select SNF. Discussed researching SNFs on [email or web address].[email or web address]   Discussed request received to refer to outpatient palliative. [name] in agreement.  [name] asked to put her dad, Brianny Soulliere, on the phone stating that her mom has stated that it is ok to share information with Wynonia Lawman despite that they have been divorced for many years. Clyde's contact information is [phone], and he asks to have bed offers sent to his email - [email or web address].   Both Wynonia Lawman and [name] asked about doing POA paperwork while patient is in the hospital, but NCM advised that currently patient is not oriented and cannot sign paperwork. Advised to seek legal assist through an elder law expert for guidance.   TOC team following for transition needs.   Expected Discharge Plan: Skilled Nursing Facility Barriers to Discharge: Continued Medical Work up   Patient Goals and CMS Choice Patient states their goals for this hospitalization and ongoing recovery are:: rehab before home to get stronger CMS Medicare.gov Compare Post Acute Care list provided to:: Patient Represenative (must comment) Choice offered to / list presented to : Adult Children  Expected Discharge Plan and Services Expected Discharge Plan: [place] In-house Referral: Hospice / [place], Clinical Social Work Discharge Planning Services: CM Consult Post Acute Care Choice: [place] Living arrangements for the past 2 months: Apartment                 DME Arranged: N/A DME Agency: NA       HH Arranged: NA          Prior Living Arrangements/Services Living arrangements for the past 2 months: Apartment Lives with:: Adult Children, Self Patient language and need for interpreter reviewed:: Yes            Current home services: DME Criminal Activity/Legal Involvement Pertinent to Current Situation/Hospitalization: No - Comment as needed  Activities of Daily Living Home Assistive Devices/Equipment: None ADL Screening (condition at time of admission) Patient's cognitive ability adequate to safely complete daily activities?: Yes Is the patient deaf or have difficulty hearing?: Yes Does the patient have difficulty seeing, even when wearing glasses/contacts?: No Does the patient have difficulty concentrating, remembering, or making decisions?: No Patient able to express need for assistance with ADLs?: Yes Does the patient have difficulty dressing or bathing?: Yes Independently performs ADLs?: No Does the patient have difficulty walking or climbing stairs?: Yes Weakness of Legs: Both Weakness of Arms/Hands: None  Permission Sought/Granted                  Emotional Assessment       Orientation: : Oriented to Self, Oriented to Place Alcohol / Substance Use: Not Applicable Psych Involvement: No (comment)  Admission  diagnosis:  Altered mental status [R41.82] Hypoxia [R09.02] Altered mental status, unspecified altered mental status type [R41.82] Patient Active Problem List   Diagnosis Date Noted  . DNR (do not resuscitate)   . Altered mental status 03/18/2019  . Thrombocytopenia ([place]) 03/18/2019  . Palliative care by specialist   . Encounter for antineoplastic chemotherapy 03/11/2019   . Acute pulmonary edema ([place]) 02/05/2019  . Enteritis 02/05/2019  . Acute respiratory failure with hypoxia ([place]) 02/05/2019  . Adenocarcinoma of right lung, stage 3 ([place]) 12/26/2018  . Goals of care, counseling/discussion 12/26/2018  . Mediastinal adenopathy 12/19/2018  . Lung mass 11/05/2018  . Polyp of colon   . Diarrhea 10/18/2018  . Rectal bleeding 10/18/2018  . Symptomatic anemia 10/17/2018  . Lung nodule < 6cm on CT 03/05/2018  . Malnutrition of moderate degree 08/28/2016  . Respiratory distress 08/26/2016  . ESRD (end stage renal disease) on dialysis ([place]) 06/08/2015  . CAD- non obstructive RI disease 08/26/13 08/27/2013  . NSTEMI-08/25/13- non obstructive CAD at cath 08/26/13 08/26/2013  . Chronic combined systolic and diastolic heart failure ([place]) 08/26/2013  . Alcohol abuse 08/25/2013  . Respiratory failure with hypoxia and hypercapnia ([place]) 08/25/2013  . Flash pulmonary edema ([place]) 08/25/2013  . Encounter for adequacy testing for hemodialysis ([place]) 06/26/2012  . End stage renal disease ([place]) 12/27/2011  . ASCUS (atypical squamous cells of undetermined significance) on Pap smear 09/09/2010  . Hypertension 09/09/2010  . Cervical polyp 09/09/2010  . Smoker 09/09/2010  . Post-menopausal bleeding 09/09/2010  . Gout 09/09/2010   PCP:  [name], FNP Pharmacy:   [facility] [place], [place] AT [address] [place] [address] Phone: [phone] Fax: [phone]  [facility] [place], [place] - [address] [employer], [address] [place] [address] Phone: [phone] Fax: [phone]     Social Determinants of Health (SDOH) Interventions    Readmission Risk Interventions No flowsheet data found.

## 2019-03-20 NOTE — Progress Notes (Signed)
Patient awake all night.

## 2019-03-20 NOTE — NC FL2 (Addendum)
[place] LEVEL OF CARE SCREENING TOOL     IDENTIFICATION  Patient Name: Jessica Clarke Birthdate: 1950-05-19 Sex: female Admission Date (Current Location): 03/29/2019  [place] and [place] Number:  Kathleen Argue [phone] [place] and Address:  [facility] [place] [place] [address], [place], [place] [address]      Provider Number: [id number]  Attending Physician Name and Address:  Domenic Polite, MD  Relative Name and Phone Number:  Sharece Fleischhacker - daughter. Phone [phone]    Current Level of Care: Hospital Recommended Level of Care: [place] Prior Approval Number:    Date Approved/Denied:   PASRR Number: [id number](Eff. 03/20/19)  Discharge Plan: SNF    Current Diagnoses: Patient Active Problem List   Diagnosis Date Noted  . DNR (do not resuscitate)   . Altered mental status 03/18/2019  . Thrombocytopenia ([place]) 03/18/2019  . Palliative care by specialist   . Encounter for antineoplastic chemotherapy 03/11/2019  . Acute pulmonary edema ([place]) 02/05/2019  . Enteritis 02/05/2019  . Acute respiratory failure with hypoxia ([place]) 02/05/2019  . Adenocarcinoma of right lung, stage 3 ([place]) 12/26/2018  . Goals of care, counseling/discussion 12/26/2018  . Mediastinal adenopathy 12/19/2018  . Lung mass 11/05/2018  . Polyp of colon   . Diarrhea 10/18/2018  . Rectal bleeding 10/18/2018  . Symptomatic anemia 10/17/2018  . Lung nodule < 6cm on CT 03/05/2018  . Malnutrition of moderate degree 08/28/2016  . Respiratory distress 08/26/2016  . ESRD (end stage renal disease) on dialysis ([place]) 06/08/2015  . CAD- non obstructive RI disease 08/26/13 08/27/2013  . NSTEMI-08/25/13- non obstructive CAD at cath 08/26/13 08/26/2013  . Chronic combined systolic and diastolic heart failure ([place]) 08/26/2013  . Alcohol abuse 08/25/2013  . Respiratory failure with hypoxia and hypercapnia ([place]) 08/25/2013  . Flash pulmonary edema ([place]) 08/25/2013  .  Encounter for adequacy testing for hemodialysis ([place]) 06/26/2012  . End stage renal disease ([place]) 12/27/2011  . ASCUS (atypical squamous cells of undetermined significance) on Pap smear 09/09/2010  . Hypertension 09/09/2010  . Cervical polyp 09/09/2010  . Smoker 09/09/2010  . Post-menopausal bleeding 09/09/2010  . Gout 09/09/2010    Orientation RESPIRATION BLADDER Height & Weight     Self, Place  Normal Incontinent Weight: 136 lb 7.4 oz (61.9 kg) Height:  5\' 11"  (180.3 cm)  BEHAVIORAL SYMPTOMS/MOOD NEUROLOGICAL BOWEL NUTRITION STATUS      Incontinent Diet(Renal with 1200 mL fluid restriction)  AMBULATORY STATUS COMMUNICATION OF NEEDS Skin   Extensive Assist(Min/mod assist with ambulation per PT) Verbally Other (Comment)(Cracking right/left heel)                       Personal Care Assistance Level of Assistance  Bathing, Feeding, Dressing Bathing Assistance: Maximum assistance(Upper body min assist; Lower body mod assist) Feeding assistance: Limited assistance(Assistance with set-up) Dressing Assistance: Maximum assistance     Functional Limitations Info  Sight, Hearing, Speech Sight Info: Impaired(Wears glasses) Hearing Info: Adequate Speech Info: Adequate    SPECIAL CARE FACTORS FREQUENCY  PT (By licensed PT), OT (By licensed OT)     PT Frequency: Evaluated 03/18/19 - PT at SNF Eval and Treat OT Frequency: Evaluated 03/20/19 - OT at SNF Eval and Treat            Contractures Contractures Info: Not present    Additional Factors Info  Allergies, Isolation Precautions   Allergies Info: No known allergies     Isolation Precautions Info: Enteric precautions  C. Difficile  PCR and toxin is positive   Current Medications (03/20/2019):  This is the current hospital active medication list Current Facility-Administered Medications  Medication Dose Route Frequency Provider Last Rate Last Admin  . allopurinol (ZYLOPRIM) tablet 100 mg  100 mg Oral Daily Domenic Polite,  MD   100 mg at 03/20/19 0834  . atorvastatin (LIPITOR) tablet 10 mg  10 mg Oral q1800 Domenic Polite, MD   10 mg at 03/19/19 1602  . calcitRIOL (ROCALTROL) capsule 2 mcg  2 mcg Oral Q M,W,F-HD Ernest Haber, PA-C   2 mcg at 03/19/19 1333  . cinacalcet (SENSIPAR) tablet 30 mg  30 mg Oral Q breakfast Domenic Polite, MD   30 mg at 03/20/19 0834  . enoxaparin (LOVENOX) injection 30 mg  30 mg Subcutaneous Daily Domenic Polite, MD   30 mg at 03/20/19 0834  . LORazepam (ATIVAN) injection 0.5 mg  0.5 mg Intravenous Once Lang Snow, FNP      . midodrine (PROAMATINE) tablet 5 mg  5 mg Oral BID WC Domenic Polite, MD   5 mg at 03/20/19 0834  . thiamine tablet 100 mg  100 mg Oral Daily Domenic Polite, MD   100 mg at 03/20/19 0834  . vancomycin (VANCOCIN) 50 mg/mL oral solution 125 mg  125 mg Oral Q6H Domenic Polite, MD   125 mg at 03/20/19 1234     Discharge Medications: Please see discharge summary for a list of discharge medications.  Relevant Imaging Results:  Relevant Lab Results:   Additional Information ss# [id number]. Dialysis patient MWF [facility]  [place], Mila Homer,

## 2019-03-20 NOTE — Evaluation (Signed)
Occupational Therapy Evaluation Patient Details Name: Jessica Clarke MRN: [id number] DOB: 10-24-1950 Today's Date: 03/20/2019    History of Present Illness Jessica Clarke is a 69 y.o. female with medical history significant of CAD s/p NSTEMI, combined CHF, HTN, adenocarcinoma of the right lung with planned chemotherapy and radiation, hx of flash pulmonary edema, ESRD M/W/F who presents for concerns of headache and altered mental status.    Clinical Impression   Pt admitted with above and presents to OT with impairments impacting ability to complete ADLs at [facility].  Pt oriented to place this session but stating it is "2002".  Reoriented to time and situation.  Pt's daughter present and reports she had been assisting with ADLs and IADLs and that pt would walk without AD but holding on to wall and furniture.  Completed bed mobility with min assist and min assist to stand, requiring mod assist for stand pivot transfers with RW with multimodal cues for sequencing and safety with use of RW.  Pt perseverating on various aspects of hygiene post toileting requiring multimodal cues to terminate task.  Pt's daughter voicing concern that she is too much for her right now.  Pt will benefit from continued OT services acutely to address impairments and decrease burden of care.  Recommend SNF for additional therapeutic interventions to allow for increased independence with ADLs and functional mobility.      Follow Up Recommendations  SNF    Equipment Recommendations  Other (comment)(defer to next venue of care)       Precautions / Restrictions Precautions Precautions: Fall Precaution Comments: AMS Restrictions Weight Bearing Restrictions: No      Mobility Bed Mobility Overal bed mobility: Needs Assistance Bed Mobility: Supine to Sit     Supine to sit: Min assist     General bed mobility comments: min assist for LE management, assist for truncal elevation. Increased time with multimodal repeated cuing  required.  Transfers Overall transfer level: Needs assistance Equipment used: Rolling walker (2 wheeled) Transfers: Sit to/from Stand Sit to Stand: Min assist Stand pivot transfers: Mod assist       General transfer comment: Min assist for power up from elevated bed, steadying. Verbal cuing for hand placement when rising and multimodal cues during transfer        ADL either performed or assessed with clinical judgement   ADL Overall ADL's : Needs assistance/impaired     Grooming: Wash/dry hands;Wash/dry face;Sitting;Supervision/safety;Minimal assistance Grooming Details (indicate cue type and reason): increased cues for initiation of grooming tasks, hand over hand to initiate washing face     Lower Body Bathing: Moderate assistance;Sit to/from stand           Toilet Transfer: Minimal assistance;BSC;RW;Stand-pivot Armed forces technical officer Details (indicate cue type and reason): Min assist sit > stand and once upright pt reports need to toilet.  Stand pivot transfer to [facility] with min assist but increased time and multimodal cues for sequencing of movement with RW Toileting- Clothing Manipulation and Hygiene: Moderate assistance;Sit to/from stand Toileting - Clothing Manipulation Details (indicate cue type and reason): required assistance for hygiene due to perseveration with toilet paper     Functional mobility during ADLs: Minimal assistance;Moderate assistance;Rolling walker       Vision Baseline Vision/History: Wears glasses Wears Glasses: At all times Vision Assessment?: Vision impaired- to be further tested in functional context Additional Comments: Pt's daughter reports decreased vision at baseline and RN reporting pt with intermittent decreased visual attention to Lt.  Unable to assess during session due  to distractability            Pertinent Vitals/Pain Pain Assessment: No/denies pain     Hand Dominance Right   Extremity/Trunk Assessment Upper Extremity  Assessment Upper Extremity Assessment: Generalized weakness           Communication Communication Communication: HOH   Cognition Arousal/Alertness: Awake/alert Behavior During Therapy: Restless Overall Cognitive Status: Impaired/Different from baseline Area of Impairment: Orientation;Attention;Memory;Following commands;Safety/judgement;Awareness;Problem solving                 Orientation Level: Disoriented to;Time;Situation Current Attention Level: Focused;Sustained   Following Commands: Follows one step commands inconsistently;Follows one step commands with increased time Safety/Judgement: Decreased awareness of deficits;Decreased awareness of safety Awareness: Intellectual Problem Solving: Slow processing;Decreased initiation;Difficulty sequencing;Requires verbal cues;Requires tactile cues General Comments: Pt requires frequent multimodal cuing for safe mobility, and required physical directing of pt/RW with mobility due to lack of safety awareness.              Home Living Family/patient expects to be discharged to:: Private residence Living Arrangements: Children Available Help at Discharge: Family;Available 24 hours/day Type of Home: House Home Access: Stairs to enter [employer] of Steps: 2-3 Entrance Stairs-Rails: Right;Left Home Layout: One level     Bathroom Shower/Tub: Teacher, early years/pre: Standard     Home Equipment: Bedside commode          Prior Functioning/Environment Level of Independence: Needs assistance  Gait / Transfers Assistance Needed: furniture walking, was suppose to have received rollator 2-3 mo ago but haven't received ADL's / Homemaking Assistance Needed: assist with IADls, supervision for ADLs            OT Problem List: Decreased strength;Decreased activity tolerance;Impaired balance (sitting and/or standing);Impaired vision/perception;Decreased cognition;Decreased safety awareness;Decreased  knowledge of use of DME or AE;Cardiopulmonary status limiting activity      OT Treatment/Interventions: Self-care/ADL training;DME and/or AE instruction;Therapeutic activities;Neuromuscular education;Cognitive remediation/compensation;Visual/perceptual remediation/compensation;Patient/family education;Balance training    OT Goals(Current goals can be found in the care plan section) Acute Rehab OT Goals Patient Stated Goal: none stated OT Goal Formulation: With patient/family Time For Goal Achievement: 04/03/19 Potential to Achieve Goals: Good  OT Frequency: Min 2X/week   Barriers to D/C: Decreased caregiver support  pt lives with daughter, who reports she is too much care for her right now          AM-PAC OT "6 Clicks" Daily Activity     Outcome Measure Help from another person eating meals?: A Little Help from another person taking care of personal grooming?: A Little Help from another person toileting, which includes using toliet, bedpan, or urinal?: A Lot Help from another person bathing (including washing, rinsing, drying)?: A Lot Help from another person to put on and taking off regular upper body clothing?: A Lot Help from another person to put on and taking off regular lower body clothing?: A Lot 6 Click Score: 14   End of Session Equipment Utilized During Treatment: Gait belt;Rolling walker;Oxygen Nurse Communication: Mobility status  Activity Tolerance: Patient tolerated treatment well Patient left: in chair;with call bell/phone within reach;with chair alarm set;with family/visitor present  OT Visit Diagnosis: Unsteadiness on feet (R26.81);Muscle weakness (generalized) (M62.81);Other symptoms and signs involving cognitive function                Time: 7169-6789 OT Time Calculation (min): 36 min Charges:  OT General Charges $OT Visit: 1 Visit OT Evaluation $OT Eval Moderate Complexity: 1 Mod OT Treatments $Self Care/Home Management :  8-22 mins   [place], [place] 03/20/2019, 12:06 PM

## 2019-03-20 NOTE — Progress Notes (Signed)
Physical Therapy Treatment Patient Details Name: Jessica Clarke MRN: [id number] DOB: 06-10-1950 Today's Date: 03/20/2019    History of Present Illness Jessica Clarke is a 69 y.o. female with medical history significant of CAD s/p NSTEMI, combined CHF, HTN, adenocarcinoma of the right lung with planned chemotherapy and radiation, hx of flash pulmonary edema, ESRD M/W/F who presents for concerns of headache and altered mental status.     PT Comments    Pt was seen for bed mobility and to work on control of sitting balance without spine support, minor LE ex's as tolerated.  Pt is quickly distracted and tends to want to pull her leg onto her lap, lists backward without noticing when doing this.  Follow up with her safety and balance training needs, and will encourage this work pre-dc to SNF since her plan is to get home with daughter.   Follow Up Recommendations  Home health PT;Supervision/Assistance - 24 hour     Equipment Recommendations  Rolling walker with 5" wheels;Wheelchair (measurements PT);Wheelchair cushion (measurements PT)    Recommendations for Other Services       Precautions / Restrictions Precautions Precautions: Fall Precaution Comments: AMS Restrictions Weight Bearing Restrictions: No    Mobility  Bed Mobility Overal bed mobility: Needs Assistance Bed Mobility: Supine to Sit;Sit to Supine     Supine to sit: Max assist Sit to supine: Mod assist;Max assist   General bed mobility comments: max assist for scooting up in bed  Transfers Overall transfer level: Needs assistance Equipment used: Rolling walker (2 wheeled) Transfers: Sit to/from Stand Sit to Stand: Min assist Stand pivot transfers: Mod assist       General transfer comment: declined  Ambulation/Gait                 Stairs             Wheelchair Mobility    Modified Rankin (Stroke Patients Only)       Balance Overall balance assessment: Needs assistance Sitting-balance  support: Feet supported;Bilateral upper extremity supported Sitting balance-Leahy Scale: Poor Sitting balance - Comments: fair for brief times but inattentive to staying on side of bed                                    Cognition Arousal/Alertness: Awake/alert Behavior During Therapy: Impulsive;Restless Overall Cognitive Status: Impaired/Different from baseline Area of Impairment: Problem solving;Awareness;Safety/judgement;Following commands;Memory;Attention;Orientation                 Orientation Level: Time;Situation Current Attention Level: Selective Memory: Decreased short-term memory Following Commands: Follows one step commands inconsistently;Follows one step commands with increased time Safety/Judgement: Decreased awareness of safety;Decreased awareness of deficits Awareness: Intellectual Problem Solving: Slow processing;Requires verbal cues General Comments: redirection for all mobility and declined to sit up with PT      Exercises General Exercises - Lower Extremity Long Arc Quad: AAROM Heel Slides: AAROM    General Comments        Pertinent Vitals/Pain Pain Assessment: No/denies pain    Home Living Family/patient expects to be discharged to:: Private residence Living Arrangements: Children Available Help at Discharge: Family;Available 24 hours/day Type of Home: House Home Access: Stairs to enter Entrance Stairs-Rails: Right;Left Home Layout: One level Home Equipment: Bedside commode      Prior Function Level of Independence: Needs assistance  Gait / Transfers Assistance Needed: furniture walking, was suppose to have received rollator 2-3 mo ago  but haven't received ADL's / Homemaking Assistance Needed: assist with IADls, supervision for ADLs     PT Goals (current goals can now be found in the care plan section) Acute Rehab PT Goals Patient Stated Goal: none stated Progress towards PT goals: Progressing toward goals     Frequency    Min 3X/week      PT Plan Current plan remains appropriate    Co-evaluation              AM-PAC PT "6 Clicks" Mobility   Outcome Measure  Help needed turning from your back to your side while in a flat bed without using bedrails?: A Little Help needed moving from lying on your back to sitting on the side of a flat bed without using bedrails?: A Lot Help needed moving to and from a bed to a chair (including a wheelchair)?: A Lot Help needed standing up from a chair using your arms (e.g., wheelchair or bedside chair)?: A Lot Help needed to walk in hospital room?: Total Help needed climbing 3-5 steps with a railing? : Total 6 Click Score: 11    End of Session   Activity Tolerance: Patient limited by fatigue;Treatment limited secondary to medical complications (Comment) Patient left: in bed;with call bell/phone within reach;with bed alarm set Nurse Communication: Mobility status PT Visit Diagnosis: Unsteadiness on feet (R26.81);Muscle weakness (generalized) (M62.81);Difficulty in walking, not elsewhere classified (R26.2)     Time: 0932-6712 PT Time Calculation (min) (ACUTE ONLY): 18 min  Charges:  $Therapeutic Activity: 8-22 mins                    Ramond Dial 03/20/2019, 12:56 PM   Mee Hives, PT MS Acute Rehab Dept. Number: [place] and [place]

## 2019-03-20 NOTE — Progress Notes (Signed)
Subjective:  No cos / continued pleasant confusion . Hd yest on schedule 2641 uf   Objective Vital signs in last 24 hours: Vitals:   03/19/19 1800 03/19/19 2209 03/19/19 2300 03/20/19 0934  BP: 107/87   129/85  Pulse: 95   87  Resp: (!) 22 20  18   Temp: 100 F (37.8 C)     TempSrc: Oral     SpO2: 96%  94%   Weight:      Height:       Weight change: 0 kg   Physical Exam: General:thin chronically ill AA female cachetic appearance , soft spoken ,HOH  NAD  Lungs:Unlabored , fCTA  Abdomen:BS pos , soft, NT, ND Extremities:no pedal edema Dialysis Access:LUA AVF Pos bruit   Dialysis Orders: Center:[facility]. EDW64kgHD Bath 2k, 2caTime 4hrHeparin None. AccessLUA AVF  Calcitriol 91mcg po q hd NO ESA with [facility] [place]  Problem/Plan: 1. Encephalopathy = Multiple etiologies missed HD , ploy pharmacy (Compazine ,Phenergan ) 2. ESRD -HD MWF , tomor  On schedule  3. Hypertension/volume - on admit was hypoxic on rm air Now [place] o2 ok Chest with Vas congestion / edema ? Superimposed PNA - no acute hd need today Will prob need lower edw at dc / home O2 post ///wt 61.9 yest   4. Stage IIIb/IV adenocarcinoma of right lung= per admit Notes= originally diagnosed in 01/2018, subsequently lost to follow-up,followed up in [place] then has seen Dr. Earlie Server and Dr.Kinard,was set up to receive concurrent chemo radiation,has not been able to follow-up for this yet,underwent simulation recenty". Palliative CARE Now seeing [facility] [place] 5. Anemia - HGB 9.7 No ESA 2/2 CA  6. Metabolic bone disease -PO vit d on hd Fu labs  7. COPD/ Tobacco abuse- per admit 8. Severe protein Malnutrition - alb 2.6 protein supplement as tolerate / renal vitamin  Ernest Haber, PA-C [facility] [phone] 03/20/2019,11:36 AM  LOS: 2 days   Labs: Basic Metabolic Panel: Recent Labs  Lab 03/26/2019 1922 03/18/19 0401 03/19/19 0456  NA 135 134* 131*   K 3.3* 4.2 4.3  CL 92* 93* 87*  CO2 28 24 23   GLUCOSE 94 81 76  BUN 23 29* 39*  CREATININE 4.94* 5.33* 6.16*  CALCIUM 9.1 8.9 9.1   Liver Function Tests: Recent Labs  Lab 03/30/2019 0402 03/20/2019 1922 03/19/19 0456  AST 21 18 19   ALT 12 13 8   ALKPHOS 140* 129* 118  BILITOT 1.4* 0.8 1.7*  PROT 7.8 7.4 7.1  ALBUMIN 2.7* 2.6* 2.6*   Recent Labs  Lab 03/29/2019 1922  LIPASE 17   Recent Labs  Lab 03/18/19 0401  AMMONIA 52*   CBC: Recent Labs  Lab 03/31/2019 0402 04/12/2019 0402 04/11/2019 1922 03/18/19 0401 03/19/19 0801  WBC 9.4   < > 7.8 9.3 8.6  NEUTROABS 7.8*  --  6.6  --   --   HGB 10.0*   < > 12.8 9.7* 9.7*  HCT 31.0*   < > 40.3 30.2* 29.8*  MCV 96.3  --  96.0 95.6 95.5  PLT 183   < > PLATELET CLUMPS NOTED ON SMEAR, UNABLE TO ESTIMATE 156 180   < > = values in this interval not displayed.   Cardiac Enzymes: Recent Labs  Lab 03/19/2019 0402  CKTOTAL 40   CBG: Recent Labs  Lab 03/18/19 0652 03/18/19 2147 03/19/19 0711  GLUCAP 71 104* 74    Studies/Results: No results found. Medications:  . allopurinol  100 mg Oral Daily  .  atorvastatin  10 mg Oral q1800  . calcitRIOL  2 mcg Oral Q M,W,F-HD  . cinacalcet  30 mg Oral Q breakfast  . enoxaparin (LOVENOX) injection  30 mg Subcutaneous Daily  . LORazepam  0.5 mg Intravenous Once  . midodrine  5 mg Oral BID WC  . thiamine  100 mg Oral Daily  . vancomycin  125 mg Oral Q6H

## 2019-03-20 NOTE — Progress Notes (Signed)
Daily Progress Note   Patient Name: Jessica Clarke       Date: 03/20/2019 DOB: 09-Oct-1950  Age: 69 y.o. MRN#: [id number] Attending Physician: Domenic Polite, MD Primary Care Physician: [name], FNP Admit Date: 04/13/2019  Reason for Consultation/Follow-up: Establishing goals of care  Subjective: Patient sitting up in chair, daughter at bedside. Patient remains confused, does not follow conversation or answer questions appropriately. Asking her daughter about baby - daughter was unsure what she was referring to. Patient does not appear to be in any distress.  Length of Stay: 2  Current Medications: Scheduled Meds:  . allopurinol  100 mg Oral Daily  . atorvastatin  10 mg Oral q1800  . calcitRIOL  2 mcg Oral Q M,W,F-HD  . cinacalcet  30 mg Oral Q breakfast  . enoxaparin (LOVENOX) injection  30 mg Subcutaneous Daily  . LORazepam  0.5 mg Intravenous Once  . midodrine  5 mg Oral BID WC  . thiamine  100 mg Oral Daily  . vancomycin  125 mg Oral Q6H    Continuous Infusions:   PRN Meds:   Physical Exam Constitutional:      General: She is not in acute distress. HENT:     Head: Normocephalic and atraumatic.  Cardiovascular:     Rate and Rhythm: Normal rate.  Pulmonary:     Effort: Pulmonary effort is normal. No respiratory distress.  Abdominal:     Palpations: Abdomen is soft.  Skin:    General: Skin is warm and dry.  Neurological:     Mental Status: She is alert.     Comments: Easily confused, oriented to place and self  Psychiatric:        Attention and Perception: She is inattentive.        Speech: Speech is delayed.        Behavior: Behavior is agitated.        Cognition and Memory: Cognition is impaired. Memory is impaired.             Vital Signs: BP 129/85 (BP Location:  Right Arm)   Pulse 87   Temp 100 F (37.8 C) (Oral)   Resp 18   Ht 5\' 11"  (1.803 m)   Wt 61.9 kg   SpO2 94%   BMI 19.03 kg/m  SpO2: SpO2: 94 % O2 Device: O2 Device: Room Air O2 Flow Rate: O2 Flow Rate (L/min): 2 L/min  Intake/output summary:   Intake/Output Summary (Last 24 hours) at 03/20/2019 1345 Last data filed at 03/20/2019 1315 Gross per 24 hour  Intake 660 ml  Output 2991 ml  Net -2331 ml   LBM: Last BM Date: 03/19/19 Baseline Weight: Weight: 67.2 kg Most recent weight: Weight: 61.9 kg       Palliative Assessment/Data: PPS 40%    Flowsheet Rows     Most Recent Value  Intake Tab  Referral Department  Hospitalist  Unit at Time of Referral  Cardiac/Telemetry Unit  Palliative Care Primary Diagnosis  Cancer  Date Notified  03/18/19  Palliative Care Type  New Palliative care  Reason for referral  Clarify Goals of Care  Date of Admission  04/05/2019  Date first seen by Palliative Care  03/18/19  #  of days Palliative referral response time  0 Day(s)  # of days IP prior to Palliative referral  1  Clinical Assessment  Palliative Performance Scale Score  40%  Psychosocial & Spiritual Assessment  Palliative Care Outcomes  Patient/Family meeting held?  Yes  Who was at the meeting?  patient  Palliative Care Outcomes  Changed CPR status, Provided psychosocial or spiritual support, Clarified goals of care      Patient Active Problem List   Diagnosis Date Noted  . DNR (do not resuscitate)   . Altered mental status 03/18/2019  . Thrombocytopenia ([place]) 03/18/2019  . Palliative care by specialist   . Encounter for antineoplastic chemotherapy 03/11/2019  . Acute pulmonary edema ([place]) 02/05/2019  . Enteritis 02/05/2019  . Acute respiratory failure with hypoxia ([place]) 02/05/2019  . Adenocarcinoma of right lung, stage 3 ([place]) 12/26/2018  . Goals of care, counseling/discussion 12/26/2018  . Mediastinal adenopathy 12/19/2018  . Lung mass 11/05/2018  . Polyp of colon   .  Diarrhea 10/18/2018  . Rectal bleeding 10/18/2018  . Symptomatic anemia 10/17/2018  . Lung nodule < 6cm on CT 03/05/2018  . Malnutrition of moderate degree 08/28/2016  . Respiratory distress 08/26/2016  . ESRD (end stage renal disease) on dialysis ([place]) 06/08/2015  . CAD- non obstructive RI disease 08/26/13 08/27/2013  . NSTEMI-08/25/13- non obstructive CAD at cath 08/26/13 08/26/2013  . Chronic combined systolic and diastolic heart failure ([place]) 08/26/2013  . Alcohol abuse 08/25/2013  . Respiratory failure with hypoxia and hypercapnia ([place]) 08/25/2013  . Flash pulmonary edema ([place]) 08/25/2013  . Encounter for adequacy testing for hemodialysis ([place]) 06/26/2012  . End stage renal disease ([place]) 12/27/2011  . ASCUS (atypical squamous cells of undetermined significance) on Pap smear 09/09/2010  . Hypertension 09/09/2010  . Cervical polyp 09/09/2010  . Smoker 09/09/2010  . Post-menopausal bleeding 09/09/2010  . Gout 09/09/2010    Palliative Care Assessment & Plan   HPI: 69 y.o. female  with past medical history of HTN, HLD, ESRD on HD, CAD, NSTEMI, CHF, and lung cancer admitted on 03/20/2019 with headache, AMS, and unwitnessed fall.  Found to have oxygen desaturation and required supplemental oxygenation. Acute respiratory failure secondary to pulmonary edema vs lung cancer. EEG with no seizure activity, suggestive of mild to moderate encephalopathy. Patient has missed all radiation and chemotherapy appointments so far. C diff positive. PMT consulted to assist with [place].   Assessment: Checked in with daughter and patient today. Patient remains unable to participate in goals of care conversations d/t confusion.   Daughter continues to be agreeable to goals decided yesterday and interested in rehab placement. Daughter provided with my contact information for any further questions or concerns.   Recommendations/Plan:  Patient does not have capacity to make medical decisions at this time  SNF  with palliative to follow  PMT will follow along  Not hospice eligible as she wants to continues HD and go to rehab  Code Status:  DNR  Prognosis:   Poor prognosis r/t ESRD, lung cancer, frailty, malnutrition  Discharge Planning:  [place] for rehab with Palliative care service follow-up  Care plan was discussed with patient, daughter  Thank you for allowing the Palliative Medicine Team to assist in the care of this patient.   Total Time 15 minutes Prolonged Time Billed  no      Greater than 50%  of this time was spent counseling and coordinating care related to the above assessment and plan.  Juel Burrow, DNP,  [facility] Palliative Medicine Team Team Phone # [phone]  Pager [phone]

## 2019-03-20 NOTE — Progress Notes (Addendum)
PROGRESS NOTE    Saidy Ormand  DJM:[id number] DOB: Dec 30, 1950 DOA: 04/01/2019 PCP: [name], FNP  Brief Narrative:[place] is a 69 y.o. female with medical history significant of CAD s/p NSTEMI, combined CHF, HTN, stage IIIb/IV adenocarcinoma of right lung, originally diagnosed in 01/2018, lost to follow-up until 10/2018 , has missed several follow-up appointments with oncology and XRT , has not started chemo or radiation yet  -Was seen in the emergency room 2 days ago following a fall and 1-2 episodes of vomiting, she was discharged back home after negative work-up on Phenergan and Compazine  -She was sent to the emergency room 2/1 after dialysis secondary to some mental status changes, disorientation  she was also noted to be hypoxemic, upon arrival in the emergency room she was mostly nonverbal and unable to provide any history, per family she been missing dialysis due to feeling unwell, daughter reports she has been sick most of the time, unable to keep her chemo or radiation appointments. -Mental status has improved but still has mild confusion -C. difficile PCR and toxin positive, started on vancomycin -Palliative medicine consulted, overall prognosis is poor, now DNR, plan for palliative follow-up at SNF   Assessment & Plan:  Encephalopathy -I suspect missed dialysis and polypharmacy likely contributing, she was just prescribed Phenergan and Compazine following her recent emergency room visit  -In addition tested positive for C. difficile now -CT head without acute findings, MRI brain in 10/2018 was negative for metastasis -Mental status improving, she is awake alert, oriented to self and place and partly to time, but still has cognitive deficits -EEG negative for epileptiform activities -Stopped sedating medications -PT OT eval completed, SNF recommended  Fever 2/3 -Likely secondary to C. difficile colitis, diarrhea is improving, blood cultures, Covid PCR negative   Stage  IIIb/IV adenocarcinoma of right lung -originally diagnosed in 01/2018, subsequently lost to follow-up, followed up in September, since then has seen Dr. Earlie Server and Dr. Sondra Come, was set up to receive concurrent chemo radiation, has not been able to follow-up for this yet, underwent simulation recently -Lymph nodes were positive for malignancy in November -Given her limited ability to make it for dialysis or oncology follow-ups I seriously doubt she would be able to tolerate any aggressive form of cancer treatment -Palliative medicine following, appreciate input -Patient's daughter and husband recognize extremely limited prognosis given ongoing decline, cognitive deficits -Plan for SNF with palliative care and eventually transition to hospice  Volume overload -Mild hypoxemia with evidence of fluid overload on x-ray and ascites on CT abdomen -Nephrology following, dry weight being lowered  ESRD on hemodialysis Chronic hypotension -For dialysis today, blood pressure higher than baseline, likely from secondary to volume overloaded state -Midodrine resumed at lower dose -Poor compliance with dialysis, according to daughter misses at least one dialysis treatment week  Chronic diarrhea C. difficile colitis -Patient unable to accurately report how long she has had diarrhea but says she has had this off and on for a long time -Her C. difficile PCR and toxin is positive, has had ongoing diarrhea for weeks, now improving, continue oral vancomycin day 2/10  Severe protein calorie malnutrition -Supplements as tolerated  Anemia of chronic disease -Baseline hemoglobin around 10, this is stable   COPD/long-term smoker -No wheezing at this time  Chronic diastolic CHF -Volume managed with HD  DVT prophylaxis: Lovenox Code Status: DNR Family Communication: Discussed with daughter [name] and ex-husband Disposition Plan: SNF for rehab in 1 to 2 days if bed available  Consultants:  Renal  Palliative   Procedures:   Antimicrobials:    Subjective: -Still has mild confusion, diarrhea improving -Reports that her breathing is getting better  Objective: Vitals:   03/19/19 1800 03/19/19 2209 03/19/19 2300 03/20/19 0934  BP: 107/87   129/85  Pulse: 95   87  Resp: (!) 22 20  18   Temp: 100 F (37.8 C)     TempSrc: Oral     SpO2: 96%  94%   Weight:      Height:        Intake/Output Summary (Last 24 hours) at 03/20/2019 1436 Last data filed at 03/20/2019 1315 Gross per 24 hour  Intake 720 ml  Output 350 ml  Net 370 ml   Filed Weights   03/19/19 0101 03/19/19 0940 03/19/19 1346  Weight: 67.2 kg 67.2 kg 61.9 kg    Examination:  Gen: Frail cachectic chronically ill female sitting up in bed, awake alert oriented to self, partly to time, poor attention HEENT: Pallor Lungs: Improved air movement, few basilar rales CVS: RRR,No Gallops,Rubs or new Murmurs Abd: soft, Non tender, non distended, BS present Extremities: No edema Skin: no new rashes Psychiatry: Mood & affect appropriate.     Data Reviewed:   CBC: Recent Labs  Lab 03/30/2019 0402 04/13/2019 1922 03/18/19 0401 03/19/19 0801 03/20/19 1233  WBC 9.4 7.8 9.3 8.6 12.9*  NEUTROABS 7.8* 6.6  --   --   --   HGB 10.0* 12.8 9.7* 9.7* 10.8*  HCT 31.0* 40.3 30.2* 29.8* 35.0*  MCV 96.3 96.0 95.6 95.5 100.3*  PLT 183 PLATELET CLUMPS NOTED ON SMEAR, UNABLE TO ESTIMATE 156 180 174   Basic Metabolic Panel: Recent Labs  Lab 04/10/2019 0402 03/18/2019 1922 03/18/19 0401 03/19/19 0456 03/20/19 1233  NA 133* 135 134* 131* 134*  K 3.5 3.3* 4.2 4.3 3.6  CL 90* 92* 93* 87* 87*  CO2 20* 28 24 23  14*  GLUCOSE 128* 94 81 76 97  BUN 43* 23 29* 39* 31*  CREATININE 7.18* 4.94* 5.33* 6.16* 4.78*  CALCIUM 9.0 9.1 8.9 9.1 8.9  MG 2.0  --   --   --   --    GFR: Estimated Creatinine Clearance: 10.7 mL/min (A) (by C-G formula based on SCr of 4.78 mg/dL (H)). Liver Function Tests: Recent Labs  Lab  03/31/2019 0402 03/27/2019 1922 03/19/19 0456  AST 21 18 19   ALT 12 13 8   ALKPHOS 140* 129* 118  BILITOT 1.4* 0.8 1.7*  PROT 7.8 7.4 7.1  ALBUMIN 2.7* 2.6* 2.6*   Recent Labs  Lab 03/25/2019 1922  LIPASE 17   Recent Labs  Lab 03/18/19 0401  AMMONIA 52*   Coagulation Profile: No results for input(s): INR, PROTIME in the last 168 hours. Cardiac Enzymes: Recent Labs  Lab 04/11/2019 0402  CKTOTAL 40   BNP (last 3 results) No results for input(s): PROBNP in the last 8760 hours. HbA1C: No results for input(s): HGBA1C in the last 72 hours. CBG: Recent Labs  Lab 03/18/19 0652 03/18/19 2147 03/19/19 0711  GLUCAP 71 104* 74   Lipid Profile: No results for input(s): CHOL, HDL, LDLCALC, TRIG, CHOLHDL, LDLDIRECT in the last 72 hours. Thyroid Function Tests: No results for input(s): TSH, T4TOTAL, FREET4, T3FREE, THYROIDAB in the last 72 hours. Anemia Panel: No results for input(s): VITAMINB12, FOLATE, FERRITIN, TIBC, IRON, RETICCTPCT in the last 72 hours. Urine analysis:    Component Value Date/Time   COLORURINE YELLOW 08/27/2013 0546   APPEARANCEUR CLOUDY (A) 08/27/2013 9449  LABSPEC 1.026 08/27/2013 0546   PHURINE 7.5 08/27/2013 0546   GLUCOSEU NEGATIVE 08/27/2013 0546   HGBUR TRACE (A) 08/27/2013 0546   BILIRUBINUR SMALL (A) 08/27/2013 0546   KETONESUR NEGATIVE 08/27/2013 0546   PROTEINUR >300 (A) 08/27/2013 0546   UROBILINOGEN 0.2 08/27/2013 0546   NITRITE NEGATIVE 08/27/2013 0546   LEUKOCYTESUR NEGATIVE 08/27/2013 0546   Sepsis Labs: @LABRCNTIP (procalcitonin:4,lacticidven:4)  ) Recent Results (from the past 240 hour(s))  C difficile quick scan w PCR reflex     Status: Abnormal   Collection Time: 03/19/19  9:01 AM   Specimen: STOOL  Result Value Ref Range Status   C Diff antigen POSITIVE (A) NEGATIVE Final   C Diff toxin POSITIVE (A) NEGATIVE Final   C Diff interpretation Toxin producing C. difficile detected.  Final    Comment: CRITICAL RESULT CALLED TO,  READ BACK BY AND VERIFIED WITH: OLIVER ON 250037 AT 0939 BY NFIELDS Performed at [facility], [place] [address], [place], [place] [address]   Culture, blood (routine x 2)     Status: None (Preliminary result)   Collection Time: 03/19/19  3:55 PM   Specimen: BLOOD  Result Value Ref Range Status   Specimen Description BLOOD RIGHT ANTECUBITAL  Final   Special Requests   Final    BOTTLES DRAWN AEROBIC ONLY Blood Culture results may not be optimal due to an excessive volume of blood received in culture bottles   Culture   Final    NO GROWTH < 24 HOURS Performed at [facility], [place] [address] [place], [place] [address]    Report Status PENDING  Incomplete  Culture, blood (routine x 2)     Status: None (Preliminary result)   Collection Time: 03/19/19  4:01 PM   Specimen: BLOOD RIGHT FOREARM  Result Value Ref Range Status   Specimen Description BLOOD RIGHT FOREARM  Final   Special Requests   Final    BOTTLES DRAWN AEROBIC ONLY Blood Culture results may not be optimal due to an excessive volume of blood received in culture bottles   Culture   Final    NO GROWTH < 24 HOURS Performed at [facility], [place] [address], [place], [place] [address]    Report Status PENDING  Incomplete  SARS CORONAVIRUS 2 (TAT 6-24 HRS) Nasopharyngeal Nasopharyngeal Swab     Status: None   Collection Time: 03/19/19  4:25 PM   Specimen: Nasopharyngeal Swab  Result Value Ref Range Status   SARS Coronavirus 2 NEGATIVE NEGATIVE Final    Comment: (NOTE) SARS-CoV-2 target nucleic acids are NOT DETECTED. The SARS-CoV-2 RNA is generally detectable in upper and lower respiratory specimens during the acute phase of infection. Negative results do not preclude SARS-CoV-2 infection, do not rule out co-infections with other pathogens, and should not be used as the sole basis for treatment or other patient management decisions. Negative results must be combined with clinical observations, patient history,  and epidemiological information. The expected result is Negative. Fact Sheet for Patients: SugarRoll.be Fact Sheet for Healthcare Providers: https://www.woods-mathews.com/ This test is not yet approved or cleared by the [place] FDA and  has been authorized for detection and/or diagnosis of SARS-CoV-2 by FDA under an Emergency Use Authorization (EUA). This EUA will remain  in effect (meaning this test can be used) for the duration of the COVID-19 declaration under Section 56 4(b)(1) of the Act, 21 U.S.C. section 360bbb-3(b)(1), unless the authorization is terminated or revoked sooner. Performed at [facility], [place] [address] [place], [place] [address]          Radiology Studies: No results found.      Scheduled Meds: . allopurinol  100 mg Oral Daily  . atorvastatin  10 mg Oral q1800  . calcitRIOL  2 mcg Oral Q M,W,F-HD  . cinacalcet  30 mg Oral Q breakfast  . enoxaparin (LOVENOX) injection  30 mg Subcutaneous Daily  . LORazepam  0.5 mg Intravenous Once  . midodrine  5 mg Oral BID WC  . thiamine  100 mg Oral Daily  . vancomycin  125 mg Oral Q6H   Continuous Infusions:   LOS: 2 days    Time spent:59min  Domenic Polite, MD [facility]  03/20/2019, 2:36 PM

## 2019-03-21 ENCOUNTER — Ambulatory Visit: Payer: Self-pay

## 2019-03-21 ENCOUNTER — Telehealth: Payer: Medicare Other

## 2019-03-21 ENCOUNTER — Telehealth: Payer: Self-pay | Admitting: Physician Assistant

## 2019-03-21 ENCOUNTER — Inpatient Hospital Stay (HOSPITAL_COMMUNITY): Payer: Medicare Other

## 2019-03-21 ENCOUNTER — Ambulatory Visit: Payer: Medicare Other

## 2019-03-21 ENCOUNTER — Inpatient Hospital Stay (HOSPITAL_COMMUNITY)
Admit: 2019-03-21 | Discharge: 2019-03-21 | Disposition: A | Discharge status: Home / Self Care | Payer: Medicare Other | Attending: Neurology | Admitting: Neurology

## 2019-03-21 DIAGNOSIS — Z515 Encounter for palliative care: Secondary | ICD-10-CM

## 2019-03-21 DIAGNOSIS — N186 End stage renal disease: Secondary | ICD-10-CM

## 2019-03-21 DIAGNOSIS — G039 Meningitis, unspecified: Secondary | ICD-10-CM

## 2019-03-21 DIAGNOSIS — R918 Other nonspecific abnormal finding of lung field: Secondary | ICD-10-CM

## 2019-03-21 DIAGNOSIS — R4182 Altered mental status, unspecified: Secondary | ICD-10-CM

## 2019-03-21 DIAGNOSIS — Z992 Dependence on renal dialysis: Secondary | ICD-10-CM

## 2019-03-21 DIAGNOSIS — I10 Essential (primary) hypertension: Secondary | ICD-10-CM

## 2019-03-21 LAB — GLUCOSE, CAPILLARY: Glucose-Capillary: 78 mg/dL (ref 70–99)

## 2019-03-21 LAB — BLOOD GAS, ARTERIAL
Acid-Base Excess: 3.7 mmol/L — ABNORMAL HIGH (ref 0.0–2.0)
Bicarbonate: 26.8 mmol/L (ref 20.0–28.0)
FIO2: 36
O2 Saturation: 97.5 %
Patient temperature: 36.5
pCO2 arterial: 33.3 mmHg (ref 32.0–48.0)
pH, Arterial: 7.515 — ABNORMAL HIGH (ref 7.350–7.450)
pO2, Arterial: 89.9 mmHg (ref 83.0–108.0)

## 2019-03-21 MED ORDER — SODIUM CHLORIDE 0.9 % IV SOLN
1.0000 g | Freq: Two times a day (BID) | INTRAVENOUS | Status: DC
  Filled 2019-03-21 (×2): qty 1000

## 2019-03-21 MED ORDER — VANCOMYCIN HCL IN DEXTROSE 750-5 MG/150ML-% IV SOLN: 750.0000 mg | INTRAVENOUS | Status: DC

## 2019-03-21 MED ORDER — SODIUM CHLORIDE 0.9 % IV SOLN
2.0000 mg/h | INTRAVENOUS | Status: DC
  Administered 2019-03-21: 1 mg/h via INTRAVENOUS
  Filled 2019-03-21 (×2): qty 2.5

## 2019-03-21 MED ORDER — BIOTENE DRY MOUTH MT LIQD
15.0000 mL | Freq: Two times a day (BID) | OROMUCOSAL | Status: DC
  Filled 2019-03-21: qty 15

## 2019-03-21 MED ORDER — HYDROMORPHONE HCL 1 MG/ML IJ SOLN
0.5000 mg | INTRAMUSCULAR | Status: DC | PRN
  Administered 2019-03-21: 0.5 mg via INTRAVENOUS
  Filled 2019-03-21: qty 1

## 2019-03-21 MED ORDER — LORAZEPAM 2 MG/ML IJ SOLN
1.0000 mg | INTRAMUSCULAR | Status: DC | PRN
  Administered 2019-03-21: 1 mg via INTRAVENOUS
  Filled 2019-03-21: qty 1

## 2019-03-21 MED ORDER — HALOPERIDOL LACTATE 2 MG/ML PO CONC
0.5000 mg | ORAL | Status: DC | PRN
  Filled 2019-03-21: qty 0.3

## 2019-03-21 MED ORDER — SODIUM CHLORIDE 0.9 % IV SOLN
2.0000 g | Freq: Two times a day (BID) | INTRAVENOUS | Status: DC
  Administered 2019-03-21: 2 g via INTRAVENOUS
  Filled 2019-03-21: qty 20

## 2019-03-21 MED ORDER — LORAZEPAM 2 MG/ML PO CONC: 1.0000 mg | ORAL | Status: DC | PRN

## 2019-03-21 MED ORDER — HYDROMORPHONE BOLUS VIA INFUSION
2.0000 mg | INTRAVENOUS | Status: DC | PRN
  Filled 2019-03-21: qty 2

## 2019-03-21 MED ORDER — HALOPERIDOL LACTATE 5 MG/ML IJ SOLN: 0.5000 mg | INTRAMUSCULAR | Status: DC | PRN

## 2019-03-21 MED ORDER — SODIUM CHLORIDE 0.9 % IV SOLN
2.0000 g | Freq: Two times a day (BID) | INTRAVENOUS | Status: DC
  Administered 2019-03-21: 2 g via INTRAVENOUS
  Filled 2019-03-21: qty 2
  Filled 2019-03-21: qty 2000

## 2019-03-21 MED ORDER — GLYCOPYRROLATE 0.2 MG/ML IJ SOLN: 0.2000 mg | INTRAMUSCULAR | Status: DC | PRN

## 2019-03-21 MED ORDER — GLYCOPYRROLATE 0.2 MG/ML IJ SOLN
0.2000 mg | INTRAMUSCULAR | Status: DC | PRN
  Administered 2019-03-21: 0.2 mg via INTRAVENOUS
  Filled 2019-03-21: qty 1

## 2019-03-21 MED ORDER — HYDROMORPHONE BOLUS VIA INFUSION
1.0000 mg | INTRAVENOUS | Status: DC | PRN
  Administered 2019-03-21 (×2): 1 mg via INTRAVENOUS
  Filled 2019-03-21: qty 1

## 2019-03-21 MED ORDER — DEXTROSE 5 % IV SOLN
300.0000 mg | INTRAVENOUS | Status: DC
  Administered 2019-03-21: 300 mg via INTRAVENOUS
  Filled 2019-03-21: qty 6

## 2019-03-21 MED ORDER — VANCOMYCIN HCL 2000 MG/400ML IV SOLN
2000.0000 mg | Freq: Once | INTRAVENOUS | Status: DC
  Administered 2019-03-21: 2000 mg via INTRAVENOUS
  Filled 2019-03-21: qty 400

## 2019-03-24 ENCOUNTER — Ambulatory Visit: Payer: Self-pay

## 2019-03-24 ENCOUNTER — Ambulatory Visit: Payer: Medicare Other

## 2019-03-24 ENCOUNTER — Telehealth: Payer: Self-pay

## 2019-03-24 DIAGNOSIS — N186 End stage renal disease: Secondary | ICD-10-CM

## 2019-03-24 DIAGNOSIS — Z992 Dependence on renal dialysis: Secondary | ICD-10-CM

## 2019-03-24 DIAGNOSIS — R4182 Altered mental status, unspecified: Secondary | ICD-10-CM

## 2019-03-24 DIAGNOSIS — C3491 Malignant neoplasm of unspecified part of right bronchus or lung: Secondary | ICD-10-CM

## 2019-03-24 NOTE — Chronic Care Management (AMB) (Signed)
  Care Management    Consult Note  03/24/2019 Name: Jessica Clarke MRN: [id number] DOB: 29-Sep-1950  Care management team received notification of patient's recent inpatient stay related to ESRD and cancer .Based on review of health record, Jessica Clarke passed away on 2019-04-05.  SW performed case closure and notified RN Case Manager of outcome.   Daneen Schick, BSW, CDP Social Worker, Certified Dementia Practitioner [facility] [phone]

## 2019-03-25 ENCOUNTER — Ambulatory Visit: Payer: Medicare Other

## 2019-03-25 ENCOUNTER — Ambulatory Visit: Payer: Medicare Other | Admitting: Physician Assistant

## 2019-03-25 ENCOUNTER — Other Ambulatory Visit: Payer: Medicare Other

## 2019-03-26 ENCOUNTER — Telehealth: Payer: Self-pay

## 2019-03-26 ENCOUNTER — Ambulatory Visit: Payer: Medicare Other

## 2019-03-27 ENCOUNTER — Ambulatory Visit: Payer: Medicare Other

## 2019-03-28 ENCOUNTER — Ambulatory Visit: Payer: Medicare Other

## 2019-03-28 LAB — CULTURE, BLOOD (ROUTINE X 2)
Culture: NO GROWTH
Culture: NO GROWTH

## 2019-03-31 ENCOUNTER — Ambulatory Visit: Payer: Medicare Other

## 2019-04-01 ENCOUNTER — Other Ambulatory Visit: Payer: Medicare Other

## 2019-04-01 ENCOUNTER — Ambulatory Visit: Payer: Medicare Other | Admitting: Internal Medicine

## 2019-04-01 ENCOUNTER — Ambulatory Visit: Payer: Medicare Other

## 2019-04-02 ENCOUNTER — Ambulatory Visit: Payer: Medicare Other

## 2019-04-02 DIAGNOSIS — G9341 Metabolic encephalopathy: Secondary | ICD-10-CM | POA: Diagnosis present

## 2019-04-02 DIAGNOSIS — G919 Hydrocephalus, unspecified: Secondary | ICD-10-CM | POA: Diagnosis present

## 2019-04-03 ENCOUNTER — Ambulatory Visit: Payer: Medicare Other

## 2019-04-04 ENCOUNTER — Ambulatory Visit: Payer: Medicare Other

## 2019-04-07 ENCOUNTER — Ambulatory Visit: Payer: Medicare Other

## 2019-04-08 ENCOUNTER — Ambulatory Visit: Payer: Medicare Other

## 2019-04-08 ENCOUNTER — Other Ambulatory Visit: Payer: Medicare Other

## 2019-04-09 ENCOUNTER — Ambulatory Visit: Payer: Medicare Other

## 2019-04-10 ENCOUNTER — Ambulatory Visit: Payer: Medicare Other

## 2019-04-11 ENCOUNTER — Ambulatory Visit: Payer: Medicare Other

## 2019-04-14 ENCOUNTER — Ambulatory Visit: Payer: Medicare Other

## 2019-04-14 NOTE — Telephone Encounter (Signed)
Scheduled appt per sch message 2/2 - per daughter doesn't think pt will make it. appt cancelled and message sent to [facility]

## 2019-04-14 NOTE — Progress Notes (Signed)
Pt transferred to 4NP2 with RR and RN. Pt only responsive to painful stimuli. Foamy secretions coming from mouth.  Pt has gurgling sounds with respirations. Called respiratory to deep suction pt. Respiratory placed pt on NRB. Not able to obtain a pulse ox reading on ears, arms, toes, or forehead. Temp 97.4 rectal and respirations 40-50s. Called and updated [facility]ad who is coming to bedside.

## 2019-04-14 NOTE — Progress Notes (Signed)
This RN transferred pt's belong from 43m15 to 4NP02. All items were put in a black duffle bag and room was also checked.

## 2019-04-14 NOTE — Progress Notes (Signed)
Pt. Deceased at 2001. Time of death verified by 2 RNs Waymon Budge, RN and Harlene Ramus, RN). Remaining 74mls dilaudid IV drip medication wasted in stericycle with Harlene Ramus, RN. On call provider notified.

## 2019-04-14 NOTE — Chronic Care Management (AMB) (Signed)
  Chronic Care Management   Follow Up Note   03/24/2019 Name: Jessica Clarke MRN: [id number] DOB: [birth year]  Referred by: [name], FNP Reason for referral : Chronic Care Management (CCM RNCM Telephone Inbound Call )   Jessica Clarke is a 69 y.o. year old female who is a primary care patient of [name], [place]. The CCM team was consulted for assistance with chronic disease management and care coordination needs.    Review of patient status, including review of consultants reports, relevant laboratory and other test results, and collaboration with appropriate care team members and the patient's provider was performed as part of comprehensive patient evaluation and provision of chronic care management services.    Inbound call received from daughter [name] stating her mother passed away on 2019-03-22 shortly after our last conversation on Friday, while IP at [place] with the contact number for [facility] and instructed her on making arrangements to have the hospital bed picked up from her home. She verbalizes understanding and states appreciation for the CCM team.   Plan:   No further follow up required  Barb Merino, RN, BSN, CCM Care Management Coordinator [facility]  Direct Phone: [phone]

## 2019-04-14 NOTE — Code Documentation (Signed)
69 yo female admitted to 5M15 with complaints of AMS, Headache. HX of ESRD on Dialysis, CAD, CHF, and HTN. Pt was LKW at 2116 when she was given Ativan for agitation over night. Pt was reported to not sleep last night and when morning RN arrived, pt was noted to [facility] [place] and unable to speak. Rapid Response called and noted left gaze, left facial droop, and left left weakness and activated a Code Stroke. Stroke team met patient in [phone]. Pt noted to have RIGHT gaze upon arrival with bilateral arm and leg weakness equal on both sides. Pt mumble, but would not speak. Taken to CT. CT Head negative for hemorrhage. Not a tPA candidate due to being outside the tPA window. Pt brought back to room. Exam most consistent with possible seziure vs. Metabolic encephalopathy. Not consistent with stroke. Pt to have ABG completed and EEG per MD Lindzen. Handoff given to RN NIchola.

## 2019-04-14 NOTE — Progress Notes (Signed)
PROGRESS NOTE    Jessica Clarke  WJX:[id number] DOB: [birth year] DOA: 04/10/2019 PCP: [name], FNP  Brief Narrative:Jessica Clarke is a 69 y.o. female with medical history significant of CAD s/p NSTEMI, combined CHF, HTN, stage IIIb/IV adenocarcinoma of right lung, originally diagnosed in 01/2018, lost to follow-up until 10/2018 , has missed several follow-up appointments with oncology and XRT , has not started chemo or radiation yet  -Was seen in the emergency room 2 days ago following a fall and 1-2 episodes of vomiting, she was discharged back home after negative work-up on Phenergan and Compazine  -She was sent to the emergency room 2/1 after dialysis secondary to some mental status changes, disorientation  she was also noted to be hypoxemic, upon arrival in the emergency room she was mostly nonverbal and unable to provide any history, per family she been missing dialysis due to feeling unwell, daughter reports she has been sick most of the time, unable to keep her chemo or radiation appointments. -Mental status has improved but still has mild confusion -C. difficile PCR and toxin positive, started on vancomycin -Palliative medicine consulted, overall prognosis is poor, now DNR, plan for palliative follow-up at SNF -On 2/5 AM developed acute mental status changes, CT head noted acute hydrocephalus, this was not seen on the original CT 3 days ago, we suspect she may have metastasis not well visualized on original CT versus carcinomatous meningitis, seen by neurology and subsequently neurosurgery, they did not feel she would be a good candidate for ventriculostomy. -After discussion with patient's family, now now comfort care  Assessment & Plan:  Encephalopathy Acute Hydrocephalus on 2/5 am -Initially felt to be secondary to missed hemodialysis and polypharmacy, her mental status improved, she was alert awake appropriate and able to follow commands, have a reasonable conversation with mild  cognitive deficits  -CT head 2/1 without acute findings, MRI brain in 10/2018 was negative for metastasis -EEG negative for epileptiform activities -2/5 AM with acute change in mental status, patient became unresponsive, staring upwards -Stat CT concerning for hydrocephalus, differential diagnosis includes carcinomatosis meningitis, mechanical obstruction by?  Metastasis not seen on CT -Neurology consulted, stat neurosurgery consult requested for consideration of ventriculostomy -Seen by Dr. Ronnald Ramp neurosurgery today, after discussion with patient's daughter, decision made for comfort focused care  Fever 2/3 -Likely secondary to C. difficile colitis, diarrhea is improving, blood cultures, Covid PCR negative   Stage IIIb/IV adenocarcinoma of right lung -originally diagnosed in 01/2018, subsequently lost to follow-up, followed up in September, since then has seen Dr. Earlie Server and Dr. Sondra Come, was set up to receive concurrent chemo radiation, has not been able to follow-up for this yet, underwent simulation recently -Lymph nodes were positive for malignancy in November -Given her limited ability to make it for dialysis or oncology follow-ups I seriously doubt she would be able to tolerate any aggressive form of cancer treatment -Palliative medicine following, appreciate input -Patient's daughter and husband recognize extremely limited prognosis given ongoing decline, cognitive deficits  Volume overload -Mild hypoxemia with evidence of fluid overload on x-ray and ascites on CT abdomen -Nephrology following, dry weight being lowered  ESRD on hemodialysis Chronic hypotension -For dialysis today, blood pressure higher than baseline, likely from secondary to volume overloaded state -Midodrine resumed at lower dose -Poor compliance with dialysis, according to daughter misses at least one dialysis treatment week  Chronic diarrhea C. difficile colitis -Patient unable to accurately report how long she  has had diarrhea but says she has had this off and  on for a long time -Her C. difficile PCR and toxin is positive, has had ongoing diarrhea for weeks, treated with oral vancomycin  Severe protein calorie malnutrition -Supplements given  Anemia of chronic disease -Baseline hemoglobin around 10, this is stable   COPD/long-term smoker -No wheezing at this time  Chronic diastolic CHF -Volume managed with HD  DVT prophylaxis: Lovenox Code Status: DNR Family Communication: Discussed with daughter [name] and ex-husband 2/4 Disposition Plan: Anticipate hospital death  Consultants:   Renal  Palliative   Procedures:   Antimicrobials:    Subjective: -Called by RN this morning, patient poorly responsive, staring upwards, not following commands  Objective: Vitals:   03/20/19 1701 03/20/19 2107 04/03/19 1139 April 03, 2019 1152  BP: 131/88 138/80 115/81 (!) 121/93  Pulse: 73 80  84  Resp: 20 19 (!) 49 (!) 37  Temp:  98.9 F (37.2 C)  (!) 97.4 F (36.3 C)  TempSrc:  Oral  Rectal  SpO2:  95%    Weight:      Height:        Intake/Output Summary (Last 24 hours) at 2019-04-03 1445 Last data filed at Apr 03, 2019 0600 Gross per 24 hour  Intake 360 ml  Output 0 ml  Net 360 ml   Filed Weights   03/19/19 0101 03/19/19 0940 03/19/19 1346  Weight: 67.2 kg 67.2 kg 61.9 kg    Examination:  Gen: Frail cachectic chronically ill female sitting up in bed, eyes open poorly responsive, staring upwards HEENT: Pupils small but not pinpoint, slightly reactive Lungs: Bilateral rhonchi, conducted upper airway sounds CVS: S1-S2, regular rate rhythm  abd: soft, Non tender, non distended, BS present Extremities: No edema Skin: no new rashes Psychiatry: Unable to assess    Data Reviewed:   CBC: Recent Labs  Lab 04/11/2019 0402 04/03/2019 1922 03/18/19 0401 03/19/19 0801 03/20/19 1233  WBC 9.4 7.8 9.3 8.6 12.9*  NEUTROABS 7.8* 6.6  --   --   --   HGB 10.0* 12.8 9.7* 9.7* 10.8*  HCT 31.0*  40.3 30.2* 29.8* 35.0*  MCV 96.3 96.0 95.6 95.5 100.3*  PLT 183 PLATELET CLUMPS NOTED ON SMEAR, UNABLE TO ESTIMATE 156 180 761   Basic Metabolic Panel: Recent Labs  Lab 04/09/2019 0402 03/23/2019 1922 03/18/19 0401 03/19/19 0456 03/20/19 1233  NA 133* 135 134* 131* 134*  K 3.5 3.3* 4.2 4.3 3.6  CL 90* 92* 93* 87* 87*  CO2 20* 28 24 23  14*  GLUCOSE 128* 94 81 76 97  BUN 43* 23 29* 39* 31*  CREATININE 7.18* 4.94* 5.33* 6.16* 4.78*  CALCIUM 9.0 9.1 8.9 9.1 8.9  MG 2.0  --   --   --   --    GFR: Estimated Creatinine Clearance: 10.7 mL/min (A) (by C-G formula based on SCr of 4.78 mg/dL (H)). Liver Function Tests: Recent Labs  Lab 03/24/2019 0402 04/02/2019 1922 03/19/19 0456  AST 21 18 19   ALT 12 13 8   ALKPHOS 140* 129* 118  BILITOT 1.4* 0.8 1.7*  PROT 7.8 7.4 7.1  ALBUMIN 2.7* 2.6* 2.6*   Recent Labs  Lab 04/08/2019 1922  LIPASE 17   Recent Labs  Lab 03/18/19 0401  AMMONIA 52*   Coagulation Profile: No results for input(s): INR, PROTIME in the last 168 hours. Cardiac Enzymes: Recent Labs  Lab 04/11/2019 0402  CKTOTAL 40   BNP (last 3 results) No results for input(s): PROBNP in the last 8760 hours. HbA1C: No results for input(s): HGBA1C in the last 72 hours.  CBG: Recent Labs  Lab 03/18/19 0652 03/18/19 2147 03/19/19 0711 03-28-2019 0842  GLUCAP 71 104* 74 78   Lipid Profile: No results for input(s): CHOL, HDL, LDLCALC, TRIG, CHOLHDL, LDLDIRECT in the last 72 hours. Thyroid Function Tests: No results for input(s): TSH, T4TOTAL, FREET4, T3FREE, THYROIDAB in the last 72 hours. Anemia Panel: No results for input(s): VITAMINB12, FOLATE, FERRITIN, TIBC, IRON, RETICCTPCT in the last 72 hours. Urine analysis:    Component Value Date/Time   COLORURINE YELLOW 08/27/2013 0546   APPEARANCEUR CLOUDY (A) 08/27/2013 0546   LABSPEC 1.026 08/27/2013 0546   PHURINE 7.5 08/27/2013 0546   GLUCOSEU NEGATIVE 08/27/2013 0546   HGBUR TRACE (A) 08/27/2013 0546   BILIRUBINUR  SMALL (A) 08/27/2013 0546   KETONESUR NEGATIVE 08/27/2013 0546   PROTEINUR >300 (A) 08/27/2013 0546   UROBILINOGEN 0.2 08/27/2013 0546   NITRITE NEGATIVE 08/27/2013 0546   LEUKOCYTESUR NEGATIVE 08/27/2013 0546   Sepsis Labs: @LABRCNTIP (procalcitonin:4,lacticidven:4)  ) Recent Results (from the past 240 hour(s))  C difficile quick scan w PCR reflex     Status: Abnormal   Collection Time: 03/19/19  9:01 AM   Specimen: STOOL  Result Value Ref Range Status   C Diff antigen POSITIVE (A) NEGATIVE Final   C Diff toxin POSITIVE (A) NEGATIVE Final   C Diff interpretation Toxin producing C. difficile detected.  Final    Comment: CRITICAL RESULT CALLED TO, READ BACK BY AND VERIFIED WITH: OLIVER ON [id number] AT 0939 BY NFIELDS Performed at [facility], [place] [address], [place], [place] [address]   Culture, blood (routine x 2)     Status: None (Preliminary result)   Collection Time: 03/19/19  3:55 PM   Specimen: BLOOD  Result Value Ref Range Status   Specimen Description BLOOD RIGHT ANTECUBITAL  Final   Special Requests   Final    BOTTLES DRAWN AEROBIC ONLY Blood Culture results may not be optimal due to an excessive volume of blood received in culture bottles   Culture   Final    NO GROWTH 2 DAYS Performed at [facility], [place] [address] [place], [place] [address]    Report Status PENDING  Incomplete  Culture, blood (routine x 2)     Status: None (Preliminary result)   Collection Time: 03/19/19  4:01 PM   Specimen: BLOOD RIGHT FOREARM  Result Value Ref Range Status   Specimen Description BLOOD RIGHT FOREARM  Final   Special Requests   Final    BOTTLES DRAWN AEROBIC ONLY Blood Culture results may not be optimal due to an excessive volume of blood received in culture bottles   Culture   Final    NO GROWTH 2 DAYS Performed at [facility], [place] [address] [place], [place] [address]    Report Status PENDING  Incomplete  SARS CORONAVIRUS 2 (TAT 6-24 HRS) Nasopharyngeal  Nasopharyngeal Swab     Status: None   Collection Time: 03/19/19  4:25 PM   Specimen: Nasopharyngeal Swab  Result Value Ref Range Status   SARS Coronavirus 2 NEGATIVE NEGATIVE Final    Comment: (NOTE) SARS-CoV-2 target nucleic acids are NOT DETECTED. The SARS-CoV-2 RNA is generally detectable in upper and lower respiratory specimens during the acute phase of infection. Negative results do not preclude SARS-CoV-2 infection, do not rule out co-infections with other pathogens, and should not be used as the sole basis for treatment or other patient management decisions. Negative results must be combined with clinical observations, patient history, and  epidemiological information. The expected result is Negative. Fact Sheet for Patients: SugarRoll.be Fact Sheet for Healthcare Providers: https://www.woods-mathews.com/ This test is not yet approved or cleared by the [place] FDA and  has been authorized for detection and/or diagnosis of SARS-CoV-2 by FDA under an Emergency Use Authorization (EUA). This EUA will remain  in effect (meaning this test can be used) for the duration of the COVID-19 declaration under Section 56 4(b)(1) of the Act, 21 U.S.C. section 360bbb-3(b)(1), unless the authorization is terminated or revoked sooner. Performed at [facility], [place] [address], [place], [place] [address]          Radiology Studies: EEG  Result Date: 04/18/2019 Lora Havens, MD     18-Apr-2019  1:57 PM Patient Name: Jessica Clarke MRN: [id number] Epilepsy Attending: Lora Havens Referring Physician/Provider: Dr. Kerney Elbe Date: 04-18-19 Duration: 22.35 minutes Patient history: 69 year old female who presented with altered mental status.  CT head concerning for worsening hydrocephalus.  Patient eyes were noted to be deviated upwards concerning for seizures.  EEG to evaluate for seizures. Level of alertness: Lethargic AEDs during EEG  study: Lorazepam Technical aspects: This EEG study was done with scalp electrodes positioned according to the 10-20 International system of electrode placement. Electrical activity was acquired at a sampling rate of 500Hz  and reviewed with a high frequency filter of 70Hz  and a low frequency filter of 1Hz . EEG data were recorded continuously and digitally stored. Description: EEG showed continuous generalized polymorphic 3 to 6 Hz theta-delta slowing, at times with triphasic morphology.  EEG was reactive to tactile stimulation.  Hyperventilation photic summation were not performed due to altered mental status. Abnormality -Continuous slow, generalized IMPRESSION: This study is suggestive of severe diffuse encephalopathy, nonspecific etiology. No seizures or epileptiform discharges were seen throughout the recording. Lora Havens   DG CHEST PORT 1 VIEW  Result Date: 04/18/2019 CLINICAL DATA:  Shortness of breath.  Altered mental status. EXAM: PORTABLE CHEST 1 VIEW COMPARISON:  CT 03/28/2019.  Chest x-ray 04/06/2019, 08/27/2016. FINDINGS: Stable severe cardiomegaly. Diffuse bilateral interstitial prominence most consistent interstitial edema again noted. Pneumonitis cannot be excluded. Underlying chronic interstitial lung disease cannot be excluded. Left base alveolar infiltrate cannot be excluded on today's exam.Previously identified mass in the right upper lobe is difficult to visualize due to overlying interstitial prominence. No prominent pleural effusion. No pneumothorax. IMPRESSION: 1. Severe cardiomegaly. Diffuse bilateral interstitial prominence most consistent with interstitial edema again noted. Similar findings on prior exam. Pneumonitis cannot be excluded. Underlying chronic interstitial lung disease can not be excluded. 2. Left base alveolar infiltrate cannot be excluded on today's exam. 3. Previously identified mass lesion right upper lung difficult to visualize due to overlying interstitial  prominence. Electronically Signed   By: Marcello Moores  Register   On: 04/18/19 09:17   CT HEAD CODE STROKE WO CONTRAST  Result Date: 04-18-2019 CLINICAL DATA:  Code stroke. Altered mental status. Left gaze and droop. Bilateral leg weakness. EXAM: CT HEAD WITHOUT CONTRAST TECHNIQUE: Contiguous axial images were obtained from the base of the skull through the vertex without intravenous contrast. COMPARISON:  CT head 03/31/2019.  MRI 10/24/2018 FINDINGS: Brain: Interval development of hydrocephalus since the prior study 4 days ago. Mild to moderate ventricular enlargement with interval development of periventricular white matter low density likely due to transependymal CSF resorption from acute hydrocephalus. Lateral ventricles are dilated. Third and fourth ventricles not significantly dilated. There is ill-defined increased density within the third ventricle compared to the prior study.  No acute hemorrhage. No midline shift. No acute ischemic infarct or mass lesion identified. Vascular: Negative for hyperdense vessel Skull: Negative Sinuses/Orbits: Mild mucosal edema right maxillary sinus. Remaining sinuses clear. Negative orbit. Other: None ASPECTS ([place] Stroke Program Early CT Score) - Ganglionic level infarction (caudate, lentiform nuclei, internal capsule, insula, M1-M3 cortex): 7 - Supraganglionic infarction (M4-M6 cortex): 3 Total score (0-10 with 10 being normal): 10 IMPRESSION: 1. Interval development of hydrocephalus since 4 days ago. Lateral ventricular enlargement with transependymal resorption of CSF. The increased density of CSF in the third ventricle compared to the prior study. The subarachnoid space has diffusely slightly increased density compared to normal without acute hemorrhage. Question meningitis, less likely mild subarachnoid hemorrhage. Recommend MRI brain without and with contrast. 2. No acute infarct identified. 3. ASPECTS is 10 4. These results were called by telephone at the time of  interpretation on 2019/04/14 at 9:34 am to provider [facility] , who verbally acknowledged these results. Electronically Signed   By: Franchot Gallo M.D.   On: 04/14/2019 09:41        Scheduled Meds: . antiseptic oral rinse  15 mL Topical BID   Continuous Infusions:   LOS: 3 days    Time spent:58min  Domenic Polite, MD [facility]  Apr 14, 2019, 2:45 PM

## 2019-04-14 NOTE — Consult Note (Signed)
Reason for Consult:hydrocephalus Referring Physician: [name] is an 69 y.o. female.   HPI:  69 year female presented to the hospital with AMS. She has a very extensive medical history including lung cancer and kidney disease. According to her daughter, she has not made it to any chemo or radiation treatments. She has also been missing her dialysis treatments. Seems to be very noncompliant. Patient is lying in the bed in distress and not communicating.   Past Medical History:  Diagnosis Date  . Abnormal Pap smear    ASCUS ?HGSIL  . Anemia   . Arthritis   . Cancer (HCC)    lung ( per daughter)  . Cervical dysplasia   . CHF (congestive heart failure) (HCC)   . Chronic kidney disease   . Constipation   . Constipation   . Dyspnea   . GERD (gastroesophageal reflux disease)   . Gout   . Hyperlipidemia   . Hypertension   . Myocardial infarction (HCC) 2015  . Obesity   . Pneumonia   . Uterine polyp     Past Surgical History:  Procedure Laterality Date  . AV FISTULA PLACEMENT  01/18/2012   Procedure: ARTERIOVENOUS (AV) FISTULA CREATION;  Surgeon: Chuck Hint, MD;  Location[facility] OR;  Service: Vascular;  Laterality: Left;  . BIOPSY  10/20/2018   Procedure: BIOPSY;  Surgeon: Tressia Danas, MD;  Location: [facility] ENDOSCOPY;  Service: Gastroenterology;;  . COLONOSCOPY WITH PROPOFOL N/A 10/20/2018   Procedure: COLONOSCOPY WITH PROPOFOL;  Surgeon: Tressia Danas, MD;  [facility] ENDOSCOPY;  Service: Gastroenterology;  Laterality: N/A;  . DILATION AND CURETTAGE OF UTERUS    . HYSTEROSCOPY    . LEEP    . LEFT HEART CATHETERIZATION WITH CORONARY ANGIOGRAM N/A 08/26/2013   Procedure: LEFT HEART CATHETERIZATION WITH CORONARY ANGIOGRAM;  Surgeon: Marykay Lex, MD;  [facility];  Service: Cardiovascular;  Laterality: N/A;  . POLYPECTOMY  10/20/2018   Procedure: POLYPECTOMY;  Surgeon: Tressia Danas, MD;  Location: [facility] ENDOSCOPY;  Service: Gastroenterology;;  .  REVISON OF ARTERIOVENOUS FISTULA Left 03/14/2016   Procedure: PLICATION OF ARTERIOVENOUS FISTULA;  Surgeon: Fransisco Hertz, MD;  Location: [facility] OR;  Service: Vascular;  Laterality: Left;  Marland Kitchen VIDEO BRONCHOSCOPY WITH ENDOBRONCHIAL ULTRASOUND N/A 12/19/2018   Procedure: VIDEO BRONCHOSCOPY WITH ENDOBRONCHIAL ULTRASOUND;  Surgeon: Josephine Igo, DO;  Location: [facility] OR;  Service: Thoracic;  Laterality: N/A;    No Known Allergies  Social History   Tobacco Use  . Smoking status: Current Every Day Smoker    Packs/day: 0.25    Years: 30.00    Pack years: 7.50    Types: Cigarettes  . Smokeless tobacco: Never Used  . Tobacco comment: smoking cessation  Substance Use Topics  . Alcohol use: Not Currently    Family History  Problem Relation Age of Onset  . Esophageal cancer Other   . Pancreatic cancer Other   . Heart disease Mother   . Hypertension Mother   . Breast cancer Sister   . Esophageal cancer Sister   . Cancer Sister   . Deep vein thrombosis Sister   . Diabetes Sister   . Hypertension Sister   . Hypertension Daughter   . Colon cancer Neg Hx      Review of Systems  Positive ROS: AMS  All other systems have been reviewed and were otherwise negative with the exception of those mentioned in the HPI and as above.  Objective: Vital signs in last 24  hours: Temp:  [97.4 F (36.3 C)-98.9 F (37.2 C)] 97.4 F (36.3 C) (02/05 1152) Pulse Rate:  [73-83] 83 (02/05 0948) Resp:  [19-49] 37 (02/05 1152) BP: (115-138)/(65-93) 121/93 (02/05 1152) SpO2:  [95 %-100 %] 100 % (02/05 0948)  General Appearance: noncooperative, in distress Head: Normocephalic, without obvious abnormality, atraumatic Eyes: PERRL, conjunctiva/corneas clear, EOM's intact, fundi benign, both eyes      Lungs: respirations labored Heart: Regular rate and rhythm  NEUROLOGIC:   Mental status: obtunded and not interactive  Motor Exam - uta Sensory Exam - uta Reflexes: not tested Coordination - uta Gait -  uta Balance -uta Cranial Nerves: I: smell Not tested  II: visual acuity  OS: na    OD: na  II: visual fields fixed  II: pupils Equal, round, reactive to light  III,VII: ptosis   III,IV,VI: extraocular muscles    V: mastication   V: facial light touch sensation    V,VII: corneal reflex    VII: facial muscle function - upper    VII: facial muscle function - lower   VIII: hearing   IX: soft palate elevation    IX,X: gag reflex   XI: trapezius strength    XI: sternocleidomastoid strength   XI: neck flexion strength    XII: tongue strength      Data Review Lab Results  Component Value Date   WBC 12.9 (H) 03/20/2019   HGB 10.8 (L) 03/20/2019   HCT 35.0 (L) 03/20/2019   MCV 100.3 (H) 03/20/2019   PLT 214 03/20/2019   Lab Results  Component Value Date   NA 134 (L) 03/20/2019   K 3.6 03/20/2019   CL 87 (L) 03/20/2019   CO2 14 (L) 03/20/2019   BUN 31 (H) 03/20/2019   CREATININE 4.78 (H) 03/20/2019   GLUCOSE 97 03/20/2019   Lab Results  Component Value Date   INR 1.2 12/19/2018    Radiology: DG CHEST PORT 1 VIEW  Result Date: 04-06-2019 CLINICAL DATA:  Shortness of breath.  Altered mental status. EXAM: PORTABLE CHEST 1 VIEW COMPARISON:  CT 04/07/2019.  Chest x-ray 03/23/2019, 08/27/2016. FINDINGS: Stable severe cardiomegaly. Diffuse bilateral interstitial prominence most consistent interstitial edema again noted. Pneumonitis cannot be excluded. Underlying chronic interstitial lung disease cannot be excluded. Left base alveolar infiltrate cannot be excluded on today's exam.Previously identified mass in the right upper lobe is difficult to visualize due to overlying interstitial prominence. No prominent pleural effusion. No pneumothorax. IMPRESSION: 1. Severe cardiomegaly. Diffuse bilateral interstitial prominence most consistent with interstitial edema again noted. Similar findings on prior exam. Pneumonitis cannot be excluded. Underlying chronic interstitial lung disease can not  be excluded. 2. Left base alveolar infiltrate cannot be excluded on today's exam. 3. Previously identified mass lesion right upper lung difficult to visualize due to overlying interstitial prominence. Electronically Signed   By: Maisie Fus  Register   On: 2019/04/06 09:17   CT HEAD CODE STROKE WO CONTRAST  Result Date: 04/06/2019 CLINICAL DATA:  Code stroke. Altered mental status. Left gaze and droop. Bilateral leg weakness. EXAM: CT HEAD WITHOUT CONTRAST TECHNIQUE: Contiguous axial images were obtained from the base of the skull through the vertex without intravenous contrast. COMPARISON:  CT head 04/08/2019.  MRI 10/24/2018 FINDINGS: Brain: Interval development of hydrocephalus since the prior study 4 days ago. Mild to moderate ventricular enlargement with interval development of periventricular white matter low density likely due to transependymal CSF resorption from acute hydrocephalus. Lateral ventricles are dilated. Third and fourth ventricles not significantly dilated.  There is ill-defined increased density within the third ventricle compared to the prior study. No acute hemorrhage. No midline shift. No acute ischemic infarct or mass lesion identified. Vascular: Negative for hyperdense vessel Skull: Negative Sinuses/Orbits: Mild mucosal edema right maxillary sinus. Remaining sinuses clear. Negative orbit. Other: None ASPECTS ([place]berta Stroke Program Early CT Score) - Ganglionic level infarction (caudate, lentiform nuclei, internal capsule, insula, M1-M3 cortex): 7 - Supraganglionic infarction (M4-M6 cortex): 3 Total score (0-10 with 10 being normal): 10 IMPRESSION: 1. Interval development of hydrocephalus since 4 days ago. Lateral ventricular enlargement with transependymal resorption of CSF. The increased density of CSF in the third ventricle compared to the prior study. The subarachnoid space has diffusely slightly increased density compared to normal without acute hemorrhage. Question meningitis, less likely  mild subarachnoid hemorrhage. Recommend MRI brain without and with contrast. 2. No acute infarct identified. 3. ASPECTS is 10 4. These results were called by telephone at the time of interpretation on 04/02/2019 at 9:34 am to provider ERIC [facility] , who verbally acknowledged these results. Electronically Signed   By: Marlan Palau M.D.   On: 04/11/2019 09:41    Assessment/Plan: 69 year old with extensive medical history whose CT head shows hydrocephalus which has changed from her scan 4 days ago. The patient had an acute change in her mental status this morning. We were called to consider ventriculostomy placement to treat hydrocephalus and obtain CSF studies.   Differential diagnosis includes carcinomatous meningitis, infectious meningitis, mechanical obstruction (no obvious source seen on CT) vs other. All of which add to an extremely poor prognosis  We have had a long discussion with the daughter regarding her current condition and her prognosis. We have recommended comfort care and the daughter and her father are in complete agreement. They state "she is ready."   We have spoken with palliative care and primary team and they will initiate comfort care measures.   Jessica Clarke 04/03/2019 12:07 PM

## 2019-04-14 NOTE — TOC Progression Note (Signed)
Transition of Care [facility]) - Progression Note    Patient Details  Name: Alayziah Tangeman MRN: [id number] Date of Birth: February 07, 1951  Transition of Care [facility]) CM/SW Contact  Sharlet Salina Mila Homer, LCSW Phone Number: 04-02-19, 5:16 PM    Late Entry Clinical Narrative: Prior to patient's transfer to 4NP, CSW was working on SNF placement for patient. [facility], family's choice unable to accept patient due to her appointments (HD and cancer tx appts). CSW advised that patient not doing well during morning progression rounds and soon after was transferred to current unit. Daughter contacted earlier today by [place] regarding move and 45M nurse provided daughter with general update and informed that MD would be contacting her. CSW noted that patient is now comfort care.      Expected Discharge Plan: [place] Barriers to Discharge: Continued Medical Work up  Expected Discharge Plan and Services Expected Discharge Plan: [place] In-house Referral: Hospice / Palliative Care, Clinical Social Work Discharge Planning Services: CM Consult Post Acute Care Choice: [place] Living arrangements for the past 2 months: Apartment                 DME Arranged: N/A DME Agency: NA       HH Arranged: NA           Social Determinants of Health (SDOH) Interventions  None needed  Readmission Risk Interventions No flowsheet data found.

## 2019-04-14 NOTE — Progress Notes (Addendum)
Pharmacy Antibiotic Note  Jessica Clarke is a 69 y.o. female admitted on 03/24/2019 with meningitis.  Pharmacy has been consulted for vancomycin, ceftriaxone, acyclovir and ampicillin dosing.  Per discussion with MD, patient is eldery, frail and on HD MWF with AMS, headache, WBC up to 12.9, CT negative for CVA but positive for new hydrocephalus. Overall highly suspicious for meningitis. Patient is at risk for listeria and nosocomial pathogens hence the broad 4 drug empiric regimen.  Patient is also positive for Cdiff and on PO vancomycin.  Plan: Start vancomycin load 2g then 750mg  post HD (higher dose for meningitis) Order vancomycin level pre-HD on 2/10 if still on vanc Start Ceftriaxone 2g Q12 hr Start Ampicillin 2g Q12 hr Start acyclovir 5mg /kg (= 300mg ) Q24hr  Continue PO vancomycin for Cdiff. If unable to take PO, convert to vancomycin enema  Monitor cultures, clinical status, renal fx Narrow abx as able and f/u duration     Height: 5\' 11"  (180.3 cm) Weight: 136 lb 7.4 oz (61.9 kg) IBW/kg (Calculated) : 70.8  Temp (24hrs), Avg:98.3 F (36.8 C), Min:97.7 F (36.5 C), Max:98.9 F (37.2 C)  Recent Labs  Lab 03/24/2019 0401 04/13/2019 0402 03/26/2019 0613 03/22/2019 1922 04/05/2019 1938 03/18/19 0401 03/19/19 0456 03/19/19 0801 03/20/19 1233  WBC  --  9.4  --  7.8  --  9.3  --  8.6 12.9*  CREATININE  --  7.18*  --  4.94*  --  5.33* 6.16*  --  4.78*  LATICACIDVEN 4.4*  --  3.0*  --  1.7  --   --   --   --     Estimated Creatinine Clearance: 10.7 mL/min (A) (by C-G formula based on SCr of 4.78 mg/dL (H)).    No Known Allergies  Antimicrobials this admission: Vancomycin 2/5 >>   Ceftriaxone 2/5 >>  Ampicillin 2/5 >>  Acyclovir  2/5 >>  PO Vancomycin 2/3 >>  Microbiology results: 2/3 Cdiff +  2/3 BCx: ngtd 2/3 Covid neg    Thank you for allowing pharmacy to be a part of this patient's care.  Benetta Spar, PharmD, BCPS, BCCP Clinical Pharmacist  Please check AMION for all  [place] phone numbers After 10:00 PM, call [place] [phone]

## 2019-04-14 NOTE — Procedures (Signed)
Patient Name: Jessica Clarke  MRN: [id number]  Epilepsy Attending: Lora Havens  Referring Physician/Provider: Dr. Kerney Elbe Date: 03/24/2019 Duration: 22.35 minutes  Patient history: 69 year old female who presented with altered mental status.  CT head concerning for worsening hydrocephalus.  Patient eyes were noted to be deviated upwards concerning for seizures.  EEG to evaluate for seizures.  Level of alertness: Lethargic  AEDs during EEG study: Lorazepam  Technical aspects: This EEG study was done with scalp electrodes positioned according to the 10-20 International system of electrode placement. Electrical activity was acquired at a sampling rate of 500Hz  and reviewed with a high frequency filter of 70Hz  and a low frequency filter of 1Hz . EEG data were recorded continuously and digitally stored.   Description: EEG showed continuous generalized polymorphic 3 to 6 Hz theta-delta slowing, at times with triphasic morphology.  EEG was reactive to tactile stimulation.  Hyperventilation photic summation were not performed due to altered mental status.  Abnormality -Continuous slow, generalized  IMPRESSION: This study is suggestive of severe diffuse encephalopathy, nonspecific etiology. No seizures or epileptiform discharges were seen throughout the recording.  [name] [name] [name]

## 2019-04-14 NOTE — Progress Notes (Signed)
Patient remained awake all night long.This would be her second night not sleeping at all. Alprazolam given which made her calm. Patient had multiple bouts of diarrhea. Kept dry and comfortable. Patient is alert to self only.

## 2019-04-14 NOTE — Progress Notes (Signed)
EEG complete - results pending 

## 2019-04-14 NOTE — Progress Notes (Addendum)
Daily Progress Note   Patient Name: Jessica Clarke       Date: 2019/04/01 DOB: 06-11-1950  Age: 69 y.o. MRN#: [id number] Attending Physician: Domenic Polite, MD Primary Care Physician: [name], FNP Admit Date: 03/22/2019  Reason for Consultation/Follow-up: Establishing goals of care  Subjective: Patient not responsive, upwards gaze, breathing somewhat labored with tachypnea.  Length of Stay: 3  Current Medications: Scheduled Meds:  . antiseptic oral rinse  15 mL Topical BID    Continuous Infusions:   PRN Meds:   Physical Exam Constitutional:      General: She is in acute distress.     Appearance: She is underweight. She is ill-appearing.  HENT:     Head: Normocephalic and atraumatic.  Cardiovascular:     Rate and Rhythm: Normal rate.  Pulmonary:     Effort: Tachypnea and respiratory distress present.  Abdominal:     Palpations: Abdomen is soft.  Skin:    General: Skin is warm and dry.  Neurological:     Comments: Does not respond to verbal or physical stimulation             Vital Signs: BP (!) 121/93 (BP Location: Right Arm)   Pulse 84   Temp (!) 97.4 F (36.3 C) (Rectal)   Resp (!) 37   Ht 5\' 11"  (1.803 m)   Wt 61.9 kg   SpO2 (!) 0% Comment: not monitored  BMI 19.03 kg/m  SpO2: SpO2: (!) 0 %(not monitored) O2 Device: O2 Device: NRB O2 Flow Rate: O2 Flow Rate (L/min): 15 L/min  Intake/output summary:   Intake/Output Summary (Last 24 hours) at 04/01/2019 1622 Last data filed at Apr 01, 2019 0600 Gross per 24 hour  Intake 360 ml  Output 0 ml  Net 360 ml   LBM: Last BM Date: 04/01/2019 Baseline Weight: Weight: 67.2 kg Most recent weight: Weight: 61.9 kg       Palliative Assessment/Data: PPS 10%    Flowsheet Rows     Most Recent Value  Intake Tab  Referral  Department  Hospitalist  Unit at Time of Referral  Cardiac/Telemetry Unit  Palliative Care Primary Diagnosis  Cancer  Date Notified  03/18/19  Palliative Care Type  New Palliative care  Reason for referral  Clarify Goals of Care  Date of Admission  03/27/2019  Date first seen by Palliative Care  03/18/19  # of days Palliative referral response time  0 Day(s)  # of days IP prior to Palliative referral  1  Clinical Assessment  Palliative Performance Scale Score  40%  Psychosocial & Spiritual Assessment  Palliative Care Outcomes  Patient/Family meeting held?  Yes  Who was at the meeting?  patient  Palliative Care Outcomes  Changed CPR status, Provided psychosocial or spiritual support, Clarified goals of care      Patient Active Problem List   Diagnosis Date Noted  . DNR (do not resuscitate)   . Altered mental status 03/18/2019  . Thrombocytopenia ([place]) 03/18/2019  . Palliative care by specialist   . Encounter for antineoplastic chemotherapy 03/11/2019  . Acute pulmonary edema ([place]) 02/05/2019  . Enteritis 02/05/2019  . Acute respiratory failure with hypoxia ([place]) 02/05/2019  . Adenocarcinoma of right lung,  stage 3 ([place]) 12/26/2018  . Goals of care, counseling/discussion 12/26/2018  . Mediastinal adenopathy 12/19/2018  . Lung mass 11/05/2018  . Polyp of colon   . Diarrhea 10/18/2018  . Rectal bleeding 10/18/2018  . Symptomatic anemia 10/17/2018  . Lung nodule < 6cm on CT 03/05/2018  . Malnutrition of moderate degree 08/28/2016  . Respiratory distress 08/26/2016  . ESRD (end stage renal disease) on dialysis ([place]) 06/08/2015  . CAD- non obstructive RI disease 08/26/13 08/27/2013  . NSTEMI-08/25/13- non obstructive CAD at cath 08/26/13 08/26/2013  . Chronic combined systolic and diastolic heart failure ([place]) 08/26/2013  . Alcohol abuse 08/25/2013  . Respiratory failure with hypoxia and hypercapnia ([place]) 08/25/2013  . Flash pulmonary edema ([place]) 08/25/2013  . Encounter for  adequacy testing for hemodialysis ([place]) 06/26/2012  . End stage renal disease ([place]) 12/27/2011  . ASCUS (atypical squamous cells of undetermined significance) on Pap smear 09/09/2010  . Hypertension 09/09/2010  . Cervical polyp 09/09/2010  . Smoker 09/09/2010  . Post-menopausal bleeding 09/09/2010  . Gout 09/09/2010    Palliative Care Assessment & Plan   HPI: 69 y.o. female  with past medical history of HTN, HLD, ESRD on HD, CAD, NSTEMI, CHF, and lung cancer admitted on 03/26/2019 with headache, AMS, and unwitnessed fall.  Found to have oxygen desaturation and required supplemental oxygenation. Acute respiratory failure secondary to pulmonary edema vs lung cancer. EEG with no seizure activity, suggestive of mild to moderate encephalopathy. Patient has missed all radiation and chemotherapy appointments so far. C diff positive. PMT consulted to assist with [place].   Assessment: When I arrived to see patient - neurosurgery had just discussed situation with daughter and options for treatment. Daughter is aware of poor prognosis and has made wishes known to focus on her mothers comfort.  Called and spoke with [name], reviewed conversation with neurosurgery. She confirms desire to transition to full comfort care - we discussed what these measures include. She plans to come visit this afternoon. All questions and concerns addressed. Encouraged to call with any further questions.   Recommendations/Plan:  Transitioned to full comfort care  PRN meds added for comfort   ADDENDUM: Called by RN about inadequate control of symptoms with current regimen (continued tachypnea/labored breathing), very little relief with PRN dilaudid. Daughter at bedside concerned patient is uncomfortable and requesting additional measures for comfort. Will order dilaudid infusion with increased dose and boluses as needed. Advised RN to give dose of ativan now.   Code Status:  DNR  Prognosis:   hours-days  Discharge  Planning:  Anticipated Hospital Death  Care plan was discussed with daughter, RN, Dr. Broadus John  Thank you for allowing the Palliative Medicine Team to assist in the care of this patient.   Total Time 35 minutes Prolonged Time Billed  no      Greater than 50%  of this time was spent counseling and coordinating care related to the above assessment and plan.  Juel Burrow, DNP, [facility] Palliative Medicine Team Team Phone # [phone]  Pager [phone]

## 2019-04-14 NOTE — Progress Notes (Signed)
The chaplain visited with family as patient is actively dying.  The chaplain offered support and passed this consult on to the on-call chaplain.  Brion Aliment Chaplain Resident For questions concerning this note please contact me by pager [phone]

## 2019-04-14 NOTE — Significant Event (Signed)
Rapid Response Event Note  Overview: Time Called: 1660 Arrival Time: 0850 Event Type: Neurologic Called for pt having a change in mentation, minimally responsive. Eyes are open, pt has an upward, left gaze.  VS at 0900: RR 40, SpO2 98% on 5LNC. HR 80s. SBP 120s.   Initial Focused Assessment: Pt lying in bed, eyes looking upward and to the left. No tremor or seizure activity noted. JVD present. Lung sounds are rhonchus in all lung fields. PERRLA, 73mm. Unable to follow commands. Withdrawals to painful stimuli in all extremities. Left lip facial droop noted. No speech, pt suctioned to clear oral secretions.   Interventions: CXR ABG Code stroke called: CT completed Dr. Cheral Marker ordered: EEG, MRI brain  See stroke flowsheet for assessment and intervention times.   Plan of Care (if not transferred): Transfer to Progressive care: Q2H Neuro checks, MRI, antibiotics as ordered.   Event Summary: Name of Physician Notified: Dr. Broadus John at [phone]    at    Outcome: Transferred (Comment) Progressive Care  Event End Time: [place]

## 2019-04-14 NOTE — Chronic Care Management (AMB) (Signed)
  Care Management    Consult Note  03/22/19 Name: Jessica Clarke MRN: [id number] DOB: February 10, 1951  SW received communication from RN Case Manager of patient's daughter/caregiver, Fredrica Capano desire to receive assistance with obtaining healthcare power of attorney. Outbound call placed to Erven Colla to assist with care coordination needs.  The patient is currently inpatient with an inability to make own medical or financial decisions. [name] reports her mother has never completed a power of attorney form and she is concerned how she will be able to access accounts for future patient needs. Thompson Caul on how to initiate the process to file for guardianship. SW also discussed the importance of contacting the register of deeds office for instruction with financial and estate planning questions.  Plan: Embedded care coordination team will continue to follow patient progress and assist with care coordination needs post discharge.   Daneen Schick, BSW, CDP Social Worker, Certified Dementia Practitioner [place] [facility] [place] [facility] [phone]

## 2019-04-14 NOTE — Chronic Care Management (AMB) (Signed)
  Chronic Care Management   Follow Up Note   March 23, 2019 Name: Jessica Clarke MRN: [id number] DOB: February 29, 1950  Referred by: [name], FNP Reason for referral : Chronic Care Management (CCM RNCM Telephone Inbound Call )   Jessica Clarke is a 69 y.o. year old female who is a primary care patient of [name], [place]. The CCM team was consulted for assistance with chronic disease management and care coordination needs.    Review of patient status, including review of consultants reports, relevant laboratory and other test results, and collaboration with appropriate care team members and the patient's provider was performed as part of comprehensive patient evaluation and provision of chronic care management services.    Inbound call received from daughter Katara Griner to advise her mother is IP and not doing well. Offered support to daughter and discussed [name] was unable to complete her Advanced Directives or financial POA before her health declined. Provided daughter with guidance on how to obtain legal POA if desired. Discussed the patient may be transferred to a SNF if her symptoms improve, however [name] believes she may not leave the hospital due to her worsening neurological and respiratory symptoms.    Plan:   Telephone follow up appointment with care management team member scheduled for:  03/24/19   Barb Merino, RN, BSN, CCM Care Management Coordinator [facility] [facility]   Direct Phone: [phone]

## 2019-04-14 NOTE — Discharge Summary (Signed)
Death Summary  Jessica Clarke LYY:[id number] DOB: [birth year] DOA: 04/15/19  PCP: [name], FNP  Admit date: 04-15-2019 Date of Death: 19-Apr-2019  Final Diagnoses:  Metabolic encephalopathy Acute hydrocephalus   End stage renal disease (HCC)   CAD- non obstructive RI disease 08/26/13   Adenocarcinoma of right lung, stage 3b/4 (HCC)   Acute respiratory failure with hypoxia (HCC)   Thrombocytopenia ([place]) Severe protein calorie malnutrition   Palliative care by specialist   DNR (do not resuscitate)   Comfort measures only status   History of present illness:  Jessica Clarke a 69 y.o.femalewith medical history significantofESRD on hemodialysis, CAD s/p NSTEMI, combined CHF, HTN, stage IIIb/IV adenocarcinoma of right lung, originally diagnosed in 01/2018, lost to follow-up until 10/2018 , has missed several follow-up appointments with oncology and XRT , has not started chemo or radiation yet  -Was seen in the emergency room 2 days ago following a fall and 1-2 episodes of vomiting, she was discharged back home after negative work-up on Phenergan and Compazine  -She was sent to the emergency room 2/1 after dialysis secondary to some mental status changes, disorientation  she was also noted to be hypoxemic  Hospital Course:  Jessica Clarke a 69 y.o.femalewith medical history significantofESRD on hemodialysis, CAD s/p NSTEMI, combined CHF, HTN, stage IIIb/IV adenocarcinoma of right lung, originally diagnosed in 01/2018, lost to follow-up until 10/2018 , has missed several follow-up appointments with oncology and XRT , has not started chemo or radiation yet  -Was seen in the emergency room 2 days ago following a fall and 1-2 episodes of vomiting, she was discharged back home after negative work-up on Phenergan and Compazine  -She was sent to the emergency room 2/1 after dialysis secondary to some mental status changes, disorientation  she was also noted to be hypoxemic, upon arrival in the  emergency room she was mostly nonverbal and unable to provide any history, per family she been missing dialysis due to feeling unwell, daughter reports she has been sick most of the time, unable to keep her chemo or radiation appointments. -Mental status has improved considerably after admission, but still has mild confusion, CT scan on admission noted mild atrophy only -C. difficile PCR and toxin positive, started on vancomycin -Palliative medicine consulted, overall prognosis is poor, now DNR, plan was for palliative care follow-up at SNF -On 2/5 AM developed acute mental status changes, CT head noted acute hydrocephalus, this was not seen on the original CT 3 days ago, we suspect she may have metastasis not well visualized on original CT versus carcinomatous meningitis, seen by neurology and subsequently neurosurgery, they did not feel she would be a good candidate for ventriculostomy, Dr. Ronnald Ramp neurosurgery discussed this with patient's daughter and ex-husband, they decided to pursue comfort care -Also followed by palliative care this admission, started on comfort measures and subsequently expired    Time: 3min  Signed:  [facility] 04/02/2019, 3:31 PM

## 2019-04-14 NOTE — Progress Notes (Signed)
Called to patient's room because patient had gurgling sounds with respirations.  Pt noted to have AMS, unable to obtain o2 saturations, and foamy secretions coming from her mouth.  Pt NT suctioned x 3 for moderate amount of frothy white/tan secretions.  Patient placed on NRB at this time until we can obtain o2 saturations.

## 2019-04-14 NOTE — Consult Note (Addendum)
NEURO HOSPITALIST CONSULT NOTE   Requestig physician: Dr. Broadus John  Reason for Consult: AMS  History obtained from:  Chart     HPI:                                                                                                                                          Jessica Clarke is an 69 y.o. female who initially presented to [facility] on 2/2 for headache and AMS. She has a complicated PMHx including CAD s/p NSTEMI, combined CHF, HTN, adenocarcinoma of the right lung with planned chemotherapy and radiation, prior flash pulmonary edema, and ESRD (on HD M/W/F).    The patient had fallen out of bed on 2/1 in the early AM and had vomited and soiled herself. Daughter called EMS and initial work up on arrival to [facility] revealed negative CT head and cervical spine. There was initial concern for possible seizure with postictal state in the setting of her AMS, but CK and lactate were 44 and 3, respectively. CXR showed fluid overload but she had no oxygen requirement. She was discharged home. Subsequently she went to dialysis and at about 1430, she had a headache and seemed disoriented - daughter then requested transport to the ED for further evaluation.   She was afebrile and normotensive on arrival to the ED on 2/1, but had oxygen desaturation requiring O2 supplementation. She was mostly nonverbal on initial assessment. At baseline, the patient can engage in conversation and can do most of her ADLs. Of note, she has persistent SOB at home and uses her daughter's oxygen at times to relieve this symptom. The patient has been missing dialysis sessions, at least once per week due to noncompliance issues.   She uses tobacco but denies illicit drug or EtOH use.   Past Medical History:  Diagnosis Date  . Abnormal Pap smear    ASCUS ?HGSIL  . Anemia   . Arthritis   . Cancer ([place])    lung ( per daughter)  . Cervical dysplasia   . CHF (congestive heart failure) ([place])   . Chronic kidney disease    . Constipation   . Constipation   . Dyspnea   . GERD (gastroesophageal reflux disease)   . Gout   . Hyperlipidemia   . Hypertension   . Myocardial infarction ([place]) 2015  . Obesity   . Pneumonia   . Uterine polyp     Past Surgical History:  Procedure Laterality Date  . AV FISTULA PLACEMENT  01/18/2012   Procedure: ARTERIOVENOUS (AV) FISTULA CREATION;  Surgeon: Angelia Mould, MD;  Location: [place];  Service: Vascular;  Laterality: Left;  . BIOPSY  10/20/2018   Procedure: BIOPSY;  Surgeon: Thornton Park, MD;  Location: [place];  Service: Gastroenterology;;  . COLONOSCOPY WITH  PROPOFOL N/A 10/20/2018   Procedure: COLONOSCOPY WITH PROPOFOL;  Surgeon: Thornton Park, MD;  Location: [place];  Service: Gastroenterology;  Laterality: N/A;  . DILATION AND CURETTAGE OF UTERUS    . HYSTEROSCOPY    . LEEP    . LEFT HEART CATHETERIZATION WITH CORONARY ANGIOGRAM N/A 08/26/2013   Procedure: LEFT HEART CATHETERIZATION WITH CORONARY ANGIOGRAM;  Surgeon: Leonie Man, MD;  Location[facility] C[facility] LAB;  Service: Cardiovascular;  Laterality: N/A;  . POLYPECTOMY  10/20/2018   Procedure: POLYPECTOMY;  Surgeon: Thornton Park, MD;  Location: [facility] ENDOSCOPY;  Service: Gastroenterology;;  . REVISON OF ARTERIOVENOUS FISTULA Left 08/13/1599   Procedure: PLICATION OF ARTERIOVENOUS FISTULA;  Surgeon: Conrad [place], MD;  Location: [place];  Service: Vascular;  Laterality: Left;  Marland Kitchen VIDEO BRONCHOSCOPY WITH ENDOBRONCHIAL ULTRASOUND N/A 12/19/2018   Procedure: VIDEO BRONCHOSCOPY WITH ENDOBRONCHIAL ULTRASOUND;  Surgeon: Garner Nash, DO;  Location: [facility] OR;  Service: Thoracic;  Laterality: N/A;    Family History  Problem Relation Age of Onset  . Esophageal cancer Other   . Pancreatic cancer Other   . Heart disease Mother   . Hypertension Mother   . Breast cancer Sister   . Esophageal cancer Sister   . Cancer Sister   . Deep vein thrombosis Sister   . Diabetes Sister   . Hypertension Sister    . Hypertension Daughter   . Colon cancer Neg Hx              Social History:  reports that she has been smoking cigarettes. She has a 7.50 pack-year smoking history. She has never used smokeless tobacco. She reports previous alcohol use. She reports that she does not use drugs.  No Known Allergies  MEDICATIONS:                                                                                                                     Scheduled: . allopurinol  100 mg Oral Daily  . atorvastatin  10 mg Oral q1800  . calcitRIOL  2 mcg Oral Q M,W,F-HD  . cinacalcet  30 mg Oral Q breakfast  . enoxaparin (LOVENOX) injection  30 mg Subcutaneous Daily  . LORazepam  0.5 mg Intravenous Once  . midodrine  5 mg Oral BID WC  . thiamine  100 mg Oral Daily  . vancomycin  125 mg Oral Q6H   Continuous: . acyclovir    . ampicillin (OMNIPEN) IV    . cefTRIAXone (ROCEPHIN)  IV    . [START ON 03/24/2019] vancomycin    . vancomycin       ROS:  Unable to obtain due to AMS.    Blood pressure 138/80, pulse 80, temperature 98.9 F (37.2 C), temperature source Oral, resp. rate 19, height 5\' 11"  (1.803 m), weight 61.9 kg, SpO2 95 %.   General Examination:                                                                                                       Physical Exam  HEENT-  [place]/AT. Nuchal rigidity present in flexion but neck is supple with lateral rotation.  Lungs- Intermittent increased respiratory rate with apparent increased WOB Extremities- No edema. Patchy depigmentation and skin thickening BUE. Fistula noted to LUE.   Neurological Examination Mental Status: Obtunded with eyes open and unresponsive/nonverbal except for groaning to noxious. Does not follow any verbal commands. Stares straight ahead and does not move eyes towards or away from visual stimuli. Will localize  semipurposefully to sternal rub with BUE.  Cranial Nerves: II: No blink to threat bilaterally. PERRL.  III,IV, VI: Eyes are open spontaneously. Eyes conjugately at the midline. Stares straight ahead and does not move eyes towards or away from visual stimuli. Can be partially overcome with oculocephalic maneuver - eyes cross to left and right with oculocephalic. No nystagmus.  V,VII: No definite asymmetry with weak grimace to noxious. Blinks to eyelash stimulation bilaterally.   VIII: Not responding to voice IX,X: Unable to visualize palate.  XI: Head is midline.  XII: Unable to assess due to AMS.  Motor/Sensory: BUE: Flexes equally when localizing semipurposefully to sternal rub, with 4+/5 strength. Tone initially flaccid on the right with increased extensor tone on the left. Reacts to noxious applied to each arm.  BLE: 3/5 withdrawal bilaterally to noxious plantar stimulation. Tone is decreased bilaterally. Unable to perform more detailed exam due to obtundation.  Deep Tendon Reflexes: Brisk, low amplitude brachioradialis, biceps and patellar reflexes bilaterally. Plantars: Upgoing bilaterally  Cerebellar/Gait: Unable to assess   Lab Results: Basic Metabolic Panel: Recent Labs  Lab 03/20/2019 0402 03/29/2019 0402 04/01/2019 1922 04/10/2019 1922 03/18/19 0401 03/19/19 0456 03/20/19 1233  NA 133*  --  135  --  134* 131* 134*  K 3.5  --  3.3*  --  4.2 4.3 3.6  CL 90*  --  92*  --  93* 87* 87*  CO2 20*  --  28  --  24 23 14*  GLUCOSE 128*  --  94  --  81 76 97  BUN 43*  --  23  --  29* 39* 31*  CREATININE 7.18*  --  4.94*  --  5.33* 6.16* 4.78*  CALCIUM 9.0   < > 9.1   < > 8.9 9.1 8.9  MG 2.0  --   --   --   --   --   --    < > = values in this interval not displayed.    CBC: Recent Labs  Lab 04/13/2019 0402 04/04/2019 1922 03/18/19 0401 03/19/19 0801 03/20/19 1233  WBC 9.4 7.8 9.3 8.6 12.9*  NEUTROABS 7.8* 6.6  --   --   --  HGB 10.0* 12.8 9.7* 9.7* 10.8*  HCT 31.0* 40.3 30.2*  29.8* 35.0*  MCV 96.3 96.0 95.6 95.5 100.3*  PLT 183 PLATELET CLUMPS NOTED ON SMEAR, UNABLE TO ESTIMATE 156 180 214    Cardiac Enzymes: Recent Labs  Lab 03/30/2019 0402  CKTOTAL 40    Lipid Panel: No results for input(s): CHOL, TRIG, HDL, CHOLHDL, VLDL, LDLCALC in the last 168 hours.  Imaging: DG CHEST PORT 1 VIEW  Result Date: 2019-03-29 CLINICAL DATA:  Shortness of breath.  Altered mental status. EXAM: PORTABLE CHEST 1 VIEW COMPARISON:  CT 03/30/2019.  Chest x-ray 03/19/2019, 08/27/2016. FINDINGS: Stable severe cardiomegaly. Diffuse bilateral interstitial prominence most consistent interstitial edema again noted. Pneumonitis cannot be excluded. Underlying chronic interstitial lung disease cannot be excluded. Left base alveolar infiltrate cannot be excluded on today's exam.Previously identified mass in the right upper lobe is difficult to visualize due to overlying interstitial prominence. No prominent pleural effusion. No pneumothorax. IMPRESSION: 1. Severe cardiomegaly. Diffuse bilateral interstitial prominence most consistent with interstitial edema again noted. Similar findings on prior exam. Pneumonitis cannot be excluded. Underlying chronic interstitial lung disease can not be excluded. 2. Left base alveolar infiltrate cannot be excluded on today's exam. 3. Previously identified mass lesion right upper lung difficult to visualize due to overlying interstitial prominence. Electronically Signed   By: Grandfather   On: 2019/03/29 09:17   CT head, 2/5: 1. Interval development of hydrocephalus since 4 days ago. Lateral ventricular enlargement with transependymal resorption of CSF. The increased density of CSF in the third ventricle compared to the prior study. The subarachnoid space has diffusely slightly increased density compared to normal without acute hemorrhage. Question meningitis, less likely mild subarachnoid hemorrhage. Recommend MRI brain without contrast. 2. No acute  infarct identified. 3. ASPECTS is 10  Assessment: 69 year old female with progressively worsening AMS. Initially seen as a Code Stroke for possible left facial droop, her exam shows no asymmetry and is more consistent with a severe diffuse encephalopathy.  1. Exam reveals a severely encephalopathic patient with nuchal rigidity in flexion as well as evidence for increased WOB. No lateralized weakness is noted. Initial assessment by Stroke RN revealed leftward gaze deviation suggestive of possible seizure activity.  2. CT head shows no acute hemorrhage or hypodensity. However, the ventricles have increased in size relative to most recent prior CT on 2/1 and there is also some blurring of the cortical sulci. These findings are suggestive of possible meningitis. In the context of nuchal rigidity as well as initial presentation to the hospital with symptoms of headache and AMS, meningitis (bacterial, fungal or aseptic) is high enough on the DDx to warrant STAT initiation of empiric antibiotics, followed by LP and ID consult.  3. AMS in conjunction with some tremulousness seen on exam also suggests possible subclinical status.  4. Encephalopathy in conjunction with her increased WOB may be due to a hypercarbic or hypoxemic encephalopathy.  5. Stage 3 lung CA.  6. STAT EEG shows no electrographic seizures or epileptiform discharges (preliminary read by Dr. Hortense Ramal).   Recommendations: 1. Empiric ABX: Vancomycin, ceftriaxone, ampicillin and acyclovir. Pharmacy to dose given ESRD. Appreciate Pharmacy assistance.  2. LP cannot be performed due to acute hydrocephalus. Herniation is a risk if depressurization occurs below the level of the 3rd ventricle without sufficient simultaneous drainage of fluid from 3rd and lateral ventricles, which would result in a pressure gradient and possible uncal or downward transtentorial herniation.  3. MRI brain has been ordered without contrast (  unable to do with contrast due to  renal failure)  4. STAT ABG 5. Neurosurgery consult for possible EVD to treat hydrocephalus. Will also need to send about 10 cc of CSF to the lab for the following: Cell count with differential, glucose, protein, gram stain, fungal stain, bacterial culture, fungal culture, HSV PCR and cryptococcal antigen. Neurosurgery service has been called. 6. Will need to be transferred to the ICU.   65 minutes spent in the emergent neurological evaluation and management of this critically ill patient. Time spent included review of imaging and coordination of care.   Addendum: Family has elected to change the patient's status to comfort care.   Electronically signed: Dr. Kerney Elbe 2019-04-09, 9:34 AM

## 2019-04-14 DEATH — deceased

## 2019-04-15 ENCOUNTER — Other Ambulatory Visit: Payer: Medicare Other

## 2019-04-15 ENCOUNTER — Ambulatory Visit: Payer: Medicare Other

## 2019-04-15 ENCOUNTER — Ambulatory Visit: Payer: Medicare Other | Admitting: Internal Medicine

## 2019-04-16 ENCOUNTER — Ambulatory Visit: Payer: Medicare Other

## 2019-04-17 ENCOUNTER — Ambulatory Visit: Payer: Medicare Other

## 2019-04-18 ENCOUNTER — Ambulatory Visit: Payer: Medicare Other

## 2019-04-21 ENCOUNTER — Ambulatory Visit: Payer: Medicare Other

## 2019-04-22 ENCOUNTER — Ambulatory Visit: Payer: Medicare Other

## 2019-04-22 ENCOUNTER — Ambulatory Visit: Payer: Self-pay | Admitting: Nurse Practitioner

## 2019-04-23 ENCOUNTER — Ambulatory Visit: Payer: Medicare Other

## 2019-04-24 ENCOUNTER — Ambulatory Visit: Payer: Medicare Other

## 2019-04-25 ENCOUNTER — Ambulatory Visit: Payer: Medicare Other

## 2019-04-28 ENCOUNTER — Ambulatory Visit: Payer: Medicare Other

## 2019-04-29 ENCOUNTER — Ambulatory Visit: Payer: Medicare Other

## 2019-04-30 ENCOUNTER — Ambulatory Visit: Payer: Medicare Other

## 2019-05-01 ENCOUNTER — Ambulatory Visit: Payer: Medicare Other

## 2019-05-02 ENCOUNTER — Ambulatory Visit: Payer: Medicare Other

## 2020-01-21 ENCOUNTER — Ambulatory Visit: Payer: Medicare Other

## 2020-01-21 ENCOUNTER — Ambulatory Visit: Payer: Medicare Other | Admitting: Nurse Practitioner

## 2021-07-03 IMAGING — CT CT RENAL STONE PROTOCOL
2 of 4 series · 16 of 46 positions shown, 18 images · non-contrast
Comparison: None.

CLINICAL DATA: Flank pain, generalized weakness

EXAM:
CT ABDOMEN AND PELVIS WITHOUT CONTRAST
TECHNIQUE: Multidetector CT imaging of the abdomen and pelvis was performed
following the standard protocol without IV contrast.

[Series 3: ap without · axial · non-contrast · 0.71mm/px · z∈[+735,+1125]mm · 13 of 88 slices shown, 15 images]
[im 5/88  soft-tissue]
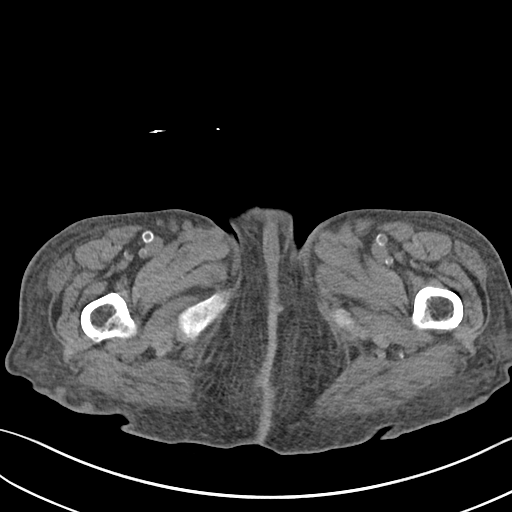
[im 5/88  bone]
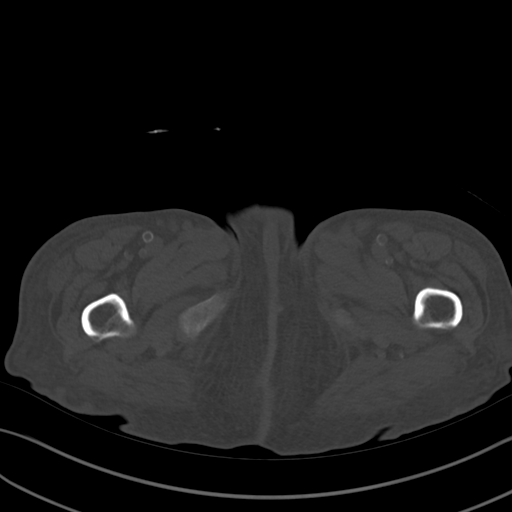
[im 14/88  soft-tissue]
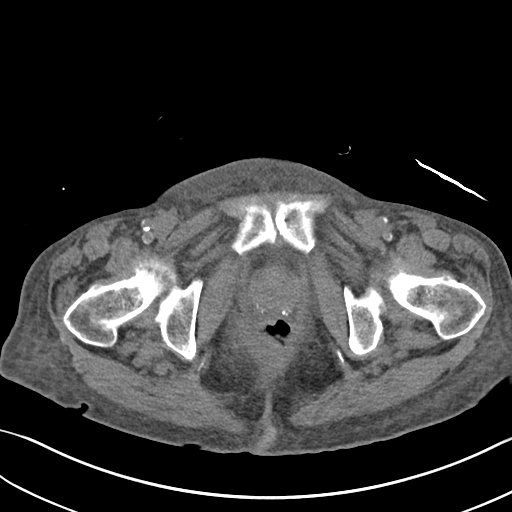
[im 18/88  soft-tissue]
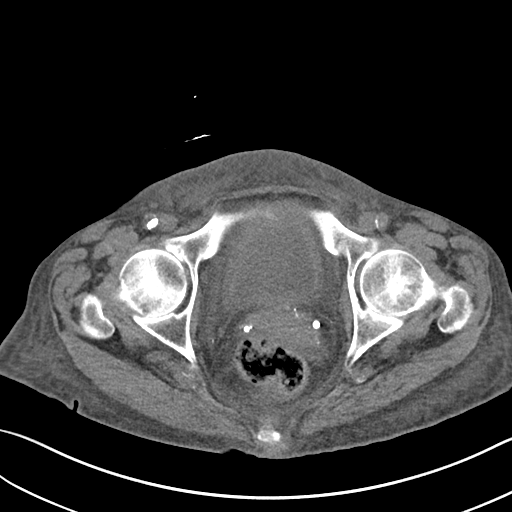
[im 27/88  soft-tissue]
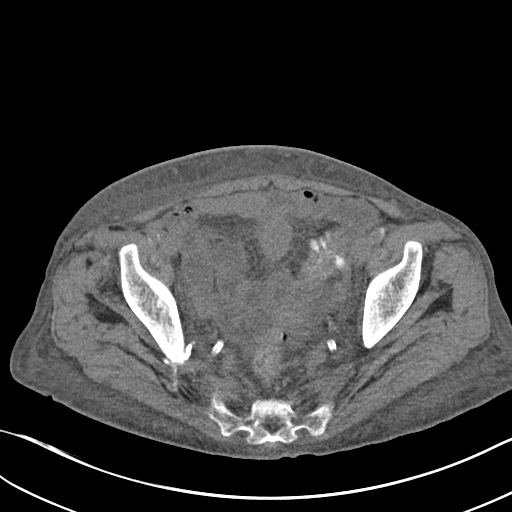
[im 31/88  soft-tissue]
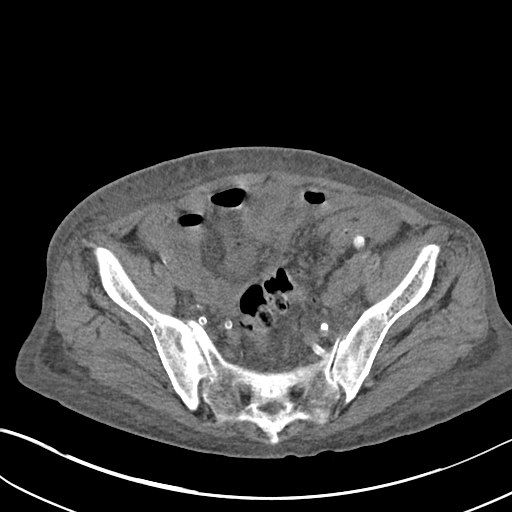
[im 40/88  soft-tissue]
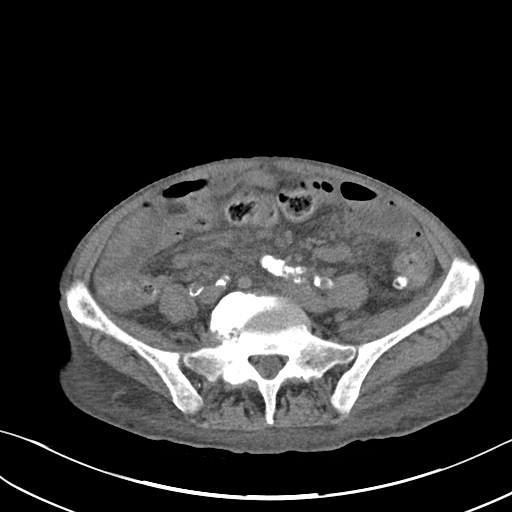
[im 44/88  soft-tissue]
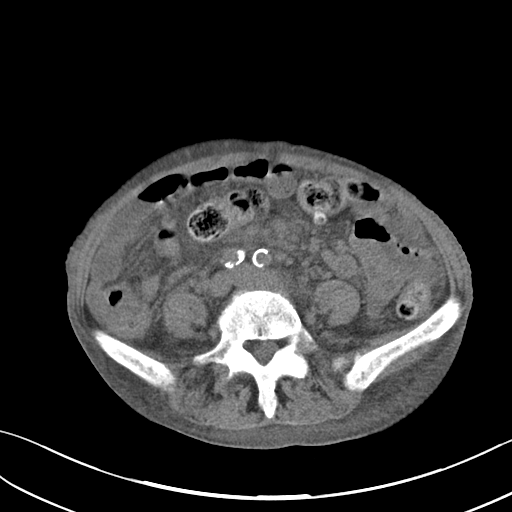
[im 48/88  soft-tissue]
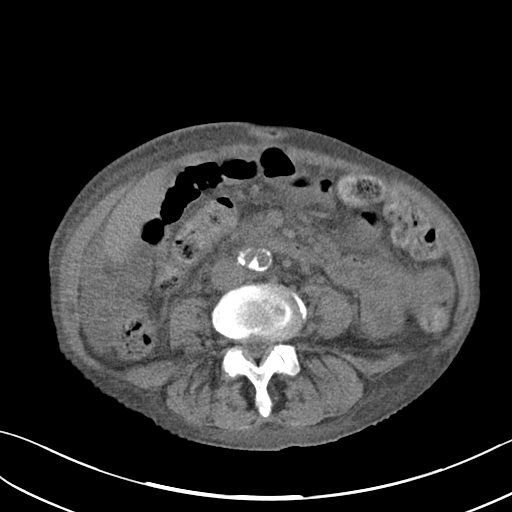
[im 57/88  soft-tissue]
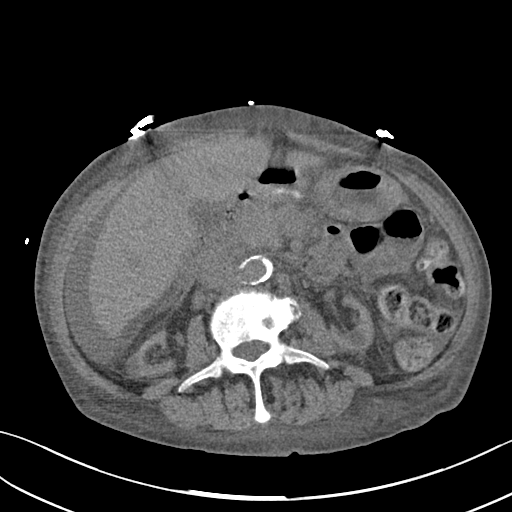
[im 57/88  bone]
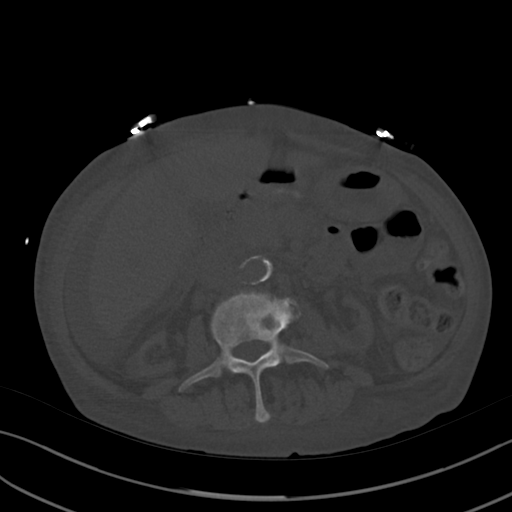
[im 61/88  soft-tissue]
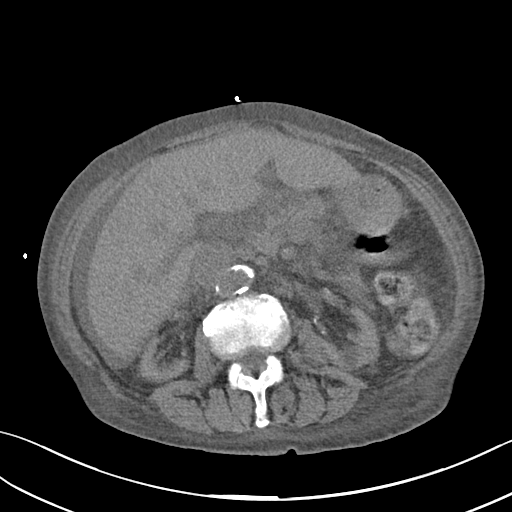
[im 70/88  soft-tissue]
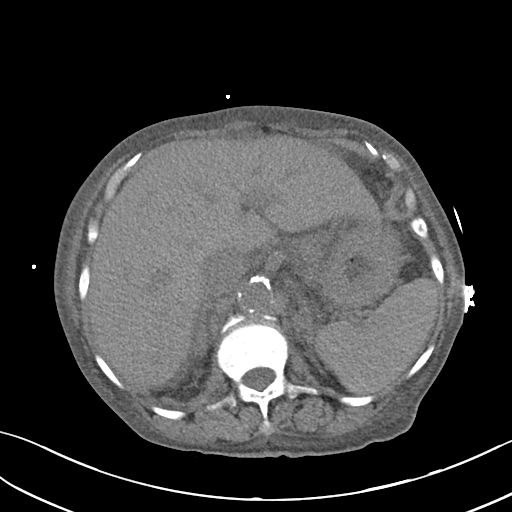
[im 74/88  soft-tissue]
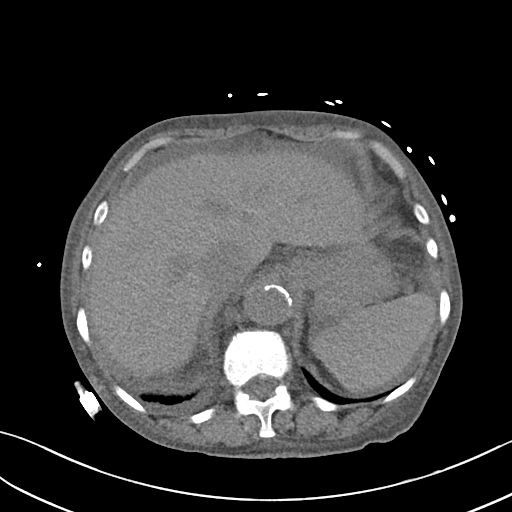
[im 83/88  soft-tissue]
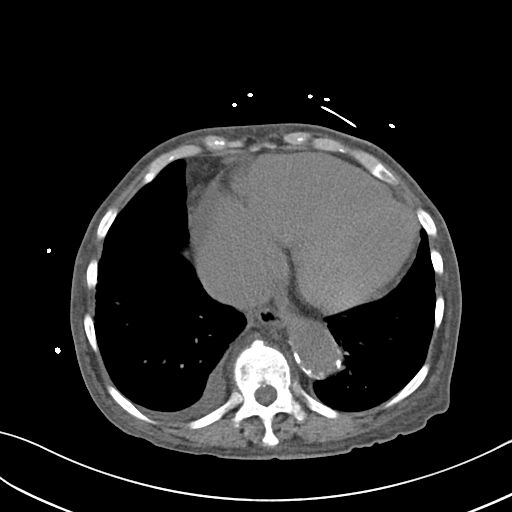

[Series 6: cor · coronal · 0.70mm/px · 3 of 87 slices shown]
[im 29/87  soft-tissue]
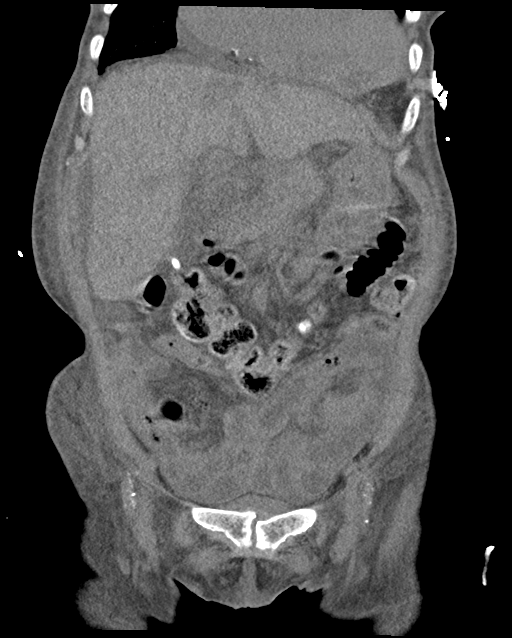
[im 39/87  soft-tissue]
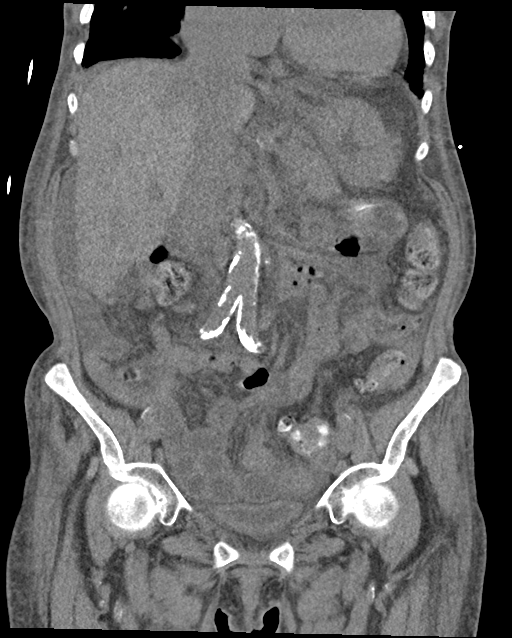
[im 48/87  soft-tissue]
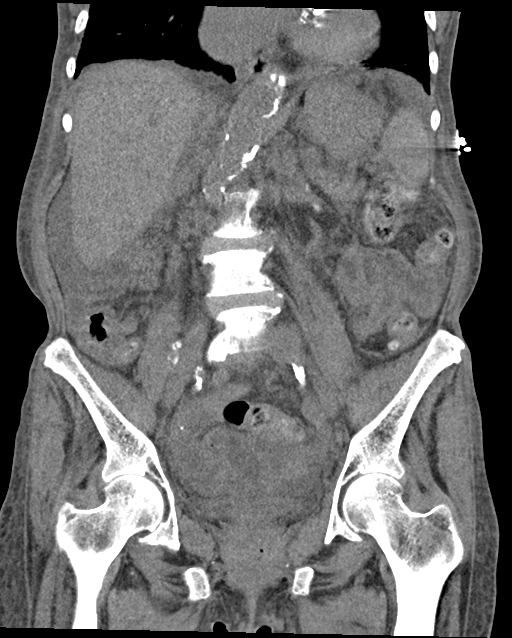

[16 of 46 positions shown; findings below may reference images not displayed]

FINDINGS: Lower chest: Small right pleural effusion. No left pleural effusion.
Cardiomegaly.

Hepatobiliary: No focal hepatic mass. Calcification along the
inferior margin of the gallbladder which may reflect a gallstone or
volume averaging from the adjacent small bowel. No intrahepatic
biliary ductal dilatation.

Pancreas: Unremarkable. No pancreatic ductal dilatation or
surrounding inflammatory changes.

Spleen: Normal in size without focal abnormality.

Adrenals/Urinary Tract: Indeterminate 9 mm left adrenal nodule.
Severe atrophy of bilateral kidneys. No urolithiasis or obstructive
uropathy. Decompressed bladder.

Stomach/Bowel: No bowel dilatation to suggest obstruction. Mild
small bowel wall thickening in the left side of the abdomen as can
be seen with enteritis secondary to an infectious or inflammatory
etiology. No pneumatosis, pneumoperitoneum or portal venous gas.
Diverticulosis without evidence of diverticulitis.

Vascular/Lymphatic: Normal caliber abdominal aorta with mild
atherosclerosis. No lymphadenopathy.

Reproductive: Status post hysterectomy. No adnexal masses.

Other: No fluid collection or hematoma.  Anasarca is present.

Musculoskeletal: No acute osseous abnormality. No aggressive osseous
lesion. Degenerative disease with disc height loss throughout the
lumbar spine. Schmorl's node at L3 and L4.
IMPRESSION: 1. Mild small bowel wall thickening in the left side of the abdomen
as can be seen with enteritis secondary to an infectious or
inflammatory etiology.
2. Generalized anasarca.
3. Small right pleural effusion.
4. Diverticulosis without evidence of diverticulitis.
5. No bowel obstruction.
